# Patient Record
Sex: Female | Born: 1970 | Race: White | Hispanic: No
Health system: Southern US, Academic
[De-identification: ages and names within clinical notes are randomized; demographics above are authoritative.]

## PROBLEM LIST (undated history)

## (undated) ENCOUNTER — Emergency Department (HOSPITAL_BASED_OUTPATIENT_CLINIC_OR_DEPARTMENT_OTHER): Admission: EM | Payer: 59 | Source: Home / Self Care

## (undated) ENCOUNTER — Emergency Department: Discharge status: Absent without leave | Payer: Self-pay

## (undated) ENCOUNTER — Telehealth

## (undated) ENCOUNTER — Encounter

## (undated) ENCOUNTER — Encounter: Attending: Gastroenterology | Primary: Gastroenterology

## (undated) ENCOUNTER — Telehealth: Attending: Clinical | Primary: Clinical

## (undated) ENCOUNTER — Encounter
Attending: Student in an Organized Health Care Education/Training Program | Primary: Student in an Organized Health Care Education/Training Program

## (undated) ENCOUNTER — Ambulatory Visit: Payer: PRIVATE HEALTH INSURANCE

## (undated) ENCOUNTER — Ambulatory Visit

## (undated) ENCOUNTER — Encounter: Attending: Family Medicine | Primary: Family Medicine

## (undated) ENCOUNTER — Telehealth: Attending: Certified Registered" | Primary: Certified Registered"

## (undated) ENCOUNTER — Ambulatory Visit
Attending: Student in an Organized Health Care Education/Training Program | Primary: Student in an Organized Health Care Education/Training Program

## (undated) ENCOUNTER — Telehealth: Attending: Gastroenterology | Primary: Gastroenterology

## (undated) ENCOUNTER — Ambulatory Visit: Payer: PRIVATE HEALTH INSURANCE | Attending: Clinical | Primary: Clinical

## (undated) ENCOUNTER — Encounter: Attending: Internal Medicine | Primary: Internal Medicine

## (undated) ENCOUNTER — Inpatient Hospital Stay

## (undated) ENCOUNTER — Encounter: Attending: Certified Registered" | Primary: Certified Registered"

## (undated) ENCOUNTER — Ambulatory Visit: Payer: PRIVATE HEALTH INSURANCE | Attending: Psychiatry | Primary: Psychiatry

## (undated) ENCOUNTER — Ambulatory Visit
Payer: PRIVATE HEALTH INSURANCE | Attending: Student in an Organized Health Care Education/Training Program | Primary: Student in an Organized Health Care Education/Training Program

## (undated) ENCOUNTER — Institutional Professional Consult (permissible substitution): Payer: PRIVATE HEALTH INSURANCE

## (undated) ENCOUNTER — Ambulatory Visit: Attending: Gastroenterology | Primary: Gastroenterology

## (undated) ENCOUNTER — Encounter: Attending: Physician Assistant | Primary: Physician Assistant

## (undated) ENCOUNTER — Encounter: Attending: Psychologist | Primary: Psychologist

## (undated) ENCOUNTER — Telehealth: Attending: Psychiatry | Primary: Psychiatry

## (undated) ENCOUNTER — Encounter: Attending: Psychiatry | Primary: Psychiatry

## (undated) ENCOUNTER — Ambulatory Visit: Payer: PRIVATE HEALTH INSURANCE | Attending: Health Service | Primary: Health Service

## (undated) DIAGNOSIS — Z973 Presence of spectacles and contact lenses: Secondary | ICD-10-CM

## (undated) DIAGNOSIS — I10 Essential (primary) hypertension: Secondary | ICD-10-CM

## (undated) DIAGNOSIS — A499 Bacterial infection, unspecified: Secondary | ICD-10-CM

## (undated) DIAGNOSIS — Z951 Presence of aortocoronary bypass graft: Secondary | ICD-10-CM

## (undated) DIAGNOSIS — R32 Unspecified urinary incontinence: Secondary | ICD-10-CM

## (undated) DIAGNOSIS — M1990 Unspecified osteoarthritis, unspecified site: Secondary | ICD-10-CM

## (undated) DIAGNOSIS — I2510 Atherosclerotic heart disease of native coronary artery without angina pectoris: Secondary | ICD-10-CM

## (undated) DIAGNOSIS — M545 Low back pain, unspecified: Secondary | ICD-10-CM

## (undated) DIAGNOSIS — K219 Gastro-esophageal reflux disease without esophagitis: Secondary | ICD-10-CM

## (undated) DIAGNOSIS — Z9889 Other specified postprocedural states: Secondary | ICD-10-CM

## (undated) DIAGNOSIS — G8929 Other chronic pain: Secondary | ICD-10-CM

## (undated) DIAGNOSIS — K589 Irritable bowel syndrome without diarrhea: Secondary | ICD-10-CM

## (undated) DIAGNOSIS — R112 Nausea with vomiting, unspecified: Secondary | ICD-10-CM

## (undated) DIAGNOSIS — N390 Urinary tract infection, site not specified: Secondary | ICD-10-CM

## (undated) DIAGNOSIS — K743 Primary biliary cirrhosis: Secondary | ICD-10-CM

## (undated) DIAGNOSIS — R188 Other ascites: Secondary | ICD-10-CM

## (undated) DIAGNOSIS — E7849 Other hyperlipidemia: Secondary | ICD-10-CM

## (undated) HISTORY — PX: INCONTINENCE SURGERY: SHX676

## (undated) HISTORY — PX: LIVER BIOPSY: SHX301

## (undated) HISTORY — PX: LIVER TRANSPLANT: SHX410

## (undated) HISTORY — PX: WISDOM TOOTH EXTRACTION: SHX21

## (undated) MED ORDER — DEKAS ESSENTIAL 2,000 UNIT-2,000 UNIT-1,000 MCG CAPSULE: Freq: Every day | ORAL | 0.00000 days

## (undated) MED ORDER — PRALUENT PEN 150 MG/ML SUBCUTANEOUS PEN INJECTOR: SUBCUTANEOUS | 0 days

## (undated) MED ORDER — ACETAMINOPHEN 500 MG TABLET: Freq: Three times a day (TID) | ORAL | 0.00000 days | PRN

## (undated) MED ORDER — MIRTAZAPINE 30 MG TABLET: Freq: Every evening | ORAL | 0 days | Status: SS

## (undated) MED ORDER — OXYCODONE 5 MG CAPSULE: Freq: Every day | ORAL | 0 days | PRN

## (undated) MED ORDER — LORATADINE 10 MG TABLET: Freq: Every day | ORAL | 0.00000 days

## (undated) MED ORDER — ONDANSETRON 8 MG DISINTEGRATING TABLET: Freq: Three times a day (TID) | ORAL | 0 days | PRN

## (undated) MED ORDER — BIOTIN 5 MG CAPSULE: Freq: Every day | ORAL | 0.00000 days | Status: SS

## (undated) MED ORDER — CYCLOSPORINE 0.05 % EYE DROPS IN A DROPPERETTE: Freq: Every day | OPHTHALMIC | 0.00000 days

---

## 1998-02-04 ENCOUNTER — Inpatient Hospital Stay (HOSPITAL_COMMUNITY): Admission: AD | Admit: 1998-02-04 | Discharge: 1998-02-04 | Payer: Self-pay | Admitting: *Deleted

## 1998-04-21 ENCOUNTER — Inpatient Hospital Stay (HOSPITAL_COMMUNITY): Admission: AD | Admit: 1998-04-21 | Discharge: 1998-04-21 | Payer: Self-pay | Admitting: Obstetrics & Gynecology

## 1998-04-23 ENCOUNTER — Inpatient Hospital Stay (HOSPITAL_COMMUNITY): Admission: AD | Admit: 1998-04-23 | Discharge: 1998-04-25 | Payer: Self-pay | Admitting: *Deleted

## 1998-04-27 ENCOUNTER — Encounter (HOSPITAL_COMMUNITY): Admission: RE | Admit: 1998-04-27 | Discharge: 1998-07-26 | Payer: Self-pay | Admitting: *Deleted

## 1998-08-06 ENCOUNTER — Encounter (HOSPITAL_COMMUNITY): Admission: RE | Admit: 1998-08-06 | Discharge: 1998-11-04 | Payer: Self-pay | Admitting: *Deleted

## 1998-08-23 ENCOUNTER — Ambulatory Visit (HOSPITAL_COMMUNITY): Admission: RE | Admit: 1998-08-23 | Discharge: 1998-08-23 | Payer: Self-pay | Admitting: Orthopedic Surgery

## 1998-08-23 ENCOUNTER — Encounter: Payer: Self-pay | Admitting: Orthopedic Surgery

## 2000-06-04 ENCOUNTER — Encounter: Payer: Self-pay | Admitting: *Deleted

## 2000-06-04 ENCOUNTER — Encounter: Admission: RE | Admit: 2000-06-04 | Discharge: 2000-06-04 | Payer: Self-pay | Admitting: *Deleted

## 2000-12-07 ENCOUNTER — Encounter: Payer: Self-pay | Admitting: Otolaryngology

## 2000-12-07 ENCOUNTER — Ambulatory Visit (HOSPITAL_COMMUNITY): Admission: RE | Admit: 2000-12-07 | Discharge: 2000-12-07 | Payer: Self-pay | Admitting: Otolaryngology

## 2002-08-29 ENCOUNTER — Inpatient Hospital Stay (HOSPITAL_COMMUNITY): Admission: AD | Admit: 2002-08-29 | Discharge: 2002-09-01 | Payer: Self-pay | Admitting: *Deleted

## 2002-09-02 ENCOUNTER — Encounter: Admission: RE | Admit: 2002-09-02 | Discharge: 2002-10-02 | Payer: Self-pay | Admitting: Obstetrics and Gynecology

## 2002-10-03 ENCOUNTER — Encounter: Admission: RE | Admit: 2002-10-03 | Discharge: 2002-11-02 | Payer: Self-pay | Admitting: Obstetrics and Gynecology

## 2002-10-10 ENCOUNTER — Other Ambulatory Visit: Admission: RE | Admit: 2002-10-10 | Discharge: 2002-10-10 | Payer: Self-pay | Admitting: *Deleted

## 2002-11-03 ENCOUNTER — Encounter: Admission: RE | Admit: 2002-11-03 | Discharge: 2002-12-03 | Payer: Self-pay | Admitting: Obstetrics and Gynecology

## 2003-11-07 ENCOUNTER — Other Ambulatory Visit: Admission: RE | Admit: 2003-11-07 | Discharge: 2003-11-07 | Payer: Self-pay | Admitting: Obstetrics and Gynecology

## 2003-12-27 ENCOUNTER — Encounter: Admission: RE | Admit: 2003-12-27 | Discharge: 2003-12-27 | Payer: Self-pay | Admitting: Rheumatology

## 2003-12-31 ENCOUNTER — Ambulatory Visit (HOSPITAL_COMMUNITY): Admission: RE | Admit: 2003-12-31 | Discharge: 2003-12-31 | Payer: Self-pay | Admitting: Emergency Medicine

## 2004-01-15 ENCOUNTER — Emergency Department (HOSPITAL_COMMUNITY): Admission: EM | Admit: 2004-01-15 | Discharge: 2004-01-15 | Payer: Self-pay | Admitting: Emergency Medicine

## 2004-08-22 ENCOUNTER — Ambulatory Visit (HOSPITAL_COMMUNITY): Admission: RE | Admit: 2004-08-22 | Discharge: 2004-08-22 | Payer: Self-pay | Admitting: Gastroenterology

## 2004-08-22 ENCOUNTER — Encounter (INDEPENDENT_AMBULATORY_CARE_PROVIDER_SITE_OTHER): Payer: Self-pay | Admitting: *Deleted

## 2004-12-05 ENCOUNTER — Other Ambulatory Visit: Admission: RE | Admit: 2004-12-05 | Discharge: 2004-12-05 | Payer: Self-pay | Admitting: Obstetrics and Gynecology

## 2005-05-23 ENCOUNTER — Encounter: Admission: RE | Admit: 2005-05-23 | Discharge: 2005-05-23 | Payer: Self-pay | Admitting: Gastroenterology

## 2006-06-05 ENCOUNTER — Ambulatory Visit (HOSPITAL_BASED_OUTPATIENT_CLINIC_OR_DEPARTMENT_OTHER): Admission: RE | Admit: 2006-06-05 | Discharge: 2006-06-05 | Payer: Self-pay | Admitting: Rheumatology

## 2006-06-07 ENCOUNTER — Ambulatory Visit: Payer: Self-pay | Admitting: Internal Medicine

## 2006-07-14 ENCOUNTER — Encounter: Admission: RE | Admit: 2006-07-14 | Discharge: 2006-07-14 | Payer: Self-pay | Admitting: Family Medicine

## 2007-02-25 HISTORY — PX: INCONTINENCE SURGERY: SHX676

## 2007-09-16 ENCOUNTER — Inpatient Hospital Stay (HOSPITAL_COMMUNITY): Admission: RE | Admit: 2007-09-16 | Discharge: 2007-09-17 | Payer: Self-pay | Admitting: Obstetrics and Gynecology

## 2007-09-20 ENCOUNTER — Inpatient Hospital Stay (HOSPITAL_COMMUNITY): Admission: AD | Admit: 2007-09-20 | Discharge: 2007-09-21 | Payer: Self-pay | Admitting: Obstetrics and Gynecology

## 2008-02-15 ENCOUNTER — Emergency Department (HOSPITAL_COMMUNITY): Admission: EM | Admit: 2008-02-15 | Discharge: 2008-02-16 | Payer: Self-pay | Admitting: Emergency Medicine

## 2008-06-10 ENCOUNTER — Emergency Department (HOSPITAL_COMMUNITY): Admission: EM | Admit: 2008-06-10 | Discharge: 2008-06-10 | Payer: Self-pay | Admitting: Emergency Medicine

## 2008-06-14 ENCOUNTER — Ambulatory Visit (HOSPITAL_COMMUNITY): Admission: RE | Admit: 2008-06-14 | Discharge: 2008-06-14 | Payer: Self-pay | Admitting: Family Medicine

## 2010-04-19 ENCOUNTER — Other Ambulatory Visit: Payer: Self-pay | Admitting: Gastroenterology

## 2010-04-23 ENCOUNTER — Ambulatory Visit
Admission: RE | Admit: 2010-04-23 | Discharge: 2010-04-23 | Disposition: A | Discharge status: Home / Self Care | Payer: Managed Care, Other (non HMO) | Source: Ambulatory Visit | Attending: Gastroenterology | Admitting: Gastroenterology

## 2010-06-05 LAB — DIFFERENTIAL
Lymphocytes Relative: 20 % (ref 12–46)
Monocytes Absolute: 0.9 10*3/uL (ref 0.1–1.0)
Monocytes Relative: 8 % (ref 3–12)
Neutrophils Relative %: 71 % (ref 43–77)

## 2010-06-05 LAB — URINALYSIS, ROUTINE W REFLEX MICROSCOPIC
Ketones, ur: NEGATIVE mg/dL
Nitrite: NEGATIVE
Protein, ur: NEGATIVE mg/dL
Urobilinogen, UA: 1 mg/dL (ref 0.0–1.0)

## 2010-06-05 LAB — HEPATIC FUNCTION PANEL
AST: 82 U/L — ABNORMAL HIGH (ref 0–37)
Bilirubin, Direct: 0.5 mg/dL — ABNORMAL HIGH (ref 0.0–0.3)
Total Bilirubin: 1 mg/dL (ref 0.3–1.2)

## 2010-06-05 LAB — BASIC METABOLIC PANEL
CO2: 28 mEq/L (ref 19–32)
Chloride: 100 mEq/L (ref 96–112)
Potassium: 3.6 mEq/L (ref 3.5–5.1)

## 2010-06-05 LAB — CBC
MCHC: 35.4 g/dL (ref 30.0–36.0)
Platelets: 295 10*3/uL (ref 150–400)
RDW: 13 % (ref 11.5–15.5)
WBC: 10.6 10*3/uL — ABNORMAL HIGH (ref 4.0–10.5)

## 2010-06-05 LAB — HEPARIN LEVEL (UNFRACTIONATED): Heparin Unfractionated: <0.1 IU/mL — ABNORMAL LOW (ref 0.30–0.70)

## 2010-06-05 LAB — URINE CULTURE: Culture: NO GROWTH

## 2010-07-09 NOTE — Op Note (Signed)
NAMEFLYNN, LININGER                ACCOUNT NO.:  [id number]   MEDICAL RECORD NO.:  [id number]          PATIENT TYPE:  INP   LOCATION:  9318                          FACILITY:  [facility]   PHYSICIAN:  Martina Sinner, MD DATE OF BIRTH:  06/18/1970   DATE OF PROCEDURE:  09/16/2007  DATE OF DISCHARGE:                               OPERATIVE REPORT   PREOPERATIVE DIAGNOSIS:  Stress urinary continence.   POSTOPERATIVE DIAGNOSIS:  Stress urinary continence.   SURGERY:  Sling cystourethropexy [facility]) plus cystoscopy.   SURGEON:  Scott A. MacDiarmid, MD.   Threasa HeadsStann Mainland. Vincente Poli, MD.   [name] has stress urinary continence.  She was booked to have a  sling plus rectocele repair.  Rectocele repair was performed following  the sling by Dr. Marcelle Overlie.   The patient was placed in lithotomy position.  Extra care was taken to  minimize the risk of compartment syndrome, neuropathy, and DVT.  She was  given preoperative antibiotics.  Preoperative laboratory tests were  normal.  A weighted vaginal speculum, Kerr cerebellar retractor, and  Foley catheter was used for exposure.  Two 1-cm incisions were made 1.5  cm lateral to the midline and 1 fingerbreadth above the symphysis pubis.  I made a 2-cm incision overlying the mid urethra.  I used a lidocaine  and epinephrine mixture to place submucosally to make certain I could  make a deep incision.  I sharply dissected with Metzenbaum through  urethrovesical angle bilaterally.   With the bladder empty, I passed the [facility] needle on top of along the  back of symphysis pubis onto the pulp of my index finger bilaterally.   I then cystoscoped the patient.  There was no injury to the bladder or  urethra or ureters.  There was bilateral efflux.  She did have a mild  cystocele.  There was no movement of the bladder wall with wiggling of  the trocars.  I took extra time to inspect the full bladder when it was  full.   With the bladder  empty, I attached the sling and brought up through the  retropubic space and tensioned it over the fat part of the moderate-  sized Kelly clamp.  I cut below the blue dots, irrigated the sheath, and  removed the sheaths.   I was very happy with the position and tension of the sling.  There was  hypermobility.  I closed the incision with running 2-0 Vicryl followed  by 2 interrupted sutures.   Copious irrigation was utilized.  The sling was cut below the skin level  and closed with 4-0 Vicryl and Dermabond.   The rectocele repair was then performed by Dr. Marcelle Overlie.   Hopefully, Ms. Vanengen will meet her treatment goal with the sling.           ______________________________  Martina Sinner, MD  Electronically Signed     SAM/MEDQ  D:  09/16/2007  T:  09/16/2007  Job:  [id number]

## 2010-07-09 NOTE — Op Note (Signed)
NAMETAQUILA, Christine Butler                ACCOUNT NO.:  [id number]   MEDICAL RECORD NO.:  [id number]          PATIENT TYPE:  INP   LOCATION:  9318                          FACILITY:  [facility]   PHYSICIAN:  Michelle L. Grewal, M.D.DATE OF BIRTH:  24-Feb-1971   DATE OF PROCEDURE:  09/16/2007  DATE OF DISCHARGE:                               OPERATIVE REPORT   PREOPERATIVE DIAGNOSIS:  Symptomatic rectocele and stress incontinence.   POSTOPERATIVE DIAGNOSIS:  Symptomatic rectocele and stress incontinence.   PROCEDURE:  Posterior repair perineoplasty.   SURGEON:  Michelle L. Vincente Poli, MD   ASSISTANT:  Martina Sinner, MD   ANESTHESIA:  General.   FINDINGS:  Rectocele.   SPECIMENS:  None.   PATHOLOGY:  Not applicable.   ESTIMATED BLOOD LOSS:  About 50 mL.   PROCEDURE:  The patient was taken to the operating room after informed  consent was obtained.  She was prepped and draped in the usual sterile  fashion.  Dr. Sherron Monday performed a SPARC sling and cystoscopy and at  the conclusion of that I then switched places with him and noted the  patient had a grade 2 rectocele.  She also had  some relaxation of  perineum.  I placed the Allis clamps at 5 and 7 o'clock, made a V-shaped  incision with scalpel, and made a midline incision up to posterior wall  of the vagina with Metzenbaum scissors.  I dissected the rectovaginal  fascia free and then reapproximated rectovaginal fascia in the midline  and reduced the rectocele using interrupted using 0 Vicryl suture.  I  then trimmed the redundant vaginal epithelium and then closed the  vaginal epithelium and the perineum, was reapproximated using 0 Vicryl  and a running stitch.  Vaginal packing with  Estrace cream was inserted  into the vagina.  All sponge, lap, and instrument counts were correct  x2.  The patient went to the recovery room in stable condition.  She  will be kept overnight for observation.      Michelle L. Vincente Poli, M.D.  Electronically Signed     MLG/MEDQ  D:  09/16/2007  T:  09/16/2007  Job:  [id number]

## 2010-07-12 NOTE — Op Note (Signed)
   NAMEPORFIRIA, HEINRICH                          ACCOUNT NO.:  [id number]   MEDICAL RECORD NO.:  [id number]                   PATIENT TYPE:  INP   LOCATION:  9164                                 FACILITY:  [facility]   PHYSICIAN:  Duke Salvia. Marcelle Overlie, M.D.            DATE OF BIRTH:  Jul 25, 1970   DATE OF PROCEDURE:  08/30/2002  DATE OF DISCHARGE:                                 OPERATIVE REPORT   OBSTETRICIAN:  Duke Salvia. Marcelle Overlie, M.D.   DESCRIPTION OF PROCEDURE:  The patient had a short second stage with deep  variables. She was known to be +3 and starting to crown with good epidural  anesthesia, straight OA with the fetal heart rate staying in the 60-70  range. Decision made to proceed with VE assisted delivery. The Kiwi VE was  applied. Traction effort coordinated with maternal pushing x1 to effect the  easy delivery of a female.  APGARS 9/9, pH is pending. Placenta delivered  spontaneously intact. Pitocin was given. At that point, EBL was 350.   There was small right periurethral laceration and a small second degree  perineal tear repaired with 3-0 Vicryl Rapide. Mother and baby doing well at  that point.                                               Richard M. Marcelle Overlie, M.D.    RMH/MEDQ  D:  08/30/2002  T:  08/30/2002  Job:  [id number]

## 2010-07-12 NOTE — Procedures (Signed)
Christine Butler, Christine Butler                ACCOUNT NO.:  [id number]   MEDICAL RECORD NO.:  [id number]          PATIENT TYPE:  OUT   LOCATION:  SLEEP CENTER                 FACILITY:  [facility]   PHYSICIAN:  Clinton D. Maple Hudson, MD, FCCP, FACPDATE OF BIRTH:  [birth year]1970   DATE OF STUDY:  06/05/2006                            NOCTURNAL POLYSOMNOGRAM   REFERRING PHYSICIAN:  Pollyann Savoy, M.D.   INDICATION FOR STUDY:  Insomnia with sleep apnea.   EPWORTH SLEEPINESS SCORE:  12/24, BMI 30.8, weight 170 pounds.   MEDICATIONS:  Medication list reviewed and charted.   SLEEP ARCHITECTURE:  Total sleep time 373 minutes with sleep efficiency  96%.  Stage I was 4%, stage II 71%, stages III and IV 8%, REM 17% of  total sleep time.  Sleep latency 8 minutes, REM latency 78 minutes,  awake after sleep onset 10 minutes, arousal index 4.7.  Rozerem was  taken at 10:50 p.m., Actigall taken at 9:15 p.m.   RESPIRATORY DATA:  Apnea-hypopnea index (AHI, RDI) 0 per hour.  There  were no scored respiratory disturbance events.   OXYGEN DATA:  Mild occasional snoring with oxygen desaturation to a  nadir of 93%.  Mean oxygen saturation through the study was 97% on room  air.   CARDIAC DATA:  Sinus rhythm with mild sinus arrhythmia.   MOVEMENT-PARASOMNIA:  Occasional limb jerk with little effect on sleep.  She slept mostly on left and right side.   IMPRESSIONS-RECOMMENDATIONS:  1. Unremarkable sleep architecture for sleep center environment, after      Rozerem taken at 10:50 p.m., with sleep onset at 11:14 p.m. (lights      out 11:05 p.m.).  2. No sleep disordered breathing events, mild occasional snoring and      oxygen desaturation to a nadir of 93%.  3. Mild occasional sinus arrhythmia, otherwise normal sinus rhythm on      EKG.     Clinton D. Maple Hudson, MD, [facility], FACP  Diplomate, Biomedical engineer of Sleep Medicine  Electronically Signed    CDY/MEDQ  D:  06/07/2006 10:23:37  T:  06/07/2006 11:18:57  Job:   [id number]

## 2010-11-22 LAB — DIFFERENTIAL
Basophils Absolute: 0
Basophils Relative: 1
Monocytes Relative: 9
Neutro Abs: 4.9
Neutrophils Relative %: 61

## 2010-11-22 LAB — TYPE AND SCREEN
ABO/RH(D): A POS
Antibody Screen: NEGATIVE

## 2010-11-22 LAB — URINALYSIS, ROUTINE W REFLEX MICROSCOPIC
Glucose, UA: NEGATIVE
Hgb urine dipstick: NEGATIVE
Ketones, ur: NEGATIVE
pH: 7

## 2010-11-22 LAB — CBC
HCT: 33.2 — ABNORMAL LOW
Hemoglobin: 11.3 — ABNORMAL LOW
Hemoglobin: 14.8
MCHC: 34.1
MCHC: 34.6
MCV: 93.1
RBC: 3.57 — ABNORMAL LOW
RBC: 3.88
RBC: 4.7
RDW: 12.5
WBC: 5.9
WBC: 9.4

## 2010-11-22 LAB — URINE CULTURE
Colony Count: NO GROWTH
Culture: NO GROWTH

## 2010-11-22 LAB — APTT: aPTT: 28

## 2010-11-22 LAB — ABO/RH: ABO/RH(D): A POS

## 2010-11-29 LAB — POCT CARDIAC MARKERS
CKMB, poc: 1.5 ng/mL (ref 1.0–8.0)
CKMB, poc: <1 ng/mL — ABNORMAL LOW (ref 1.0–8.0)
Myoglobin, poc: 59 ng/mL (ref 12–200)
Troponin i, poc: <0.05 ng/mL (ref 0.00–0.09)

## 2010-11-29 LAB — BASIC METABOLIC PANEL
GFR calc non Af Amer: >60 mL/min (ref >60)
Potassium: 3.3 mEq/L — ABNORMAL LOW (ref 3.5–5.1)
Sodium: 139 mEq/L (ref 135–145)

## 2010-11-29 LAB — CBC
HCT: 39.2 % (ref 36.0–46.0)
Hemoglobin: 13.6 g/dL (ref 12.0–15.0)
RBC: 4.34 MIL/uL (ref 3.87–5.11)
WBC: 7.9 10*3/uL (ref 4.0–10.5)

## 2010-11-29 LAB — DIFFERENTIAL
Eosinophils Relative: 2 % (ref 0–5)
Lymphocytes Relative: 31 % (ref 12–46)
Lymphs Abs: 2.4 10*3/uL (ref 0.7–4.0)
Monocytes Absolute: 0.6 10*3/uL (ref 0.1–1.0)

## 2010-12-19 ENCOUNTER — Other Ambulatory Visit: Payer: Self-pay | Admitting: Obstetrics and Gynecology

## 2011-06-24 ENCOUNTER — Other Ambulatory Visit: Payer: Self-pay | Admitting: Obstetrics and Gynecology

## 2011-08-27 ENCOUNTER — Encounter (HOSPITAL_COMMUNITY): Payer: Self-pay | Admitting: Emergency Medicine

## 2011-08-27 ENCOUNTER — Emergency Department (HOSPITAL_COMMUNITY)
Admission: EM | Admit: 2011-08-27 | Discharge: 2011-08-27 | Disposition: A | Discharge status: Home / Self Care | Payer: Managed Care, Other (non HMO) | Attending: Emergency Medicine | Admitting: Emergency Medicine

## 2011-08-27 DIAGNOSIS — K922 Gastrointestinal hemorrhage, unspecified: Secondary | ICD-10-CM | POA: Insufficient documentation

## 2011-08-27 HISTORY — DX: Primary biliary cirrhosis: K74.3

## 2011-08-27 HISTORY — DX: Irritable bowel syndrome, unspecified: K58.9

## 2011-08-27 LAB — CBC WITH DIFFERENTIAL/PLATELET
Basophils Relative: 0 % (ref 0–1)
Eosinophils Absolute: 0.1 10*3/uL (ref 0.0–0.7)
Eosinophils Relative: 2 % (ref 0–5)
Hemoglobin: 13.2 g/dL (ref 12.0–15.0)
MCH: 32 pg (ref 26.0–34.0)
MCHC: 34.7 g/dL (ref 30.0–36.0)
MCV: 92 fL (ref 78.0–100.0)
Monocytes Relative: 9 % (ref 3–12)
Neutrophils Relative %: 44 % (ref 43–77)

## 2011-08-27 LAB — COMPREHENSIVE METABOLIC PANEL
Albumin: 3.7 g/dL (ref 3.5–5.2)
Alkaline Phosphatase: 495 U/L — ABNORMAL HIGH (ref 39–117)
BUN: 9 mg/dL (ref 6–23)
Calcium: 9.7 mg/dL (ref 8.4–10.5)
Creatinine, Ser: 0.48 mg/dL — ABNORMAL LOW (ref 0.50–1.10)
GFR calc Af Amer: >90 mL/min (ref >90)
Potassium: 3.7 mEq/L (ref 3.5–5.1)
Total Protein: 8.4 g/dL — ABNORMAL HIGH (ref 6.0–8.3)

## 2011-08-27 LAB — OCCULT BLOOD, POC DEVICE: Fecal Occult Bld: NEGATIVE

## 2011-08-27 MED ORDER — FAMOTIDINE 20 MG PO TABS
20.0000 mg | ORAL_TABLET | Freq: Once | ORAL | Status: AC
  Administered 2011-08-27: 20 mg via ORAL
  Filled 2011-08-27: qty 1

## 2011-08-27 NOTE — ED Provider Notes (Signed)
History     CSN: [id number]  Arrival date & time 08/27/11  1527   First MD Initiated Contact with Patient 08/27/11 1607      Chief Complaint  Patient presents with  . Abdominal Pain    (Consider location/radiation/quality/duration/timing/severity/associated sxs/prior treatment) HPI Comments: Hx of Primary Biliary Cirrhosis - under care of Dr. Madilyn Fireman - chronic transaminitis - has IBS since childhood - has frequent loose stools at baseline with cramping.  Stools over last few days have been loose to normal, tinged with blood and now with dark "sticky" stools.  Sx are intermittent, persistent over 2 days, and not associated with f/c/n/v but has some epigastric pain, dec appetite.  Deniesw light headedness and SOB.  No rashes, no dysuria.  No pain with BM's.  NSAIDs q week as needed.  On actagol for PBC.    Patient is a 41 y.o. female presenting with abdominal pain. The history is provided by the patient and medical records.  Abdominal Pain The primary symptoms of the illness include abdominal pain.    Past Medical History  Diagnosis Date  . IBS (irritable bowel syndrome)   . Primary biliary cirrhosis     Past Surgical History  Procedure Date  . Incontinence surgery     No family history on file.  History  Substance Use Topics  . Smoking status: Never Smoker   . Smokeless tobacco: Not on file  . Alcohol Use: 0.6 oz/week    1 Glasses of wine per week     for dinner    OB History    Grav Para Term Preterm Abortions TAB SAB Ect Mult Living                  Review of Systems  Gastrointestinal: Positive for abdominal pain.  All other systems reviewed and are negative.    Allergies  Codeine and Erythromycin  Home Medications   Current Outpatient Rx  Name Route Sig Dispense Refill  . CALCIUM + D PO Oral Take 1 tablet by mouth daily.    . OMEGA-3 FATTY ACIDS 1000 MG PO CAPS Oral Take 1 g by mouth daily.    . IBUPROFEN 200 MG PO TABS Oral Take 600 mg by mouth every 8  (eight) hours as needed. For pain.    . ADULT MULTIVITAMIN W/MINERALS CH Oral Take 1 tablet by mouth daily.    Marland Kitchen URSODIOL 300 MG PO CAPS Oral Take 600-900 mg by mouth 2 (two) times daily. 3 cap in am, 2 cap in pm    . VITAMIN B-12 1000 MCG PO TABS Oral Take 1,000 mcg by mouth daily.      BP 122/75  Pulse 74  Temp 98.4 F (36.9 C) (Oral)  Resp 18  SpO2 100%  LMP 08/19/2011  Physical Exam  Nursing note and vitals reviewed. Constitutional: She appears well-developed and well-nourished. No distress.  HENT:  Head: Normocephalic and atraumatic.  Mouth/Throat: Oropharynx is clear and moist. No oropharyngeal exudate.  Eyes: Conjunctivae and EOM are normal. Pupils are equal, round, and reactive to light. Right eye exhibits no discharge. Left eye exhibits no discharge. No scleral icterus.       No  icterus  Neck: Normal range of motion. Neck supple. No JVD present. No thyromegaly present.  Cardiovascular: Normal rate, regular rhythm, normal heart sounds and intact distal pulses.  Exam reveals no gallop and no friction rub.   No murmur heard.      Normal capillary refill, normal peripheral  pulses at the radial arteries  Pulmonary/Chest: Effort normal and breath sounds normal. No respiratory distress. She has no wheezes. She has no rales.  Abdominal: Soft. Bowel sounds are normal. She exhibits no distension and no mass. There is tenderness ( Focal tenderness to palpation in the suprapubic and left lower quadrant, this is mild, no associated guarding or rebound. No tenderness in the right lower quadrant, normal epigastric tenderness, no right upper quadrant tenderness).  Musculoskeletal: Normal range of motion. She exhibits no edema and no tenderness.  Lymphadenopathy:    She has no cervical adenopathy.  Neurological: She is alert. Coordination normal.  Skin: Skin is warm and dry. No rash noted. No erythema.  Psychiatric: She has a normal mood and affect. Her behavior is normal.    ED Course    Procedures (including critical care time)  Labs Reviewed  COMPREHENSIVE METABOLIC PANEL - Abnormal; Notable for the following:    Glucose, Bld 106 (*)     Creatinine, Ser 0.48 (*)     Total Protein 8.4 (*)     AST 86 (*)     ALT 110 (*)     Alkaline Phosphatase 495 (*)     All other components within normal limits  CBC WITH DIFFERENTIAL  OCCULT BLOOD, POC DEVICE  OCCULT BLOOD X 1 CARD TO LAB, STOOL   No results found.   1. GI bleed       MDM  Well appearing with normal vital signs, mild lower abdominal tenderness with a gastrointestinal bleed by history. Hemoccult pending, check CBC, comprehensive metabolic panel given history of primary biliary cirrhosis.  5:10 PM, rectal exam with chaperone present, no gross blood, no signs of hemorrhoids or anal fissure, no masses palpated. Minimal stool in the rectal vault, occult sent   Hemoccult-negative, labs showing no anemia, mild LFT elevation consistent with what patient states he is normal for her. Referred back to gastroenterology for ongoing outpatient evaluation of this problem. At this time the patient appears hemodynamically stable     Vida Roller, MD 08/27/11 [phone]

## 2011-08-27 NOTE — ED Notes (Signed)
Pt stated that abdominal pain and cramping started yesterday. Pt noticed that stool was dark and toilet water was pink. No constipation and diarrhea. Pt has hx IBS and usually has loose stools. Pt had nausea and no appetite. No vomiting.  Pt complaints of epigastric pain that is chronic. Pt did not take any medications for pain. Pt took MV, calcium, B 12, Fish oil, and actigall.

## 2011-08-27 NOTE — ED Notes (Signed)
"  I'm feeling fine".

## 2011-10-28 ENCOUNTER — Other Ambulatory Visit: Payer: Self-pay | Admitting: Obstetrics and Gynecology

## 2012-01-28 ENCOUNTER — Emergency Department (HOSPITAL_BASED_OUTPATIENT_CLINIC_OR_DEPARTMENT_OTHER)
Admission: EM | Admit: 2012-01-28 | Discharge: 2012-01-28 | Disposition: A | Discharge status: Home / Self Care | Payer: Managed Care, Other (non HMO) | Attending: Emergency Medicine | Admitting: Emergency Medicine

## 2012-01-28 ENCOUNTER — Encounter (HOSPITAL_BASED_OUTPATIENT_CLINIC_OR_DEPARTMENT_OTHER): Payer: Self-pay | Admitting: Emergency Medicine

## 2012-01-28 ENCOUNTER — Emergency Department (HOSPITAL_BASED_OUTPATIENT_CLINIC_OR_DEPARTMENT_OTHER): Payer: Managed Care, Other (non HMO)

## 2012-01-28 DIAGNOSIS — R059 Cough, unspecified: Secondary | ICD-10-CM | POA: Insufficient documentation

## 2012-01-28 DIAGNOSIS — R0602 Shortness of breath: Secondary | ICD-10-CM | POA: Insufficient documentation

## 2012-01-28 DIAGNOSIS — R0789 Other chest pain: Secondary | ICD-10-CM | POA: Diagnosis present

## 2012-01-28 DIAGNOSIS — K743 Primary biliary cirrhosis: Secondary | ICD-10-CM | POA: Insufficient documentation

## 2012-01-28 DIAGNOSIS — K745 Biliary cirrhosis, unspecified: Secondary | ICD-10-CM | POA: Insufficient documentation

## 2012-01-28 DIAGNOSIS — R05 Cough: Secondary | ICD-10-CM | POA: Insufficient documentation

## 2012-01-28 DIAGNOSIS — K589 Irritable bowel syndrome without diarrhea: Secondary | ICD-10-CM | POA: Insufficient documentation

## 2012-01-28 DIAGNOSIS — Z79899 Other long term (current) drug therapy: Secondary | ICD-10-CM | POA: Insufficient documentation

## 2012-01-28 DIAGNOSIS — K219 Gastro-esophageal reflux disease without esophagitis: Secondary | ICD-10-CM | POA: Insufficient documentation

## 2012-01-28 LAB — CBC WITH DIFFERENTIAL/PLATELET
Eosinophils Relative: 2 % (ref 0–5)
HCT: 35.3 % — ABNORMAL LOW (ref 36.0–46.0)
Hemoglobin: 12.5 g/dL (ref 12.0–15.0)
Lymphocytes Relative: 16 % (ref 12–46)
Lymphs Abs: 1.6 10*3/uL (ref 0.7–4.0)
MCV: 91 fL (ref 78.0–100.0)
Monocytes Absolute: 0.8 10*3/uL (ref 0.1–1.0)
Neutro Abs: 7.3 10*3/uL (ref 1.7–7.7)
RBC: 3.88 MIL/uL (ref 3.87–5.11)
WBC: 9.9 10*3/uL (ref 4.0–10.5)

## 2012-01-28 LAB — BASIC METABOLIC PANEL
CO2: 25 mEq/L (ref 19–32)
Chloride: 104 mEq/L (ref 96–112)
GFR calc Af Amer: >90 mL/min (ref >90)
Potassium: 3.5 mEq/L (ref 3.5–5.1)
Sodium: 138 mEq/L (ref 135–145)

## 2012-01-28 MED ORDER — OMEPRAZOLE 20 MG PO CPDR: 20.0000 mg | DELAYED_RELEASE_CAPSULE | Freq: Every day | ORAL | Status: DC

## 2012-01-28 MED ORDER — PANTOPRAZOLE SODIUM 40 MG PO TBEC
40.0000 mg | DELAYED_RELEASE_TABLET | Freq: Once | ORAL | Status: AC
  Administered 2012-01-28: 40 mg via ORAL
  Filled 2012-01-28: qty 1

## 2012-01-28 MED ORDER — GI COCKTAIL ~~LOC~~
30.0000 mL | Freq: Once | ORAL | Status: AC
  Administered 2012-01-28: 30 mL via ORAL
  Filled 2012-01-28: qty 30

## 2012-01-28 NOTE — ED Notes (Signed)
MD at bedside. 

## 2012-01-28 NOTE — ED Provider Notes (Signed)
History     CSN: [id number]  Arrival date & time 01/28/12  4098   First MD Initiated Contact with Patient 01/28/12 0532      Chief Complaint  Patient presents with  . Chest Pain    (Consider location/radiation/quality/duration/timing/severity/associated sxs/prior treatment) HPI  41yo F Pt reports awaking at 4am with heaviness across her chest and feeling like "I can't get a good breath in". She states that she got out of bed and walked through the house hoping this would improve her sx, but it did not. When she takes a deep breath, she has to cough. She states that the pressure improves when sitting up at an angle. She had a similar episode in 12/09. A CTA was negative, but her d-dimer was mildly elevated. She was referred to Cardiology but all of her tests were negative. She later had a another episode of similar symptoms, and a repeat CTA on 06/14/08 revealed an extensive LUL pneumonia. She does have a h/o reflux and endorses eating pizza last night.   Past Medical History  Diagnosis Date  . IBS (irritable bowel syndrome)   . Primary biliary cirrhosis     Past Surgical History  Procedure Date  . Incontinence surgery     No family history on file.  History  Substance Use Topics  . Smoking status: Never Smoker   . Smokeless tobacco: Not on file  . Alcohol Use: 0.6 oz/week    1 Glasses of wine per week     Comment: for dinner    OB History    Grav Para Term Preterm Abortions TAB SAB Ect Mult Living                  Review of Systems  Respiratory: Positive for cough and shortness of breath.        Chest pressure, esp in left upper chest  Cardiovascular: Negative for leg swelling.  All other systems reviewed and are negative.    Allergies  Codeine and Erythromycin  Home Medications   Current Outpatient Rx  Name  Route  Sig  Dispense  Refill  . CALCIUM + D PO   Oral   Take 1 tablet by mouth daily.         . OMEGA-3 FATTY ACIDS 1000 MG PO CAPS   Oral  Take 1 g by mouth daily.         . IBUPROFEN 200 MG PO TABS   Oral   Take 600 mg by mouth every 8 (eight) hours as needed. For pain.         . ADULT MULTIVITAMIN W/MINERALS CH   Oral   Take 1 tablet by mouth daily.         Marland Kitchen URSODIOL 300 MG PO CAPS   Oral   Take 600-900 mg by mouth 2 (two) times daily. 3 cap in am, 2 cap in pm         . VITAMIN B-12 1000 MCG PO TABS   Oral   Take 1,000 mcg by mouth daily.           BP 127/81  Temp 98.4 F (36.9 C) (Oral)  Resp 18  Ht 5\' 2"  (1.575 m)  Wt 145 lb (65.772 kg)  BMI 26.52 kg/m2  SpO2 99%  Physical Exam  Constitutional: She is oriented to person, place, and time. She appears well-developed and well-nourished.  HENT:  Head: Normocephalic and atraumatic.  Eyes: Pupils are equal, round, and reactive to light.  Cardiovascular: Regular rhythm.        Tachycardic  Pulmonary/Chest: Effort normal and breath sounds normal. No respiratory distress. She has no wheezes. She exhibits no tenderness.       Clear to ausculation bilaterally  Abdominal: Soft.  Neurological: She is alert and oriented to person, place, and time.  Skin: Skin is warm.  Psychiatric: She has a normal mood and affect. Her behavior is normal.    ED Course  Procedures (including critical care time)   Date: 12/042013  Rate: 92  Rhythm: normal sinus rhythm  QRS Axis: normal  Intervals: normal  ST/T Wave abnormalities: normal  Conduction Disutrbances: none  Narrative Interpretation: Normal EKG    Labs Reviewed  CBC WITH DIFFERENTIAL - Abnormal; Notable for the following:    HCT 35.3 (*)     All other components within normal limits  BASIC METABOLIC PANEL  TROPONIN I   No results found.   No diagnosis found.    MDM    Given her atypical symptoms and negative EKG, ACS is less likely. Trending troponin levels. With similar symptoms in the past, PE ruled out despite mildly elevated d-dimer. Will recheck d-dimer, but suspicion for PE is low  given that she does not smoke or use BCP; her PERC score is negative. Her symptoms could be related to reflux. While she endorses reflux in the past, she states that her current symptoms are different from her reflux symptoms; ordering GI cocktail. With her previous h/o pneumonia, a CXR was ordered and is normal.  Symptoms likely 2/2 reflux. Giving Protonix in the ED. Pt to start Zantac 150mg  qd x2 mo tomorrow.        Genelle Gather, MD 01/28/12 [phone]

## 2012-01-28 NOTE — ED Notes (Signed)
Pt reports awaking at 4am with heaviness across chest and feels like "I can't get a good breath in"

## 2012-01-28 NOTE — ED Provider Notes (Signed)
I saw and evaluated the patient, reviewed the resident's note and I agree with the findings and plan.   .Face to face Exam:  General:  Awake HEENT:  Atraumatic Resp:  Normal effort Abd:  Nondistended Neuro:No focal weakness Lymph: No adenopathy   Nelia Shi, MD 01/28/12 [phone]

## 2012-07-26 DIAGNOSIS — K743 Primary biliary cirrhosis: Secondary | ICD-10-CM

## 2012-07-26 HISTORY — DX: Primary biliary cirrhosis: K74.3

## 2012-08-10 ENCOUNTER — Other Ambulatory Visit (HOSPITAL_COMMUNITY): Payer: Self-pay | Admitting: Gastroenterology

## 2012-08-10 DIAGNOSIS — K745 Biliary cirrhosis, unspecified: Secondary | ICD-10-CM

## 2012-08-11 ENCOUNTER — Other Ambulatory Visit: Payer: Self-pay | Admitting: Radiology

## 2012-08-12 ENCOUNTER — Encounter (HOSPITAL_COMMUNITY): Payer: Self-pay | Admitting: Pharmacy Technician

## 2012-08-19 ENCOUNTER — Ambulatory Visit (HOSPITAL_COMMUNITY)
Admission: RE | Admit: 2012-08-19 | Discharge: 2012-08-19 | Disposition: A | Discharge status: Home / Self Care | Payer: 59 | Source: Ambulatory Visit | Attending: Gastroenterology | Admitting: Gastroenterology

## 2012-08-19 ENCOUNTER — Encounter (HOSPITAL_COMMUNITY): Payer: Self-pay

## 2012-08-19 DIAGNOSIS — K738 Other chronic hepatitis, not elsewhere classified: Secondary | ICD-10-CM | POA: Insufficient documentation

## 2012-08-19 DIAGNOSIS — K745 Biliary cirrhosis, unspecified: Secondary | ICD-10-CM | POA: Insufficient documentation

## 2012-08-19 DIAGNOSIS — K589 Irritable bowel syndrome without diarrhea: Secondary | ICD-10-CM | POA: Insufficient documentation

## 2012-08-19 LAB — CBC
HCT: 37.1 % (ref 36.0–46.0)
MCV: 91.2 fL (ref 78.0–100.0)
RBC: 4.07 MIL/uL (ref 3.87–5.11)
WBC: 6.6 10*3/uL (ref 4.0–10.5)

## 2012-08-19 LAB — HCG, SERUM, QUALITATIVE: Preg, Serum: NEGATIVE

## 2012-08-19 LAB — PROTIME-INR: INR: 0.89 (ref 0.00–1.49)

## 2012-08-19 MED ORDER — PROMETHAZINE HCL 25 MG/ML IJ SOLN
INTRAMUSCULAR | Status: AC
  Filled 2012-08-19: qty 1

## 2012-08-19 MED ORDER — FENTANYL CITRATE 0.05 MG/ML IJ SOLN
INTRAMUSCULAR | Status: AC
  Filled 2012-08-19: qty 4

## 2012-08-19 MED ORDER — MIDAZOLAM HCL 2 MG/2ML IJ SOLN
INTRAMUSCULAR | Status: AC | PRN
  Administered 2012-08-19: 2 mg via INTRAVENOUS

## 2012-08-19 MED ORDER — PROMETHAZINE HCL 25 MG/ML IJ SOLN
INTRAMUSCULAR | Status: AC
  Administered 2012-08-19: 25 mg via INTRAVENOUS
  Filled 2012-08-19: qty 1

## 2012-08-19 MED ORDER — SODIUM CHLORIDE 0.9 % IV SOLN
Freq: Once | INTRAVENOUS | Status: AC
  Administered 2012-08-19: 13:00:00 via INTRAVENOUS

## 2012-08-19 MED ORDER — PROMETHAZINE HCL 25 MG/ML IJ SOLN
12.5000 mg | INTRAMUSCULAR | Status: DC | PRN
  Administered 2012-08-19: 12.5 mg via INTRAVENOUS
  Filled 2012-08-19: qty 1

## 2012-08-19 MED ORDER — PROMETHAZINE HCL 25 MG/ML IJ SOLN
12.5000 mg | Freq: Once | INTRAMUSCULAR | Status: AC
  Administered 2012-08-19: 25 mg via INTRAVENOUS
  Filled 2012-08-19: qty 1

## 2012-08-19 MED ORDER — FENTANYL CITRATE 0.05 MG/ML IJ SOLN
INTRAMUSCULAR | Status: AC | PRN
  Administered 2012-08-19: 50 ug via INTRAVENOUS

## 2012-08-19 MED ORDER — ONDANSETRON 8 MG/NS 50 ML IVPB
8.0000 mg | Freq: Once | INTRAVENOUS | Status: AC
  Administered 2012-08-19: 8 mg via INTRAVENOUS
  Filled 2012-08-19: qty 8

## 2012-08-19 MED ORDER — LACTATED RINGERS IV SOLN
Freq: Once | INTRAVENOUS | Status: AC
  Administered 2012-08-19: 18:00:00 via INTRAVENOUS

## 2012-08-19 MED ORDER — HYDROMORPHONE HCL PF 1 MG/ML IJ SOLN
1.0000 mg | INTRAMUSCULAR | Status: DC | PRN
  Filled 2012-08-19: qty 1

## 2012-08-19 MED ORDER — MIDAZOLAM HCL 2 MG/2ML IJ SOLN
INTRAMUSCULAR | Status: AC
  Filled 2012-08-19: qty 4

## 2012-08-19 NOTE — H&P (Signed)
Agree 

## 2012-08-19 NOTE — Progress Notes (Signed)
Pt received from radiology. Transferred to bed. Pt began vomiting and co severe nausea.  Dr. Archer Asa notified. Orders received.  Pt given Zofran and LR bolus.  Will Speak with Dr. Archer Asa again at 1845.

## 2012-08-19 NOTE — H&P (Signed)
Christine Butler is an 42 y.o. female.   Chief Complaint: Primary Biliary Cirrhosis since 2007 New elevation of liver function tests Scheduled now for liver core biopsy Pt has hx N/V with sedation- will pre medicate with Phenergan IV HPI: PBC; IBS  Past Medical History  Diagnosis Date  . IBS (irritable bowel syndrome)   . Primary biliary cirrhosis     Past Surgical History  Procedure Laterality Date  . Incontinence surgery      No family history on file. Social History:  reports that she has never smoked. She does not have any smokeless tobacco history on file. She reports that she drinks about 0.6 ounces of alcohol per week. She reports that she does not use illicit drugs.  Allergies:  Allergies  Allergen Reactions  . Codeine Nausea And Vomiting  . Erythromycin Nausea And Vomiting     (Not in a hospital admission)  No results found for this or any previous visit (from the past 48 hour(s)). No results found.  Review of Systems  Constitutional: Negative for fever and weight loss.  Respiratory: Negative for shortness of breath.   Cardiovascular: Negative for chest pain.  Gastrointestinal: Negative for nausea, vomiting and abdominal pain.  Musculoskeletal: Negative for myalgias.  Neurological: Negative for dizziness, weakness and headaches.    Blood pressure 127/85, pulse 75, temperature 98.2 F (36.8 C), temperature source Oral, resp. rate 18, height 5\' 2"  (1.575 m), weight 148 lb (67.132 kg), last menstrual period 07/28/2012, SpO2 99.00%. Physical Exam  Constitutional: She is oriented to person, place, and time. She appears well-developed and well-nourished.  Cardiovascular: Normal rate, regular rhythm and normal heart sounds.   No murmur heard. Respiratory: Effort normal and breath sounds normal. She has no wheezes.  GI: Soft. Bowel sounds are normal. There is no tenderness.  Musculoskeletal: Normal range of motion.  Neurological: She is alert and oriented to person,  place, and time.  Skin: Skin is warm and dry.  Psychiatric: She has a normal mood and affect. Her behavior is normal. Judgment and thought content normal.     Assessment/Plan PBC since 2007 New elevation of LFTs Scheduled now for liver core biopsy Pt aware of procedure benefits and risks and agreeable to proceed Consent signed and in chart  Christine Butler A 08/19/2012, 1:24 PM

## 2012-08-19 NOTE — Procedures (Signed)
Procedure:  Ultrasound guided core biopsy of liver Findings:  17 G needle advanced into right lobe of liver.  18 G core biopsy x 2 performed.

## 2012-12-30 ENCOUNTER — Other Ambulatory Visit: Payer: Self-pay

## 2013-06-15 ENCOUNTER — Other Ambulatory Visit (HOSPITAL_COMMUNITY): Payer: Self-pay | Admitting: Orthopedic Surgery

## 2013-06-15 DIAGNOSIS — M25561 Pain in right knee: Secondary | ICD-10-CM

## 2013-06-21 ENCOUNTER — Ambulatory Visit (HOSPITAL_COMMUNITY)
Admission: RE | Admit: 2013-06-21 | Discharge: 2013-06-21 | Disposition: A | Discharge status: Home / Self Care | Payer: 59 | Source: Ambulatory Visit | Attending: Orthopedic Surgery | Admitting: Orthopedic Surgery

## 2013-06-21 DIAGNOSIS — M25561 Pain in right knee: Secondary | ICD-10-CM

## 2013-06-21 DIAGNOSIS — R609 Edema, unspecified: Secondary | ICD-10-CM | POA: Insufficient documentation

## 2013-06-21 DIAGNOSIS — M25569 Pain in unspecified knee: Secondary | ICD-10-CM | POA: Insufficient documentation

## 2013-08-04 ENCOUNTER — Emergency Department
Admission: EM | Admit: 2013-08-04 | Discharge: 2013-08-04 | Disposition: A | Discharge status: Home / Self Care | Payer: 59 | Source: Home / Self Care | Attending: Emergency Medicine | Admitting: Emergency Medicine

## 2013-08-04 ENCOUNTER — Encounter: Payer: Self-pay | Admitting: Emergency Medicine

## 2013-08-04 DIAGNOSIS — N390 Urinary tract infection, site not specified: Secondary | ICD-10-CM

## 2013-08-04 HISTORY — DX: Urinary tract infection, site not specified: A49.9

## 2013-08-04 HISTORY — DX: Urinary tract infection, site not specified: N39.0

## 2013-08-04 LAB — POCT URINALYSIS DIP (MANUAL ENTRY)
Glucose, UA: 100
Ketones, POC UA: NEGATIVE
Nitrite, UA: POSITIVE
Protein Ur, POC: 30
Spec Grav, UA: 1.015 (ref 1.005–1.03)
Urobilinogen, UA: 1 (ref 0–1)
pH, UA: 5 (ref 5–8)

## 2013-08-04 MED ORDER — PHENAZOPYRIDINE-BUTABARB-HYOSC 150-15-0.3 MG PO TABS: 1.0000 | ORAL_TABLET | Freq: Three times a day (TID) | ORAL | Status: DC | PRN

## 2013-08-04 MED ORDER — SULFAMETHOXAZOLE-TRIMETHOPRIM 800-160 MG PO TABS: 1.0000 | ORAL_TABLET | Freq: Two times a day (BID) | ORAL | Status: DC

## 2013-08-04 NOTE — ED Notes (Signed)
Patient gives 5 day intermittent history of dysuria, frequency and lower back ache: started taking OTCs then began left over Cipro 500mg  po bid. Symptoms not resolving.

## 2013-08-04 NOTE — ED Provider Notes (Signed)
CSN: [id number]     Arrival date & time 08/04/13  9833 History   First MD Initiated Contact with Patient 08/04/13 [phone]     Chief Complaint  Patient presents with  . Dysuria  . Urinary Frequency    HPI This is a 43 y.o. female who presents today with UTI symptoms for 5 days.  + dysuria + frequency + urgency + Cloudy urine No hematuria No vaginal discharge Low-grade fever/chills No lower abdominal pain, except for mild suprapubic pain. No nausea No vomiting No definite flank or back pain, although occasionally has mild low back discomfort. + fatigue She denies chance of pregnancy.  LMP 07/31/13.  She had some leftover Cipro, and has been taking Cipro 500 twice a day the past 3 days without improvement. Also tried Pyridium which helps minimally, and she requests a stronger pain reliever for urinary pain.  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   History of frequent UTIs in the past, with negative urology workup.  Past Medical History  Diagnosis Date  . IBS (irritable bowel syndrome)   . Primary biliary cirrhosis   . Urinary tract bacterial infections    Past Surgical History  Procedure Laterality Date  . Incontinence surgery     History reviewed. No pertinent family history. History  Substance Use Topics  . Smoking status: Never Smoker   . Smokeless tobacco: Not on file  . Alcohol Use: 0.6 oz/week    1 Glasses of wine per week     Comment: for dinner   OB History   Grav Para Term Preterm Abortions TAB SAB Ect Mult Living                 Review of Systems  All other systems reviewed and are negative.   Allergies  Codeine and Erythromycin  Home Medications   Prior to Admission medications   Medication Sig Start Date End Date Taking? Authorizing Provider  Calcium Carbonate-Vitamin D (CALCIUM + D PO) Take 1 tablet by mouth daily.    Historical Provider,  MD  cholecalciferol (VITAMIN D) 1000 UNITS tablet Take 1,000 Units by mouth daily.    Historical Provider, MD  MELATONIN PO Take 1 tablet by mouth at bedtime.    Historical Provider, MD  Multiple Vitamin (MULTIVITAMIN WITH MINERALS) TABS Take 1 tablet by mouth daily.    Historical Provider, MD  omeprazole (PRILOSEC) 20 MG capsule Take 20 mg by mouth daily as needed (acid reflux).    Historical Provider, MD  phenazopyridine-butabarbital-hyoscyamine 150-15-0.3 MG tablet Take 1 tablet by mouth 3 (three) times daily as needed (For severe urinary pain). 08/04/13   Jacqulyn Cane, MD  sulfamethoxazole-trimethoprim (SEPTRA DS) 800-160 MG per tablet Take 1 tablet by mouth 2 (two) times daily. X 10 days 08/04/13   Jacqulyn Cane, MD  ursodiol (ACTIGALL) 300 MG capsule Take 600-900 mg by mouth 2 (two) times daily. 900mg  in am, 600mg  in pm    Historical Provider, MD  vitamin B-12 (CYANOCOBALAMIN) 1000 MCG tablet Take 1,000 mcg by mouth daily.    Historical Provider, MD   BP 118/78  Pulse 74  Temp(Src) 98.6 F (37 C)  Resp 16  Ht 5\' 2"  (1.575 m)  Wt 145 lb (65.772 kg)  BMI 26.51 kg/m2  SpO2 99%  LMP 07/25/2013 Physical Exam  Nursing note and vitals reviewed. Constitutional: She is oriented to person, place, and time.  She appears well-developed and well-nourished. No distress.  HENT:  Mouth/Throat: Oropharynx is clear and moist.  Eyes: No scleral icterus.  Neck: Neck supple.  Cardiovascular: Normal rate, regular rhythm and normal heart sounds.   Pulmonary/Chest: Breath sounds normal.  Abdominal: Soft. She exhibits no mass. There is no hepatosplenomegaly. There is tenderness in the suprapubic area. There is no rebound, no guarding and no CVA tenderness.  Lymphadenopathy:    She has no cervical adenopathy.  Neurological: She is alert and oriented to person, place, and time.  Skin: Skin is warm and dry.    ED Course  Procedures (including critical care time) Labs Review Labs Reviewed  URINE CULTURE   POCT URINALYSIS DIP (MANUAL ENTRY)   Results for orders placed during the hospital encounter of 08/04/13  POCT URINALYSIS DIP (MANUAL ENTRY)      Result Value Ref Range   Color, UA orange     Clarity, UA cloudy     Glucose, UA =100     Bilirubin, UA small     Bilirubin, UA negative     Spec Grav, UA 1.015  1.005 - 1.03   Blood, UA small     pH, UA 5.0  5 - 8   Protein Ur, POC =30     Urobilinogen, UA 1.0  0 - 1   Nitrite, UA Positive     Leukocytes, UA small (1+)      Imaging Review No results found.   MDM   1. UTI (urinary tract infection)    urinalysis positive for blood, nitrates, leukocytes.  Despite being on Cipro for past 3 days, she has significant UTI, acute cystitis without evidence of pyelonephritis. I researched on up-to-date software for possible use of Septra in patient with history of mild chronic elevated LFTs. No contraindication for using Septra in this situation.  Treatment options discussed, as well as risks, benefits, alternatives. Patient voiced understanding and agreement with the following plans: Send off urine culture Treat with Septra DS twice a day x10 days She requested something stronger than regular Pyridium for symptomatic relief, and I prescribed Pyridium Plus prn severe urinary pain. Push fluids, other symptomatic care discussed.  Followup with PCP or urologist in 4-5 days if not improving, sooner if worse or new symptoms. Precautions discussed. Red flags discussed. Questions invited and answered. Patient voiced understanding and agreement.    Jacqulyn Cane, MD 08/04/13 [phone]

## 2013-08-05 LAB — URINE CULTURE
Colony Count: NO GROWTH
Organism ID, Bacteria: NO GROWTH

## 2013-08-06 ENCOUNTER — Telehealth: Payer: Self-pay | Admitting: *Deleted

## 2013-08-09 ENCOUNTER — Telehealth: Payer: Self-pay | Admitting: *Deleted

## 2013-10-03 ENCOUNTER — Other Ambulatory Visit: Payer: Self-pay | Admitting: Gastroenterology

## 2013-10-03 DIAGNOSIS — K745 Biliary cirrhosis, unspecified: Secondary | ICD-10-CM

## 2013-10-03 DIAGNOSIS — N951 Menopausal and female climacteric states: Secondary | ICD-10-CM

## 2013-10-06 ENCOUNTER — Ambulatory Visit
Admission: RE | Admit: 2013-10-06 | Discharge: 2013-10-06 | Disposition: A | Discharge status: Home / Self Care | Payer: 59 | Source: Ambulatory Visit | Attending: Gastroenterology | Admitting: Gastroenterology

## 2013-10-06 DIAGNOSIS — K745 Biliary cirrhosis, unspecified: Secondary | ICD-10-CM

## 2013-10-25 ENCOUNTER — Other Ambulatory Visit: Payer: 59

## 2014-03-09 ENCOUNTER — Encounter (HOSPITAL_COMMUNITY): Payer: Self-pay | Admitting: *Deleted

## 2014-03-28 ENCOUNTER — Ambulatory Visit (HOSPITAL_COMMUNITY)
Admission: RE | Admit: 2014-03-28 | Discharge: 2014-03-28 | Disposition: A | Discharge status: Home / Self Care | Payer: 59 | Source: Ambulatory Visit | Attending: Obstetrics and Gynecology | Admitting: Obstetrics and Gynecology

## 2014-03-28 ENCOUNTER — Ambulatory Visit (HOSPITAL_COMMUNITY): Payer: 59 | Admitting: Anesthesiology

## 2014-03-28 ENCOUNTER — Encounter (HOSPITAL_COMMUNITY)
Admission: RE | Disposition: A | Discharge status: Home / Self Care | Payer: Self-pay | Source: Ambulatory Visit | Attending: Obstetrics and Gynecology

## 2014-03-28 ENCOUNTER — Encounter (HOSPITAL_COMMUNITY): Payer: Self-pay | Admitting: Anesthesiology

## 2014-03-28 DIAGNOSIS — N879 Dysplasia of cervix uteri, unspecified: Secondary | ICD-10-CM | POA: Insufficient documentation

## 2014-03-28 DIAGNOSIS — K219 Gastro-esophageal reflux disease without esophagitis: Secondary | ICD-10-CM | POA: Insufficient documentation

## 2014-03-28 HISTORY — DX: Unspecified osteoarthritis, unspecified site: M19.90

## 2014-03-28 HISTORY — DX: Gastro-esophageal reflux disease without esophagitis: K21.9

## 2014-03-28 HISTORY — DX: Other specified postprocedural states: Z98.890

## 2014-03-28 HISTORY — PX: LEEP: SHX91

## 2014-03-28 HISTORY — DX: Other specified postprocedural states: R11.2

## 2014-03-28 LAB — CBC
HCT: 39.6 % (ref 36.0–46.0)
Hemoglobin: 13.2 g/dL (ref 12.0–15.0)
MCH: 32.4 pg (ref 26.0–34.0)
MCHC: 33.3 g/dL (ref 30.0–36.0)
MCV: 97.1 fL (ref 78.0–100.0)
PLATELETS: 281 10*3/uL (ref 150–400)
RBC: 4.08 MIL/uL (ref 3.87–5.11)
RDW: 14.1 % (ref 11.5–15.5)
WBC: 5.4 10*3/uL (ref 4.0–10.5)

## 2014-03-28 LAB — PREGNANCY, URINE: Preg Test, Ur: NEGATIVE

## 2014-03-28 SURGERY — LEEP (LOOP ELECTROSURGICAL EXCISION PROCEDURE)
Anesthesia: Monitor Anesthesia Care | Site: Vagina

## 2014-03-28 MED ORDER — DEXAMETHASONE SODIUM PHOSPHATE 10 MG/ML IJ SOLN
INTRAMUSCULAR | Status: AC
  Filled 2014-03-28: qty 1

## 2014-03-28 MED ORDER — ONDANSETRON HCL 4 MG/2ML IJ SOLN
INTRAMUSCULAR | Status: DC | PRN
  Administered 2014-03-28: 4 mg via INTRAVENOUS

## 2014-03-28 MED ORDER — ACETIC ACID 5 % SOLN
Status: AC
  Filled 2014-03-28: qty 500

## 2014-03-28 MED ORDER — PROPOFOL INFUSION 10 MG/ML OPTIME
INTRAVENOUS | Status: DC | PRN
  Administered 2014-03-28: 120 ug/kg/min via INTRAVENOUS

## 2014-03-28 MED ORDER — KETOROLAC TROMETHAMINE 30 MG/ML IJ SOLN
INTRAMUSCULAR | Status: AC
  Filled 2014-03-28: qty 1

## 2014-03-28 MED ORDER — PROPOFOL 10 MG/ML IV BOLUS
INTRAVENOUS | Status: AC
  Filled 2014-03-28: qty 20

## 2014-03-28 MED ORDER — ACETAMINOPHEN 160 MG/5ML PO SOLN
960.0000 mg | Freq: Four times a day (QID) | ORAL | Status: DC | PRN
  Administered 2014-03-28: 650 mg via ORAL

## 2014-03-28 MED ORDER — IODINE STRONG (LUGOLS) 5 % PO SOLN
ORAL | Status: AC
  Filled 2014-03-28: qty 1

## 2014-03-28 MED ORDER — ACETAMINOPHEN 160 MG/5ML PO SOLN
ORAL | Status: AC
  Administered 2014-03-28: 650 mg via ORAL
  Filled 2014-03-28: qty 40.6

## 2014-03-28 MED ORDER — FENTANYL CITRATE 0.05 MG/ML IJ SOLN
INTRAMUSCULAR | Status: AC
  Filled 2014-03-28: qty 5

## 2014-03-28 MED ORDER — FENTANYL CITRATE 0.05 MG/ML IJ SOLN: 25.0000 ug | INTRAMUSCULAR | Status: DC | PRN

## 2014-03-28 MED ORDER — KETOROLAC TROMETHAMINE 30 MG/ML IJ SOLN
INTRAMUSCULAR | Status: DC | PRN
  Administered 2014-03-28: 30 mg via INTRAVENOUS

## 2014-03-28 MED ORDER — LIDOCAINE HCL (CARDIAC) 20 MG/ML IV SOLN
INTRAVENOUS | Status: DC | PRN
  Administered 2014-03-28: 80 mg via INTRAVENOUS

## 2014-03-28 MED ORDER — BUPIVACAINE HCL (PF) 0.25 % IJ SOLN
INTRAMUSCULAR | Status: DC | PRN
Start: 2014-03-28 — End: 2014-03-28
  Administered 2014-03-28: 10 mL

## 2014-03-28 MED ORDER — LIDOCAINE HCL (CARDIAC) 20 MG/ML IV SOLN
INTRAVENOUS | Status: AC
  Filled 2014-03-28: qty 5

## 2014-03-28 MED ORDER — FERRIC SUBSULFATE 259 MG/GM EX SOLN
CUTANEOUS | Status: AC
  Filled 2014-03-28: qty 8

## 2014-03-28 MED ORDER — MIDAZOLAM HCL 2 MG/2ML IJ SOLN
INTRAMUSCULAR | Status: AC
  Filled 2014-03-28: qty 2

## 2014-03-28 MED ORDER — CEFAZOLIN SODIUM-DEXTROSE 2-3 GM-% IV SOLR
2.0000 g | INTRAVENOUS | Status: AC
  Administered 2014-03-28: 2 g via INTRAVENOUS

## 2014-03-28 MED ORDER — PROPOFOL 10 MG/ML IV EMUL
INTRAVENOUS | Status: DC | PRN
  Administered 2014-03-28 (×5): 40 mg via INTRAVENOUS

## 2014-03-28 MED ORDER — ONDANSETRON HCL 4 MG/2ML IJ SOLN
INTRAMUSCULAR | Status: AC
  Filled 2014-03-28: qty 2

## 2014-03-28 MED ORDER — CEFAZOLIN SODIUM-DEXTROSE 2-3 GM-% IV SOLR
INTRAVENOUS | Status: AC
  Filled 2014-03-28: qty 50

## 2014-03-28 MED ORDER — IODINE STRONG (LUGOLS) 5 % PO SOLN
ORAL | Status: DC | PRN
  Administered 2014-03-28: 0.1 mL

## 2014-03-28 MED ORDER — LIDOCAINE HCL 1 % IJ SOLN
INTRAMUSCULAR | Status: AC
  Filled 2014-03-28: qty 20

## 2014-03-28 MED ORDER — LIDOCAINE HCL 1 % IJ SOLN
INTRAMUSCULAR | Status: DC | PRN
  Administered 2014-03-28: 10 mL

## 2014-03-28 MED ORDER — SCOPOLAMINE 1 MG/3DAYS TD PT72
1.0000 | MEDICATED_PATCH | Freq: Once | TRANSDERMAL | Status: DC
  Administered 2014-03-28: 1.5 mg via TRANSDERMAL

## 2014-03-28 MED ORDER — DEXAMETHASONE SODIUM PHOSPHATE 10 MG/ML IJ SOLN
INTRAMUSCULAR | Status: DC | PRN
  Administered 2014-03-28: 10 mg via INTRAVENOUS

## 2014-03-28 MED ORDER — MIDAZOLAM HCL 2 MG/2ML IJ SOLN
INTRAMUSCULAR | Status: DC | PRN
  Administered 2014-03-28: 2 mg via INTRAVENOUS
  Administered 2014-03-28: 1 mg via INTRAVENOUS

## 2014-03-28 MED ORDER — BUPIVACAINE HCL (PF) 0.25 % IJ SOLN
INTRAMUSCULAR | Status: AC
  Filled 2014-03-28: qty 10

## 2014-03-28 MED ORDER — FERRIC SUBSULFATE SOLN
Status: DC | PRN
  Administered 2014-03-28: 1

## 2014-03-28 MED ORDER — SCOPOLAMINE 1 MG/3DAYS TD PT72
MEDICATED_PATCH | TRANSDERMAL | Status: AC
  Administered 2014-03-28: 1.5 mg via TRANSDERMAL
  Filled 2014-03-28: qty 1

## 2014-03-28 MED ORDER — PROPOFOL 10 MG/ML IV BOLUS
INTRAVENOUS | Status: AC
  Filled 2014-03-28: qty 40

## 2014-03-28 MED ORDER — LACTATED RINGERS IV SOLN
INTRAVENOUS | Status: DC
  Administered 2014-03-28 (×2): via INTRAVENOUS

## 2014-03-28 SURGICAL SUPPLY — 26 items
APPLICATOR COTTON TIP 6IN STRL (MISCELLANEOUS) ×2 IMPLANT
CATH ROBINSON RED A/P 16FR (CATHETERS) ×2 IMPLANT
CLOTH BEACON ORANGE TIMEOUT ST (SAFETY) ×2 IMPLANT
CONTAINER PREFILL 10% NBF 60ML (FORM) ×4 IMPLANT
COUNTER NEEDLE 1200 MAGNETIC (NEEDLE) ×2 IMPLANT
ELECT BALL LEEP 5MM RED (ELECTRODE) ×2 IMPLANT
ELECT LOOP LEEP RND 15X12 GRN (CUTTING LOOP) ×2
ELECT REM PT RETURN 9FT ADLT (ELECTROSURGICAL) ×2
ELECTRODE LOOP LP RND 15X12GRN (CUTTING LOOP) ×1 IMPLANT
ELECTRODE REM PT RTRN 9FT ADLT (ELECTROSURGICAL) ×1 IMPLANT
EVACUATOR PREFILTER SMOKE (MISCELLANEOUS) ×2 IMPLANT
GLOVE BIO SURGEON STRL SZ 6.5 (GLOVE) ×2 IMPLANT
GOWN STRL REUS W/TWL LRG LVL3 (GOWN DISPOSABLE) ×4 IMPLANT
HOSE NS SMOKE EVAC 7/8 X6 (MISCELLANEOUS) ×2 IMPLANT
NEEDLE SPNL 22GX3.5 QUINCKE BK (NEEDLE) ×2 IMPLANT
NS IRRIG 1000ML POUR BTL (IV SOLUTION) ×2 IMPLANT
PACK VAGINAL MINOR WOMEN LF (CUSTOM PROCEDURE TRAY) ×2 IMPLANT
PAD OB MATERNITY 4.3X12.25 (PERSONAL CARE ITEMS) ×2 IMPLANT
PENCIL BUTTON HOLSTER BLD 10FT (ELECTRODE) ×2 IMPLANT
REDUCER FITTING SMOKE EVAC (MISCELLANEOUS) ×2 IMPLANT
SCOPETTES 8  STERILE (MISCELLANEOUS) ×2
SCOPETTES 8 STERILE (MISCELLANEOUS) ×2 IMPLANT
SYR CONTROL 10ML LL (SYRINGE) ×2 IMPLANT
TOWEL OR 17X24 6PK STRL BLUE (TOWEL DISPOSABLE) ×4 IMPLANT
TUBING SMOKE EVAC HOSE ADAPTER (MISCELLANEOUS) ×2 IMPLANT
WATER STERILE IRR 1000ML POUR (IV SOLUTION) ×2 IMPLANT

## 2014-03-28 NOTE — Anesthesia Postprocedure Evaluation (Signed)
  Anesthesia Post-op Note  Patient: Christine Butler  Procedure(s) Performed: Procedure(s): LOOP ELECTROSURGICAL EXCISION PROCEDURE (LEEP) cone biopsy (N/A) Patient is awake and responsive. Pain and nausea are reasonably well controlled. Vital signs are stable and clinically acceptable. Oxygen saturation is clinically acceptable. There are no apparent anesthetic complications at this time. Patient is ready for discharge.

## 2014-03-28 NOTE — Transfer of Care (Signed)
Immediate Anesthesia Transfer of Care Note  Patient: Christine Butler  Procedure(s) Performed: Procedure(s): LOOP ELECTROSURGICAL EXCISION PROCEDURE (LEEP) cone biopsy (N/A)  Patient Location: PACU  Anesthesia Type:MAC  Level of Consciousness: awake, alert , oriented and patient cooperative  Airway & Oxygen Therapy: Patient Spontanous Breathing  Post-op Assessment: Report given to RN and Post -op Vital signs reviewed and stable  Post vital signs: Reviewed and stable  Last Vitals:  Filed Vitals:   03/28/14 1219  BP: 144/82  Temp: 36.8 C  Resp: 20    Complications: No apparent anesthesia complications

## 2014-03-28 NOTE — Anesthesia Preprocedure Evaluation (Addendum)
Anesthesia Evaluation  Patient identified by MRN, date of birth, ID band Patient awake    Reviewed: Allergy & Precautions, H&P , Patient's Chart, lab work & pertinent test results, reviewed documented beta blocker date and time   Airway Mallampati: II  TM Distance: >3 FB Neck ROM: full    Dental no notable dental hx.    Pulmonary  breath sounds clear to auscultation  Pulmonary exam normal       Cardiovascular Rhythm:regular Rate:Normal     Neuro/Psych    GI/Hepatic GERD-  Medicated,  Endo/Other    Renal/GU      Musculoskeletal   Abdominal   Peds  Hematology   Anesthesia Other Findings   Reproductive/Obstetrics                             Anesthesia Physical Anesthesia Plan  ASA: II  Anesthesia Plan: MAC   Post-op Pain Management:    Induction: Intravenous  Airway Management Planned: Mask, Natural Airway and LMA  Additional Equipment:   Intra-op Plan:   Post-operative Plan:   Informed Consent: I have reviewed the patients History and Physical, chart, labs and discussed the procedure including the risks, benefits and alternatives for the proposed anesthesia with the patient or authorized representative who has indicated his/her understanding and acceptance.   Dental Advisory Given  Plan Discussed with: CRNA and Surgeon  Anesthesia Plan Comments: (Discussed sedation and potential to need to place airway or ETT if warranted by clinical changes intra-operatively. We will start procedure as MAC.)        Anesthesia Quick Evaluation

## 2014-03-28 NOTE — Discharge Instructions (Signed)
NO IBUPROFEN PRODUCTS (MOTRIN, ADVIL, ALEVE) BEFORE 8 PM   Loop Electrosurgical Excision Procedure  Care After  Refer to this sheet in the next few weeks. These instructions provide you with information on caring for yourself after your procedure. Your caregiver may also give you more specific instructions. Your treatment has been planned according to current medical practices, but problems sometimes occur. Call your caregiver if you have any problems or questions after your procedure.  HOME CARE INSTRUCTIONS  Do not use tampons, douche, or have sexual intercourse for 2 weeks or as directed by your caregiver.  Begin normal activities if you have no or minimal cramping or bleeding, unless directed otherwise by your caregiver.  Take your temperature if you feel sick. Write down your temperature on paper, and tell your caregiver if you have a fever.  Take all medicines as directed by your caregiver.  Keep all your follow-up appointments and Pap tests as directed by your caregiver. SEEK IMMEDIATE MEDICAL CARE IF:  You have bleeding that is heavier or longer than a normal menstrual cycle.  You have bleeding that is bright red.  You have blood clots.  You have a fever.  You have increasing cramps or pain not relieved by medicine.  You develop abdominal pain that does not seem to be related to the same area of earlier cramping and pain.  You are lightheaded, unusually weak, or faint.  You develop painful or bloody urination.  You develop a bad smelling vaginal discharge. MAKE SURE YOU:  Understand these instructions.  Will watch your condition.  Will get help right away if you are not doing well or get worse. Document Released: 10/24/2010 Document Revised: 05/05/2011 Document Reviewed: 10/24/2010  [facility] Patient Information 2015 [place], [place]. This information is not intended to replace advice given to you by your health care provider. Make sure you discuss any questions you have with your  health care provider.

## 2014-03-28 NOTE — Progress Notes (Signed)
H and p on the chart No significant changes Will proceed with LEEP Consent signed

## 2014-03-28 NOTE — H&P (Signed)
  44 year old G 3 P 3 with recent PAP showing HGSIL. She has a history of cervical dysplasia and had been lost to follow up.  Past Medical History  Diagnosis Date  . IBS (irritable bowel syndrome)   . Primary biliary cirrhosis   . Urinary tract bacterial infections   . PONV (postoperative nausea and vomiting)   . SVD (spontaneous vaginal delivery)     x 3  . GERD (gastroesophageal reflux disease)   . Arthritis     right knee   Past Surgical History  Procedure Laterality Date  . Incontinence surgery    . Liver biopsy      x 2  . Wisdom tooth extraction     Codeine and Erythromycin No results found for this or any previous visit (from the past 24 hour(s)).  Ht 5\' 2"  (1.575 m)  Wt 154 lb (69.854 kg)  BMI 28.16 kg/m2  LMP 02/15/2014 (Approximate) General alert and oriented Lung CTAB Car RRR Abdomen is soft and non tender History reviewed. No pertinent family history. History   Social History  . Marital Status: Divorced    Spouse Name: N/A    Number of Children: N/A  . Years of Education: N/A   Occupational History  . Not on file.   Social History Main Topics  . Smoking status: Never Smoker   . Smokeless tobacco: Not on file  . Alcohol Use: 0.6 oz/week    1 Glasses of wine per week     Comment: for dinner  . Drug Use: No  . Sexual Activity: Not on file   Other Topics Concern  . Not on file   Social History Narrative   IMPRESSION: Cervical Dysplasia  PLAN: LEEP Cone Biopsy of cervix Risks reviewed Consent signed

## 2014-03-28 NOTE — Brief Op Note (Signed)
03/28/2014  2:03 PM  PATIENT:  Christine Butler  44 y.o. female  PRE-OPERATIVE DIAGNOSIS:  High grade cervical dysplasia   POST-OPERATIVE DIAGNOSIS:  Same  PROCEDURE:  Procedure(s): LOOP ELECTROSURGICAL EXCISION PROCEDURE (LEEP) cone biopsy (N/A)  SURGEON:  Surgeon(s) and Role:    * Cyril Mourning, MD - Primary  PHYSICIAN ASSISTANT:   ASSISTANTS: none   ANESTHESIA:   paracervical block and MAC  EBL:     BLOOD ADMINISTERED:none  DRAINS: none   LOCAL MEDICATIONS USED:  MARCAINE    and LIDOCAINE   SPECIMEN:  Source of Specimen:  ectocervix and endocervix  DISPOSITION OF SPECIMEN:  PATHOLOGY  COUNTS:  YES  TOURNIQUET:  * No tourniquets in log *  DICTATION: .Other Dictation: Dictation Number [id number]  PLAN OF CARE: Discharge to home after PACU  PATIENT DISPOSITION:  PACU - hemodynamically stable.   Delay start of Pharmacological VTE agent (>24hrs) due to surgical blood loss or risk of bleeding: not applicable

## 2014-03-29 ENCOUNTER — Encounter (HOSPITAL_COMMUNITY): Payer: Self-pay | Admitting: Obstetrics and Gynecology

## 2014-03-29 NOTE — Op Note (Signed)
Christine Butler, Christine Butler                ACCOUNT NO.:  [id number]  MEDICAL RECORD NO.:  [id number]  LOCATION:  [facility]                          FACILITY:  [facility]  PHYSICIAN:  Aundrea Higginbotham L. Demir Titsworth, M.D.DATE OF BIRTH:  [birth year]  DATE OF PROCEDURE:  03/28/2014 DATE OF DISCHARGE:  03/28/2014                              OPERATIVE REPORT   PREOPERATIVE DIAGNOSIS:  High-grade cervical dysplasia.  POSTOPERATIVE DIAGNOSIS:  High-grade cervical dysplasia.  PROCEDURE:  LEEP cone biopsy of the cervix.  SURGEON:  Dr. Helane Rima.  ANESTHESIA:  MAC with paracervical.  EBL:  Minimal.  COMPLICATIONS:  None.  PATHOLOGY:  Ectocervix and endocervix.  PROCEDURE:  The patient was taken to the operating room, where anesthesia was administered.  She was then prepped and draped in usual sterile fashion.  An insulated speculum was inserted and the cervix was grasped with a tenaculum.  A paracervical block was performed initially with lidocaine and then we also gave an additional 10 mL of Marcaine to hopefully help with her postop pain.  Using a loop, a LEEP cone biopsy was performed after Lugol solution was applied to the cervix with 1 specimen ectocervix from the __________ posterior to anterior ectocervix and a second endocervical hat.  Rollerball was then applied for hemostasis.  There was no bleeding whatsoever.  Monsel was applied.  All instruments were removed from the vagina.  All sponge, lap, and instrument counts were correct x2.  The patient went to the recovery room in stable condition.     Tonilynn Bieker L. Helane Rima, M.D.     Nevin Bloodgood  D:  03/28/2014  T:  03/29/2014  Job:  [id number]

## 2014-05-27 ENCOUNTER — Telehealth: Payer: Self-pay | Admitting: Nurse Practitioner

## 2014-05-27 DIAGNOSIS — H01139 Eczematous dermatitis of unspecified eye, unspecified eyelid: Secondary | ICD-10-CM

## 2014-05-27 DIAGNOSIS — H1013 Acute atopic conjunctivitis, bilateral: Secondary | ICD-10-CM

## 2014-05-27 MED ORDER — FLUTICASONE PROPIONATE 50 MCG/ACT NA SUSP: 2.0000 | Freq: Every day | NASAL | Status: DC

## 2014-05-27 NOTE — Progress Notes (Signed)
We are sorry that you are not feeling well.  Here is how we plan to help!  Based on what you have shared with me it looks like you have conjunctivitis due to allergies.as well as lid irritetion  Conjunctivitis is a common inflammatory or infectious condition of the eye that is often referred to as "pink eye".  In most cases it is contagious (viral or bacterial). However, not all conjunctivitis requires antibiotics (ex. Allergic).  We have made appropriate suggestions for you based upon your presentation.  I recommend that you use OpconA, 1-2 drops every 4-6 hours (an over the counter allergy drop available at your local pharmacy). I have also rx flonase nasal spray- 2 sprays in each nostril daily. For your eye lids you will want to use an OTC hydrcortisone cream BID any until resolves. Avoid using soap on face and try not to scratch or rub eyes if posible.  Your pharmacist may have an alternative suggestion. If does not improve let me know.  Pink eye can be highly contagious.  It is typically spread through direct contact with secretions, or contaminated objects or surfaces that one may have touched.  Strict handwashing is suggested with soap and water is urged.  If not available, use alcohol based had sanitizer.  Avoid unnecessary touching of the eye.  If you wear contact lenses, you will need to refrain from wearing them until you see no white discharge from the eye for at least 24 hours after being on medication.  You should see symptom improvement in 1-2 days after starting the medication regimen.  Call us if symptoms are not improved in 1-2 days.  Home Care:  Wash your hands often!  Do not wear your contacts until you complete your treatment plan.  Avoid sharing towels, bed linen, personal items with a person who has pink eye.  See attention for anyone in your home with similar symptoms.  Get Help Right Away If:  Your symptoms do not improve.  You develop blurred or loss of vision.  Your  symptoms worsen (increased discharge, pain or redness)  Your e-visit answers were reviewed by a board certified advanced clinical practitioner to complete your personal care plan.  Depending on the condition, your plan could have included both over the counter or prescription medications.  If there is a problem please reply  once you have received a response from your provider.  Your safety is important to [place].  If you have drug allergies check your prescription carefully.    You can use MyChart to ask questions about today's visit, request a non-urgent call back, or ask for a work or school excuse.  You will get an e-mail in the next two days asking about your experience.  I hope that your e-visit has been valuable and will speed your recovery. Thank you for using e-visits.

## 2014-06-28 ENCOUNTER — Other Ambulatory Visit: Payer: Self-pay

## 2014-06-29 LAB — CYTOLOGY - PAP

## 2014-09-27 ENCOUNTER — Other Ambulatory Visit: Payer: Self-pay | Admitting: Obstetrics and Gynecology

## 2014-09-28 LAB — CYTOLOGY - PAP

## 2015-03-20 DIAGNOSIS — Z6828 Body mass index (BMI) 28.0-28.9, adult: Secondary | ICD-10-CM | POA: Diagnosis not present

## 2015-03-20 DIAGNOSIS — R87610 Atypical squamous cells of undetermined significance on cytologic smear of cervix (ASC-US): Secondary | ICD-10-CM | POA: Diagnosis not present

## 2015-03-20 DIAGNOSIS — Z01419 Encounter for gynecological examination (general) (routine) without abnormal findings: Secondary | ICD-10-CM | POA: Diagnosis not present

## 2015-03-20 DIAGNOSIS — Z1212 Encounter for screening for malignant neoplasm of rectum: Secondary | ICD-10-CM | POA: Diagnosis not present

## 2015-04-03 MED FILL — URSODIOL 300 MG CAPSULE: 300 | 90 days supply | Qty: 450 | Fill #1

## 2015-04-05 MED FILL — ZOLPIDEM TARTRATE 10 MG TAB: 10 | 30 days supply | Qty: 30 | Fill #0

## 2015-05-01 DIAGNOSIS — R1013 Epigastric pain: Secondary | ICD-10-CM | POA: Diagnosis not present

## 2015-05-01 DIAGNOSIS — K743 Primary biliary cirrhosis: Secondary | ICD-10-CM | POA: Diagnosis not present

## 2015-05-17 MED FILL — ZOLPIDEM TARTRATE 10 MG TAB: 10 | 30 days supply | Qty: 30 | Fill #1

## 2015-07-09 MED FILL — ZOLPIDEM TARTRATE 10 MG TAB: 10 | 30 days supply | Qty: 30 | Fill #2

## 2015-07-26 MED FILL — URSODIOL 300 MG CAPSULE: 300 | 90 days supply | Qty: 450 | Fill #2

## 2015-08-08 DIAGNOSIS — L239 Allergic contact dermatitis, unspecified cause: Secondary | ICD-10-CM | POA: Diagnosis not present

## 2015-09-07 ENCOUNTER — Ambulatory Visit (INDEPENDENT_AMBULATORY_CARE_PROVIDER_SITE_OTHER): Payer: 59 | Admitting: Physician Assistant

## 2015-09-07 ENCOUNTER — Ambulatory Visit (INDEPENDENT_AMBULATORY_CARE_PROVIDER_SITE_OTHER): Payer: 59

## 2015-09-07 VITALS — BP 122/80 | HR 97 | Temp 97.9°F | Resp 18 | Ht 61.75 in | Wt 161.4 lb

## 2015-09-07 DIAGNOSIS — M940 Chondrocostal junction syndrome [Tietze]: Secondary | ICD-10-CM | POA: Diagnosis not present

## 2015-09-07 DIAGNOSIS — M25511 Pain in right shoulder: Secondary | ICD-10-CM

## 2015-09-07 DIAGNOSIS — R079 Chest pain, unspecified: Secondary | ICD-10-CM | POA: Diagnosis not present

## 2015-09-07 DIAGNOSIS — R072 Precordial pain: Secondary | ICD-10-CM

## 2015-09-07 MED ORDER — LIDOCAINE 5 % EX PTCH
1.0000 | MEDICATED_PATCH | CUTANEOUS | Status: DC
Start: 2015-09-07 — End: 2017-04-03

## 2015-09-07 MED ORDER — CYCLOBENZAPRINE HCL 5 MG PO TABS: 5.0000 mg | ORAL_TABLET | Freq: Three times a day (TID) | ORAL | Status: DC | PRN

## 2015-09-07 MED ORDER — TRAMADOL HCL 50 MG PO TABS: 50.0000 mg | ORAL_TABLET | Freq: Three times a day (TID) | ORAL | Status: DC | PRN

## 2015-09-07 MED FILL — traMADol HCL 50 MG TABS: 50 | 10 days supply | Qty: 30 | Fill #0

## 2015-09-07 MED FILL — LIDOCAINE 5% PATCH: 5 | 30 days supply | Qty: 30 | Fill #0

## 2015-09-07 MED FILL — CYCLOBENZAPRINE 5 MG TABLET: 5 | 10 days supply | Qty: 30 | Fill #0

## 2015-09-07 NOTE — Patient Instructions (Addendum)
Your xray was normal today. Please continue to schedule the NSAID as you have been doing.  Please take your prilosec daily while you're taking more NSAIDs.  You can take the flexeril and the tramadol as needed for pain and to help relax the musculature.  If you're not improved in 2 weeks please return for further work up.   Costochondritis Costochondritis, sometimes called Tietze syndrome, is a swelling and irritation (inflammation) of the tissue (cartilage) that connects your ribs with your breastbone (sternum). It causes pain in the chest and rib area. Costochondritis usually goes away on its own over time. It can take up to 6 weeks or longer to get better, especially if you are unable to limit your activities. CAUSES  Some cases of costochondritis have no known cause. Possible causes include:  Injury (trauma).  Exercise or activity such as lifting.  Severe coughing. SIGNS AND SYMPTOMS  Pain and tenderness in the chest and rib area.  Pain that gets worse when coughing or taking deep breaths.  Pain that gets worse with specific movements. DIAGNOSIS  Your health care provider will do a physical exam and ask about your symptoms. Chest X-rays or other tests may be done to rule out other problems. TREATMENT  Costochondritis usually goes away on its own over time. Your health care provider may prescribe medicine to help relieve pain. HOME CARE INSTRUCTIONS   Avoid exhausting physical activity. Try not to strain your ribs during normal activity. This would include any activities using chest, abdominal, and side muscles, especially if heavy weights are used.  Apply ice to the affected area for the first 2 days after the pain begins.  Put ice in a plastic bag.  Place a towel between your skin and the bag.  Leave the ice on for 20 minutes, 2-3 times a day.  Only take over-the-counter or prescription medicines as directed by your health care provider. SEEK MEDICAL CARE IF:  You have  redness or swelling at the rib joints. These are signs of infection.  Your pain does not go away despite rest or medicine. SEEK IMMEDIATE MEDICAL CARE IF:   Your pain increases or you are very uncomfortable.  You have shortness of breath or difficulty breathing.  You cough up blood.  You have worse chest pains, sweating, or vomiting.  You have a fever or persistent symptoms for more than 2-3 days.  You have a fever and your symptoms suddenly get worse. MAKE SURE YOU:   Understand these instructions.  Will watch your condition.  Will get help right away if you are not doing well or get worse.   This information is not intended to replace advice given to you by your health care provider. Make sure you discuss any questions you have with your health care provider.   Document Released: 11/20/2004 Document Revised: 12/01/2012 Document Reviewed: 09/14/2012 Elsevier Interactive Patient Education [employer].

## 2015-09-07 NOTE — Progress Notes (Signed)
Subjective:    Patient ID: Christine Butler, female    DOB: 05-27-1970, 45 y.o.   MRN: [id number]  Chief Complaint  Patient presents with  . Shoulder Pain    right shoulder pain that is going into right of chest since Sunday, pain radiating to back, concern with about broken rib   . Arm Pain    pain radiating to right side of back and chest, difficulty using arm    Medications, allergies, past medical history, surgical history, family history, social history and problem list reviewed and updated.  HPI  45 yof presents with above complaints.   Symptoms started 5 days ago while seated at home in the evening. Had sudden onset sharp chest pain just right of sternum. Has been persistent for 5 days. Denies any preceding trauma or new activities. She did, however, do some yardwork 3 days prior to the onset of CP.   The pain worsens with deep breaths, coughing, or with certain movements of right shoulder. Certain positions at night worsen the pain. Denies any exertional cp, or assoc sob, palpitations, n/v, diaphoresis, presyncope, or syncope.   At times the pain will radiate through to the scapula. No specific time this occurs.   Pain improves with ibuprofen, decreases from 8/10 to 2/10 but does not completely resolve.   Denies recent trauma, immobility, unilateral leg pain or swelling, estrogen use, cancer, or prior clots. Denies hemoptysis. Has hx of biliary cirrhosis but has never had pain like this before assoc with her liver disease.   Denies hx HTN, elevated cholesterol, DM, OSA, or smoking. No heart disease in parents or siblings.   Review of Systems See HPI     Objective:   Physical Exam  Constitutional: She is oriented to person, place, and time. She appears well-developed and well-nourished.  Non-toxic appearance. She does not have a sickly appearance. She does not appear ill. No distress.  BP 122/80 mmHg  Pulse 97  Temp(Src) 97.9 F (36.6 C) (Oral)  Resp 18  Ht 5' 1.75"  (1.568 m)  Wt 161 lb 6.4 oz (73.211 kg)  BMI 29.78 kg/m2  SpO2 98%  LMP 08/22/2015   Cardiovascular: Normal rate, regular rhythm and normal heart sounds.  Exam reveals no gallop.   No murmur heard. No LE edema.   Pulmonary/Chest: Effort normal and breath sounds normal. No tachypnea. She exhibits tenderness.    TTP with reproducible pain over circled area. Pain also reproduces while lying down and sitting up.   Abdominal: There is no hepatosplenomegaly. There is no tenderness. There is no rigidity and no guarding.  Neurological: She is alert and oriented to person, place, and time.  Skin: Skin is warm and dry. No rash noted.  Psychiatric: She has a normal mood and affect. Her speech is normal and behavior is normal.   7/14 Rib XR FINDINGS: No fracture or other bone lesions are seen involving the ribs. There is no evidence of pneumothorax or pleural effusion. Both lungs are clear. Heart size and mediastinal contours are within normal limits.  IMPRESSION: Negative.     Assessment & Plan:   Precordial pain - Plan: DG Ribs Unilateral W/Chest Right, lidocaine (LIDODERM) 5 %, cyclobenzaprine (FLEXERIL) 5 MG tablet, traMADol (ULTRAM) 50 MG tablet  Nontraumatic shoulder pain, right  Costochondritis --suspect costochondritis as cause of chest pain --doubt cardiac cause with no risk factors, no exertional symptoms, chest pain is reproducible with palpation and constant for 5 days --doubt PE with negative PERC, normal  vitals  --will treat with lidoderm, flexeril for night, tramadol as needed, and continue home NSAID regimen --encourage daily PPI with NSAIDs and hydration  --chest xr normal today  --rtc 2 wks if no improvement with these measures   Julieta Gutting, PA-C Physician Assistant[facility]Cert[facility]  09/07/2015 11:09 AM

## 2015-10-10 DIAGNOSIS — R87612 Low grade squamous intraepithelial lesion on cytologic smear of cervix (LGSIL): Secondary | ICD-10-CM | POA: Diagnosis not present

## 2015-10-10 DIAGNOSIS — N390 Urinary tract infection, site not specified: Secondary | ICD-10-CM | POA: Diagnosis not present

## 2015-10-10 DIAGNOSIS — N879 Dysplasia of cervix uteri, unspecified: Secondary | ICD-10-CM | POA: Diagnosis not present

## 2015-10-10 MED FILL — CIPROFLOXACIN HCL 500 MG TA: 500 | 7 days supply | Qty: 14 | Fill #0

## 2015-11-06 DIAGNOSIS — K743 Primary biliary cirrhosis: Secondary | ICD-10-CM | POA: Diagnosis not present

## 2015-11-08 ENCOUNTER — Other Ambulatory Visit: Payer: Self-pay | Admitting: Gastroenterology

## 2015-11-08 DIAGNOSIS — K745 Biliary cirrhosis, unspecified: Secondary | ICD-10-CM

## 2015-11-19 ENCOUNTER — Ambulatory Visit
Admission: RE | Admit: 2015-11-19 | Discharge: 2015-11-19 | Disposition: A | Discharge status: Home / Self Care | Payer: 59 | Source: Ambulatory Visit | Attending: Gastroenterology | Admitting: Gastroenterology

## 2015-11-19 DIAGNOSIS — K7460 Unspecified cirrhosis of liver: Secondary | ICD-10-CM | POA: Diagnosis not present

## 2015-11-19 DIAGNOSIS — K745 Biliary cirrhosis, unspecified: Secondary | ICD-10-CM

## 2015-11-20 DIAGNOSIS — R350 Frequency of micturition: Secondary | ICD-10-CM | POA: Diagnosis not present

## 2015-11-20 MED FILL — NITROFURANTOIN MCR 100 MG C: 100 | 30 days supply | Qty: 30 | Fill #0

## 2015-11-20 MED FILL — SULFAMETHOXAZOLE/TMP DS TAB: 800-160 | 7 days supply | Qty: 14 | Fill #0

## 2015-11-27 DIAGNOSIS — H16223 Keratoconjunctivitis sicca, not specified as Sjogren's, bilateral: Secondary | ICD-10-CM | POA: Diagnosis not present

## 2015-11-27 DIAGNOSIS — H04123 Dry eye syndrome of bilateral lacrimal glands: Secondary | ICD-10-CM | POA: Diagnosis not present

## 2015-11-27 MED FILL — LOTEMAX 0.5% GEL: 0.5 | 14 days supply | Qty: 5 | Fill #0

## 2015-12-13 DIAGNOSIS — H16223 Keratoconjunctivitis sicca, not specified as Sjogren's, bilateral: Secondary | ICD-10-CM | POA: Diagnosis not present

## 2015-12-13 DIAGNOSIS — H04123 Dry eye syndrome of bilateral lacrimal glands: Secondary | ICD-10-CM | POA: Diagnosis not present

## 2015-12-13 DIAGNOSIS — H5213 Myopia, bilateral: Secondary | ICD-10-CM | POA: Diagnosis not present

## 2015-12-13 DIAGNOSIS — H52223 Regular astigmatism, bilateral: Secondary | ICD-10-CM | POA: Diagnosis not present

## 2015-12-13 DIAGNOSIS — H524 Presbyopia: Secondary | ICD-10-CM | POA: Diagnosis not present

## 2015-12-13 MED FILL — RESTASIS MULTIDOSE 0.05% EY: 0.05 | 30 days supply | Qty: 6 | Fill #0

## 2015-12-21 MED FILL — LOTEMAX 0.5% GEL: 0.5 | 14 days supply | Qty: 5 | Fill #1

## 2015-12-21 MED FILL — NITROFURANTOIN MCR 100 MG C: 100 | 30 days supply | Qty: 30 | Fill #1

## 2015-12-24 MED FILL — URSODIOL 300 MG CAPSULE: 300 | 90 days supply | Qty: 450 | Fill #0

## 2016-01-10 DIAGNOSIS — K743 Primary biliary cirrhosis: Secondary | ICD-10-CM | POA: Diagnosis not present

## 2016-01-11 MED FILL — CHOLESTYRAMINE PACKET: 4 | 30 days supply | Qty: 90 | Fill #0

## 2016-01-20 MED FILL — NITROFURANTOIN MCR 100 MG C: 100 | 30 days supply | Qty: 30 | Fill #2

## 2016-01-21 MED FILL — RESTASIS MULTIDOSE 0.05% EY: 0.05 | 30 days supply | Qty: 6 | Fill #1

## 2016-02-22 MED FILL — RESTASIS MULTIDOSE 0.05% EY: 0.05 | 30 days supply | Qty: 6 | Fill #2

## 2016-02-22 MED FILL — NITROFURANTOIN MCR 100 MG C: 100 | 30 days supply | Qty: 30 | Fill #3

## 2016-03-26 MED FILL — URSODIOL 300 MG CAPSULE: 300 | 90 days supply | Qty: 450 | Fill #1

## 2016-03-26 MED FILL — NITROFURANTOIN MCR 100 MG C: 100 | 30 days supply | Qty: 30 | Fill #4

## 2016-05-01 MED FILL — RESTASIS MULTIDOSE 0.05% EY: 0.05 | 30 days supply | Qty: 6 | Fill #3

## 2016-05-01 MED FILL — NITROFURANTOIN MCR 100 MG C: 100 | 30 days supply | Qty: 30 | Fill #5

## 2016-06-02 DIAGNOSIS — R87612 Low grade squamous intraepithelial lesion on cytologic smear of cervix (LGSIL): Secondary | ICD-10-CM | POA: Diagnosis not present

## 2016-06-02 DIAGNOSIS — Z01419 Encounter for gynecological examination (general) (routine) without abnormal findings: Secondary | ICD-10-CM | POA: Diagnosis not present

## 2016-06-02 DIAGNOSIS — N760 Acute vaginitis: Secondary | ICD-10-CM | POA: Diagnosis not present

## 2016-06-02 DIAGNOSIS — Z1231 Encounter for screening mammogram for malignant neoplasm of breast: Secondary | ICD-10-CM | POA: Diagnosis not present

## 2016-06-02 DIAGNOSIS — Z6830 Body mass index (BMI) 30.0-30.9, adult: Secondary | ICD-10-CM | POA: Diagnosis not present

## 2016-06-02 MED FILL — NITROFURANTOIN MCR 100 MG C: 100 | 30 days supply | Qty: 30 | Fill #6

## 2016-06-02 MED FILL — FLUCONAZOLE 150 MG TABLET: 150 | 1 days supply | Qty: 1 | Fill #0

## 2016-06-02 MED FILL — VANDAZOLE VAGINAL 0.75% GEL: 0.75 | 5 days supply | Qty: 70 | Fill #0

## 2016-06-10 MED FILL — XULANE PATCH: 150-35 | 28 days supply | Qty: 3 | Fill #0

## 2016-06-19 DIAGNOSIS — K743 Primary biliary cirrhosis: Secondary | ICD-10-CM | POA: Diagnosis not present

## 2016-07-03 MED FILL — RESTASIS MULTIDOSE 0.05% EY: 0.05 | 30 days supply | Qty: 6 | Fill #4

## 2016-07-03 MED FILL — NITROFURANTOIN MCR 100 MG C: 100 | 30 days supply | Qty: 30 | Fill #7

## 2016-07-03 MED FILL — XULANE PATCH: 150-35 | 28 days supply | Qty: 3 | Fill #1

## 2016-07-31 MED FILL — NITROFURANTOIN MCR 100 MG C: 100 | 30 days supply | Qty: 30 | Fill #8

## 2016-07-31 MED FILL — XULANE PATCH: 150-35 | 28 days supply | Qty: 3 | Fill #2

## 2016-09-03 MED FILL — NITROFURANTOIN MCR 100 MG C: 100 | 30 days supply | Qty: 30 | Fill #9

## 2016-09-03 MED FILL — URSODIOL 300 MG CAPSULE: 300 | 36 days supply | Qty: 180 | Fill #2

## 2016-09-03 MED FILL — XULANE PATCH: 150-35 | 28 days supply | Qty: 3 | Fill #3

## 2016-09-30 MED FILL — XULANE PATCH: 150-35 | 28 days supply | Qty: 3 | Fill #4

## 2016-09-30 MED FILL — NITROFURANTOIN MCR 100 MG C: 100 | 30 days supply | Qty: 30 | Fill #10

## 2016-10-29 MED FILL — NITROFURANTOIN MCR 100 MG C: 100 | 30 days supply | Qty: 30 | Fill #11

## 2016-10-29 MED FILL — XULANE PATCH: 150-35 | 28 days supply | Qty: 3 | Fill #5

## 2016-11-07 ENCOUNTER — Observation Stay (HOSPITAL_BASED_OUTPATIENT_CLINIC_OR_DEPARTMENT_OTHER): Payer: 59

## 2016-11-07 ENCOUNTER — Ambulatory Visit (HOSPITAL_BASED_OUTPATIENT_CLINIC_OR_DEPARTMENT_OTHER)
Admit: 2016-11-07 | Discharge: 2016-11-07 | Disposition: A | Discharge status: Home / Self Care | Payer: 59 | Source: Ambulatory Visit | Attending: Cardiology | Admitting: Cardiology

## 2016-11-07 ENCOUNTER — Observation Stay (HOSPITAL_BASED_OUTPATIENT_CLINIC_OR_DEPARTMENT_OTHER)
Admission: EM | Admit: 2016-11-07 | Discharge: 2016-11-07 | Disposition: A | Discharge status: Home / Self Care | Payer: 59 | Attending: Internal Medicine | Admitting: Internal Medicine

## 2016-11-07 ENCOUNTER — Emergency Department (HOSPITAL_BASED_OUTPATIENT_CLINIC_OR_DEPARTMENT_OTHER): Payer: 59

## 2016-11-07 ENCOUNTER — Encounter (HOSPITAL_BASED_OUTPATIENT_CLINIC_OR_DEPARTMENT_OTHER): Payer: Self-pay | Admitting: Emergency Medicine

## 2016-11-07 DIAGNOSIS — R079 Chest pain, unspecified: Secondary | ICD-10-CM

## 2016-11-07 DIAGNOSIS — K219 Gastro-esophageal reflux disease without esophagitis: Secondary | ICD-10-CM | POA: Insufficient documentation

## 2016-11-07 DIAGNOSIS — R9431 Abnormal electrocardiogram [ECG] [EKG]: Secondary | ICD-10-CM

## 2016-11-07 DIAGNOSIS — Z8249 Family history of ischemic heart disease and other diseases of the circulatory system: Secondary | ICD-10-CM | POA: Insufficient documentation

## 2016-11-07 DIAGNOSIS — K743 Primary biliary cirrhosis: Secondary | ICD-10-CM | POA: Diagnosis not present

## 2016-11-07 DIAGNOSIS — R0789 Other chest pain: Principal | ICD-10-CM | POA: Insufficient documentation

## 2016-11-07 DIAGNOSIS — F1010 Alcohol abuse, uncomplicated: Secondary | ICD-10-CM | POA: Insufficient documentation

## 2016-11-07 LAB — COMPREHENSIVE METABOLIC PANEL
ALT: 102 U/L — ABNORMAL HIGH (ref 14–54)
AST: 95 U/L — AB (ref 15–41)
Albumin: 3.4 g/dL — ABNORMAL LOW (ref 3.5–5.0)
Alkaline Phosphatase: 161 U/L — ABNORMAL HIGH (ref 38–126)
Anion gap: 8 (ref 5–15)
BUN: 9 mg/dL (ref 6–20)
CALCIUM: 8.5 mg/dL — AB (ref 8.9–10.3)
CO2: 24 mmol/L (ref 22–32)
CREATININE: 0.55 mg/dL (ref 0.44–1.00)
Chloride: 100 mmol/L — ABNORMAL LOW (ref 101–111)
GFR calc Af Amer: >60 mL/min (ref >60)
Glucose, Bld: 109 mg/dL — ABNORMAL HIGH (ref 65–99)
Potassium: 3.5 mmol/L (ref 3.5–5.1)
Sodium: 132 mmol/L — ABNORMAL LOW (ref 135–145)
TOTAL PROTEIN: 7.4 g/dL (ref 6.5–8.1)
Total Bilirubin: 2.9 mg/dL — ABNORMAL HIGH (ref 0.3–1.2)

## 2016-11-07 LAB — CBC WITH DIFFERENTIAL/PLATELET
BASOS ABS: 0 10*3/uL (ref 0.0–0.1)
Basophils Relative: 0 %
EOS ABS: 0.2 10*3/uL (ref 0.0–0.7)
EOS PCT: 2 %
HCT: 34.7 % — ABNORMAL LOW (ref 36.0–46.0)
Hemoglobin: 12.2 g/dL (ref 12.0–15.0)
Lymphocytes Relative: 30 %
Lymphs Abs: 2 10*3/uL (ref 0.7–4.0)
MCH: 32.6 pg (ref 26.0–34.0)
MCHC: 35.2 g/dL (ref 30.0–36.0)
MCV: 92.8 fL (ref 78.0–100.0)
MONO ABS: 0.7 10*3/uL (ref 0.1–1.0)
Monocytes Relative: 11 %
Neutro Abs: 3.9 10*3/uL (ref 1.7–7.7)
Neutrophils Relative %: 57 %
PLATELETS: 169 10*3/uL (ref 150–400)
RBC: 3.74 MIL/uL — ABNORMAL LOW (ref 3.87–5.11)
RDW: 13 % (ref 11.5–15.5)
WBC: 6.7 10*3/uL (ref 4.0–10.5)

## 2016-11-07 LAB — TROPONIN I
Troponin I: <0.03 ng/mL (ref <0.03)
Troponin I: 0.03 ng/mL (ref <0.03)

## 2016-11-07 LAB — CBC
HEMATOCRIT: 32.2 % — AB (ref 36.0–46.0)
HEMOGLOBIN: 11.2 g/dL — AB (ref 12.0–15.0)
MCH: 31.9 pg (ref 26.0–34.0)
MCHC: 34.8 g/dL (ref 30.0–36.0)
MCV: 91.7 fL (ref 78.0–100.0)
Platelets: 147 10*3/uL — ABNORMAL LOW (ref 150–400)
RBC: 3.51 MIL/uL — ABNORMAL LOW (ref 3.87–5.11)
RDW: 13.7 % (ref 11.5–15.5)
WBC: 5 10*3/uL (ref 4.0–10.5)

## 2016-11-07 LAB — LIPASE, BLOOD: Lipase: 70 U/L — ABNORMAL HIGH (ref 11–51)

## 2016-11-07 LAB — CREATININE, SERUM
Creatinine, Ser: 0.4 mg/dL — ABNORMAL LOW (ref 0.44–1.00)
GFR calc Af Amer: >60 mL/min (ref >60)
GFR calc non Af Amer: >60 mL/min (ref >60)

## 2016-11-07 LAB — D-DIMER, QUANTITATIVE: D-Dimer, Quant: 1.03 ug/mL-FEU — ABNORMAL HIGH (ref 0.00–0.50)

## 2016-11-07 LAB — ECHOCARDIOGRAM COMPLETE
Height: 62 in
Weight: 2709.01 oz

## 2016-11-07 LAB — HIV ANTIBODY (ROUTINE TESTING W REFLEX): HIV SCREEN 4TH GENERATION: NONREACTIVE

## 2016-11-07 MED ORDER — NITROGLYCERIN 0.4 MG SL SUBL: 0.4000 mg | SUBLINGUAL_TABLET | SUBLINGUAL | Status: DC | PRN

## 2016-11-07 MED ORDER — NITROGLYCERIN 0.4 MG SL SUBL
0.4000 mg | SUBLINGUAL_TABLET | SUBLINGUAL | Status: AC | PRN
  Administered 2016-11-07 (×3): 0.4 mg via SUBLINGUAL
  Filled 2016-11-07: qty 1

## 2016-11-07 MED ORDER — ACETAMINOPHEN 325 MG PO TABS: 650.0000 mg | ORAL_TABLET | ORAL | Status: DC | PRN

## 2016-11-07 MED ORDER — CYCLOSPORINE 0.05 % OP EMUL
1.0000 [drp] | OPHTHALMIC | Status: DC
  Administered 2016-11-07: 1 [drp] via OPHTHALMIC
  Filled 2016-11-07: qty 1

## 2016-11-07 MED ORDER — VITAMIN D3 25 MCG (1000 UNIT) PO TABS: 2000.0000 [IU] | ORAL_TABLET | Freq: Every day | ORAL | Status: DC

## 2016-11-07 MED ORDER — TECHNETIUM TC 99M TETROFOSMIN IV KIT
10.0000 | PACK | Freq: Once | INTRAVENOUS | Status: AC | PRN
  Administered 2016-11-07: 10 via INTRAVENOUS

## 2016-11-07 MED ORDER — ADULT MULTIVITAMIN W/MINERALS CH: 1.0000 | ORAL_TABLET | Freq: Every day | ORAL | Status: DC

## 2016-11-07 MED ORDER — ASPIRIN EC 325 MG PO TBEC: 325.0000 mg | DELAYED_RELEASE_TABLET | Freq: Every day | ORAL | Status: DC

## 2016-11-07 MED ORDER — URSODIOL 300 MG PO CAPS
600.0000 mg | ORAL_CAPSULE | Freq: Every day | ORAL | Status: DC
  Filled 2016-11-07: qty 2

## 2016-11-07 MED ORDER — NITROFURANTOIN MONOHYD MACRO 100 MG PO CAPS
100.0000 mg | ORAL_CAPSULE | Freq: Every day | ORAL | Status: DC
  Filled 2016-11-07: qty 1

## 2016-11-07 MED ORDER — IOPAMIDOL (ISOVUE-370) INJECTION 76%: 100.0000 mL | Freq: Once | INTRAVENOUS | Status: DC | PRN

## 2016-11-07 MED ORDER — ONDANSETRON HCL 4 MG/2ML IJ SOLN: 4.0000 mg | Freq: Four times a day (QID) | INTRAMUSCULAR | Status: DC | PRN

## 2016-11-07 MED ORDER — LIDOCAINE 5 % EX PTCH
1.0000 | MEDICATED_PATCH | CUTANEOUS | Status: DC
  Filled 2016-11-07: qty 1

## 2016-11-07 MED ORDER — TECHNETIUM TC 99M TETROFOSMIN IV KIT
30.0000 | PACK | Freq: Once | INTRAVENOUS | Status: AC | PRN
  Administered 2016-11-07: 30 via INTRAVENOUS

## 2016-11-07 MED ORDER — ASPIRIN 81 MG PO CHEW
324.0000 mg | CHEWABLE_TABLET | Freq: Once | ORAL | Status: AC
  Administered 2016-11-07: 324 mg via ORAL
  Filled 2016-11-07: qty 4

## 2016-11-07 MED ORDER — URSODIOL 300 MG PO CAPS
900.0000 mg | ORAL_CAPSULE | Freq: Every day | ORAL | Status: DC
  Filled 2016-11-07: qty 3

## 2016-11-07 MED ORDER — ENOXAPARIN SODIUM 40 MG/0.4ML ~~LOC~~ SOLN
40.0000 mg | SUBCUTANEOUS | Status: DC
  Administered 2016-11-07: 40 mg via SUBCUTANEOUS
  Filled 2016-11-07: qty 0.4

## 2016-11-07 NOTE — ED Notes (Signed)
Alert, NAD, calm, interactive, resps e/u, speaking in clear complete sentences, no dyspnea noted, skin W&D, VSS, here for CP, indigestion and bilateral arm pain, (denies: sob, nausea, dizziness or visual changes). Family at [facility].

## 2016-11-07 NOTE — H&P (Signed)
History and Physical    BETTYE SITTON HDQ:[id number] DOB: [birth year] DOA: 11/07/2016  PCP: [name], MD  Patient coming from: Home.  Chief Complaint: Chest pain.  HPI: Christine Butler is a 46 y.o. female with history of primary biliary cirrhosis presents to the ER with complaints of chest pain. Last evening around 9 PM patient started developing chest pain which was retrosternal sharp shooting. Eventually patient's chest pain started radiating to both arms. Patient tried PPI followed by ibuprofen despite which patient's chest pain did not improve. Since it was radiating patient was concerned and came to ER.   ED Course: In the ER patient's chest pain was relieved by 3 sublingual nitroglycerin and is presently chest pain-free. EKG shows T-wave inversions in anterior leads. Cardiac markers were negative d-dimer was elevated and CT angiogram of the chest was unremarkable.  Review of Systems: As per HPI, rest all negative.   Past Medical History:  Diagnosis Date  . Arthritis    right knee  . GERD (gastroesophageal reflux disease)   . IBS (irritable bowel syndrome)   . PONV (postoperative nausea and vomiting)   . Primary biliary cholangitis ([place])   . Primary biliary cirrhosis ([place])   . SVD (spontaneous vaginal delivery)    x 3  . Urinary tract bacterial infections     Past Surgical History:  Procedure Laterality Date  . INCONTINENCE SURGERY    . LEEP N/A 03/28/2014   Procedure: LOOP ELECTROSURGICAL EXCISION PROCEDURE (LEEP) cone biopsy;  Surgeon: Cyril Mourning, MD;  Location: [facility] [place] [facility];  Service: Gynecology;  Laterality: N/A;  . LIVER BIOPSY     x 2  . WISDOM TOOTH EXTRACTION       reports that she has never smoked. She has never used smokeless tobacco. She reports that she drinks about 0.6 oz of alcohol per week . She reports that she does not use drugs.  Allergies  Allergen Reactions  . Codeine Nausea And Vomiting  . Erythromycin Nausea And Vomiting    Family History    Problem Relation Age of Onset  . CAD Father   . Diabetes Mellitus II Father     Prior to Admission medications   Medication Sig Start Date End Date Taking? Authorizing Provider  Calcium Carbonate-Vitamin D (CALCIUM + D PO) Take 1 tablet by mouth daily.   Yes [provider]  cholecalciferol (VITAMIN D) 1000 units tablet Take 2,000 Units by mouth daily.   Yes [provider]  cycloSPORINE (RESTASIS) 0.05 % ophthalmic emulsion Place 1 drop into both eyes every other day.   Yes [provider]  ibuprofen (ADVIL,MOTRIN) 200 MG tablet Take 600 mg by mouth every 6 (six) hours as needed for headache or mild pain.   Yes [provider]  lidocaine (LIDODERM) 5 % Place 1 patch onto the skin daily. Remove & Discard patch within 12 hours or as directed by MD 09/07/15  Yes McVeigh, Sherren Mocha, PA  LYSINE PO Take 1 tablet by mouth daily.   Yes [provider]  Multiple Vitamin (MULTIVITAMIN WITH MINERALS) TABS Take 1 tablet by mouth daily.   Yes [provider]  nitrofurantoin, macrocrystal-monohydrate, (MACROBID) 100 MG capsule Take 100 mg by mouth daily.   Yes [provider]  ursodiol (ACTIGALL) 300 MG capsule Take 600-900 mg by mouth 2 (two) times daily. 900mg  in am, 600mg  in pm   Yes [provider]  Marilu Favre 150-35 MCG/24HR transdermal patch Place 1 patch onto the skin every  7 (seven) days. On sundays 10/29/16  Yes [provider]  cyclobenzaprine (FLEXERIL) 5 MG tablet Take 1 tablet (5 mg total) by mouth 3 (three) times daily as needed for muscle spasms. 09/07/15   McVeigh, Todd, PA  MELATONIN PO Take 1 tablet by mouth at bedtime.    [provider]  traMADol (ULTRAM) 50 MG tablet Take 1 tablet (50 mg total) by mouth every 8 (eight) hours as needed. 09/07/15   McVeigh, Todd, PA  vitamin B-12 (CYANOCOBALAMIN) 1000 MCG tablet Take 1,000 mcg by mouth daily.    [provider]    Physical Exam: Vitals:   11/07/16  0300 11/07/16 0330 11/07/16 0400 11/07/16 0500  BP: 125/82 120/79  (!) 156/80  Pulse: 80 81  84  Resp: 16 20  20   Temp:    98.2 F (36.8 C)  TempSrc:    Oral  SpO2: 98% 98%  99%  Weight:   76.8 kg (169 lb 5 oz)   Height:   5\' 2"  (1.575 m)       Constitutional: Moderately built and nourished. Vitals:   11/07/16 0300 11/07/16 0330 11/07/16 0400 11/07/16 0500  BP: 125/82 120/79  (!) 156/80  Pulse: 80 81  84  Resp: 16 20  20   Temp:    98.2 F (36.8 C)  TempSrc:    Oral  SpO2: 98% 98%  99%  Weight:   76.8 kg (169 lb 5 oz)   Height:   5\' 2"  (1.575 m)    Eyes: Anicteric no pallor. ENMT: No discharge from the ears eyes nose and mouth. Neck: No mass felt. No neck rigidity. No JVD appreciated. Respiratory: No rhonchi or crepitations. Cardiovascular: S1-S2 heard no murmurs appreciated. Abdomen: Soft nontender bowel sounds present. No guarding or rigidity. Musculoskeletal: No edema. No joint effusion. Skin: No rash. Skin appears warm. Neurologic: Alert awake oriented to time place and person. Moves all extremities. Psychiatric: Appears normal. Normal affect.   Labs on Admission: I have personally reviewed following labs and imaging studies  CBC:  Recent Labs Lab 11/07/16 0105  WBC 6.7  NEUTROABS 3.9  HGB 12.2  HCT 34.7*  MCV 92.8  PLT 540   Basic Metabolic Panel:  Recent Labs Lab 11/07/16 0105  NA 132*  K 3.5  CL 100*  CO2 24  GLUCOSE 109*  BUN 9  CREATININE 0.55  CALCIUM 8.5*   GFR: Estimated Creatinine Clearance: 84.3 mL/min (by C-G formula based on SCr of 0.55 mg/dL). Liver Function Tests:  Recent Labs Lab 11/07/16 0105  AST 95*  ALT 102*  ALKPHOS 161*  BILITOT 2.9*  PROT 7.4  ALBUMIN 3.4*    Recent Labs Lab 11/07/16 0105  LIPASE 70*   No results for input(s): AMMONIA in the last 168 hours. Coagulation Profile: No results for input(s): INR, PROTIME in the last 168 hours. Cardiac Enzymes:  Recent Labs Lab 11/07/16 0105  TROPONINI  <0.03   BNP (last 3 results) No results for input(s): PROBNP in the last 8760 hours. HbA1C: No results for input(s): HGBA1C in the last 72 hours. CBG: No results for input(s): GLUCAP in the last 168 hours. Lipid Profile: No results for input(s): CHOL, HDL, LDLCALC, TRIG, CHOLHDL, LDLDIRECT in the last 72 hours. Thyroid Function Tests: No results for input(s): TSH, T4TOTAL, FREET4, T3FREE, THYROIDAB in the last 72 hours. Anemia Panel: No results for input(s): VITAMINB12, FOLATE, FERRITIN, TIBC, IRON, RETICCTPCT in the last 72 hours. Urine analysis:    Component Value Date/Time  COLORURINE YELLOW 06/10/2008 1855   APPEARANCEUR CLEAR 06/10/2008 1855   LABSPEC 1.013 06/10/2008 1855   PHURINE 6.0 06/10/2008 1855   GLUCOSEU NEGATIVE 06/10/2008 1855   HGBUR NEGATIVE 06/10/2008 1855   BILIRUBINUR small 08/04/2013 0836   KETONESUR negative 08/04/2013 0836   KETONESUR NEGATIVE 06/10/2008 1855   PROTEINUR =30 08/04/2013 0836   PROTEINUR NEGATIVE 06/10/2008 1855   UROBILINOGEN 1.0 08/04/2013 0836   UROBILINOGEN 1.0 06/10/2008 1855   NITRITE Positive 08/04/2013 0836   NITRITE NEGATIVE 06/10/2008 1855   LEUKOCYTESUR small (1+) 08/04/2013 0836   Sepsis Labs: @LABRCNTIP (procalcitonin:4,lacticidven:4) )No results found for this or any previous visit (from the past 240 hour(s)).   Radiological Exams on Admission: Dg Chest 2 View  Result Date: 11/07/2016 CLINICAL DATA:  Chest pain for 5 hours. EXAM: CHEST  2 VIEW COMPARISON:  Single-view of the chest 09/07/2015. FINDINGS: The lungs are clear. Heart size is normal. No pneumothorax or pleural fluid. No bony abnormality. IMPRESSION: No acute disease. Electronically Signed   By: Inge Rise M.D.   On: 11/07/2016 01:57   Ct Angio Chest Pe W And/or Wo Contrast  Result Date: 11/07/2016 CLINICAL DATA:  Substernal chest pain for 7 hours. EXAM: CT ANGIOGRAPHY CHEST WITH CONTRAST TECHNIQUE: Multidetector CT imaging of the chest was performed  using the standard protocol during bolus administration of intravenous contrast. Multiplanar CT image reconstructions and MIPs were obtained to evaluate the vascular anatomy. CONTRAST:  100 cc Isovue 370 IV COMPARISON:  Chest radiograph earlier this day. FINDINGS: Cardiovascular: There are no filling defects within the pulmonary arteries to suggest pulmonary embolus. Thoracic aorta is normal in caliber without dissection. Heart is normal in size. No pericardial effusion. Mediastinum/Nodes: No mediastinal, hilar, or axillary adenopathy. The esophagus is decompressed. No thyroid nodule. Trachea and mainstem bronchi are patent. Lungs/Pleura: Clear lungs. No consolidation. No pulmonary edema or pleural fluid. No pulmonary mass. Calcified granuloma in the left lower lobe. Upper Abdomen: No acute abnormality. Musculoskeletal: There are no acute or suspicious osseous abnormalities. Review of the MIP images confirms the above findings. IMPRESSION: No pulmonary embolus or acute intrathoracic abnormality. Electronically Signed   By: Jeb Levering M.D.   On: 11/07/2016 05:34    EKG: Independently reviewed. Normal sinus rhythm with T-wave inversion in anterior leads.  Assessment/Plan Principal Problem:   Chest pain Active Problems:   GERD (gastroesophageal reflux disease)   Primary biliary cirrhosis ([place])    1. Chest pain - pain were relieved by sublingual nitroglycerin and also T-wave inversions in anterior leads. We'll cycle cardiac markers to rule out ACS. Check 2-D echo. I have requested cardiology consult. 2. Primary biliary cirrhosis - on ursodiol. 3. History of GERD on PPI.   DVT prophylaxis: Lovenox. Code Status: Full code.  Family Communication: Discussed with patient.  Disposition Plan: Home.  Consults called: Cardiology.  Admission status: Observation.    Rise Patience MD [facility] Pager [phone].  If 7PM-7AM, please contact night-coverage [email or web address] Password  [facility]  11/07/2016, 6:32 AM

## 2016-11-07 NOTE — ED Notes (Signed)
Back from CT, no changes, [facility] here for transport

## 2016-11-07 NOTE — Consult Note (Signed)
Cardiology Consultation:   Patient ID: Christine Butler; [id number]; [birth year]   Admit date: 11/07/2016 Date of Consult: 11/07/2016  Primary Care Provider: [name], MD Primary Cardiologist: New (Dr. Radford Pax)   Patient Profile:   Christine Butler is a 46 y.o. female with a hx of primary biliary cholangitis (autoimmune), GERD and IBS and family h/o CAD (father w/ CABG in late 60s), who is being seen today for the evaluation of chest pain at the request of Dr. Maylene Roes, Internal Medicine.  History of Present Illness:   Pt was in her usual state of health until last night. Around 5PM, she had a large meal consisting of steak, potatoes, asparagus, creme brulee and red wine. Several hours later at 9PM, she developed substernal chest pressure, occurring at rest, that radiated up her neck, bilateral jaw and down both arms. She initially thought it was acid reflux/indigestion, however no improvement with antacids. Pt took Prevacid, which usually works well for her, however no improvement. She denies any associated dyspnea and symptoms did not worsen with exertion. Given the nature of her symptoms, she went to med [facility]. She was given SL NTG x 3 and pain resolved after 3rd dose. She was sent to [facility] for further w/u.   In ED, d-dimer was elevated at 1.03, however chest CT negative for PE. No dissection. Troponin negative x 1. EKG shows NSR with minimal diffuse ST depressions, new compared to prior EKG from 2013. 2D echo pending. Additional labs show mild anemia with hgb at 11.2, hyponatremia at 132 and hypocalcimia at 8.5. Pt noted to be hypertensive in ED. BP in the 785Y-850Y systolic. Not on any BP meds at home. IM medicine admitted pt to telemetry.   She is currently CP free. Most recent BP elevated at 156/80. She is NPO.     Past Medical History:  Diagnosis Date  . Arthritis    right knee  . GERD (gastroesophageal reflux disease)   . IBS (irritable bowel syndrome)   . PONV (postoperative nausea  and vomiting)   . Primary biliary cholangitis ([place])   . Primary biliary cirrhosis ([place])   . SVD (spontaneous vaginal delivery)    x 3  . Urinary tract bacterial infections     Past Surgical History:  Procedure Laterality Date  . INCONTINENCE SURGERY    . LEEP N/A 03/28/2014   Procedure: LOOP ELECTROSURGICAL EXCISION PROCEDURE (LEEP) cone biopsy;  Surgeon: Cyril Mourning, MD;  Location: [place] [facility];  Service: Gynecology;  Laterality: N/A;  . LIVER BIOPSY     x 2  . WISDOM TOOTH EXTRACTION       Home Medications:  Prior to Admission medications   Medication Sig Start Date End Date Taking? Authorizing Provider  Calcium Carbonate-Vitamin D (CALCIUM + D PO) Take 1 tablet by mouth daily.   Yes [provider]  cholecalciferol (VITAMIN D) 1000 units tablet Take 2,000 Units by mouth daily.   Yes [provider]  cycloSPORINE (RESTASIS) 0.05 % ophthalmic emulsion Place 1 drop into both eyes every other day.   Yes [provider]  ibuprofen (ADVIL,MOTRIN) 200 MG tablet Take 600 mg by mouth every 6 (six) hours as needed for headache or mild pain.   Yes [provider]  lidocaine (LIDODERM) 5 % Place 1 patch onto the skin daily. Remove & Discard patch within 12 hours or as directed by MD 09/07/15  Yes McVeigh, Sherren Mocha, PA  LYSINE PO Take 1 tablet by mouth daily.  Yes [provider]  Multiple Vitamin (MULTIVITAMIN WITH MINERALS) TABS Take 1 tablet by mouth daily.   Yes [provider]  nitrofurantoin, macrocrystal-monohydrate, (MACROBID) 100 MG capsule Take 100 mg by mouth daily.   Yes [provider]  ursodiol (ACTIGALL) 300 MG capsule Take 600-900 mg by mouth 2 (two) times daily. 900mg  in am, 600mg  in pm   Yes [provider]  Marilu Favre 150-35 MCG/24HR transdermal patch Place 1 patch onto the skin every 7 (seven) days. On sundays 10/29/16  Yes [provider]  cyclobenzaprine (FLEXERIL) 5 MG tablet Take 1 tablet (5 mg total) by  mouth 3 (three) times daily as needed for muscle spasms. 09/07/15   McVeigh, Todd, PA  MELATONIN PO Take 1 tablet by mouth at bedtime.    [provider]  traMADol (ULTRAM) 50 MG tablet Take 1 tablet (50 mg total) by mouth every 8 (eight) hours as needed. 09/07/15   McVeigh, Todd, PA  vitamin B-12 (CYANOCOBALAMIN) 1000 MCG tablet Take 1,000 mcg by mouth daily.    [provider]    Inpatient Medications: Scheduled Meds: . aspirin EC  325 mg Oral Daily  . cholecalciferol  2,000 Units Oral Daily  . cycloSPORINE  1 drop Both Eyes QODAY  . enoxaparin (LOVENOX) injection  40 mg Subcutaneous Q24H  . lidocaine  1 patch Transdermal Q24H  . multivitamin with minerals  1 tablet Oral Daily  . nitrofurantoin (macrocrystal-monohydrate)  100 mg Oral Daily  . ursodiol  600 mg Oral QHS  . ursodiol  900 mg Oral Daily   Continuous Infusions:  PRN Meds: acetaminophen, nitroGLYCERIN, ondansetron (ZOFRAN) IV  Allergies:    Allergies  Allergen Reactions  . Codeine Nausea And Vomiting  . Erythromycin Nausea And Vomiting    Social History:   Social History   Social History  . Marital status: Divorced    Spouse name: N/A  . Number of children: N/A  . Years of education: N/A   Occupational History  . Not on file.   Social History Main Topics  . Smoking status: Never Smoker  . Smokeless tobacco: Never Used  . Alcohol use 0.6 oz/week    1 Glasses of wine per week     Comment: for dinner  . Drug use: No  . Sexual activity: Not on file   Other Topics Concern  . Not on file   Social History Narrative  . No narrative on file    Family History:    Family History  Problem Relation Age of Onset  . CAD Father   . Diabetes Mellitus II Father      ROS:  Please see the history of present illness.  ROS  All other ROS reviewed and negative.     Physical Exam/Data:   Vitals:   11/07/16 0300 11/07/16 0330 11/07/16 0400 11/07/16 0500  BP: 125/82 120/79  (!) 156/80    Pulse: 80 81  84  Resp: 16 20  20  Temp:    98.2 F (36.8 C)  TempSrc:    Oral  SpO2: 98% 98%  99%  Weight:   169 lb 5 oz (76.8 kg)   Height:   5\' 2" (1.575 m)     Intake/Output Summary (Last 24 hours) at 11/07/16 0738 Last data filed at 11/07/16 0500  Gross per 24 hour  Intake              24 0 ml  Output  0 ml  Net              240 ml   Filed Weights   11/07/16 0048 11/07/16 0400  Weight: 165 lb (74.8 kg) 169 lb 5 oz (76.8 kg)   Body mass index is 30.97 kg/m.  General:  Well nourished, well developed, in no acute distress HEENT: normal Lymph: no adenopathy Neck: no JVD Endocrine:  No thryomegaly Vascular: No carotid bruits; FA pulses 2+ bilaterally without bruits  Cardiac:  normal S1, S2; RRR; no murmur  Lungs:  clear to auscultation bilaterally, no wheezing, rhonchi or rales  Abd: soft, nontender, no hepatomegaly  Ext: no edema Musculoskeletal:  No deformities, BUE and BLE strength normal and equal Skin: warm and dry  Neuro:  CNs 2-12 intact, no focal abnormalities noted Psych:  Normal affect   EKG:  The EKG was personally reviewed and demonstrates:  NSR with diffuse ST depression, new compared to EKG from 2013 Telemetry:  Telemetry was personally reviewed and demonstrates:  NSR Relevant CV Studies: 2D echo pending   Laboratory Data:  Chemistry Recent Labs Lab 11/07/16 0105  NA 132*  K 3.5  CL 100*  CO2 24  GLUCOSE 109*  BUN 9  CREATININE 0.55  CALCIUM 8.5*  GFRNONAA >60  GFRAA >60  ANIONGAP 8     Recent Labs Lab 11/07/16 0105  PROT 7.4  ALBUMIN 3.4*  AST 95*  ALT 102*  ALKPHOS 161*  BILITOT 2.9*   Hematology Recent Labs Lab 11/07/16 0105 11/07/16 0647  WBC 6.7 5.0  RBC 3.74* 3.51*  HGB 12.2 11.2*  HCT 34.7* 32.2*  MCV 92.8 91.7  MCH 32.6 31.9  MCHC 35.2 34.8  RDW 13.0 13.7  PLT 169 147*   Cardiac Enzymes Recent Labs Lab 11/07/16 0105  TROPONINI <0.03   No results for input(s): TROPIPOC in the last 168  hours.  BNPNo results for input(s): BNP, PROBNP in the last 168 hours.  DDimer  Recent Labs Lab 11/07/16 0105  DDIMER 1.03*    Radiology/Studies:  Dg Chest 2 View  Result Date: 11/07/2016 CLINICAL DATA:  Chest pain for 5 hours. EXAM: CHEST  2 VIEW COMPARISON:  Single-view of the chest 09/07/2015. FINDINGS: The lungs are clear. Heart size is normal. No pneumothorax or pleural fluid. No bony abnormality. IMPRESSION: No acute disease. Electronically Signed   By: Inge Rise M.D.   On: 11/07/2016 01:57   Ct Angio Chest Pe W And/or Wo Contrast  Result Date: 11/07/2016 CLINICAL DATA:  Substernal chest pain for 7 hours. EXAM: CT ANGIOGRAPHY CHEST WITH CONTRAST TECHNIQUE: Multidetector CT imaging of the chest was performed using the standard protocol during bolus administration of intravenous contrast. Multiplanar CT image reconstructions and MIPs were obtained to evaluate the vascular anatomy. CONTRAST:  100 cc Isovue 370 IV COMPARISON:  Chest radiograph earlier this day. FINDINGS: Cardiovascular: There are no filling defects within the pulmonary arteries to suggest pulmonary embolus. Thoracic aorta is normal in caliber without dissection. Heart is normal in size. No pericardial effusion. Mediastinum/Nodes: No mediastinal, hilar, or axillary adenopathy. The esophagus is decompressed. No thyroid nodule. Trachea and mainstem bronchi are patent. Lungs/Pleura: Clear lungs. No consolidation. No pulmonary edema or pleural fluid. No pulmonary mass. Calcified granuloma in the left lower lobe. Upper Abdomen: No acute abnormality. Musculoskeletal: There are no acute or suspicious osseous abnormalities. Review of the MIP images confirms the above findings. IMPRESSION: No pulmonary embolus or acute intrathoracic abnormality. Electronically Signed   By: Jeb Levering  M.D.   On: 11/07/2016 05:34    Assessment and Plan:   1. Chest Pain: DD includes ACS vs GERD. Chest CT negative for PE and dissection. Initial  troponin was <0.03. 2nd troponin is 0.03.  EKG with ST abnormalities, mild diffuse ST depressions which are new in comparison to previous EKG from 2013. 2D echo pending. Given her symptoms and EKG abnormalities, will plan for ischemic testing to r/o CAD. Keep NPO for likely stress test today. Continue to monitor BP. May need to consider addition of an antihypertensive if SBP remains persistently elevated. Recommend checking a fasting lipid panel and HgbA1c for screening of other cardiac risk factors, HLD and DM.    For questions or updates, please contact [place] Please consult [email or web address] for contact info under Cardiology/STEMI.   Signed, Lyda Jester, PA-C  11/07/2016 7:38 AM

## 2016-11-07 NOTE — Progress Notes (Signed)
  Echocardiogram 2D Echocardiogram has been performed.  Donata Clay 11/07/2016, 2:56 PM

## 2016-11-07 NOTE — ED Triage Notes (Signed)
Low sternal CP at 9pm.  Took GERD med.  Woke up at 1030pm with bil arm pain that radiated up both shoulders, both sides of neck and into both temples.

## 2016-11-07 NOTE — ED Provider Notes (Addendum)
TIME SEEN: 1:12 AM  CHIEF COMPLAINT: Chest pain  HPI: Patient is a 46 year old female with history of primary biliary cholangitis who presents emergency room and with development of central chest pain that she felt like indigestion and was a burning pain that started at 9 PM. States she took Prevacid without any relief which was bizarre for her. She states later she start having intense throbbing pain in both of her upper arms and the pain radiated up into her neck and into her temples. She states with this episode she felt nauseated but no shortness of breath, dizziness or diaphoresis. No vomiting. She states that she is still having pain in both arms. She reports she's had a previous stress test years ago when she was told she had EKG abnormalities. She does not room or with these abnormalities were. No history of cardiac catheterization. No history of PE or DVT. No aggravating or relieving factors. States that she felt like she was "punched" in the chest. Family history a father who had 2 vessel CABG in his late 50s. She is not a smoker.  ROS: See HPI Constitutional: no fever  Eyes: no drainage  ENT: no runny nose   Cardiovascular:   chest pain  Resp: no SOB  GI: no vomiting GU: no dysuria Integumentary: no rash  Allergy: no hives  Musculoskeletal: no leg swelling  Neurological: no slurred speech ROS otherwise negative  PAST MEDICAL HISTORY/PAST SURGICAL HISTORY:  Past Medical History:  Diagnosis Date  . Arthritis    right knee  . GERD (gastroesophageal reflux disease)   . IBS (irritable bowel syndrome)   . PONV (postoperative nausea and vomiting)   . Primary biliary cholangitis ([place])   . Primary biliary cirrhosis ([place])   . SVD (spontaneous vaginal delivery)    x 3  . Urinary tract bacterial infections     MEDICATIONS:  Prior to Admission medications   Medication Sig Start Date End Date Taking? Authorizing Provider  nitrofurantoin, macrocrystal-monohydrate, (MACROBID) 100 MG  capsule Take 100 mg by mouth daily.   Yes [provider]  ursodiol (ACTIGALL) 300 MG capsule Take 600-900 mg by mouth 2 (two) times daily. 900mg  in am, 600mg  in pm   Yes [provider]  Calcium Carbonate-Vitamin D (CALCIUM + D PO) Take 1 tablet by mouth daily.    [provider]  cyclobenzaprine (FLEXERIL) 5 MG tablet Take 1 tablet (5 mg total) by mouth 3 (three) times daily as needed for muscle spasms. 09/07/15   McVeigh, Todd, PA  fexofenadine (ALLEGRA) 180 MG tablet Take 180 mg by mouth daily.    [provider]  ibuprofen (ADVIL,MOTRIN) 200 MG tablet Take 600 mg by mouth every 6 (six) hours as needed for headache or mild pain.    [provider]  lidocaine (LIDODERM) 5 % Place 1 patch onto the skin daily. Remove & Discard patch within 12 hours or as directed by MD 09/07/15   McVeigh, Sherren Mocha, PA  MELATONIN PO Take 1 tablet by mouth at bedtime.    [provider]  Multiple Vitamin (MULTIVITAMIN WITH MINERALS) TABS Take 1 tablet by mouth daily.    [provider]  omeprazole (PRILOSEC) 20 MG capsule Take 20 mg by mouth daily as needed (acid reflux).    [provider]  traMADol (ULTRAM) 50 MG tablet Take 1 tablet (50 mg total) by mouth every 8 (eight) hours as needed. 09/07/15   McVeigh, Todd, PA  vitamin B-12 (CYANOCOBALAMIN) 1000 MCG tablet Take  1,000 mcg by mouth daily.    [provider]    ALLERGIES:  Allergies  Allergen Reactions  . Codeine Nausea And Vomiting  . Erythromycin Nausea And Vomiting    SOCIAL HISTORY:  Social History  Substance Use Topics  . Smoking status: Never Smoker  . Smokeless tobacco: Never Used  . Alcohol use 0.6 oz/week    1 Glasses of wine per week     Comment: for dinner    FAMILY HISTORY: No family history on file.  EXAM: BP (!) 147/84 (BP Location: Left Arm)   Pulse 88   Temp 98.4 F (36.9 C) (Oral)   Resp 18   Ht 5\' 2"  (1.575 m)   Wt 74.8 kg (165 lb)   LMP  10/24/2016 (Approximate)   SpO2 100%   BMI 30.18 kg/m  CONSTITUTIONAL: Alert and oriented and responds appropriately to questions. Well-appearing; well-nourished HEAD: Normocephalic EYES: Conjunctivae clear, pupils appear equal, EOMI ENT: normal nose; moist mucous membranes NECK: Supple, no meningismus, no nuchal rigidity, no LAD  CARD: RRR; S1 and S2 appreciated; no murmurs, no clicks, no rubs, no gallops RESP: Normal chest excursion without splinting or tachypnea; breath sounds clear and equal bilaterally; no wheezes, no rhonchi, no rales, no hypoxia or respiratory distress, speaking full sentences ABD/GI: Normal bowel sounds; non-distended; soft, non-tender, no rebound, no guarding, no peritoneal signs, no hepatosplenomegaly BACK:  The back appears normal and is non-tender to palpation, there is no CVA tenderness EXT: Normal ROM in all joints; non-tender to palpation; no edema; normal capillary refill; no cyanosis, no calf tenderness or swelling    SKIN: Normal color for age and race; warm; no rash NEURO: Moves all extremities equally PSYCH: The patient's mood and manner are appropriate. Grooming and personal hygiene are appropriate.  MEDICAL DECISION MAKING: Patient here with chest pain. She has ST depression and T-wave inversions in her anterior leads that are new compared to previous EKG in 2013. She is not sure if these were the same EKG changes that she had 6-7 years ago when she had a stress test. States that stress test was normal. She does not remember where that stress test was done. Will obtain cardiac labs here. I'm concerned this could be her anginal equivalent. We'll give nitroglycerin and aspirin. Will obtain cardiac labs.  ED PROGRESS: Patient chest pain-free after 3 nitroglycerin tablets. LFTs elevated but she reports this is baseline and neck she better than most of her recent labs. She does have a mildly elevated lipase. She does report this is something that is new for her  but she has no abdominal pain on exam, vomiting. Troponin is negative. Chest x-ray is clear. Have recommended admission for chest pain rule out especially given EKG abnormalities. She would like to be admitted to [facility]. Given she is pain-free with a negative troponin 4 hours after the onset of symptoms I feel this is reasonable.  No change in repeat EKG after chest pain-free.   2:39 AM Discussed patient's case with hospitalist, Dr. Hal Hope.  I have recommended admission and patient (and family if present) agree with this plan. Admitting physician will place admission orders.   I reviewed all nursing notes, vitals, pertinent previous records, EKGs, lab and urine results, imaging (as available).    Patient has had a positive d-dimer. CT scan performed to rule out PE. I have reviewed patient's imaging and do not see a pulmonary embolus. Patient stable, chest pain-free and will be transferred to [facility] long  for admission.    EKG Interpretation  Date/Time:  Friday November 07 2016 00:47:45 EDT Ventricular Rate:  86 PR Interval:  128 QRS Duration: 98 QT Interval:  388 QTC Calculation: 464 R Axis:   52 Text Interpretation:  Normal sinus rhythm Prolonged QT Abnormal ECG Confirmed by Pryor Curia [phone]) on 11/07/2016 12:56:30 AM        EKG Interpretation  Date/Time:  Friday November 07 2016 02:11:17 EDT Ventricular Rate:  79 PR Interval:  128 QRS Duration: 98 QT Interval:  400 QTC Calculation: 462 R Axis:   37 Text Interpretation:  sinus rhythm Abnormal R-wave progression, early transition Minimal ST depression, diffuse leads Confirmed by Pryor Curia [id number]) on 11/07/2016 5:18:14 AM          Ward, Delice Bison, DO 11/07/16 [place], Delice Bison, DO 11/07/16 1779

## 2016-11-07 NOTE — Progress Notes (Signed)
CRITICAL VALUE ALERT  Critical Value:  Troponin 0.03  Date & Time Notied: 11/07/2016 0818  Provider Notified: Dr. Maylene Roes   Orders Received/Actions taken:

## 2016-11-07 NOTE — Progress Notes (Signed)
   [name] presented for a nuclear stress test today.  No immediate complications.  Stress imaging is pending at this time.  Preliminary EKG findings may be listed in the chart, but the stress test result will not be finalized until perfusion imaging is complete.  Tami Lin Finas Delone, PA-C 11/07/2016, 12:04 PM

## 2016-11-07 NOTE — Discharge Summary (Signed)
Physician Discharge Summary  Christine Butler YIR:[id number] DOB: 1970-06-04 DOA: 11/07/2016  PCP: [name], MD  Admit date: 11/07/2016 Discharge date: 11/07/2016  Admitted From: Home Disposition:  Home  Recommendations for Outpatient Follow-up:  1. Follow up with PCP in 1 week  Discharge Condition: Stable CODE STATUS: Full  Diet recommendation: Heart healthy   Brief/Interim Summary: Christine Butler is a 46 yo female with history of primary biliary cirrhosis who presented to the ER with complaints of chest pain. Last evening around 9 PM patient started developing chest pain which was retrosternal sharp shooting. Eventually patient's chest pain started radiating to both arms. Patient tried PPI followed by ibuprofen despite which patient's chest pain did not improve. In the ED, chest pain was relieved by 3 sublingual nitro. EKG showed T wave inversions anteriorly. CTA chest was unremarkable. Cardiology was consulted. She underwent nuclear stress test which was low risk. Echocardiogram showed normal and preserved ejection fraction. Patient was discharged home in stable condition; recommended further GI follow-up for esophageal etiology of chest pain.  Discharge Diagnoses:  Principal Problem:   Chest pain Active Problems:   GERD (gastroesophageal reflux disease)   Primary biliary cirrhosis (HCC)   Abnormal EKG  Discharge Instructions  Discharge Instructions    Call MD for:    Complete by:  As directed    Recurrent chest pain   Call MD for:  difficulty breathing, headache or visual disturbances    Complete by:  As directed    Call MD for:  extreme fatigue    Complete by:  As directed    Call MD for:  hives    Complete by:  As directed    Call MD for:  persistant dizziness or light-headedness    Complete by:  As directed    Call MD for:  persistant nausea and vomiting    Complete by:  As directed    Call MD for:  severe uncontrolled pain    Complete by:  As directed    Call MD  for:  temperature >100.4    Complete by:  As directed    Diet - low sodium heart healthy    Complete by:  As directed    Increase activity slowly    Complete by:  As directed      Allergies as of 11/07/2016      Reactions   Codeine Nausea And Vomiting   Erythromycin Nausea And Vomiting      Medication List    TAKE these medications   CALCIUM + D PO Take 1 tablet by mouth daily.   cholecalciferol 1000 units tablet Commonly known as:  VITAMIN D Take 2,000 Units by mouth daily.   cyclobenzaprine 5 MG tablet Commonly known as:  FLEXERIL Take 1 tablet (5 mg total) by mouth 3 (three) times daily as needed for muscle spasms.   cycloSPORINE 0.05 % ophthalmic emulsion Commonly known as:  RESTASIS Place 1 drop into both eyes every other day.   ibuprofen 200 MG tablet Commonly known as:  ADVIL,MOTRIN Take 600 mg by mouth every 6 (six) hours as needed for headache or mild pain.   lidocaine 5 % Commonly known as:  LIDODERM Place 1 patch onto the skin daily. Remove & Discard patch within 12 hours or as directed by MD   LYSINE PO Take 1 tablet by mouth daily.   MELATONIN PO Take 1 tablet by mouth at bedtime.   multivitamin with minerals Tabs tablet Take 1 tablet by mouth  daily.   nitrofurantoin (macrocrystal-monohydrate) 100 MG capsule Commonly known as:  MACROBID Take 100 mg by mouth daily.   traMADol 50 MG tablet Commonly known as:  ULTRAM Take 1 tablet (50 mg total) by mouth every 8 (eight) hours as needed.   ursodiol 300 MG capsule Commonly known as:  ACTIGALL Take 600-900 mg by mouth 2 (two) times daily. 900mg  in am, 600mg  in pm   vitamin B-12 1000 MCG tablet Commonly known as:  CYANOCOBALAMIN Take 1,000 mcg by mouth daily.   XULANE 150-35 MCG/24HR transdermal patch Generic drug:  norelgestromin-ethinyl estradiol Place 1 patch onto the skin every 7 (seven) days. On sundays            Discharge Care Instructions        Start     Ordered   11/07/16  0000  Increase activity slowly     11/07/16 1756   11/07/16 0000  Diet - low sodium heart healthy     11/07/16 1756   11/07/16 0000  Call MD for:  temperature >100.4     11/07/16 1756   11/07/16 0000  Call MD for:  persistant nausea and vomiting     11/07/16 1756   11/07/16 0000  Call MD for:  severe uncontrolled pain     11/07/16 1756   11/07/16 0000  Call MD for:  difficulty breathing, headache or visual disturbances     11/07/16 1756   11/07/16 0000  Call MD for:    Comments:  Recurrent chest pain   11/07/16 1756   11/07/16 0000  Call MD for:  hives     11/07/16 1756   11/07/16 0000  Call MD for:  persistant dizziness or light-headedness     11/07/16 1756   11/07/16 0000  Call MD for:  extreme fatigue     09 /14/18 1756     Follow-up Information    [name], MD. Call.   Specialty:  Gastroenterology Contact information: [address] [place] [address] [phone]          Allergies  Allergen Reactions  . Codeine Nausea And Vomiting  . Erythromycin Nausea And Vomiting    Consultations:  Cardiology   Procedures/Studies: Dg Chest 2 View  Result Date: 11/07/2016 CLINICAL DATA:  Chest pain for 5 hours. EXAM: CHEST  2 VIEW COMPARISON:  Single-view of the chest 09/07/2015. FINDINGS: The lungs are clear. Heart size is normal. No pneumothorax or pleural fluid. No bony abnormality. IMPRESSION: No acute disease. Electronically Signed   By: Inge Rise M.D.   On: 11/07/2016 01:57   Ct Angio Chest Pe W And/or Wo Contrast  Result Date: 11/07/2016 CLINICAL DATA:  Substernal chest pain for 7 hours. EXAM: CT ANGIOGRAPHY CHEST WITH CONTRAST TECHNIQUE: Multidetector CT imaging of the chest was performed using the standard protocol during bolus administration of intravenous contrast. Multiplanar CT image reconstructions and MIPs were obtained to evaluate the vascular anatomy. CONTRAST:  100 cc Isovue 370 IV COMPARISON:  Chest radiograph earlier this day.  FINDINGS: Cardiovascular: There are no filling defects within the pulmonary arteries to suggest pulmonary embolus. Thoracic aorta is normal in caliber without dissection. Heart is normal in size. No pericardial effusion. Mediastinum/Nodes: No mediastinal, hilar, or axillary adenopathy. The esophagus is decompressed. No thyroid nodule. Trachea and mainstem bronchi are patent. Lungs/Pleura: Clear lungs. No consolidation. No pulmonary edema or pleural fluid. No pulmonary mass. Calcified granuloma in the left lower lobe. Upper Abdomen: No acute abnormality. Musculoskeletal: There  are no acute or suspicious osseous abnormalities. Review of the MIP images confirms the above findings. IMPRESSION: No pulmonary embolus or acute intrathoracic abnormality. Electronically Signed   By: Jeb Levering M.D.   On: 11/07/2016 05:34   Nm Myocar Multi W/spect W/wall Motion / Ef  Result Date: 11/07/2016 CLINICAL DATA:  Chest pain. Ethanol abuse. Gastroesophageal reflux disease. EXAM: MYOCARDIAL IMAGING WITH SPECT (REST AND PHARMACOLOGIC-STRESS) GATED LEFT VENTRICULAR WALL MOTION STUDY LEFT VENTRICULAR EJECTION FRACTION TECHNIQUE: Standard myocardial SPECT imaging was performed after resting intravenous injection of 10 mCi Tc-67m tetrofosmin. Subsequently, intravenous infusion of Lexiscan was performed under the supervision of the Cardiology staff. At peak effect of the drug, 30 mCi Tc-45m tetrofosmin was injected intravenously and standard myocardial SPECT imaging was performed. Quantitative gated imaging was also performed to evaluate left ventricular wall motion, and estimate left ventricular ejection fraction. COMPARISON:  CT of 11/07/2016. FINDINGS: Perfusion: Equivocal mild rest small defect involving the apical segment of the anterolateral wall. No areas of reversibility to suggest inducible ischemia. Wall Motion: Possible mild inferoseptal hypokinesis. Left Ventricular Ejection Fraction: 56 % End diastolic volume 90 ml End  systolic volume 50 ml IMPRESSION: 1. No reversible ischemia or infarction. 2. Possible mild inferoseptal hypokinesis. 3. Left ventricular ejection fraction 56% 4. Non invasive risk stratification*: Low *2012 Appropriate Use Criteria for Coronary Revascularization Focused Update: J Am Coll Cardiol. 9163;84(6):659-935. [email or web address] Electronically Signed   By: Abigail Miyamoto M.D.   On: 11/07/2016 17:40     Echo Study Conclusions  - Left ventricle: The cavity size was normal. Systolic function was   normal. The estimated ejection fraction was in the range of 55%   to 60%. Wall motion was normal; there were no regional wall   motion abnormalities. Left ventricular diastolic function   parameters were normal. - Aortic valve: Transvalvular velocity was within the normal range.   There was no stenosis. There was no regurgitation. - Mitral valve: Transvalvular velocity was within the normal range.   There was no evidence for stenosis. There was no regurgitation. - Left atrium: The atrium was severely dilated. - Right ventricle: The cavity size was normal. Wall thickness was   normal. Systolic function was normal. - Tricuspid valve: There was trivial regurgitation.     Discharge Exam: Vitals:   11/07/16 0500 11/07/16 1500  BP: (!) 156/80 122/83  Pulse: 84 74  Resp: 20 18  Temp: 98.2 F (36.8 C) 98 F (36.7 C)  SpO2: 99% 99%   Vitals:   11/07/16 0330 11/07/16 0400 11/07/16 0500 11/07/16 1500  BP: 120/79  (!) 156/80 122/83  Pulse: 81  84 74  Resp: 20  20 18   Temp:   98.2 F (36.8 C) 98 F (36.7 C)  TempSrc:   Oral Oral  SpO2: 98%  99% 99%  Weight:  76.8 kg (169 lb 5 oz)    Height:  5\' 2"  (1.575 m)      General: Pt is alert, awake, not in acute distress Cardiovascular: RRR, S1/S2 +, no rubs, no gallops Respiratory: CTA bilaterally, no wheezing, no rhonchi Abdominal: Soft, NT, ND, bowel sounds + Extremities: no edema, no  cyanosis    The results of significant diagnostics from this hospitalization (including imaging, microbiology, ancillary and laboratory) are listed below for reference.     Microbiology: No results found for this or any previous visit (from the past 240 hour(s)).   Labs: BNP (last 3 results) No results for input(s): BNP in the last 8760 hours. Basic  Metabolic Panel:  Recent Labs Lab 11/07/16 0105 11/07/16 0647  NA 132*  --   K 3.5  --   CL 100*  --   CO2 24  --   GLUCOSE 109*  --   BUN 9  --   CREATININE 0.55 0.40*  CALCIUM 8.5*  --    Liver Function Tests:  Recent Labs Lab 11/07/16 0105  AST 95*  ALT 102*  ALKPHOS 161*  BILITOT 2.9*  PROT 7.4  ALBUMIN 3.4*    Recent Labs Lab 11/07/16 0105  LIPASE 70*   No results for input(s): AMMONIA in the last 168 hours. CBC:  Recent Labs Lab 11/07/16 0105 11/07/16 0647  WBC 6.7 5.0  NEUTROABS 3.9  --   HGB 12.2 11.2*  HCT 34.7* 32.2*  MCV 92.8 91.7  PLT 169 147*   Cardiac Enzymes:  Recent Labs Lab 11/07/16 0105 11/07/16 0647 11/07/16 1456  TROPONINI <0.03 0.03* <0.03   BNP: Invalid input(s): POCBNP CBG: No results for input(s): GLUCAP in the last 168 hours. D-Dimer  Recent Labs  11/07/16 0105  DDIMER 1.03*   Hgb A1c No results for input(s): HGBA1C in the last 72 hours. Lipid Profile No results for input(s): CHOL, HDL, LDLCALC, TRIG, CHOLHDL, LDLDIRECT in the last 72 hours. Thyroid function studies No results for input(s): TSH, T4TOTAL, T3FREE, THYROIDAB in the last 72 hours.  Invalid input(s): FREET3 Anemia work up No results for input(s): VITAMINB12, FOLATE, FERRITIN, TIBC, IRON, RETICCTPCT in the last 72 hours. Urinalysis    Component Value Date/Time   COLORURINE YELLOW 06/10/2008 1855   APPEARANCEUR CLEAR 06/10/2008 1855   LABSPEC 1.013 06/10/2008 1855   PHURINE 6.0 06/10/2008 1855   GLUCOSEU NEGATIVE 06/10/2008 1855   HGBUR NEGATIVE 06/10/2008 1855   BILIRUBINUR small  08/04/2013 0836   KETONESUR negative 08/04/2013 0836   KETONESUR NEGATIVE 06/10/2008 1855   PROTEINUR =30 08/04/2013 0836   PROTEINUR NEGATIVE 06/10/2008 1855   UROBILINOGEN 1.0 08/04/2013 0836   UROBILINOGEN 1.0 06/10/2008 1855   NITRITE Positive 08/04/2013 0836   NITRITE NEGATIVE 06/10/2008 1855   LEUKOCYTESUR small (1+) 08/04/2013 0836   Sepsis Labs Invalid input(s): PROCALCITONIN,  WBC,  LACTICIDVEN Microbiology No results found for this or any previous visit (from the past 240 hour(s)).   Time coordinating discharge: 40 minutes  SIGNED:  Dessa Phi, DO [facility] Pager [phone]  If 7PM-7AM, please contact night-coverage [email or web address] Password [facility] 11/07/2016, 5:57 PM

## 2016-11-07 NOTE — ED Notes (Signed)
No changes, alert, NAD, calm. Pt in CT.

## 2016-11-07 NOTE — Progress Notes (Signed)
  PROGRESS NOTE  Patient admitted earlier this morning. See H&P. Christine Butler is a 46 yo female with history of primary biliary cirrhosis who presented to the ER with complaints of chest pain. Last evening around 9 PM patient started developing chest pain which was retrosternal sharp shooting. Eventually patient's chest pain started radiating to both arms. Patient tried PPI followed by ibuprofen despite which patient's chest pain did not improve. In the ED, chest pain was relieved by 3 sublingual nitro. EKG showed T wave inversions anteriorly. CTA chest was unremarkable. Cardiology was consulted. Nuc stress test, echocardiogram pending   Dessa Phi, DO [facility] [email or web address] Password [email or web address] 11/07/2016, 1:39 PM

## 2016-11-07 NOTE — ED Notes (Signed)
Alert, NAD, calm, up to b/r, steady gait, family at [facility]

## 2016-11-13 DIAGNOSIS — R87612 Low grade squamous intraepithelial lesion on cytologic smear of cervix (LGSIL): Secondary | ICD-10-CM | POA: Diagnosis not present

## 2016-11-13 DIAGNOSIS — N879 Dysplasia of cervix uteri, unspecified: Secondary | ICD-10-CM | POA: Diagnosis not present

## 2016-11-14 MED FILL — URSODIOL 300 MG CAPSULE: 300 | 90 days supply | Qty: 450 | Fill #0

## 2016-11-18 LAB — NM MYOCAR MULTI W/SPECT W/WALL MOTION / EF
CHL CUP MPHR: 174 {beats}/min
CHL CUP RESTING HR STRESS: 78 {beats}/min
Estimated workload: 10.1 METS
Exercise duration (min): 9 min
Exercise duration (sec): 0 s
Peak HR: 151 {beats}/min
Percent HR: 86 %

## 2016-11-20 MED FILL — XULANE PATCH: 150-35 | 84 days supply | Qty: 9 | Fill #0

## 2016-11-27 DIAGNOSIS — H5213 Myopia, bilateral: Secondary | ICD-10-CM | POA: Diagnosis not present

## 2016-11-27 MED FILL — RESTASIS MULTIDOSE 0.05% EY: 0.05 | 30 days supply | Qty: 6 | Fill #0

## 2016-11-27 MED FILL — DOXYCYCLINE HYC 50 MG CAP: 50 | 14 days supply | Qty: 28 | Fill #0

## 2016-11-28 ENCOUNTER — Encounter: Payer: Self-pay | Admitting: *Deleted

## 2016-12-09 DIAGNOSIS — K743 Primary biliary cirrhosis: Secondary | ICD-10-CM | POA: Diagnosis not present

## 2016-12-09 DIAGNOSIS — R0789 Other chest pain: Secondary | ICD-10-CM | POA: Diagnosis not present

## 2017-01-12 DIAGNOSIS — K743 Primary biliary cirrhosis: Secondary | ICD-10-CM | POA: Diagnosis not present

## 2017-01-12 DIAGNOSIS — R0789 Other chest pain: Secondary | ICD-10-CM | POA: Diagnosis not present

## 2017-01-16 MED FILL — DOXYCYCLINE HYC 50 MG CAP: 50 | 14 days supply | Qty: 28 | Fill #1

## 2017-01-16 MED FILL — RESTASIS MULTIDOSE 0.05% EY: 0.05 | 30 days supply | Qty: 6 | Fill #1

## 2017-01-21 DIAGNOSIS — R8271 Bacteriuria: Secondary | ICD-10-CM | POA: Diagnosis not present

## 2017-01-21 DIAGNOSIS — K2930 Chronic superficial gastritis without bleeding: Secondary | ICD-10-CM | POA: Diagnosis not present

## 2017-01-21 DIAGNOSIS — R079 Chest pain, unspecified: Secondary | ICD-10-CM | POA: Diagnosis not present

## 2017-01-21 DIAGNOSIS — R300 Dysuria: Secondary | ICD-10-CM | POA: Diagnosis not present

## 2017-01-21 MED FILL — URO-MP CAPSULE: 118 | 10 days supply | Qty: 30 | Fill #0

## 2017-02-03 DIAGNOSIS — N926 Irregular menstruation, unspecified: Secondary | ICD-10-CM | POA: Diagnosis not present

## 2017-02-03 DIAGNOSIS — N924 Excessive bleeding in the premenopausal period: Secondary | ICD-10-CM | POA: Diagnosis not present

## 2017-02-10 MED FILL — TRIMETHOPRIM 100 MG TABLET: 100 | 90 days supply | Qty: 45 | Fill #0

## 2017-03-16 ENCOUNTER — Ambulatory Visit: Payer: 59 | Admitting: Cardiology

## 2017-03-16 ENCOUNTER — Encounter: Payer: Self-pay | Admitting: Cardiology

## 2017-03-16 ENCOUNTER — Ambulatory Visit (HOSPITAL_BASED_OUTPATIENT_CLINIC_OR_DEPARTMENT_OTHER)
Admission: RE | Admit: 2017-03-16 | Discharge: 2017-03-16 | Disposition: A | Discharge status: Home / Self Care | Payer: 59 | Source: Ambulatory Visit | Attending: Cardiology | Admitting: Cardiology

## 2017-03-16 ENCOUNTER — Other Ambulatory Visit: Payer: Self-pay

## 2017-03-16 DIAGNOSIS — K7460 Unspecified cirrhosis of liver: Secondary | ICD-10-CM

## 2017-03-16 DIAGNOSIS — K745 Biliary cirrhosis, unspecified: Secondary | ICD-10-CM | POA: Insufficient documentation

## 2017-03-16 DIAGNOSIS — I209 Angina pectoris, unspecified: Secondary | ICD-10-CM | POA: Insufficient documentation

## 2017-03-16 DIAGNOSIS — R079 Chest pain, unspecified: Secondary | ICD-10-CM | POA: Diagnosis not present

## 2017-03-16 DIAGNOSIS — K743 Primary biliary cirrhosis: Secondary | ICD-10-CM | POA: Diagnosis not present

## 2017-03-16 HISTORY — DX: Unspecified cirrhosis of liver: K74.60

## 2017-03-16 MED ORDER — ASPIRIN EC 81 MG PO TBEC: 162.0000 mg | DELAYED_RELEASE_TABLET | Freq: Every day | ORAL | 3 refills | Status: DC

## 2017-03-16 MED ORDER — NITROGLYCERIN 0.4 MG SL SUBL: 0.4000 mg | SUBLINGUAL_TABLET | SUBLINGUAL | 6 refills | Status: DC | PRN

## 2017-03-16 MED FILL — NITROGLYCERIN 0.4 MG TAB SL: 0.4 | 30 days supply | Qty: 25 | Fill #0

## 2017-03-16 NOTE — Progress Notes (Signed)
Cardiology Office Note:    Date:  03/16/2017   ID:  Christine Butler, DOB 09-11-1970, MRN [id number]  PCP:  [name], MD  Cardiologist:  [name], MD   Referring MD: [name], MD    ASSESSMENT:    1. Angina pectoris ([place])   2. Primary biliary cholangitis ([facility])    PLAN:    In order of problems listed above:  1. The patient's symptoms are very concerning and very suggestive of angina.  She does not have too many risk factors for coronary artery disease that I know of.  I do not know her lipid status.  In view of these very concerning factors I told her not to stress herself in the next few days.  Sublingual nitroglycerin prescription was sent, its protocol and 911 protocol explained and the patient vocalized understanding questions were answered to the patient's satisfaction she was also advised to take 2 coated baby aspirins on a daily basis. 2. I discussed coronary angiography and left heart catheterization with the patient at extensive length. Procedure, benefits and potential risks were explained. Patient had multiple questions which were answered to the patient's satisfaction. Patient agreed and consented for the procedure. Further recommendations will be made based on the findings of the coronary angiography. In the interim. The patient has any significant symptoms he knows to go to the nearest emergency room. 3.    Medication Adjustments/Labs and Tests Ordered: Current medicines are reviewed at length with the patient today.  Concerns regarding medicines are outlined above.  No orders of the defined types were placed in this encounter.  No orders of the defined types were placed in this encounter.    History of Present Illness:    Christine Butler is a 47 y.o. female who is being seen today for the evaluation of chest tightness on exertion at the request of [name], MD.  Patient is a pleasant 47 year old female.  She has past medical history of primary biliary  cholangitis.  She has had a history of chest tightness and she mentions to me that in the past she has been evaluated and had a stress test which was unremarkable.  Patient is a very intelligent and a very good historian.  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  She mentions to me that she has substernal chest tightness going to the neck on exertion.  This occurs with any kind of exertion physical, mental and sexual.  This has even made her stop whatever she has doing and it occurs consistently.  No orthopnea or PND.  No syncope.  At the time of my evaluation, the patient is alert awake oriented and in no distress.  Past Medical History:  Diagnosis Date  . Arthritis    right knee  . GERD (gastroesophageal reflux disease)   . IBS (irritable bowel syndrome)   . PONV (postoperative nausea and vomiting)   . Primary biliary cholangitis ([place])   . Primary biliary cirrhosis ([place])   . SVD (spontaneous vaginal delivery)    x 3  . Urinary tract bacterial infections     Past Surgical History:  Procedure Laterality Date  . INCONTINENCE SURGERY    . LEEP N/A 03/28/2014   Procedure: LOOP ELECTROSURGICAL EXCISION PROCEDURE (LEEP) cone biopsy;  Surgeon: Cyril Mourning, MD;  Location: [facility];  Service: Gynecology;  Laterality: N/A;  . LIVER BIOPSY     x 2  . WISDOM TOOTH EXTRACTION  Current Medications: Current Meds  Medication Sig  . Calcium Carbonate-Vitamin D (CALCIUM + D PO) Take 1 tablet by mouth daily.  . cholecalciferol (VITAMIN D) 1000 units tablet Take 2,000 Units by mouth daily.  . diphenhydrAMINE (BENADRYL) 50 MG capsule Take 1 capsule by mouth as needed.  Marland Kitchen ibuprofen (ADVIL,MOTRIN) 200 MG tablet Take 600 mg by mouth every 6 (six) hours as needed for headache or mild pain.  Marland Kitchen lidocaine (LIDODERM) 5 % Place 1 patch onto the skin daily. Remove & Discard patch within 12 hours or as directed by MD (Patient taking differently: Place 1 patch onto the skin  daily as needed. Remove & Discard patch within 12 hours or as directed by MD)  . LYSINE PO Take 1 tablet by mouth daily.  Marland Kitchen MELATONIN PO Take 1 tablet by mouth at bedtime.  . Multiple Vitamin (MULTIVITAMIN WITH MINERALS) TABS Take 1 tablet by mouth daily.  Marland Kitchen trimethoprim (TRIMPEX) 100 MG tablet Take 1 tablet by mouth every other day.  . ursodiol (ACTIGALL) 300 MG capsule Take 600-900 mg by mouth 2 (two) times daily. 900mg  in am, 600mg  in pm  . vitamin B-12 (CYANOCOBALAMIN) 1000 MCG tablet Take 1,000 mcg by mouth daily.     Allergies:   Codeine and Erythromycin   Social History   Socioeconomic History  . Marital status: Divorced    Spouse name: None  . Number of children: None  . Years of education: None  . Highest education level: None  Social Needs  . Financial resource strain: None  . Food insecurity - worry: None  . Food insecurity - inability: None  . Transportation needs - medical: None  . Transportation needs - non-medical: None  Occupational History  . None  Tobacco Use  . Smoking status: Never Smoker  . Smokeless tobacco: Never Used  Substance and Sexual Activity  . Alcohol use: Yes    Alcohol/week: 0.6 oz    Types: 1 Glasses of wine per week    Comment: for dinner  . Drug use: No  . Sexual activity: None  Other Topics Concern  . None  Social History Narrative  . None     Family History: The patient's family history includes CAD in her father; Diabetes Mellitus II in her father.  ROS:   Please see the history of present illness.    All other systems reviewed and are negative.  EKGs/Labs/Other Studies Reviewed:    The following studies were reviewed today: EKG done today reveals sinus rhythm and nonspecific ST-T changes.   Recent Labs: 11/07/2016: ALT 102; BUN 9; Creatinine, Ser 0.40; Hemoglobin 11.2; Platelets 147; Potassium 3.5; Sodium 132  Recent Lipid Panel No results found for: CHOL, TRIG, HDL, CHOLHDL, VLDL, LDLCALC, LDLDIRECT  Physical Exam:      VS:  BP 106/74 (BP Location: Left Arm, Patient Position: Sitting, Cuff Size: Large)   Pulse 84   Ht 5\' 2"  (1.575 m)   Wt 163 lb 1.9 oz (74 kg)   SpO2 99%   BMI 29.84 kg/m     Wt Readings from Last 3 Encounters:  03/16/17 163 lb 1.9 oz (74 kg)  11/07/16 169 lb 5 oz (76.8 kg)  09/07/15 161 lb 6.4 oz (73.2 kg)     GEN: Patient is in no acute distress HEENT: Normal NECK: No JVD; No carotid bruits LYMPHATICS: No lymphadenopathy CARDIAC: S1 S2 regular, 2/6 systolic murmur at the apex. RESPIRATORY:  Clear to auscultation without rales, wheezing or rhonchi  ABDOMEN: Soft,  non-tender, non-distended MUSCULOSKELETAL:  No edema; No deformity  SKIN: Warm and dry NEUROLOGIC:  Alert and oriented x 3 PSYCHIATRIC:  Normal affect    Signed, [name], MD  03/16/2017 9:50 AM    [place]

## 2017-03-16 NOTE — Patient Instructions (Addendum)
Medication Instructions:  Your physician has recommended you make the following change in your medication:  START 162 mg (two baby asprin) enteric coated daily START Nitroglycerin 0.4 mg sublingual (under your tongue) as needed for chest pain. If experiencing chest pain, stop what you are doing and sit down. Take 1 nitroglycerin and wait 5 minutes. If chest pain continues, take another nitroglycerin and wait 5 minutes. If chest pain does not subside, take 1 more nitroglycerin and dial 911. You make take a total of 3 nitroglycerin in a 15 minute time frame.  Labwork: Your physician recommends that you have the following labs drawn: CBC, BMP, and INR  Testing/Procedures: A chest x-ray takes a picture of the organs and structures inside the chest, including the heart, lungs, and blood vessels. This test can show several things, including, whether the heart is enlarges; whether fluid is building up in the lungs; and whether pacemaker / defibrillator leads are still in place.    [facility] [address] [place] [address] Dept: [phone] Loc: [facility]  03/16/2017  You are scheduled for a Cardiac Catheterization on Monday, January 28 with Dr. Larae Grooms.  1. Please arrive at the [facility] (Main Entrance A) at [facility] [place]: [address] [place], [place] [address] at 5:30 AM (two hours before your procedure to ensure your preparation). Free valet parking service is available.   Special note: Every effort is made to have your procedure done on time. Please understand that emergencies sometimes delay scheduled procedures.  2. Diet: Do not eat or drink anything after midnight prior to your procedure except sips of water to take medications.  3. Labs: Done 03/16/2017  4. Medication instructions in preparation for your procedure:   On the morning of your  procedure, take your Aspirin and any morning medicines NOT listed above.  You may use sips of water.  5. Plan for one night stay--bring personal belongings. 6. Bring a current list of your medications and current insurance cards. 7. You MUST have a responsible person to drive you home. 8. Someone MUST be with you the first 24 hours after you arrive home or your discharge will be delayed. 9. Please wear clothes that are easy to get on and off and wear slip-on shoes.  Thank you for allowing [place] to care for you!   -- [facility]   Follow-Up: Your physician recommends that you schedule a follow-up appointment in: 2 weeks  Any Other Special Instructions Will Be Listed Below (If Applicable).     If you need a refill on your cardiac medications before your next appointment, please call your pharmacy.   [place], RN, BSN   Coronary Angiogram With Stent Coronary angiogram with stent placement is a procedure to widen or open a narrow blood vessel of the heart (coronary artery). Arteries may become blocked by cholesterol buildup (plaques) in the lining of the wall. When a coronary artery becomes partially blocked, blood flow to that area decreases. This may lead to chest pain or a heart attack (myocardial infarction). A stent is a small piece of metal that looks like mesh or a spring. Stent placement may be done as treatment for a heart attack or right after a coronary angiogram in which a blocked artery is found. Let your health care provider know about:  Any allergies you have.  All medicines you are taking, including vitamins, herbs, eye drops, creams, and over-the-counter medicines.  Any problems you or family members have had with anesthetic medicines.  Any blood disorders you have.  Any surgeries you have had.  Any medical conditions you have.  Whether you are pregnant or may be pregnant. What are the risks? Generally, this is  a safe procedure. However, problems may occur, including:  Damage to the heart or its blood vessels.  A return of blockage.  Bleeding, infection, or bruising at the insertion site.  A collection of blood under the skin (hematoma) at the insertion site.  A blood clot in another part of the body.  Kidney injury.  Allergic reaction to the dye or contrast that is used.  Bleeding into the abdomen (retroperitoneal bleeding).  What happens before the procedure? Staying hydrated Follow instructions from your health care provider about hydration, which may include:  Up to 2 hours before the procedure - you may continue to drink clear liquids, such as water, clear fruit juice, black coffee, and plain tea.  Eating and drinking restrictions Follow instructions from your health care provider about eating and drinking, which may include:  8 hours before the procedure - stop eating heavy meals or foods such as meat, fried foods, or fatty foods.  6 hours before the procedure - stop eating light meals or foods, such as toast or cereal.  2 hours before the procedure - stop drinking clear liquids.  Ask your health care provider about:  Changing or stopping your regular medicines. This is especially important if you are taking diabetes medicines or blood thinners.  Taking medicines such as ibuprofen. These medicines can thin your blood. Do not take these medicines before your procedure if your health care provider instructs you not to. Generally, aspirin is recommended before a procedure of passing a small, thin tube (catheter) through a blood vessel and into the heart (cardiac catheterization).  What happens during the procedure?  An IV tube will be inserted into one of your veins.  You will be given one or more of the following: ? A medicine to help you relax (sedative). ? A medicine to numb the area where the catheter will be inserted into an artery (local anesthetic).  To reduce your  risk of infection: ? Your health care team will wash or sanitize their hands. ? Your skin will be washed with soap. ? Hair may be removed from the area where the catheter will be inserted.  Using a guide wire, the catheter will be inserted into an artery. The location may be in your groin, in your wrist, or in the fold of your arm (near your elbow).  A type of X-ray (fluoroscopy) will be used to help guide the catheter to the opening of the arteries in the heart.  A dye will be injected into the catheter, and X-rays will be taken. The dye will help to show where any narrowing or blockages are located in the arteries.  A tiny wire will be guided to the blocked spot, and a balloon will be inflated to make the artery wider.  The stent will be expanded and will crush the plaques into the wall of the vessel. The stent will hold the area open and improve the blood flow. Most stents have a drug coating to reduce the risk of the stent narrowing over time.  The artery may be made wider using a drill, laser, or other tools to remove plaques.  When  the blood flow is better, the catheter will be removed. The lining of the artery will grow over the stent, which stays where it was placed. This procedure may vary among health care providers and hospitals. What happens after the procedure?  If the procedure is done through the leg, you will be kept in bed lying flat for about 6 hours. You will be instructed to not bend and not cross your legs.  The insertion site will be checked frequently.  The pulse in your foot or wrist will be checked frequently.  You may have additional blood tests, X-rays, and a test that records the electrical activity of your heart (electrocardiogram, or ECG). This information is not intended to replace advice given to you by your health care provider. Make sure you discuss any questions you have with your health care provider. Document Released: 08/17/2002 Document Revised:  10/11/2015 Document Reviewed: 09/16/2015 Elsevier Interactive Patient Education  2018 [employer].  Aspirin and Your Heart Aspirin is a medicine that affects the way blood clots. Aspirin can be used to help reduce the risk of blood clots, heart attacks, and other heart-related problems. Should I take aspirin? Your health care provider will help you determine whether it is safe and beneficial for you to take aspirin daily. Taking aspirin daily may be beneficial if you:  Have had a heart attack or chest pain.  Have undergone open heart surgery such as coronary artery bypass surgery (CABG).  Have had coronary angioplasty.  Have experienced a stroke or transient ischemic attack (TIA).  Have peripheral vascular disease (PVD).  Have chronic heart rhythm problems such as atrial fibrillation.  Are there any risks of taking aspirin daily? Daily use of aspirin can increase your risk of side effects. Some of these include:  Bleeding. Bleeding problems can be minor or serious. An example of a minor problem is a cut that does not stop bleeding. An example of a more serious problem is stomach bleeding or bleeding into the brain. Your risk of bleeding is increased if you are also taking non-steroidal anti-inflammatory medicine (NSAIDs).  Increased bruising.  Upset stomach.  An allergic reaction. People who have nasal polyps have an increased risk of developing an aspirin allergy.  What are some guidelines I should follow when taking aspirin?  Take aspirin only as directed by your health care provider. Make sure you understand how much you should take and what form you should take. The two forms of aspirin are: ? Non-enteric-coated. This type of aspirin does not have a coating and is absorbed quickly. Non-enteric-coated aspirin is usually recommended for people with chest pain. This type of aspirin also comes in a chewable form. ? Enteric-coated. This type of aspirin has a special coating that  releases the medicine very slowly. Enteric-coated aspirin causes less stomach upset than non-enteric-coated aspirin. This type of aspirin should not be chewed or crushed.  Drink alcohol in moderation. Drinking alcohol increases your risk of bleeding. When should I seek medical care?  You have unusual bleeding or bruising.  You have stomach pain.  You have an allergic reaction. Symptoms of an allergic reaction include: ? Hives. ? Itchy skin. ? Swelling of the lips, tongue, or face.  You have ringing in your ears. When should I seek immediate medical care?  Your bowel movements are bloody, dark red, or black in color.  You vomit or cough up blood.  You have blood in your urine.  You cough, wheeze, or feel short of breath.  If you have any of the following symptoms, this is an emergency. Do not wait to see if the pain will go away. Get medical help at once. Call your local emergency services (911 in the [place] Do not drive yourself to the hospital.  You have severe chest pain, especially if the pain is crushing or pressure-like and spreads to the arms, back, neck, or jaw.  You have stroke-like symptoms, such as: ? Loss of vision. ? Difficulty talking. ? Numbness or weakness on one side of your body. ? Numbness or weakness in your arm or leg. ? Not thinking clearly or feeling confused.  This information is not intended to replace advice given to you by your health care provider. Make sure you discuss any questions you have with your health care provider. Document Released: 01/24/2008 Document Revised: 06/20/2015 Document Reviewed: 05/18/2013 Elsevier Interactive Patient Education  2018 [employer]. Nitroglycerin sublingual tablets What is this medicine? NITROGLYCERIN (nye troe GLI ser in) is a type of vasodilator. It relaxes blood vessels, increasing the blood and oxygen supply to your heart. This medicine is used to relieve chest pain caused by angina. It is also used to prevent  chest pain before activities like climbing stairs, going outdoors in cold weather, or sexual activity. This medicine may be used for other purposes; ask your health care provider or pharmacist if you have questions. COMMON BRAND NAME(S): Nitroquick, Nitrostat, Nitrotab What should I tell my health care provider before I take this medicine? They need to know if you have any of these conditions: -anemia -head injury, recent stroke, or bleeding in the brain -liver disease -previous heart attack -an unusual or allergic reaction to nitroglycerin, other medicines, foods, dyes, or preservatives -pregnant or trying to get pregnant -breast-feeding How should I use this medicine? Take this medicine by mouth as needed. At the first sign of an angina attack (chest pain or tightness) place one tablet under your tongue. You can also take this medicine 5 to 10 minutes before an event likely to produce chest pain. Follow the directions on the prescription label. Let the tablet dissolve under the tongue. Do not swallow whole. Replace the dose if you accidentally swallow it. It will help if your mouth is not dry. Saliva around the tablet will help it to dissolve more quickly. Do not eat or drink, smoke or chew tobacco while a tablet is dissolving. If you are not better within 5 minutes after taking ONE dose of nitroglycerin, call 9-1-1 immediately to seek emergency medical care. Do not take more than 3 nitroglycerin tablets over 15 minutes. If you take this medicine often to relieve symptoms of angina, your doctor or health care professional may provide you with different instructions to manage your symptoms. If symptoms do not go away after following these instructions, it is important to call 9-1-1 immediately. Do not take more than 3 nitroglycerin tablets over 15 minutes. Talk to your pediatrician regarding the use of this medicine in children. Special care may be needed. Overdosage: If you think you have taken too  much of this medicine contact a poison control center or emergency room at once. NOTE: This medicine is only for you. Do not share this medicine with others. What if I miss a dose? This does not apply. This medicine is only used as needed. What may interact with this medicine? Do not take this medicine with any of the following medications: -certain migraine medicines like ergotamine and dihydroergotamine (DHE) -medicines used to treat erectile  dysfunction like sildenafil, tadalafil, and vardenafil -riociguat This medicine may also interact with the following medications: -alteplase -aspirin -heparin -medicines for high blood pressure -medicines for mental depression -other medicines used to treat angina -phenothiazines like chlorpromazine, mesoridazine, prochlorperazine, thioridazine This list may not describe all possible interactions. Give your health care provider a list of all the medicines, herbs, non-prescription drugs, or dietary supplements you use. Also tell them if you smoke, drink alcohol, or use illegal drugs. Some items may interact with your medicine. What should I watch for while using this medicine? Tell your doctor or health care professional if you feel your medicine is no longer working. Keep this medicine with you at all times. Sit or lie down when you take your medicine to prevent falling if you feel dizzy or faint after using it. Try to remain calm. This will help you to feel better faster. If you feel dizzy, take several deep breaths and lie down with your feet propped up, or bend forward with your head resting between your knees. You may get drowsy or dizzy. Do not drive, use machinery, or do anything that needs mental alertness until you know how this drug affects you. Do not stand or sit up quickly, especially if you are an older patient. This reduces the risk of dizzy or fainting spells. Alcohol can make you more drowsy and dizzy. Avoid alcoholic drinks. Do not treat  yourself for coughs, colds, or pain while you are taking this medicine without asking your doctor or health care professional for advice. Some ingredients may increase your blood pressure. What side effects may I notice from receiving this medicine? Side effects that you should report to your doctor or health care professional as soon as possible: -blurred vision -dry mouth -skin rash -sweating -the feeling of extreme pressure in the head -unusually weak or tired Side effects that usually do not require medical attention (report to your doctor or health care professional if they continue or are bothersome): -flushing of the face or neck -headache -irregular heartbeat, palpitations -nausea, vomiting This list may not describe all possible side effects. Call your doctor for medical advice about side effects. You may report side effects to FDA at 1-800-FDA-1088. Where should I keep my medicine? Keep out of the reach of children. Store at room temperature between 20 and 25 degrees C (68 and 77 degrees F). Store in [employer]. Protect from light and moisture. Keep tightly closed. Throw away any unused medicine after the expiration date. NOTE: This sheet is a summary. It may not cover all possible information. If you have questions about this medicine, talk to your doctor, pharmacist, or health care provider.  2018 Elsevier/Gold Standard (2012-12-09 17:57:36)

## 2017-03-16 NOTE — H&P (View-Only) (Signed)
Cardiology Office Note:    Date:  03/16/2017   ID:  CARLISA Butler, DOB Sep 05, 1970, MRN [id number]  PCP:  [name], MD  Cardiologist:  [name], MD   Referring MD: [name], MD    ASSESSMENT:    1. Angina pectoris (Christine Butler)   2. Primary biliary cholangitis ([facility])    PLAN:    In order of problems listed above:  1. The patient's symptoms are very concerning and very suggestive of angina.  She does not have too many risk factors for coronary artery disease that I know of.  I do not know her lipid status.  In view of these very concerning factors I told her not to stress herself in the next few days.  Sublingual nitroglycerin prescription was sent, its protocol and 911 protocol explained and the patient vocalized understanding questions were answered to the patient's satisfaction she was also advised to take 2 coated baby aspirins on a daily basis. 2. I discussed coronary angiography and left heart catheterization with the patient at extensive length. Procedure, benefits and potential risks were explained. Patient had multiple questions which were answered to the patient's satisfaction. Patient agreed and consented for the procedure. Further recommendations will be made based on the findings of the coronary angiography. In the interim. The patient has any significant symptoms he knows to go to the nearest emergency room. 3.    Medication Adjustments/Labs and Tests Ordered: Current medicines are reviewed at length with the patient today.  Concerns regarding medicines are outlined above.  No orders of the defined types were placed in this encounter.  No orders of the defined types were placed in this encounter.    History of Present Illness:    Christine Butler is a 47 y.o. female who is being seen today for the evaluation of chest tightness on exertion at the request of [name], MD.  Patient is a pleasant 47 year old female.  She has past medical history of primary biliary  cholangitis.  She has had a history of chest tightness and she mentions to me that in the past she has been evaluated and had a stress test which was unremarkable.  Patient is a very intelligent and a very good historian.  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  She mentions to me that she has substernal chest tightness going to the neck on exertion.  This occurs with any kind of exertion physical, mental and sexual.  This has even made her stop whatever she has doing and it occurs consistently.  No orthopnea or PND.  No syncope.  At the time of my evaluation, the patient is alert awake oriented and in no distress.  Past Medical History:  Diagnosis Date  . Arthritis    right knee  . GERD (gastroesophageal reflux disease)   . IBS (irritable bowel syndrome)   . PONV (postoperative nausea and vomiting)   . Primary biliary cholangitis ([place])   . Primary biliary cirrhosis ([place])   . SVD (spontaneous vaginal delivery)    x 3  . Urinary tract bacterial infections     Past Surgical History:  Procedure Laterality Date  . INCONTINENCE SURGERY    . LEEP N/A 03/28/2014   Procedure: LOOP ELECTROSURGICAL EXCISION PROCEDURE (LEEP) cone biopsy;  Surgeon: Cyril Mourning, MD;  Location: [facility];  Service: Gynecology;  Laterality: N/A;  . LIVER BIOPSY     x 2  . WISDOM TOOTH EXTRACTION  Current Medications: Current Meds  Medication Sig  . Calcium Carbonate-Vitamin D (CALCIUM + D PO) Take 1 tablet by mouth daily.  . cholecalciferol (VITAMIN D) 1000 units tablet Take 2,000 Units by mouth daily.  . diphenhydrAMINE (BENADRYL) 50 MG capsule Take 1 capsule by mouth as needed.  Marland Kitchen ibuprofen (ADVIL,MOTRIN) 200 MG tablet Take 600 mg by mouth every 6 (six) hours as needed for headache or mild pain.  Marland Kitchen lidocaine (LIDODERM) 5 % Place 1 patch onto the skin daily. Remove & Discard patch within 12 hours or as directed by MD (Patient taking differently: Place 1 patch onto the skin  daily as needed. Remove & Discard patch within 12 hours or as directed by MD)  . LYSINE PO Take 1 tablet by mouth daily.  Marland Kitchen MELATONIN PO Take 1 tablet by mouth at bedtime.  . Multiple Vitamin (MULTIVITAMIN WITH MINERALS) TABS Take 1 tablet by mouth daily.  Marland Kitchen trimethoprim (TRIMPEX) 100 MG tablet Take 1 tablet by mouth every other day.  . ursodiol (ACTIGALL) 300 MG capsule Take 600-900 mg by mouth 2 (two) times daily. 900mg  in am, 600mg  in pm  . vitamin B-12 (CYANOCOBALAMIN) 1000 MCG tablet Take 1,000 mcg by mouth daily.     Allergies:   Codeine and Erythromycin   Social History   Socioeconomic History  . Marital status: Divorced    Spouse name: None  . Number of children: None  . Years of education: None  . Highest education level: None  Social Needs  . Financial resource strain: None  . Food insecurity - worry: None  . Food insecurity - inability: None  . Transportation needs - medical: None  . Transportation needs - non-medical: None  Occupational History  . None  Tobacco Use  . Smoking status: Never Smoker  . Smokeless tobacco: Never Used  Substance and Sexual Activity  . Alcohol use: Yes    Alcohol/week: 0.6 oz    Types: 1 Glasses of wine per week    Comment: for dinner  . Drug use: No  . Sexual activity: None  Other Topics Concern  . None  Social History Narrative  . None     Family History: The patient's family history includes CAD in her father; Diabetes Mellitus II in her father.  ROS:   Please see the history of present illness.    All other systems reviewed and are negative.  EKGs/Labs/Other Studies Reviewed:    The following studies were reviewed today: EKG done today reveals sinus rhythm and nonspecific ST-T changes.   Recent Labs: 11/07/2016: ALT 102; BUN 9; Creatinine, Ser 0.40; Hemoglobin 11.2; Platelets 147; Potassium 3.5; Sodium 132  Recent Lipid Panel No results found for: CHOL, TRIG, HDL, CHOLHDL, VLDL, LDLCALC, LDLDIRECT  Physical Exam:      VS:  BP 106/74 (BP Location: Left Arm, Patient Position: Sitting, Cuff Size: Large)   Pulse 84   Ht 5\' 2"  (1.575 m)   Wt 163 lb 1.9 oz (74 kg)   SpO2 99%   BMI 29.84 kg/m     Wt Readings from Last 3 Encounters:  03/16/17 163 lb 1.9 oz (74 kg)  11/07/16 169 lb 5 oz (76.8 kg)  09/07/15 161 lb 6.4 oz (73.2 kg)     GEN: Patient is in no acute distress HEENT: Normal NECK: No JVD; No carotid bruits LYMPHATICS: No lymphadenopathy CARDIAC: S1 S2 regular, 2/6 systolic murmur at the apex. RESPIRATORY:  Clear to auscultation without rales, wheezing or rhonchi  ABDOMEN: Soft,  non-tender, non-distended MUSCULOSKELETAL:  No edema; No deformity  SKIN: Warm and dry NEUROLOGIC:  Alert and oriented x 3 PSYCHIATRIC:  Normal affect    Signed, [name], MD  03/16/2017 9:50 AM    [place]

## 2017-03-17 LAB — BASIC METABOLIC PANEL
BUN/Creatinine Ratio: 15 (ref 9–23)
BUN: 8 mg/dL (ref 6–24)
CHLORIDE: 101 mmol/L (ref 96–106)
CO2: 22 mmol/L (ref 20–29)
Calcium: 9.3 mg/dL (ref 8.7–10.2)
Creatinine, Ser: 0.55 mg/dL — ABNORMAL LOW (ref 0.57–1.00)
GFR calc Af Amer: 129 mL/min/{1.73_m2} (ref >59)
GFR, EST NON AFRICAN AMERICAN: 112 mL/min/{1.73_m2} (ref >59)
Glucose: 105 mg/dL — ABNORMAL HIGH (ref 65–99)
POTASSIUM: 3.9 mmol/L (ref 3.5–5.2)
Sodium: 139 mmol/L (ref 134–144)

## 2017-03-17 LAB — CBC WITH DIFFERENTIAL/PLATELET
Basophils Absolute: 0 10*3/uL (ref 0.0–0.2)
Basos: 1 %
EOS (ABSOLUTE): 0.1 10*3/uL (ref 0.0–0.4)
EOS: 3 %
HEMATOCRIT: 39 % (ref 34.0–46.6)
Hemoglobin: 13.4 g/dL (ref 11.1–15.9)
IMMATURE GRANULOCYTES: 0 %
Immature Grans (Abs): 0 10*3/uL (ref 0.0–0.1)
Lymphocytes Absolute: 1.2 10*3/uL (ref 0.7–3.1)
Lymphs: 26 %
MCH: 32.3 pg (ref 26.6–33.0)
MCHC: 34.4 g/dL (ref 31.5–35.7)
MCV: 94 fL (ref 79–97)
MONOCYTES: 8 %
MONOS ABS: 0.4 10*3/uL (ref 0.1–0.9)
Neutrophils Absolute: 2.9 10*3/uL (ref 1.4–7.0)
Neutrophils: 62 %
Platelets: 235 10*3/uL (ref 150–379)
RBC: 4.15 x10E6/uL (ref 3.77–5.28)
RDW: 16.4 % — AB (ref 12.3–15.4)
WBC: 4.7 10*3/uL (ref 3.4–10.8)

## 2017-03-17 LAB — PROTIME-INR
INR: 1 (ref 0.8–1.2)
PROTHROMBIN TIME: 10.8 s (ref 9.1–12.0)

## 2017-03-19 ENCOUNTER — Telehealth: Payer: Self-pay | Admitting: *Deleted

## 2017-03-19 NOTE — Telephone Encounter (Signed)
Patient contacted pre-catheterization at [facility] scheduled for: 03/23/17 7:30 AM Verified arrival time and place: [facility] NT/Main A 5:30 AM Confirmed AM meds to be taken pre-cath with sip of water: ASA 81 mg AM of  Confirmed patient has responsible person to drive home post procedure and observe patient for 24 hours: yes  Addl concerns:   Pt did say she had liver biopsy x2  in past,  received fentanyl pre biopsy, had severe nausea and vomiting after, was told most likely reaction to fentanyl, I have added this to her allergies and have asked her to discuss this with nurses/staff at hospital Monday morning.

## 2017-03-23 ENCOUNTER — Other Ambulatory Visit: Payer: Self-pay

## 2017-03-23 ENCOUNTER — Encounter (HOSPITAL_COMMUNITY)
Admission: RE | Disposition: A | Discharge status: Home / Self Care | Payer: Self-pay | Source: Ambulatory Visit | Attending: Cardiothoracic Surgery

## 2017-03-23 ENCOUNTER — Inpatient Hospital Stay (HOSPITAL_COMMUNITY)
Admission: RE | Admit: 2017-03-23 | Discharge: 2017-04-03 | DRG: 233 | Disposition: A | Discharge status: Home / Self Care | Payer: 59 | Source: Ambulatory Visit | Attending: Cardiothoracic Surgery | Admitting: Cardiothoracic Surgery

## 2017-03-23 ENCOUNTER — Inpatient Hospital Stay (HOSPITAL_COMMUNITY): Payer: 59

## 2017-03-23 ENCOUNTER — Encounter (HOSPITAL_COMMUNITY): Payer: Self-pay | Admitting: *Deleted

## 2017-03-23 ENCOUNTER — Ambulatory Visit (HOSPITAL_COMMUNITY): Payer: 59

## 2017-03-23 ENCOUNTER — Other Ambulatory Visit: Payer: Self-pay | Admitting: *Deleted

## 2017-03-23 DIAGNOSIS — I25110 Atherosclerotic heart disease of native coronary artery with unstable angina pectoris: Secondary | ICD-10-CM | POA: Diagnosis not present

## 2017-03-23 DIAGNOSIS — E8770 Fluid overload, unspecified: Secondary | ICD-10-CM | POA: Diagnosis not present

## 2017-03-23 DIAGNOSIS — E785 Hyperlipidemia, unspecified: Secondary | ICD-10-CM | POA: Diagnosis present

## 2017-03-23 DIAGNOSIS — I25810 Atherosclerosis of coronary artery bypass graft(s) without angina pectoris: Secondary | ICD-10-CM | POA: Diagnosis not present

## 2017-03-23 DIAGNOSIS — J189 Pneumonia, unspecified organism: Secondary | ICD-10-CM | POA: Diagnosis not present

## 2017-03-23 DIAGNOSIS — D689 Coagulation defect, unspecified: Secondary | ICD-10-CM | POA: Diagnosis present

## 2017-03-23 DIAGNOSIS — Z7982 Long term (current) use of aspirin: Secondary | ICD-10-CM

## 2017-03-23 DIAGNOSIS — Z791 Long term (current) use of non-steroidal anti-inflammatories (NSAID): Secondary | ICD-10-CM | POA: Diagnosis not present

## 2017-03-23 DIAGNOSIS — Z792 Long term (current) use of antibiotics: Secondary | ICD-10-CM | POA: Diagnosis not present

## 2017-03-23 DIAGNOSIS — E669 Obesity, unspecified: Secondary | ICD-10-CM | POA: Diagnosis present

## 2017-03-23 DIAGNOSIS — I361 Nonrheumatic tricuspid (valve) insufficiency: Secondary | ICD-10-CM | POA: Diagnosis not present

## 2017-03-23 DIAGNOSIS — J90 Pleural effusion, not elsewhere classified: Secondary | ICD-10-CM | POA: Diagnosis not present

## 2017-03-23 DIAGNOSIS — K589 Irritable bowel syndrome without diarrhea: Secondary | ICD-10-CM | POA: Diagnosis present

## 2017-03-23 DIAGNOSIS — M1711 Unilateral primary osteoarthritis, right knee: Secondary | ICD-10-CM | POA: Diagnosis present

## 2017-03-23 DIAGNOSIS — K743 Primary biliary cirrhosis: Secondary | ICD-10-CM | POA: Diagnosis not present

## 2017-03-23 DIAGNOSIS — K5903 Drug induced constipation: Secondary | ICD-10-CM | POA: Diagnosis not present

## 2017-03-23 DIAGNOSIS — I454 Nonspecific intraventricular block: Secondary | ICD-10-CM | POA: Diagnosis present

## 2017-03-23 DIAGNOSIS — I25118 Atherosclerotic heart disease of native coronary artery with other forms of angina pectoris: Secondary | ICD-10-CM

## 2017-03-23 DIAGNOSIS — R945 Abnormal results of liver function studies: Secondary | ICD-10-CM

## 2017-03-23 DIAGNOSIS — D72829 Elevated white blood cell count, unspecified: Secondary | ICD-10-CM | POA: Diagnosis present

## 2017-03-23 DIAGNOSIS — R590 Localized enlarged lymph nodes: Secondary | ICD-10-CM | POA: Diagnosis not present

## 2017-03-23 DIAGNOSIS — G4700 Insomnia, unspecified: Secondary | ICD-10-CM | POA: Diagnosis present

## 2017-03-23 DIAGNOSIS — Y92230 Patient room in hospital as the place of occurrence of the external cause: Secondary | ICD-10-CM | POA: Diagnosis not present

## 2017-03-23 DIAGNOSIS — I2510 Atherosclerotic heart disease of native coronary artery without angina pectoris: Secondary | ICD-10-CM | POA: Diagnosis present

## 2017-03-23 DIAGNOSIS — F410 Panic disorder [episodic paroxysmal anxiety] without agoraphobia: Secondary | ICD-10-CM | POA: Diagnosis not present

## 2017-03-23 DIAGNOSIS — R0602 Shortness of breath: Secondary | ICD-10-CM | POA: Diagnosis not present

## 2017-03-23 DIAGNOSIS — Z01810 Encounter for preprocedural cardiovascular examination: Secondary | ICD-10-CM | POA: Diagnosis not present

## 2017-03-23 DIAGNOSIS — R7989 Other specified abnormal findings of blood chemistry: Secondary | ICD-10-CM

## 2017-03-23 DIAGNOSIS — K219 Gastro-esophageal reflux disease without esophagitis: Secondary | ICD-10-CM | POA: Diagnosis present

## 2017-03-23 DIAGNOSIS — Z881 Allergy status to other antibiotic agents status: Secondary | ICD-10-CM

## 2017-03-23 DIAGNOSIS — R112 Nausea with vomiting, unspecified: Secondary | ICD-10-CM | POA: Diagnosis not present

## 2017-03-23 DIAGNOSIS — Z833 Family history of diabetes mellitus: Secondary | ICD-10-CM

## 2017-03-23 DIAGNOSIS — D62 Acute posthemorrhagic anemia: Secondary | ICD-10-CM | POA: Diagnosis not present

## 2017-03-23 DIAGNOSIS — R42 Dizziness and giddiness: Secondary | ICD-10-CM | POA: Diagnosis not present

## 2017-03-23 DIAGNOSIS — K7290 Hepatic failure, unspecified without coma: Secondary | ICD-10-CM | POA: Diagnosis not present

## 2017-03-23 DIAGNOSIS — I959 Hypotension, unspecified: Secondary | ICD-10-CM | POA: Diagnosis not present

## 2017-03-23 DIAGNOSIS — J9811 Atelectasis: Secondary | ICD-10-CM | POA: Diagnosis not present

## 2017-03-23 DIAGNOSIS — R011 Cardiac murmur, unspecified: Secondary | ICD-10-CM | POA: Diagnosis present

## 2017-03-23 DIAGNOSIS — T40605A Adverse effect of unspecified narcotics, initial encounter: Secondary | ICD-10-CM | POA: Diagnosis not present

## 2017-03-23 DIAGNOSIS — Z8249 Family history of ischemic heart disease and other diseases of the circulatory system: Secondary | ICD-10-CM | POA: Diagnosis not present

## 2017-03-23 DIAGNOSIS — Z87440 Personal history of urinary (tract) infections: Secondary | ICD-10-CM

## 2017-03-23 DIAGNOSIS — Z79899 Other long term (current) drug therapy: Secondary | ICD-10-CM | POA: Diagnosis not present

## 2017-03-23 DIAGNOSIS — Z885 Allergy status to narcotic agent status: Secondary | ICD-10-CM

## 2017-03-23 DIAGNOSIS — R918 Other nonspecific abnormal finding of lung field: Secondary | ICD-10-CM | POA: Diagnosis not present

## 2017-03-23 DIAGNOSIS — Z951 Presence of aortocoronary bypass graft: Secondary | ICD-10-CM

## 2017-03-23 HISTORY — PX: LEFT HEART CATH AND CORONARY ANGIOGRAPHY: CATH118249

## 2017-03-23 HISTORY — DX: Atherosclerotic heart disease of native coronary artery without angina pectoris: I25.10

## 2017-03-23 LAB — LIPID PANEL
Cholesterol: 406 mg/dL — ABNORMAL HIGH (ref 0–200)
HDL: 10 mg/dL — ABNORMAL LOW (ref >40)
LDL Cholesterol: 334 mg/dL — ABNORMAL HIGH (ref 0–99)
Total CHOL/HDL Ratio: 40.6 RATIO
Triglycerides: 309 mg/dL — ABNORMAL HIGH (ref <150)
VLDL: 62 mg/dL — ABNORMAL HIGH (ref 0–40)

## 2017-03-23 LAB — PREGNANCY, URINE: Preg Test, Ur: NEGATIVE

## 2017-03-23 LAB — PULMONARY FUNCTION TEST
FEF 25-75 Post: 2.93 L/sec
FEF 25-75 Pre: 2.87 L/sec
FEF2575-%Change-Post: 2 %
FEF2575-%Pred-Post: 105 %
FEF2575-%Pred-Pre: 103 %
FEV1-%Change-Post: 0 %
FEV1-%Pred-Post: 96 %
FEV1-%Pred-Pre: 96 %
FEV1-Post: 2.59 L
FEV1-Pre: 2.59 L
FEV1FVC-%Change-Post: 1 %
FEV1FVC-%Pred-Pre: 102 %
FEV6-%Change-Post: -1 %
FEV6-%Pred-Post: 94 %
FEV6-%Pred-Pre: 95 %
FEV6-Post: 3.08 L
FEV6-Pre: 3.11 L
FEV6FVC-%Change-Post: 0 %
FEV6FVC-%Pred-Post: 101 %
FEV6FVC-%Pred-Pre: 102 %
FVC-%Change-Post: -1 %
FVC-%Pred-Post: 91 %
FVC-%Pred-Pre: 92 %
FVC-Post: 3.08 L
FVC-Pre: 3.11 L
Post FEV1/FVC ratio: 84 %
Post FEV6/FVC ratio: 100 %
Pre FEV1/FVC ratio: 83 %
Pre FEV6/FVC Ratio: 100 %

## 2017-03-23 LAB — HEPATIC FUNCTION PANEL
ALT: 63 U/L — ABNORMAL HIGH (ref 14–54)
AST: 83 U/L — ABNORMAL HIGH (ref 15–41)
Albumin: 2.9 g/dL — ABNORMAL LOW (ref 3.5–5.0)
Alkaline Phosphatase: 227 U/L — ABNORMAL HIGH (ref 38–126)
Bilirubin, Direct: 4.7 mg/dL — ABNORMAL HIGH (ref 0.1–0.5)
Indirect Bilirubin: 3.3 mg/dL — ABNORMAL HIGH (ref 0.3–0.9)
Total Bilirubin: 8 mg/dL — ABNORMAL HIGH (ref 0.3–1.2)
Total Protein: 7 g/dL (ref 6.5–8.1)

## 2017-03-23 LAB — PREPARE RBC (CROSSMATCH)

## 2017-03-23 LAB — MRSA PCR SCREENING: MRSA by PCR: NEGATIVE

## 2017-03-23 LAB — HEPARIN LEVEL (UNFRACTIONATED): Heparin Unfractionated: <0.1 IU/mL — ABNORMAL LOW (ref 0.30–0.70)

## 2017-03-23 SURGERY — LEFT HEART CATH AND CORONARY ANGIOGRAPHY
Anesthesia: LOCAL

## 2017-03-23 MED ORDER — MIDAZOLAM HCL 2 MG/2ML IJ SOLN
INTRAMUSCULAR | Status: AC
  Filled 2017-03-23: qty 2

## 2017-03-23 MED ORDER — DOPAMINE-DEXTROSE 3.2-5 MG/ML-% IV SOLN
0.0000 ug/kg/min | INTRAVENOUS | Status: DC
  Filled 2017-03-23: qty 250

## 2017-03-23 MED ORDER — NITROGLYCERIN 1 MG/10 ML FOR IR/CATH LAB
INTRA_ARTERIAL | Status: DC | PRN
  Administered 2017-03-23: 100 ug via INTRACORONARY

## 2017-03-23 MED ORDER — NITROGLYCERIN 0.4 MG SL SUBL: 0.4000 mg | SUBLINGUAL_TABLET | SUBLINGUAL | Status: DC | PRN

## 2017-03-23 MED ORDER — SODIUM CHLORIDE 0.9% FLUSH: 3.0000 mL | INTRAVENOUS | Status: DC | PRN

## 2017-03-23 MED ORDER — ATORVASTATIN CALCIUM 80 MG PO TABS
80.0000 mg | ORAL_TABLET | Freq: Every day | ORAL | Status: DC
  Administered 2017-03-23: 80 mg via ORAL
  Filled 2017-03-23: qty 1

## 2017-03-23 MED ORDER — IOPAMIDOL (ISOVUE-370) INJECTION 76%
INTRAVENOUS | Status: DC | PRN
  Administered 2017-03-23: 65 mL via INTRA_ARTERIAL

## 2017-03-23 MED ORDER — NITROGLYCERIN IN D5W 200-5 MCG/ML-% IV SOLN
2.0000 ug/min | INTRAVENOUS | Status: DC
  Filled 2017-03-23: qty 250

## 2017-03-23 MED ORDER — HEPARIN BOLUS VIA INFUSION
1500.0000 [IU] | Freq: Once | INTRAVENOUS | Status: AC
  Administered 2017-03-23: 1500 [IU] via INTRAVENOUS
  Filled 2017-03-23: qty 1500

## 2017-03-23 MED ORDER — VERAPAMIL HCL 2.5 MG/ML IV SOLN
INTRAVENOUS | Status: AC
Start: 2017-03-23 — End: 2017-03-23
  Filled 2017-03-23: qty 2

## 2017-03-23 MED ORDER — TRIMETHOPRIM 100 MG PO TABS
100.0000 mg | ORAL_TABLET | ORAL | Status: DC
  Filled 2017-03-23: qty 1

## 2017-03-23 MED ORDER — MIDAZOLAM HCL 2 MG/2ML IJ SOLN
INTRAMUSCULAR | Status: DC | PRN
  Administered 2017-03-23: 1 mg via INTRAVENOUS
  Administered 2017-03-23: 2 mg via INTRAVENOUS
  Administered 2017-03-23 (×2): 1 mg via INTRAVENOUS

## 2017-03-23 MED ORDER — TRIMETHOPRIM 100 MG PO TABS
100.0000 mg | ORAL_TABLET | ORAL | Status: DC
  Administered 2017-03-26 – 2017-04-01 (×4): 100 mg via ORAL
  Filled 2017-03-23 (×6): qty 1

## 2017-03-23 MED ORDER — ASPIRIN 81 MG PO CHEW: 81.0000 mg | CHEWABLE_TABLET | ORAL | Status: DC

## 2017-03-23 MED ORDER — PANTOPRAZOLE SODIUM 40 MG PO TBEC: 40.0000 mg | DELAYED_RELEASE_TABLET | Freq: Every day | ORAL | Status: DC

## 2017-03-23 MED ORDER — POTASSIUM CHLORIDE 2 MEQ/ML IV SOLN
80.0000 meq | INTRAVENOUS | Status: DC
  Filled 2017-03-23: qty 40

## 2017-03-23 MED ORDER — CHLORHEXIDINE GLUCONATE 4 % EX LIQD: 60.0000 mL | Freq: Once | CUTANEOUS | Status: DC

## 2017-03-23 MED ORDER — SODIUM CHLORIDE 0.9 % IV SOLN: 250.0000 mL | INTRAVENOUS | Status: DC | PRN

## 2017-03-23 MED ORDER — MILRINONE LACTATE IN DEXTROSE 20-5 MG/100ML-% IV SOLN
0.1250 ug/kg/min | INTRAVENOUS | Status: AC
  Administered 2017-03-24: .25 ug/kg/min via INTRAVENOUS
  Filled 2017-03-23: qty 100

## 2017-03-23 MED ORDER — URSODIOL 300 MG PO CAPS: 600.0000 mg | ORAL_CAPSULE | Freq: Two times a day (BID) | ORAL | Status: DC

## 2017-03-23 MED ORDER — VERAPAMIL HCL 2.5 MG/ML IV SOLN
INTRAVENOUS | Status: DC | PRN
  Administered 2017-03-23: 10 mL via INTRA_ARTERIAL

## 2017-03-23 MED ORDER — SODIUM CHLORIDE 0.9 % IV SOLN
INTRAVENOUS | Status: AC
  Administered 2017-03-23 (×2): 75 mL/h via INTRAVENOUS

## 2017-03-23 MED ORDER — HEPARIN (PORCINE) IN NACL 2-0.9 UNIT/ML-% IJ SOLN
INTRAMUSCULAR | Status: AC
  Filled 2017-03-23: qty 1000

## 2017-03-23 MED ORDER — ALPRAZOLAM 0.25 MG PO TABS
0.2500 mg | ORAL_TABLET | ORAL | Status: DC | PRN
  Administered 2017-03-23: 0.5 mg via ORAL
  Filled 2017-03-23: qty 2

## 2017-03-23 MED ORDER — HEPARIN (PORCINE) IN NACL 100-0.45 UNIT/ML-% IJ SOLN
1100.0000 [IU]/h | INTRAMUSCULAR | Status: DC
  Administered 2017-03-23: 900 [IU]/h via INTRAVENOUS
  Filled 2017-03-23: qty 250

## 2017-03-23 MED ORDER — DEXMEDETOMIDINE HCL IN NACL 400 MCG/100ML IV SOLN
0.1000 ug/kg/h | INTRAVENOUS | Status: DC
  Filled 2017-03-23: qty 100

## 2017-03-23 MED ORDER — DEXTROSE 5 % IV SOLN
1.5000 g | INTRAVENOUS | Status: AC
  Administered 2017-03-24: 1.5 g via INTRAVENOUS
  Administered 2017-03-24: .75 g via INTRAVENOUS
  Filled 2017-03-23: qty 1.5

## 2017-03-23 MED ORDER — ONDANSETRON HCL 4 MG/2ML IJ SOLN: 4.0000 mg | Freq: Four times a day (QID) | INTRAMUSCULAR | Status: DC | PRN

## 2017-03-23 MED ORDER — SODIUM CHLORIDE 0.9 % IV SOLN
30.0000 ug/min | INTRAVENOUS | Status: DC
  Filled 2017-03-23: qty 2

## 2017-03-23 MED ORDER — MAGNESIUM SULFATE 50 % IJ SOLN
40.0000 meq | INTRAMUSCULAR | Status: DC
  Filled 2017-03-23: qty 9.85

## 2017-03-23 MED ORDER — DIPHENHYDRAMINE HCL 25 MG PO CAPS: 50.0000 mg | ORAL_CAPSULE | Freq: Every evening | ORAL | Status: DC | PRN

## 2017-03-23 MED ORDER — HEPARIN (PORCINE) IN NACL 2-0.9 UNIT/ML-% IJ SOLN
INTRAMUSCULAR | Status: AC | PRN
  Administered 2017-03-23: 1000 mL

## 2017-03-23 MED ORDER — SODIUM CHLORIDE 0.9 % IV SOLN
INTRAVENOUS | Status: DC
  Filled 2017-03-23: qty 1

## 2017-03-23 MED ORDER — IOPAMIDOL (ISOVUE-370) INJECTION 76%
INTRAVENOUS | Status: AC
  Filled 2017-03-23: qty 100

## 2017-03-23 MED ORDER — ACETAMINOPHEN 325 MG PO TABS: 650.0000 mg | ORAL_TABLET | ORAL | Status: DC | PRN

## 2017-03-23 MED ORDER — TRANEXAMIC ACID (OHS) BOLUS VIA INFUSION
15.0000 mg/kg | INTRAVENOUS | Status: AC
  Administered 2017-03-24: 1102.5 mg via INTRAVENOUS
  Filled 2017-03-23: qty 1103

## 2017-03-23 MED ORDER — ASPIRIN 81 MG PO CHEW: 81.0000 mg | CHEWABLE_TABLET | Freq: Every day | ORAL | Status: DC

## 2017-03-23 MED ORDER — METOPROLOL TARTRATE 12.5 MG HALF TABLET: 12.5000 mg | ORAL_TABLET | Freq: Once | ORAL | Status: DC

## 2017-03-23 MED ORDER — HEPARIN SODIUM (PORCINE) 1000 UNIT/ML IJ SOLN
INTRAMUSCULAR | Status: DC | PRN
  Administered 2017-03-23: 1000 [IU] via INTRAVENOUS
  Administered 2017-03-23: 3500 [IU] via INTRAVENOUS

## 2017-03-23 MED ORDER — SODIUM CHLORIDE 0.9% FLUSH
3.0000 mL | Freq: Two times a day (BID) | INTRAVENOUS | Status: DC
  Administered 2017-03-23 (×2): 3 mL via INTRAVENOUS

## 2017-03-23 MED ORDER — ALBUTEROL SULFATE (2.5 MG/3ML) 0.083% IN NEBU
2.5000 mg | INHALATION_SOLUTION | Freq: Once | RESPIRATORY_TRACT | Status: AC
  Administered 2017-03-23: 2.5 mg via RESPIRATORY_TRACT

## 2017-03-23 MED ORDER — URSODIOL 300 MG PO CAPS
900.0000 mg | ORAL_CAPSULE | Freq: Every day | ORAL | Status: DC
  Administered 2017-03-23 – 2017-04-03 (×10): 900 mg via ORAL
  Filled 2017-03-23 (×13): qty 3

## 2017-03-23 MED ORDER — TRANEXAMIC ACID 1000 MG/10ML IV SOLN
1.5000 mg/kg/h | INTRAVENOUS | Status: AC
  Administered 2017-03-24: 1.5 mg/kg/h via INTRAVENOUS
  Filled 2017-03-23: qty 25

## 2017-03-23 MED ORDER — VERAPAMIL HCL 2.5 MG/ML IV SOLN
INTRAVENOUS | Status: DC | PRN
  Administered 2017-03-23: 08:00:00 via INTRA_ARTERIAL

## 2017-03-23 MED ORDER — SODIUM CHLORIDE 0.9 % IV SOLN
INTRAVENOUS | Status: DC
  Filled 2017-03-23: qty 30

## 2017-03-23 MED ORDER — SODIUM CHLORIDE 0.9% FLUSH: 3.0000 mL | Freq: Two times a day (BID) | INTRAVENOUS | Status: DC

## 2017-03-23 MED ORDER — TEMAZEPAM 15 MG PO CAPS: 15.0000 mg | ORAL_CAPSULE | Freq: Once | ORAL | Status: DC | PRN

## 2017-03-23 MED ORDER — BISACODYL 5 MG PO TBEC: 5.0000 mg | DELAYED_RELEASE_TABLET | Freq: Once | ORAL | Status: DC

## 2017-03-23 MED ORDER — EPINEPHRINE PF 1 MG/ML IJ SOLN
0.0000 ug/min | INTRAMUSCULAR | Status: DC
  Filled 2017-03-23: qty 4

## 2017-03-23 MED ORDER — CHLORHEXIDINE GLUCONATE 0.12 % MT SOLN: 15.0000 mL | Freq: Once | OROMUCOSAL | Status: DC

## 2017-03-23 MED ORDER — SODIUM CHLORIDE 0.9 % WEIGHT BASED INFUSION: 1.0000 mL/kg/h | INTRAVENOUS | Status: DC

## 2017-03-23 MED ORDER — LIDOCAINE HCL (PF) 1 % IJ SOLN
INTRAMUSCULAR | Status: AC
Start: 2017-03-23 — End: 2017-03-23
  Filled 2017-03-23: qty 30

## 2017-03-23 MED ORDER — HEPARIN SODIUM (PORCINE) 1000 UNIT/ML IJ SOLN
INTRAMUSCULAR | Status: AC
  Filled 2017-03-23: qty 1

## 2017-03-23 MED ORDER — TRANEXAMIC ACID (OHS) PUMP PRIME SOLUTION
2.0000 mg/kg | INTRAVENOUS | Status: DC
  Filled 2017-03-23: qty 1.47

## 2017-03-23 MED ORDER — PLASMA-LYTE 148 IV SOLN
INTRAVENOUS | Status: AC
  Administered 2017-03-24: 500 mL
  Filled 2017-03-23: qty 2.5

## 2017-03-23 MED ORDER — URSODIOL 300 MG PO CAPS
600.0000 mg | ORAL_CAPSULE | Freq: Every day | ORAL | Status: DC
  Administered 2017-03-23 – 2017-04-02 (×9): 600 mg via ORAL
  Filled 2017-03-23 (×13): qty 2

## 2017-03-23 MED ORDER — ASPIRIN EC 81 MG PO TBEC: 162.0000 mg | DELAYED_RELEASE_TABLET | Freq: Every day | ORAL | Status: DC

## 2017-03-23 MED ORDER — CYCLOSPORINE 0.05 % OP EMUL
1.0000 [drp] | Freq: Every day | OPHTHALMIC | Status: DC
  Administered 2017-03-26 – 2017-03-31 (×4): 1 [drp] via OPHTHALMIC
  Filled 2017-03-23 (×12): qty 1

## 2017-03-23 MED ORDER — MELATONIN 3 MG PO TABS
6.0000 mg | ORAL_TABLET | Freq: Every day | ORAL | Status: DC
  Administered 2017-03-23: 6 mg via ORAL
  Filled 2017-03-23: qty 2

## 2017-03-23 MED ORDER — ALPRAZOLAM 0.25 MG PO TABS: 0.2500 mg | ORAL_TABLET | ORAL | Status: DC | PRN

## 2017-03-23 MED ORDER — VANCOMYCIN HCL 10 G IV SOLR
1250.0000 mg | INTRAVENOUS | Status: AC
  Administered 2017-03-24: 1250 mg via INTRAVENOUS
  Filled 2017-03-23: qty 1250

## 2017-03-23 MED ORDER — NITROGLYCERIN 1 MG/10 ML FOR IR/CATH LAB
INTRA_ARTERIAL | Status: AC
  Filled 2017-03-23: qty 10

## 2017-03-23 MED ORDER — DEXTROSE 5 % IV SOLN
750.0000 mg | INTRAVENOUS | Status: DC
  Filled 2017-03-23: qty 750

## 2017-03-23 MED ORDER — SODIUM CHLORIDE 0.9 % WEIGHT BASED INFUSION
3.0000 mL/kg/h | INTRAVENOUS | Status: DC
  Administered 2017-03-23: 3 mL/kg/h via INTRAVENOUS

## 2017-03-23 MED ORDER — LIDOCAINE HCL (PF) 1 % IJ SOLN
INTRAMUSCULAR | Status: DC | PRN
  Administered 2017-03-23: 2 mL

## 2017-03-23 SURGICAL SUPPLY — 14 items
CATH 5FR JL3.5 JR4 ANG PIG MP (CATHETERS) ×2 IMPLANT
CATH LAUNCHER 5F EBU3.0 (CATHETERS) ×1 IMPLANT
CATHETER LAUNCHER 5F EBU3.0 (CATHETERS) ×2
COVER PRB 48X5XTLSCP FOLD TPE (BAG) ×1 IMPLANT
COVER PROBE 5X48 (BAG) ×2
DEVICE RAD COMP TR BAND LRG (VASCULAR PRODUCTS) ×2 IMPLANT
GLIDESHEATH SLEND SS 6F .021 (SHEATH) ×2 IMPLANT
GUIDEWIRE INQWIRE 1.5J.035X260 (WIRE) ×1 IMPLANT
INQWIRE 1.5J .035X260CM (WIRE) ×2
KIT HEART LEFT (KITS) ×2 IMPLANT
PACK CARDIAC CATHETERIZATION (CUSTOM PROCEDURE TRAY) ×2 IMPLANT
TRANSDUCER W/STOPCOCK (MISCELLANEOUS) ×2 IMPLANT
TUBING CIL FLEX 10 FLL-RA (TUBING) ×2 IMPLANT
WIRE HI TORQ VERSACORE-J 145CM (WIRE) ×2 IMPLANT

## 2017-03-23 NOTE — Progress Notes (Addendum)
Pre-op Cardiac Surgery *Preliminary Results* Carotid artery duplex has been completed. Bilateral internal carotid arteries are near-normal with only minimal wall thickening or plaque. Vertebral arteries are patent with antegrade flow.  03/23/2017 10:51 AM  Suzane Vanderweide, BS, RVT, RDCS, RDMS    Limb Dopplers pending. Upper Extremity Right Left  Brachial Pressures    Radial Waveforms    Ulnar Waveforms    Palmar Arch (Allen's Test)     Findings:      Lower  Extremity Right Left  Dorsalis Pedis    Anterior Tibial    Posterior Tibial    Ankle/Brachial Indices      Findings:

## 2017-03-23 NOTE — Anesthesia Preprocedure Evaluation (Addendum)
Anesthesia Evaluation  Patient identified by MRN, date of birth, ID band Patient awake    Reviewed: Allergy & Precautions, NPO status , Patient's Chart, lab work & pertinent test results  Airway Mallampati: II  TM Distance: >3 FB Neck ROM: Full    Dental  (+) Dental Advisory Given   Pulmonary neg pulmonary ROS,    breath sounds clear to auscultation       Cardiovascular + angina with exertion + CAD   Rhythm:Regular Rate:Normal     Neuro/Psych negative neurological ROS     GI/Hepatic GERD  ,(+) Cirrhosis       ,   Endo/Other  negative endocrine ROS  Renal/GU negative Renal ROS     Musculoskeletal   Abdominal   Peds  Hematology negative hematology ROS (+)   Anesthesia Other Findings   Reproductive/Obstetrics                            Lab Results  Component Value Date   WBC 4.7 03/16/2017   HGB 13.4 03/16/2017   HCT 39.0 03/16/2017   MCV 94 03/16/2017   PLT 235 03/16/2017   Lab Results  Component Value Date   INR 1.0 03/16/2017   INR 0.89 08/19/2012   INR 0.9 09/15/2007   Lab Results  Component Value Date   CREATININE 0.55 (L) 03/16/2017   BUN 8 03/16/2017   NA 139 03/16/2017   K 3.9 03/16/2017   CL 101 03/16/2017   CO2 22 03/16/2017    Anesthesia Physical Anesthesia Plan  ASA: IV  Anesthesia Plan: General   Post-op Pain Management:    Induction: Intravenous  PONV Risk Score and Plan: 3 and Ondansetron, Dexamethasone and Treatment may vary due to age or medical condition  Airway Management Planned: Oral ETT  Additional Equipment: Arterial line, CVP, PA Cath, TEE and Ultrasound Guidance Line Placement  Intra-op Plan:   Post-operative Plan: Post-operative intubation/ventilation  Informed Consent: I have reviewed the patients History and Physical, chart, labs and discussed the procedure including the risks, benefits and alternatives for the proposed anesthesia  with the patient or authorized representative who has indicated his/her understanding and acceptance.   Dental advisory given  Plan Discussed with: CRNA  Anesthesia Plan Comments:        Anesthesia Quick Evaluation

## 2017-03-23 NOTE — Interval H&P Note (Signed)
Cath Lab Visit (complete for each Cath Lab visit)  Clinical Evaluation Leading to the Procedure:   ACS: No.  Non-ACS:    Anginal Classification: CCS III  Anti-ischemic medical therapy: Minimal Therapy (1 class of medications)  Non-Invasive Test Results: No non-invasive testing performed  Prior CABG: No previous CABG   Persistent sx concerning for angina, occasionally at rest. Took SL NTG with relief.   History and Physical Interval Note:  03/23/2017 7:29 AM  Christine Butler  has presented today for surgery, with the diagnosis of angina  The various methods of treatment have been discussed with the patient and family. After consideration of risks, benefits and other options for treatment, the patient has consented to  Procedure(s): LEFT HEART CATH AND CORONARY ANGIOGRAPHY (N/A) as a surgical intervention .  The patient's history has been reviewed, patient examined, no change in status, stable for surgery.  I have reviewed the patient's chart and labs.  Questions were answered to the patient's satisfaction.     Larae Grooms

## 2017-03-23 NOTE — Progress Notes (Signed)
This RN received a call from blood bank asking if we still need blood and the type and screen for this pt because it was discontinued. MD was notified and ordered verbally to have the type and screen done.

## 2017-03-23 NOTE — Progress Notes (Signed)
[id number] Gave pt [facility] booklet and care guide in case she has surgery. Briefly reviewed need for someone after discharge 24/7. Discussed importance of IS and mobility after surgery for pulmonary function. Graylon Good RN BSN 03/23/2017 2:48 PM

## 2017-03-23 NOTE — Consult Note (Signed)
[address]       [place] [address]             [phone]        Dann L Reichelt  Medical Record #[id number] Date of Birth: 1970/08/26  Referring: Irish Lack Primary Care: [name], MD (Inactive) Primary Cardiologist:No primary care provider on file.  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 For the past few months she has had symptoms of exertional chest pain. She was evaluated in the ED in October 2018. She underwent a echo which was normal. She underwent a GI evaluation with upper endoscopy which was normal. The pain has persisted and she was evaluated by cardiology and recommended for cardiac catheterization. This was performed today as an outpatient via right radial artery by Dr.Varanasi. The patient has a high-grade 95% stenosis of the LAD diagonal bifurcation. LV function is preserved. LVEDP is normal. She is been placed on IV nitroglycerin and heparin because of accelerating chest pain. She was not felt to be a candidate for percutaneous intervention-stent due to the coronary anatomy and small vessel size. CABG was recommended by her cardiologist.  The patient has a very strong family history of coronary disease with multiple family members having had CABG, PCI, or MI. The patient does not smoke. She does not have hypertension. She is unsure of her lipid panel but has not been told she has hyper lipidemia. She is not diabetic.   Current Activity/ Functional Status: The patient I has been working full-time as a Geologist, engineering at [employer]: At the time of surgery this patient's most appropriate activity status/level should be described as: []     0    Normal activity, no  symptoms [x]     1    Restricted in physical strenuous activity but ambulatory, able to do out light work []     2    Ambulatory and capable of self care, unable to do work activities, up and about                 more than 50%  Of the time                            []     3    Only limited self care, in bed greater than 50% of waking hours []     4    Completely disabled, no self care, confined to bed or chair []     5    Moribund  Past Medical History:  Diagnosis Date  . Arthritis    right knee  . GERD (gastroesophageal reflux disease)   . IBS (irritable bowel syndrome)   . PONV (postoperative nausea and vomiting)   . Primary biliary cholangitis ([place])   . Primary biliary cirrhosis ([place])   . SVD (spontaneous vaginal delivery)    x 3  . Urinary tract bacterial infections     Past Surgical History:  Procedure Laterality Date  . INCONTINENCE SURGERY    . LEEP N/A 03/28/2014   Procedure: LOOP ELECTROSURGICAL EXCISION PROCEDURE (LEEP) cone biopsy;  Surgeon: Cyril Mourning, MD;  Location: [facility];  Service: Gynecology;  Laterality: N/A;  .  LIVER BIOPSY     x 2  . WISDOM TOOTH EXTRACTION      Social History   Tobacco Use  Smoking Status Never Smoker  Smokeless Tobacco Never Used    Social History   Substance and Sexual Activity  Alcohol Use Yes  . Alcohol/week: 0.6 oz  . Types: 1 Glasses of wine per week   Comment: for dinner    Social History   Socioeconomic History  . Marital status: Divorced    Spouse name: Not on file  . Number of children: Not on file  . Years of education: Not on file  . Highest education level: Not on file  Social Needs  . Financial resource strain: Not on file  . Food insecurity - worry: Not on file  . Food insecurity - inability: Not on file  . Transportation needs - medical: Not on file  . Transportation needs - non-medical: Not on file  Occupational History  . Not on file  Tobacco Use  . Smoking status: Never Smoker  . Smokeless  tobacco: Never Used  Substance and Sexual Activity  . Alcohol use: Yes    Alcohol/week: 0.6 oz    Types: 1 Glasses of wine per week    Comment: for dinner  . Drug use: No  . Sexual activity: Not on file  Other Topics Concern  . Not on file  Social History Narrative  . Not on file    Allergies  Allergen Reactions  . Codeine Nausea And Vomiting  . Erythromycin Nausea And Vomiting  . Fentanyl Nausea And Vomiting    Current Facility-Administered Medications  Medication Dose Route Frequency Provider Last Rate Last Dose  . 0.9 %  sodium chloride infusion   Intravenous Continuous Jettie Booze, MD 75 mL/hr at 03/23/17 1639 75 mL/hr at 03/23/17 1639  . 0.9 %  sodium chloride infusion  250 mL Intravenous PRN Jettie Booze, MD      . acetaminophen (TYLENOL) tablet 650 mg  650 mg Oral Q4H PRN Jettie Booze, MD      . aspirin chewable tablet 81 mg  81 mg Oral Daily Jettie Booze, MD      . atorvastatin (LIPITOR) tablet 80 mg  80 mg Oral q1800 Jettie Booze, MD      . Derrill Memo ON 03/24/2017] cefUROXime (ZINACEF) 1.5 g in dextrose 5 % 50 mL IVPB  1.5 g Intravenous To OR Prescott Gum, Collier Salina, MD      . Derrill Memo ON 03/24/2017] cefUROXime (ZINACEF) 750 mg in dextrose 5 % 50 mL IVPB  750 mg Intravenous To OR Prescott Gum, Collier Salina, MD      . cycloSPORINE (RESTASIS) 0.05 % ophthalmic emulsion 1 drop  1 drop Both Eyes Daily Jettie Booze, MD      . Derrill Memo ON 03/24/2017] dexmedetomidine (PRECEDEX) 400 MCG/100ML (4 mcg/mL) infusion  0.1-0.7 mcg/kg/hr Intravenous To OR Prescott Gum, Collier Salina, MD      . diphenhydrAMINE (BENADRYL) capsule 50 mg  50 mg Oral QHS PRN Jettie Booze, MD      . Derrill Memo ON 03/24/2017] DOPamine (INTROPIN) 800 mg in dextrose 5 % 250 mL (3.2 mg/mL) infusion  0-10 mcg/kg/min Intravenous To OR Prescott Gum, Collier Salina, MD      . Derrill Memo ON 03/24/2017] EPINEPHrine (ADRENALIN) 4 mg in dextrose 5 % 250 mL (0.016 mg/mL) infusion  0-10 mcg/min Intravenous To OR Prescott Gum,  Collier Salina, MD      . Derrill Memo ON 03/24/2017] heparin 2,500 Units,  papaverine 30 mg in electrolyte-148 (PLASMALYTE-148) 500 mL irrigation   Irrigation To OR Prescott Gum, Collier Salina, MD      . Derrill Memo ON 03/24/2017] heparin 30,000 units/NS 1000 mL solution for CELLSAVER   Other To OR Prescott Gum, Collier Salina, MD      . heparin ADULT infusion 100 units/mL (25000 units/220mL sodium chloride 0.45%)  900 Units/hr Intravenous Continuous Otilio Miu, RPH 9 mL/hr at 03/23/17 1420 900 Units/hr at 03/23/17 1420  . [START ON 03/24/2017] insulin regular (NOVOLIN R,HUMULIN R) 100 Units in sodium chloride 0.9 % 100 mL (1 Units/mL) infusion   Intravenous To OR Prescott Gum, Collier Salina, MD      . Derrill Memo ON 03/24/2017] magnesium sulfate (IV Push/IM) injection 40 mEq  40 mEq Other To OR Prescott Gum, Collier Salina, MD      . Melatonin TABS 6 mg  6 mg Oral QHS Jettie Booze, MD      . Derrill Memo ON 03/24/2017] milrinone (PRIMACOR) 20 MG/100 ML (0.2 mg/mL) infusion  0.125 mcg/kg/min Intravenous To OR Prescott Gum, Collier Salina, MD      . nitroGLYCERIN (NITROSTAT) SL tablet 0.4 mg  0.4 mg Sublingual Q5 min PRN Jettie Booze, MD      . Derrill Memo ON 03/24/2017] nitroGLYCERIN 50 mg in dextrose 5 % 250 mL (0.2 mg/mL) infusion  2-200 mcg/min Intravenous To OR Prescott Gum, Collier Salina, MD      . ondansetron [facility]) injection 4 mg  4 mg Intravenous Q6H PRN Jettie Booze, MD      . Derrill Memo ON 03/24/2017] pantoprazole (PROTONIX) EC tablet 40 mg  40 mg Oral Daily Jettie Booze, MD      . Derrill Memo ON 03/24/2017] phenylephrine (NEO-SYNEPHRINE) 20 mg in sodium chloride 0.9 % 250 mL (0.08 mg/mL) infusion  30-200 mcg/min Intravenous To OR Prescott Gum, Collier Salina, MD      . Derrill Memo ON 03/24/2017] potassium chloride injection 80 mEq  80 mEq Other To OR Prescott Gum, Collier Salina, MD      . sodium chloride flush (NS) 0.9 % injection 3 mL  3 mL Intravenous Q12H Larae Grooms S, MD      . sodium chloride flush (NS) 0.9 % injection 3 mL  3 mL Intravenous PRN Jettie Booze, MD      . Derrill Memo  ON 03/24/2017] tranexamic acid (CYKLOKAPRON) 2,500 mg in sodium chloride 0.9 % 250 mL (10 mg/mL) infusion  1.5 mg/kg/hr Intravenous To OR Prescott Gum, Collier Salina, MD      . Derrill Memo ON 03/24/2017] tranexamic acid (CYKLOKAPRON) bolus via infusion - over 30 minutes 1,102.5 mg  15 mg/kg Intravenous To OR Prescott Gum, Collier Salina, MD      . Derrill Memo ON 03/24/2017] tranexamic acid (CYKLOKAPRON) pump prime solution 147 mg  2 mg/kg Intracatheter To OR Prescott Gum, Collier Salina, MD      . Derrill Memo ON 03/24/2017] trimethoprim (TRIMPEX) tablet 100 mg  100 mg Oral QODAY Hammons, Theone Murdoch, RPH      . ursodiol (ACTIGALL) capsule 900 mg  900 mg Oral Q breakfast Hammons, Kimberly B, RPH   900 mg at 03/23/17 1316   And  . ursodiol (ACTIGALL) capsule 600 mg  600 mg Oral Q supper Hammons, Theone Murdoch, RPH      . [START ON 03/24/2017] vancomycin (VANCOCIN) 1,250 mg in sodium chloride 0.9 % 250 mL IVPB  1,250 mg Intravenous To OR Prescott Gum, Collier Salina, MD        Medications Prior to Admission  Medication Sig Dispense Refill Last Dose  .  aspirin EC 81 MG tablet Take 2 tablets (162 mg total) by mouth daily. 90 tablet 3 03/23/2017 at 0550  . Calcium Carbonate-Vitamin D (CALCIUM + D PO) Take 1 tablet by mouth 2 (two) times a week.    Past Week at Unknown time  . cholecalciferol (VITAMIN D) 1000 units tablet Take 2,000 Units by mouth 2 (two) times a week.    Past Week at Unknown time  . cycloSPORINE (RESTASIS) 0.05 % ophthalmic emulsion Place 1 drop into both eyes daily.   03/22/2017 at Unknown time  . diphenhydrAMINE (BENADRYL) 25 mg capsule Take 50 mg by mouth at bedtime as needed for itching.   03/19/2017  . Melatonin 3 MG TABS Take 6 mg by mouth at bedtime.   03/22/2017 at Unknown time  . Multiple Vitamin (MULTIVITAMIN WITH MINERALS) TABS Take 1 tablet by mouth 2 (two) times a week.    Past Week at Unknown time  . omeprazole (PRILOSEC) 10 MG capsule Take 20 mg by mouth daily.   03/23/2017 at Unknown time  . Probiotic Product (PROBIOTIC PO) Take 1 tablet by  mouth daily.   Past Week at Unknown time  . trimethoprim (TRIMPEX) 100 MG tablet Take 1 tablet by mouth every other day.  3 03/21/2017  . ursodiol (ACTIGALL) 300 MG capsule Take 600-900 mg by mouth 2 (two) times daily. 900 mg in am,  600 mg in pm   03/22/2017 at Unknown time  . ibuprofen (ADVIL,MOTRIN) 200 MG tablet Take 600 mg by mouth every 8 (eight) hours as needed for headache or mild pain.    Unknown at Unknown time  . lidocaine (LIDODERM) 5 % Place 1 patch onto the skin daily. Remove & Discard patch within 12 hours or as directed by MD (Patient not taking: Reported on 03/16/2017) 30 patch 0 Not Taking at Unknown time  . nitroGLYCERIN (NITROSTAT) 0.4 MG SL tablet Place 1 tablet (0.4 mg total) under the tongue every 5 (five) minutes as needed. (Patient taking differently: Place 0.4 mg under the tongue every 5 (five) minutes as needed for chest pain. ) 11 tablet 6 03/21/2017    Family History  Problem Relation Age of Onset  . CAD Father   . Diabetes Mellitus II Father      Review of Systems:   ROS Patient does not tolerate general anesthesia especially fentanyl because of severe nausea and vomiting. She is also intolerant of codeine and erythromycin.     Cardiac Review of Systems: Y or  [    ]= no  Chest Pain [   yes ]  Resting SOB [   ] Exertional SOB  [  ]  Orthopnea [  ]   Pedal Edema [   ]    Palpitations [  ] Syncope  [  ]   Presyncope [   ]  General Review of Systems: [Y] = yes [  ]=no Constitional: recent weight change [  ]; anorexia [  ]; fatigue [  ]; nausea [  yes ]; night sweats [  ]; fever [  ]; or chills [  ]                                                               Dental: poor dentition[  ];  Last Dentist visit:  6 months   Eye : blurred vision [  ]; diplopia [   ]; vision changes [  ];  Amaurosis fugax[  ]; Resp: cough [  ];  wheezing[  ];  hemoptysis[  ]; shortness of breath[  ]; paroxysmal nocturnal dyspnea[  ]; dyspnea on exertion[  ]; or orthopnea[  ];  GI:   gallstones[  ], vomiting[  ];  dysphagia[  ]; melena[  ];  hematochezia [  ]; heartburn[  ];   Hx of  Colonoscopy[  ]; history of biliary cirrhosis on medications and followed by [facility] medicine Dr. Amedeo Plenty GU: kidney stones [  ]; hematuria[  ];   dysuria [  ];  nocturia[  ];  history of     obstruction [  ]; urinary frequency [  ]             Skin: rash, swelling[  ];, hair loss[  ];  peripheral edema[  ];  or itching[  ]; Musculosketetal: myalgias[  ];  joint swelling[  ];  joint erythema[  ];  joint pain[  ];  back pain[  ];  Heme/Lymph: bruising[  ];  bleeding[  ];  anemia[  ];  Neuro: TIA[  ];  headaches[  ];  stroke[  ];  vertigo[  ];  seizures[  ];   paresthesias[  ];  difficulty walking[  ]; right-hand dominant   Psych:depression[  ]; anxiety[  ];  Endocrine: diabetes[  ];  thyroid dysfunction[  ];  Immunizations: Flu [  ]; Pneumococcal[  ];    Physical Exam: BP 122/71 (BP Location: Left Arm)   Pulse 91   Temp 98 F (36.7 C) (Oral)   Resp 20   Ht 5\' 2"  (1.575 m)   Wt 166 lb 3 oz (75.4 kg)   LMP 03/12/2017   SpO2 100%   BMI 30.40 kg/m        Physical Exam  General: Well-nourished middle-aged Caucasian female anxious but in no acute distress HEENT: Normocephalic pupils equal , dentition adequate Neck: Supple without JVD, adenopathy, or bruit Chest: Clear to auscultation, symmetrical breath sounds, no rhonchi, no tenderness             or deformity Cardiovascular: Regular rate and rhythm, no murmur, no gallop, peripheral pulses             palpable in all extremities Abdomen:  Soft, nontender, no palpable mass or organomegaly Extremities: Warm, well-perfused, no clubbing cyanosis edema or tenderness,              no venous stasis changes of the legs. No hematoma in right wrist cardiac cath site Rectal/GU: Deferred Neuro: Grossly non--focal and symmetrical throughout Skin: Clean and dry without rash or ulceration   Diagnostic Studies & Laboratory data:     Recent  Radiology Findings:   No results found.Chest x-ray images personally reviewed showing no active disease    I have independently reviewed the above radiologic studies.  Recent Lab Findings: Lab Results  Component Value Date   WBC 4.7 03/16/2017   HGB 13.4 03/16/2017   HCT 39.0 03/16/2017   PLT 235 03/16/2017   GLUCOSE 105 (H) 03/16/2017   CHOL 406 (H) 03/23/2017   TRIG 309 (H) 03/23/2017   HDL 10 (L) 03/23/2017   LDLCALC 334 (H) 03/23/2017   ALT 63 (H) 03/23/2017   AST 83 (H) 03/23/2017   NA 139 03/16/2017   K 3.9 03/16/2017  CL 101 03/16/2017   CREATININE 0.55 (L) 03/16/2017   BUN 8 03/16/2017   CO2 22 03/16/2017   INR 1.0 03/16/2017      Assessment / Plan:     Unstable angina, 95% stenosis of the LAD diagonal bifurcation Hyperlipidemia Biliary cirrhosis monitored by GI medicine for the past 10 years   patient would benefit from CABG 2 with grafts to the LAD and diagonal. We will use arterial conduit including the left IMA and left radial artery. I discussed the procedure in detail with the patient including indications benefits alternatives and risks. She understands with her underlying liver disease she is at risk for increased bleeding, further liver insufficiency postop and infection. She agrees to proceed with the surgery understanding there is a 2-4%risk of major morbidity mortality.    03/23/2017 5:23 PM

## 2017-03-23 NOTE — Plan of Care (Signed)
Asked by team to comment pre-operatively on her elevated liver enzymes with history of primary biliary cholangitis.  She has been on ursodiol.  Imaging one year ago showed some fatty liver.  Liver biopsy done 2014 showed chronic hepatitis without cirrhosis.  Endoscopy few months ago showed no portal gastropathy and no varices.   Patient's liver enzymes have increased from total bilirubin 3.8 couple months ago to 8.0 now (mix direct and indirect).  Hard to know whether this is due to medication, cardiac considerations or worsening of liver disease in face of stress of coronary disease.  It is unclear whether patient has cirrhosis based on current available information.  Should she have cirrhosis, her MELD score is 14.  Platelets and INR in acceptable range.  Patient's risk of exacerbating liver disease after cardiac surgery is moderate and flare of LFTs post-operatively wouldn't be unexpected.  However, even should we delay her cardiac surgery and she had a liver biopsy and was found to have cirrhosis, there would be very little medically we could do to improve her liver status or her liver operative risk profile in the near future outside of liver transplantation (which she would not currently be eligible for in face of severe coronary artery disease).  A RUQ abdominal ultrasound tomorrow morning to rule out highly unlikely possibility of biliary obstruction is not unreasonable.  Please call us back in the morning if we can be of any further assistance.

## 2017-03-23 NOTE — Research (Signed)
CADFEM Informed Consent   Subject Name: Christine Butler  Subject met inclusion and exclusion criteria.  The informed consent form, study requirements and expectations were reviewed with the subject and questions and concerns were addressed prior to the signing of the consent form.  The subject verbalized understanding of the trail requirements.  The subject agreed to participate in the CADFEM trial and signed the informed consent.  The informed consent was obtained prior to performance of any protocol-specific procedures for the subject.  A copy of the signed informed consent was given to the subject and a copy was placed in the subject's medical record.  [name] 03/23/2017, 06:48 AM

## 2017-03-23 NOTE — Progress Notes (Signed)
Pt had 15 beats of Vtach, denies chest pain, deies nausea and vomiting,not in respiratory distress. Pt is anxious of the upcoming procedure and requested if she can have Xanax tonight. MD on call was notified and ordered verbally to give Xanax. Will continue to monitor pt.

## 2017-03-23 NOTE — Progress Notes (Signed)
ANTICOAGULATION CONSULT NOTE - Initial Consult  Pharmacy Consult for Heparin Indication: severe LAD disease  Allergies  Allergen Reactions  . Codeine Nausea And Vomiting  . Erythromycin Nausea And Vomiting  . Fentanyl Nausea And Vomiting    Patient Measurements: Height: 5\' 2"  (157.5 cm) Weight: 162 lb (73.5 kg) IBW/kg (Calculated) : 50.1 Heparin Dosing Weight: 66kg  Vital Signs: Temp: 98.1 F (36.7 C) (01/28 0538) Temp Source: Oral (01/28 0538) BP: 116/69 (01/28 1300) Pulse Rate: 92 (01/28 1300)  Labs: No results for input(s): HGB, HCT, PLT, APTT, LABPROT, INR, HEPARINUNFRC, HEPRLOWMOCWT, CREATININE, CKTOTAL, CKMB, TROPONINI in the last 72 hours.  Estimated Creatinine Clearance: 81.7 mL/min (A) (by C-G formula based on SCr of 0.55 mg/dL (L)).   Medical History: Past Medical History:  Diagnosis Date  . Arthritis    right knee  . GERD (gastroesophageal reflux disease)   . IBS (irritable bowel syndrome)   . PONV (postoperative nausea and vomiting)   . Primary biliary cholangitis ([place])   . Primary biliary cirrhosis ([place])   . SVD (spontaneous vaginal delivery)    x 3  . Urinary tract bacterial infections    Assessment: 47yof s/p cath today found to have severe LAD stenosis not amenable to PCI. Heparin to start 6 hours after radial sheath removal with plan for surgery tomorrow. Sheath removed at 0816.   Goal of Therapy:  Heparin level 0.3-0.7 units/ml Monitor platelets by anticoagulation protocol: Yes   Plan:  1) At 1400, start heparin at 900 units/hr 2) Check 6 hour heparin level 3) CABG tomorrow 1/29  Deboraha Sprang 03/23/2017,1:43 PM

## 2017-03-23 NOTE — Progress Notes (Signed)
ANTICOAGULATION CONSULT NOTE - Initial Consult  Pharmacy Consult for Heparin Indication: severe LAD disease  Allergies  Allergen Reactions  . Codeine Nausea And Vomiting  . Erythromycin Nausea And Vomiting  . Fentanyl Nausea And Vomiting    Patient Measurements: Height: 5\' 2"  (157.5 cm) Weight: 166 lb 3 oz (75.4 kg) IBW/kg (Calculated) : 50.1 Heparin Dosing Weight: 66kg  Vital Signs: Temp: 98.5 F (36.9 C) (01/28 2025) Temp Source: Oral (01/28 2025) BP: 143/62 (01/28 2025) Pulse Rate: 85 (01/28 2025)  Labs: Recent Labs    03/23/17 1947  HEPARINUNFRC <0.10*    Estimated Creatinine Clearance: 82.6 mL/min (A) (by C-G formula based on SCr of 0.55 mg/dL (L)).  Assessment: 47yof s/p cath today found to have severe LAD stenosis not amenable to PCI. Heparin started 6 hours after radial sheath removal with plan for surgery tomorrow.   Initial lvl undetectable on 900 units/hr  Goal of Therapy:  Heparin level 0.3-0.7 units/ml Monitor platelets by anticoagulation protocol: Yes   Plan:  Bolus heparin 1500 units x 1 Increase gtt to 1100 units/hr Next lvl 0500  Levester Fresh, PharmD, BCPS, BCCCP Clinical Pharmacist Clinical phone for 03/23/2017 from 1430 [phone]: [phone] If after 2300, please call main pharmacy at: [phone] 03/23/2017 8:55 PM

## 2017-03-23 NOTE — Plan of Care (Signed)
  Progressing Cardiovascular: Ability to achieve and maintain adequate cardiovascular perfusion will improve 03/23/2017 1450 - Progressing by Alonna Buckler, RN

## 2017-03-23 NOTE — Progress Notes (Signed)
Patient, Christine Butler, admitted to room 3E03 from cath lab.  Her husband at bedside.  Patient and spouse oriented to room.  Patient denies pain.  Cardiac cath was via right wrist.  Tegaderm dressing clean, dry, and intact to cath site.  Patient instructed not to lift anything greater than 1 pound for 1 week.

## 2017-03-24 ENCOUNTER — Inpatient Hospital Stay (HOSPITAL_COMMUNITY): Payer: 59 | Admitting: Certified Registered Nurse Anesthetist

## 2017-03-24 ENCOUNTER — Encounter (HOSPITAL_COMMUNITY)
Admission: RE | Disposition: A | Discharge status: Home / Self Care | Payer: Self-pay | Source: Ambulatory Visit | Attending: Cardiothoracic Surgery

## 2017-03-24 ENCOUNTER — Inpatient Hospital Stay (HOSPITAL_COMMUNITY): Payer: 59

## 2017-03-24 ENCOUNTER — Encounter (HOSPITAL_COMMUNITY): Payer: Self-pay | Admitting: Interventional Cardiology

## 2017-03-24 DIAGNOSIS — I2510 Atherosclerotic heart disease of native coronary artery without angina pectoris: Secondary | ICD-10-CM

## 2017-03-24 DIAGNOSIS — Z951 Presence of aortocoronary bypass graft: Secondary | ICD-10-CM

## 2017-03-24 HISTORY — PX: ENDOVEIN HARVEST OF GREATER SAPHENOUS VEIN: SHX5059

## 2017-03-24 HISTORY — PX: TEE WITHOUT CARDIOVERSION: SHX5443

## 2017-03-24 HISTORY — DX: Presence of aortocoronary bypass graft: Z95.1

## 2017-03-24 HISTORY — PX: RADIAL ARTERY HARVEST: SHX5067

## 2017-03-24 HISTORY — DX: Atherosclerotic heart disease of native coronary artery without angina pectoris: I25.10

## 2017-03-24 HISTORY — PX: CORONARY ARTERY BYPASS GRAFT: SHX141

## 2017-03-24 LAB — POCT I-STAT 3, ART BLOOD GAS (G3+)
ACID-BASE DEFICIT: 3 mmol/L — AB (ref 0.0–2.0)
ACID-BASE EXCESS: 1 mmol/L (ref 0.0–2.0)
Acid-Base Excess: 2 mmol/L (ref 0.0–2.0)
Bicarbonate: 26 mmol/L (ref 20.0–28.0)
Bicarbonate: 21.8 mmol/L (ref 20.0–28.0)
Bicarbonate: 27.9 mmol/L (ref 20.0–28.0)
Bicarbonate: 25.2 mmol/L (ref 20.0–28.0)
Bicarbonate: 25.1 mmol/L (ref 20.0–28.0)
O2 SAT: 100 %
O2 SAT: 98 %
O2 SAT: 98 %
O2 Saturation: 98 %
O2 Saturation: 99 %
PCO2 ART: 40.6 mmHg (ref 32.0–48.0)
PCO2 ART: 45.4 mmHg (ref 32.0–48.0)
PH ART: 7.395 (ref 7.350–7.450)
PO2 ART: 368 mmHg — AB (ref 83.0–108.0)
PO2 ART: 110 mmHg — AB (ref 83.0–108.0)
Patient temperature: 36.9
Patient temperature: 37.5
TCO2: 27 mmol/L (ref 22–32)
TCO2: 23 mmol/L (ref 22–32)
TCO2: 29 mmol/L (ref 22–32)
TCO2: 26 mmol/L (ref 22–32)
TCO2: 26 mmol/L (ref 22–32)
pCO2 arterial: 38.1 mmHg (ref 32.0–48.0)
pCO2 arterial: 41.2 mmHg (ref 32.0–48.0)
pCO2 arterial: 42 mmHg (ref 32.0–48.0)
pH, Arterial: 7.415 (ref 7.350–7.450)
pH, Arterial: 7.365 (ref 7.350–7.450)
pH, Arterial: 7.394 (ref 7.350–7.450)
pH, Arterial: 7.387 (ref 7.350–7.450)
pO2, Arterial: 109 mmHg — ABNORMAL HIGH (ref 83.0–108.0)
pO2, Arterial: 128 mmHg — ABNORMAL HIGH (ref 83.0–108.0)
pO2, Arterial: 106 mmHg (ref 83.0–108.0)

## 2017-03-24 LAB — POCT I-STAT, CHEM 8
BUN: 5 mg/dL — ABNORMAL LOW (ref 6–20)
BUN: 4 mg/dL — AB (ref 6–20)
BUN: 5 mg/dL — ABNORMAL LOW (ref 6–20)
BUN: 5 mg/dL — ABNORMAL LOW (ref 6–20)
BUN: 5 mg/dL — AB (ref 6–20)
BUN: 6 mg/dL (ref 6–20)
CALCIUM ION: 1.15 mmol/L (ref 1.15–1.40)
CALCIUM ION: 1.06 mmol/L — AB (ref 1.15–1.40)
CALCIUM ION: 1.4 mmol/L (ref 1.15–1.40)
CHLORIDE: 101 mmol/L (ref 101–111)
CREATININE: 0.3 mg/dL — AB (ref 0.44–1.00)
CREATININE: 0.3 mg/dL — AB (ref 0.44–1.00)
Calcium, Ion: 1.02 mmol/L — ABNORMAL LOW (ref 1.15–1.40)
Calcium, Ion: 1.17 mmol/L (ref 1.15–1.40)
Calcium, Ion: 1.22 mmol/L (ref 1.15–1.40)
Chloride: 104 mmol/L (ref 101–111)
Chloride: 105 mmol/L (ref 101–111)
Chloride: 103 mmol/L (ref 101–111)
Chloride: 106 mmol/L (ref 101–111)
Chloride: 108 mmol/L (ref 101–111)
Creatinine, Ser: 0.2 mg/dL — ABNORMAL LOW (ref 0.44–1.00)
Creatinine, Ser: 0.3 mg/dL — ABNORMAL LOW (ref 0.44–1.00)
Creatinine, Ser: 0.4 mg/dL — ABNORMAL LOW (ref 0.44–1.00)
GLUCOSE: 122 mg/dL — AB (ref 65–99)
GLUCOSE: 171 mg/dL — AB (ref 65–99)
Glucose, Bld: 102 mg/dL — ABNORMAL HIGH (ref 65–99)
Glucose, Bld: 127 mg/dL — ABNORMAL HIGH (ref 65–99)
Glucose, Bld: 129 mg/dL — ABNORMAL HIGH (ref 65–99)
Glucose, Bld: 106 mg/dL — ABNORMAL HIGH (ref 65–99)
HCT: 30 % — ABNORMAL LOW (ref 36.0–46.0)
HCT: 24 % — ABNORMAL LOW (ref 36.0–46.0)
HCT: 25 % — ABNORMAL LOW (ref 36.0–46.0)
HCT: 28 % — ABNORMAL LOW (ref 36.0–46.0)
HEMATOCRIT: 28 % — AB (ref 36.0–46.0)
HEMATOCRIT: 29 % — AB (ref 36.0–46.0)
HEMOGLOBIN: 9.5 g/dL — AB (ref 12.0–15.0)
HEMOGLOBIN: 8.5 g/dL — AB (ref 12.0–15.0)
HEMOGLOBIN: 9.5 g/dL — AB (ref 12.0–15.0)
HEMOGLOBIN: 9.9 g/dL — AB (ref 12.0–15.0)
Hemoglobin: 10.2 g/dL — ABNORMAL LOW (ref 12.0–15.0)
Hemoglobin: 8.2 g/dL — ABNORMAL LOW (ref 12.0–15.0)
Potassium: 3.3 mmol/L — ABNORMAL LOW (ref 3.5–5.1)
Potassium: 3.2 mmol/L — ABNORMAL LOW (ref 3.5–5.1)
Potassium: 3.3 mmol/L — ABNORMAL LOW (ref 3.5–5.1)
Potassium: 3.8 mmol/L (ref 3.5–5.1)
Potassium: 3.5 mmol/L (ref 3.5–5.1)
Potassium: 4.2 mmol/L (ref 3.5–5.1)
SODIUM: 141 mmol/L (ref 135–145)
SODIUM: 141 mmol/L (ref 135–145)
SODIUM: 141 mmol/L (ref 135–145)
Sodium: 141 mmol/L (ref 135–145)
Sodium: 142 mmol/L (ref 135–145)
Sodium: 141 mmol/L (ref 135–145)
TCO2: 29 mmol/L (ref 22–32)
TCO2: 26 mmol/L (ref 22–32)
TCO2: 28 mmol/L (ref 22–32)
TCO2: 26 mmol/L (ref 22–32)
TCO2: 22 mmol/L (ref 22–32)
TCO2: 23 mmol/L (ref 22–32)

## 2017-03-24 LAB — CBC
HCT: 33.8 % — ABNORMAL LOW (ref 36.0–46.0)
HCT: 28.9 % — ABNORMAL LOW (ref 36.0–46.0)
HCT: 29.1 % — ABNORMAL LOW (ref 36.0–46.0)
Hemoglobin: 11.2 g/dL — ABNORMAL LOW (ref 12.0–15.0)
Hemoglobin: 9.7 g/dL — ABNORMAL LOW (ref 12.0–15.0)
Hemoglobin: 9.7 g/dL — ABNORMAL LOW (ref 12.0–15.0)
MCH: 32.2 pg (ref 26.0–34.0)
MCH: 32.6 pg (ref 26.0–34.0)
MCH: 32.4 pg (ref 26.0–34.0)
MCHC: 33.1 g/dL (ref 30.0–36.0)
MCHC: 33.6 g/dL (ref 30.0–36.0)
MCHC: 33.3 g/dL (ref 30.0–36.0)
MCV: 97.1 fL (ref 78.0–100.0)
MCV: 97 fL (ref 78.0–100.0)
MCV: 97.3 fL (ref 78.0–100.0)
PLATELETS: 164 10*3/uL (ref 150–400)
Platelets: 176 10*3/uL (ref 150–400)
Platelets: 220 10*3/uL (ref 150–400)
RBC: 3.48 MIL/uL — ABNORMAL LOW (ref 3.87–5.11)
RBC: 2.98 MIL/uL — AB (ref 3.87–5.11)
RBC: 2.99 MIL/uL — ABNORMAL LOW (ref 3.87–5.11)
RDW: 15.3 % (ref 11.5–15.5)
RDW: 15.5 % (ref 11.5–15.5)
RDW: 15.1 % (ref 11.5–15.5)
WBC: 4.6 10*3/uL (ref 4.0–10.5)
WBC: 12 10*3/uL — ABNORMAL HIGH (ref 4.0–10.5)
WBC: 13.7 10*3/uL — ABNORMAL HIGH (ref 4.0–10.5)

## 2017-03-24 LAB — BASIC METABOLIC PANEL
Anion gap: 12 (ref 5–15)
BUN: 6 mg/dL (ref 6–20)
CO2: 21 mmol/L — ABNORMAL LOW (ref 22–32)
Calcium: 8.5 mg/dL — ABNORMAL LOW (ref 8.9–10.3)
Chloride: 105 mmol/L (ref 101–111)
Creatinine, Ser: 0.43 mg/dL — ABNORMAL LOW (ref 0.44–1.00)
GFR calc Af Amer: >60 mL/min (ref >60)
GFR calc non Af Amer: >60 mL/min (ref >60)
Glucose, Bld: 107 mg/dL — ABNORMAL HIGH (ref 65–99)
Potassium: 3.4 mmol/L — ABNORMAL LOW (ref 3.5–5.1)
Sodium: 138 mmol/L (ref 135–145)

## 2017-03-24 LAB — SURGICAL PCR SCREEN
MRSA, PCR: NEGATIVE
Staphylococcus aureus: POSITIVE — AB

## 2017-03-24 LAB — GLUCOSE, CAPILLARY: Glucose-Capillary: 93 mg/dL (ref 65–99)

## 2017-03-24 LAB — PROTIME-INR
INR: 1.32
PROTHROMBIN TIME: 16.3 s — AB (ref 11.4–15.2)

## 2017-03-24 LAB — POCT I-STAT 4, (NA,K, GLUC, HGB,HCT)
Glucose, Bld: 95 mg/dL (ref 65–99)
HEMATOCRIT: 30 % — AB (ref 36.0–46.0)
Hemoglobin: 10.2 g/dL — ABNORMAL LOW (ref 12.0–15.0)
Potassium: 3.6 mmol/L (ref 3.5–5.1)
SODIUM: 143 mmol/L (ref 135–145)

## 2017-03-24 LAB — HEPARIN LEVEL (UNFRACTIONATED): HEPARIN UNFRACTIONATED: 0.13 [IU]/mL — AB (ref 0.30–0.70)

## 2017-03-24 LAB — PLATELET COUNT: Platelets: 235 10*3/uL (ref 150–400)

## 2017-03-24 LAB — HEMOGLOBIN AND HEMATOCRIT, BLOOD
HCT: 24 % — ABNORMAL LOW (ref 36.0–46.0)
Hemoglobin: 8 g/dL — ABNORMAL LOW (ref 12.0–15.0)

## 2017-03-24 LAB — MAGNESIUM: Magnesium: 2.7 mg/dL — ABNORMAL HIGH (ref 1.7–2.4)

## 2017-03-24 LAB — FIBRINOGEN: Fibrinogen: 260 mg/dL (ref 210–475)

## 2017-03-24 LAB — CREATININE, SERUM
Creatinine, Ser: 0.54 mg/dL (ref 0.44–1.00)
GFR calc Af Amer: >60 mL/min (ref >60)
GFR calc non Af Amer: >60 mL/min (ref >60)

## 2017-03-24 LAB — APTT: aPTT: 31 seconds (ref 24–36)

## 2017-03-24 LAB — ABO/RH: ABO/RH(D): A POS

## 2017-03-24 LAB — PREPARE RBC (CROSSMATCH)

## 2017-03-24 SURGERY — CORONARY ARTERY BYPASS GRAFTING (CABG)
Anesthesia: General | Site: Leg Upper | Laterality: Right

## 2017-03-24 MED ORDER — OXYCODONE HCL 5 MG PO TABS: 5.0000 mg | ORAL_TABLET | ORAL | Status: DC | PRN

## 2017-03-24 MED ORDER — SODIUM CHLORIDE 0.9 % IV SOLN: 250.0000 mL | INTRAVENOUS | Status: DC

## 2017-03-24 MED ORDER — HEMOSTATIC AGENTS (NO CHARGE) OPTIME
TOPICAL | Status: DC | PRN
  Administered 2017-03-24 (×2): 1 via TOPICAL

## 2017-03-24 MED ORDER — MIDAZOLAM HCL 2 MG/2ML IJ SOLN: 2.0000 mg | INTRAMUSCULAR | Status: DC | PRN

## 2017-03-24 MED ORDER — MORPHINE SULFATE (PF) 2 MG/ML IV SOLN: 2.0000 mg | INTRAVENOUS | Status: DC | PRN

## 2017-03-24 MED ORDER — ASPIRIN EC 325 MG PO TBEC
325.0000 mg | DELAYED_RELEASE_TABLET | Freq: Every day | ORAL | Status: DC
  Administered 2017-03-25 – 2017-04-03 (×10): 325 mg via ORAL
  Filled 2017-03-24 (×10): qty 1

## 2017-03-24 MED ORDER — METOCLOPRAMIDE HCL 5 MG/ML IJ SOLN
INTRAMUSCULAR | Status: AC
  Filled 2017-03-24: qty 2

## 2017-03-24 MED ORDER — ALBUMIN HUMAN 5 % IV SOLN
250.0000 mL | INTRAVENOUS | Status: AC | PRN
  Administered 2017-03-24 (×3): 250 mL via INTRAVENOUS
  Filled 2017-03-24: qty 250

## 2017-03-24 MED ORDER — FENTANYL CITRATE (PF) 250 MCG/5ML IJ SOLN
INTRAMUSCULAR | Status: AC
  Filled 2017-03-24: qty 5

## 2017-03-24 MED ORDER — DEXTROSE 5 % IV SOLN
1.5000 g | Freq: Two times a day (BID) | INTRAVENOUS | Status: AC
  Administered 2017-03-24 – 2017-03-26 (×4): 1.5 g via INTRAVENOUS
  Filled 2017-03-24 (×4): qty 1.5

## 2017-03-24 MED ORDER — SODIUM CHLORIDE 0.9 % IV SOLN
INTRAVENOUS | Status: DC | PRN
  Administered 2017-03-24: .8 [IU]/h via INTRAVENOUS

## 2017-03-24 MED ORDER — ORAL CARE MOUTH RINSE
15.0000 mL | Freq: Four times a day (QID) | OROMUCOSAL | Status: DC
  Administered 2017-03-24 – 2017-03-25 (×4): 15 mL via OROMUCOSAL

## 2017-03-24 MED ORDER — SODIUM CHLORIDE 0.45 % IV SOLN
INTRAVENOUS | Status: DC | PRN
  Administered 2017-03-24: 14:00:00 via INTRAVENOUS

## 2017-03-24 MED ORDER — HEMOSTATIC AGENTS (NO CHARGE) OPTIME
TOPICAL | Status: DC | PRN
  Administered 2017-03-24 (×3): 1 via TOPICAL

## 2017-03-24 MED ORDER — 0.9 % SODIUM CHLORIDE (POUR BTL) OPTIME
TOPICAL | Status: DC | PRN
  Administered 2017-03-24: 5000 mL

## 2017-03-24 MED ORDER — ASPIRIN 81 MG PO CHEW
324.0000 mg | CHEWABLE_TABLET | Freq: Every day | ORAL | Status: DC
  Filled 2017-03-24: qty 4

## 2017-03-24 MED ORDER — LACTATED RINGERS IV SOLN
INTRAVENOUS | Status: DC | PRN
  Administered 2017-03-24: 07:00:00 via INTRAVENOUS

## 2017-03-24 MED ORDER — MORPHINE SULFATE (PF) 4 MG/ML IV SOLN
2.0000 mg | INTRAVENOUS | Status: DC | PRN
  Administered 2017-03-24 – 2017-03-26 (×6): 2 mg via INTRAVENOUS
  Administered 2017-03-27: 4 mg via INTRAVENOUS
  Administered 2017-03-27: 2 mg via INTRAVENOUS
  Administered 2017-03-28 (×3): 4 mg via INTRAVENOUS
  Administered 2017-03-29: 2 mg via INTRAVENOUS
  Administered 2017-03-29: 3 mg via INTRAVENOUS
  Administered 2017-03-29: 5 mg via INTRAVENOUS
  Administered 2017-03-30 – 2017-03-31 (×3): 4 mg via INTRAVENOUS
  Filled 2017-03-24 (×9): qty 1
  Filled 2017-03-24: qty 2
  Filled 2017-03-24 (×7): qty 1

## 2017-03-24 MED ORDER — PROMETHAZINE HCL 25 MG/ML IJ SOLN: 12.5000 mg | Freq: Four times a day (QID) | INTRAMUSCULAR | Status: DC | PRN

## 2017-03-24 MED ORDER — MELATONIN 3 MG PO TABS
6.0000 mg | ORAL_TABLET | Freq: Every day | ORAL | Status: DC
Start: 2017-03-25 — End: 2017-04-03
  Administered 2017-03-26 – 2017-04-01 (×7): 6 mg via ORAL
  Filled 2017-03-24 (×11): qty 2

## 2017-03-24 MED ORDER — PHENYLEPHRINE 40 MCG/ML (10ML) SYRINGE FOR IV PUSH (FOR BLOOD PRESSURE SUPPORT)
PREFILLED_SYRINGE | INTRAVENOUS | Status: AC
  Filled 2017-03-24: qty 20

## 2017-03-24 MED ORDER — PANTOPRAZOLE SODIUM 40 MG PO TBEC
40.0000 mg | DELAYED_RELEASE_TABLET | Freq: Every day | ORAL | Status: DC
  Administered 2017-03-26 – 2017-04-03 (×9): 40 mg via ORAL
  Filled 2017-03-24 (×9): qty 1

## 2017-03-24 MED ORDER — SODIUM CHLORIDE 0.9 % IV SOLN
INTRAVENOUS | Status: DC | PRN
  Administered 2017-03-24: 13:00:00 via INTRAVENOUS

## 2017-03-24 MED ORDER — PHENYLEPHRINE HCL 10 MG/ML IJ SOLN
INTRAVENOUS | Status: DC | PRN
  Administered 2017-03-24: 20 ug/min via INTRAVENOUS

## 2017-03-24 MED ORDER — TRAMADOL HCL 50 MG PO TABS: 50.0000 mg | ORAL_TABLET | ORAL | Status: DC | PRN

## 2017-03-24 MED ORDER — MUPIROCIN 2 % EX OINT
1.0000 "application " | TOPICAL_OINTMENT | Freq: Two times a day (BID) | CUTANEOUS | Status: DC
  Administered 2017-03-24: 1 via NASAL
  Filled 2017-03-24: qty 22

## 2017-03-24 MED ORDER — ACETAMINOPHEN 160 MG/5ML PO SOLN: 1000.0000 mg | Freq: Four times a day (QID) | ORAL | Status: DC

## 2017-03-24 MED ORDER — SODIUM CHLORIDE 0.9 % IV SOLN
0.0000 ug/min | INTRAVENOUS | Status: DC
  Administered 2017-03-24 – 2017-03-25 (×2): 65 ug/min via INTRAVENOUS
  Administered 2017-03-25: 50 ug/min via INTRAVENOUS
  Administered 2017-03-25: 70 ug/min via INTRAVENOUS
  Administered 2017-03-25: 40 ug/min via INTRAVENOUS
  Filled 2017-03-24 (×5): qty 2

## 2017-03-24 MED ORDER — PROPOFOL 10 MG/ML IV BOLUS
INTRAVENOUS | Status: AC
  Filled 2017-03-24: qty 20

## 2017-03-24 MED ORDER — SODIUM CHLORIDE 0.9% FLUSH
3.0000 mL | Freq: Two times a day (BID) | INTRAVENOUS | Status: DC
  Administered 2017-03-25 – 2017-03-30 (×7): 3 mL via INTRAVENOUS

## 2017-03-24 MED ORDER — NOREPINEPHRINE BITARTRATE 1 MG/ML IV SOLN: 0.0000 ug/min | INTRAVENOUS | Status: DC

## 2017-03-24 MED ORDER — MIDAZOLAM HCL 10 MG/2ML IJ SOLN
INTRAMUSCULAR | Status: AC
  Filled 2017-03-24: qty 2

## 2017-03-24 MED ORDER — INSULIN ASPART 100 UNIT/ML ~~LOC~~ SOLN: 0.0000 [IU] | SUBCUTANEOUS | Status: DC

## 2017-03-24 MED ORDER — VANCOMYCIN HCL IN DEXTROSE 1-5 GM/200ML-% IV SOLN
1000.0000 mg | Freq: Once | INTRAVENOUS | Status: AC
  Administered 2017-03-24: 1000 mg via INTRAVENOUS
  Filled 2017-03-24: qty 200

## 2017-03-24 MED ORDER — DOCUSATE SODIUM 100 MG PO CAPS
200.0000 mg | ORAL_CAPSULE | Freq: Every day | ORAL | Status: DC
  Administered 2017-03-25 – 2017-04-03 (×10): 200 mg via ORAL
  Filled 2017-03-24 (×10): qty 2

## 2017-03-24 MED ORDER — DEXMEDETOMIDINE HCL IN NACL 400 MCG/100ML IV SOLN
INTRAVENOUS | Status: DC | PRN
  Administered 2017-03-24: .3 ug/kg/h via INTRAVENOUS

## 2017-03-24 MED ORDER — CHLORHEXIDINE GLUCONATE CLOTH 2 % EX PADS: 6.0000 | MEDICATED_PAD | Freq: Every day | CUTANEOUS | Status: DC

## 2017-03-24 MED ORDER — MILRINONE LACTATE IN DEXTROSE 20-5 MG/100ML-% IV SOLN
0.0625 ug/kg/min | INTRAVENOUS | Status: DC
  Administered 2017-03-25: 0.063 ug/kg/min via INTRAVENOUS
  Filled 2017-03-24: qty 100

## 2017-03-24 MED ORDER — PROTAMINE SULFATE 10 MG/ML IV SOLN
INTRAVENOUS | Status: AC
  Filled 2017-03-24: qty 25

## 2017-03-24 MED ORDER — MIDAZOLAM HCL 2 MG/2ML IJ SOLN
INTRAMUSCULAR | Status: AC
  Filled 2017-03-24: qty 2

## 2017-03-24 MED ORDER — MORPHINE SULFATE (PF) 4 MG/ML IV SOLN: 1.0000 mg | INTRAVENOUS | Status: AC | PRN

## 2017-03-24 MED ORDER — HEPARIN SODIUM (PORCINE) 1000 UNIT/ML IJ SOLN
INTRAMUSCULAR | Status: AC
  Filled 2017-03-24: qty 1

## 2017-03-24 MED ORDER — SODIUM CHLORIDE 0.9 % IV SOLN
INTRAVENOUS | Status: DC
  Administered 2017-03-24: 14:00:00 via INTRAVENOUS

## 2017-03-24 MED ORDER — PHENYLEPHRINE HCL 10 MG/ML IJ SOLN
INTRAMUSCULAR | Status: DC | PRN
  Administered 2017-03-24 (×2): 80 ug via INTRAVENOUS
  Administered 2017-03-24 (×2): 120 ug via INTRAVENOUS
  Administered 2017-03-24: 40 ug via INTRAVENOUS
  Administered 2017-03-24: 80 ug via INTRAVENOUS
  Administered 2017-03-24: 40 ug via INTRAVENOUS
  Administered 2017-03-24: 80 ug via INTRAVENOUS

## 2017-03-24 MED ORDER — SODIUM CHLORIDE 0.9 % IV SOLN
0.0000 ug/kg/h | INTRAVENOUS | Status: DC
  Administered 2017-03-24: 0.2 ug/kg/h via INTRAVENOUS
  Filled 2017-03-24: qty 2

## 2017-03-24 MED ORDER — LACTATED RINGERS IV SOLN: INTRAVENOUS | Status: DC

## 2017-03-24 MED ORDER — LACTATED RINGERS IV SOLN: 500.0000 mL | Freq: Once | INTRAVENOUS | Status: DC | PRN

## 2017-03-24 MED ORDER — PROPOFOL 10 MG/ML IV BOLUS
INTRAVENOUS | Status: DC | PRN
  Administered 2017-03-24: 30 mg via INTRAVENOUS
  Administered 2017-03-24: 50 mg via INTRAVENOUS
  Administered 2017-03-24: 30 mg via INTRAVENOUS
  Administered 2017-03-24: 60 mg via INTRAVENOUS
  Administered 2017-03-24: 30 mg via INTRAVENOUS

## 2017-03-24 MED ORDER — MUPIROCIN 2 % EX OINT
TOPICAL_OINTMENT | Freq: Two times a day (BID) | CUTANEOUS | Status: DC
  Administered 2017-03-24 – 2017-03-31 (×14): via NASAL
  Administered 2017-03-31: 1 via NASAL
  Administered 2017-04-01 – 2017-04-03 (×5): via NASAL
  Filled 2017-03-24 (×2): qty 22

## 2017-03-24 MED ORDER — CHLORHEXIDINE GLUCONATE 0.12 % MT SOLN
15.0000 mL | OROMUCOSAL | Status: AC
  Administered 2017-03-24: 15 mL via OROMUCOSAL

## 2017-03-24 MED ORDER — ACETAMINOPHEN 160 MG/5ML PO SOLN: 650.0000 mg | Freq: Once | ORAL | Status: AC

## 2017-03-24 MED ORDER — SODIUM CHLORIDE 0.9 % IV SOLN
INTRAVENOUS | Status: DC
  Filled 2017-03-24: qty 1

## 2017-03-24 MED ORDER — METOPROLOL TARTRATE 25 MG/10 ML ORAL SUSPENSION
12.5000 mg | Freq: Two times a day (BID) | ORAL | Status: DC
  Filled 2017-03-24 (×2): qty 10

## 2017-03-24 MED ORDER — MIDAZOLAM HCL 5 MG/5ML IJ SOLN
INTRAMUSCULAR | Status: DC | PRN
  Administered 2017-03-24 (×2): 1 mg via INTRAVENOUS
  Administered 2017-03-24 (×2): 2 mg via INTRAVENOUS
  Administered 2017-03-24: 3 mg via INTRAVENOUS
  Administered 2017-03-24: 1 mg via INTRAVENOUS

## 2017-03-24 MED ORDER — POTASSIUM CHLORIDE 10 MEQ/50ML IV SOLN
10.0000 meq | INTRAVENOUS | Status: AC
  Administered 2017-03-24 (×3): 10 meq via INTRAVENOUS

## 2017-03-24 MED ORDER — METOPROLOL TARTRATE 12.5 MG HALF TABLET
12.5000 mg | ORAL_TABLET | Freq: Two times a day (BID) | ORAL | Status: DC
  Administered 2017-03-25 – 2017-03-30 (×10): 12.5 mg via ORAL
  Filled 2017-03-24 (×12): qty 1

## 2017-03-24 MED ORDER — ROCURONIUM BROMIDE 100 MG/10ML IV SOLN
INTRAVENOUS | Status: DC | PRN
  Administered 2017-03-24: 30 mg via INTRAVENOUS
  Administered 2017-03-24: 60 mg via INTRAVENOUS
  Administered 2017-03-24: 40 mg via INTRAVENOUS
  Administered 2017-03-24: 50 mg via INTRAVENOUS

## 2017-03-24 MED ORDER — PROMETHAZINE HCL 25 MG/ML IJ SOLN
6.2500 mg | Freq: Four times a day (QID) | INTRAMUSCULAR | Status: DC | PRN
  Administered 2017-03-24 – 2017-03-25 (×3): 6.25 mg via INTRAVENOUS
  Filled 2017-03-24 (×3): qty 1

## 2017-03-24 MED ORDER — NOREPINEPHRINE BITARTRATE 1 MG/ML IV SOLN
0.0000 ug/min | INTRAVENOUS | Status: DC
  Administered 2017-03-24: 5 ug/min via INTRAVENOUS
  Filled 2017-03-24: qty 4

## 2017-03-24 MED ORDER — PROTAMINE SULFATE 10 MG/ML IV SOLN
INTRAVENOUS | Status: DC | PRN
Start: 2017-03-24 — End: 2017-03-24

## 2017-03-24 MED ORDER — SODIUM CHLORIDE 0.9% FLUSH: 3.0000 mL | INTRAVENOUS | Status: DC | PRN

## 2017-03-24 MED ORDER — ISOSORBIDE MONONITRATE ER 30 MG PO TB24
30.0000 mg | ORAL_TABLET | Freq: Every day | ORAL | Status: DC
  Filled 2017-03-24: qty 1

## 2017-03-24 MED ORDER — ACETAMINOPHEN 650 MG RE SUPP
650.0000 mg | Freq: Once | RECTAL | Status: AC
  Administered 2017-03-24: 650 mg via RECTAL

## 2017-03-24 MED ORDER — METOCLOPRAMIDE HCL 5 MG/ML IJ SOLN
INTRAMUSCULAR | Status: DC | PRN
  Administered 2017-03-24: 10 mg via INTRAVENOUS

## 2017-03-24 MED ORDER — FAMOTIDINE IN NACL 20-0.9 MG/50ML-% IV SOLN
20.0000 mg | Freq: Two times a day (BID) | INTRAVENOUS | Status: DC
  Administered 2017-03-24: 20 mg via INTRAVENOUS

## 2017-03-24 MED ORDER — FENTANYL CITRATE (PF) 100 MCG/2ML IJ SOLN
INTRAMUSCULAR | Status: DC | PRN
  Administered 2017-03-24: 150 ug via INTRAVENOUS
  Administered 2017-03-24 (×3): 100 ug via INTRAVENOUS
  Administered 2017-03-24 (×2): 150 ug via INTRAVENOUS
  Administered 2017-03-24: 100 ug via INTRAVENOUS
  Administered 2017-03-24: 50 ug via INTRAVENOUS
  Administered 2017-03-24: 400 ug via INTRAVENOUS
  Administered 2017-03-24: 150 ug via INTRAVENOUS
  Administered 2017-03-24: 100 ug via INTRAVENOUS
  Administered 2017-03-24: 50 ug via INTRAVENOUS

## 2017-03-24 MED ORDER — MAGNESIUM SULFATE 4 GM/100ML IV SOLN
4.0000 g | Freq: Once | INTRAVENOUS | Status: AC
  Administered 2017-03-24: 4 g via INTRAVENOUS
  Filled 2017-03-24: qty 100

## 2017-03-24 MED ORDER — MICROFIBRILLAR COLL HEMOSTAT EX PADS
MEDICATED_PAD | CUTANEOUS | Status: DC | PRN
  Administered 2017-03-24: 1 via TOPICAL

## 2017-03-24 MED ORDER — LIDOCAINE HCL (CARDIAC) 20 MG/ML IV SOLN
INTRAVENOUS | Status: DC | PRN
  Administered 2017-03-24: 100 mg via INTRAVENOUS

## 2017-03-24 MED ORDER — EPHEDRINE SULFATE 50 MG/ML IJ SOLN
INTRAMUSCULAR | Status: DC | PRN
  Administered 2017-03-24: 2.5 mg via INTRAVENOUS
  Administered 2017-03-24: 5 mg via INTRAVENOUS
  Administered 2017-03-24: 10 mg via INTRAVENOUS
  Administered 2017-03-24: 2.5 mg via INTRAVENOUS
  Administered 2017-03-24: 5 mg via INTRAVENOUS
  Administered 2017-03-24 (×2): 10 mg via INTRAVENOUS

## 2017-03-24 MED ORDER — ROCURONIUM BROMIDE 10 MG/ML (PF) SYRINGE
PREFILLED_SYRINGE | INTRAVENOUS | Status: AC
  Filled 2017-03-24: qty 10

## 2017-03-24 MED ORDER — MIDAZOLAM HCL 2 MG/2ML IJ SOLN: 1.0000 mg | INTRAMUSCULAR | Status: DC | PRN

## 2017-03-24 MED ORDER — INSULIN REGULAR BOLUS VIA INFUSION
0.0000 [IU] | Freq: Three times a day (TID) | INTRAVENOUS | Status: DC
  Filled 2017-03-24: qty 10

## 2017-03-24 MED ORDER — METOCLOPRAMIDE HCL 5 MG/ML IJ SOLN
10.0000 mg | Freq: Four times a day (QID) | INTRAMUSCULAR | Status: AC
  Administered 2017-03-24 – 2017-03-29 (×19): 10 mg via INTRAVENOUS
  Filled 2017-03-24 (×18): qty 2

## 2017-03-24 MED ORDER — METOPROLOL TARTRATE 5 MG/5ML IV SOLN: 2.5000 mg | INTRAVENOUS | Status: DC | PRN

## 2017-03-24 MED ORDER — BISACODYL 5 MG PO TBEC
10.0000 mg | DELAYED_RELEASE_TABLET | Freq: Every day | ORAL | Status: DC
  Administered 2017-03-25 – 2017-03-30 (×6): 10 mg via ORAL
  Filled 2017-03-24 (×7): qty 2

## 2017-03-24 MED ORDER — ESMOLOL HCL 100 MG/10ML IV SOLN
INTRAVENOUS | Status: DC | PRN
  Administered 2017-03-24 (×4): 20 mg via INTRAVENOUS

## 2017-03-24 MED ORDER — EPHEDRINE 5 MG/ML INJ
INTRAVENOUS | Status: AC
  Filled 2017-03-24: qty 20

## 2017-03-24 MED ORDER — HEPARIN SODIUM (PORCINE) 1000 UNIT/ML IJ SOLN
INTRAMUSCULAR | Status: DC | PRN
  Administered 2017-03-24: 23000 [IU] via INTRAVENOUS
  Administered 2017-03-24: 2000 [IU] via INTRAVENOUS

## 2017-03-24 MED ORDER — ONDANSETRON HCL 4 MG/2ML IJ SOLN
4.0000 mg | Freq: Four times a day (QID) | INTRAMUSCULAR | Status: DC | PRN
  Administered 2017-03-24 – 2017-03-30 (×9): 4 mg via INTRAVENOUS
  Filled 2017-03-24 (×9): qty 2

## 2017-03-24 MED ORDER — FENTANYL CITRATE (PF) 250 MCG/5ML IJ SOLN
INTRAMUSCULAR | Status: AC
Start: 2017-03-24 — End: 2017-03-24
  Filled 2017-03-24: qty 25

## 2017-03-24 MED ORDER — NITROGLYCERIN IN D5W 200-5 MCG/ML-% IV SOLN: 0.0000 ug/min | INTRAVENOUS | Status: DC

## 2017-03-24 MED ORDER — ARTIFICIAL TEARS OPHTHALMIC OINT
TOPICAL_OINTMENT | OPHTHALMIC | Status: DC | PRN
  Administered 2017-03-24: 1 via OPHTHALMIC

## 2017-03-24 MED ORDER — BISACODYL 10 MG RE SUPP: 10.0000 mg | Freq: Every day | RECTAL | Status: DC

## 2017-03-24 MED ORDER — MORPHINE SULFATE (PF) 2 MG/ML IV SOLN: 1.0000 mg | INTRAVENOUS | Status: DC | PRN

## 2017-03-24 MED ORDER — SODIUM CHLORIDE 0.9 % IV SOLN
20.0000 ug | INTRAVENOUS | Status: AC
  Administered 2017-03-24: 20 ug via INTRAVENOUS
  Filled 2017-03-24: qty 5

## 2017-03-24 MED ORDER — CHLORHEXIDINE GLUCONATE 0.12% ORAL RINSE (MEDLINE KIT)
15.0000 mL | Freq: Two times a day (BID) | OROMUCOSAL | Status: DC
  Administered 2017-03-24 – 2017-03-26 (×3): 15 mL via OROMUCOSAL

## 2017-03-24 MED ORDER — NITROGLYCERIN IN D5W 200-5 MCG/ML-% IV SOLN: 7.0000 ug/min | INTRAVENOUS | Status: DC

## 2017-03-24 MED ORDER — ACETAMINOPHEN 500 MG PO TABS: 1000.0000 mg | ORAL_TABLET | Freq: Four times a day (QID) | ORAL | Status: DC

## 2017-03-24 SURGICAL SUPPLY — 123 items
ADAPTER CARDIO PERF ANTE/RETRO (ADAPTER) ×6 IMPLANT
ADH SKN CLS APL DERMABOND .7 (GAUZE/BANDAGES/DRESSINGS) ×4
ADPR PRFSN 84XANTGRD RTRGD (ADAPTER) ×4
APPLIER CLIP 9.375 SM OPEN (CLIP)
APR CLP SM 9.3 20 MLT OPN (CLIP)
BAG DECANTER FOR FLEXI CONT (MISCELLANEOUS) ×6 IMPLANT
BANDAGE ACE 4X5 VEL STRL LF (GAUZE/BANDAGES/DRESSINGS) ×12 IMPLANT
BANDAGE ACE 6X5 VEL STRL LF (GAUZE/BANDAGES/DRESSINGS) ×6 IMPLANT
BASKET HEART  (ORDER IN 25'S) (MISCELLANEOUS) ×1
BASKET HEART (ORDER IN 25'S) (MISCELLANEOUS) ×1
BASKET HEART (ORDER IN 25S) (MISCELLANEOUS) ×4 IMPLANT
BINDER BREAST LRG (GAUZE/BANDAGES/DRESSINGS) ×6 IMPLANT
BLADE 11 SAFETY STRL DISP (BLADE) ×6 IMPLANT
BLADE CLIPPER SURG (BLADE) IMPLANT
BLADE STERNUM SYSTEM 6 (BLADE) ×6 IMPLANT
BLADE SURG 12 STRL SS (BLADE) ×6 IMPLANT
BLADE SURG 15 STRL LF DISP TIS (BLADE) ×4 IMPLANT
BLADE SURG 15 STRL SS (BLADE) ×6
BNDG GAUZE ELAST 4 BULKY (GAUZE/BANDAGES/DRESSINGS) ×12 IMPLANT
CANISTER SUCT 3000ML PPV (MISCELLANEOUS) ×6 IMPLANT
CANNULA GUNDRY RCSP 15FR (MISCELLANEOUS) ×6 IMPLANT
CATH CPB KIT VANTRIGT (MISCELLANEOUS) ×6 IMPLANT
CATH ROBINSON RED A/P 18FR (CATHETERS) ×18 IMPLANT
CATH THORACIC 36FR RT ANG (CATHETERS) ×6 IMPLANT
CLIP APPLIE 9.375 SM OPEN (CLIP) IMPLANT
CLIP RETRACTION 3.0MM CORONARY (MISCELLANEOUS) ×6 IMPLANT
CLIP VESOCCLUDE MED 24/CT (CLIP) IMPLANT
CLIP VESOCCLUDE SM WIDE 24/CT (CLIP) ×18 IMPLANT
CONT SPEC 4OZ CLIKSEAL STRL BL (MISCELLANEOUS) ×6 IMPLANT
COVER MAYO STAND STRL (DRAPES) ×12 IMPLANT
COVER SURGICAL LIGHT HANDLE (MISCELLANEOUS) ×6 IMPLANT
CRADLE DONUT ADULT HEAD (MISCELLANEOUS) ×6 IMPLANT
DERMABOND ADVANCED (GAUZE/BANDAGES/DRESSINGS) ×2
DERMABOND ADVANCED .7 DNX12 (GAUZE/BANDAGES/DRESSINGS) ×4 IMPLANT
DRAIN CHANNEL 32F RND 10.7 FF (WOUND CARE) ×6 IMPLANT
DRAPE CARDIOVASCULAR INCISE (DRAPES) ×6
DRAPE HALF SHEET 40X57 (DRAPES) ×6 IMPLANT
DRAPE SLUSH/WARMER DISC (DRAPES) ×6 IMPLANT
DRAPE SRG 135X102X78XABS (DRAPES) ×4 IMPLANT
DRSG AQUACEL AG ADV 3.5X14 (GAUZE/BANDAGES/DRESSINGS) ×6 IMPLANT
ELECT BLADE 4.0 EZ CLEAN MEGAD (MISCELLANEOUS) ×6
ELECT BLADE 6.5 EXT (BLADE) ×6 IMPLANT
ELECT CAUTERY BLADE 6.4 (BLADE) ×6 IMPLANT
ELECT REM PT RETURN 9FT ADLT (ELECTROSURGICAL) ×12
ELECTRODE BLDE 4.0 EZ CLN MEGD (MISCELLANEOUS) ×4 IMPLANT
ELECTRODE REM PT RTRN 9FT ADLT (ELECTROSURGICAL) ×8 IMPLANT
FELT TEFLON 1X6 (MISCELLANEOUS) ×6 IMPLANT
FLOSEAL 10ML (HEMOSTASIS) ×6 IMPLANT
GAUZE SPONGE 4X4 12PLY STRL (GAUZE/BANDAGES/DRESSINGS) ×12 IMPLANT
GAUZE SPONGE 4X4 12PLY STRL LF (GAUZE/BANDAGES/DRESSINGS) ×18 IMPLANT
GEL ULTRASOUND 20GR AQUASONIC (MISCELLANEOUS) IMPLANT
GLOVE BIO SURGEON STRL SZ 6.5 (GLOVE) ×25 IMPLANT
GLOVE BIO SURGEON STRL SZ7.5 (GLOVE) ×24 IMPLANT
GLOVE BIO SURGEON STRL SZ8.5 (GLOVE) ×6 IMPLANT
GLOVE BIO SURGEONS STRL SZ 6.5 (GLOVE) ×5
GLOVE BIOGEL PI IND STRL 6.5 (GLOVE) ×20 IMPLANT
GLOVE BIOGEL PI INDICATOR 6.5 (GLOVE) ×10
GOWN STRL REUS W/ TWL LRG LVL3 (GOWN DISPOSABLE) ×36 IMPLANT
GOWN STRL REUS W/TWL LRG LVL3 (GOWN DISPOSABLE) ×54
HARMONIC SHEARS 14CM COAG (MISCELLANEOUS) ×6 IMPLANT
HEMOSTAT POWDER KIT SURGIFOAM (HEMOSTASIS) ×18 IMPLANT
HEMOSTAT POWDER SURGIFOAM 1G (HEMOSTASIS) ×18 IMPLANT
HEMOSTAT SURGICEL 2X14 (HEMOSTASIS) ×6 IMPLANT
INSERT FOGARTY XLG (MISCELLANEOUS) ×6 IMPLANT
KIT BASIN OR (CUSTOM PROCEDURE TRAY) ×6 IMPLANT
KIT ROOM TURNOVER OR (KITS) ×6 IMPLANT
KIT SUCTION CATH 14FR (SUCTIONS) ×6 IMPLANT
KIT VASOVIEW HEMOPRO VH 3000 (KITS) IMPLANT
LEAD PACING MYOCARDI (MISCELLANEOUS) ×6 IMPLANT
MARKER GRAFT CORONARY BYPASS (MISCELLANEOUS) ×6 IMPLANT
NS IRRIG 1000ML POUR BTL (IV SOLUTION) ×36 IMPLANT
PACK E OPEN HEART (SUTURE) ×6 IMPLANT
PACK OPEN HEART (CUSTOM PROCEDURE TRAY) ×6 IMPLANT
PAD ARMBOARD 7.5X6 YLW CONV (MISCELLANEOUS) ×12 IMPLANT
PAD ELECT DEFIB RADIOL ZOLL (MISCELLANEOUS) ×6 IMPLANT
PENCIL BUTTON HOLSTER BLD 10FT (ELECTRODE) ×12 IMPLANT
PUNCH AORTIC ROT 4.0MM RCL 40 (MISCELLANEOUS) ×6 IMPLANT
PUNCH AORTIC ROTATE 4.0MM (MISCELLANEOUS) IMPLANT
PUNCH AORTIC ROTATE 4.5MM 8IN (MISCELLANEOUS) IMPLANT
PUNCH AORTIC ROTATE 5MM 8IN (MISCELLANEOUS) IMPLANT
SET CARDIOPLEGIA MPS 5001102 (MISCELLANEOUS) ×6 IMPLANT
SHEARS HARMONIC 9CM CVD (BLADE) ×6 IMPLANT
SPONGE LAP 18X18 X RAY DECT (DISPOSABLE) ×6 IMPLANT
SPONGE LAP 4X18 X RAY DECT (DISPOSABLE) ×6 IMPLANT
STAPLER VISISTAT 35W (STAPLE) ×12 IMPLANT
SURGIFLO W/THROMBIN 8M KIT (HEMOSTASIS) IMPLANT
SUT BONE WAX W31G (SUTURE) ×6 IMPLANT
SUT MNCRL AB 4-0 PS2 18 (SUTURE) ×6 IMPLANT
SUT PROLENE 3 0 SH DA (SUTURE) ×6 IMPLANT
SUT PROLENE 3 0 SH1 36 (SUTURE) IMPLANT
SUT PROLENE 4 0 RB 1 (SUTURE) ×6
SUT PROLENE 4 0 SH DA (SUTURE) ×6 IMPLANT
SUT PROLENE 4-0 RB1 .5 CRCL 36 (SUTURE) ×4 IMPLANT
SUT PROLENE 5 0 C 1 36 (SUTURE) IMPLANT
SUT PROLENE 6 0 C 1 30 (SUTURE) IMPLANT
SUT PROLENE 6 0 CC (SUTURE) ×18 IMPLANT
SUT PROLENE 7.0 RB 3 (SUTURE) ×6 IMPLANT
SUT PROLENE 8 0 BV175 6 (SUTURE) ×12 IMPLANT
SUT PROLENE BLUE 7 0 (SUTURE) ×6 IMPLANT
SUT SILK  1 MH (SUTURE)
SUT SILK 1 MH (SUTURE) IMPLANT
SUT SILK 2 0 SH CR/8 (SUTURE) IMPLANT
SUT SILK 3 0 SH CR/8 (SUTURE) IMPLANT
SUT STEEL 6MS V (SUTURE) ×12 IMPLANT
SUT STEEL SZ 6 DBL 3X14 BALL (SUTURE) ×6 IMPLANT
SUT VIC AB 1 CTX 36 (SUTURE) ×18
SUT VIC AB 1 CTX36XBRD ANBCTR (SUTURE) ×12 IMPLANT
SUT VIC AB 2-0 CT1 27 (SUTURE) ×24
SUT VIC AB 2-0 CT1 TAPERPNT 27 (SUTURE) ×16 IMPLANT
SUT VIC AB 2-0 CTX 27 (SUTURE) IMPLANT
SUT VIC AB 3-0 SH 27 (SUTURE) ×6
SUT VIC AB 3-0 SH 27X BRD (SUTURE) ×4 IMPLANT
SUT VIC AB 3-0 X1 27 (SUTURE) ×6 IMPLANT
SYR 50ML SLIP (SYRINGE) IMPLANT
SYSTEM SAHARA CHEST DRAIN ATS (WOUND CARE) ×6 IMPLANT
TAPE CLOTH SURG 4X10 WHT LF (GAUZE/BANDAGES/DRESSINGS) ×6 IMPLANT
TAPE PAPER 2X10 WHT MICROPORE (GAUZE/BANDAGES/DRESSINGS) ×6 IMPLANT
TOWEL GREEN STERILE (TOWEL DISPOSABLE) ×6 IMPLANT
TOWEL GREEN STERILE FF (TOWEL DISPOSABLE) ×12 IMPLANT
TRAY FOLEY SILVER 16FR TEMP (SET/KITS/TRAYS/PACK) ×6 IMPLANT
TUBING INSUFFLATION (TUBING) IMPLANT
UNDERPAD 30X30 (UNDERPADS AND DIAPERS) ×12 IMPLANT
WATER STERILE IRR 1000ML POUR (IV SOLUTION) ×6 IMPLANT

## 2017-03-24 NOTE — Procedures (Signed)
Extubation Procedure Note  Patient Details:   Name: Christine Butler DOB: October 18, 1970 MRN: [id number]   Airway Documentation:     Evaluation  O2 sats: stable throughout Complications: No apparent complications Patient did tolerate procedure well. Bilateral Breath Sounds: Clear   Yes   Patient was extubated to a 3L [place] without any complications, dyspnea or stridor noted. Patient achieved a goal of -20 on NIF & 1L on VC. Patient was instructed on IS x 5, highest goal reached was 54mL.  Claretta Fraise 03/24/2017, 5:55 PM

## 2017-03-24 NOTE — Progress Notes (Signed)
Pre Procedure note for inpatients:   Christine Butler has been scheduled for Procedure(s): CORONARY ARTERY BYPASS GRAFTING (CABG) (N/A) TRANSESOPHAGEAL ECHOCARDIOGRAM (TEE) (N/A) RADIAL ARTERY HARVEST (Left) today. The various methods of treatment have been discussed with the patient. After consideration of the risks, benefits and treatment options the patient has consented to the planned procedure.   The patient has been seen and labs reviewed. There are no changes in the patient's condition to prevent proceeding with the planned procedure today.  Recent labs:  Lab Results  Component Value Date   WBC 4.6 03/24/2017   HGB 11.2 (L) 03/24/2017   HCT 33.8 (L) 03/24/2017   PLT 176 03/24/2017   GLUCOSE 107 (H) 03/24/2017   CHOL 406 (H) 03/23/2017   TRIG 309 (H) 03/23/2017   HDL 10 (L) 03/23/2017   LDLCALC 334 (H) 03/23/2017   ALT 63 (H) 03/23/2017   AST 83 (H) 03/23/2017   NA 138 03/24/2017   K 3.4 (L) 03/24/2017   CL 105 03/24/2017   CREATININE 0.43 (L) 03/24/2017   BUN 6 03/24/2017   CO2 21 (L) 03/24/2017   INR 1.0 03/16/2017    Len Childs, MD 03/24/2017 7:21 AM

## 2017-03-24 NOTE — Progress Notes (Signed)
[place] for Heparin Indication: severe LAD disease  Allergies  Allergen Reactions  . Codeine Nausea And Vomiting  . Erythromycin Nausea And Vomiting  . Fentanyl Nausea And Vomiting    Patient Measurements: Height: 5\' 2"  (157.5 cm) Weight: 163 lb 1.6 oz (74 kg)(scale a) IBW/kg (Calculated) : 50.1 Heparin Dosing Weight: 66kg  Vital Signs: Temp: 98.3 F (36.8 C) (01/29 0522) Temp Source: Oral (01/29 0522) BP: 136/55 (01/29 0522) Pulse Rate: 86 (01/29 0522)  Labs: Recent Labs    03/23/17 1947 03/24/17 0505  HGB  --  11.2*  HCT  --  33.8*  PLT  --  176  HEPARINUNFRC <0.10* 0.13*  CREATININE  --  0.43*    Estimated Creatinine Clearance: 81.9 mL/min (A) (by C-G formula based on SCr of 0.43 mg/dL (L)).  Assessment: 47yof s/p cath today found to have severe LAD stenosis not amenable to PCI. Heparin started 6 hours after radial sheath removal with plan for surgery tomorrow.   Heparin level is subtherapeutic. I spoke with the RN, planning to turn off heparin shortly in anticipation of CABG this morning.  Goal of Therapy:  Heparin level 0.3-0.7 units/ml Monitor platelets by anticoagulation protocol: Yes   Plan:  -F/u plans for surgery  Harvel Quale  03/24/2017 6:16 AM

## 2017-03-24 NOTE — Brief Op Note (Signed)
03/23/2017 - 03/24/2017  3:24 PM  PATIENT:  Christine Butler  47 y.o. female  PRE-OPERATIVE DIAGNOSIS:  CAD  POST-OPERATIVE DIAGNOSIS:  CAD  PROCEDURE:TRANSESOPHAGEAL ECHOCARDIOGRAM (TEE), MEDIAN STERNOTOMY for CORONARY ARTERY BYPASS GRAFTING (CABG) x 2 (LIMA to DIAGONAL and Portion of SVG to LEFT RADIAL ARTERY to LAD) using OPEN LEFT RADIAL ARTERY HARVEST and GREATER SAPHENOUS VEIN FROM  PORTION OF RIGHT THIGH via [place]   SURGEON:  Surgeon(s) and Role:    Ivin Poot, MD - Primary  PHYSICIAN ASSISTANT: Lars Pinks PA-C  ASSISTANTS: Dineen Kid RNFA  ANESTHESIA:   general  EBL:  1000 mL   BLOOD ADMINISTERED:Two FFP and one PLTS  DRAINS: Chest tubes placed in the mediastinal and pleural spaces   SPECIMEN:  Source of Specimen:  LIMA lymph node  DISPOSITION OF SPECIMEN:  PATHOLOGY  COUNTS CORRECT:  YES  DICTATION: .Dragon Dictation  PLAN OF CARE: Admit to inpatient   PATIENT DISPOSITION:  ICU - intubated and hemodynamically stable.   Delay start of Pharmacological VTE agent (>24hrs) due to surgical blood loss or risk of bleeding: yes  BASELINE WEIGHT: 74 kg

## 2017-03-24 NOTE — OR Nursing (Signed)
13:10 - 45 minute call to SICU charge nurse 13:45 - 20 minute call to SICU charge nurse

## 2017-03-24 NOTE — Anesthesia Procedure Notes (Addendum)
Central Venous Catheter Insertion Performed by: Nolon Nations, MD, anesthesiologist Start/End1/29/2019 6:42 AM, 03/24/2017 6:56 AM Patient location: Pre-op. Preanesthetic checklist: patient identified, IV checked, site marked, risks and benefits discussed, surgical consent, monitors and equipment checked, pre-op evaluation, timeout performed and anesthesia consent Hand hygiene performed  and maximum sterile barriers used  PA cath was placed.Swan type:thermodilution PA Cath depth:40 Procedure performed without using ultrasound guided technique. Attempts: 1 Patient tolerated the procedure well with no immediate complications.

## 2017-03-24 NOTE — Anesthesia Procedure Notes (Signed)
Procedure Name: Intubation Date/Time: 03/24/2017 7:42 AM Performed by: Julieta Bellini, CRNA Pre-anesthesia Checklist: Patient identified, Emergency Drugs available, Suction available, Patient being monitored and Timeout performed Patient Re-evaluated:Patient Re-evaluated prior to induction Oxygen Delivery Method: Circle system utilized Preoxygenation: Pre-oxygenation with 100% oxygen Induction Type: IV induction Ventilation: Mask ventilation without difficulty Laryngoscope Size: Mac and 4 Grade View: Grade I Tube size: 7.5 mm Number of attempts: 1 Airway Equipment and Method: Rigid stylet Placement Confirmation: ETT inserted through vocal cords under direct vision,  positive ETCO2 and breath sounds checked- equal and bilateral Secured at: 22 cm Tube secured with: Tape Dental Injury: Teeth and Oropharynx as per pre-operative assessment

## 2017-03-24 NOTE — Anesthesia Procedure Notes (Signed)
Arterial Line Insertion Start/End1/29/2019 6:35 AM, 03/24/2017 6:41 AM Performed by: Julieta Bellini, CRNA, CRNA  Patient location: Pre-op. Preanesthetic checklist: patient identified, IV checked, site marked, risks and benefits discussed, surgical consent, monitors and equipment checked, pre-op evaluation, timeout performed and anesthesia consent Lidocaine 1% used for infiltration and patient sedated Right, radial was placed Catheter size: 18 G Hand hygiene performed , maximum sterile barriers used  and Seldinger technique used Allen's test indicative of satisfactory collateral circulation Attempts: 1 Procedure performed without using ultrasound guided technique. Following insertion, dressing applied and Biopatch. Post procedure assessment: normal  Patient tolerated the procedure well with no immediate complications. Additional procedure comments: Performed by Alverda Skeans.

## 2017-03-24 NOTE — Anesthesia Procedure Notes (Addendum)
Central Venous Catheter Insertion Performed by: Nolon Nations, MD, anesthesiologist Start/End1/29/2019 6:42 AM, 03/24/2017 6:56 AM Patient location: Pre-op. Preanesthetic checklist: patient identified, IV checked, site marked, risks and benefits discussed, surgical consent, monitors and equipment checked, pre-op evaluation, timeout performed and anesthesia consent Position: Trendelenburg Lidocaine 1% used for infiltration and patient sedated Hand hygiene performed , maximum sterile barriers used  and Seldinger technique used Catheter size: 8.5 Fr Total catheter length 10. Central line was placed.Sheath introducer Swan type:thermodilution PA Cath depth:40 Procedure performed using ultrasound guided technique. Ultrasound Notes:anatomy identified, needle tip was noted to be adjacent to the nerve/plexus identified, no ultrasound evidence of intravascular and/or intraneural injection and image(s) printed for medical record Attempts: 1 Following insertion, line sutured, dressing applied and Biopatch. Post procedure assessment: blood return through all ports, free fluid flow and no air  Patient tolerated the procedure well with no immediate complications.

## 2017-03-24 NOTE — Progress Notes (Signed)
Initiated Open Heart Rapid Wean per Protocol 

## 2017-03-24 NOTE — Progress Notes (Signed)
Patient ID: Christine Butler, female   DOB: 10-12-1970, 47 y.o.   MRN: [id number] EVENING ROUNDS NOTE :     [address]       [place], [address]             [phone]                 Day of Surgery Procedure(s) (LRB): CORONARY ARTERY BYPASS GRAFTING (CABG) x two, using left internal mammary artery, left radial artery, and right leg greater saphenous vein harvested endoscopically (N/A) TRANSESOPHAGEAL ECHOCARDIOGRAM (TEE) (N/A) RADIAL ARTERY HARVEST (Left) ENDOVEIN HARVEST OF GREATER SAPHENOUS VEIN (Right)  Total Length of Stay:  LOS: 1 day  BP 94/62   Pulse (!) 108   Temp 99.7 F (37.6 C)   Resp 20   Ht 5\' 2"  (1.575 m)   Wt 163 lb 1.6 oz (74 kg) Comment: scale a  LMP 03/12/2017   SpO2 100%   BMI 29.83 kg/m   .Intake/Output      01/29 0701 - 01/30 0700   P.O.    I.V. (mL/kg) 3369.4 (45.5)   Blood 973   IV Piggyback 900   Total Intake(mL/kg) 5242.4 (70.8)   Urine (mL/kg/hr) 1590 (1.8)   Blood 1000   Chest Tube 195   Total Output 2785   Net +2457.4         . sodium chloride 20 mL/hr at 03/24/17 1900  . [START ON 03/25/2017] sodium chloride    . sodium chloride 20 mL/hr at 03/24/17 1900  . albumin human    . cefUROXime (ZINACEF)  IV Stopped (03/24/17 1904)  . dexmedetomidine (PRECEDEX) IV infusion Stopped (03/24/17 1650)  . famotidine (PEPCID) IV Stopped (03/24/17 1455)  . insulin (NOVOLIN-R) infusion Stopped (03/24/17 1619)  . lactated ringers    . lactated ringers 20 mL/hr at 03/24/17 1900  . milrinone 0.125 mcg/kg/min (03/24/17 1900)  . nitroGLYCERIN 5 mcg/min (03/24/17 1900)  . nitroGLYCERIN    . norepinephrine (LEVOPHED) Adult infusion Stopped (03/24/17 1525)  . phenylephrine (NEO-SYNEPHRINE) Adult infusion 65 mcg/min (03/24/17 1900)  . vancomycin       Lab Results  Component Value Date   WBC 12.0 (H) 03/24/2017   HGB 9.7 (L) 03/24/2017   HCT 28.9 (L) 03/24/2017   PLT 164 03/24/2017   GLUCOSE 95 03/24/2017   CHOL 406 (H) 03/23/2017    TRIG 309 (H) 03/23/2017   HDL 10 (L) 03/23/2017   LDLCALC 334 (H) 03/23/2017   ALT 63 (H) 03/23/2017   AST 83 (H) 03/23/2017   NA 143 03/24/2017   K 3.6 03/24/2017   CL 106 03/24/2017   CREATININE 0.30 (L) 03/24/2017   BUN 5 (L) 03/24/2017   CO2 21 (L) 03/24/2017   INR 1.32 03/24/2017   Now extubated, neuro intact but sleepy Low chest tube output  CI >3.5, decrease milrinone   Grace Isaac MD  Beeper [phone] Office [phone] 03/24/2017 7:14 PM

## 2017-03-24 NOTE — Anesthesia Postprocedure Evaluation (Signed)
Anesthesia Post Note  Patient: Christine Butler  Procedure(s) Performed: CORONARY ARTERY BYPASS GRAFTING (CABG) x two, using left internal mammary artery, left radial artery, and right leg greater saphenous vein harvested endoscopically (N/A Chest) TRANSESOPHAGEAL ECHOCARDIOGRAM (TEE) (N/A ) RADIAL ARTERY HARVEST (Left Arm Lower) ENDOVEIN HARVEST OF GREATER SAPHENOUS VEIN (Right Leg Upper)     Patient location during evaluation: SICU Anesthesia Type: General Level of consciousness: sedated Pain management: pain level controlled Vital Signs Assessment: post-procedure vital signs reviewed and stable Respiratory status: patient remains intubated per anesthesia plan Cardiovascular status: stable Postop Assessment: no apparent nausea or vomiting Anesthetic complications: no    Last Vitals:  Vitals:   03/24/17 1500 03/24/17 1515  BP: 106/72 110/72  Pulse: (!) 103 (!) 103  Resp: 17 18  Temp: 36.6 C 36.7 C  SpO2: 100% 100%    Last Pain:  Vitals:   03/24/17 0522  TempSrc: Oral  PainSc:                  Tiajuana Amass

## 2017-03-24 NOTE — Transfer of Care (Signed)
Immediate Anesthesia Transfer of Care Note  Patient: Christine Butler  Procedure(s) Performed: CORONARY ARTERY BYPASS GRAFTING (CABG) x two, using left internal mammary artery, left radial artery, and right leg greater saphenous vein harvested endoscopically (N/A Chest) TRANSESOPHAGEAL ECHOCARDIOGRAM (TEE) (N/A ) RADIAL ARTERY HARVEST (Left Arm Lower) ENDOVEIN HARVEST OF GREATER SAPHENOUS VEIN (Right Leg Upper)  Patient Location: SICU  Anesthesia Type:General  Level of Consciousness: sedated and Patient remains intubated per anesthesia plan  Airway & Oxygen Therapy: Patient remains intubated per anesthesia plan and Patient placed on Ventilator (see vital sign flow sheet for setting)  Post-op Assessment: Report given to RN and Post -op Vital signs reviewed and stable  Post vital signs: Reviewed and stable  Last Vitals:  Vitals:   03/24/17 0711 03/24/17 0712  BP:    Pulse: 80 85  Resp: 16   Temp:    SpO2: 99% 99%    Last Pain:  Vitals:   03/24/17 0522  TempSrc: Oral  PainSc:          Complications: No apparent anesthesia complications   Patient transported to ICU with standard monitors (HR, BP, SPO2, RR) and emergency drugs/equipment. Controlled ventilation maintained via ambu bag. Report given to bedside RN and respiratory therapist. Pt connected to ICU monitor and ventilator. All questions answered and vital signs stable before leavingPatient transported to ICU with standard monitors (HR, BP, SPO2, RR) and emergency drugs/equipment. Controlled ventilation maintained via ambu bag. Report given to bedside RN and respiratory therapist. Pt connected to ICU monitor and ventilator. All questions answered and vital signs stable before leaving

## 2017-03-25 ENCOUNTER — Inpatient Hospital Stay (HOSPITAL_COMMUNITY): Payer: 59

## 2017-03-25 ENCOUNTER — Encounter (HOSPITAL_COMMUNITY): Payer: Self-pay

## 2017-03-25 LAB — POCT I-STAT 3, ART BLOOD GAS (G3+)
ACID-BASE DEFICIT: 3 mmol/L — AB (ref 0.0–2.0)
BICARBONATE: 21.7 mmol/L (ref 20.0–28.0)
O2 Saturation: 95 %
TCO2: 23 mmol/L (ref 22–32)
pCO2 arterial: 38.6 mmHg (ref 32.0–48.0)
pH, Arterial: 7.362 (ref 7.350–7.450)
pO2, Arterial: 82 mmHg — ABNORMAL LOW (ref 83.0–108.0)

## 2017-03-25 LAB — GLUCOSE, CAPILLARY
GLUCOSE-CAPILLARY: 100 mg/dL — AB (ref 65–99)
GLUCOSE-CAPILLARY: 116 mg/dL — AB (ref 65–99)
Glucose-Capillary: 101 mg/dL — ABNORMAL HIGH (ref 65–99)
Glucose-Capillary: 101 mg/dL — ABNORMAL HIGH (ref 65–99)
Glucose-Capillary: 106 mg/dL — ABNORMAL HIGH (ref 65–99)
Glucose-Capillary: 115 mg/dL — ABNORMAL HIGH (ref 65–99)
Glucose-Capillary: 118 mg/dL — ABNORMAL HIGH (ref 65–99)

## 2017-03-25 LAB — POCT I-STAT, CHEM 8
BUN: 14 mg/dL (ref 6–20)
Calcium, Ion: 1.18 mmol/L (ref 1.15–1.40)
Chloride: 101 mmol/L (ref 101–111)
Creatinine, Ser: 0.5 mg/dL (ref 0.44–1.00)
GLUCOSE: 152 mg/dL — AB (ref 65–99)
HCT: 26 % — ABNORMAL LOW (ref 36.0–46.0)
HEMOGLOBIN: 8.8 g/dL — AB (ref 12.0–15.0)
POTASSIUM: 4 mmol/L (ref 3.5–5.1)
SODIUM: 139 mmol/L (ref 135–145)
TCO2: 26 mmol/L (ref 22–32)

## 2017-03-25 LAB — COMPREHENSIVE METABOLIC PANEL
ALT: 56 U/L — ABNORMAL HIGH (ref 14–54)
AST: 111 U/L — ABNORMAL HIGH (ref 15–41)
Albumin: 2.9 g/dL — ABNORMAL LOW (ref 3.5–5.0)
Alkaline Phosphatase: 120 U/L (ref 38–126)
Anion gap: 11 (ref 5–15)
BUN: 6 mg/dL (ref 6–20)
CO2: 19 mmol/L — ABNORMAL LOW (ref 22–32)
Calcium: 8.4 mg/dL — ABNORMAL LOW (ref 8.9–10.3)
Chloride: 106 mmol/L (ref 101–111)
Creatinine, Ser: 0.55 mg/dL (ref 0.44–1.00)
GFR calc Af Amer: >60 mL/min (ref >60)
GFR calc non Af Amer: >60 mL/min (ref >60)
Glucose, Bld: 107 mg/dL — ABNORMAL HIGH (ref 65–99)
Potassium: 4.4 mmol/L (ref 3.5–5.1)
Sodium: 136 mmol/L (ref 135–145)
Total Bilirubin: 8 mg/dL — ABNORMAL HIGH (ref 0.3–1.2)
Total Protein: 5.6 g/dL — ABNORMAL LOW (ref 6.5–8.1)

## 2017-03-25 LAB — CBC
HCT: 28.3 % — ABNORMAL LOW (ref 36.0–46.0)
HCT: 25.2 % — ABNORMAL LOW (ref 36.0–46.0)
HEMOGLOBIN: 9.2 g/dL — AB (ref 12.0–15.0)
Hemoglobin: 8.2 g/dL — ABNORMAL LOW (ref 12.0–15.0)
MCH: 31.7 pg (ref 26.0–34.0)
MCH: 32.3 pg (ref 26.0–34.0)
MCHC: 32.5 g/dL (ref 30.0–36.0)
MCHC: 32.5 g/dL (ref 30.0–36.0)
MCV: 97.6 fL (ref 78.0–100.0)
MCV: 99.2 fL (ref 78.0–100.0)
PLATELETS: 224 10*3/uL (ref 150–400)
Platelets: 194 10*3/uL (ref 150–400)
RBC: 2.9 MIL/uL — AB (ref 3.87–5.11)
RBC: 2.54 MIL/uL — ABNORMAL LOW (ref 3.87–5.11)
RDW: 15.4 % (ref 11.5–15.5)
RDW: 15.6 % — ABNORMAL HIGH (ref 11.5–15.5)
WBC: 14.8 10*3/uL — AB (ref 4.0–10.5)
WBC: 13 10*3/uL — ABNORMAL HIGH (ref 4.0–10.5)

## 2017-03-25 LAB — BPAM FFP
Blood Product Expiration Date: 201902032359
Blood Product Expiration Date: 201902032359
ISSUE DATE / TIME: 201901291214
ISSUE DATE / TIME: 201901291214
UNIT TYPE AND RH: 6200
Unit Type and Rh: 6200

## 2017-03-25 LAB — PREPARE FRESH FROZEN PLASMA
UNIT DIVISION: 0
Unit division: 0

## 2017-03-25 LAB — CREATININE, SERUM
CREATININE: 0.69 mg/dL (ref 0.44–1.00)
GFR calc Af Amer: >60 mL/min (ref >60)

## 2017-03-25 LAB — MAGNESIUM
MAGNESIUM: 2.1 mg/dL (ref 1.7–2.4)
MAGNESIUM: 2 mg/dL (ref 1.7–2.4)

## 2017-03-25 MED ORDER — MORPHINE SULFATE (PF) 4 MG/ML IV SOLN
1.0000 mg | INTRAVENOUS | Status: AC | PRN
  Administered 2017-03-25 (×3): 2 mg via INTRAVENOUS
  Filled 2017-03-25 (×2): qty 1

## 2017-03-25 MED ORDER — KETOROLAC TROMETHAMINE 15 MG/ML IJ SOLN
15.0000 mg | Freq: Four times a day (QID) | INTRAMUSCULAR | Status: AC
  Administered 2017-03-25 – 2017-03-27 (×8): 15 mg via INTRAVENOUS
  Filled 2017-03-25 (×8): qty 1

## 2017-03-25 MED ORDER — PROTAMINE SULFATE 10 MG/ML IV SOLN
INTRAVENOUS | Status: DC | PRN
  Administered 2017-03-24: 250 mg via INTRAVENOUS

## 2017-03-25 MED ORDER — INSULIN ASPART 100 UNIT/ML ~~LOC~~ SOLN: 0.0000 [IU] | SUBCUTANEOUS | Status: DC

## 2017-03-25 MED ORDER — FUROSEMIDE 10 MG/ML IJ SOLN
20.0000 mg | Freq: Two times a day (BID) | INTRAMUSCULAR | Status: DC
  Administered 2017-03-25 – 2017-03-26 (×4): 20 mg via INTRAVENOUS
  Filled 2017-03-25 (×4): qty 2

## 2017-03-25 MED ORDER — ALBUMIN HUMAN 25 % IV SOLN
12.5000 g | Freq: Four times a day (QID) | INTRAVENOUS | Status: AC
  Administered 2017-03-25 – 2017-03-26 (×4): 12.5 g via INTRAVENOUS
  Filled 2017-03-25 (×4): qty 50

## 2017-03-25 MED ORDER — ISOSORBIDE MONONITRATE ER 30 MG PO TB24
15.0000 mg | ORAL_TABLET | Freq: Every day | ORAL | Status: DC
  Administered 2017-03-25 – 2017-04-03 (×10): 15 mg via ORAL
  Filled 2017-03-25 (×11): qty 1

## 2017-03-25 MED ORDER — ORAL CARE MOUTH RINSE
15.0000 mL | Freq: Two times a day (BID) | OROMUCOSAL | Status: DC
  Administered 2017-03-26: 15 mL via OROMUCOSAL

## 2017-03-25 NOTE — Op Note (Signed)
NAME:  Christine Butler, Christine Butler                     ACCOUNT NO.:  MEDICAL RECORD NO.:  [id number]  LOCATION:                                 FACILITY:  PHYSICIAN:  Ivin Poot, M.D.  DATE OF BIRTH:  1970/11/21  DATE OF PROCEDURE:  03/24/2017 DATE OF DISCHARGE:                              OPERATIVE REPORT   OPERATIONS: 1. Coronary artery bypass grafting x2 (left internal mammary artery to     dominant diagonal, left radial artery free graft to left anterior     descending with interposition vein graft at the proximal     anastomosis). 2. Harvest of left radial artery as a free graft. 3. Right greater saphenous vein endoscopic harvest.  SURGEON:  Ivin Poot, MD.  ASSISTANT:  Lars Pinks, PA-C.  PREOPERATIVE DIAGNOSES:  Unstable angina, severe 2-vessel coronary artery disease.  POSTOPERATIVE DIAGNOSES:  Unstable angina, severe 2-vessel coronary artery disease.  ANESTHESIA:  General.  INDICATIONS:  The patient is a 47 year old obese female with symptoms of angina for the past 3 months, increasing intensity and frequency.  She was seen by cardiologist, who recommended cardiac catheterization, which demonstrated a 95-99% stenosis of the LAD diagonal bifurcation.  She was not felt to be a candidate for PCI because of the anatomy of the disease.  Her other vessels were clear.  LV function was well preserved. She was referred for multivessel CABG. I saw the patient in consultation, after reviewing her coronary arteriograms in medical record.  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 She is followed by GI Medicine.  The patient has had no major previous surgical procedures except for GYN pelvic floor procedure.  She tolerated that anesthesia well and she had no bleeding problems. Assessment of her liver function testing demonstrates normal INR, satisfactory albumin and mild elevation of the transaminases  and alkaline phosphatase and bilirubin of up to 5.  Her bilirubin usually runs approximately 3.0.  I placed a call to her Gastroenterology group for preoperative consultation and evaluation as well as postoperative recommendations.  I discussed the procedure of CABG with the patient and her friend-family including indications, benefits, and risks.  She understood this operation could definitely impact her hepatic function.  She understood we would use cardiopulmonary bypass and that the target vessels were very small and we would need cardioplegic arrest to perform the anastomoses.  She understood the risk of bleeding, blood transfusion, stroke, infection, worsening liver function or complete liver failure, organ failure, stroke, and death.  She gave consent for the surgery.  OPERATIVE FINDINGS: 1. The coronary vessels were extremely small and would not be adequate     targets for re-grafting. 2. The arterial conduits were also very small, but correlated well to     the native coronary vessel size.  Because the radial artery was a     small vessel, I did not feel that it would be beneficial to sew     that directly to the much thicker aortic wall, so an interposition     vein graft was used for the radial artery graft at  the proximal     anastomosis.  I chose to place the larger mammary artery to her     diagonal, which was the dominant vessel on the anterior LV wall and     the smaller radial was placed to the smaller LAD and her native     coronary circulation.  DESCRIPTION OF PROCEDURE:  The patient was brought to the operating room and placed supine on the operating table.  General anesthesia was induced under invasive hemodynamic monitoring.  A transesophageal echo probe was placed by the Anesthesia team.  The chest, abdomen, and legs were prepped with Betadine as well as the left arm.  The patient was draped as a sterile field.  A proper time-out was performed. Simultaneously,  a sternal incision and a left arm incision were made and the mammary artery was harvested as a pedicle graft and the left radial artery was grafted as a free graft after confirming that there was good perfusion of the hand with clamping of the radial artery.  The left arm incision was closed in 2 layers using Vicryl and skin staples and then tucked back to the side.  The sternal retractor was placed using the deep blades because of the patient's short obese body habitus.  The pericardium was opened. Pursestrings were placed in the ascending aorta and right atrium and heparin was administered.  When the ACT was documented as being therapeutic, the patient was cannulated and placed on cardiopulmonary bypass.  We then dissected and found the coronary arteries.  There was a large layer of epicardial fat.  The coronary vessels were 1 mm.  The mammary artery and radial artery were prepared for the distal anastomoses and cardioplegia cannulas were placed both antegrade and retrograde cold blood cardioplegia.  The patient was cooled to 32 degrees and aortic crossclamp was applied.  A liter of cold blood cardioplegia was delivered in split doses between the antegrade aortic and retrograde coronary sinus catheters.  There was good cardioplegic arrest and septal temperature dropped less than 12 degrees. Cardioplegia was delivered every 20 minutes.  The first distal anastomosis was to the small 1-mm LAD using the radial artery free graft.  This was carefully done with a running 8-0 Prolene in a very tedious and careful fashion.  There was good flow through the graft through hand injection.  Next, the left mammary artery pedicle graft was brought down through the pericardium to the diagonal branch to the LAD which was a large, more dominant vessel by the arteriograms.  It was sewn end-to-side with running 8-0 Prolene and there was good flow through the graft after briefly releasing the pedicle  bulldog.  The bulldog was reapplied and the pedicle was secured to the epicardium.  Next, while the crossclamp was in place, a segment of the right saphenous vein was sewn as a proximal anastomosis to the aorta with running 6-0 Prolene.  This was tied off, but the vessel stump was used as the landing zone for the radial artery with running 7-0 Prolene after the crossclamp was removed.  After the clamp was removed and the radial artery was anastomosed end-to- side of the saphenous vein, all the anastomoses were checked and found to be hemostatic.  The heart was cardioverted back to a regular rhythm and the patient was rewarmed and reperfused.  Temporary pacing wires were applied.  The lungs were expanded.  The ventilator was resumed. The patient was weaned off cardiopulmonary bypass on low-dose milrinone without difficulty.  Cardiac output and blood pressure were stable. Protamine was administered without adverse reaction.  There was still diffuse coagulopathy probably related to the patient's underlying liver disease and this improved with FFP transfusion.  The superior pericardial fat was closed over the aorta.  Anterior mediastinal and left pleural chest tubes were placed and brought out through separate incisions.  The sternum was closed with wire.  The pectoralis fascia was closed with a running #1 Vicryl.  The subcutaneous and skin layers were closed in running Vicryl.  Sterile dressings were applied. The patient returned to the ICU in stable condition.  Total cardiopulmonary bypass time was 110 minutes.     Ivin Poot, M.D.     PV/MEDQ  D:  03/24/2017  T:  03/24/2017  Job:  [id number]  cc:   Jettie Booze, MD

## 2017-03-25 NOTE — Progress Notes (Signed)
CT surgery p.m. Rounds  Patient had a good day-nausea controlled Out of bed to chair Still on phenylephrine for vasodilatation P.m. Labs satisfactory Following liver function

## 2017-03-25 NOTE — Addendum Note (Signed)
Addendum  created 03/25/17 1050 by Josephine Igo, CRNA   Intraprocedure Meds edited

## 2017-03-25 NOTE — Progress Notes (Signed)
Progress Note  Patient Name: Christine Butler Date of Encounter: 03/25/2017  Primary Cardiologist: Revankar  Subjective   Chest wall pain.   Inpatient Medications    Scheduled Meds: . aspirin EC  325 mg Oral Daily   Or  . aspirin  324 mg Per Tube Daily  . bisacodyl  10 mg Oral Daily   Or  . bisacodyl  10 mg Rectal Daily  . chlorhexidine gluconate (MEDLINE KIT)  15 mL Mouth Rinse BID  . cycloSPORINE  1 drop Both Eyes Daily  . docusate sodium  200 mg Oral Daily  . furosemide  20 mg Intravenous BID  . insulin aspart  0-24 Units Subcutaneous Q4H  . isosorbide mononitrate  15 mg Oral Daily  . ketorolac  15 mg Intravenous Q6H  . mouth rinse  15 mL Mouth Rinse QID  . Melatonin  6 mg Oral QHS  . metoCLOPramide (REGLAN) injection  10 mg Intravenous Q6H  . metoprolol tartrate  12.5 mg Oral BID   Or  . metoprolol tartrate  12.5 mg Per Tube BID  . mupirocin ointment   Nasal BID  . [START ON 03/26/2017] pantoprazole  40 mg Oral Daily  . sodium chloride flush  3 mL Intravenous Q12H  . trimethoprim  100 mg Oral QODAY  . ursodiol  900 mg Oral Q breakfast   And  . ursodiol  600 mg Oral Q supper   Continuous Infusions: . sodium chloride 20 mL/hr at 03/25/17 0800  . sodium chloride    . sodium chloride 20 mL/hr at 03/25/17 0800  . albumin human    . albumin human    . cefUROXime (ZINACEF)  IV Stopped (03/25/17 0530)  . dexmedetomidine (PRECEDEX) IV infusion Stopped (03/24/17 1650)  . lactated ringers    . lactated ringers 20 mL/hr at 03/25/17 0800  . phenylephrine (NEO-SYNEPHRINE) Adult infusion 30 mcg/min (03/25/17 0939)   PRN Meds: sodium chloride, albumin human, metoprolol tartrate, midazolam, morphine injection, morphine injection, ondansetron (ZOFRAN) IV, oxyCODONE, promethazine, sodium chloride flush, traMADol   Vital Signs    Vitals:   03/25/17 0645 03/25/17 0700 03/25/17 0715 03/25/17 0800  BP:  107/65  106/60  Pulse: (!) 106 (!) 104 (!) 106 (!) 108  Resp: _0 Temp: 99.9 F (37.7 C) 99.7 F (37.6 C) 99.7 F (37.6 C) 99.7 F (37.6 C)  TempSrc:      SpO2: 97% 96% 97% 97%  Weight:      Height:        Intake/Output Summary (Last 24 hours) at 03/25/2017 0950 Last data filed at 03/25/2017 0800 Gross per 24 hour  Intake 7012.15 ml  Output 3485 ml  Net 3527.15 ml   Filed Weights   03/23/17 1347 03/24/17 0522 03/25/17 0500  Weight: 166 lb 3 oz (75.4 kg) 163 lb 1.6 oz (74 kg) 175 lb 11.3 oz (79.7 kg)    Telemetry    Sinus - Personally Reviewed  ECG    Sinus, no ischemic changes - Personally Reviewed  Physical Exam   GEN: No acute distress.   Neck: No JVD Cardiac: Regular, tachy.   Respiratory: Clear to auscultation bilaterally. GI: Soft, nontender, non-distended  MS: No edema; No deformity. Neuro:  Nonfocal  Psych: Normal affect   Labs    Chemistry Recent Labs  Lab 03/23/17 1456 03/24/17 0505  03/24/17 1316 03/24/17 1427 03/24/17 1944 03/24/17 1945 03/25/17 0411  NA  --  138   < > 141 143  141  --  136  K  --  3.4*   < > 3.5 3.6 4.2  --  4.4  CL  --  105   < > 106  --  108  --  106  CO2  --  21*  --   --   --   --   --  19*  GLUCOSE  --  107*   < > 129* 95 106*  --  107*  BUN  --  6   < > 5*  --  6  --  6  CREATININE  --  0.43*   < > 0.30*  --  0.40* 0.54 0.55  CALCIUM  --  8.5*  --   --   --   --   --  8.4*  PROT 7.0  --   --   --   --   --   --  5.6*  ALBUMIN 2.9*  --   --   --   --   --   --  2.9*  AST 83*  --   --   --   --   --   --  111*  ALT 63*  --   --   --   --   --   --  56*  ALKPHOS 227*  --   --   --   --   --   --  120  BILITOT 8.0*  --   --   --   --   --   --  8.0*  GFRNONAA  --  >60  --   --   --   --  >60 >60  GFRAA  --  >60  --   --   --   --  >60 >60  ANIONGAP  --  12  --   --   --   --   --  11   < > = values in this interval not displayed.     Hematology Recent Labs  Lab 03/24/17 1430 03/24/17 1944 03/24/17 1945 03/25/17 0411  WBC 12.0*  --  13.7* 14.8*  RBC 2.98*  --  2.99*  2.90*  HGB 9.7* 9.9* 9.7* 9.2*  HCT 28.9* 29.0* 29.1* 28.3*  MCV 97.0  --  97.3 97.6  MCH 32.6  --  32.4 31.7  MCHC 33.6  --  33.3 32.5  RDW 15.5  --  15.1 15.4  PLT 164  --  220 224    Cardiac EnzymesNo results for input(s): TROPONINI in the last 168 hours. No results for input(s): TROPIPOC in the last 168 hours.   BNPNo results for input(s): BNP, PROBNP in the last 168 hours.   DDimer No results for input(s): DDIMER in the last 168 hours.   Radiology    Dg Chest Port 1 View  Result Date: 03/24/2017 CLINICAL DATA:  Status post coronary artery bypass graft. EXAM: PORTABLE CHEST 1 VIEW COMPARISON:  Radiograph March 16, 2017. FINDINGS: Stable cardiomediastinal silhouette. Endotracheal tube is seen projected over tracheal air shadow with distal tip 3.6 cm above the carina. Nasogastric tube is seen entering stomach. Left-sided chest tube is noted without pneumothorax. Right lung is clear. Right internal jugular Swan-Ganz catheter is noted with distal tip in expected position of main pulmonary artery. Bony thorax is unremarkable. IMPRESSION: Endotracheal and nasogastric tubes are in grossly good position. Left-sided chest tube is noted without evidence of pneumothorax. Electronically Signed   By: Sabino Dick  Brooke Bonito, M.D.   On: 03/24/2017 14:43    Cardiac Studies     Patient Profile     47 y.o. female with unstable angina who was found to have severe LAD/Diagonal stenosis by cath 03/23/17. She is s/p 2 V CABG on 03/24/17 (LIMA and SVG/radial to LAD and Diagonal)  Assessment & Plan    1. CAD/Unstable angina: stable this am. Still on Neo. Post-op volume overload. She is being diuresed. Sinus tach on tele. Continue ASA and beta blocker. Start statin.  For questions or updates, please contact [facility] Please consult [email or web address] for contact info under Cardiology/STEMI.      Signed, Lauree Chandler, MD  03/25/2017, 9:50 AM

## 2017-03-25 NOTE — Progress Notes (Signed)
1 Day Post-Op Procedure(s) (LRB): CORONARY ARTERY BYPASS GRAFTING (CABG) x two, using left internal mammary artery, left radial artery, and right leg greater saphenous vein harvested endoscopically (N/A) TRANSESOPHAGEAL ECHOCARDIOGRAM (TEE) (N/A) RADIAL ARTERY HARVEST (Left) ENDOVEIN HARVEST OF GREATER SAPHENOUS VEIN (Right) Subjective: Extubated but bili up to 8.0 holding nephrotxic meds Excellent cardiac output  Objective: Vital signs in last 24 hours: Temp:  [97.7 F (36.5 C)-100.8 F (38.2 C)] 99.7 F (37.6 C) (01/30 0715) Pulse Rate:  [101-117] 106 (01/30 0715) Cardiac Rhythm: Sinus tachycardia;Bundle branch block (01/30 0600) Resp:  [9-30] 18 (01/30 0715) BP: (82-110)/(50-72) 107/65 (01/30 0700) SpO2:  [92 %-100 %] 97 % (01/30 0715) Arterial Line BP: (84-125)/(44-70) 118/49 (01/30 0715) FiO2 (%):  [40 %-50 %] 40 % (01/29 1715) Weight:  [175 lb 11.3 oz (79.7 kg)] 175 lb 11.3 oz (79.7 kg) (01/30 0500)  Hemodynamic parameters for last 24 hours: PAP: (16-29)/(6-15) 28/14 CO:  [6 L/min-7.3 L/min] 6.4 L/min CI:  [3.4 L/min/m2-4.2 L/min/m2] 3.7 L/min/m2  Intake/Output from previous day: 01/29 0701 - 01/30 0700 In: 6686.5 [P.O.:120; I.V.:4443.5; Blood:973; IV [id number]:4403] Out: 3670 [Urine:2170; Blood:1000; Chest Tube:500] Intake/Output this shift: No intake/output data recorded.       Exam    General- alert and comfortable    Neck- no JVD, no cervical adenopathy palpable, no carotid bruit   Lungs- clear without rales, wheezes   Cor- regular rate and rhythm, no murmur , gallop   Abdomen- soft, non-tender   Extremities - warm, non-tender, minimal edema   Neuro- oriented, appropriate, no focal weakness   Lab Results: Recent Labs    03/24/17 1945 03/25/17 0411  WBC 13.7* 14.8*  HGB 9.7* 9.2*  HCT 29.1* 28.3*  PLT 220 224   BMET:  Recent Labs    03/24/17 0505  03/24/17 1944 03/24/17 1945 03/25/17 0411  NA 138   < > 141  --  136  K 3.4*   < > 4.2  --  4.4   CL 105   < > 108  --  106  CO2 21*  --   --   --  19*  GLUCOSE 107*   < > 106*  --  107*  BUN 6   < > 6  --  6  CREATININE 0.43*   < > 0.40* 0.54 0.55  CALCIUM 8.5*  --   --   --  8.4*   < > = values in this interval not displayed.    PT/INR:  Recent Labs    03/24/17 1430  LABPROT 16.3*  INR 1.32   ABG    Component Value Date/Time   PHART 7.362 03/25/2017 0423   HCO3 21.7 03/25/2017 0423   TCO2 23 03/25/2017 0423   ACIDBASEDEF 3.0 (H) 03/25/2017 0423   O2SAT 95.0 03/25/2017 0423   CBG (last 3)  Recent Labs    03/24/17 1617 03/25/17 0019 03/25/17 0422  GLUCAP 100* 118* 101*    Assessment/Plan: S/P Procedure(s) (LRB): CORONARY ARTERY BYPASS GRAFTING (CABG) x two, using left internal mammary artery, left radial artery, and right leg greater saphenous vein harvested endoscopically (N/A) TRANSESOPHAGEAL ECHOCARDIOGRAM (TEE) (N/A) RADIAL ARTERY HARVEST (Left) ENDOVEIN HARVEST OF GREATER SAPHENOUS VEIN (Right) Mobilize Diuresis d/c tubes/lines daily LFTs - hx biluary cirrhosis   LOS: 2 days    Tharon Aquas Trigt III 03/25/2017

## 2017-03-26 ENCOUNTER — Inpatient Hospital Stay (HOSPITAL_COMMUNITY): Payer: 59

## 2017-03-26 LAB — COMPREHENSIVE METABOLIC PANEL
ALT: 63 U/L — ABNORMAL HIGH (ref 14–54)
AST: 103 U/L — ABNORMAL HIGH (ref 15–41)
Albumin: 3 g/dL — ABNORMAL LOW (ref 3.5–5.0)
Alkaline Phosphatase: 108 U/L (ref 38–126)
Anion gap: 10 (ref 5–15)
BUN: 14 mg/dL (ref 6–20)
CO2: 23 mmol/L (ref 22–32)
Calcium: 8.4 mg/dL — ABNORMAL LOW (ref 8.9–10.3)
Chloride: 105 mmol/L (ref 101–111)
Creatinine, Ser: 0.59 mg/dL (ref 0.44–1.00)
GFR calc Af Amer: >60 mL/min (ref >60)
GFR calc non Af Amer: >60 mL/min (ref >60)
Glucose, Bld: 111 mg/dL — ABNORMAL HIGH (ref 65–99)
Potassium: 3.8 mmol/L (ref 3.5–5.1)
Sodium: 138 mmol/L (ref 135–145)
Total Bilirubin: 8.4 mg/dL — ABNORMAL HIGH (ref 0.3–1.2)
Total Protein: 5.8 g/dL — ABNORMAL LOW (ref 6.5–8.1)

## 2017-03-26 LAB — CBC
HCT: 24.4 % — ABNORMAL LOW (ref 36.0–46.0)
Hemoglobin: 7.8 g/dL — ABNORMAL LOW (ref 12.0–15.0)
MCH: 31.7 pg (ref 26.0–34.0)
MCHC: 32 g/dL (ref 30.0–36.0)
MCV: 99.2 fL (ref 78.0–100.0)
Platelets: 136 10*3/uL — ABNORMAL LOW (ref 150–400)
RBC: 2.46 MIL/uL — ABNORMAL LOW (ref 3.87–5.11)
RDW: 15.7 % — ABNORMAL HIGH (ref 11.5–15.5)
WBC: 9 10*3/uL (ref 4.0–10.5)

## 2017-03-26 LAB — GLUCOSE, CAPILLARY
GLUCOSE-CAPILLARY: 109 mg/dL — AB (ref 65–99)
GLUCOSE-CAPILLARY: 98 mg/dL (ref 65–99)
Glucose-Capillary: 104 mg/dL — ABNORMAL HIGH (ref 65–99)
Glucose-Capillary: 106 mg/dL — ABNORMAL HIGH (ref 65–99)
Glucose-Capillary: 111 mg/dL — ABNORMAL HIGH (ref 65–99)
Glucose-Capillary: 116 mg/dL — ABNORMAL HIGH (ref 65–99)

## 2017-03-26 MED ORDER — FENTANYL 25 MCG/HR TD PT72
50.0000 ug | MEDICATED_PATCH | TRANSDERMAL | Status: DC
  Administered 2017-03-26 – 2017-04-01 (×3): 50 ug via TRANSDERMAL
  Filled 2017-03-26 (×5): qty 2

## 2017-03-26 MED ORDER — LIDOCAINE 5 % EX PTCH
1.0000 | MEDICATED_PATCH | CUTANEOUS | Status: DC
  Administered 2017-03-26 – 2017-04-02 (×5): 1 via TRANSDERMAL
  Filled 2017-03-26 (×8): qty 1

## 2017-03-26 MED ORDER — ALBUMIN HUMAN 25 % IV SOLN
12.5000 g | Freq: Four times a day (QID) | INTRAVENOUS | Status: AC
  Administered 2017-03-26 – 2017-03-27 (×4): 12.5 g via INTRAVENOUS
  Filled 2017-03-26 (×4): qty 50

## 2017-03-26 MED FILL — Calcium Chloride Inj 10%: INTRAVENOUS | Qty: 10 | Status: AC

## 2017-03-26 MED FILL — Heparin Sodium (Porcine) Inj 1000 Unit/ML: INTRAMUSCULAR | Qty: 20 | Status: AC

## 2017-03-26 MED FILL — Sodium Chloride IV Soln 0.9%: INTRAVENOUS | Qty: 2000 | Status: AC

## 2017-03-26 MED FILL — Sodium Bicarbonate IV Soln 8.4%: INTRAVENOUS | Qty: 50 | Status: AC

## 2017-03-26 MED FILL — Electrolyte-R (PH 7.4) Solution: INTRAVENOUS | Qty: 4000 | Status: AC

## 2017-03-26 MED FILL — Magnesium Sulfate Inj 50%: INTRAMUSCULAR | Qty: 10 | Status: AC

## 2017-03-26 MED FILL — Potassium Chloride Inj 2 mEq/ML: INTRAVENOUS | Qty: 40 | Status: AC

## 2017-03-26 MED FILL — Lidocaine HCl IV Inj 20 MG/ML: INTRAVENOUS | Qty: 10 | Status: AC

## 2017-03-26 MED FILL — Heparin Sodium (Porcine) Inj 1000 Unit/ML: INTRAMUSCULAR | Qty: 30 | Status: AC

## 2017-03-26 MED FILL — Mannitol IV Soln 20%: INTRAVENOUS | Qty: 500 | Status: AC

## 2017-03-26 NOTE — Progress Notes (Signed)
Patient ID: Christine Butler, female   DOB: 08/06/1970, 47 y.o.   MRN: [id number] EVENING ROUNDS NOTE :     [address]       [place] [address]             [phone]                 2 Days Post-Op Procedure(s) (LRB): CORONARY ARTERY BYPASS GRAFTING (CABG) x two, using left internal mammary artery, left radial artery, and right leg greater saphenous vein harvested endoscopically (N/A) TRANSESOPHAGEAL ECHOCARDIOGRAM (TEE) (N/A) RADIAL ARTERY HARVEST (Left) ENDOVEIN HARVEST OF GREATER SAPHENOUS VEIN (Right)  Total Length of Stay:  LOS: 3 days  BP (!) 93/54   Pulse 100   Temp 98.7 F (37.1 C) (Oral)   Resp 20   Ht 5\' 2"  (1.575 m)   Wt 175 lb 11.3 oz (79.7 kg)   LMP 03/12/2017   SpO2 97%   BMI 32.14 kg/m   .Intake/Output      01/30 0701 - 01/31 0700 01/31 0701 - 02/01 0700   P.O. 360 320   I.V. (mL/kg) 1627.5 (20.4) 40 (0.5)   Blood     IV Piggyback 250 50   Total Intake(mL/kg) 2237.5 (28.1) 410 (5.1)   Urine (mL/kg/hr) 1250 (0.7) 525 (0.6)   Blood     Chest Tube 350 60   Total Output 1600 585   Net +637.5 -175          . sodium chloride Stopped (03/25/17 1900)  . sodium chloride    . sodium chloride Stopped (03/25/17 1023)  . albumin human    . lactated ringers    . phenylephrine (NEO-SYNEPHRINE) Adult infusion Stopped (03/26/17 0400)     Lab Results  Component Value Date   WBC 9.0 03/26/2017   HGB 7.8 (L) 03/26/2017   HCT 24.4 (L) 03/26/2017   PLT 136 (L) 03/26/2017   GLUCOSE 111 (H) 03/26/2017   CHOL 406 (H) 03/23/2017   TRIG 309 (H) 03/23/2017   HDL 10 (L) 03/23/2017   LDLCALC 334 (H) 03/23/2017   ALT 63 (H) 03/26/2017   AST 103 (H) 03/26/2017   NA 138 03/26/2017   K 3.8 03/26/2017   CL 105 03/26/2017   CREATININE 0.59 03/26/2017   BUN 14 03/26/2017   CO2 23 03/26/2017   INR 1.32 03/24/2017   Bili elevated, but overall feels better  [facility] 190 feet today x2 today    Grace Isaac MD  Beeper [phone] Office  [phone] 03/26/2017 6:53 PM

## 2017-03-26 NOTE — Progress Notes (Signed)
Central line removed without difficulty.  Patient tol well.  In chair for 3 hours, ambulated x 190 feet, then back to bed.  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  Will address with MD.

## 2017-03-26 NOTE — Care Management Note (Signed)
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 She is s/p 2 V CABG on 03/24/17                  Action/Plan: PTA Pt lived at home, independent- anticipate return home- CM to follow for transition of care needs.   Expected Discharge Date:  04/01/17               Expected Discharge Plan:  Home/Self Care  In-House Referral:     Discharge planning Services  CM Consult  Post Acute Care Choice:    Choice offered to:     DME Arranged:    DME Agency:     HH Arranged:    HH Agency:     Status of Service:  In process, will continue to follow  If discussed at Long Length of Stay Meetings, dates discussed:    Discharge Disposition:   Additional Comments:  Dawayne Patricia, RN 03/26/2017, 9:46 AM

## 2017-03-26 NOTE — Progress Notes (Signed)
2 Days Post-Op Procedure(s) (LRB): CORONARY ARTERY BYPASS GRAFTING (CABG) x two, using left internal mammary artery, left radial artery, and right leg greater saphenous vein harvested endoscopically (N/A) TRANSESOPHAGEAL ECHOCARDIOGRAM (TEE) (N/A) RADIAL ARTERY HARVEST (Left) ENDOVEIN HARVEST OF GREATER SAPHENOUS VEIN (Right) Subjective: OOB , stonger Off neo Bilirubin stable at 8 Pain an issue Expected postop blood loss anemia Objective: Vital signs in last 24 hours: Temp:  [97.7 F (36.5 C)-98.5 F (36.9 C)] 98.1 F (36.7 C) (01/31 0742) Pulse Rate:  [95-119] 111 (01/31 0830) Cardiac Rhythm: Sinus tachycardia (01/31 0800) Resp:  [0-31] 22 (01/31 0830) BP: (83-130)/(51-74) 109/68 (01/31 0830) SpO2:  [93 %-100 %] 96 % (01/31 0830) Arterial Line BP: (61-129)/(55-56) 61/56 (01/30 1300) Weight:  [175 lb 11.3 oz (79.7 kg)] 175 lb 11.3 oz (79.7 kg) (01/31 0400)  Hemodynamic parameters for last 24 hours:    Intake/Output from previous day: 01/30 0701 - 01/31 0700 In: 2237.5 [P.O.:360; I.V.:1627.5; IV Piggyback:250] Out: 1600 [Urine:1250; Chest Tube:350] Intake/Output this shift:  .pvt      Exam       Exam    General- alert and comfortable    Neck- no JVD, no cervical adenopathy palpable, no carotid bruit   Lungs- clear without rales, wheezes   Cor- regular rate and rhythm, no murmur , gallop   Abdomen- soft, non-tender   Extremities - warm, non-tender, minimal edema   Neuro- oriented, appropriate, no focal weakness    Lab Results: Recent Labs    03/25/17 1755 03/26/17 0352  WBC 13.0* 9.0  HGB 8.2*  8.8* 7.8*  HCT 25.2*  26.0* 24.4*  PLT 194 136*   BMET:  Recent Labs    03/25/17 0411 03/25/17 1755 03/26/17 0352  NA 136 139 138  K 4.4 4.0 3.8  CL 106 101 105  CO2 19*  --  23  GLUCOSE 107* 152* 111*  BUN 6 14 14   CREATININE 0.55 0.69  0.50 0.59  CALCIUM 8.4*  --  8.4*    PT/INR:  Recent Labs    03/24/17 1430  LABPROT 16.3*  INR 1.32   ABG     Component Value Date/Time   PHART 7.362 03/25/2017 0423   HCO3 21.7 03/25/2017 0423   TCO2 26 03/25/2017 1755   ACIDBASEDEF 3.0 (H) 03/25/2017 0423   O2SAT 95.0 03/25/2017 0423   CBG (last 3)  Recent Labs    03/26/17 0029 03/26/17 0356 03/26/17 0744  GLUCAP 98 104* 116*    Assessment/Plan: S/P Procedure(s) (LRB): CORONARY ARTERY BYPASS GRAFTING (CABG) x two, using left internal mammary artery, left radial artery, and right leg greater saphenous vein harvested endoscopically (N/A) TRANSESOPHAGEAL ECHOCARDIOGRAM (TEE) (N/A) RADIAL ARTERY HARVEST (Left) ENDOVEIN HARVEST OF GREATER SAPHENOUS VEIN (Right) Start po iron Keep in ICU for liver failure   LOS: 3 days    Tharon Aquas Trigt III 03/26/2017

## 2017-03-27 ENCOUNTER — Inpatient Hospital Stay (HOSPITAL_COMMUNITY): Payer: 59

## 2017-03-27 LAB — BPAM RBC
BLOOD PRODUCT EXPIRATION DATE: 201902152359
BLOOD PRODUCT EXPIRATION DATE: 201902152359
Blood Product Expiration Date: 201902152359
Blood Product Expiration Date: 201902152359
ISSUE DATE / TIME: 201901290954
ISSUE DATE / TIME: 201901290954
UNIT TYPE AND RH: 6200
Unit Type and Rh: 6200
Unit Type and Rh: 6200
Unit Type and Rh: 6200

## 2017-03-27 LAB — TYPE AND SCREEN
ABO/RH(D): A POS
ANTIBODY SCREEN: NEGATIVE
UNIT DIVISION: 0
UNIT DIVISION: 0
Unit division: 0
Unit division: 0

## 2017-03-27 LAB — CBC
HCT: 23.6 % — ABNORMAL LOW (ref 36.0–46.0)
Hemoglobin: 7.7 g/dL — ABNORMAL LOW (ref 12.0–15.0)
MCH: 33 pg (ref 26.0–34.0)
MCHC: 32.6 g/dL (ref 30.0–36.0)
MCV: 101.3 fL — ABNORMAL HIGH (ref 78.0–100.0)
Platelets: 128 10*3/uL — ABNORMAL LOW (ref 150–400)
RBC: 2.33 MIL/uL — ABNORMAL LOW (ref 3.87–5.11)
RDW: 15.9 % — ABNORMAL HIGH (ref 11.5–15.5)
WBC: 8.1 10*3/uL (ref 4.0–10.5)

## 2017-03-27 LAB — GLUCOSE, CAPILLARY
GLUCOSE-CAPILLARY: 99 mg/dL (ref 65–99)
GLUCOSE-CAPILLARY: 116 mg/dL — AB (ref 65–99)
Glucose-Capillary: 120 mg/dL — ABNORMAL HIGH (ref 65–99)
Glucose-Capillary: 107 mg/dL — ABNORMAL HIGH (ref 65–99)

## 2017-03-27 LAB — COMPREHENSIVE METABOLIC PANEL
ALT: 62 U/L — ABNORMAL HIGH (ref 14–54)
AST: 91 U/L — ABNORMAL HIGH (ref 15–41)
Albumin: 3.6 g/dL (ref 3.5–5.0)
Alkaline Phosphatase: 108 U/L (ref 38–126)
Anion gap: 13 (ref 5–15)
BUN: 17 mg/dL (ref 6–20)
CO2: 21 mmol/L — ABNORMAL LOW (ref 22–32)
Calcium: 8.9 mg/dL (ref 8.9–10.3)
Chloride: 103 mmol/L (ref 101–111)
Creatinine, Ser: 0.67 mg/dL (ref 0.44–1.00)
GFR calc Af Amer: >60 mL/min (ref >60)
GFR calc non Af Amer: >60 mL/min (ref >60)
Glucose, Bld: 102 mg/dL — ABNORMAL HIGH (ref 65–99)
Potassium: 4.1 mmol/L (ref 3.5–5.1)
Sodium: 137 mmol/L (ref 135–145)
Total Bilirubin: 10 mg/dL — ABNORMAL HIGH (ref 0.3–1.2)
Total Protein: 6.5 g/dL (ref 6.5–8.1)

## 2017-03-27 LAB — POCT I-STAT, CHEM 8
BUN: 16 mg/dL (ref 6–20)
CREATININE: 0.6 mg/dL (ref 0.44–1.00)
Calcium, Ion: 1.1 mmol/L — ABNORMAL LOW (ref 1.15–1.40)
Chloride: 99 mmol/L — ABNORMAL LOW (ref 101–111)
GLUCOSE: 114 mg/dL — AB (ref 65–99)
HEMATOCRIT: 26 % — AB (ref 36.0–46.0)
HEMOGLOBIN: 8.8 g/dL — AB (ref 12.0–15.0)
POTASSIUM: 3.8 mmol/L (ref 3.5–5.1)
Sodium: 138 mmol/L (ref 135–145)
TCO2: 28 mmol/L (ref 22–32)

## 2017-03-27 LAB — PREPARE RBC (CROSSMATCH)

## 2017-03-27 MED ORDER — CALCIUM CARBONATE ANTACID 500 MG PO CHEW
1.0000 | CHEWABLE_TABLET | Freq: Once | ORAL | Status: AC
  Administered 2017-03-27: 200 mg via ORAL
  Filled 2017-03-27: qty 1

## 2017-03-27 MED ORDER — SODIUM CHLORIDE 0.9% FLUSH
10.0000 mL | Freq: Two times a day (BID) | INTRAVENOUS | Status: DC
  Administered 2017-03-27: 10 mL
  Administered 2017-03-27: 20 mL
  Administered 2017-03-28: 10 mL
  Administered 2017-03-28: 20 mL
  Administered 2017-03-29 (×2): 10 mL
  Administered 2017-03-30: 20 mL
  Administered 2017-03-30 – 2017-04-01 (×4): 10 mL

## 2017-03-27 MED ORDER — KETOROLAC TROMETHAMINE 15 MG/ML IJ SOLN
15.0000 mg | Freq: Four times a day (QID) | INTRAMUSCULAR | Status: AC
  Administered 2017-03-27 – 2017-03-29 (×8): 15 mg via INTRAVENOUS
  Filled 2017-03-27 (×8): qty 1

## 2017-03-27 MED ORDER — DEXTROSE 5 % IV SOLN
1.0000 g | Freq: Three times a day (TID) | INTRAVENOUS | Status: DC
  Administered 2017-03-27 – 2017-04-01 (×16): 1 g via INTRAVENOUS
  Filled 2017-03-27 (×18): qty 1

## 2017-03-27 MED ORDER — SODIUM CHLORIDE 0.9% FLUSH
10.0000 mL | INTRAVENOUS | Status: DC | PRN
  Administered 2017-04-02 (×2): 10 mL
  Filled 2017-03-27 (×2): qty 40

## 2017-03-27 MED ORDER — FUROSEMIDE 10 MG/ML IJ SOLN
40.0000 mg | Freq: Two times a day (BID) | INTRAMUSCULAR | Status: DC
  Administered 2017-03-27 – 2017-03-29 (×5): 40 mg via INTRAVENOUS
  Filled 2017-03-27 (×5): qty 4

## 2017-03-27 MED ORDER — POTASSIUM CHLORIDE 10 MEQ/50ML IV SOLN
10.0000 meq | INTRAVENOUS | Status: DC | PRN
  Administered 2017-03-27 (×2): 10 meq via INTRAVENOUS
  Filled 2017-03-27 (×4): qty 50

## 2017-03-27 MED ORDER — CHLORHEXIDINE GLUCONATE CLOTH 2 % EX PADS
6.0000 | MEDICATED_PAD | Freq: Every day | CUTANEOUS | Status: DC
  Administered 2017-03-27 – 2017-04-01 (×5): 6 via TOPICAL

## 2017-03-27 NOTE — Progress Notes (Signed)
3 Days Post-Op Procedure(s) (LRB): CORONARY ARTERY BYPASS GRAFTING (CABG) x two, using left internal mammary artery, left radial artery, and right leg greater saphenous vein harvested endoscopically (N/A) TRANSESOPHAGEAL ECHOCARDIOGRAM (TEE) (N/A) RADIAL ARTERY HARVEST (Left) ENDOVEIN HARVEST OF GREATER SAPHENOUS VEIN (Right)  Patient feels better after sleeping better last night No nausea, positive flatus Bilirubin of up to 10.0 today without increase in alkaline phosphatase\ Hemoglobin dropped to 7.7 [expected postop blood loss anemia]-we'll transfer 1 unit of blood to optimize hepatic function and recovery Chest x-ray shows interstitial edema, weight stable but still up from preop. Increase Lasix to 40 IV twice a day Keep in ICU until patient shows evidence of hepatic recovery rolling urgent CABG  Objective: Vital signs in last 24 hours: Temp:  [98.1 F (36.7 C)-98.8 F (37.1 C)] 98.1 F (36.7 C) (02/01 0700) Pulse Rate:  [95-116] 103 (02/01 0700) Cardiac Rhythm: Sinus tachycardia (02/01 0400) Resp:  [15-27] 18 (02/01 0700) BP: (88-113)/(54-72) 97/61 (02/01 0700) SpO2:  [95 %-100 %] 96 % (02/01 0700) Weight:  [175 lb 4.3 oz (79.5 kg)] 175 lb 4.3 oz (79.5 kg) (02/01 0500)  Hemodynamic parameters for last 24 hours:    Intake/Output from previous day: 01/31 0701 - 02/01 0700 In: 1240 [P.O.:800; I.V.:240; IV Piggyback:200] Out: 1224 [Urine:1125; Chest Tube:60] Intake/Output this shift: No intake/output data recorded.       Exam    General- alert and comfortable. Notably jaundiced    Neck- no JVD, no cervical adenopathy palpable, no carotid bruit   Lungs- clear without rales, wheezes   Cor- regular rate and rhythm, no murmur , gallop   Abdomen- soft, non-tender   Extremities - warm, non-tender, minimal edema   Neuro- oriented, appropriate, no focal weakness   Lab Results: Recent Labs    03/26/17 0352 03/27/17 0446  WBC 9.0 8.1  HGB 7.8* 7.7*  HCT 24.4* 23.6*  PLT  136* 128*   BMET:  Recent Labs    03/26/17 0352 03/27/17 0446  NA 138 137  K 3.8 4.1  CL 105 103  CO2 23 21*  GLUCOSE 111* 102*  BUN 14 17  CREATININE 0.59 0.67  CALCIUM 8.4* 8.9    PT/INR:  Recent Labs    03/24/17 1430  LABPROT 16.3*  INR 1.32   ABG    Component Value Date/Time   PHART 7.362 03/25/2017 0423   HCO3 21.7 03/25/2017 0423   TCO2 26 03/25/2017 1755   ACIDBASEDEF 3.0 (H) 03/25/2017 0423   O2SAT 95.0 03/25/2017 0423   CBG (last 3)  Recent Labs    03/26/17 2345 03/27/17 0344 03/27/17 0725  GLUCAP 106* 99 120*    Assessment/Plan: S/P Procedure(s) (LRB): CORONARY ARTERY BYPASS GRAFTING (CABG) x two, using left internal mammary artery, left radial artery, and right leg greater saphenous vein harvested endoscopically (N/A) TRANSESOPHAGEAL ECHOCARDIOGRAM (TEE) (N/A) RADIAL ARTERY HARVEST (Left) ENDOVEIN HARVEST OF GREATER SAPHENOUS VEIN (Right) Mobilize Diuresis Keep in ICU for hepatic insufficiency postop urgent CABG   LOS: 4 days    Tharon Aquas Trigt III 03/27/2017

## 2017-03-27 NOTE — Progress Notes (Signed)
Patient ID: Christine Butler, female   DOB: 11-29-1970, 47 y.o.   MRN: [id number] T[facility] Evening Rounds:  Hemodynamically stable  sats 92% on 1 L  Urine output ok  BMET    Component Value Date/Time   NA 138 03/27/2017 1751   NA 139 03/16/2017 1015   K 3.8 03/27/2017 1751   CL 99 (L) 03/27/2017 1751   CO2 21 (L) 03/27/2017 0446   GLUCOSE 114 (H) 03/27/2017 1751   BUN 16 03/27/2017 1751   BUN 8 03/16/2017 1015   CREATININE 0.60 03/27/2017 1751   CALCIUM 8.9 03/27/2017 0446   GFRNONAA >60 03/27/2017 0446   GFRAA >60 03/27/2017 0446    CBC    Component Value Date/Time   WBC 8.1 03/27/2017 0446   RBC 2.33 (L) 03/27/2017 0446   HGB 8.8 (L) 03/27/2017 1751   HGB 13.4 03/16/2017 1015   HCT 26.0 (L) 03/27/2017 1751   HCT 39.0 03/16/2017 1015   PLT 128 (L) 03/27/2017 0446   PLT 235 03/16/2017 1015   MCV 101.3 (H) 03/27/2017 0446   MCV 94 03/16/2017 1015   MCH 33.0 03/27/2017 0446   MCHC 32.6 03/27/2017 0446   RDW 15.9 (H) 03/27/2017 0446   RDW 16.4 (H) 03/16/2017 1015   LYMPHSABS 1.2 03/16/2017 1015   MONOABS 0.7 11/07/2016 0105   EOSABS 0.1 03/16/2017 1015   BASOSABS 0.0 03/16/2017 1015

## 2017-03-27 NOTE — Progress Notes (Signed)
Peripherally Inserted Central Catheter/Midline Placement  The IV Nurse has discussed with the patient and/or persons authorized to consent for the patient, the purpose of this procedure and the potential benefits and risks involved with this procedure.  The benefits include less needle sticks, lab draws from the catheter, and the patient may be discharged home with the catheter. Risks include, but not limited to, infection, bleeding, blood clot (thrombus formation), and puncture of an artery; nerve damage and irregular heartbeat and possibility to perform a PICC exchange if needed/ordered by physician.  Alternatives to this procedure were also discussed.  Bard Power PICC patient education guide, fact sheet on infection prevention and patient information card has been provided to patient /or left at bedside.    PICC/Midline Placement Documentation  PICC Double Lumen 14/48/18 PICC Right Basilic 40 cm 0 cm (Active)  Indication for Insertion or Continuance of Line Poor Vasculature-patient has had multiple peripheral attempts or PIVs lasting less than 24 hours 03/27/2017 11:00 AM  Exposed Catheter (cm) 0 cm 03/27/2017 11:00 AM  Site Assessment Clean;Dry;Intact 03/27/2017 11:00 AM  Lumen #1 Status Flushed;Blood return noted 03/27/2017 11:00 AM  Lumen #2 Status Flushed;Blood return noted 03/27/2017 11:00 AM  Dressing Type Transparent 03/27/2017 11:00 AM  Dressing Status Clean;Dry;Intact;Antimicrobial disc in place 03/27/2017 11:00 AM  Dressing Intervention New dressing 03/27/2017 11:00 AM  Dressing Change Due 04/03/17 03/27/2017 11:00 AM       Christella Noa Albarece 03/27/2017, 11:46 AM

## 2017-03-28 ENCOUNTER — Inpatient Hospital Stay (HOSPITAL_COMMUNITY): Payer: 59

## 2017-03-28 LAB — TYPE AND SCREEN
ABO/RH(D): A POS
Antibody Screen: NEGATIVE
Unit division: 0

## 2017-03-28 LAB — COMPREHENSIVE METABOLIC PANEL
ALT: 61 U/L — ABNORMAL HIGH (ref 14–54)
AST: 76 U/L — ABNORMAL HIGH (ref 15–41)
Albumin: 3.2 g/dL — ABNORMAL LOW (ref 3.5–5.0)
Alkaline Phosphatase: 113 U/L (ref 38–126)
Anion gap: 11 (ref 5–15)
BUN: 14 mg/dL (ref 6–20)
CO2: 27 mmol/L (ref 22–32)
Calcium: 8.8 mg/dL — ABNORMAL LOW (ref 8.9–10.3)
Chloride: 100 mmol/L — ABNORMAL LOW (ref 101–111)
Creatinine, Ser: 0.62 mg/dL (ref 0.44–1.00)
GFR calc Af Amer: >60 mL/min (ref >60)
GFR calc non Af Amer: >60 mL/min (ref >60)
Glucose, Bld: 106 mg/dL — ABNORMAL HIGH (ref 65–99)
Potassium: 3.9 mmol/L (ref 3.5–5.1)
Sodium: 138 mmol/L (ref 135–145)
Total Bilirubin: 11.9 mg/dL — ABNORMAL HIGH (ref 0.3–1.2)
Total Protein: 6.4 g/dL — ABNORMAL LOW (ref 6.5–8.1)

## 2017-03-28 LAB — GLUCOSE, CAPILLARY
GLUCOSE-CAPILLARY: 127 mg/dL — AB (ref 65–99)
Glucose-Capillary: 94 mg/dL (ref 65–99)

## 2017-03-28 LAB — CBC
HCT: 25.8 % — ABNORMAL LOW (ref 36.0–46.0)
Hemoglobin: 8.6 g/dL — ABNORMAL LOW (ref 12.0–15.0)
MCH: 33.1 pg (ref 26.0–34.0)
MCHC: 33.3 g/dL (ref 30.0–36.0)
MCV: 99.2 fL (ref 78.0–100.0)
Platelets: 156 10*3/uL (ref 150–400)
RBC: 2.6 MIL/uL — ABNORMAL LOW (ref 3.87–5.11)
RDW: 17.2 % — ABNORMAL HIGH (ref 11.5–15.5)
WBC: 8.4 10*3/uL (ref 4.0–10.5)

## 2017-03-28 LAB — BPAM RBC
Blood Product Expiration Date: 201902162359
ISSUE DATE / TIME: 201902011152
Unit Type and Rh: 6200

## 2017-03-28 NOTE — Progress Notes (Signed)
4 Days Post-Op Procedure(s) (LRB): CORONARY ARTERY BYPASS GRAFTING (CABG) x two, using left internal mammary artery, left radial artery, and right leg greater saphenous vein harvested endoscopically (N/A) TRANSESOPHAGEAL ECHOCARDIOGRAM (TEE) (N/A) RADIAL ARTERY HARVEST (Left) ENDOVEIN HARVEST OF GREATER SAPHENOUS VEIN (Right) Subjective: No specific complaints. Only concerned about her bilirubin going up.  Objective: Vital signs in last 24 hours: Temp:  [97.8 F (36.6 C)-98.2 F (36.8 C)] 98.2 F (36.8 C) (02/02 1207) Pulse Rate:  [88-111] 95 (02/02 1400) Cardiac Rhythm: Normal sinus rhythm (02/02 1200) Resp:  [14-30] 22 (02/02 1400) BP: (93-118)/(54-78) 101/61 (02/02 1400) SpO2:  [90 %-100 %] 98 % (02/02 1400) Weight:  [79.1 kg (174 lb 6.1 oz)] 79.1 kg (174 lb 6.1 oz) (02/02 0500)  Hemodynamic parameters for last 24 hours:    Intake/Output from previous day: 02/01 0701 - 02/02 0700 In: 7628 [P.O.:720; I.V.:110; Blood:315; IV Piggyback:250] Out: 2010 [Urine:2010] Intake/Output this shift: Total I/O In: 530 [P.O.:480; IV Piggyback:50] Out: 825 [Urine:825]  General appearance: alert and cooperative, jaundiced Heart: regular rate and rhythm, S1, S2 normal, no murmur, click, rub or gallop Lungs: clear to auscultation bilaterally Extremities: edema mild Wound: incision ok  Lab Results: Recent Labs    03/27/17 0446 03/27/17 1751 03/28/17 0410  WBC 8.1  --  8.4  HGB 7.7* 8.8* 8.6*  HCT 23.6* 26.0* 25.8*  PLT 128*  --  156   BMET:  Recent Labs    03/27/17 0446 03/27/17 1751 03/28/17 0410  NA 137 138 138  K 4.1 3.8 3.9  CL 103 99* 100*  CO2 21*  --  27  GLUCOSE 102* 114* 106*  BUN 17 16 14   CREATININE 0.67 0.60 0.62  CALCIUM 8.9  --  8.8*    PT/INR: No results for input(s): LABPROT, INR in the last 72 hours. ABG    Component Value Date/Time   PHART 7.362 03/25/2017 0423   HCO3 21.7 03/25/2017 0423   TCO2 28 03/27/2017 1751   ACIDBASEDEF 3.0 (H)  03/25/2017 0423   O2SAT 95.0 03/25/2017 0423   CBG (last 3)  Recent Labs    03/27/17 1945 03/28/17 0019 03/28/17 0350  GLUCAP 107* 127* 94   CLINICAL DATA:  History of CABG  EXAM: CHEST  2 VIEW  COMPARISON:  March 27, 2017  FINDINGS: A small left effusion with underlying opacity is identified. A new left PICC line terminates just within the right side of the heart, likely 1.3 cm below the caval atrial junction. Patchy bilateral pulmonary infiltrates are identified, centered in the perihilar regions. The cardiomediastinal silhouette is stable. No other changes.  IMPRESSION: 1. There is a new left PICC line which terminates just within the right side of the heart, approximately 1.3 cm below the caval atrial junction. 2. Patchy bilateral infiltrate. These findings are worsened on the right and similar on the left in the interval. The patchy appearance is concerning for multifocal pneumonia. Asymmetric edema is a possibility as well.   Electronically Signed   By: Dorise Bullion III M.D   On: 03/28/2017 07:33   Assessment/Plan: S/P Procedure(s) (LRB): CORONARY ARTERY BYPASS GRAFTING (CABG) x two, using left internal mammary artery, left radial artery, and right leg greater saphenous vein harvested endoscopically (N/A) TRANSESOPHAGEAL ECHOCARDIOGRAM (TEE) (N/A) RADIAL ARTERY HARVEST (Left) ENDOVEIN HARVEST OF GREATER SAPHENOUS VEIN (Right)  She is hemodynamically stable in sinus rhythm.  Total bili up from 10.0 yesteraday to 11.9 today. Alk Phos normal and transaminases down further. Continue to follow.  Volume excess: she is diuresing but weight is 11 lbs over preop and CXR shows some edema. Continue diuresis slowly since BP is low normal.  Continue ambulation and IS.   LOS: 5 days    Gaye Pollack 03/28/2017

## 2017-03-29 LAB — COMPREHENSIVE METABOLIC PANEL
ALT: 62 U/L — ABNORMAL HIGH (ref 14–54)
AST: 76 U/L — ABNORMAL HIGH (ref 15–41)
Albumin: 3 g/dL — ABNORMAL LOW (ref 3.5–5.0)
Alkaline Phosphatase: 122 U/L (ref 38–126)
Anion gap: 12 (ref 5–15)
BUN: 10 mg/dL (ref 6–20)
CO2: 27 mmol/L (ref 22–32)
Calcium: 8.8 mg/dL — ABNORMAL LOW (ref 8.9–10.3)
Chloride: 97 mmol/L — ABNORMAL LOW (ref 101–111)
Creatinine, Ser: 0.67 mg/dL (ref 0.44–1.00)
GFR calc Af Amer: >60 mL/min (ref >60)
GFR calc non Af Amer: >60 mL/min (ref >60)
Glucose, Bld: 114 mg/dL — ABNORMAL HIGH (ref 65–99)
Potassium: 3.6 mmol/L (ref 3.5–5.1)
Sodium: 136 mmol/L (ref 135–145)
Total Bilirubin: 13.1 mg/dL — ABNORMAL HIGH (ref 0.3–1.2)
Total Protein: 6.2 g/dL — ABNORMAL LOW (ref 6.5–8.1)

## 2017-03-29 LAB — GLUCOSE, CAPILLARY
GLUCOSE-CAPILLARY: 127 mg/dL — AB (ref 65–99)
Glucose-Capillary: 101 mg/dL — ABNORMAL HIGH (ref 65–99)
Glucose-Capillary: 122 mg/dL — ABNORMAL HIGH (ref 65–99)
Glucose-Capillary: 107 mg/dL — ABNORMAL HIGH (ref 65–99)

## 2017-03-29 LAB — CBC
HCT: 25.2 % — ABNORMAL LOW (ref 36.0–46.0)
Hemoglobin: 8.4 g/dL — ABNORMAL LOW (ref 12.0–15.0)
MCH: 32.8 pg (ref 26.0–34.0)
MCHC: 33.3 g/dL (ref 30.0–36.0)
MCV: 98.4 fL (ref 78.0–100.0)
Platelets: 172 10*3/uL (ref 150–400)
RBC: 2.56 MIL/uL — ABNORMAL LOW (ref 3.87–5.11)
RDW: 16.8 % — ABNORMAL HIGH (ref 11.5–15.5)
WBC: 8.9 10*3/uL (ref 4.0–10.5)

## 2017-03-29 MED ORDER — POTASSIUM CHLORIDE CRYS ER 20 MEQ PO TBCR
40.0000 meq | EXTENDED_RELEASE_TABLET | Freq: Once | ORAL | Status: AC
  Administered 2017-03-29: 40 meq via ORAL
  Filled 2017-03-29: qty 2

## 2017-03-29 MED ORDER — POTASSIUM CHLORIDE CRYS ER 20 MEQ PO TBCR
20.0000 meq | EXTENDED_RELEASE_TABLET | Freq: Two times a day (BID) | ORAL | Status: DC
  Administered 2017-03-29 – 2017-04-03 (×10): 20 meq via ORAL
  Filled 2017-03-29 (×10): qty 1

## 2017-03-29 MED ORDER — FUROSEMIDE 40 MG PO TABS
40.0000 mg | ORAL_TABLET | Freq: Every day | ORAL | Status: DC
  Administered 2017-03-30 – 2017-03-31 (×2): 40 mg via ORAL
  Filled 2017-03-29 (×2): qty 1

## 2017-03-29 NOTE — Plan of Care (Signed)
Making progress, needs to continue to reinforce importance of vigorous pulmonary toilet. IS 600- diuresed today. Bili still elevated.

## 2017-03-29 NOTE — Progress Notes (Signed)
5 Days Post-Op Procedure(s) (LRB): CORONARY ARTERY BYPASS GRAFTING (CABG) x two, using left internal mammary artery, left radial artery, and right leg greater saphenous vein harvested endoscopically (N/A) TRANSESOPHAGEAL ECHOCARDIOGRAM (TEE) (N/A) RADIAL ARTERY HARVEST (Left) ENDOVEIN HARVEST OF GREATER SAPHENOUS VEIN (Right) Subjective: Breathing is better. Some incisional pain and pain behind shoulder  Objective: Vital signs in last 24 hours: Temp:  [98.1 F (36.7 C)-98.6 F (37 C)] 98.6 F (37 C) (02/03 0756) Pulse Rate:  [65-112] 106 (02/03 0900) Cardiac Rhythm: Sinus tachycardia (02/03 0800) Resp:  [15-31] 18 (02/03 0900) BP: (88-123)/(53-77) 110/68 (02/03 0900) SpO2:  [93 %-99 %] 95 % (02/03 0900) Weight:  [72.4 kg (159 lb 9.8 oz)] 72.4 kg (159 lb 9.8 oz) (02/03 0500)  Hemodynamic parameters for last 24 hours:    Intake/Output from previous day: 02/02 0701 - 02/03 0700 In: 1500 [P.O.:1350; IV Piggyback:150] Out: 2075 [Urine:2075] Intake/Output this shift: Total I/O In: 120 [P.O.:120] Out: 100 [Urine:100]  General appearance: alert and cooperative Heart: regular rate and rhythm, S1, S2 normal, no murmur, click, rub or gallop Lungs: diminished breath sounds bibasilar Extremities: extremities normal, atraumatic, no cyanosis or edema Wound: incisions ok  Lab Results: Recent Labs    03/28/17 0410 03/29/17 0339  WBC 8.4 8.9  HGB 8.6* 8.4*  HCT 25.8* 25.2*  PLT 156 172   BMET:  Recent Labs    03/28/17 0410 03/29/17 0339  NA 138 136  K 3.9 3.6  CL 100* 97*  CO2 27 27  GLUCOSE 106* 114*  BUN 14 10  CREATININE 0.62 0.67  CALCIUM 8.8* 8.8*    PT/INR: No results for input(s): LABPROT, INR in the last 72 hours. ABG    Component Value Date/Time   PHART 7.362 03/25/2017 0423   HCO3 21.7 03/25/2017 0423   TCO2 28 03/27/2017 1751   ACIDBASEDEF 3.0 (H) 03/25/2017 0423   O2SAT 95.0 03/25/2017 0423   CBG (last 3)  Recent Labs    03/27/17 1945  03/28/17 0019 03/28/17 0350  GLUCAP 107* 127* 70    Assessment/Plan: S/P Procedure(s) (LRB): CORONARY ARTERY BYPASS GRAFTING (CABG) x two, using left internal mammary artery, left radial artery, and right leg greater saphenous vein harvested endoscopically (N/A) TRANSESOPHAGEAL ECHOCARDIOGRAM (TEE) (N/A) RADIAL ARTERY HARVEST (Left) ENDOVEIN HARVEST OF GREATER SAPHENOUS VEIN (Right)  Hemodynamically stable in sinus rhythm  Volume excess continues to improve with diuresis. Weight is only 3 lbs over preop. Will switch lasix to po.  Total bili slightly higher at 13 but Alk Phos remains normal. Transaminases stable. On Villalba for biliary cirrhosis.  Continue IS and ambulation  Repeat CXR in am.   LOS: 6 days    Christine Butler 03/29/2017

## 2017-03-30 ENCOUNTER — Ambulatory Visit: Payer: 59 | Admitting: Cardiology

## 2017-03-30 ENCOUNTER — Inpatient Hospital Stay (HOSPITAL_COMMUNITY): Payer: 59

## 2017-03-30 DIAGNOSIS — Z951 Presence of aortocoronary bypass graft: Secondary | ICD-10-CM

## 2017-03-30 LAB — COMPREHENSIVE METABOLIC PANEL
ALT: 67 U/L — AB (ref 14–54)
AST: 83 U/L — AB (ref 15–41)
Albumin: 2.8 g/dL — ABNORMAL LOW (ref 3.5–5.0)
Alkaline Phosphatase: 153 U/L — ABNORMAL HIGH (ref 38–126)
Anion gap: 10 (ref 5–15)
BUN: 9 mg/dL (ref 6–20)
CHLORIDE: 95 mmol/L — AB (ref 101–111)
CO2: 29 mmol/L (ref 22–32)
CREATININE: 0.54 mg/dL (ref 0.44–1.00)
Calcium: 8.8 mg/dL — ABNORMAL LOW (ref 8.9–10.3)
GFR calc Af Amer: >60 mL/min (ref >60)
GFR calc non Af Amer: >60 mL/min (ref >60)
Glucose, Bld: 113 mg/dL — ABNORMAL HIGH (ref 65–99)
Potassium: 4 mmol/L (ref 3.5–5.1)
SODIUM: 134 mmol/L — AB (ref 135–145)
Total Bilirubin: 14.3 mg/dL — ABNORMAL HIGH (ref 0.3–1.2)
Total Protein: 6.2 g/dL — ABNORMAL LOW (ref 6.5–8.1)

## 2017-03-30 LAB — PROTIME-INR
INR: 1.23
PROTHROMBIN TIME: 15.4 s — AB (ref 11.4–15.2)

## 2017-03-30 LAB — GLUCOSE, CAPILLARY
GLUCOSE-CAPILLARY: 114 mg/dL — AB (ref 65–99)
GLUCOSE-CAPILLARY: 146 mg/dL — AB (ref 65–99)
Glucose-Capillary: 103 mg/dL — ABNORMAL HIGH (ref 65–99)
Glucose-Capillary: 119 mg/dL — ABNORMAL HIGH (ref 65–99)
Glucose-Capillary: 104 mg/dL — ABNORMAL HIGH (ref 65–99)

## 2017-03-30 LAB — CBC
HCT: 26 % — ABNORMAL LOW (ref 36.0–46.0)
Hemoglobin: 8.6 g/dL — ABNORMAL LOW (ref 12.0–15.0)
MCH: 32.3 pg (ref 26.0–34.0)
MCHC: 33.1 g/dL (ref 30.0–36.0)
MCV: 97.7 fL (ref 78.0–100.0)
Platelets: 202 10*3/uL (ref 150–400)
RBC: 2.66 MIL/uL — ABNORMAL LOW (ref 3.87–5.11)
RDW: 16.4 % — ABNORMAL HIGH (ref 11.5–15.5)
WBC: 9.3 10*3/uL (ref 4.0–10.5)

## 2017-03-30 LAB — AMMONIA: AMMONIA: 36 umol/L — AB (ref 9–35)

## 2017-03-30 LAB — IRON AND TIBC
Iron: 36 ug/dL (ref 28–170)
SATURATION RATIOS: 16 % (ref 10.4–31.8)
TIBC: 221 ug/dL — ABNORMAL LOW (ref 250–450)
UIBC: 185 ug/dL

## 2017-03-30 LAB — FERRITIN: Ferritin: 313 ng/mL — ABNORMAL HIGH (ref 11–307)

## 2017-03-30 MED ORDER — METOLAZONE 5 MG PO TABS
5.0000 mg | ORAL_TABLET | Freq: Once | ORAL | Status: AC
  Administered 2017-03-30: 5 mg via ORAL
  Filled 2017-03-30: qty 1

## 2017-03-30 MED ORDER — PROMETHAZINE HCL 25 MG/ML IJ SOLN
12.5000 mg | Freq: Four times a day (QID) | INTRAMUSCULAR | Status: DC | PRN
  Administered 2017-03-30: 12.5 mg via INTRAVENOUS
  Filled 2017-03-30 (×2): qty 1

## 2017-03-30 MED ORDER — ALPRAZOLAM 0.25 MG PO TABS
0.2500 mg | ORAL_TABLET | Freq: Two times a day (BID) | ORAL | Status: DC | PRN
  Administered 2017-03-30 – 2017-04-02 (×6): 0.25 mg via ORAL
  Filled 2017-03-30 (×6): qty 1

## 2017-03-30 MED ORDER — LACTULOSE 10 GM/15ML PO SOLN
20.0000 g | Freq: Every day | ORAL | Status: DC
  Administered 2017-03-30 – 2017-03-31 (×2): 20 g via ORAL
  Filled 2017-03-30 (×2): qty 30

## 2017-03-30 NOTE — Progress Notes (Signed)
Per IV team ok to use picc line placement. Consult was sent this am based on CXR this am. Will continue to monitor.

## 2017-03-30 NOTE — Progress Notes (Signed)
Pt feeling nauseated after ambulating to bathroom after lunch. Pt requesting something else for nausea PA paged and made aware. New order given. Pt given ice chips and gingerale per request. Pt requested to wait on taking the medication at this time. Will continue to monitor. pts parents at bedside.

## 2017-03-30 NOTE — Progress Notes (Signed)
Patient ID: Christine Butler, female   DOB: Jun 04, 1970, 47 y.o.   MRN: [id number] EVENING ROUNDS NOTE :     [address]       ,[place] [address]             [phone]                 6 Days Post-Op Procedure(s) (LRB): CORONARY ARTERY BYPASS GRAFTING (CABG) x two, using left internal mammary artery, left radial artery, and right leg greater saphenous vein harvested endoscopically (N/A) TRANSESOPHAGEAL ECHOCARDIOGRAM (TEE) (N/A) RADIAL ARTERY HARVEST (Left) ENDOVEIN HARVEST OF GREATER SAPHENOUS VEIN (Right)  Total Length of Stay:  LOS: 7 days  BP (!) 97/52 (BP Location: Right Arm)   Pulse 96   Temp 98.1 F (36.7 C) (Oral)   Resp 18   Ht 5\' 2"  (1.575 m)   Wt 172 lb 9.6 oz (78.3 kg)   LMP 03/12/2017 Comment: pt shielded  SpO2 98%   BMI 31.57 kg/m   .Intake/Output      02/04 0701 - 02/05 0700   P.O. 480   I.V. (mL/kg) 0 (0)   IV Piggyback 50   Total Intake(mL/kg) 530 (6.8)   Urine (mL/kg/hr) 1550 (1.5)   Total Output 1550   Net -1020         . sodium chloride Stopped (03/25/17 1900)  . sodium chloride    . sodium chloride Stopped (03/25/17 1023)  . cefTAZidime (FORTAZ)  IV Stopped (03/30/17 1200)  . lactated ringers 10 mL/hr at 03/28/17 0600  . potassium chloride Stopped (03/27/17 1946)     Lab Results  Component Value Date   WBC 9.3 03/30/2017   HGB 8.6 (L) 03/30/2017   HCT 26.0 (L) 03/30/2017   PLT 202 03/30/2017   GLUCOSE 113 (H) 03/30/2017   CHOL 406 (H) 03/23/2017   TRIG 309 (H) 03/23/2017   HDL 10 (L) 03/23/2017   LDLCALC 334 (H) 03/23/2017   ALT 67 (H) 03/30/2017   AST 83 (H) 03/30/2017   NA 134 (L) 03/30/2017   K 4.0 03/30/2017   CL 95 (L) 03/30/2017   CREATININE 0.54 03/30/2017   BUN 9 03/30/2017   CO2 29 03/30/2017   INR 1.23 03/30/2017   Stable day , up in chair tonight   Grace Isaac MD  Beeper [phone] Office [phone] 03/30/2017 8:10 PM

## 2017-03-30 NOTE — Consult Note (Addendum)
[facility] Consult  Referring Provider: Ivin Poot, MD Primary Care Physician:  Patient, No Pcp Per Primary Gastroenterologist: [facility]/Dr.Edwards  Reason for Consultation:  Abnormal liver enzymes, history of Primary Biliary Cholangitis  HPI: Christine Butler is a 47 y.o. female who underwent a CABG X 2 and radial artery  grafting and right greater saphaneous vein harvesting on 03/24/17.  She was seen recently as an outpatient and underwent an endoscopy on 01/21/17 for unexplained chest pain, EGD was basically unremarkable, however, cardiac catheterization revealed 95-99% stenosis of LAD diagonal bifurcation.  Patient was diagnosed with [place] in 2005, AMA was positive, subsequent liver biopsy confirmed PBC and she has been maintained on ursodiol 900 mg in a.m. and 600 mg in p.m.Marland Kitchen She has also been seen at [facility],  hepatology department, last appointment in 2014. 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  Since her surgery, patient has been on Ceftazidime 1 g every 8 hours. Patient denies heavy alcohol use, drinks 2-3 glasses of wine every week. She denies recent travel, use of over-the-counter medication or herbal remedies. She denies recent travel or sick contacts.  She had some nausea which is now resolved, denies vomiting, reports 1 bowel movement since admission but denies abdominal pain, fever, chills or rigors. She has noticed yellowish discoloration of IVs for the past one week which has progressively worsened along with dark urine but reports normal colored stools.  She was on a hormone replacement patch which was discontinued in December. Has been on trimethoprim as an outpatient since 02/10/17.    Past Medical History:  Diagnosis Date  . Arthritis    right knee  . GERD (gastroesophageal reflux disease)   . IBS (irritable bowel syndrome)   . PONV (postoperative nausea  and vomiting)   . Primary biliary cholangitis ([place])   . Primary biliary cirrhosis ([place])   . SVD (spontaneous vaginal delivery)    x 3  . Urinary tract bacterial infections     Past Surgical History:  Procedure Laterality Date  . CORONARY ARTERY BYPASS GRAFT N/A 03/24/2017   Procedure: CORONARY ARTERY BYPASS GRAFTING (CABG) x two, using left internal mammary artery, left radial artery, and right leg greater saphenous vein harvested endoscopically;  Surgeon: Ivin Poot, MD;  Location: [facility];  Service: Open Heart Surgery;  Laterality: N/A;  . ENDOVEIN HARVEST OF GREATER SAPHENOUS VEIN Right 03/24/2017   Procedure: ENDOVEIN HARVEST OF GREATER SAPHENOUS VEIN;  Surgeon: Ivin Poot, MD;  Location: [facility];  Service: Open Heart Surgery;  Laterality: Right;  . INCONTINENCE SURGERY    . LEEP N/A 03/28/2014   Procedure: LOOP ELECTROSURGICAL EXCISION PROCEDURE (LEEP) cone biopsy;  Surgeon: Cyril Mourning, MD;  Location: [facility];  Service: Gynecology;  Laterality: N/A;  . LEFT HEART CATH AND CORONARY ANGIOGRAPHY N/A 03/23/2017   Procedure: LEFT HEART CATH AND CORONARY ANGIOGRAPHY;  Surgeon: Jettie Booze, MD;  Location: [facility];  Service: Cardiovascular;  Laterality: N/A;  . LIVER BIOPSY     x 2  . RADIAL ARTERY HARVEST Left 03/24/2017   Procedure: RADIAL ARTERY HARVEST;  Surgeon: Ivin Poot, MD;  Location: [facility];  Service: Open Heart Surgery;  Laterality: Left;  . TEE WITHOUT CARDIOVERSION N/A 03/24/2017   Procedure: TRANSESOPHAGEAL ECHOCARDIOGRAM (TEE);  Surgeon: Prescott Gum, Collier Salina, MD;  Location: [facility];  Service: Open Heart Surgery;  Laterality: N/A;  . WISDOM TOOTH EXTRACTION  Prior to Admission medications   Medication Sig Start Date End Date Taking? Authorizing Provider  aspirin EC 81 MG tablet Take 2 tablets (162 mg total) by mouth daily. 03/16/17  Yes Revankar, Reita Cliche, MD  Calcium Carbonate-Vitamin D (CALCIUM + D PO) Take 1 tablet by mouth 2 (two) times a  week.    Yes [provider]  cholecalciferol (VITAMIN D) 1000 units tablet Take 2,000 Units by mouth 2 (two) times a week.    Yes [provider]  cycloSPORINE (RESTASIS) 0.05 % ophthalmic emulsion Place 1 drop into both eyes daily.   Yes [provider]  diphenhydrAMINE (BENADRYL) 25 mg capsule Take 50 mg by mouth at bedtime as needed for itching.   Yes [provider]  Melatonin 3 MG TABS Take 6 mg by mouth at bedtime.   Yes [provider]  Multiple Vitamin (MULTIVITAMIN WITH MINERALS) TABS Take 1 tablet by mouth 2 (two) times a week.    Yes [provider]  omeprazole (PRILOSEC) 10 MG capsule Take 20 mg by mouth daily.   Yes [provider]  Probiotic Product (PROBIOTIC PO) Take 1 tablet by mouth daily.   Yes [provider]  trimethoprim (TRIMPEX) 100 MG tablet Take 1 tablet by mouth every other day. 02/10/17  Yes [provider]  ursodiol (ACTIGALL) 300 MG capsule Take 600-900 mg by mouth 2 (two) times daily. 900 mg in am,  600 mg in pm   Yes [provider]  ibuprofen (ADVIL,MOTRIN) 200 MG tablet Take 600 mg by mouth every 8 (eight) hours as needed for headache or mild pain.     [provider]  lidocaine (LIDODERM) 5 % Place 1 patch onto the skin daily. Remove & Discard patch within 12 hours or as directed by MD Patient not taking: Reported on 03/16/2017 09/07/15   McVeigh, Sherren Mocha, PA  nitroGLYCERIN (NITROSTAT) 0.4 MG SL tablet Place 1 tablet (0.4 mg total) under the tongue every 5 (five) minutes as needed. Patient taking differently: Place 0.4 mg under the tongue every 5 (five) minutes as needed for chest pain.  03/16/17 06/14/17  Revankar, Reita Cliche, MD    Current Facility-Administered Medications  Medication Dose Route Frequency Provider Last Rate Last Dose  . 0.45 % sodium chloride infusion   Intravenous Continuous PRN Nani Skillern, PA-C   Stopped at 03/25/17 1900  . 0.9 %  sodium  chloride infusion  250 mL Intravenous Continuous Lars Pinks M, PA-C      . 0.9 %  sodium chloride infusion   Intravenous Continuous Nani Skillern, PA-C   Stopped at 03/25/17 1023  . ALPRAZolam Duanne Moron) tablet 0.25 mg  0.25 mg Oral BID PRN Lars Pinks M, PA-C      . aspirin EC tablet 325 mg  325 mg Oral Daily Lars Pinks M, [place]   325 mg at 03/30/17 6761   Or  . aspirin chewable tablet 324 mg  324 mg Per Tube Daily Tacy Dura, Donielle M, PA-C      . bisacodyl (DULCOLAX) EC tablet 10 mg  10 mg Oral Daily Lars Pinks M, PA-C   10 mg at 03/30/17 9509   Or  . bisacodyl (DULCOLAX) suppository 10 mg  10 mg Rectal Daily Lars Pinks M, PA-C      . cefTAZidime (FORTAZ) 1 g in dextrose 5 % 50 mL IVPB  1 g Intravenous Q8H Ivin Poot, MD   Stopped at 03/30/17 1200  . Chlorhexidine Gluconate Cloth  2 % PADS 6 each  6 each Topical Daily Ivin Poot, MD   6 each at 03/29/17 1814  . cycloSPORINE (RESTASIS) 0.05 % ophthalmic emulsion 1 drop  1 drop Both Eyes Daily Jettie Booze, MD   1 drop at 03/28/17 0957  . docusate sodium (COLACE) capsule 200 mg  200 mg Oral Daily Lars Pinks M, PA-C   200 mg at 03/30/17 1610  . fentaNYL (DURAGESIC - dosed mcg/hr) patch 50 mcg  50 mcg Transdermal Q72H Prescott Gum, Collier Salina, MD   50 mcg at 03/29/17 0906  . furosemide (LASIX) tablet 40 mg  40 mg Oral Daily Gaye Pollack, MD   40 mg at 03/30/17 9604  . isosorbide mononitrate (IMDUR) 24 hr tablet 15 mg  15 mg Oral Daily Prescott Gum, Collier Salina, MD   15 mg at 03/30/17 [phone]  . lactated ringers infusion   Intravenous Continuous Nani Skillern, PA-C 10 mL/hr at 03/28/17 0600    . lactulose (CHRONULAC) 10 GM/15ML solution 20 g  20 g Oral Daily Barrett, Erin R, PA-C      . lidocaine (LIDODERM) 5 % 1 patch  1 patch Transdermal Q24H Ivin Poot, MD   1 patch at 03/28/17 1703  . Melatonin TABS 6 mg  6 mg Oral QHS Lars Pinks M, PA-C   6 mg at 03/29/17 2158   . metoprolol tartrate (LOPRESSOR) tablet 12.5 mg  12.5 mg Oral BID Lars Pinks M, PA-C   12.5 mg at 03/30/17 8119   Or  . metoprolol tartrate (LOPRESSOR) 25 mg/10 mL oral suspension 12.5 mg  12.5 mg Per Tube BID Lars Pinks M, PA-C      . metoprolol tartrate (LOPRESSOR) injection 2.5-5 mg  2.5-5 mg Intravenous Q2H PRN Lars Pinks M, PA-C      . morphine 4 MG/ML injection 2-5 mg  2-5 mg Intravenous Q1H PRN Ivin Poot, MD   4 mg at 03/30/17 0217  . mupirocin ointment (BACTROBAN) 2 %   Nasal BID Prescott Gum, Collier Salina, MD      . pantoprazole (PROTONIX) EC tablet 40 mg  40 mg Oral Daily Lars Pinks M, PA-C   40 mg at 03/30/17 1478  . potassium chloride 10 mEq in 50 mL *CENTRAL LINE* IVPB  10 mEq Intravenous Q1H PRN Ivin Poot, MD   Stopped at 03/27/17 1946  . potassium chloride SA (K-DUR,KLOR-CON) CR tablet 20 mEq  20 mEq Oral BID Gaye Pollack, MD   20 mEq at 03/30/17 2956  . promethazine (PHENERGAN) injection 12.5 mg  12.5 mg Intravenous Q6H PRN Lars Pinks M, PA-C      . sodium chloride flush (NS) 0.9 % injection 10-40 mL  10-40 mL Intracatheter Q12H Prescott Gum, Collier Salina, MD   10 mL at 03/30/17 0939  . sodium chloride flush (NS) 0.9 % injection 10-40 mL  10-40 mL Intracatheter PRN Prescott Gum, Collier Salina, MD      . sodium chloride flush (NS) 0.9 % injection 3 mL  3 mL Intravenous Q12H Lars Pinks M, PA-C   3 mL at 03/29/17 2159  . sodium chloride flush (NS) 0.9 % injection 3 mL  3 mL Intravenous PRN Lars Pinks M, PA-C      . trimethoprim (TRIMPEX) tablet 100 mg  100 mg Oral QODAY Hammons, Kimberly B, RPH   100 mg at 03/30/17 0937  . ursodiol (ACTIGALL) capsule 900 mg  900 mg Oral Q breakfast Hammons, Kimberly B, RPH   900 mg  at 03/30/17 2703   And  . ursodiol (ACTIGALL) capsule 600 mg  600 mg Oral Q supper Hammons, Theone Murdoch, RPH   600 mg at 03/29/17 1815    Allergies as of 03/16/2017 - Review Complete 03/16/2017  Allergen Reaction Noted   . Codeine Nausea And Vomiting 08/27/2011  . Erythromycin Nausea And Vomiting 08/27/2011    Family History  Problem Relation Age of Onset  . CAD Father   . Diabetes Mellitus II Father     Social History   Socioeconomic History  . Marital status: Divorced    Spouse name: Not on file  . Number of children: 3  . Years of education: 30  . Highest education level: Associate degree: academic program  Social Needs  . Financial resource strain: Not hard at all  . Food insecurity - worry: Never true  . Food insecurity - inability: Never true  . Transportation needs - medical: No  . Transportation needs - non-medical: No  Occupational History  . Not on file  Tobacco Use  . Smoking status: Never Smoker  . Smokeless tobacco: Never Used  Substance and Sexual Activity  . Alcohol use: Yes    Alcohol/week: 0.6 oz    Types: 1 Glasses of wine per week    Comment: for dinner  . Drug use: No  . Sexual activity: Yes    Partners: Male    Birth control/protection: None  Other Topics Concern  . Not on file  Social History Narrative  . Not on file    Review of Systems: Positive for: GI: Described in detail in HPI.    Gen: Fatigue , Denies any fever, chills, rigors, night sweats, anorexia, fatigue, weakness, malaise, involuntary weight loss, and sleep disorder CV: chest pain, Denies angina, palpitations, syncope, orthopnea, PND, peripheral edema, and claudication. Resp: Denies dyspnea, cough, sputum, wheezing, coughing up blood. GU : Denies urinary burning, blood in urine, urinary frequency, urinary hesitancy, nocturnal urination, and urinary incontinence. MS: Denies joint pain or swelling.  Denies muscle weakness, cramps, atrophy.  Derm: Chronic pruritus Denies rash, oral ulcerations, hives, unhealing ulcers.  Psych: Denies depression, anxiety, memory loss, suicidal ideation, hallucinations,  and confusion. Heme: Denies bruising, bleeding, and enlarged lymph nodes. Neuro:  Denies any  headaches, dizziness, paresthesias. Endo:  Denies any problems with DM, thyroid, adrenal function.  Physical Exam: Vital signs in last 24 hours: Temp:  [97.8 F (36.6 C)-98.9 F (37.2 C)] 97.8 F (36.6 C) (02/04 1245) Pulse Rate:  [87-108] 97 (02/04 1500) Resp:  [16-29] 20 (02/04 1500) BP: (89-164)/(54-96) 104/64 (02/04 1500) SpO2:  [91 %-100 %] 91 % (02/04 1500) Weight:  [78.3 kg (172 lb 9.6 oz)] 78.3 kg (172 lb 9.6 oz) (02/04 0800) Last BM Date: 03/29/17  General:   Alert,  Well-developed, well-nourished, pleasant and cooperative in NAD Head:  Normocephalic and atraumatic. Eyes:  Prominent icterus. Ears:  Normal auditory acuity. Nose:  No deformity, discharge,  or lesions. Mouth:  No deformity or lesions.  Oropharynx pink & moist. Neck:  Supple; no masses or thyromegaly. Lungs:  Healing mid sternal incision,  Clear throughout to auscultation.   No wheezes, crackles, or rhonchi. No acute distress. Heart:  Regular rate and rhythm; no murmurs, clicks, rubs,  or gallops. Extremities:  Surgical incision and staples over left forearm, mild bilateral pitting edema. Neurologic:  Alert and  oriented x4;  grossly normal neurologically. No asterixis Skin:  Intact without significant lesions or rashes. Psych:  Alert and cooperative. Normal mood and  affect. Abdomen:  Soft, nontender and nondistended. No masses, hepatosplenomegaly or hernias noted. Normal bowel sounds, without guarding, and without rebound.         Lab Results: Recent Labs    03/28/17 0410 03/29/17 0339 03/30/17 0502  WBC 8.4 8.9 9.3  HGB 8.6* 8.4* 8.6*  HCT 25.8* 25.2* 26.0*  PLT 156 172 202   BMET Recent Labs    03/28/17 0410 03/29/17 0339 03/30/17 0502  NA 138 136 134*  K 3.9 3.6 4.0  CL 100* 97* 95*  CO2 27 27 29   GLUCOSE 106* 114* 113*  BUN 14 10 9   CREATININE 0.62 0.67 0.54  CALCIUM 8.8* 8.8* 8.8*   LFT Recent Labs    03/30/17 0502  PROT 6.2*  ALBUMIN 2.8*  AST 83*  ALT 67*  ALKPHOS 153*   BILITOT 14.3*   PT/INR No results for input(s): LABPROT, INR in the last 72 hours.  Studies/Results: Dg Chest 2 View  Result Date: 03/30/2017 CLINICAL DATA:  47 year old female post CABG with shortness breath. Subsequent encounter. EXAM: CHEST  2 VIEW COMPARISON:  03/28/2017 chest x-ray. FINDINGS: Right PICC line tip cavoatrial junction/proximal right atrium. To be within the distal superior vena cava, this can be retracted by 1.5 cm. Post CABG.  Cardiomegaly. Fluctuating diffuse airspace disease with slightly patchy nodular configuration may represent infectious process superimposed upon pulmonary edema with bilateral pleural effusions. No gross pneumothorax. No acute osseous abnormality. IMPRESSION: Fluctuating diffuse airspace disease with slightly patchy nodular configuration may represent infectious process superimposed upon pulmonary edema with bilateral pleural effusions. Right PICC line tip cavoatrial junction/proximal right atrium. To be within the distal superior vena cava, this can be retracted by 1.5 cm. Post CABG.  Cardiomegaly. Electronically Signed   By: Genia Del M.D.   On: 03/30/2017 07:23    Impression: 1. History of PBC(diagnosis in 2005), maintained on ursodiol, unclear if patient has cirrhosis(focal portal fibrosis without obvious cirrhosis noted on liver biopsy from 6/14, if cirrhosis is present MELDNa score would be around 22,however, INR was from 03/24/17, will repeat to get an updated/accurate MELDNa score)  2. Elevated LFTs(total bilirubin 14.3, near normal alkaline phosphatase 153, minimally elevated AST ALT of 83 and 67).  DDX:  A. Decompensation of underlying liver disease from recent surgery B. Medication induced(trimethoprim/Ceftazidime) C. Other etiologies: biliary tract obstruction(less likely with near normal ALP),other causes of hepatitis.  No signs of encephalopathy or coagulopathy (PT 16.3/INR 1.32 on 03/24/2017)  Plan: 1. Ultrasound abdomen 2.  Discontinue antibiotics if no obvious source of infection evident, avoid any hepatotoxic medications 3. Will send workup for other chronic liver disease Patient has been started on lactulose 20 g daily. Continue ursodiol 900 mg in morning and 600 mg in evening.   LOS: 7 days   [name], M.D. , 3:13 PM  Pager [phone] If no answer or after 5 PM call [phone]

## 2017-03-30 NOTE — Progress Notes (Addendum)
TCTS DAILY ICU PROGRESS NOTE                   [address]            [place] [address]          [phone]   6 Days Post-Op Procedure(s) (LRB): CORONARY ARTERY BYPASS GRAFTING (CABG) x two, using left internal mammary artery, left radial artery, and right leg greater saphenous vein harvested endoscopically (N/A) TRANSESOPHAGEAL ECHOCARDIOGRAM (TEE) (N/A) RADIAL ARTERY HARVEST (Left) ENDOVEIN HARVEST OF GREATER SAPHENOUS VEIN (Right)  Total Length of Stay:  LOS: 7 days   Subjective: Patient states having a lot of anxiety and feels at times as though she is having a panic attack.   Objective: Vital signs in last 24 hours: Temp:  [98 F (36.7 C)-98.9 F (37.2 C)] 98 F (36.7 C) (02/04 0400) Pulse Rate:  [87-111] 98 (02/04 0700) Cardiac Rhythm: Sinus tachycardia;Normal sinus rhythm (02/04 0400) Resp:  [16-32] 19 (02/04 0700) BP: (91-119)/(55-84) 94/84 (02/04 0700) SpO2:  [92 %-100 %] 100 % (02/04 0700)  Filed Weights   03/27/17 0500 03/28/17 0500 03/29/17 0500  Weight: 175 lb 4.3 oz (79.5 kg) 174 lb 6.1 oz (79.1 kg) 159 lb 9.8 oz (72.4 kg)      Intake/Output from previous day: 02/03 0701 - 02/04 0700 In: 390 [P.O.:240; IV Piggyback:150] Out: 1941 [Urine:2551]  Intake/Output this shift: No intake/output data recorded.  Current Meds: Scheduled Meds: . aspirin EC  325 mg Oral Daily   Or  . aspirin  324 mg Per Tube Daily  . bisacodyl  10 mg Oral Daily   Or  . bisacodyl  10 mg Rectal Daily  . Chlorhexidine Gluconate Cloth  6 each Topical Daily  . cycloSPORINE  1 drop Both Eyes Daily  . docusate sodium  200 mg Oral Daily  . fentaNYL  50 mcg Transdermal Q72H  . furosemide  40 mg Oral Daily  . isosorbide mononitrate  15 mg Oral Daily  . lidocaine  1 patch Transdermal Q24H  . Melatonin  6 mg Oral QHS  . metoprolol tartrate  12.5 mg Oral BID   Or  . metoprolol tartrate  12.5 mg Per Tube BID  . mupirocin ointment   Nasal BID  . pantoprazole  40  mg Oral Daily  . potassium chloride  20 mEq Oral BID  . sodium chloride flush  10-40 mL Intracatheter Q12H  . sodium chloride flush  3 mL Intravenous Q12H  . trimethoprim  100 mg Oral QODAY  . ursodiol  900 mg Oral Q breakfast   And  . ursodiol  600 mg Oral Q supper   Continuous Infusions: . sodium chloride Stopped (03/25/17 1900)  . sodium chloride    . sodium chloride Stopped (03/25/17 1023)  . cefTAZidime (FORTAZ)  IV Stopped (03/30/17 0540)  . lactated ringers 10 mL/hr at 03/28/17 0600  . potassium chloride Stopped (03/27/17 1946)   PRN Meds:.sodium chloride, metoprolol tartrate, morphine injection, ondansetron (ZOFRAN) IV, potassium chloride, sodium chloride flush, sodium chloride flush  General appearance: alert, cooperative and no distress Heart: RRR Lungs: Diminshed at bases Abdomen: Soft, non tender, bowel sounds present Extremities: Bilateral LE edema Wounds: Sternal wound is clean and dry. LUE wound is also clean and dry.  Lab Results: CBC: Recent Labs    03/29/17 0339 03/30/17 0502  WBC 8.9 9.3  HGB 8.4* 8.6*  HCT 25.2* 26.0*  PLT 172 202   BMET:  Recent Labs  03/29/17 0339 03/30/17 0502  NA 136 134*  K 3.6 4.0  CL 97* 95*  CO2 27 29  GLUCOSE 114* 113*  BUN 10 9  CREATININE 0.67 0.54  CALCIUM 8.8* 8.8*    CMET: Lab Results  Component Value Date   WBC 9.3 03/30/2017   HGB 8.6 (L) 03/30/2017   HCT 26.0 (L) 03/30/2017   PLT 202 03/30/2017   GLUCOSE 113 (H) 03/30/2017   CHOL 406 (H) 03/23/2017   TRIG 309 (H) 03/23/2017   HDL 10 (L) 03/23/2017   LDLCALC 334 (H) 03/23/2017   ALT 67 (H) 03/30/2017   AST 83 (H) 03/30/2017   NA 134 (L) 03/30/2017   K 4.0 03/30/2017   CL 95 (L) 03/30/2017   CREATININE 0.54 03/30/2017   BUN 9 03/30/2017   CO2 29 03/30/2017   INR 1.32 03/24/2017    PT/INR: No results for input(s): LABPROT, INR in the last 72 hours. Radiology: Dg Chest 2 View  Result Date: 03/30/2017 CLINICAL DATA:  47 year old female post  CABG with shortness breath. Subsequent encounter. EXAM: CHEST  2 VIEW COMPARISON:  03/28/2017 chest x-ray. FINDINGS: Right PICC line tip cavoatrial junction/proximal right atrium. To be within the distal superior vena cava, this can be retracted by 1.5 cm. Post CABG.  Cardiomegaly. Fluctuating diffuse airspace disease with slightly patchy nodular configuration may represent infectious process superimposed upon pulmonary edema with bilateral pleural effusions. No gross pneumothorax. No acute osseous abnormality. IMPRESSION: Fluctuating diffuse airspace disease with slightly patchy nodular configuration may represent infectious process superimposed upon pulmonary edema with bilateral pleural effusions. Right PICC line tip cavoatrial junction/proximal right atrium. To be within the distal superior vena cava, this can be retracted by 1.5 cm. Post CABG.  Cardiomegaly. Electronically Signed   By: Genia Del M.D.   On: 03/30/2017 07:23     Assessment/Plan: S/P Procedure(s) (LRB): CORONARY ARTERY BYPASS GRAFTING (CABG) x two, using left internal mammary artery, left radial artery, and right leg greater saphenous vein harvested endoscopically (N/A) TRANSESOPHAGEAL ECHOCARDIOGRAM (TEE) (N/A) RADIAL ARTERY HARVEST (Left) ENDOVEIN HARVEST OF GREATER SAPHENOUS VEIN (Right)  1. CV-Intermittent ST. On Lopressor 12.5 mg bid. Will not increase Lopressor at this time as BP still labile.  2. Pulmonary-On 2 liters of oxygen via . Wean to room air as tolerates. CXR this am shows no pneumothorax, small bilateral pleural effusions/pulm vascular congestion, and tip of right PICC within the distal superior vena cava. Encourage incentive spirometer. 3. Volume Overload-On Lasix 40 mg daily. Will give Metolazone today. 4. ABL anemia-H and H stable at 8.6 and 26 5. Biliary cirrhosis-on Ursodiol. Mild increase in transaminases and alk phos. Tbili increased to 14.3. CMP in am 6. Xanax low dose PRN bid 7. CBGs 122/107/103. No  history of diabetes. Stop accu checks  Donielle Liston Alba PA-C 03/30/2017 7:49 AM   Bili up to 14, inr 1.32  Stable day I have seen and examined Christine Butler and agree with the above assessment  and plan.  Grace Isaac MD Beeper [phone] Office [phone] 03/30/2017 8:10 PM

## 2017-03-30 NOTE — Plan of Care (Signed)
  Progressing Health Behavior/Discharge Planning: Ability to manage health-related needs will improve 03/30/2017 0956 - Progressing by Jenne Campus, RN Clinical Measurements: Will remain free from infection 03/30/2017 0956 - Progressing by Jenne Campus, RN Respiratory complications will improve 03/30/2017 0956 - Progressing by Jenne Campus, RN Cardiovascular complication will be avoided 03/30/2017 1505 - Progressing by Jenne Campus, RN

## 2017-03-31 ENCOUNTER — Inpatient Hospital Stay (HOSPITAL_COMMUNITY): Payer: 59

## 2017-03-31 LAB — HEPATITIS B CORE ANTIBODY, IGM: Hep B C IgM: NEGATIVE

## 2017-03-31 LAB — COMPREHENSIVE METABOLIC PANEL
ALBUMIN: 2.3 g/dL — AB (ref 3.5–5.0)
ALK PHOS: 132 U/L — AB (ref 38–126)
ALT: 66 U/L — ABNORMAL HIGH (ref 14–54)
ANION GAP: 16 — AB (ref 5–15)
AST: 90 U/L — ABNORMAL HIGH (ref 15–41)
BILIRUBIN TOTAL: 11.6 mg/dL — AB (ref 0.3–1.2)
BUN: 8 mg/dL (ref 6–20)
CALCIUM: 8.5 mg/dL — AB (ref 8.9–10.3)
CO2: 25 mmol/L (ref 22–32)
Chloride: 91 mmol/L — ABNORMAL LOW (ref 101–111)
Creatinine, Ser: 0.52 mg/dL (ref 0.44–1.00)
GFR calc Af Amer: >60 mL/min (ref >60)
GFR calc non Af Amer: >60 mL/min (ref >60)
GLUCOSE: 92 mg/dL (ref 65–99)
Potassium: 3.5 mmol/L (ref 3.5–5.1)
Sodium: 132 mmol/L — ABNORMAL LOW (ref 135–145)
TOTAL PROTEIN: 5.2 g/dL — AB (ref 6.5–8.1)

## 2017-03-31 LAB — GLUCOSE, CAPILLARY
Glucose-Capillary: 105 mg/dL — ABNORMAL HIGH (ref 65–99)
Glucose-Capillary: 106 mg/dL — ABNORMAL HIGH (ref 65–99)
Glucose-Capillary: 115 mg/dL — ABNORMAL HIGH (ref 65–99)
Glucose-Capillary: 121 mg/dL — ABNORMAL HIGH (ref 65–99)
Glucose-Capillary: 124 mg/dL — ABNORMAL HIGH (ref 65–99)
Glucose-Capillary: 96 mg/dL (ref 65–99)

## 2017-03-31 LAB — HEPATITIS B SURFACE ANTIGEN: HEP B S AG: NEGATIVE

## 2017-03-31 LAB — CBC
HCT: 23.8 % — ABNORMAL LOW (ref 36.0–46.0)
Hemoglobin: 7.8 g/dL — ABNORMAL LOW (ref 12.0–15.0)
MCH: 32.5 pg (ref 26.0–34.0)
MCHC: 32.8 g/dL (ref 30.0–36.0)
MCV: 99.2 fL (ref 78.0–100.0)
Platelets: 202 10*3/uL (ref 150–400)
RBC: 2.4 MIL/uL — ABNORMAL LOW (ref 3.87–5.11)
RDW: 16.1 % — ABNORMAL HIGH (ref 11.5–15.5)
WBC: 7.6 10*3/uL (ref 4.0–10.5)

## 2017-03-31 LAB — PROTIME-INR
INR: 1.26
Prothrombin Time: 15.7 seconds — ABNORMAL HIGH (ref 11.4–15.2)

## 2017-03-31 LAB — HEPATITIS C ANTIBODY

## 2017-03-31 LAB — MITOCHONDRIAL ANTIBODIES: MITOCHONDRIAL M2 AB, IGG: 162.2 U — AB (ref 0.0–20.0)

## 2017-03-31 LAB — CERULOPLASMIN: Ceruloplasmin: 45.3 mg/dL — ABNORMAL HIGH (ref 19.0–39.0)

## 2017-03-31 LAB — ALPHA-1-ANTITRYPSIN: A1 ANTITRYPSIN SER: 273 mg/dL — AB (ref 90–200)

## 2017-03-31 LAB — ANTI-SMOOTH MUSCLE ANTIBODY, IGG: F-ACTIN AB IGG: 12 U (ref 0–19)

## 2017-03-31 MED ORDER — PROMETHAZINE HCL 25 MG/ML IJ SOLN: 6.2500 mg | Freq: Four times a day (QID) | INTRAMUSCULAR | Status: DC | PRN

## 2017-03-31 MED ORDER — ONDANSETRON HCL 4 MG/2ML IJ SOLN
4.0000 mg | Freq: Four times a day (QID) | INTRAMUSCULAR | Status: DC | PRN
  Administered 2017-03-31 – 2017-04-02 (×5): 4 mg via INTRAVENOUS
  Filled 2017-03-31 (×5): qty 2

## 2017-03-31 NOTE — Progress Notes (Signed)
TCTS BRIEF SICU PROGRESS NOTE  7 Days Post-Op  S/P Procedure(s) (LRB): CORONARY ARTERY BYPASS GRAFTING (CABG) x two, using left internal mammary artery, left radial artery, and right leg greater saphenous vein harvested endoscopically (N/A) TRANSESOPHAGEAL ECHOCARDIOGRAM (TEE) (N/A) RADIAL ARTERY HARVEST (Left) ENDOVEIN HARVEST OF GREATER SAPHENOUS VEIN (Right)   Stable day.  Nausea somewhat improved  Plan: Continue current plan  Rexene Alberts, MD 03/31/2017 7:42 PM

## 2017-03-31 NOTE — Progress Notes (Signed)
Subjective: The patient was seen and examined at bedside. She reported 2 episodes of vomiting gastric contents yesterday evening and was nauseous immediately after receiving IV morphine and Phenergan. She has not had a bowel movement for a few days but complains of increased gas with use of lactulose. Complains of lightheadedness and dizziness(remains slightly hypotensive with heart rate in upper 90s).   Objective: Vital signs in last 24 hours: Temp:  [97.8 F (36.6 C)-98.4 F (36.9 C)] 97.8 F (36.6 C) (02/05 0731) Pulse Rate:  [85-101] 98 (02/05 1100) Resp:  [15-28] 25 (02/05 1100) BP: (86-104)/(52-87) 86/53 (02/05 1100) SpO2:  [91 %-100 %] 93 % (02/05 1100) Weight:  [76.3 kg (168 lb 3.4 oz)] 76.3 kg (168 lb 3.4 oz) (02/05 0242) Weight change:  Last BM Date: 03/29/17  PE: Deep icterus GENERAL: Not in acute distress ABDOMEN: Soft, nondistended,  normoactive bowel sounds EXTREMITIES: No edema no deformity  Lab Results: Results for orders placed or performed during the hospital encounter of 03/23/17 (from the past 48 hour(s))  Glucose, capillary     Status: Abnormal   Collection Time: 03/29/17 12:05 PM  Result Value Ref Range   Glucose-Capillary 127 (H) 65 - 99 mg/dL   Comment 1 Notify RN   Glucose, capillary     Status: Abnormal   Collection Time: 03/29/17  4:10 PM  Result Value Ref Range   Glucose-Capillary 101 (H) 65 - 99 mg/dL   Comment 1 Notify RN   Glucose, capillary     Status: Abnormal   Collection Time: 03/29/17  7:17 PM  Result Value Ref Range   Glucose-Capillary 122 (H) 65 - 99 mg/dL   Comment 1 Capillary Specimen    Comment 2 Notify RN   Glucose, capillary     Status: Abnormal   Collection Time: 03/29/17 11:10 PM  Result Value Ref Range   Glucose-Capillary 107 (H) 65 - 99 mg/dL   Comment 1 Capillary Specimen    Comment 2 Notify RN   Glucose, capillary     Status: Abnormal   Collection Time: 03/30/17  3:21 AM  Result Value Ref Range   Glucose-Capillary  103 (H) 65 - 99 mg/dL   Comment 1 Capillary Specimen    Comment 2 Notify RN   CBC     Status: Abnormal   Collection Time: 03/30/17  5:02 AM  Result Value Ref Range   WBC 9.3 4.0 - 10.5 K/uL   RBC 2.66 (L) 3.87 - 5.11 MIL/uL   Hemoglobin 8.6 (L) 12.0 - 15.0 g/dL   HCT 26.0 (L) 36.0 - 46.0 %   MCV 97.7 78.0 - 100.0 fL   MCH 32.3 26.0 - 34.0 pg   MCHC 33.1 30.0 - 36.0 g/dL   RDW 16.4 (H) 11.5 - 15.5 %   Platelets 202 150 - 400 K/uL    Comment: Performed at [facility], [address], [place], [place] [address]  Comprehensive metabolic panel     Status: Abnormal   Collection Time: 03/30/17  5:02 AM  Result Value Ref Range   Sodium 134 (L) 135 - 145 mmol/L   Potassium 4.0 3.5 - 5.1 mmol/L   Chloride 95 (L) 101 - 111 mmol/L   CO2 29 22 - 32 mmol/L   Glucose, Bld 113 (H) 65 - 99 mg/dL   BUN 9 6 - 20 mg/dL   Creatinine, Ser 0.54 0.44 - 1.00 mg/dL   Calcium 8.8 (L) 8.9 - 10.3 mg/dL   Total Protein 6.2 (L)  6.5 - 8.1 g/dL   Albumin 2.8 (L) 3.5 - 5.0 g/dL   AST 83 (H) 15 - 41 U/L   ALT 67 (H) 14 - 54 U/L   Alkaline Phosphatase 153 (H) 38 - 126 U/L   Total Bilirubin 14.3 (H) 0.3 - 1.2 mg/dL   GFR calc non Af Amer >60 >60 mL/min   GFR calc Af Amer >60 >60 mL/min    Comment: (NOTE) The eGFR has been calculated using the CKD EPI equation. This calculation has not been validated in all clinical situations. eGFR's persistently <60 mL/min signify possible Chronic Kidney Disease.    Anion gap 10 5 - 15    Comment: Performed at [facility] [address] [place], [place] [address]  Glucose, capillary     Status: Abnormal   Collection Time: 03/30/17  8:35 AM  Result Value Ref Range   Glucose-Capillary 114 (H) 65 - 99 mg/dL   Comment 1 Notify RN   Ammonia     Status: Abnormal   Collection Time: 03/30/17 11:15 AM  Result Value Ref Range   Ammonia 36 (H) 9 - 35 umol/L    Comment: Performed at [facility], [place] [address], [place], [place] [address]  Glucose,  capillary     Status: Abnormal   Collection Time: 03/30/17 12:47 PM  Result Value Ref Range   Glucose-Capillary 146 (H) 65 - 99 mg/dL   Comment 1 Notify RN   Hepatitis B surface antigen     Status: None   Collection Time: 03/30/17  3:43 PM  Result Value Ref Range   Hepatitis B Surface Ag Negative Negative    Comment: (NOTE) Performed At: [facility] [address] [place], [place] [id number] Rush Farmer MD [id number] Performed at [facility], [place] [address] [place], [place] [address]   Iron and TIBC     Status: Abnormal   Collection Time: 03/30/17  4:00 PM  Result Value Ref Range   Iron 36 28 - 170 ug/dL   TIBC 221 (L) 250 - 450 ug/dL   Saturation Ratios 16 10.4 - 31.8 %   UIBC 185 ug/dL    Comment: Performed at [facility] [address], [place], [place] [address]  Ferritin     Status: Abnormal   Collection Time: 03/30/17  4:00 PM  Result Value Ref Range   Ferritin 313 (H) 11 - 307 ng/mL    Comment: Performed at [facility], [place] [address], [place], [place] [address]  Hepatitis C antibody     Status: None   Collection Time: 03/30/17  4:00 PM  Result Value Ref Range   HCV Ab <0.1 0.0 - 0.9 s/co ratio    Comment: (NOTE)                                  Negative:     < 0.8                             Indeterminate: 0.8 - 0.9                                  Positive:     > 0.9 The CDC recommends that a positive HCV antibody result be followed up with a  HCV Nucleic Acid Amplification test (128786). Performed At: [facility] [place], [place] [id number] Rush Farmer MD [id number] Performed at [facility], [place] [address] [place], [place] [address]   Hepatitis B core antibody, IgM     Status: None   Collection Time: 03/30/17  4:00 PM  Result Value Ref Range   Hep B C IgM Negative Negative    Comment: (NOTE) Performed At: [facility] [address] [place], [place] [id number] Rush Farmer MD [id number] Performed at [facility], [place] [address], [place], [place] [address]   Ceruloplasmin     Status: Abnormal   Collection Time: 03/30/17  4:00 PM  Result Value Ref Range   Ceruloplasmin 45.3 (H) 19.0 - 39.0 mg/dL    Comment: (NOTE) Performed At: [facility] [place], [place] [id number] Rush Farmer MD [id number] Performed at [facility], [place] [address] [place], [place] [address]   Alpha-1-antitrypsin     Status: Abnormal   Collection Time: 03/30/17  4:00 PM  Result Value Ref Range   A-1 Antitrypsin, Ser 273 (H) 90 - 200 mg/dL    Comment: (NOTE) Performed At: [facility] [place], [place] [id number] Rush Farmer MD [id number] Performed at [facility], [place] [address] [place], [place] [address]   Protime-INR     Status: Abnormal   Collection Time: 03/30/17  4:00 PM  Result Value Ref Range   Prothrombin Time 15.4 (H) 11.4 - 15.2 seconds   INR 1.23     Comment: Performed at [facility] [address], [place], [place] [address]  Glucose, capillary     Status: Abnormal   Collection Time: 03/30/17  4:29 PM  Result Value Ref Range   Glucose-Capillary 119 (H) 65 - 99 mg/dL   Comment 1 Capillary Specimen   Glucose, capillary     Status: Abnormal   Collection Time: 03/30/17  7:32 PM  Result Value Ref Range   Glucose-Capillary 104 (H) 65 - 99 mg/dL   Comment 1 Capillary Specimen   Glucose, capillary     Status: Abnormal   Collection Time: 03/30/17 11:58 PM  Result Value Ref Range   Glucose-Capillary 124 (H) 65 - 99 mg/dL   Comment 1 Capillary Specimen   Glucose, capillary     Status: Abnormal   Collection Time: 03/31/17  3:29 AM  Result Value Ref Range   Glucose-Capillary 105 (H) 65 - 99 mg/dL   Comment 1 Capillary Specimen   CBC     Status: Abnormal   Collection Time: 03/31/17  4:06 AM  Result Value Ref Range   WBC 7.6 4.0 - 10.5 K/uL   RBC 2.40 (L) 3.87 - 5.11  MIL/uL   Hemoglobin 7.8 (L) 12.0 - 15.0 g/dL   HCT 23.8 (L) 36.0 - 46.0 %   MCV 99.2 78.0 - 100.0 fL   MCH 32.5 26.0 - 34.0 pg   MCHC 32.8 30.0 - 36.0 g/dL   RDW 16.1 (H) 11.5 - 15.5 %   Platelets 202 150 - 400 K/uL    Comment: Performed at [facility], [place], [place], [place] [address]  Comprehensive metabolic panel     Status: Abnormal   Collection Time: 03/31/17  4:06 AM  Result Value Ref Range   Sodium 132 (L) 135 - 145 mmol/L   Potassium 3.5 3.5 - 5.1 mmol/L   Chloride 91 (L) 101 - 111  mmol/L   CO2 25 22 - 32 mmol/L   Glucose, Bld 92 65 - 99 mg/dL   BUN 8 6 - 20 mg/dL   Creatinine, Ser 0.52 0.44 - 1.00 mg/dL   Calcium 8.5 (L) 8.9 - 10.3 mg/dL   Total Protein 5.2 (L) 6.5 - 8.1 g/dL   Albumin 2.3 (L) 3.5 - 5.0 g/dL   AST 90 (H) 15 - 41 U/L   ALT 66 (H) 14 - 54 U/L   Alkaline Phosphatase 132 (H) 38 - 126 U/L   Total Bilirubin 11.6 (H) 0.3 - 1.2 mg/dL   GFR calc non Af Amer >60 >60 mL/min   GFR calc Af Amer >60 >60 mL/min    Comment: (NOTE) The eGFR has been calculated using the CKD EPI equation. This calculation has not been validated in all clinical situations. eGFR's persistently <60 mL/min signify possible Chronic Kidney Disease.    Anion gap 16 (H) 5 - 15    Comment: Performed at [facility], [place] [address] [place],  [address]  Protime-INR     Status: Abnormal   Collection Time: 03/31/17  4:06 AM  Result Value Ref Range   Prothrombin Time 15.7 (H) 11.4 - 15.2 seconds   INR 1.26     Comment: Performed at [facility] [address] [place], [place] [address]  Glucose, capillary     Status: None   Collection Time: 03/31/17  7:28 AM  Result Value Ref Range   Glucose-Capillary 96 65 - 99 mg/dL   Comment 1 Capillary Specimen    Comment 2 Notify RN     Studies/Results: Dg Chest 2 View  Addendum Date: 03/31/2017   ADDENDUM REPORT: 03/31/2017 07:47 ADDENDUM: Left PICC line is in place not right PICC line as initially dictated.  Electronically Signed   By: Genia Del M.D.   On: 03/31/2017 07:47   Result Date: 03/31/2017 CLINICAL DATA:  48 year old female post CABG with shortness breath. Subsequent encounter. EXAM: CHEST  2 VIEW COMPARISON:  03/28/2017 chest x-ray. FINDINGS: Right PICC line tip cavoatrial junction/proximal right atrium. To be within the distal superior vena cava, this can be retracted by 1.5 cm. Post CABG.  Cardiomegaly. Fluctuating diffuse airspace disease with slightly patchy nodular configuration may represent infectious process superimposed upon pulmonary edema with bilateral pleural effusions. No gross pneumothorax. No acute osseous abnormality. IMPRESSION: Fluctuating diffuse airspace disease with slightly patchy nodular configuration may represent infectious process superimposed upon pulmonary edema with bilateral pleural effusions. Right PICC line tip cavoatrial junction/proximal right atrium. To be within the distal superior vena cava, this can be retracted by 1.5 cm. Post CABG.  Cardiomegaly. Electronically Signed: By: Genia Del M.D. 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 Subsequent encounter. EXAM: PORTABLE CHEST 1 VIEW COMPARISON:  03/30/2017. FINDINGS: Left PICC line tip distal superior vena cava/cavoatrial junction level. Post CABG.  Cardiomegaly. Decrease nodular airspace disease. Residual diffuse airspace disease and bilateral pleural effusions suggestive of pulmonary edema with basilar atelectasis. Basilar infiltrates not excluded in proper clinical setting. No pneumothorax. IMPRESSION: Decrease nodular airspace disease. Residual diffuse airspace disease and bilateral pleural effusions suggestive of pulmonary edema with basilar atelectasis. Basilar infiltrates not excluded in proper clinical setting. Left PICC line tip distal superior vena cava/cavoatrial junction level. Post CABG.  Cardiomegaly.  Electronically Signed   By: Genia Del M.D.   On: 03/31/2017 07:46  US Abdomen Limited Ruq  Result Date: 03/31/2017 CLINICAL DATA:  Abnormal LFTs. History of primary biliary cirrhosis. EXAM: ULTRASOUND ABDOMEN LIMITED RIGHT UPPER QUADRANT COMPARISON:  Right upper quadrant ultrasound dated November 19, 2015. FINDINGS: Gallbladder: No gallstones or wall thickening visualized. No sonographic Murphy sign noted by sonographer. Common bile duct: Diameter: 2 mm, normal. Liver: No focal lesion identified. Coarsened echotexture with normal echogenicity. Portal vein is patent on color Doppler imaging with normal direction of blood flow towards the liver. Incidental note is made of a small right pleural effusion. IMPRESSION: 1. Coarsened liver echotexture, suggestive of underlying hepatocellular disease. No focal hepatic abnormality. Electronically Signed   By: Titus Dubin M.D.   On: 03/31/2017 08:23    Medications: I have reviewed the patient's current medications.  Assessment: 1. Elevated bilirubin-trending down  Ultrasound: No gallstones, normal CBD, coarsened echotexture suggestive of underlying hepatocellular disease with no focal hepatic abnormality, portal vein patent.  Workup for other liver diseases negative Hep B surface antigen negative Hep C antibody negative Iron saturation 16% with ferritin 313 Ceruloplasmin 45.3/alpha-1 antitrypsin 273(mild elevations likely as elevated acute phase reactants, nonspecific)   2. History of primary biliary cholangitis Maintained on ursodiol 900 mg in a.m., 600 mg in p.m. No signs of liver failure(PT/INR 15.7/1.26, no encephalopathy: On lactulose once a day)  3. Normocytic anemia, hemoglobin 7.8 with MCV 99.2 and normal platelets(202)  4. 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 On trimethoprim every other day since 02/10/17  6. Nausea and vomiting,? Could be related to narcotics, patient is on morphine 2-5  mg IV every 1 hour when necessary for severe pain, wants to discontinue promethazine as she complains of dizziness, we'll start patient on Zofran 4 mg IV every 6 hours as needed.  7. Constipation, has been on narcotics, started on lactulose once a day, we'll start her on Dulcolax suppository once a day.   Plan: Monitor LFTs, downtrending bilirubin reassuring (likely related to recent stress from surgery and underlying chronic liver disease from [place]).  Discussed with patient to avoid hepatotoxic medications, (DB was 4.7, IB 3.3 when TB was 8-conjugated hyperbilirubinemia 03/23/17), if total bilirubindoes not trend down further, recommend discontinuing trimethoprim altogether.    [name] 03/31/2017, 11:41 AM   Pager [phone] If no answer or after 5 PM call [phone]

## 2017-03-31 NOTE — Progress Notes (Addendum)
[address]       [place] [address]             [phone]      7 Days Post-Op Procedure(s) (LRB): CORONARY ARTERY BYPASS GRAFTING (CABG) x two, using left internal mammary artery, left radial artery, and right leg greater saphenous vein harvested endoscopically (N/A) TRANSESOPHAGEAL ECHOCARDIOGRAM (TEE) (N/A) RADIAL ARTERY HARVEST (Left) ENDOVEIN HARVEST OF GREATER SAPHENOUS VEIN (Right) Subjective: Nausea all day yesterday, eating a little this am, ultrasound just done  Objective: Vital signs in last 24 hours: Temp:  [97.8 F (36.6 C)-98.4 F (36.9 C)] 97.8 F (36.6 C) (02/05 0731) Pulse Rate:  [85-101] 92 (02/05 0700) Cardiac Rhythm: Normal sinus rhythm (02/05 0400) Resp:  [15-28] 15 (02/05 0700) BP: (88-164)/(52-96) 92/58 (02/05 0700) SpO2:  [91 %-100 %] 100 % (02/05 0700) Weight:  [168 lb 3.4 oz (76.3 kg)] 168 lb 3.4 oz (76.3 kg) (02/05 0242)  Hemodynamic parameters for last 24 hours:    Intake/Output from previous day: 02/04 0701 - 02/05 0700 In: 630 [P.O.:480; IV Piggyback:150] Out: 2950 [Urine:2950] Intake/Output this shift: No intake/output data recorded.  General appearance: alert and cooperative Heart: regular rate and rhythm Lungs: dim in lower fields Abdomen: soft, non-tender Extremities: edema Wound: incis healing well, left arm N/V intact  Lab Results: Recent Labs    03/30/17 0502 03/31/17 0406  WBC 9.3 7.6  HGB 8.6* 7.8*  HCT 26.0* 23.8*  PLT 202 202   BMET:  Recent Labs    03/30/17 0502 03/31/17 0406  NA 134* 132*  K 4.0 3.5  CL 95* 91*  CO2 29 25  GLUCOSE 113* 92  BUN 9 8  CREATININE 0.54 0.52  CALCIUM 8.8* 8.5*    PT/INR:  Recent Labs    03/31/17 0406  LABPROT 15.7*  INR 1.26   ABG    Component Value Date/Time   PHART 7.362 03/25/2017 0423   HCO3 21.7 03/25/2017 0423   TCO2 28 03/27/2017 1751   ACIDBASEDEF 3.0 (H) 03/25/2017 0423   O2SAT 95.0 03/25/2017 0423   CBG (last 3)  Recent Labs      03/30/17 2358 03/31/17 0329 03/31/17 0728  GLUCAP 124* 105* 96    Meds Scheduled Meds: . aspirin EC  325 mg Oral Daily   Or  . aspirin  324 mg Per Tube Daily  . bisacodyl  10 mg Oral Daily   Or  . bisacodyl  10 mg Rectal Daily  . Chlorhexidine Gluconate Cloth  6 each Topical Daily  . cycloSPORINE  1 drop Both Eyes Daily  . docusate sodium  200 mg Oral Daily  . fentaNYL  50 mcg Transdermal Q72H  . furosemide  40 mg Oral Daily  . isosorbide mononitrate  15 mg Oral Daily  . lactulose  20 g Oral Daily  . lidocaine  1 patch Transdermal Q24H  . Melatonin  6 mg Oral QHS  . metoprolol tartrate  12.5 mg Oral BID   Or  . metoprolol tartrate  12.5 mg Per Tube BID  . mupirocin ointment   Nasal BID  . pantoprazole  40 mg Oral Daily  . potassium chloride  20 mEq Oral BID  . sodium chloride flush  10-40 mL Intracatheter Q12H  . sodium chloride flush  3 mL Intravenous Q12H  . trimethoprim  100 mg Oral QODAY  . ursodiol  900 mg Oral Q breakfast   And  . ursodiol  600 mg Oral Q  supper   Continuous Infusions: . sodium chloride Stopped (03/25/17 1900)  . sodium chloride    . sodium chloride Stopped (03/25/17 1023)  . cefTAZidime (FORTAZ)  IV Stopped (03/31/17 0441)  . lactated ringers 10 mL/hr at 03/28/17 0600  . potassium chloride Stopped (03/27/17 1946)   PRN Meds:.sodium chloride, ALPRAZolam, metoprolol tartrate, morphine injection, potassium chloride, promethazine, sodium chloride flush, sodium chloride flush  Xrays Dg Chest 2 View  Addendum Date: 03/31/2017   ADDENDUM REPORT: 03/31/2017 07:47 ADDENDUM: Left PICC line is in place not right PICC line as initially dictated. Electronically Signed   By: Genia Del M.D.   On: 03/31/2017 07:47   Result Date: 03/31/2017 CLINICAL DATA:  47 year old female post CABG with shortness breath. Subsequent encounter. EXAM: CHEST  2 VIEW COMPARISON:  03/28/2017 chest x-ray. FINDINGS: Right PICC line tip cavoatrial junction/proximal right  atrium. To be within the distal superior vena cava, this can be retracted by 1.5 cm. Post CABG.  Cardiomegaly. Fluctuating diffuse airspace disease with slightly patchy nodular configuration may represent infectious process superimposed upon pulmonary edema with bilateral pleural effusions. No gross pneumothorax. No acute osseous abnormality. IMPRESSION: Fluctuating diffuse airspace disease with slightly patchy nodular configuration may represent infectious process superimposed upon pulmonary edema with bilateral pleural effusions. Right PICC line tip cavoatrial junction/proximal right atrium. To be within the distal superior vena cava, this can be retracted by 1.5 cm. Post CABG.  Cardiomegaly. Electronically Signed: By: Genia Del M.D. 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 Subsequent encounter. EXAM: PORTABLE CHEST 1 VIEW COMPARISON:  03/30/2017. FINDINGS: Left PICC line tip distal superior vena cava/cavoatrial junction level. Post CABG.  Cardiomegaly. Decrease nodular airspace disease. Residual diffuse airspace disease and bilateral pleural effusions suggestive of pulmonary edema with basilar atelectasis. Basilar infiltrates not excluded in proper clinical setting. No pneumothorax. IMPRESSION: Decrease nodular airspace disease. Residual diffuse airspace disease and bilateral pleural effusions suggestive of pulmonary edema with basilar atelectasis. Basilar infiltrates not excluded in proper clinical setting. Left PICC line tip distal superior vena cava/cavoatrial junction level. Post CABG.  Cardiomegaly. Electronically Signed   By: Genia Del M.D.   On: 03/31/2017 07:46   US Abdomen Limited Ruq  Result Date: 03/31/2017 CLINICAL DATA:  Abnormal LFTs. History of primary biliary cirrhosis. EXAM: ULTRASOUND ABDOMEN LIMITED RIGHT UPPER QUADRANT COMPARISON:  Right upper quadrant ultrasound dated November 19, 2015. FINDINGS: Gallbladder: No gallstones or wall thickening visualized. No sonographic Murphy sign noted by sonographer. Common bile duct: Diameter: 2 mm, normal. Liver: No focal lesion identified. Coarsened echotexture with normal echogenicity. Portal vein is patent on color Doppler imaging with normal direction of blood flow towards the liver. Incidental note is made of a small right pleural effusion. IMPRESSION: 1. Coarsened liver echotexture, suggestive of underlying hepatocellular disease. No focal hepatic abnormality. Electronically Signed   By: Titus Dubin M.D.   On: 03/31/2017 08:23    Assessment/Plan: S/P Procedure(s) (LRB): CORONARY ARTERY BYPASS GRAFTING (CABG) x two, using left internal mammary artery, left radial artery, and right leg greater saphenous vein harvested endoscopically (N/A) TRANSESOPHAGEAL ECHOCARDIOGRAM (TEE) (N/A) RADIAL ARTERY HARVEST (Left) ENDOVEIN HARVEST OF GREATER SAPHENOUS VEIN (Right)   1 currently feeling a little better, GI eval in progress 2 sinus rhythm, HR control is better 3 cont to diurese, renal fxn ok 4 H.H down a little further- monitor as is approaching transfusion threshold 5 wean O2 off as able,  push pulm toilet/rehab as able  LOS: 8 days    John Giovanni 03/31/2017  I have seen and examined the patient and agree with the assessment and plan as outlined.  Abdominal U/S reveals coarse liver parenchyma c/w cirrhosis but no complicating features nor other pathology.  Rexene Alberts, MD 03/31/2017

## 2017-03-31 NOTE — Addendum Note (Signed)
Addendum  created 03/31/17 0754 by Nolon Nations, MD   Intraprocedure Blocks edited, Pend clinical note, Sign clinical note

## 2017-03-31 NOTE — Plan of Care (Signed)
  Progressing Respiratory: Respiratory status will improve 03/31/2017 2021 - Progressing by Netta Corrigan, RN Note Pt is tolerating room air well. Pt is able to get incentive spirometer up to 750. RN will continue to work with patient on this.  Skin Integrity: Wound healing without signs and symptoms of infection 03/31/2017 2021 - Progressing by Netta Corrigan, RN Note Incision sites remain free from infection, clean, dry, and intact.  Urinary Elimination: Ability to achieve and maintain adequate renal perfusion and functioning will improve 03/31/2017 2021 - Progressing by Netta Corrigan, RN Note Pt is making adequate urine output.

## 2017-04-01 ENCOUNTER — Inpatient Hospital Stay (HOSPITAL_COMMUNITY): Payer: 59

## 2017-04-01 DIAGNOSIS — D689 Coagulation defect, unspecified: Secondary | ICD-10-CM | POA: Diagnosis not present

## 2017-04-01 DIAGNOSIS — J189 Pneumonia, unspecified organism: Secondary | ICD-10-CM | POA: Diagnosis not present

## 2017-04-01 DIAGNOSIS — J90 Pleural effusion, not elsewhere classified: Secondary | ICD-10-CM | POA: Diagnosis not present

## 2017-04-01 DIAGNOSIS — K743 Primary biliary cirrhosis: Secondary | ICD-10-CM | POA: Diagnosis not present

## 2017-04-01 DIAGNOSIS — K589 Irritable bowel syndrome without diarrhea: Secondary | ICD-10-CM | POA: Diagnosis not present

## 2017-04-01 DIAGNOSIS — J9811 Atelectasis: Secondary | ICD-10-CM | POA: Diagnosis not present

## 2017-04-01 DIAGNOSIS — I25110 Atherosclerotic heart disease of native coronary artery with unstable angina pectoris: Secondary | ICD-10-CM

## 2017-04-01 DIAGNOSIS — K219 Gastro-esophageal reflux disease without esophagitis: Secondary | ICD-10-CM | POA: Diagnosis not present

## 2017-04-01 DIAGNOSIS — D62 Acute posthemorrhagic anemia: Secondary | ICD-10-CM | POA: Diagnosis not present

## 2017-04-01 LAB — CBC
HCT: 28.1 % — ABNORMAL LOW (ref 36.0–46.0)
Hemoglobin: 9.3 g/dL — ABNORMAL LOW (ref 12.0–15.0)
MCH: 32 pg (ref 26.0–34.0)
MCHC: 33.1 g/dL (ref 30.0–36.0)
MCV: 96.6 fL (ref 78.0–100.0)
Platelets: 284 10*3/uL (ref 150–400)
RBC: 2.91 MIL/uL — ABNORMAL LOW (ref 3.87–5.11)
RDW: 15.6 % — ABNORMAL HIGH (ref 11.5–15.5)
WBC: 9.2 10*3/uL (ref 4.0–10.5)

## 2017-04-01 LAB — COMPREHENSIVE METABOLIC PANEL
ALT: 104 U/L — ABNORMAL HIGH (ref 14–54)
AST: 150 U/L — ABNORMAL HIGH (ref 15–41)
Albumin: 2.7 g/dL — ABNORMAL LOW (ref 3.5–5.0)
Alkaline Phosphatase: 217 U/L — ABNORMAL HIGH (ref 38–126)
Anion gap: 18 — ABNORMAL HIGH (ref 5–15)
BUN: 8 mg/dL (ref 6–20)
CO2: 29 mmol/L (ref 22–32)
Calcium: 8.9 mg/dL (ref 8.9–10.3)
Chloride: 82 mmol/L — ABNORMAL LOW (ref 101–111)
Creatinine, Ser: 0.72 mg/dL (ref 0.44–1.00)
GFR calc Af Amer: >60 mL/min (ref >60)
GFR calc non Af Amer: >60 mL/min (ref >60)
Glucose, Bld: 117 mg/dL — ABNORMAL HIGH (ref 65–99)
Potassium: 3.3 mmol/L — ABNORMAL LOW (ref 3.5–5.1)
Sodium: 129 mmol/L — ABNORMAL LOW (ref 135–145)
Total Bilirubin: 13.5 mg/dL — ABNORMAL HIGH (ref 0.3–1.2)
Total Protein: 6.7 g/dL (ref 6.5–8.1)

## 2017-04-01 LAB — HEPATITIS B SURFACE ANTIGEN: Hepatitis B Surface Ag: NEGATIVE

## 2017-04-01 LAB — ECHOCARDIOGRAM COMPLETE
Height: 62 in
Weight: 2525.59 oz

## 2017-04-01 MED ORDER — LACTULOSE 10 GM/15ML PO SOLN
30.0000 g | Freq: Every day | ORAL | Status: DC
  Administered 2017-04-01 – 2017-04-02 (×2): 30 g via ORAL
  Filled 2017-04-01 (×5): qty 45

## 2017-04-01 MED ORDER — FUROSEMIDE 10 MG/ML IJ SOLN: 40.0000 mg | Freq: Every day | INTRAMUSCULAR | Status: DC

## 2017-04-01 MED ORDER — FUROSEMIDE 40 MG PO TABS
40.0000 mg | ORAL_TABLET | Freq: Every day | ORAL | Status: DC
  Administered 2017-04-01 – 2017-04-03 (×3): 40 mg via ORAL
  Filled 2017-04-01 (×3): qty 1

## 2017-04-01 MED ORDER — METOPROLOL TARTRATE 12.5 MG HALF TABLET
12.5000 mg | ORAL_TABLET | Freq: Two times a day (BID) | ORAL | Status: DC
  Administered 2017-04-01 – 2017-04-03 (×4): 12.5 mg via ORAL
  Filled 2017-04-01 (×4): qty 1

## 2017-04-01 NOTE — Progress Notes (Signed)
Subjective: The patient was seen and examined at bedside. She reports improvement in nausea with use of Zofran. Had 3 bowel movements yesterday and today with lactulose and did not require suppositories.  Objective: Vital signs in last 24 hours: Temp:  [97.7 F (36.5 C)-98.5 F (36.9 C)] 97.7 F (36.5 C) (02/06 1118) Pulse Rate:  [92-101] 93 (02/06 0900) Resp:  [17-27] 24 (02/06 1230) BP: (89-107)/(46-67) 99/56 (02/06 1230) SpO2:  [91 %-100 %] 96 % (02/06 0900) Weight:  [71.6 kg (157 lb 13.6 oz)] 71.6 kg (157 lb 13.6 oz) (02/06 0500) Weight change: -6.691 kg (-12 oz) Last BM Date: 03/31/17  IR:WERX icterus, mild pallor GENERAL:in mild distress from slight chest pain ABDOMEN:soft, nondistended, nontender, normoactive bowel sounds EXTREMITIES:minimal bilateral pitting pedal edema  Lab Results: Results for orders placed or performed during the hospital encounter of 03/23/17 (from the past 48 hour(s))  Hepatitis B surface antigen     Status: None   Collection Time: 03/30/17  3:43 PM  Result Value Ref Range   Hepatitis B Surface Ag Negative Negative    Comment: (NOTE) Performed At: [facility] [place], [place] [id number] Rush Farmer MD PJ:[id number] Performed at [facility], [place] [address], [place], [place] [address]   Iron and TIBC     Status: Abnormal   Collection Time: 03/30/17  4:00 PM  Result Value Ref Range   Iron 36 28 - 170 ug/dL   TIBC 221 (L) 250 - 450 ug/dL   Saturation Ratios 16 10.4 - 31.8 %   UIBC 185 ug/dL    Comment: Performed at [place] [address], [place], [place] [address]  Ferritin     Status: Abnormal   Collection Time: 03/30/17  4:00 PM  Result Value Ref Range   Ferritin 313 (H) 11 - 307 ng/mL    Comment: Performed at [facility], [place] [address] [place], [place] [address]  Hepatitis C antibody     Status: None   Collection Time: 03/30/17  4:00 PM  Result Value Ref Range   HCV Ab <0.1 0.0 -  0.9 s/co ratio    Comment: (NOTE)                                  Negative:     < 0.8                             Indeterminate: 0.8 - 0.9                                  Positive:     > 0.9 The CDC recommends that a positive HCV antibody result be followed up with a HCV Nucleic Acid Amplification test (673419). Performed At: [facility] [place], [place] [id number] Rush Farmer MD DZ:[id number] Performed at [facility], [place] [address] [place], [place] [address]   Hepatitis B core antibody, IgM     Status: None   Collection Time: 03/30/17  4:00 PM  Result Value Ref Range   Hep B C IgM Negative Negative    Comment: (NOTE) Performed At: [facility] [address] [place], [place] [id number] Rush Farmer MD [id number] Performed at [facility], [place] [address] [place], [place]  [address]   Mitochondrial antibodies     Status: Abnormal   Collection Time: 03/30/17  4:00 PM  Result Value Ref Range   Mitochondrial M2 Ab, IgG 162.2 (H) 0.0 - 20.0 Units    Comment: (NOTE)                                Negative    0.0 - 20.0                                Equivocal  20.1 - 24.9                                Positive         >24.9 Mitochondrial (M2) Antibodies are found in 90-96% of patients with primary biliary cirrhosis. Performed At: [facility] [place], [place] [id number] Rush Farmer MD JS:[id number] Performed at [facility], [place] [address], [place], [place] [address]   Anti-smooth muscle antibody, IgG     Status: None   Collection Time: 03/30/17  4:00 PM  Result Value Ref Range   F-Actin IgG 12 0 - 19 Units    Comment: (NOTE)                 Negative                     0 - 19                 Weak positive               20 - 30                 Moderate to strong positive     >30 Actin Antibodies are found in 52-85% of patients with autoimmune hepatitis or chronic active hepatitis  and in 22% of patients with primary biliary cirrhosis. Performed At: [facility] [place], [place] [id number] Rush Farmer MD OE:[id number] Performed at [facility], [place] [address], [place], [place] [address]   Ceruloplasmin     Status: Abnormal   Collection Time: 03/30/17  4:00 PM  Result Value Ref Range   Ceruloplasmin 45.3 (H) 19.0 - 39.0 mg/dL    Comment: (NOTE) Performed At: [facility] [place], [place] [id number] Rush Farmer MD [id number] Performed at [facility], [place] [address] [place], [place] [address]   Alpha-1-antitrypsin     Status: Abnormal   Collection Time: 03/30/17  4:00 PM  Result Value Ref Range   A-1 Antitrypsin, Ser 273 (H) 90 - 200 mg/dL    Comment: (NOTE) Performed At: [facility] [place], [place] [id number] Rush Farmer MD [id number] Performed at [facility], [place] [address], [place], [place] [address]   Protime-INR     Status: Abnormal   Collection Time: 03/30/17  4:00 PM  Result Value Ref Range   Prothrombin Time 15.4 (H) 11.4 - 15.2 seconds   INR 1.23     Comment: Performed at [place] [address], [place], [place] [address]  Glucose, capillary     Status: Abnormal   Collection Time: 03/30/17  4:29 PM  Result Value Ref Range   Glucose-Capillary 119 (H)  65 - 99 mg/dL   Comment 1 Capillary Specimen   Glucose, capillary     Status: Abnormal   Collection Time: 03/30/17  7:32 PM  Result Value Ref Range   Glucose-Capillary 104 (H) 65 - 99 mg/dL   Comment 1 Capillary Specimen   Glucose, capillary     Status: Abnormal   Collection Time: 03/30/17 11:58 PM  Result Value Ref Range   Glucose-Capillary 124 (H) 65 - 99 mg/dL   Comment 1 Capillary Specimen   Glucose, capillary     Status: Abnormal   Collection Time: 03/31/17  3:29 AM  Result Value Ref Range   Glucose-Capillary 105 (H) 65 - 99 mg/dL   Comment 1 Capillary Specimen    CBC     Status: Abnormal   Collection Time: 03/31/17  4:06 AM  Result Value Ref Range   WBC 7.6 4.0 - 10.5 K/uL   RBC 2.40 (L) 3.87 - 5.11 MIL/uL   Hemoglobin 7.8 (L) 12.0 - 15.0 g/dL   HCT 23.8 (L) 36.0 - 46.0 %   MCV 99.2 78.0 - 100.0 fL   MCH 32.5 26.0 - 34.0 pg   MCHC 32.8 30.0 - 36.0 g/dL   RDW 16.1 (H) 11.5 - 15.5 %   Platelets 202 150 - 400 K/uL    Comment: Performed at [facility], [place] [address] [place], [place] [address]  Comprehensive metabolic panel     Status: Abnormal   Collection Time: 03/31/17  4:06 AM  Result Value Ref Range   Sodium 132 (L) 135 - 145 mmol/L   Potassium 3.5 3.5 - 5.1 mmol/L   Chloride 91 (L) 101 - 111 mmol/L   CO2 25 22 - 32 mmol/L   Glucose, Bld 92 65 - 99 mg/dL   BUN 8 6 - 20 mg/dL   Creatinine, Ser 0.52 0.44 - 1.00 mg/dL   Calcium 8.5 (L) 8.9 - 10.3 mg/dL   Total Protein 5.2 (L) 6.5 - 8.1 g/dL   Albumin 2.3 (L) 3.5 - 5.0 g/dL   AST 90 (H) 15 - 41 U/L   ALT 66 (H) 14 - 54 U/L   Alkaline Phosphatase 132 (H) 38 - 126 U/L   Total Bilirubin 11.6 (H) 0.3 - 1.2 mg/dL   GFR calc non Af Amer >60 >60 mL/min   GFR calc Af Amer >60 >60 mL/min    Comment: (NOTE) The eGFR has been calculated using the CKD EPI equation. This calculation has not been validated in all clinical situations. eGFR's persistently <60 mL/min signify possible Chronic Kidney Disease.    Anion gap 16 (H) 5 - 15    Comment: Performed at [facility], [place] [address] [place], [place] [address]  Protime-INR     Status: Abnormal   Collection Time: 03/31/17  4:06 AM  Result Value Ref Range   Prothrombin Time 15.7 (H) 11.4 - 15.2 seconds   INR 1.26     Comment: Performed at [place] [address], [place], [place] [address]  Hepatitis B surface antigen     Status: None   Collection Time: 03/31/17  4:38 AM  Result Value Ref Range   Hepatitis B Surface Ag Negative Negative    Comment: (NOTE) Performed At: [facility] [place], [place] [id number] Rush Farmer MD [id number] Performed at [facility], [place] [address], [place], [place] [address]   Glucose, capillary     Status: None   Collection  Time: 03/31/17  7:28 AM  Result Value Ref Range   Glucose-Capillary 96 65 - 99 mg/dL   Comment 1 Capillary Specimen    Comment 2 Notify RN   Glucose, capillary     Status: Abnormal   Collection Time: 03/31/17 11:56 AM  Result Value Ref Range   Glucose-Capillary 115 (H) 65 - 99 mg/dL   Comment 1 Capillary Specimen    Comment 2 Notify RN   Glucose, capillary     Status: Abnormal   Collection Time: 03/31/17  3:58 PM  Result Value Ref Range   Glucose-Capillary 121 (H) 65 - 99 mg/dL   Comment 1 Capillary Specimen    Comment 2 Notify RN   Glucose, capillary     Status: Abnormal   Collection Time: 03/31/17  7:27 PM  Result Value Ref Range   Glucose-Capillary 106 (H) 65 - 99 mg/dL   Comment 1 Capillary Specimen   CBC     Status: Abnormal   Collection Time: 04/01/17  4:23 AM  Result Value Ref Range   WBC 9.2 4.0 - 10.5 K/uL   RBC 2.91 (L) 3.87 - 5.11 MIL/uL   Hemoglobin 9.3 (L) 12.0 - 15.0 g/dL   HCT 28.1 (L) 36.0 - 46.0 %   MCV 96.6 78.0 - 100.0 fL   MCH 32.0 26.0 - 34.0 pg   MCHC 33.1 30.0 - 36.0 g/dL   RDW 15.6 (H) 11.5 - 15.5 %   Platelets 284 150 - 400 K/uL    Comment: Performed at [facility], [place]. [address], [place], [place] [address]  Comprehensive metabolic panel     Status: Abnormal   Collection Time: 04/01/17  8:40 AM  Result Value Ref Range   Sodium 129 (L) 135 - 145 mmol/L   Potassium 3.3 (L) 3.5 - 5.1 mmol/L   Chloride 82 (L) 101 - 111 mmol/L   CO2 29 22 - 32 mmol/L   Glucose, Bld 117 (H) 65 - 99 mg/dL   BUN 8 6 - 20 mg/dL   Creatinine, Ser 0.72 0.44 - 1.00 mg/dL   Calcium 8.9 8.9 - 10.3 mg/dL   Total Protein 6.7 6.5 - 8.1 g/dL   Albumin 2.7 (L) 3.5 - 5.0 g/dL   AST 150 (H) 15 - 41 U/L   ALT 104 (H) 14 - 54 U/L   Alkaline Phosphatase 217 (H) 38 - 126 U/L   Total  Bilirubin 13.5 (H) 0.3 - 1.2 mg/dL   GFR calc non Af Amer >60 >60 mL/min   GFR calc Af Amer >60 >60 mL/min    Comment: (NOTE) The eGFR has been calculated using the CKD EPI equation. This calculation has not been validated in all clinical situations. eGFR's persistently <60 mL/min signify possible Chronic Kidney Disease.    Anion gap 18 (H) 5 - 15    Comment: Performed at [facility], [place] 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 Subsequent encounter. EXAM: PORTABLE CHEST 1 VIEW COMPARISON:  03/30/2017. FINDINGS: Left PICC line tip distal superior vena cava/cavoatrial junction level. Post CABG.  Cardiomegaly. Decrease nodular airspace disease. Residual diffuse airspace disease and bilateral pleural effusions suggestive of pulmonary edema with basilar atelectasis. Basilar infiltrates not excluded in proper clinical setting. No pneumothorax. IMPRESSION: Decrease nodular airspace disease. Residual diffuse airspace disease and bilateral pleural effusions suggestive of pulmonary edema with basilar atelectasis. Basilar  infiltrates not excluded in proper clinical setting. Left PICC line tip distal superior vena cava/cavoatrial junction level. Post CABG.  Cardiomegaly. Electronically Signed   By: Genia Del M.D.   On: 03/31/2017 07:46   US Abdomen Limited Ruq  Result Date: 03/31/2017 CLINICAL DATA:  Abnormal LFTs. History of primary biliary cirrhosis. EXAM: ULTRASOUND ABDOMEN LIMITED RIGHT UPPER QUADRANT COMPARISON:  Right upper quadrant ultrasound dated November 19, 2015. FINDINGS: Gallbladder: No gallstones or wall thickening visualized. No sonographic Murphy sign noted by sonographer. Common bile duct: Diameter: 2 mm, normal. Liver: No focal lesion identified. Coarsened echotexture with normal echogenicity. Portal vein is patent on color Doppler imaging with normal  direction of blood flow towards the liver. Incidental note is made of a small right pleural effusion. IMPRESSION: 1. Coarsened liver echotexture, suggestive of underlying hepatocellular disease. No focal hepatic abnormality. Electronically Signed   By: Titus Dubin M.D.   On: 03/31/2017 08:23    Medications: I have reviewed the patient's current medications.  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 Elevated liver enzymes, T bili 13.5/AST 150/AST 104/alkaline phosphatase 217 Workup for other liver disease unremarkable, ultrasound shows increased echotexture of liver compatible with diagnosis of PBC.  Plan: Discontinue trimethoprim,discontinue IV Ceftazidime unless absolutely indicated, avoid any hepatotoxic medications. Avoid statins until his LFTs have improved to a certain extent, expect total bilirubin to remain high and may not normalize for several weeks postsurgery.  Continue ursodiol 900 mg in a.m. and 600 mg in p.m.Marland Kitchen Patient is being planned for discharge either tomorrow or on Friday, recommend to follow up with me in the office in 1-2 weeks with LFTs.    [name] 04/01/2017, 1:39 PM   Pager [phone] If no answer or after 5 PM call [phone]

## 2017-04-01 NOTE — Progress Notes (Signed)
  Echocardiogram 2D Echocardiogram has been performed.  Christine Butler F 04/01/2017, 3:37 PM

## 2017-04-01 NOTE — Progress Notes (Signed)
Patient arrived from 2 Heart. Patient is alert and oriented. VS are stable. Patient mid sternal incision is clean dry and intact. Rt. Radial staples are clean dry and intact. Rt inner leg slightly bruised incision is clean dry and intact. Patient is resting comfortably in bed.

## 2017-04-01 NOTE — Progress Notes (Signed)
Epicardial wires removed at bedside. Patient tolerated well, ends assessed with no tissue present, will continue to monitor vital signs.

## 2017-04-01 NOTE — Plan of Care (Signed)
  Progressing Clinical Measurements: Will remain free from infection 04/01/2017 2350 - Progressing by Blair Promise, RN Respiratory complications will improve 04/01/2017 2350 - Progressing by Blair Promise, RN Cardiovascular complication will be avoided 04/01/2017 2350 - Progressing by Blair Promise, RN

## 2017-04-01 NOTE — Progress Notes (Signed)
8 Days Post-Op Procedure(s) (LRB): CORONARY ARTERY BYPASS GRAFTING (CABG) x two, using left internal mammary artery, left radial artery, and right leg greater saphenous vein harvested endoscopically (N/A) TRANSESOPHAGEAL ECHOCARDIOGRAM (TEE) (N/A) RADIAL ARTERY HARVEST (Left) ENDOVEIN HARVEST OF GREATER SAPHENOUS VEIN (Right) Subjective: Feels better, looks better nsr Ready for stepdown Objective: Vital signs in last 24 hours: Temp:  [97.8 F (36.6 C)-98.5 F (36.9 C)] 97.8 F (36.6 C) (02/06 0700) Pulse Rate:  [92-125] 95 (02/06 0800) Cardiac Rhythm: Normal sinus rhythm;Sinus tachycardia (02/05 2000) Resp:  [17-28] 21 (02/06 0800) BP: (86-107)/(46-67) 93/65 (02/06 0800) SpO2:  [91 %-100 %] 98 % (02/06 0800) Weight:  [157 lb 13.6 oz (71.6 kg)] 157 lb 13.6 oz (71.6 kg) (02/06 0500)  Hemodynamic parameters for last 24 hours:    Intake/Output from previous day: 02/05 0701 - 02/06 0700 In: 1890 [P.O.:1740; IV Piggyback:150] Out: 2002 [Urine:2000; Stool:2] Intake/Output this shift: No intake/output data recorded.      Physical Exam  Icterus better Incisions clean Lungs clear Lab Results: Recent Labs    03/31/17 0406 04/01/17 0423  WBC 7.6 9.2  HGB 7.8* 9.3*  HCT 23.8* 28.1*  PLT 202 284   BMET:  Recent Labs    03/30/17 0502 03/31/17 0406  NA 134* 132*  K 4.0 3.5  CL 95* 91*  CO2 29 25  GLUCOSE 113* 92  BUN 9 8  CREATININE 0.54 0.52  CALCIUM 8.8* 8.5*    PT/INR:  Recent Labs    03/31/17 0406  LABPROT 15.7*  INR 1.26   ABG    Component Value Date/Time   PHART 7.362 03/25/2017 0423   HCO3 21.7 03/25/2017 0423   TCO2 28 03/27/2017 1751   ACIDBASEDEF 3.0 (H) 03/25/2017 0423   O2SAT 95.0 03/25/2017 0423   CBG (last 3)  Recent Labs    03/31/17 1156 03/31/17 1558 03/31/17 1927  GLUCAP 115* 121* 106*    Assessment/Plan: S/P Procedure(s) (LRB): CORONARY ARTERY BYPASS GRAFTING (CABG) x two, using left internal mammary artery, left radial  artery, and right leg greater saphenous vein harvested endoscopically (N/A) TRANSESOPHAGEAL ECHOCARDIOGRAM (TEE) (N/A) RADIAL ARTERY HARVEST (Left) ENDOVEIN HARVEST OF GREATER SAPHENOUS VEIN (Right) Mobilize Diuresis Plan for transfer to step-down: see transfer orders   LOS: 9 days    Christine Butler 04/01/2017

## 2017-04-01 NOTE — Care Management Note (Signed)
Case Management Note  Patient Details  Name: Christine Butler MRN: [id number] Date of Birth: 10-08-1970  Subjective/Objective:   From home alone, pta indep, POD 8 CABG, radical artery harvest,  Biliary cholangitis- ( GI following) cont to diuresis, plan for home Thursday or Friday.  She states her sister Christine Butler will be with her at home after discharge 24/7 for assistance. , also she sees [employer], she has medication coverage.                Action/Plan: NCM will follow for dc needs.   Expected Discharge Date:  04/01/17               Expected Discharge Plan:  Home/Self Care  In-House Referral:     Discharge planning Services  CM Consult  Post Acute Care Choice:    Choice offered to:     DME Arranged:    DME Agency:     HH Arranged:    HH Agency:     Status of Service:  In process, will continue to follow  If discussed at Long Length of Stay Meetings, dates discussed:    Additional Comments:  Zenon Mayo, RN 04/01/2017, 1:45 PM

## 2017-04-02 ENCOUNTER — Inpatient Hospital Stay (HOSPITAL_COMMUNITY): Payer: 59

## 2017-04-02 LAB — CBC
HCT: 28.3 % — ABNORMAL LOW (ref 36.0–46.0)
Hemoglobin: 9.3 g/dL — ABNORMAL LOW (ref 12.0–15.0)
MCH: 31.5 pg (ref 26.0–34.0)
MCHC: 32.9 g/dL (ref 30.0–36.0)
MCV: 95.9 fL (ref 78.0–100.0)
Platelets: 352 10*3/uL (ref 150–400)
RBC: 2.95 MIL/uL — ABNORMAL LOW (ref 3.87–5.11)
RDW: 15.1 % (ref 11.5–15.5)
WBC: 12.4 10*3/uL — ABNORMAL HIGH (ref 4.0–10.5)

## 2017-04-02 LAB — COMPREHENSIVE METABOLIC PANEL
ALT: 115 U/L — ABNORMAL HIGH (ref 14–54)
AST: 159 U/L — ABNORMAL HIGH (ref 15–41)
Albumin: 2.6 g/dL — ABNORMAL LOW (ref 3.5–5.0)
Alkaline Phosphatase: 232 U/L — ABNORMAL HIGH (ref 38–126)
Anion gap: 15 (ref 5–15)
BUN: 8 mg/dL (ref 6–20)
CO2: 31 mmol/L (ref 22–32)
Calcium: 8.6 mg/dL — ABNORMAL LOW (ref 8.9–10.3)
Chloride: 81 mmol/L — ABNORMAL LOW (ref 101–111)
Creatinine, Ser: 0.74 mg/dL (ref 0.44–1.00)
GFR calc Af Amer: >60 mL/min (ref >60)
GFR calc non Af Amer: >60 mL/min (ref >60)
Glucose, Bld: 108 mg/dL — ABNORMAL HIGH (ref 65–99)
Potassium: 3.6 mmol/L (ref 3.5–5.1)
Sodium: 127 mmol/L — ABNORMAL LOW (ref 135–145)
Total Bilirubin: 12.2 mg/dL — ABNORMAL HIGH (ref 0.3–1.2)
Total Protein: 6.5 g/dL (ref 6.5–8.1)

## 2017-04-02 MED ORDER — SODIUM CHLORIDE 0.9 % IV SOLN
INTRAVENOUS | Status: DC
Start: 2017-04-02 — End: 2017-04-03
  Administered 2017-04-02: 11:00:00 via INTRAVENOUS

## 2017-04-02 NOTE — Discharge Instructions (Signed)
Endoscopic Saphenous Vein Harvesting, Care After °Refer to this sheet in the next few weeks. These instructions provide you with information about caring for yourself after your procedure. Your health care provider may also give you more specific instructions. Your treatment has been planned according to current medical practices, but problems sometimes occur. Call your health care provider if you have any problems or questions after your procedure. °What can I expect after the procedure? °After the procedure, it is common to have: °· Pain. °· Bruising. °· Swelling. °· Numbness. ° °Follow these instructions at home: °Medicine °· Take over-the-counter and prescription medicines only as told by your health care provider. °· Do not drive or operate heavy machinery while taking prescription pain medicine. °Incision care ° °· Follow instructions from your health care provider about how to take care of the cut made during surgery (incision). Make sure you: °? Wash your hands with soap and water before you change your bandage (dressing). If soap and water are not available, use hand sanitizer. °? Change your dressing as told by your health care provider. °? Leave stitches (sutures), skin glue, or adhesive strips in place. These skin closures may need to be in place for 2 weeks or longer. If adhesive strip edges start to loosen and curl up, you may trim the loose edges. Do not remove adhesive strips completely unless your health care provider tells you to do that. °· Check your incision area every day for signs of infection. Check for: °? More redness, swelling, or pain. °? More fluid or blood. °? Warmth. °? Pus or a bad smell. °General instructions °· Raise (elevate) your legs above the level of your heart while you are sitting or lying down. °· Do any exercises your health care providers have given you. These may include deep breathing, coughing, and walking exercises. °· Do not shower, take baths, swim, or use a hot tub  unless told by your health care provider. °· Wear your elastic stocking if told by your health care provider. °· Keep all follow-up visits as told by your health care provider. This is important. °Contact a health care provider if: °· Medicine does not help your pain. °· Your pain gets worse. °· You have new leg bruises or your leg bruises get bigger. °· You have a fever. °· Your leg feels numb. °· You have more redness, swelling, or pain around your incision. °· You have more fluid or blood coming from your incision. °· Your incision feels warm to the touch. °· You have pus or a bad smell coming from your incision. °Get help right away if: °· Your pain is severe. °· You develop pain, tenderness, warmth, redness, or swelling in any part of your leg. °· You have chest pain. °· You have trouble breathing. °This information is not intended to replace advice given to you by your health care provider. Make sure you discuss any questions you have with your health care provider. °Document Released: 10/23/2010 Document Revised: 07/19/2015 Document Reviewed: 12/25/2014 °Elsevier Interactive Patient Education © 2018 [employer] °Coronary Artery Bypass Grafting, Care After °These instructions give you information on caring for yourself after your procedure. Your doctor may also give you more specific instructions. Call your doctor if you have any problems or questions after your procedure. °Follow these instructions at home: °· Only take medicine as told by your doctor. Take medicines exactly as told. Do not stop taking medicines or start any new medicines without talking to your doctor first. °·   Take your pulse as told by your doctor. °· Do deep breathing as told by your doctor. Use your breathing device (incentive spirometer), if given, to practice deep breathing several times a day. Support your chest with a pillow or your arms when you take deep breaths or cough. °· Keep the area clean, dry, and protected where the  surgery cuts (incisions) were made. Remove bandages (dressings) only as told by your doctor. If strips were applied to surgical area, do not take them off. They fall off on their own. °· Check the surgery area daily for puffiness (swelling), redness, or leaking fluid. °· If surgery cuts were made in your legs: °? Avoid crossing your legs. °? Avoid sitting for long periods of time. Change positions every 30 minutes. °? Raise your legs when you are sitting. Place them on pillows. °· Wear stockings that help keep blood clots from forming in your legs (compression stockings). °· Only take sponge baths until your doctor says it is okay to take showers. Pat the surgery area dry. Do not rub the surgery area with a washcloth or towel. Do not bathe, swim, or use a hot tub until your doctor says it is okay. °· Eat foods that are high in fiber. These include raw fruits and vegetables, whole grains, beans, and nuts. Choose lean meats. Avoid canned, processed, and fried foods. °· Drink enough fluids to keep your pee (urine) clear or pale yellow. °· Weigh yourself every day. °· Rest and limit activity as told by your doctor. You may be told to: °? Stop any activity if you have chest pain, shortness of breath, changes in heartbeat, or dizziness. Get help right away if this happens. °? Move around often for short amounts of time or take short walks as told by your doctor. Gradually become more active. You may need help to strengthen your muscles and build endurance. °? Avoid lifting, pushing, or pulling anything heavier than 10 pounds (4.5 kg) for at least 6 weeks after surgery. °· Do not drive until your doctor says it is okay. °· Ask your doctor when you can go back to work. °· Ask your doctor when you can begin sexual activity again. °· Follow up with your doctor as told. °Contact a doctor if: °· You have puffiness, redness, more pain, or fluid draining from the incision site. °· You have a fever. °· You have puffiness in your  ankles or legs. °· You have pain in your legs. °· You gain 2 or more pounds (0.9 kg) a day. °· You feel sick to your stomach (nauseous) or throw up (vomit). °· You have watery poop (diarrhea). °Get help right away if: °· You have chest pain that goes to your jaw or arms. °· You have shortness of breath. °· You have a fast or irregular heartbeat. °· You notice a "clicking" in your breastbone when you move. °· You have numbness or weakness in your arms or legs. °· You feel dizzy or light-headed. °This information is not intended to replace advice given to you by your health care provider. Make sure you discuss any questions you have with your health care provider. °Document Released: 02/15/2013 Document Revised: 07/19/2015 Document Reviewed: 07/20/2012 °Elsevier Interactive Patient Education © 2017 Elsevier Inc. ° °

## 2017-04-02 NOTE — Discharge Summary (Signed)
Physician Discharge Summary  Patient ID: Christine Butler MRN: [id number] DOB/AGE: [birth year] 47 y.o.  Admit date: 03/23/2017 Discharge date: 04/03/2017  Admission Diagnoses: Severe coronary artery disease  Discharge Diagnoses:  Active Problems:   Coronary artery disease involving native coronary artery of native heart with unstable angina pectoris (HCC)   CAD (coronary artery disease), native coronary artery   Coronary artery disease   S/P CABG x 2   Patient Active Problem List   Diagnosis Date Noted  . Coronary artery disease 03/24/2017  . S/P CABG x 2 03/24/2017  . CAD (coronary artery disease), native coronary artery 03/23/2017  . Coronary artery disease involving native coronary artery of native heart with unstable angina pectoris ([place])   . Angina pectoris ([place]) 03/16/2017  . 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 GERD (gastroesophageal reflux disease) 01/28/2012  . 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 For the past few months she has had symptoms of exertional chest pain. She was evaluated in the ED in October 2018. She underwent a echo which was normal. She underwent a GI evaluation with upper endoscopy which was normal. The pain has persisted and she was evaluated by cardiology and recommended for cardiac catheterization. This was performed today as an outpatient via right radial artery by Dr.Varanasi. The patient has a high-grade 95% stenosis of the LAD diagonal bifurcation. LV function is preserved. LVEDP is normal. She is been placed on IV nitroglycerin and heparin because of accelerating chest pain. She was not felt to be a candidate for percutaneous intervention-stent due to the coronary  anatomy and small vessel size. CABG was recommended by her cardiologist.  The patient has a very strong family history of coronary disease with multiple family members having had CABG, PCI, or MI. The patient does not smoke. She does not have hypertension. She is unsure of her lipid panel but has not been told she has hyper lipidemia. She is not diabetic.  He was seen in cardiothoracic surgical consultation by Dr. Darcey Nora who recommended CABG.   Discharged Condition: good  Hospital Course: Following cardiac catheterization the patient was felt to require cardiothoracic surgical consultation due to the findings of severe coronary artery disease.  She was seen in consultation by Dr. Darcey Nora who evaluated the patient and her studies and agree with recommendations to proceed with surgical revascularization.  On 03/24/2017 she was taken to the operating room at which time she underwent the below described procedure.  She tolerated well was taken to the surgical intensive care unit in stable condition  Postoperative hospital course: The patient has progressed well overall.  She was extubated without difficulty using standard protocols.  It was noted early on that she had a significant bump in her bilirubin to 8.0 and hepatotoxic medications were held.  Additionally GI medicine consultation was obtained to assist with management of her severe liver disease.  She had significant nausea and vomiting during the postoperative period as well.  She has a known history of primary biliary cholangitis.  She also had some difficulty with constipation.  She has maintained stable hemodynamics and sinus rhythm.  She does have postoperative volume overload which is improving with diuretics.  She does have expected acute blood loss anemia.  Most recent  hemoglobin and hematocrit dated 04/02/2017 are 9.3 and 28.3 respectively.  Her incisions are healing well without evidence of infection.  Her left hand is neurovascularly  intact and she is on IMDUR for radial artery graft.  Her blood pressure has been too low to initiate ACE inhibitor or ARB.  She is not started on a statin due to her severe liver disease.  Oxygen has been weaned and she maintains good saturations on room air.  She is tolerating gradually increasing activities using standard cardiac rehab protocols.  At time of discharge the patient is felt to be quite stable.  Consults: GI  Significant Diagnostic Studies: angiography: cardiac cath LEFT HEART CATH AND CORONARY ANGIOGRAPHY  Conclusion     Mid LAD lesion is 99% stenosed.  Ost LM lesion is 40% stenosed.  Prox RCA lesion is 25% stenosed.  The left ventricular systolic function is normal.  LV end diastolic pressure is normal.  The left ventricular ejection fraction is 55-65% by visual estimate.  There is no aortic valve stenosis.  If cath was needed in the future, would not use radial approach.   Severe LAD disease at a bifurcation of a large diagonal vessel.  Lesion is suboptimal for PCI.       Treatments: surgery:   DATE OF PROCEDURE:  03/24/2017 DATE OF DISCHARGE:                              OPERATIVE REPORT   OPERATIONS: 1. Coronary artery bypass grafting x2 (left internal mammary artery to     dominant diagonal, left radial artery free graft to left anterior     descending with interposition vein graft at the proximal     anastomosis). 2. Harvest of left radial artery as a free graft. 3. Right greater saphenous vein endoscopic harvest.  SURGEON:  Ivin Poot, MD.  ASSISTANT:  Lars Pinks, PA-C.  PREOPERATIVE DIAGNOSES:  Unstable angina, severe 2-vessel coronary artery disease.  POSTOPERATIVE DIAGNOSES:  Unstable angina, severe 2-vessel coronary artery disease.  ANESTHESIA:  General.   Discharge Exam: Blood pressure (!) 96/59, pulse 84, temperature 97.8 F (36.6 C), temperature source Oral, resp. rate 16, height 5\' 2"  (1.575 m),  weight 161 lb 1.6 oz (73.1 kg), last menstrual period 03/12/2017, SpO2 95 %.   General appearance: alert, cooperative and no distress Heart: regular rate and rhythm, S1, S2 normal, no murmur, click, rub or gallop Lungs: clear to auscultation bilaterally Abdomen: soft, non-tender; bowel sounds normal; no masses,  no organomegaly Extremities: extremities normal, atraumatic, no cyanosis or edema Wound: clean and dry    Disposition: 01-Home or Self Care  Discharge Instructions    Amb Referral to Cardiac Rehabilitation   Complete by:  As directed    Diagnosis:  CABG   CABG X ___:  2     Allergies as of 04/03/2017      Reactions   Codeine Nausea And Vomiting   Erythromycin Nausea And Vomiting   Fentanyl Nausea And Vomiting      Medication List    STOP taking these medications   ibuprofen 200 MG tablet Commonly known as:  ADVIL,MOTRIN   lidocaine 5 % Commonly known as:  LIDODERM   nitroGLYCERIN 0.4 MG SL tablet Commonly known as:  NITROSTAT   trimethoprim 100 MG tablet Commonly known as:  TRIMPEX     TAKE these medications   ALPRAZolam 0.25 MG tablet Commonly known as:  XANAX Take  1 tablet (0.25 mg total) by mouth 2 (two) times daily as needed for anxiety or sleep.   aspirin 325 MG EC tablet Take 1 tablet (325 mg total) by mouth daily. What changed:    medication strength  how much to take   CALCIUM + D PO Take 1 tablet by mouth 2 (two) times a week.   cholecalciferol 1000 units tablet Commonly known as:  VITAMIN D Take 2,000 Units by mouth 2 (two) times a week.   cycloSPORINE 0.05 % ophthalmic emulsion Commonly known as:  RESTASIS Place 1 drop into both eyes daily.   diphenhydrAMINE 25 mg capsule Commonly known as:  BENADRYL Take 50 mg by mouth at bedtime as needed for itching.   furosemide 40 MG tablet Commonly known as:  LASIX Take 1 tablet (40 mg total) by mouth daily for 3 days.   isosorbide mononitrate 30 MG 24 hr tablet Commonly known as:   IMDUR Take 0.5 tablets (15 mg total) by mouth daily.   lactulose 10 GM/15ML solution Commonly known as:  CHRONULAC Take 45 mLs (30 g total) by mouth daily.   Melatonin 3 MG Tabs Take 6 mg by mouth at bedtime.   metoprolol tartrate 25 MG tablet Commonly known as:  LOPRESSOR Take 0.5 tablets (12.5 mg total) by mouth 2 (two) times daily.   multivitamin with minerals Tabs tablet Take 1 tablet by mouth 2 (two) times a week.   omeprazole 10 MG capsule Commonly known as:  PRILOSEC Take 20 mg by mouth daily.   ondansetron 4 MG tablet Commonly known as:  ZOFRAN Take 1 tablet (4 mg total) by mouth every 8 (eight) hours as needed for nausea or vomiting.   potassium chloride SA 20 MEQ tablet Commonly known as:  K-DUR,KLOR-CON Take 1 tablet (20 mEq total) by mouth 2 (two) times daily for 3 days.   PROBIOTIC PO Take 1 tablet by mouth daily.   traMADol 50 MG tablet Commonly known as:  ULTRAM Take 1 tablet (50 mg total) by mouth every 6 (six) hours as needed.   ursodiol 300 MG capsule Commonly known as:  ACTIGALL Take 600-900 mg by mouth 2 (two) times daily. 900 mg in am,  600 mg in pm      Follow-up Information    Ivin Poot, MD Follow up.   Specialty:  Cardiothoracic Surgery Why:  Appointment to see the surgeon on 04/29/2017 at 2 PM.  Please obtain a chest x-ray at [facility] at 1:30 PM.  [facility] imaging is located in the same office complex on the first floor. Contact information: [address] [place] [address] [phone]        Jettie Booze, MD Follow up.   Specialties:  Cardiology, Radiology, Interventional Cardiology Why:  Please see discharge paperwork for 2-week cardiology follow-up appointment.  If it is not listed in the paperwork please contact the office to arrange. Contact information: [address] [address] [phone]        [name], MD Follow up.   Specialty:   Gastroenterology Why:  The office will contact you about a 2-week follow-up appointment. Contact information: [place] [address] [phone]        nursing appointment Follow up.   Why:  Your appointment is on 04/10/2017 at 10:45pm for your staple and chest tube suture removal. Contact information: Dr. Lucianne Lei Trigt's office  The patient has been discharged on:   1.Beta Blocker:  Yes Blue.Reese   ]                              No   [   ]                              If No, reason:  2.Ace Inhibitor/ARB: Yes [   ]                                     No  [ n   ]                                     If No, reason:low bp  3.Statin:   Yes [   ]                  No  [ n  ]                  If No, reason:liver disease  4.Shela CommonsVelta Addison  [ y  ]                  No   [   ]                  If No, reason:  Signed: Elgie Collard 04/03/2017, 10:01 AM

## 2017-04-02 NOTE — Progress Notes (Addendum)
[address]       [place] [address]             [phone]      9 Days Post-Op Procedure(s) (LRB): CORONARY ARTERY BYPASS GRAFTING (CABG) x two, using left internal mammary artery, left radial artery, and right leg greater saphenous vein harvested endoscopically (N/A) TRANSESOPHAGEAL ECHOCARDIOGRAM (TEE) (N/A) RADIAL ARTERY HARVEST (Left) ENDOVEIN HARVEST OF GREATER SAPHENOUS VEIN (Right) Subjective: Feels pretty well, nausea is intermittent and mild currently  Objective: Vital signs in last 24 hours: Temp:  [97.7 F (36.5 C)-98.6 F (37 C)] 98 F (36.7 C) (02/07 0731) Pulse Rate:  [87-107] 87 (02/07 0731) Cardiac Rhythm: Normal sinus rhythm (02/07 0503) Resp:  [16-34] 18 (02/07 0731) BP: (90-112)/(56-65) 96/59 (02/07 0731) SpO2:  [89 %-98 %] 92 % (02/07 0731) Weight:  [162 lb 11.2 oz (73.8 kg)] 162 lb 11.2 oz (73.8 kg) (02/07 0442)  Hemodynamic parameters for last 24 hours:    Intake/Output from previous day: 02/06 0701 - 02/07 0700 In: 360 [P.O.:360] Out: -  Intake/Output this shift: No intake/output data recorded.  General appearance: alert, cooperative and no distress Heart: regular rate and rhythm Lungs: dim in lower fields Abdomen: benign, no tenderness or distension Extremities: min edema Wound: incis healing well, left arm N/V intact  Lab Results: Recent Labs    04/01/17 0423 04/02/17 0316  WBC 9.2 12.4*  HGB 9.3* 9.3*  HCT 28.1* 28.3*  PLT 284 352   BMET:  Recent Labs    04/01/17 0840 04/02/17 0316  NA 129* 127*  K 3.3* 3.6  CL 82* 81*  CO2 29 31  GLUCOSE 117* 108*  BUN 8 8  CREATININE 0.72 0.74  CALCIUM 8.9 8.6*    PT/INR:  Recent Labs    03/31/17 0406  LABPROT 15.7*  INR 1.26   ABG    Component Value Date/Time   PHART 7.362 03/25/2017 0423   HCO3 21.7 03/25/2017 0423   TCO2 28 03/27/2017 1751   ACIDBASEDEF 3.0 (H) 03/25/2017 0423   O2SAT 95.0 03/25/2017 0423   CBG (last 3)  Recent Labs   03/31/17 1156 03/31/17 1558 03/31/17 1927  GLUCAP 115* 121* 106*    Meds Scheduled Meds: . aspirin EC  325 mg Oral Daily   Or  . aspirin  324 mg Per Tube Daily  . bisacodyl  10 mg Rectal Daily  . cycloSPORINE  1 drop Both Eyes Daily  . docusate sodium  200 mg Oral Daily  . fentaNYL  50 mcg Transdermal Q72H  . furosemide  40 mg Oral Daily  . isosorbide mononitrate  15 mg Oral Daily  . lactulose  30 g Oral Daily  . lidocaine  1 patch Transdermal Q24H  . Melatonin  6 mg Oral QHS  . metoprolol tartrate  12.5 mg Oral BID  . mupirocin ointment   Nasal BID  . pantoprazole  40 mg Oral Daily  . potassium chloride  20 mEq Oral BID  . sodium chloride flush  10-40 mL Intracatheter Q12H  . ursodiol  900 mg Oral Q breakfast   And  . ursodiol  600 mg Oral Q supper   Continuous Infusions: . sodium chloride    . lactated ringers 10 mL/hr at 03/28/17 0600  . potassium chloride Stopped (03/27/17 1946)   PRN Meds:.ALPRAZolam, metoprolol tartrate, ondansetron (ZOFRAN) IV, potassium chloride, sodium chloride flush  Xrays US Abdomen Limited Ruq  Result Date: 03/31/2017 CLINICAL DATA:  Abnormal  LFTs. History of primary biliary cirrhosis. EXAM: ULTRASOUND ABDOMEN LIMITED RIGHT UPPER QUADRANT COMPARISON:  Right upper quadrant ultrasound dated November 19, 2015. FINDINGS: Gallbladder: No gallstones or wall thickening visualized. No sonographic Murphy sign noted by sonographer. Common bile duct: Diameter: 2 mm, normal. Liver: No focal lesion identified. Coarsened echotexture with normal echogenicity. Portal vein is patent on color Doppler imaging with normal direction of blood flow towards the liver. Incidental note is made of a small right pleural effusion. IMPRESSION: 1. Coarsened liver echotexture, suggestive of underlying hepatocellular disease. No focal hepatic abnormality. Electronically Signed   By: Titus Dubin M.D.   On: 03/31/2017 08:23    Assessment/Plan: S/P Procedure(s)  (LRB): CORONARY ARTERY BYPASS GRAFTING (CABG) x two, using left internal mammary artery, left radial artery, and right leg greater saphenous vein harvested endoscopically (N/A) TRANSESOPHAGEAL ECHOCARDIOGRAM (TEE) (N/A) RADIAL ARTERY HARVEST (Left) ENDOVEIN HARVEST OF GREATER SAPHENOUS VEIN (Right)  1 conts to improve, hemodyn stable in sinus, BP too low for ACE-I or ARB 2 GI has outlined plan for PBC- will need 2 week f/u with LFT's 3 no statins for now 4 gentle diuresis 5 routine rehab/pulm toilet 6 mildly increased leukocytosis, no fevers- monitor 7 H/H is stable 8 renal fxn is stable with good UO 9 poss home 1-2 days, d/c epw's  LOS: 10 days    John Giovanni 04/02/2017  CXR improved- possible postop pneumonia resolved Echo shows normal LV fx Home Fri 2-8 patient examined and medical record reviewed,agree with above note. Tharon Aquas Trigt III 04/02/2017

## 2017-04-02 NOTE — Progress Notes (Signed)
CARDIAC REHAB PHASE I   PRE:  Rate/Rhythm: 94 SR  BP:  Supine:   Sitting: 104/68  Standing:    SaO2: 95%RA  MODE:  Ambulation: 300 ft   POST:  Rate/Rhythm: 109 ST  BP:  Supine:   Sitting: 108/70  Standing:    SaO2: 92%RA 1011-1105 Pt walked 300 ft on RA with hand held asst . Became nauseated so we cut walk short. She had just taken her lactulose. Requested nausea med from RN. Education completed with pt and sister who voiced understanding. Reviewed sternal precautions, staying in the tube, ex ed, heart healthy food choices and IS. Discussed CRP 2 and will refer to [place]. Does not need walker for home.   Graylon Good, RN BSN  04/02/2017 11:01 AM

## 2017-04-03 MED ORDER — ALPRAZOLAM 0.25 MG PO TABS: 0.2500 mg | ORAL_TABLET | Freq: Two times a day (BID) | ORAL | 0 refills | Status: DC | PRN

## 2017-04-03 MED ORDER — POTASSIUM CHLORIDE CRYS ER 20 MEQ PO TBCR: 20.0000 meq | EXTENDED_RELEASE_TABLET | Freq: Two times a day (BID) | ORAL | 0 refills | Status: DC

## 2017-04-03 MED ORDER — ISOSORBIDE MONONITRATE ER 30 MG PO TB24: 15.0000 mg | ORAL_TABLET | Freq: Every day | ORAL | 1 refills | Status: DC

## 2017-04-03 MED ORDER — LACTULOSE 10 GM/15ML PO SOLN: 30.0000 g | Freq: Every day | ORAL | 0 refills | Status: DC

## 2017-04-03 MED ORDER — ASPIRIN 325 MG PO TBEC: 325.0000 mg | DELAYED_RELEASE_TABLET | Freq: Every day | ORAL | 0 refills | Status: DC

## 2017-04-03 MED ORDER — FENTANYL 25 MCG/HR TD PT72
50.0000 ug | MEDICATED_PATCH | TRANSDERMAL | Status: DC
  Administered 2017-04-03: 50 ug via TRANSDERMAL
  Filled 2017-04-03: qty 2

## 2017-04-03 MED ORDER — FUROSEMIDE 40 MG PO TABS: 40.0000 mg | ORAL_TABLET | Freq: Every day | ORAL | 0 refills | Status: DC

## 2017-04-03 MED ORDER — METOPROLOL TARTRATE 25 MG PO TABS: 12.5000 mg | ORAL_TABLET | Freq: Two times a day (BID) | ORAL | 1 refills | Status: DC

## 2017-04-03 MED ORDER — TRAMADOL HCL 50 MG PO TABS: 50.0000 mg | ORAL_TABLET | Freq: Four times a day (QID) | ORAL | 0 refills | Status: DC | PRN

## 2017-04-03 MED ORDER — ONDANSETRON HCL 4 MG PO TABS: 4.0000 mg | ORAL_TABLET | Freq: Three times a day (TID) | ORAL | 0 refills | Status: DC | PRN

## 2017-04-03 MED FILL — ISOSORBIDE MN ER 30 MG TAB: 30 | 30 days supply | Qty: 30 | Fill #0

## 2017-04-03 MED FILL — LACTULOSE 10 GM/15 ML SOLN: 10 | 5 days supply | Qty: 240 | Fill #0

## 2017-04-03 MED FILL — traMADol HCL 50 MG TABS: 50 | 7 days supply | Qty: 30 | Fill #0

## 2017-04-03 MED FILL — FUROSEMIDE 40 MG TAB: 40 | 3 days supply | Qty: 3 | Fill #0

## 2017-04-03 MED FILL — ALPRAZolam 0.25 MG TABS: 0.25 | 15 days supply | Qty: 30 | Fill #0

## 2017-04-03 MED FILL — ASPIRIN EC 325 MG TABLET: 325 | 30 days supply | Qty: 30 | Fill #0

## 2017-04-03 MED FILL — METOPROLOL TARTRATE 25 MG T: 25 | 60 days supply | Qty: 60 | Fill #0

## 2017-04-03 MED FILL — ONDANSETRON HCL 4 MG TABLET: 4 | 6 days supply | Qty: 20 | Fill #0

## 2017-04-03 MED FILL — POTASSIUM CL ER 20 MEQ TABL: 20 | 3 days supply | Qty: 6 | Fill #0

## 2017-04-03 NOTE — Progress Notes (Signed)
Pt d/c home per MD order, pt VSS, pt verbalized understanding of d/c, all questions answered, pt family at [facility]

## 2017-04-03 NOTE — Progress Notes (Addendum)
[address]       [place] [address]             [phone]      10 Days Post-Op Procedure(s) (LRB): CORONARY ARTERY BYPASS GRAFTING (CABG) x two, using left internal mammary artery, left radial artery, and right leg greater saphenous vein harvested endoscopically (N/A) TRANSESOPHAGEAL ECHOCARDIOGRAM (TEE) (N/A) RADIAL ARTERY HARVEST (Left) ENDOVEIN HARVEST OF GREATER SAPHENOUS VEIN (Right) Subjective: Having some left sided chest squeezing. She otherwise feels okay. Nausea is resolving.   Objective: Vital signs in last 24 hours: Temp:  [97.8 F (36.6 C)-98.7 F (37.1 C)] 97.8 F (36.6 C) (02/08 0525) Pulse Rate:  [83-94] 84 (02/08 0525) Cardiac Rhythm: Normal sinus rhythm (02/07 1900) Resp:  [16-20] 16 (02/08 0525) BP: (92-104)/(52-80) 96/59 (02/08 0525) SpO2:  [91 %-95 %] 95 % (02/08 0525) Weight:  [161 lb 1.6 oz (73.1 kg)] 161 lb 1.6 oz (73.1 kg) (02/08 0525)     Intake/Output from previous day: 02/07 0701 - 02/08 0700 In: 1317.5 [P.O.:480; I.V.:837.5] Out: -  Intake/Output this shift: No intake/output data recorded.  General appearance: alert, cooperative and no distress Heart: regular rate and rhythm, S1, S2 normal, no murmur, click, rub or gallop Lungs: clear to auscultation bilaterally Abdomen: soft, non-tender; bowel sounds normal; no masses,  no organomegaly Extremities: extremities normal, atraumatic, no cyanosis or edema Wound: clean and dry  Lab Results: Recent Labs    04/01/17 0423 04/02/17 0316  WBC 9.2 12.4*  HGB 9.3* 9.3*  HCT 28.1* 28.3*  PLT 284 352   BMET:  Recent Labs    04/01/17 0840 04/02/17 0316  NA 129* 127*  K 3.3* 3.6  CL 82* 81*  CO2 29 31  GLUCOSE 117* 108*  BUN 8 8  CREATININE 0.72 0.74  CALCIUM 8.9 8.6*    PT/INR: No results for input(s): LABPROT, INR in the last 72 hours. ABG    Component Value Date/Time   PHART 7.362 03/25/2017 0423   HCO3 21.7 03/25/2017 0423   TCO2 28 03/27/2017 1751     ACIDBASEDEF 3.0 (H) 03/25/2017 0423   O2SAT 95.0 03/25/2017 0423   CBG (last 3)  Recent Labs    03/31/17 1156 03/31/17 1558 03/31/17 1927  GLUCAP 115* 121* 106*    Assessment/Plan: S/P Procedure(s) (LRB): CORONARY ARTERY BYPASS GRAFTING (CABG) x two, using left internal mammary artery, left radial artery, and right leg greater saphenous vein harvested endoscopically (N/A) TRANSESOPHAGEAL ECHOCARDIOGRAM (TEE) (N/A) RADIAL ARTERY HARVEST (Left) ENDOVEIN HARVEST OF GREATER SAPHENOUS VEIN (Right)  1. CV-NSR in the 80s, BP soft.  On Lopressor 12.5mg  BID, No ACEI or statin. Asa, continue Imdur for radial harvest site. EPW out 2. GI following and trending LFTs. Avoid statins. On Lactulose. 3. Renal-Lasix 40mg  daily, weight is stable. She is down to his baseline. creatinine 0.74 4. Pulm-CXR yesterday showed decreased bilateral lung opacities suggestive of improving edema and basilar atelectasis, small bilateral pleural effusions.  5. Blood glucose level has been well controlled.  6. Mild leukocytosis. No fevers. 7. H and H stable and platelets trending up 8. Insomnia- suggested melatonin or benadryl  9. Pain management-requesting a fentanyl path and ultram? Okay to give both?  Plan: home today.    One more fentanyl patch today for transition home Every other staple removed from arm Ready for DC home- instructions reviewed with patient  patient examined and medical record reviewed,agree with above note. Christine Butler 04/03/2017  LOS: 11 days    Elgie Collard 04/03/2017

## 2017-04-03 NOTE — Progress Notes (Signed)
Patient to follow up with me in office in 7-10 days after discharge, will make necessary arrangements.  [name], MD [facility]

## 2017-04-03 NOTE — Care Management Note (Signed)
Case Management Note Marvetta Gibbons RN, BSN Unit 4E-Case Manager-- [place] coverage [phone]  Patient Details  Name: Christine Butler MRN: [id number] Date of Birth: 08-Apr-1970  Subjective/Objective:  Pt admitted with unstable angina who was found to have severe LAD/Diagonal stenosis by cath 03/23/17. She is s/p 2 V CABG on 03/24/17                  Action/Plan: PTA Pt lived at home, independent- anticipate return home- CM to follow for transition of care needs.   Expected Discharge Date:  04/03/17               Expected Discharge Plan:  Home/Self Care  In-House Referral:  NA  Discharge planning Services  CM Consult  Post Acute Care Choice:  NA Choice offered to:  NA  DME Arranged:    DME Agency:     HH Arranged:    HH Agency:     Status of Service:  Completed, signed off  If discussed at [employer] of Stay Meetings, dates discussed:    Discharge Disposition: home/self care   Additional Comments:   04/03/17- [place] RN, CM - pt for d/c home today, no CM needs noted for transition home.   Zenon Mayo, RN 04/01/2017, 1:45 PM---From home alone, pta indep, POD 8 CABG, radical artery harvest,  Biliary cholangitis- ( GI following) cont to diuresis, plan for home Thursday or Friday.  She states her sister Colletta [place] will be with her at home after discharge 24/7 for assistance. , also she sees [employer], she has medication coverage.               Dawayne Patricia, RN 04/03/2017, 11:19 AM

## 2017-04-06 ENCOUNTER — Other Ambulatory Visit: Payer: Self-pay | Admitting: *Deleted

## 2017-04-06 ENCOUNTER — Telehealth (HOSPITAL_COMMUNITY): Payer: Self-pay

## 2017-04-06 NOTE — Patient Outreach (Signed)
[facility]) Care Management  04/06/2017  KALIKA SMAY 1970-03-19 [id number]  Subjective: Telephone call to patient's home / mobile number, spoke with patient, and HIPAA verified.  Discussed [facility] Care Management [facility] Transition of care follow up, patient voiced understanding, and is in agreement to follow up.  Patient states she is doing better, chest is very sore from the surgery, pain varies, pain being managed with pain medications, has a follow up appointment with surgeon's office for suture removal on 04/10/17, and follow up visit with surgeon on 04/29/17.   States her sister Timmie Foerster [phone]) is her emergency contact, caregiver, and will be staying with her for a couple of weeks. Patient states she is able to manage self care and has assistance as needed with activities of daily living / home management. Patient voices understanding of medical diagnosis, surgery,  and treatment plan.  States she is accessing the following [facility] benefits: outpatient pharmacy, hospital indemnity (not chosen), and has started family medical leave act [employer]) process.  States she is aware that she will need to follow up on FMLA paperwork completion and obtain copies of paperwork for her records.  Patient states she does not have any education material, transition of care, care coordination, disease management, disease monitoring, transportation, community resource, or pharmacy needs at this time.  States she is very appreciative of the follow up and is in agreement to receive [facility] Management information.     Objective: Per KPN (Knowledge Performance Now, point of care tool) and chart review, patient hospitalized 03/23/17 -04/03/17 for Coronary artery disease involving native coronary artery of native heart with unstable angina pectoris.     Status post  Coronary artery bypass grafting x2 (left internal mammary artery to dominant diagonal, left radial artery free graft to left  anterior descending with interposition vein graft at the proximal anastomosis), Harvest of left radial artery as a free graft, and Right greater saphenous vein endoscopic harvest.   Patient also has a history of Primary biliary cholangitis and IBS (irritable bowel syndrome).        Assessment: Received [facility] Transition of care referral on 03/24/17.   Transition of care follow up completed, no care management needs, and will proceed with case closure.      Plan: RNCM will send patient successful outreach letter, [facility] pamphlet, and magnet. RNCM will send case closure due to follow up completed / no care management needs request to [facility]. RNCM will send emergency contact update request to [facility].   Per patient's request (Update patient's emergency contact from York Pellant (patient's ex-husband)  to sister Timmie Foerster [phone]).        Edilberto Roosevelt H. Annia Friendly, BSN, [facility] Telephonic CM Phone: [phone] Fax: [phone]

## 2017-04-06 NOTE — Telephone Encounter (Signed)
Patients insurance is active and benefits verified through [facility] - No co-pay, deductible amount of $300.00/$300.00 has been met, out of pocket amount of $7,900/$2,269.93 has been met, 20% co-insurance, and no pre-authorization is required. Spoke with [facility] - reference [id number]  Patient will be scheduled after their follow up appt with the Cardiologist office upon review by the RN Navigator.

## 2017-04-10 ENCOUNTER — Other Ambulatory Visit: Payer: Self-pay

## 2017-04-10 ENCOUNTER — Ambulatory Visit
Admission: RE | Admit: 2017-04-10 | Discharge: 2017-04-10 | Disposition: A | Discharge status: Home / Self Care | Payer: 59 | Source: Ambulatory Visit | Attending: Cardiothoracic Surgery | Admitting: Cardiothoracic Surgery

## 2017-04-10 ENCOUNTER — Other Ambulatory Visit: Payer: Self-pay | Admitting: Cardiothoracic Surgery

## 2017-04-10 ENCOUNTER — Ambulatory Visit (INDEPENDENT_AMBULATORY_CARE_PROVIDER_SITE_OTHER): Payer: Self-pay

## 2017-04-10 DIAGNOSIS — I25119 Atherosclerotic heart disease of native coronary artery with unspecified angina pectoris: Secondary | ICD-10-CM

## 2017-04-10 DIAGNOSIS — Z4802 Encounter for removal of sutures: Secondary | ICD-10-CM

## 2017-04-10 DIAGNOSIS — R071 Chest pain on breathing: Secondary | ICD-10-CM

## 2017-04-10 DIAGNOSIS — J811 Chronic pulmonary edema: Secondary | ICD-10-CM | POA: Diagnosis not present

## 2017-04-10 MED ORDER — POTASSIUM CHLORIDE CRYS ER 20 MEQ PO TBCR: 20.0000 meq | EXTENDED_RELEASE_TABLET | Freq: Every day | ORAL | 0 refills | Status: DC

## 2017-04-10 MED ORDER — FUROSEMIDE 40 MG PO TABS: 40.0000 mg | ORAL_TABLET | Freq: Every day | ORAL | 0 refills | Status: DC

## 2017-04-10 MED FILL — FUROSEMIDE 40 MG TAB: 40 | 7 days supply | Qty: 7 | Fill #0

## 2017-04-10 MED FILL — POTASSIUM CL ER 20 MEQ TABL: 20 | 7 days supply | Qty: 7 | Fill #0

## 2017-04-10 NOTE — Progress Notes (Addendum)
Removed 19 staples from left forearm at left radial artery harvest site, no signs of infection. Removed 2 sutures from chest tube sites, no signs of infection and patient tolerated well. She was c/o sharp pain with deep breaths after having a massage yesterday at [employer] due to back and neck pain. I sent her for a CXR and will have Dr Prescott Gum review and call her back with plan.     CXR reviewed. Sternal wires are intact. There is no pneumothorax. There are some small bilateral pleural effusions and atelectasis. Report is listed below. Information shared with Dr. Prescott Gum. He would like an additional week of Lasix ordered and for the patient to return Monday with a follow-up CXR.    CLINICAL DATA:  New onset left upper posterior chest pains. CABG 03/24/2017.  EXAM: CHEST  2 VIEW  COMPARISON:  Two-view chest x-ray 04/02/2017.  FINDINGS: Patient is status post median sternotomy.  CABG is noted.  Heart size is enlarged. Interstitial edema and bilateral effusions have increased since the prior exam. Bibasilar airspace disease likely reflects atelectasis. The visualized soft tissues and bony thorax are otherwise unremarkable.  IMPRESSION: 1. Cardiomegaly with increasing interstitial edema and effusions compatible with congestive heart failure following CABG. 2. Bibasilar airspace disease likely reflects atelectasis.   Electronically Signed   By: San Morelle M.D.   On: 04/10/2017 11:33   Plan: Ordered: Lasix 40mg  daily x 7 days AND K-dur 20MEQ daily x 7 days. Appointment on Monday with follow-up CXR.    Nicholes Rough, PA-C

## 2017-04-10 NOTE — Addendum Note (Signed)
Addended by: Elgie Collard on: 04/10/2017 12:19 PM   Modules accepted: Orders

## 2017-04-13 ENCOUNTER — Other Ambulatory Visit: Payer: Self-pay

## 2017-04-13 ENCOUNTER — Encounter: Payer: Self-pay | Admitting: Cardiothoracic Surgery

## 2017-04-13 ENCOUNTER — Ambulatory Visit (INDEPENDENT_AMBULATORY_CARE_PROVIDER_SITE_OTHER): Payer: Self-pay | Admitting: Cardiothoracic Surgery

## 2017-04-13 ENCOUNTER — Ambulatory Visit
Admission: RE | Admit: 2017-04-13 | Discharge: 2017-04-13 | Disposition: A | Discharge status: Home / Self Care | Payer: 59 | Source: Ambulatory Visit | Attending: Cardiothoracic Surgery | Admitting: Cardiothoracic Surgery

## 2017-04-13 VITALS — BP 106/69 | HR 89 | Resp 18 | Ht 62.0 in | Wt 155.2 lb

## 2017-04-13 DIAGNOSIS — Z951 Presence of aortocoronary bypass graft: Secondary | ICD-10-CM

## 2017-04-13 DIAGNOSIS — Z736 Limitation of activities due to disability: Secondary | ICD-10-CM

## 2017-04-13 DIAGNOSIS — I25119 Atherosclerotic heart disease of native coronary artery with unspecified angina pectoris: Secondary | ICD-10-CM

## 2017-04-13 DIAGNOSIS — J90 Pleural effusion, not elsewhere classified: Secondary | ICD-10-CM | POA: Diagnosis not present

## 2017-04-13 MED ORDER — HYDROMORPHONE HCL 2 MG PO TABS: 2.0000 mg | ORAL_TABLET | Freq: Four times a day (QID) | ORAL | 0 refills | Status: DC | PRN

## 2017-04-13 MED FILL — HYDROmorphone HCL 2 MG TABS: 2 | 5 days supply | Qty: 20 | Fill #0

## 2017-04-14 ENCOUNTER — Encounter: Payer: Self-pay | Admitting: Cardiothoracic Surgery

## 2017-04-14 NOTE — Progress Notes (Signed)
PCP is Patient, No Pcp Per Referring Provider is Jettie Booze, MD  Chief Complaint  Patient presents with  . Coronary Artery Disease    s/p CABG with chest xray    HPI: 47 year old female returns for postop check after urgent CABG. Patient has biliary cirrhosis and her bilirubin level increased from baseline 5.2 up to 14 postop. She was seen by GI medicine and cleared for discharge to home. She is feeling better with respect to her GI disease however she has had significant muscular skeletal pain in her shoulders and neck after sternotomy. She was seen in the office 3 days ago and was recommended Ultram. This is not worked. The patient also had a chest x-ray at the office visit which showed bilateral pleural effusions and the patient was placed on Lasix. She returns now for further out patient management of her postoperative problems with close monitoring to avoid readmission. She has an appointment with her GI medicine doctor later this week.  Surgical incisions are healing. She only has mild edema. She denies shortness of breath. She has had no recurrent angina.  Past Medical History:  Diagnosis Date  . Arthritis    right knee  . GERD (gastroesophageal reflux disease)   . IBS (irritable bowel syndrome)   . PONV (postoperative nausea and vomiting)   . Primary biliary cholangitis ([name])   . Primary biliary cirrhosis ([place])   . SVD (spontaneous vaginal delivery)    x 3  . Urinary tract bacterial infections     Past Surgical History:  Procedure Laterality Date  . CORONARY ARTERY BYPASS GRAFT N/A 03/24/2017   Procedure: CORONARY ARTERY BYPASS GRAFTING (CABG) x two, using left internal mammary artery, left radial artery, and right leg greater saphenous vein harvested endoscopically;  Surgeon: Ivin Poot, MD;  Location: [facility];  Service: Open Heart Surgery;  Laterality: N/A;  . ENDOVEIN HARVEST OF GREATER SAPHENOUS VEIN Right 03/24/2017   Procedure: ENDOVEIN HARVEST OF GREATER  SAPHENOUS VEIN;  Surgeon: Ivin Poot, MD;  Location: [facility];  Service: Open Heart Surgery;  Laterality: Right;  . INCONTINENCE SURGERY    . LEEP N/A 03/28/2014   Procedure: LOOP ELECTROSURGICAL EXCISION PROCEDURE (LEEP) cone biopsy;  Surgeon: Cyril Mourning, MD;  Location: [facility];  Service: Gynecology;  Laterality: N/A;  . LEFT HEART CATH AND CORONARY ANGIOGRAPHY N/A 03/23/2017   Procedure: LEFT HEART CATH AND CORONARY ANGIOGRAPHY;  Surgeon: Jettie Booze, MD;  Location: [facility];  Service: Cardiovascular;  Laterality: N/A;  . LIVER BIOPSY     x 2  . RADIAL ARTERY HARVEST Left 03/24/2017   Procedure: RADIAL ARTERY HARVEST;  Surgeon: Ivin Poot, MD;  Location: [facility];  Service: Open Heart Surgery;  Laterality: Left;  . TEE WITHOUT CARDIOVERSION N/A 03/24/2017   Procedure: TRANSESOPHAGEAL ECHOCARDIOGRAM (TEE);  Surgeon: Prescott Gum, Collier Salina, MD;  Location: [facility];  Service: Open Heart Surgery;  Laterality: N/A;  . WISDOM TOOTH EXTRACTION      Family History  Problem Relation Age of Onset  . CAD Father   . Diabetes Mellitus II Father     Social History Social History   Tobacco Use  . Smoking status: Never Smoker  . Smokeless tobacco: Never Used  Substance Use Topics  . Alcohol use: Yes    Alcohol/week: 0.6 oz    Types: 1 Glasses of wine per week    Comment: for dinner  . Drug use: No    Current Outpatient Medications  Medication Sig Dispense Refill  . ALPRAZolam (XANAX) 0.25 MG tablet Take 1 tablet (0.25 mg total) by mouth 2 (two) times daily as needed for anxiety or sleep. 30 tablet 0  . aspirin 325 MG EC tablet Take 1 tablet (325 mg total) by mouth daily. 30 tablet 0  . Calcium Carbonate-Vitamin D (CALCIUM + D PO) Take 1 tablet by mouth 2 (two) times a week.     . cholecalciferol (VITAMIN D) 1000 units tablet Take 2,000 Units by mouth 2 (two) times a week.     . cycloSPORINE (RESTASIS) 0.05 % ophthalmic emulsion Place 1 drop into both eyes daily.    .  diphenhydrAMINE (BENADRYL) 25 mg capsule Take 50 mg by mouth at bedtime as needed for itching.    . furosemide (LASIX) 40 MG tablet Take 1 tablet (40 mg total) by mouth daily for 7 days. 7 tablet 0  . isosorbide mononitrate (IMDUR) 30 MG 24 hr tablet Take 0.5 tablets (15 mg total) by mouth daily. 30 tablet 1  . lactulose (CHRONULAC) 10 GM/15ML solution Take 45 mLs (30 g total) by mouth daily. 240 mL 0  . Melatonin 3 MG TABS Take 6 mg by mouth at bedtime.    . metoprolol tartrate (LOPRESSOR) 25 MG tablet Take 0.5 tablets (12.5 mg total) by mouth 2 (two) times daily. 60 tablet 1  . Multiple Vitamin (MULTIVITAMIN WITH MINERALS) TABS Take 1 tablet by mouth 2 (two) times a week.     Marland Kitchen omeprazole (PRILOSEC) 10 MG capsule Take 20 mg by mouth daily.    . ondansetron (ZOFRAN) 4 MG tablet Take 1 tablet (4 mg total) by mouth every 8 (eight) hours as needed for nausea or vomiting. 20 tablet 0  . potassium chloride SA (K-DUR,KLOR-CON) 20 MEQ tablet Take 1 tablet (20 mEq total) by mouth daily for 7 days. 7 tablet 0  . Probiotic Product (PROBIOTIC PO) Take 1 tablet by mouth daily.    . traMADol (ULTRAM) 50 MG tablet Take 1 tablet (50 mg total) by mouth every 6 (six) hours as needed. 30 tablet 0  . ursodiol (ACTIGALL) 300 MG capsule Take 600-900 mg by mouth 2 (two) times daily. 900 mg in am,  600 mg in pm    . HYDROmorphone (DILAUDID) 2 MG tablet Take 1 tablet (2 mg total) by mouth every 6 (six) hours as needed for severe pain. 20 tablet 0   No current facility-administered medications for this visit.     Allergies  Allergen Reactions  . Codeine Nausea And Vomiting  . Erythromycin Nausea And Vomiting  . Fentanyl Nausea And Vomiting    Review of Systems  No fever or night sweats Appetite somewhat improved  BP 106/69 (BP Location: Right Arm, Patient Position: Sitting, Cuff Size: Normal)   Pulse 89   Resp 18   Ht 5\' 2"  (1.575 m)   Wt 155 lb 3.2 oz (70.4 kg)   SpO2 93% Comment: RA  BMI 28.39 kg/m   Physical Exam Alert and comfortable Lungs clear Sternal incision clean and dry Left radial artery harvest site clean and dry Left hand neurologic intact Heart rhythm regular without murmur  Diagnostic Tests: Chest x-ray done today is reviewed shows improvement in bilateral small pleural effusions.  Impression: Patient is progressing. She'll complete a seven-day course of Lasix. She was provided a short prescription of oral hydromorphone for pain. She is encouraged to increase her walking. She has an appointment to return in 2 weeks to this office. She  knows not to start driving until she is seen in 2 weeks.  Plan: Return in 2 weeks for review of progress. She appears to be improved now on oral Lasix with respective small bilateral pleural effusions. She is reassured that her pain from musculoskeletal source will improved with time. Len Childs, MD [facility] [phone]

## 2017-04-16 DIAGNOSIS — K743 Primary biliary cirrhosis: Secondary | ICD-10-CM | POA: Diagnosis not present

## 2017-04-17 MED FILL — LACTULOSE 10 GM/15 ML SOLN: 10 | 30 days supply | Qty: 946 | Fill #0

## 2017-04-20 ENCOUNTER — Encounter: Payer: Self-pay | Admitting: Cardiology

## 2017-04-20 ENCOUNTER — Ambulatory Visit (INDEPENDENT_AMBULATORY_CARE_PROVIDER_SITE_OTHER): Payer: 59 | Admitting: Cardiology

## 2017-04-20 VITALS — BP 132/70 | HR 94 | Ht 62.0 in | Wt 152.4 lb

## 2017-04-20 DIAGNOSIS — R7989 Other specified abnormal findings of blood chemistry: Secondary | ICD-10-CM

## 2017-04-20 DIAGNOSIS — D62 Acute posthemorrhagic anemia: Secondary | ICD-10-CM | POA: Diagnosis not present

## 2017-04-20 DIAGNOSIS — R945 Abnormal results of liver function studies: Secondary | ICD-10-CM

## 2017-04-20 DIAGNOSIS — Z951 Presence of aortocoronary bypass graft: Secondary | ICD-10-CM | POA: Diagnosis not present

## 2017-04-20 DIAGNOSIS — I2510 Atherosclerotic heart disease of native coronary artery without angina pectoris: Secondary | ICD-10-CM | POA: Diagnosis not present

## 2017-04-20 HISTORY — DX: Other specified abnormal findings of blood chemistry: R79.89

## 2017-04-20 HISTORY — DX: Acute posthemorrhagic anemia: D62

## 2017-04-20 NOTE — Patient Instructions (Signed)
Medication Instructions:  Your physician recommends that you continue on your current medications as directed. Please refer to the Current Medication list given to you today.  Labwork: None  Testing/Procedures: None  Follow-Up: Your physician recommends that you schedule a follow-up appointment in: 1 month  Any Other Special Instructions Will Be Listed Below (If Applicable).     If you need a refill on your cardiac medications before your next appointment, please call your pharmacy.   [facility]  Vikrant Pryce A, RN, BSN  

## 2017-04-20 NOTE — Progress Notes (Signed)
Cardiology Office Note:    Date:  04/20/2017   ID:  Christine Butler, DOB [birth year], MRN [id number]  PCP:  Patient, No Pcp Per  Cardiologist:  [name], MD   Referring MD: No ref. provider found    ASSESSMENT:    1. Coronary artery disease involving native coronary artery of native heart without angina pectoris   2. S/P CABG x 2   3. Acute blood loss anemia   4. Elevated LFTs    PLAN:    In order of problems listed above:  1. Secondary prevention stressed with the patient.  Importance of compliance with diet and medications stressed and she vocalized understanding.  Coronary angiography report discussed with the patient.  Questions were answered to her satisfaction. 2. Her blood pressure stable and diet was discussed for dyslipidemia.  I will wait for her LFTs to be stabilized and in consultation with the gastroenterologist address her lipid lowering in a month when I see her next. 3. Pleural effusion issues are followed by a cardiac surgeon. 4. Patient had multiple questions which were answered to her satisfaction.  She will be seen in follow-up appointment in a month or earlier if she has any concerns.   Medication Adjustments/Labs and Tests Ordered: Current medicines are reviewed at length with the patient today.  Concerns regarding medicines are outlined above.  No orders of the defined types were placed in this encounter.  No orders of the defined types were placed in this encounter.    Chief Complaint  Patient presents with  . Follow-up  . Chest Pain     History of Present Illness:    Christine Butler is a 47 y.o. female.  Patient has been evaluated by me recently for chest pains testing of angina and I sent her for coronary angiography.  She had critical mid LAD disease which was at the bifurcation.  The other coronaries were unremarkable and the patient needed to CABG surgery.  It appears to have been encountered a few complications including bilateral pleural  effusion, elevated LFTs and elevated bilirubin.  Patient is under the care of the surgeon and gastroenterologist for this and is feeling better.  She denies any chest pain orthopnea or PND.  At the time of my evaluation, the patient is alert awake oriented and in no distress.  Past Medical History:  Diagnosis Date  . Arthritis    right knee  . GERD (gastroesophageal reflux disease)   . IBS (irritable bowel syndrome)   . PONV (postoperative nausea and vomiting)   . Primary biliary cholangitis ([place])   . Primary biliary cirrhosis ([place])   . SVD (spontaneous vaginal delivery)    x 3  . Urinary tract bacterial infections     Past Surgical History:  Procedure Laterality Date  . CORONARY ARTERY BYPASS GRAFT N/A 03/24/2017   Procedure: CORONARY ARTERY BYPASS GRAFTING (CABG) x two, using left internal mammary artery, left radial artery, and right leg greater saphenous vein harvested endoscopically;  Surgeon: Ivin Poot, MD;  Location: [facility];  Service: Open Heart Surgery;  Laterality: N/A;  . ENDOVEIN HARVEST OF GREATER SAPHENOUS VEIN Right 03/24/2017   Procedure: ENDOVEIN HARVEST OF GREATER SAPHENOUS VEIN;  Surgeon: Ivin Poot, MD;  Location: [facility];  Service: Open Heart Surgery;  Laterality: Right;  . INCONTINENCE SURGERY    . LEEP N/A 03/28/2014   Procedure: LOOP ELECTROSURGICAL EXCISION PROCEDURE (LEEP) cone biopsy;  Surgeon: Cyril Mourning, MD;  Location: [facility];  Service: Gynecology;  Laterality: N/A;  . LEFT HEART CATH AND CORONARY ANGIOGRAPHY N/A 03/23/2017   Procedure: LEFT HEART CATH AND CORONARY ANGIOGRAPHY;  Surgeon: Jettie Booze, MD;  Location: [facility];  Service: Cardiovascular;  Laterality: N/A;  . LIVER BIOPSY     x 2  . RADIAL ARTERY HARVEST Left 03/24/2017   Procedure: RADIAL ARTERY HARVEST;  Surgeon: Ivin Poot, MD;  Location: [facility];  Service: Open Heart Surgery;  Laterality: Left;  . TEE WITHOUT CARDIOVERSION N/A 03/24/2017   Procedure:  TRANSESOPHAGEAL ECHOCARDIOGRAM (TEE);  Surgeon: Prescott Gum, Collier Salina, MD;  Location: [facility];  Service: Open Heart Surgery;  Laterality: N/A;  . WISDOM TOOTH EXTRACTION      Current Medications: Current Meds  Medication Sig  . ALPRAZolam (XANAX) 0.25 MG tablet Take 1 tablet (0.25 mg total) by mouth 2 (two) times daily as needed for anxiety or sleep.  Marland Kitchen aspirin 325 MG EC tablet Take 1 tablet (325 mg total) by mouth daily.  . Calcium Carbonate-Vitamin D (CALCIUM + D PO) Take 1 tablet by mouth 2 (two) times a week.   . cholecalciferol (VITAMIN D) 1000 units tablet Take 2,000 Units by mouth 2 (two) times a week.   . cycloSPORINE (RESTASIS) 0.05 % ophthalmic emulsion Place 1 drop into both eyes daily.  . diphenhydrAMINE (BENADRYL) 25 mg capsule Take 50 mg by mouth at bedtime as needed for itching.  Marland Kitchen HYDROmorphone (DILAUDID) 2 MG tablet Take 1 tablet (2 mg total) by mouth every 6 (six) hours as needed for severe pain.  . isosorbide mononitrate (IMDUR) 30 MG 24 hr tablet Take 0.5 tablets (15 mg total) by mouth daily.  Marland Kitchen lactulose (CHRONULAC) 10 GM/15ML solution Take 45 mLs (30 g total) by mouth daily.  . Melatonin 3 MG TABS Take 6 mg by mouth at bedtime.  . metoprolol tartrate (LOPRESSOR) 25 MG tablet Take 0.5 tablets (12.5 mg total) by mouth 2 (two) times daily.  . Multiple Vitamin (MULTIVITAMIN WITH MINERALS) TABS Take 1 tablet by mouth 2 (two) times a week.   Marland Kitchen omeprazole (PRILOSEC) 10 MG capsule Take 20 mg by mouth daily.  . ondansetron (ZOFRAN) 4 MG tablet Take 1 tablet (4 mg total) by mouth every 8 (eight) hours as needed for nausea or vomiting.  . Probiotic Product (PROBIOTIC PO) Take 1 tablet by mouth daily.  . traMADol (ULTRAM) 50 MG tablet Take 1 tablet (50 mg total) by mouth every 6 (six) hours as needed.  . ursodiol (ACTIGALL) 300 MG capsule Take 600-900 mg by mouth 2 (two) times daily. 900 mg in am,  600 mg in pm     Allergies:   Codeine; Erythromycin; and Fentanyl   Social History    Socioeconomic History  . Marital status: Divorced    Spouse name: None  . Number of children: 3  . Years of education: 65  . Highest education level: Associate degree: academic program  Social Needs  . Financial resource strain: Not hard at all  . Food insecurity - worry: Never true  . Food insecurity - inability: Never true  . Transportation needs - medical: No  . Transportation needs - non-medical: No  Occupational History  . None  Tobacco Use  . Smoking status: Never Smoker  . Smokeless tobacco: Never Used  Substance and Sexual Activity  . Alcohol use: Yes    Alcohol/week: 0.6 oz    Types: 1 Glasses of wine per week    Comment: for dinner  .  Drug use: No  . Sexual activity: Yes    Partners: Male    Birth control/protection: None  Other Topics Concern  . None  Social History Narrative  . None     Family History: The patient's family history includes CAD in her father; Diabetes Mellitus II in her father.  ROS:   Please see the history of present illness.    All other systems reviewed and are negative.  EKGs/Labs/Other Studies Reviewed:    The following studies were reviewed today: I discussed my findings with her at extensive length and she vocalized understanding.  She has marketed hyperlipidemia and we will address this once her LFTs and other blood tests are stable.  I will not repeat them as they were recently done and followed by her other physicians mentioned above.   Recent Labs: 03/25/2017: Magnesium 2.0 04/02/2017: ALT 115; BUN 8; Creatinine, Ser 0.74; Hemoglobin 9.3; Platelets 352; Potassium 3.6; Sodium 127  Recent Lipid Panel    Component Value Date/Time   CHOL 406 (H) 03/23/2017 1456   TRIG 309 (H) 03/23/2017 1456   HDL 10 (L) 03/23/2017 1456   CHOLHDL 40.6 03/23/2017 1456   VLDL 62 (H) 03/23/2017 1456   LDLCALC 334 (H) 03/23/2017 1456    Physical Exam:    VS:  BP 132/70 (BP Location: Left Arm, Patient Position: Sitting, Cuff Size: Normal)    Pulse 94   Ht 5\' 2"  (1.575 m)   Wt 152 lb 6.4 oz (69.1 kg)   SpO2 95%   BMI 27.87 kg/m     Wt Readings from Last 3 Encounters:  04/20/17 152 lb 6.4 oz (69.1 kg)  04/13/17 155 lb 3.2 oz (70.4 kg)  04/03/17 161 lb 1.6 oz (73.1 kg)     GEN: Patient is in no acute distress HEENT: Normal NECK: No JVD; No carotid bruits LYMPHATICS: No lymphadenopathy CARDIAC: Hear sounds regular, 2/6 systolic murmur at the apex. RESPIRATORY:  Clear to auscultation without rales, wheezing or rhonchi  ABDOMEN: Soft, non-tender, non-distended MUSCULOSKELETAL:  No edema; No deformity  SKIN: Warm and dry NEUROLOGIC:  Alert and oriented x 3 PSYCHIATRIC:  Normal affect   Signed, [name], MD  04/20/2017 9:06 AM    [place]

## 2017-04-28 ENCOUNTER — Telehealth (HOSPITAL_COMMUNITY): Payer: Self-pay

## 2017-04-28 NOTE — Telephone Encounter (Signed)
Called to speak with patient in regards to Cardiac Rehab - patient stated she would like to get started with rehab before April as she is trying to go back to work. She does live near [place] and was okay to go to [facility] over referral. Closed referral.

## 2017-04-29 ENCOUNTER — Ambulatory Visit: Payer: 59 | Admitting: Cardiothoracic Surgery

## 2017-04-30 DIAGNOSIS — K743 Primary biliary cirrhosis: Secondary | ICD-10-CM | POA: Diagnosis not present

## 2017-05-04 ENCOUNTER — Other Ambulatory Visit: Payer: Self-pay | Admitting: Cardiothoracic Surgery

## 2017-05-04 ENCOUNTER — Ambulatory Visit (INDEPENDENT_AMBULATORY_CARE_PROVIDER_SITE_OTHER): Payer: Self-pay | Admitting: Physician Assistant

## 2017-05-04 ENCOUNTER — Other Ambulatory Visit: Payer: Self-pay

## 2017-05-04 ENCOUNTER — Ambulatory Visit
Admission: RE | Admit: 2017-05-04 | Discharge: 2017-05-04 | Disposition: A | Discharge status: Home / Self Care | Payer: 59 | Source: Ambulatory Visit | Attending: Cardiothoracic Surgery | Admitting: Cardiothoracic Surgery

## 2017-05-04 VITALS — BP 110/78 | HR 89 | Resp 18 | Ht 62.0 in | Wt 148.6 lb

## 2017-05-04 DIAGNOSIS — J90 Pleural effusion, not elsewhere classified: Secondary | ICD-10-CM | POA: Diagnosis not present

## 2017-05-04 DIAGNOSIS — I25119 Atherosclerotic heart disease of native coronary artery with unspecified angina pectoris: Secondary | ICD-10-CM

## 2017-05-04 DIAGNOSIS — Z951 Presence of aortocoronary bypass graft: Secondary | ICD-10-CM

## 2017-05-04 MED ORDER — ASPIRIN EC 81 MG PO TBEC: 81.0000 mg | DELAYED_RELEASE_TABLET | Freq: Every day | ORAL | Status: DC

## 2017-05-04 NOTE — Progress Notes (Signed)
HPI: Patient returns for 2 week follow per Dr. Prescott Gum.  She was evaluated on 04/13/2017.  At that time she was complaining of discomfort in her shoulders and neck that was felt to be musculoskeletal in nature.  She was given Ultram which did not provide relief.  Therefore Dr. Prescott Gum provided her Dilaudid for pain relief.  Also at that visit she was noted to have bilateral pleural effusions and was started on Lasix for this. Currently the patient states she is doing better. She continues to have some numbness in her left arm and left arm numbness.  She questions when this will resolve.  Her pain in her neck and shoulders has improved.  She is no longer taking pain medication other than Ibuprofen as recommended by her GI doctor.  She states she isn't eating much and has lost weight.  She states she continues to feel nauseated at times and is eating what she can.  She is ambulating as much as she can and she notices she is short of breath walking up inclines.  Her bilirubin level continues to improve and she is monitored closely by her GI physician.    Current Outpatient Medications  Medication Sig Dispense Refill  . ALPRAZolam (XANAX) 0.25 MG tablet Take 1 tablet (0.25 mg total) by mouth 2 (two) times daily as needed for anxiety or sleep. 30 tablet 0  . Calcium Carbonate-Vitamin D (CALCIUM + D PO) Take 1 tablet by mouth 2 (two) times a week.     . cholecalciferol (VITAMIN D) 1000 units tablet Take 2,000 Units by mouth 2 (two) times a week.     . cycloSPORINE (RESTASIS) 0.05 % ophthalmic emulsion Place 1 drop into both eyes daily.    . diphenhydrAMINE (BENADRYL) 25 mg capsule Take 50 mg by mouth at bedtime as needed for itching.    . isosorbide mononitrate (IMDUR) 30 MG 24 hr tablet Take 0.5 tablets (15 mg total) by mouth daily. 30 tablet 1  . lactulose (CHRONULAC) 10 GM/15ML solution Take 45 mLs (30 g total) by mouth daily. 240 mL 0  . Melatonin 3 MG TABS Take 6 mg by mouth at bedtime.    .  metoprolol tartrate (LOPRESSOR) 25 MG tablet Take 0.5 tablets (12.5 mg total) by mouth 2 (two) times daily. 60 tablet 1  . Multiple Vitamin (MULTIVITAMIN WITH MINERALS) TABS Take 1 tablet by mouth 2 (two) times a week.     Marland Kitchen omeprazole (PRILOSEC) 10 MG capsule Take 20 mg by mouth daily.    . ondansetron (ZOFRAN) 4 MG tablet Take 1 tablet (4 mg total) by mouth every 8 (eight) hours as needed for nausea or vomiting. 20 tablet 0  . Probiotic Product (PROBIOTIC PO) Take 1 tablet by mouth daily.    . ursodiol (ACTIGALL) 300 MG capsule Take 600-900 mg by mouth 2 (two) times daily. 900 mg in am,  600 mg in pm     No current facility-administered medications for this visit.     Physical Exam:  BP 110/78 (BP Location: Right Arm, Patient Position: Sitting, Cuff Size: Large)   Pulse 89   Resp 18   Ht 5\' 2"  (1.575 m)   Wt 148 lb 9.6 oz (67.4 kg)   SpO2 99% Comment: RA  BMI 27.18 kg/m   Gen: alert, NAD, appears Jaundiced Heart: RRR Lungs: CTA bilaterally Abd: soft non tender, non-distended Ext: no edema Incisions: well healed  Diagnostic Tests:  CXR: improvement of bilateral pleural effusions, minimal on  the right, no pneumothorax, sternal wires in place  A/P:  1. S/P CABG x 2- doing very well.. Patient not taking ASA as she states she was given a prescription for ASA and it had no refills so she stopped taking when it ran out. She was instructed to resume ASA daily at 81 mg 2. Primary Biliary Cholangitis- bilirubin is improving, she is closely monitored by her GI physician.. She does not have cirrhosis as previously stated in other notes 3. Musculoskeletal pain- instructed she can take Ibuprofen as instructed by GI physician however, she was instructed this can increase BP and is not the ideal pain medication for heart patients and to use only when needed 4. Parasthesias- Left arm and chest- expected complication of radial artery harvest and IMA harvest... Should improve with time if doesn't  resolve can send for conduction studies 5. Poor oral intake- encouraged patient to continue to eat as tolerated, and supplement with protein shakes/bars as needed 6. Activity- may resume driving, increase activity as tolerated, continue to restrict weight to 10-15lbs, she may return to work May 4 on light duty, slip provided 7. Dispo- RTC in July for 6 month post operative follow up  Ellwood Handler, PA-C [facility] [phone]

## 2017-05-05 DIAGNOSIS — Z951 Presence of aortocoronary bypass graft: Secondary | ICD-10-CM | POA: Diagnosis not present

## 2017-05-05 DIAGNOSIS — Z48812 Encounter for surgical aftercare following surgery on the circulatory system: Secondary | ICD-10-CM | POA: Diagnosis not present

## 2017-05-06 DIAGNOSIS — Z951 Presence of aortocoronary bypass graft: Secondary | ICD-10-CM | POA: Diagnosis not present

## 2017-05-06 DIAGNOSIS — Z48812 Encounter for surgical aftercare following surgery on the circulatory system: Secondary | ICD-10-CM | POA: Diagnosis not present

## 2017-05-07 DIAGNOSIS — Z48812 Encounter for surgical aftercare following surgery on the circulatory system: Secondary | ICD-10-CM | POA: Diagnosis not present

## 2017-05-07 DIAGNOSIS — Z951 Presence of aortocoronary bypass graft: Secondary | ICD-10-CM | POA: Diagnosis not present

## 2017-05-11 DIAGNOSIS — Z48812 Encounter for surgical aftercare following surgery on the circulatory system: Secondary | ICD-10-CM | POA: Diagnosis not present

## 2017-05-11 DIAGNOSIS — Z951 Presence of aortocoronary bypass graft: Secondary | ICD-10-CM | POA: Diagnosis not present

## 2017-05-12 ENCOUNTER — Other Ambulatory Visit: Payer: Self-pay

## 2017-05-12 ENCOUNTER — Telehealth: Payer: Self-pay | Admitting: Cardiology

## 2017-05-12 ENCOUNTER — Encounter (HOSPITAL_COMMUNITY): Payer: Self-pay

## 2017-05-12 ENCOUNTER — Emergency Department (HOSPITAL_COMMUNITY)
Admission: EM | Admit: 2017-05-12 | Discharge: 2017-05-12 | Disposition: A | Discharge status: Home / Self Care | Payer: 59 | Attending: Emergency Medicine | Admitting: Emergency Medicine

## 2017-05-12 ENCOUNTER — Emergency Department (HOSPITAL_COMMUNITY): Payer: 59

## 2017-05-12 DIAGNOSIS — J90 Pleural effusion, not elsewhere classified: Secondary | ICD-10-CM | POA: Diagnosis not present

## 2017-05-12 DIAGNOSIS — Z79899 Other long term (current) drug therapy: Secondary | ICD-10-CM | POA: Diagnosis not present

## 2017-05-12 DIAGNOSIS — Z7982 Long term (current) use of aspirin: Secondary | ICD-10-CM | POA: Insufficient documentation

## 2017-05-12 DIAGNOSIS — R079 Chest pain, unspecified: Secondary | ICD-10-CM | POA: Insufficient documentation

## 2017-05-12 DIAGNOSIS — Z951 Presence of aortocoronary bypass graft: Secondary | ICD-10-CM | POA: Insufficient documentation

## 2017-05-12 DIAGNOSIS — R0789 Other chest pain: Secondary | ICD-10-CM | POA: Diagnosis not present

## 2017-05-12 DIAGNOSIS — I2510 Atherosclerotic heart disease of native coronary artery without angina pectoris: Secondary | ICD-10-CM | POA: Diagnosis not present

## 2017-05-12 DIAGNOSIS — K743 Primary biliary cirrhosis: Secondary | ICD-10-CM | POA: Diagnosis not present

## 2017-05-12 DIAGNOSIS — H1589 Other disorders of sclera: Secondary | ICD-10-CM | POA: Insufficient documentation

## 2017-05-12 LAB — HCG, QUANTITATIVE, PREGNANCY: hCG, Beta Chain, Quant, S: 1 m[IU]/mL (ref <5)

## 2017-05-12 LAB — CBC
HCT: 36.2 % (ref 36.0–46.0)
Hemoglobin: 11.8 g/dL — ABNORMAL LOW (ref 12.0–15.0)
MCH: 30.3 pg (ref 26.0–34.0)
MCHC: 32.6 g/dL (ref 30.0–36.0)
MCV: 92.8 fL (ref 78.0–100.0)
Platelets: 256 10*3/uL (ref 150–400)
RBC: 3.9 MIL/uL (ref 3.87–5.11)
RDW: 14.7 % (ref 11.5–15.5)
WBC: 5.5 10*3/uL (ref 4.0–10.5)

## 2017-05-12 LAB — HEPATIC FUNCTION PANEL
ALBUMIN: 2.5 g/dL — AB (ref 3.5–5.0)
ALK PHOS: 331 U/L — AB (ref 38–126)
ALT: 71 U/L — ABNORMAL HIGH (ref 14–54)
AST: 111 U/L — AB (ref 15–41)
BILIRUBIN INDIRECT: 3.4 mg/dL — AB (ref 0.3–0.9)
Bilirubin, Direct: 4.7 mg/dL — ABNORMAL HIGH (ref 0.1–0.5)
TOTAL PROTEIN: 5.9 g/dL — AB (ref 6.5–8.1)
Total Bilirubin: 8.1 mg/dL — ABNORMAL HIGH (ref 0.3–1.2)

## 2017-05-12 LAB — BASIC METABOLIC PANEL
Anion gap: 12 (ref 5–15)
BUN: <5 mg/dL — ABNORMAL LOW (ref 6–20)
CO2: 21 mmol/L — ABNORMAL LOW (ref 22–32)
Calcium: 8.8 mg/dL — ABNORMAL LOW (ref 8.9–10.3)
Chloride: 103 mmol/L (ref 101–111)
Creatinine, Ser: 0.47 mg/dL (ref 0.44–1.00)
GFR calc Af Amer: >60 mL/min (ref >60)
GFR calc non Af Amer: >60 mL/min (ref >60)
Glucose, Bld: 100 mg/dL — ABNORMAL HIGH (ref 65–99)
Potassium: 3.3 mmol/L — ABNORMAL LOW (ref 3.5–5.1)
Sodium: 136 mmol/L (ref 135–145)

## 2017-05-12 LAB — I-STAT BETA HCG BLOOD, ED (MC, WL, AP ONLY): I-stat hCG, quantitative: 290.2 m[IU]/mL — ABNORMAL HIGH (ref <5)

## 2017-05-12 LAB — I-STAT TROPONIN, ED: Troponin i, poc: 0 ng/mL (ref 0.00–0.08)

## 2017-05-12 LAB — TROPONIN I: Troponin I: <0.03 ng/mL (ref <0.03)

## 2017-05-12 MED ORDER — NITROGLYCERIN 0.4 MG SL SUBL
0.4000 mg | SUBLINGUAL_TABLET | Freq: Once | SUBLINGUAL | Status: DC
  Filled 2017-05-12: qty 1

## 2017-05-12 MED ORDER — ASPIRIN 81 MG PO CHEW
243.0000 mg | CHEWABLE_TABLET | Freq: Once | ORAL | Status: AC
Start: 2017-05-12 — End: 2017-05-12
  Administered 2017-05-12: 243 mg via ORAL
  Filled 2017-05-12: qty 3

## 2017-05-12 MED ORDER — NITROGLYCERIN 0.4 MG SL SUBL: 0.4000 mg | SUBLINGUAL_TABLET | SUBLINGUAL | 6 refills | Status: DC | PRN

## 2017-05-12 MED FILL — NITROGLYCERIN 0.4 MG TAB SL: 0.4 | 8 days supply | Qty: 25 | Fill #0

## 2017-05-12 NOTE — ED Triage Notes (Signed)
PT presents to ED with complaints of central chest pain that began today while fixing lunch. PT denies sob, n/v, diaphoresis. PT had CABGx2 1/29

## 2017-05-12 NOTE — Telephone Encounter (Signed)
Left voicemail for patient to call the office 

## 2017-05-12 NOTE — ED Notes (Signed)
Cardiology at bedside.

## 2017-05-12 NOTE — Telephone Encounter (Signed)
Having some "issues" since her procedure and also noticed that nitro was missing from her med list so does that mean she's not supposed to take it anymore

## 2017-05-12 NOTE — ED Provider Notes (Signed)
[facility] EMERGENCY DEPARTMENT Provider Note   CSN: [id number] Arrival date & time: 05/12/17  1410     History   Chief Complaint Chief Complaint  Patient presents with  . Chest Pain    HPI Christine Butler is a 47 y.o. female.  The history is provided by the patient and medical records. No language interpreter was used.   Christine Butler is a 47 y.o. female  with a PMH as listed below including CAD s/p CABG x 2 on January 29th who presents to the Emergency Department complaining of central chest pain which began at approximately 12:30 PM today.  Patient reports that she was paying her bills when she felt a tightness to her central chest.  She got up and walked around which made her chest pain worse.  She denies any associated shortness of breath, nausea, vomiting or diaphoresis.  No abdominal pain.  She tried to find her nitroglycerin, but could not, therefore no medications were taken prior to arrival.  She did take an 81 mg aspirin along with her typical daily home meds this morning.  While in the waiting room, she felt as if her symptoms were improving, however walking from waiting room to examination room did cause her chest pain to return.  She states that the tightness feels similar to her previous cardiac pain, however her previous pain was a bilateral squeezing sensation, not focally over 1 area of the chest.  Past Medical History:  Diagnosis Date  . Arthritis    right knee  . GERD (gastroesophageal reflux disease)   . IBS (irritable bowel syndrome)   . PONV (postoperative nausea and vomiting)   . Primary biliary cholangitis ([place])   . Primary biliary cirrhosis ([place])   . SVD (spontaneous vaginal delivery)    x 3  . Urinary tract bacterial infections     Patient Active Problem List   Diagnosis Date Noted  . Chest pain with high risk for cardiac etiology   . Acute blood loss anemia 04/20/2017  . Elevated LFTs 04/20/2017  . Coronary artery disease  03/24/2017  . S/P CABG x 2 03/24/2017  . CAD (coronary artery disease), native coronary artery 03/23/2017  . Primary biliary cholangitis ([place]) 03/16/2017  . GERD (gastroesophageal reflux disease) 01/28/2012  . IBS (irritable bowel syndrome) 01/28/2012    Past Surgical History:  Procedure Laterality Date  . CORONARY ARTERY BYPASS GRAFT N/A 03/24/2017   Procedure: CORONARY ARTERY BYPASS GRAFTING (CABG) x two, using left internal mammary artery, left radial artery, and right leg greater saphenous vein harvested endoscopically;  Surgeon: Ivin Poot, MD;  Location: [place] [facility];  Service: Open Heart Surgery;  Laterality: N/A;  . ENDOVEIN HARVEST OF GREATER SAPHENOUS VEIN Right 03/24/2017   Procedure: ENDOVEIN HARVEST OF GREATER SAPHENOUS VEIN;  Surgeon: Ivin Poot, MD;  Location: [facility];  Service: Open Heart Surgery;  Laterality: Right;  . INCONTINENCE SURGERY    . LEEP N/A 03/28/2014   Procedure: LOOP ELECTROSURGICAL EXCISION PROCEDURE (LEEP) cone biopsy;  Surgeon: Cyril Mourning, MD;  Location: [facility];  Service: Gynecology;  Laterality: N/A;  . LEFT HEART CATH AND CORONARY ANGIOGRAPHY N/A 03/23/2017   Procedure: LEFT HEART CATH AND CORONARY ANGIOGRAPHY;  Surgeon: Jettie Booze, MD;  Location: [facility];  Service: Cardiovascular;  Laterality: N/A;  . LIVER BIOPSY     x 2  . RADIAL ARTERY HARVEST Left 03/24/2017   Procedure: RADIAL ARTERY HARVEST;  Surgeon: Prescott Gum,  Collier Salina, MD;  Location: [place];  Service: Open Heart Surgery;  Laterality: Left;  . TEE WITHOUT CARDIOVERSION N/A 03/24/2017   Procedure: TRANSESOPHAGEAL ECHOCARDIOGRAM (TEE);  Surgeon: Prescott Gum, Collier Salina, MD;  Location: [facility];  Service: Open Heart Surgery;  Laterality: N/A;  . WISDOM TOOTH EXTRACTION      OB History    Gravida Para Term Preterm AB Living   3 3           SAB TAB Ectopic Multiple Live Births                  Obstetric Comments   None       Home Medications    Prior to Admission medications    Medication Sig Start Date End Date Taking? Authorizing Provider  aspirin EC 81 MG tablet Take 1 tablet (81 mg total) by mouth daily. 05/04/17  Yes Barrett, Erin R, PA-C  Calcium Carbonate-Vitamin D (CALCIUM + D PO) Take 1 tablet by mouth 2 (two) times a week.    Yes [provider]  cholecalciferol (VITAMIN D) 1000 units tablet Take 2,000 Units by mouth 2 (two) times a week.    Yes [provider]  cycloSPORINE (RESTASIS) 0.05 % ophthalmic emulsion Place 1 drop into both eyes daily.   Yes [provider]  diphenhydrAMINE (BENADRYL) 25 mg capsule Take 50 mg by mouth at bedtime as needed for itching.   Yes [provider]  isosorbide mononitrate (IMDUR) 30 MG 24 hr tablet Take 0.5 tablets (15 mg total) by mouth daily. 04/03/17  Yes Elgie Collard, PA-C  Melatonin 3 MG TABS Take 6 mg by mouth at bedtime.   Yes [provider]  metoprolol tartrate (LOPRESSOR) 25 MG tablet Take 0.5 tablets (12.5 mg total) by mouth 2 (two) times daily. 04/03/17  Yes Conte, Tessa N, PA-C  Multiple Vitamin (MULTIVITAMIN WITH MINERALS) TABS Take 1 tablet by mouth 2 (two) times a week.    Yes [provider]  nitroGLYCERIN (NITROSTAT) 0.4 MG SL tablet Place 1 tablet (0.4 mg total) under the tongue every 5 (five) minutes as needed for chest pain. 05/12/17 08/10/17 Yes Revankar, Reita Cliche, MD  omeprazole (PRILOSEC) 10 MG capsule Take 20 mg by mouth daily.   Yes [provider]  Probiotic Product (PROBIOTIC PO) Take 1 tablet by mouth daily.   Yes [provider]  ursodiol (ACTIGALL) 300 MG capsule Take 600-900 mg by mouth 2 (two) times daily. 900 mg in am,  600 mg in pm   Yes [provider]  ALPRAZolam (XANAX) 0.25 MG tablet Take 1 tablet (0.25 mg total) by mouth 2 (two) times daily as needed for anxiety or sleep. Patient not taking: Reported on 05/12/2017 04/03/17   Elgie Collard, PA-C  lactulose (CHRONULAC) 10 GM/15ML solution Take 45 mLs (30 g total) by  mouth daily. Patient not taking: Reported on 05/12/2017 04/03/17   Elgie Collard, PA-C  ondansetron (ZOFRAN) 4 MG tablet Take 1 tablet (4 mg total) by mouth every 8 (eight) hours as needed for nausea or vomiting. Patient not taking: Reported on 05/12/2017 04/03/17   Elgie Collard, PA-C    Family History Family History  Problem Relation Age of Onset  . CAD Father   . Diabetes Mellitus II Father     Social History Social History   Tobacco Use  . Smoking status: Never Smoker  . Smokeless tobacco: Never Used  Substance Use Topics  . Alcohol use: Yes  Alcohol/week: 0.6 oz    Types: 1 Glasses of wine per week    Comment: for dinner  . Drug use: No     Allergies   Codeine; Erythromycin; and Fentanyl   Review of Systems Review of Systems  Cardiovascular: Positive for chest pain. Negative for palpitations and leg swelling.  Gastrointestinal: Negative for abdominal pain, nausea and vomiting.  All other systems reviewed and are negative.    Physical Exam Updated Vital Signs BP 108/72   Pulse 79   Temp 97.9 F (36.6 C) (Oral)   Resp 17   LMP 04/20/2017   SpO2 99%   Physical Exam  Constitutional: She is oriented to person, place, and time. She appears well-developed and well-nourished. No distress.  HENT:  Head: Normocephalic and atraumatic.  Eyes: Scleral icterus is present.  Cardiovascular: Normal rate, regular rhythm and normal heart sounds.  No murmur heard. Pulmonary/Chest: Effort normal and breath sounds normal. No respiratory distress.  Abdominal: Soft. She exhibits no distension. There is no tenderness.  Musculoskeletal: She exhibits no edema.  Neurological: She is alert and oriented to person, place, and time.  Skin: Skin is warm and dry.  Nursing note and vitals reviewed.    ED Treatments / Results  Labs (all labs ordered are listed, but only abnormal results are displayed) Labs Reviewed  BASIC METABOLIC PANEL - Abnormal; Notable for the following  components:      Result Value   Potassium 3.3 (*)    CO2 21 (*)    Glucose, Bld 100 (*)    BUN <5 (*)    Calcium 8.8 (*)    All other components within normal limits  CBC - Abnormal; Notable for the following components:   Hemoglobin 11.8 (*)    All other components within normal limits  HEPATIC FUNCTION PANEL - Abnormal; Notable for the following components:   Total Protein 5.9 (*)    Albumin 2.5 (*)    AST 111 (*)    ALT 71 (*)    Alkaline Phosphatase 331 (*)    Total Bilirubin 8.1 (*)    Bilirubin, Direct 4.7 (*)    Indirect Bilirubin 3.4 (*)    All other components within normal limits  I-STAT BETA HCG BLOOD, ED ([facility], [facility], [facility] ONLY) - Abnormal; Notable for the following components:   I-stat hCG, quantitative 290.2 (*)    All other components within normal limits  HCG, QUANTITATIVE, PREGNANCY  TROPONIN I  I-STAT TROPONIN, ED    EKG  EKG Interpretation  Date/Time:  Tuesday May 12 2017 14:23:17 EDT Ventricular Rate:  86 PR Interval:  146 QRS Duration: 76 QT Interval:  380 QTC Calculation: 454 R Axis:   19 Text Interpretation:  Normal sinus rhythm Nonspecific ST abnormality Abnormal ECG Confirmed by Virgel Manifold [phone]) on 05/12/2017 3:10:57 PM       Radiology Dg Chest 2 View  Result Date: 05/12/2017 CLINICAL DATA:  Seven weeks post CABG with sudden onset of pressure at mid sternum since 1200 hours, history GERD EXAM: CHEST - 2 VIEW COMPARISON:  05/04/2017 FINDINGS: Borderline enlargement of cardiac silhouette post CABG. Mediastinal contours and pulmonary vascularity normal. Mild RIGHT basilar atelectasis and tiny pleural effusion. Remaining lungs clear. No pleural effusion or pneumothorax. Bones unremarkable. IMPRESSION: Borderline enlargement of cardiac silhouette post CABG. Mild RIGHT basilar atelectasis and tiny pleural effusion. Electronically Signed   By: Lavonia Dana M.D.   On: 05/12/2017 15:04    Procedures Procedures (including critical care  time)  Medications Ordered in ED Medications  nitroGLYCERIN (NITROSTAT) SL tablet 0.4 mg (0 mg Sublingual Hold 05/12/17 1639)  aspirin chewable tablet 243 mg (243 mg Oral Given 05/12/17 1608)     Initial Impression / Assessment and Plan / ED Course  I have reviewed the triage vital signs and the nursing notes.  Pertinent labs & imaging results that were available during my care of the patient were reviewed by me and considered in my medical decision making (see chart for details).    Christine Butler is a 47 y.o. female who presents to ED for exertional chest pain which began at 12:30 PM today.  History of prior CABG on January 29th. Initial troponin negative. EKG with subtle t wave changes in v2,v3. Patient with hx of primary biliary cholangitis with hx of elevated liver labs since surgery. This is being closely followed as outpatient. Tbili lower today than it has been in the last month. Remainder of hepatic function labs baseline. Initial I-stat hcg negative, but formal quant negative.   4:20 PM - Cardiology consulted who will come evaluate patient.   Evaluated by cardiology. Please see note for full details.  Appreciate their assistance with patient care today.  Per cardiology, will obtain another troponin.  If negative, can discharge home with outpatient follow-up.  If elevated, will reconsult.  Repeat trop negative. Discharged home in stable condition. Discussed follow up care plan and return precautions. All questions answered.   Patient discussed with Dr. Wilson Singer who agrees with treatment plan.    Final Clinical Impressions(s) / ED Diagnoses   Final diagnoses:  Atypical chest pain    ED Discharge Orders    None       Clydia Nieves, Ozella Almond, PA-C 05/12/17 2024    Virgel Manifold, MD 05/13/17 1450

## 2017-05-12 NOTE — Consult Note (Addendum)
CARDIOLOGY CONSULT NOTE    Patient ID: Christine Butler; [id number]; Jan 02, 1971   Admit date: 05/12/2017 Date of Consult: 05/12/2017  Primary Care Provider: Patient, No Pcp Per Primary Cardiologist: [name], MD  Patient Profile:   Christine Butler is a 47 y.o. female with a PMHx notable for CAD s/p 2 vessel CABG on 03/24/2017, PBC, IBS and GERD. She underwent LHC on 03/24/2017 with prompt CABG resulting in a LIMA to the dominant diagonal and  left radial artery free graft to left anterior descending with interposition vein graft at the proximal anastomosis. Her EF was noted to be 50-55% on Echo following the surgery. She was discharged home on that admission is stable condition with close follow-up.   History of Present Illness:   Christine Butler is a 47 yo F who presents with acute onset chest pressure that she describes as being similar but not identical to her initial cardiac chest discomfort which lead to her CABG. The pain/pressure is located to either side of her sternum, worse with activity, better with rest. She is unable to locate her sublingual NTG. This pain was most concerting today following significant cardiac rehab including the use of resistance bands involving her pectoralis muscles. She denied dizziness, nausea, vomiting, diaphoresis, radiating pain, back pain, or arm pain. Given her recent cardiac procedure she opted to be evaluted at the ED, "just to be sure".  In the ED, EKG was unremarkable, troponin negative. I personally reviewed the CXR noting mild right basilar atelectasis and probable pleural effusion. No clear focal opacity or pneumothorax. BMP was remarkable for mild hypokalemia of 3.3. CBC demonstrated a Hgb of 11.8, slightly increased from her last recorded Hgb one month prior. Cardiology was consulted for evaluation. Patient had positive i-STAT beta hCG with pending quantitative beta-hCG.  Past Medical History:  Diagnosis Date  . Arthritis    right knee  .  GERD (gastroesophageal reflux disease)   . IBS (irritable bowel syndrome)   . PONV (postoperative nausea and vomiting)   . Primary biliary cholangitis ([place])   . Primary biliary cirrhosis ([place])   . SVD (spontaneous vaginal delivery)    x 3  . Urinary tract bacterial infections     Past Surgical History:  Procedure Laterality Date  . CORONARY ARTERY BYPASS GRAFT N/A 03/24/2017   Procedure: CORONARY ARTERY BYPASS GRAFTING (CABG) x two, using left internal mammary artery, left radial artery, and right leg greater saphenous vein harvested endoscopically;  Surgeon: Ivin Poot, MD;  Location: [facility];  Service: Open Heart Surgery;  Laterality: N/A;  . ENDOVEIN HARVEST OF GREATER SAPHENOUS VEIN Right 03/24/2017   Procedure: ENDOVEIN HARVEST OF GREATER SAPHENOUS VEIN;  Surgeon: Ivin Poot, MD;  Location: [facility];  Service: Open Heart Surgery;  Laterality: Right;  . INCONTINENCE SURGERY    . LEEP N/A 03/28/2014   Procedure: LOOP ELECTROSURGICAL EXCISION PROCEDURE (LEEP) cone biopsy;  Surgeon: Cyril Mourning, MD;  Location: [facility];  Service: Gynecology;  Laterality: N/A;  . LEFT HEART CATH AND CORONARY ANGIOGRAPHY N/A 03/23/2017   Procedure: LEFT HEART CATH AND CORONARY ANGIOGRAPHY;  Surgeon: Jettie Booze, MD;  Location: [facility];  Service: Cardiovascular;  Laterality: N/A;  . LIVER BIOPSY     x 2  . RADIAL ARTERY HARVEST Left 03/24/2017   Procedure: RADIAL ARTERY HARVEST;  Surgeon: Ivin Poot, MD;  Location: [facility];  Service: Open Heart Surgery;  Laterality: Left;  . TEE WITHOUT  CARDIOVERSION N/A 03/24/2017   Procedure: TRANSESOPHAGEAL ECHOCARDIOGRAM (TEE);  Surgeon: Prescott Gum, Collier Salina, MD;  Location: [facility];  Service: Open Heart Surgery;  Laterality: N/A;  . WISDOM TOOTH EXTRACTION       Home Medications:  Prior to Admission medications   Medication Sig Start Date End Date Taking? Authorizing Provider  aspirin EC 81 MG tablet Take 1 tablet (81 mg total) by mouth daily.  05/04/17  Yes Barrett, Erin R, PA-C  Calcium Carbonate-Vitamin D (CALCIUM + D PO) Take 1 tablet by mouth 2 (two) times a week.    Yes [provider]  cholecalciferol (VITAMIN D) 1000 units tablet Take 2,000 Units by mouth 2 (two) times a week.    Yes [provider]  cycloSPORINE (RESTASIS) 0.05 % ophthalmic emulsion Place 1 drop into both eyes daily.   Yes [provider]  diphenhydrAMINE (BENADRYL) 25 mg capsule Take 50 mg by mouth at bedtime as needed for itching.   Yes [provider]  isosorbide mononitrate (IMDUR) 30 MG 24 hr tablet Take 0.5 tablets (15 mg total) by mouth daily. 04/03/17  Yes Elgie Collard, PA-C  Melatonin 3 MG TABS Take 6 mg by mouth at bedtime.   Yes [provider]  metoprolol tartrate (LOPRESSOR) 25 MG tablet Take 0.5 tablets (12.5 mg total) by mouth 2 (two) times daily. 04/03/17  Yes Conte, Tessa N, PA-C  Multiple Vitamin (MULTIVITAMIN WITH MINERALS) TABS Take 1 tablet by mouth 2 (two) times a week.    Yes [provider]  nitroGLYCERIN (NITROSTAT) 0.4 MG SL tablet Place 1 tablet (0.4 mg total) under the tongue every 5 (five) minutes as needed for chest pain. 05/12/17 08/10/17 Yes Revankar, Reita Cliche, MD  omeprazole (PRILOSEC) 10 MG capsule Take 20 mg by mouth daily.   Yes [provider]  Probiotic Product (PROBIOTIC PO) Take 1 tablet by mouth daily.   Yes [provider]  ursodiol (ACTIGALL) 300 MG capsule Take 600-900 mg by mouth 2 (two) times daily. 900 mg in am,  600 mg in pm   Yes [provider]  ALPRAZolam (XANAX) 0.25 MG tablet Take 1 tablet (0.25 mg total) by mouth 2 (two) times daily as needed for anxiety or sleep. Patient not taking: Reported on 05/12/2017 04/03/17   Elgie Collard, PA-C  lactulose (CHRONULAC) 10 GM/15ML solution Take 45 mLs (30 g total) by mouth daily. Patient not taking: Reported on 05/12/2017 04/03/17   Elgie Collard, PA-C  ondansetron (ZOFRAN) 4 MG tablet Take 1 tablet (4  mg total) by mouth every 8 (eight) hours as needed for nausea or vomiting. Patient not taking: Reported on 05/12/2017 04/03/17   Elgie Collard, PA-C    Inpatient Medications: Scheduled Meds: . nitroGLYCERIN  0.4 mg Sublingual Once   Continuous Infusions:  PRN Meds:   Allergies:    Allergies  Allergen Reactions  . Codeine Nausea And Vomiting  . Erythromycin Nausea And Vomiting  . Fentanyl Nausea And Vomiting    Social History:   Social History   Socioeconomic History  . Marital status: Divorced    Spouse name: Not on file  . Number of children: 3  . Years of education: 59  . Highest education level: Associate degree: academic program  Social Needs  . Financial resource strain: Not hard at all  . Food insecurity - worry: Never true  . Food insecurity - inability: Never true  . Transportation needs - medical: No  . Transportation needs -  non-medical: No  Occupational History  . Not on file  Tobacco Use  . Smoking status: Never Smoker  . Smokeless tobacco: Never Used  Substance and Sexual Activity  . Alcohol use: Yes    Alcohol/week: 0.6 oz    Types: 1 Glasses of wine per week    Comment: for dinner  . Drug use: No  . Sexual activity: Yes    Partners: Male    Birth control/protection: None  Other Topics Concern  . Not on file  Social History Narrative  . Not on file    Family History:    Family History  Problem Relation Age of Onset  . CAD Father   . Diabetes Mellitus II Father      ROS:  Please see the history of present illness.  ROS  All other ROS reviewed and negative.     Physical Exam/Data:   Vitals:   05/12/17 1530 05/12/17 1545 05/12/17 1630 05/12/17 1700  BP: 107/74 125/89 105/78 139/74  Pulse: 79 83 74 84  Resp: 15 (!) 22 15 (!) 22  Temp:      TempSrc:      SpO2: 99% 100% 99% 100%   No intake or output data in the 24 hours ending 05/12/17 1713 There were no vitals filed for this visit. There is no height or weight on file to  calculate BMI.  General:  Well nourished, well developed, in no acute distress HEENT: normal Lymph: no adenopathy Neck: no JVD Endocrine:  No thryomegaly Vascular: No carotid bruits; FA pulses 2+ bilaterally without bruits  Cardiac:  normal S1, S2; RRR; GIII systolic murmur auscultated  Lungs:  clear to auscultation bilaterally, no wheezing, rhonchi or rales  Abd: soft, nontender, no hepatomegaly  Ext: no edema Musculoskeletal:  No deformities, BUE and BLE strength normal and equal Skin: warm and dry  Neuro:  CNs 2-12 intact, no focal abnormalities noted Psych:  Normal affect   EKG:  The EKG was personally reviewed and demonstrates:  NSR  Telemetry:  Telemetry was personally reviewed and demonstrates:  NSR  Relevant CV Studies:  CABG 03/24/2017: OPERATIVE REPORT  OPERATIONS: 1. Coronary artery bypass grafting x2 (left internal mammary artery to     dominant diagonal, left radial artery free graft to left anterior     descending with interposition vein graft at the proximal     anastomosis). 2. Harvest of left radial artery as a free graft. 3. Right greater saphenous vein endoscopic harvest.  Echo 04/01/2017 Study Conclusions - Left ventricle: The cavity size was normal. Wall thickness was   normal. Systolic function was normal. The estimated ejection   fraction was in the range of 50% to 55%. Wall motion was normal;   there were no regional wall motion abnormalities. Left   ventricular diastolic function parameters were normal for the   patient&'s age. - Right atrium: The atrium was mildly dilated.  LHC 03/23/2017  Mid LAD lesion is 99% stenosed.  Ost LM lesion is 40% stenosed.  Prox RCA lesion is 25% stenosed.  The left ventricular systolic function is normal.  LV end diastolic pressure is normal.  The left ventricular ejection fraction is 55-65% by visual estimate.  There is no aortic valve stenosis.  If cath was needed in the future, would not use radial  approach.   Severe LAD disease at a bifurcation of a large diagonal vessel.  Lesion is suboptimal for PCI.  Plan for cardiac surgery consult.   Due to critical  nature of lesion and rest pain, will start heparin and admit.  THere may be some collaterals to the distal LAD based on the flow pattern.    Laboratory Data:  Chemistry Recent Labs  Lab 05/12/17 1425  NA 136  K 3.3*  CL 103  CO2 21*  GLUCOSE 100*  BUN <5*  CREATININE 0.47  CALCIUM 8.8*  GFRNONAA >60  GFRAA >60  ANIONGAP 12    No results for input(s): PROT, ALBUMIN, AST, ALT, ALKPHOS, BILITOT in the last 168 hours. Hematology Recent Labs  Lab 05/12/17 1425  WBC 5.5  RBC 3.90  HGB 11.8*  HCT 36.2  MCV 92.8  MCH 30.3  MCHC 32.6  RDW 14.7  PLT 256   Cardiac EnzymesNo results for input(s): TROPONINI in the last 168 hours.  Recent Labs  Lab 05/12/17 1453  TROPIPOC 0.00    BNPNo results for input(s): BNP, PROBNP in the last 168 hours.  DDimer No results for input(s): DDIMER in the last 168 hours.  Radiology/Studies:  Dg Chest 2 View  Result Date: 05/12/2017 CLINICAL DATA:  Seven weeks post CABG with sudden onset of pressure at mid sternum since 1200 hours, history GERD EXAM: CHEST - 2 VIEW COMPARISON:  05/04/2017 FINDINGS: Borderline enlargement of cardiac silhouette post CABG. Mediastinal contours and pulmonary vascularity normal. Mild RIGHT basilar atelectasis and tiny pleural effusion. Remaining lungs clear. No pleural effusion or pneumothorax. Bones unremarkable. IMPRESSION: Borderline enlargement of cardiac silhouette post CABG. Mild RIGHT basilar atelectasis and tiny pleural effusion. Electronically Signed   By: Lavonia Dana M.D.   On: 05/12/2017 15:04   Assessment and Plan:   Assessment: Tauna Macfarlane is a 47 yo F with a PMHx notable for recent 2 vessel CABG, who presents with acute onset substernal chest pressure that closely resembled her previous chest pressure sensation. It appears that his this is  more pleuritic in nature and likely to be cardiac in origin. Additionally EKG and troponin were unremarkable.  Plan: Chest Pain:  The chest pain appears to be more pleuritic in nature. I Pl. order to repeat a second troponin. If this troponins negative she is in agreement to be discharged home. We discussed this at length with the patient who is in agreement with plan to repeat cardiac troponin enzyme test and if clear to released home. Given her recent procedure, EKG, troponin, history and physical exam it is unlikely that this chest pain is cardiac in origin.  --Recommended and ordered a second troponin  --Supplied patient with script for NTG sublingual.  --Patient stable for discharge pending results of her troponin.  Thank you for the consult.   For questions or updates, please contact [place] Please consult [email or web address] for contact info under Cardiology/STEMI.   Please see attending note/attestation for current assessment and plan.  Signed, Kathi Ludwig, MD Internal Medicine, PGY-1 05/12/2017 5:13 PM

## 2017-05-12 NOTE — Discharge Instructions (Signed)
Continue follow up with cardiology as directed.   Return to ER for new or worsening symptoms, any additional concerns.

## 2017-05-12 NOTE — Telephone Encounter (Signed)
Spoke with patient; she informed me that she had went to the hospital due to her concern.

## 2017-05-14 DIAGNOSIS — Z48812 Encounter for surgical aftercare following surgery on the circulatory system: Secondary | ICD-10-CM | POA: Diagnosis not present

## 2017-05-14 DIAGNOSIS — Z951 Presence of aortocoronary bypass graft: Secondary | ICD-10-CM | POA: Diagnosis not present

## 2017-05-15 MED FILL — URSODIOL 300 MG CAPSULE: 300 | 18 days supply | Qty: 90 | Fill #1

## 2017-05-15 MED FILL — RESTASIS MULTIDOSE 0.05% EY: 0.05 | 30 days supply | Qty: 6 | Fill #2

## 2017-05-15 MED FILL — ISOSORBIDE MN ER 30 MG TAB: 30 | 60 days supply | Qty: 30 | Fill #1

## 2017-05-18 DIAGNOSIS — Z48812 Encounter for surgical aftercare following surgery on the circulatory system: Secondary | ICD-10-CM | POA: Diagnosis not present

## 2017-05-18 DIAGNOSIS — Z951 Presence of aortocoronary bypass graft: Secondary | ICD-10-CM | POA: Diagnosis not present

## 2017-05-20 DIAGNOSIS — Z951 Presence of aortocoronary bypass graft: Secondary | ICD-10-CM | POA: Diagnosis not present

## 2017-05-20 DIAGNOSIS — Z48812 Encounter for surgical aftercare following surgery on the circulatory system: Secondary | ICD-10-CM | POA: Diagnosis not present

## 2017-05-21 DIAGNOSIS — Z48812 Encounter for surgical aftercare following surgery on the circulatory system: Secondary | ICD-10-CM | POA: Diagnosis not present

## 2017-05-21 DIAGNOSIS — Z951 Presence of aortocoronary bypass graft: Secondary | ICD-10-CM | POA: Diagnosis not present

## 2017-05-25 DIAGNOSIS — Z951 Presence of aortocoronary bypass graft: Secondary | ICD-10-CM | POA: Diagnosis not present

## 2017-05-25 DIAGNOSIS — Z48812 Encounter for surgical aftercare following surgery on the circulatory system: Secondary | ICD-10-CM | POA: Diagnosis not present

## 2017-05-26 ENCOUNTER — Ambulatory Visit: Payer: 59 | Admitting: Cardiology

## 2017-05-26 ENCOUNTER — Encounter: Payer: Self-pay | Admitting: Cardiology

## 2017-05-26 VITALS — BP 122/80 | HR 82 | Ht 62.0 in | Wt 145.4 lb

## 2017-05-26 DIAGNOSIS — Z951 Presence of aortocoronary bypass graft: Secondary | ICD-10-CM

## 2017-05-26 DIAGNOSIS — I2510 Atherosclerotic heart disease of native coronary artery without angina pectoris: Secondary | ICD-10-CM

## 2017-05-26 DIAGNOSIS — K743 Primary biliary cirrhosis: Secondary | ICD-10-CM | POA: Diagnosis not present

## 2017-05-26 DIAGNOSIS — R7989 Other specified abnormal findings of blood chemistry: Secondary | ICD-10-CM

## 2017-05-26 DIAGNOSIS — R945 Abnormal results of liver function studies: Secondary | ICD-10-CM

## 2017-05-26 MED ORDER — METOPROLOL TARTRATE 25 MG PO TABS: 12.5000 mg | ORAL_TABLET | Freq: Two times a day (BID) | ORAL | 3 refills | Status: DC

## 2017-05-26 MED FILL — METOPROLOL TARTRATE 25 MG T: 25 | 60 days supply | Qty: 60 | Fill #1

## 2017-05-26 NOTE — Patient Instructions (Signed)
Medication Instructions:  Your physician recommends that you continue on your current medications as directed. Please refer to the Current Medication list given to you today.  Labwork: None  Testing/Procedures: None  Follow-Up: Your physician recommends that you schedule a follow-up appointment in: 6 months  Any Other Special Instructions Will Be Listed Below (If Applicable).     If you need a refill on your cardiac medications before your next appointment, please call your pharmacy.   [facility]  Ashley A, RN, BSN  

## 2017-05-26 NOTE — Progress Notes (Signed)
Cardiology Office Note:    Date:  05/26/2017   ID:  Christine Butler, DOB 11-Jan-1971, MRN [id number]  PCP:  Patient, No Pcp Per  Cardiologist:  [name], MD   Referring MD: No ref. provider found    ASSESSMENT:    1. Coronary artery disease involving native coronary artery of native heart without angina pectoris   2. S/P CABG x 2   3. Elevated LFTs   4. Primary biliary cholangitis (HCC)    PLAN:    In order of problems listed above:  1. Secondary prevention stressed with the patient.  Importance of compliance with diet and medications stressed and she vocalized understanding.  Her blood pressure stable.  Diet was discussed for dyslipidemia. 2. Importance of regular exercise stressed.  Sublingual nitroglycerin prescription and its use advised.Patient will be seen in follow-up appointment in 6 months or earlier if the patient has any concerns    Medication Adjustments/Labs and Tests Ordered: Current medicines are reviewed at length with the patient today.  Concerns regarding medicines are outlined above.  No orders of the defined types were placed in this encounter.  No orders of the defined types were placed in this encounter.    Chief Complaint  Patient presents with  . Follow-up  . Coronary Artery Disease     History of Present Illness:    Christine Butler is a 47 y.o. female.  The patient has known coronary artery disease and has undergone bypass surgery.  She tells me that she is doing fine now.  No chest pain orthopnea or PND.  At the time of my evaluation, the patient is alert awake oriented and in no distress.  She is beginning to be active and exercising regularly without any symptoms.  She has primary biliary cholangitis is under the care of a gastroenterologist.  She is not on statin therapy for the same reason because of elevated LFTs.  Past Medical History:  Diagnosis Date  . Arthritis    right knee  . GERD (gastroesophageal reflux disease)   . IBS  (irritable bowel syndrome)   . PONV (postoperative nausea and vomiting)   . Primary biliary cholangitis ([place])   . Primary biliary cirrhosis ([place])   . SVD (spontaneous vaginal delivery)    x 3  . Urinary tract bacterial infections     Past Surgical History:  Procedure Laterality Date  . CORONARY ARTERY BYPASS GRAFT N/A 03/24/2017   Procedure: CORONARY ARTERY BYPASS GRAFTING (CABG) x two, using left internal mammary artery, left radial artery, and right leg greater saphenous vein harvested endoscopically;  Surgeon: Ivin Poot, MD;  Location: [place];  Service: Open Heart Surgery;  Laterality: N/A;  . ENDOVEIN HARVEST OF GREATER SAPHENOUS VEIN Right 03/24/2017   Procedure: ENDOVEIN HARVEST OF GREATER SAPHENOUS VEIN;  Surgeon: Ivin Poot, MD;  Location: [place];  Service: Open Heart Surgery;  Laterality: Right;  . INCONTINENCE SURGERY    . LEEP N/A 03/28/2014   Procedure: LOOP ELECTROSURGICAL EXCISION PROCEDURE (LEEP) cone biopsy;  Surgeon: Cyril Mourning, MD;  Location: [facility];  Service: Gynecology;  Laterality: N/A;  . LEFT HEART CATH AND CORONARY ANGIOGRAPHY N/A 03/23/2017   Procedure: LEFT HEART CATH AND CORONARY ANGIOGRAPHY;  Surgeon: Jettie Booze, MD;  Location: [place] [facility];  Service: Cardiovascular;  Laterality: N/A;  . LIVER BIOPSY     x 2  . RADIAL ARTERY HARVEST Left 03/24/2017   Procedure: RADIAL ARTERY HARVEST;  Surgeon: Lucianne Lei  Donney Rankins, MD;  Location: [place];  Service: Open Heart Surgery;  Laterality: Left;  . TEE WITHOUT CARDIOVERSION N/A 03/24/2017   Procedure: TRANSESOPHAGEAL ECHOCARDIOGRAM (TEE);  Surgeon: Prescott Gum, Collier Salina, MD;  Location: [place];  Service: Open Heart Surgery;  Laterality: N/A;  . WISDOM TOOTH EXTRACTION      Current Medications: Current Meds  Medication Sig  . aspirin EC 81 MG tablet Take 1 tablet (81 mg total) by mouth daily.  . Calcium Carbonate-Vitamin D (CALCIUM + D PO) Take 1 tablet by mouth 2 (two) times a week.   .  cholecalciferol (VITAMIN D) 1000 units tablet Take 2,000 Units by mouth 2 (two) times a week.   . cycloSPORINE (RESTASIS) 0.05 % ophthalmic emulsion Place 1 drop into both eyes daily.  . diphenhydrAMINE (BENADRYL) 25 mg capsule Take 50 mg by mouth at bedtime as needed for itching.  . isosorbide mononitrate (IMDUR) 30 MG 24 hr tablet Take 0.5 tablets (15 mg total) by mouth daily.  . Melatonin 3 MG TABS Take 6 mg by mouth at bedtime.  . metoprolol tartrate (LOPRESSOR) 25 MG tablet Take 0.5 tablets (12.5 mg total) by mouth 2 (two) times daily.  . Multiple Vitamin (MULTIVITAMIN WITH MINERALS) TABS Take 1 tablet by mouth 2 (two) times a week.   . nitroGLYCERIN (NITROSTAT) 0.4 MG SL tablet Place 1 tablet (0.4 mg total) under the tongue every 5 (five) minutes as needed for chest pain.  Marland Kitchen omeprazole (PRILOSEC) 10 MG capsule Take 20 mg by mouth as needed.   . Probiotic Product (PROBIOTIC PO) Take 1 tablet by mouth daily.  . ursodiol (ACTIGALL) 300 MG capsule Take 600-900 mg by mouth 2 (two) times daily. 900 mg in am,  600 mg in pm     Allergies:   Codeine; Erythromycin; and Fentanyl   Social History   Socioeconomic History  . Marital status: Divorced    Spouse name: Not on file  . Number of children: 3  . Years of education: 26  . Highest education level: Associate degree: academic program  Occupational History  . Not on file  Social Needs  . Financial resource strain: Not hard at all  . Food insecurity:    Worry: Never true    Inability: Never true  . Transportation needs:    Medical: No    Non-medical: No  Tobacco Use  . Smoking status: Never Smoker  . Smokeless tobacco: Never Used  Substance and Sexual Activity  . Alcohol use: Yes    Alcohol/week: 0.6 oz    Types: 1 Glasses of wine per week    Comment: for dinner  . Drug use: No  . Sexual activity: Yes    Partners: Male    Birth control/protection: None  Lifestyle  . Physical activity:    Days per week: 0 days    Minutes per  session: 0 min  . Stress: Not at all  Relationships  . Social connections:    Talks on phone: More than three times a week    Gets together: More than three times a week    Attends religious service: More than 4 times per year    Active member of club or organization: Yes    Attends meetings of clubs or organizations: More than 4 times per year    Relationship status: Divorced  Other Topics Concern  . Not on file  Social History Narrative  . Not on file     Family History: The patient's family  history includes CAD in her father; Diabetes Mellitus II in her father.  ROS:   Please see the history of present illness.    All other systems reviewed and are negative.  EKGs/Labs/Other Studies Reviewed:    The following studies were reviewed today: I reviewed her lab work done last time and discuss this with her.   Recent Labs: 03/25/2017: Magnesium 2.0 05/12/2017: ALT 71; BUN <5; Creatinine, Ser 0.47; Hemoglobin 11.8; Platelets 256; Potassium 3.3; Sodium 136  Recent Lipid Panel    Component Value Date/Time   CHOL 406 (H) 03/23/2017 1456   TRIG 309 (H) 03/23/2017 1456   HDL 10 (L) 03/23/2017 1456   CHOLHDL 40.6 03/23/2017 1456   VLDL 62 (H) 03/23/2017 1456   LDLCALC 334 (H) 03/23/2017 1456    Physical Exam:    VS:  BP 122/80 (BP Location: Left Arm, Patient Position: Sitting, Cuff Size: Normal)   Pulse 82   Ht 5\' 2"  (1.575 m)   Wt 145 lb 6.4 oz (66 kg)   SpO2 98%   BMI 26.59 kg/m     Wt Readings from Last 3 Encounters:  05/26/17 145 lb 6.4 oz (66 kg)  05/04/17 148 lb 9.6 oz (67.4 kg)  04/20/17 152 lb 6.4 oz (69.1 kg)     GEN: Patient is in no acute distress HEENT: Normal NECK: No JVD; No carotid bruits LYMPHATICS: No lymphadenopathy CARDIAC: Hear sounds regular, 2/6 systolic murmur at the apex. RESPIRATORY:  Clear to auscultation without rales, wheezing or rhonchi  ABDOMEN: Soft, non-tender, non-distended MUSCULOSKELETAL:  No edema; No deformity  SKIN: Warm and  dry NEUROLOGIC:  Alert and oriented x 3 PSYCHIATRIC:  Normal affect   Signed, [name], MD  05/26/2017 9:33 AM    [facility]

## 2017-05-27 DIAGNOSIS — Z951 Presence of aortocoronary bypass graft: Secondary | ICD-10-CM | POA: Diagnosis not present

## 2017-05-27 DIAGNOSIS — Z48812 Encounter for surgical aftercare following surgery on the circulatory system: Secondary | ICD-10-CM | POA: Diagnosis not present

## 2017-06-01 DIAGNOSIS — Z48812 Encounter for surgical aftercare following surgery on the circulatory system: Secondary | ICD-10-CM | POA: Diagnosis not present

## 2017-06-01 DIAGNOSIS — N390 Urinary tract infection, site not specified: Secondary | ICD-10-CM | POA: Diagnosis not present

## 2017-06-01 DIAGNOSIS — Z951 Presence of aortocoronary bypass graft: Secondary | ICD-10-CM | POA: Diagnosis not present

## 2017-06-01 DIAGNOSIS — B9620 Unspecified Escherichia coli [E. coli] as the cause of diseases classified elsewhere: Secondary | ICD-10-CM | POA: Diagnosis not present

## 2017-06-01 DIAGNOSIS — R300 Dysuria: Secondary | ICD-10-CM | POA: Diagnosis not present

## 2017-06-01 DIAGNOSIS — N3020 Other chronic cystitis without hematuria: Secondary | ICD-10-CM | POA: Diagnosis not present

## 2017-06-01 MED FILL — DOXYCYCLINE HYCLATE 100 MG: 100 | 7 days supply | Qty: 14 | Fill #0

## 2017-06-03 DIAGNOSIS — Z951 Presence of aortocoronary bypass graft: Secondary | ICD-10-CM | POA: Diagnosis not present

## 2017-06-03 DIAGNOSIS — Z48812 Encounter for surgical aftercare following surgery on the circulatory system: Secondary | ICD-10-CM | POA: Diagnosis not present

## 2017-06-04 DIAGNOSIS — Z951 Presence of aortocoronary bypass graft: Secondary | ICD-10-CM | POA: Diagnosis not present

## 2017-06-04 DIAGNOSIS — Z48812 Encounter for surgical aftercare following surgery on the circulatory system: Secondary | ICD-10-CM | POA: Diagnosis not present

## 2017-06-08 DIAGNOSIS — Z48812 Encounter for surgical aftercare following surgery on the circulatory system: Secondary | ICD-10-CM | POA: Diagnosis not present

## 2017-06-08 DIAGNOSIS — Z951 Presence of aortocoronary bypass graft: Secondary | ICD-10-CM | POA: Diagnosis not present

## 2017-06-09 DIAGNOSIS — K743 Primary biliary cirrhosis: Secondary | ICD-10-CM | POA: Diagnosis not present

## 2017-06-10 DIAGNOSIS — Z48812 Encounter for surgical aftercare following surgery on the circulatory system: Secondary | ICD-10-CM | POA: Diagnosis not present

## 2017-06-10 DIAGNOSIS — Z951 Presence of aortocoronary bypass graft: Secondary | ICD-10-CM | POA: Diagnosis not present

## 2017-06-11 DIAGNOSIS — Z48812 Encounter for surgical aftercare following surgery on the circulatory system: Secondary | ICD-10-CM | POA: Diagnosis not present

## 2017-06-11 DIAGNOSIS — Z951 Presence of aortocoronary bypass graft: Secondary | ICD-10-CM | POA: Diagnosis not present

## 2017-06-15 DIAGNOSIS — Z951 Presence of aortocoronary bypass graft: Secondary | ICD-10-CM | POA: Diagnosis not present

## 2017-06-15 DIAGNOSIS — Z48812 Encounter for surgical aftercare following surgery on the circulatory system: Secondary | ICD-10-CM | POA: Diagnosis not present

## 2017-06-16 MED FILL — URSODIOL 300 MG CAPSULE: 300 | 90 days supply | Qty: 450 | Fill #0

## 2017-06-18 ENCOUNTER — Other Ambulatory Visit: Payer: Self-pay | Admitting: Gastroenterology

## 2017-06-18 ENCOUNTER — Ambulatory Visit
Admission: RE | Admit: 2017-06-18 | Discharge: 2017-06-18 | Disposition: A | Discharge status: Home / Self Care | Payer: 59 | Source: Ambulatory Visit | Attending: Gastroenterology | Admitting: Gastroenterology

## 2017-06-18 DIAGNOSIS — R0781 Pleurodynia: Secondary | ICD-10-CM | POA: Diagnosis not present

## 2017-06-18 DIAGNOSIS — K743 Primary biliary cirrhosis: Secondary | ICD-10-CM | POA: Diagnosis not present

## 2017-06-18 MED FILL — CEPHALEXIN 250 MG CAPS: 250 | 30 days supply | Qty: 30 | Fill #0

## 2017-06-22 DIAGNOSIS — Z48812 Encounter for surgical aftercare following surgery on the circulatory system: Secondary | ICD-10-CM | POA: Diagnosis not present

## 2017-06-22 DIAGNOSIS — Z951 Presence of aortocoronary bypass graft: Secondary | ICD-10-CM | POA: Diagnosis not present

## 2017-06-24 DIAGNOSIS — Z951 Presence of aortocoronary bypass graft: Secondary | ICD-10-CM | POA: Diagnosis not present

## 2017-06-24 DIAGNOSIS — Z48812 Encounter for surgical aftercare following surgery on the circulatory system: Secondary | ICD-10-CM | POA: Diagnosis not present

## 2017-06-25 DIAGNOSIS — Z951 Presence of aortocoronary bypass graft: Secondary | ICD-10-CM | POA: Diagnosis not present

## 2017-06-25 DIAGNOSIS — Z48812 Encounter for surgical aftercare following surgery on the circulatory system: Secondary | ICD-10-CM | POA: Diagnosis not present

## 2017-06-29 DIAGNOSIS — Z951 Presence of aortocoronary bypass graft: Secondary | ICD-10-CM | POA: Diagnosis not present

## 2017-06-29 DIAGNOSIS — Z48812 Encounter for surgical aftercare following surgery on the circulatory system: Secondary | ICD-10-CM | POA: Diagnosis not present

## 2017-07-02 DIAGNOSIS — Z951 Presence of aortocoronary bypass graft: Secondary | ICD-10-CM | POA: Diagnosis not present

## 2017-07-02 DIAGNOSIS — Z48812 Encounter for surgical aftercare following surgery on the circulatory system: Secondary | ICD-10-CM | POA: Diagnosis not present

## 2017-07-08 DIAGNOSIS — Z951 Presence of aortocoronary bypass graft: Secondary | ICD-10-CM | POA: Diagnosis not present

## 2017-07-08 DIAGNOSIS — Z48812 Encounter for surgical aftercare following surgery on the circulatory system: Secondary | ICD-10-CM | POA: Diagnosis not present

## 2017-07-09 DIAGNOSIS — Z48812 Encounter for surgical aftercare following surgery on the circulatory system: Secondary | ICD-10-CM | POA: Diagnosis not present

## 2017-07-09 DIAGNOSIS — Z951 Presence of aortocoronary bypass graft: Secondary | ICD-10-CM | POA: Diagnosis not present

## 2017-07-15 DIAGNOSIS — Z6825 Body mass index (BMI) 25.0-25.9, adult: Secondary | ICD-10-CM | POA: Diagnosis not present

## 2017-07-15 DIAGNOSIS — Z01419 Encounter for gynecological examination (general) (routine) without abnormal findings: Secondary | ICD-10-CM | POA: Diagnosis not present

## 2017-07-15 DIAGNOSIS — Z800 Family history of malignant neoplasm of digestive organs: Secondary | ICD-10-CM | POA: Diagnosis not present

## 2017-07-15 DIAGNOSIS — Z809 Family history of malignant neoplasm, unspecified: Secondary | ICD-10-CM | POA: Diagnosis not present

## 2017-07-15 MED FILL — TEMAZEPAM 30 MG CAPSULE: 30 | 30 days supply | Qty: 30 | Fill #0

## 2017-07-22 DIAGNOSIS — Z48812 Encounter for surgical aftercare following surgery on the circulatory system: Secondary | ICD-10-CM | POA: Diagnosis not present

## 2017-07-22 DIAGNOSIS — Z951 Presence of aortocoronary bypass graft: Secondary | ICD-10-CM | POA: Diagnosis not present

## 2017-07-23 DIAGNOSIS — E785 Hyperlipidemia, unspecified: Secondary | ICD-10-CM | POA: Diagnosis not present

## 2017-07-23 DIAGNOSIS — K743 Primary biliary cirrhosis: Secondary | ICD-10-CM | POA: Diagnosis not present

## 2017-07-23 DIAGNOSIS — Z0000 Encounter for general adult medical examination without abnormal findings: Secondary | ICD-10-CM | POA: Diagnosis not present

## 2017-07-30 DIAGNOSIS — K743 Primary biliary cirrhosis: Secondary | ICD-10-CM | POA: Diagnosis not present

## 2017-07-30 MED FILL — hydrOXYzine HCL 25 MG TABS: 25 | 90 days supply | Qty: 360 | Fill #0

## 2017-08-17 ENCOUNTER — Other Ambulatory Visit: Payer: Self-pay

## 2017-08-17 ENCOUNTER — Telehealth: Payer: Self-pay | Admitting: Cardiology

## 2017-08-17 MED ORDER — METOPROLOL TARTRATE 25 MG PO TABS: 12.5000 mg | ORAL_TABLET | Freq: Two times a day (BID) | ORAL | 1 refills | Status: DC

## 2017-08-17 MED FILL — METOPROLOL TARTRATE 25 MG T: 25 | 90 days supply | Qty: 90 | Fill #0

## 2017-08-17 NOTE — Telephone Encounter (Signed)
Please fill

## 2017-08-17 NOTE — Telephone Encounter (Signed)
°*  STAT* If patient is at the pharmacy, call can be transferred to refill team.   1. Which medications need to be refilled? (please list name of each medication and dose if known) metoprolol  2. Which pharmacy/location (including street and city if local pharmacy) is medication to be sent to?[facility]  3. Do they need a 30 day or 90 day supply? 90  Patient is at pharmacy waiting and prescription is not signed

## 2017-08-17 NOTE — Telephone Encounter (Signed)
Refill was sent

## 2017-08-20 ENCOUNTER — Telehealth: Payer: Self-pay | Admitting: Cardiology

## 2017-08-22 ENCOUNTER — Encounter: Payer: Self-pay | Admitting: Cardiology

## 2017-08-23 ENCOUNTER — Emergency Department (HOSPITAL_BASED_OUTPATIENT_CLINIC_OR_DEPARTMENT_OTHER): Payer: 59

## 2017-08-23 ENCOUNTER — Other Ambulatory Visit: Payer: Self-pay

## 2017-08-23 ENCOUNTER — Encounter (HOSPITAL_BASED_OUTPATIENT_CLINIC_OR_DEPARTMENT_OTHER): Payer: Self-pay | Admitting: Emergency Medicine

## 2017-08-23 ENCOUNTER — Emergency Department (HOSPITAL_BASED_OUTPATIENT_CLINIC_OR_DEPARTMENT_OTHER)
Admission: EM | Admit: 2017-08-23 | Discharge: 2017-08-24 | Disposition: A | Discharge status: Home / Self Care | Payer: 59 | Attending: Emergency Medicine | Admitting: Emergency Medicine

## 2017-08-23 DIAGNOSIS — R635 Abnormal weight gain: Secondary | ICD-10-CM | POA: Diagnosis not present

## 2017-08-23 DIAGNOSIS — R2243 Localized swelling, mass and lump, lower limb, bilateral: Secondary | ICD-10-CM | POA: Diagnosis present

## 2017-08-23 DIAGNOSIS — R609 Edema, unspecified: Secondary | ICD-10-CM

## 2017-08-23 DIAGNOSIS — Z7982 Long term (current) use of aspirin: Secondary | ICD-10-CM | POA: Insufficient documentation

## 2017-08-23 DIAGNOSIS — R0789 Other chest pain: Secondary | ICD-10-CM | POA: Insufficient documentation

## 2017-08-23 DIAGNOSIS — R0602 Shortness of breath: Secondary | ICD-10-CM | POA: Diagnosis not present

## 2017-08-23 DIAGNOSIS — Z951 Presence of aortocoronary bypass graft: Secondary | ICD-10-CM | POA: Insufficient documentation

## 2017-08-23 DIAGNOSIS — Z79899 Other long term (current) drug therapy: Secondary | ICD-10-CM | POA: Diagnosis not present

## 2017-08-23 DIAGNOSIS — I2510 Atherosclerotic heart disease of native coronary artery without angina pectoris: Secondary | ICD-10-CM | POA: Diagnosis not present

## 2017-08-23 DIAGNOSIS — R0989 Other specified symptoms and signs involving the circulatory and respiratory systems: Secondary | ICD-10-CM | POA: Diagnosis not present

## 2017-08-23 DIAGNOSIS — R600 Localized edema: Secondary | ICD-10-CM | POA: Diagnosis not present

## 2017-08-23 DIAGNOSIS — J9811 Atelectasis: Secondary | ICD-10-CM | POA: Diagnosis not present

## 2017-08-23 LAB — CBC WITH DIFFERENTIAL/PLATELET
BASOS PCT: 0 %
Basophils Absolute: 0 10*3/uL (ref 0.0–0.1)
EOS ABS: 0.2 10*3/uL (ref 0.0–0.7)
EOS PCT: 3 %
HCT: 31.8 % — ABNORMAL LOW (ref 36.0–46.0)
Hemoglobin: 11.1 g/dL — ABNORMAL LOW (ref 12.0–15.0)
Lymphocytes Relative: 20 %
Lymphs Abs: 1.1 10*3/uL (ref 0.7–4.0)
MCH: 33.2 pg (ref 26.0–34.0)
MCHC: 34.9 g/dL (ref 30.0–36.0)
MCV: 95.2 fL (ref 78.0–100.0)
Monocytes Absolute: 0.6 10*3/uL (ref 0.1–1.0)
Monocytes Relative: 12 %
Neutro Abs: 3.5 10*3/uL (ref 1.7–7.7)
Neutrophils Relative %: 65 %
PLATELETS: 158 10*3/uL (ref 150–400)
RBC: 3.34 MIL/uL — AB (ref 3.87–5.11)
RDW: 15.7 % — ABNORMAL HIGH (ref 11.5–15.5)
WBC: 5.4 10*3/uL (ref 4.0–10.5)

## 2017-08-23 LAB — COMPREHENSIVE METABOLIC PANEL
ALK PHOS: 323 U/L — AB (ref 38–126)
ALT: 47 U/L — AB (ref 0–44)
AST: 88 U/L — ABNORMAL HIGH (ref 15–41)
Albumin: 2.3 g/dL — ABNORMAL LOW (ref 3.5–5.0)
Anion gap: 8 (ref 5–15)
BILIRUBIN TOTAL: 10.7 mg/dL — AB (ref 0.3–1.2)
BUN: 8 mg/dL (ref 6–20)
CALCIUM: 8.5 mg/dL — AB (ref 8.9–10.3)
CO2: 24 mmol/L (ref 22–32)
CREATININE: 0.38 mg/dL — AB (ref 0.44–1.00)
Chloride: 104 mmol/L (ref 98–111)
Glucose, Bld: 93 mg/dL (ref 70–99)
Potassium: 3.6 mmol/L (ref 3.5–5.1)
Sodium: 136 mmol/L (ref 135–145)
Total Protein: 7.4 g/dL (ref 6.5–8.1)

## 2017-08-23 NOTE — ED Triage Notes (Signed)
Patient states that she had a CABG x 2 January 29th - Patient states that she has had increased swelling and weight gain since Monday - the patient reports that she is having some chest tightness and SOB

## 2017-08-24 DIAGNOSIS — R635 Abnormal weight gain: Secondary | ICD-10-CM | POA: Diagnosis not present

## 2017-08-24 DIAGNOSIS — Z79899 Other long term (current) drug therapy: Secondary | ICD-10-CM | POA: Diagnosis not present

## 2017-08-24 DIAGNOSIS — R600 Localized edema: Secondary | ICD-10-CM | POA: Diagnosis not present

## 2017-08-24 DIAGNOSIS — I2510 Atherosclerotic heart disease of native coronary artery without angina pectoris: Secondary | ICD-10-CM | POA: Diagnosis not present

## 2017-08-24 DIAGNOSIS — Z951 Presence of aortocoronary bypass graft: Secondary | ICD-10-CM | POA: Diagnosis not present

## 2017-08-24 DIAGNOSIS — R0989 Other specified symptoms and signs involving the circulatory and respiratory systems: Secondary | ICD-10-CM | POA: Diagnosis not present

## 2017-08-24 DIAGNOSIS — R0789 Other chest pain: Secondary | ICD-10-CM | POA: Diagnosis not present

## 2017-08-24 DIAGNOSIS — Z7982 Long term (current) use of aspirin: Secondary | ICD-10-CM | POA: Diagnosis not present

## 2017-08-24 LAB — TROPONIN I

## 2017-08-24 LAB — BRAIN NATRIURETIC PEPTIDE: B Natriuretic Peptide: 373.4 pg/mL — ABNORMAL HIGH (ref 0.0–100.0)

## 2017-08-24 MED ORDER — FUROSEMIDE 10 MG/ML IJ SOLN
40.0000 mg | Freq: Once | INTRAMUSCULAR | Status: DC
  Filled 2017-08-24: qty 4

## 2017-08-24 MED ORDER — FUROSEMIDE 10 MG/ML IJ SOLN
40.0000 mg | Freq: Once | INTRAMUSCULAR | Status: AC
  Administered 2017-08-24: 40 mg via INTRAMUSCULAR

## 2017-08-24 MED ORDER — FUROSEMIDE 20 MG PO TABS: 20.0000 mg | ORAL_TABLET | Freq: Every day | ORAL | 0 refills | Status: DC

## 2017-08-24 MED FILL — FUROSEMIDE 20 MG TABS: 20 | 30 days supply | Qty: 30 | Fill #0

## 2017-08-24 MED FILL — SPIRONOLACTONE 50 MG TABS: 50 | 30 days supply | Qty: 30 | Fill #0

## 2017-08-24 NOTE — ED Provider Notes (Signed)
[facility] EMERGENCY DEPARTMENT Provider Note   CSN: [id number] Arrival date & time: 08/23/17  2150     History   Chief Complaint Chief Complaint  Patient presents with  . Leg Swelling    HPI Christine Butler is a 47 y.o. female.  HPI  This is a 47 year old female with a history of primary biliary cholangitis and cirrhosis, IBS, coronary artery disease status post CABG who presents with lower extremity swelling and shortness of breath.  Patient reports over the last several days she has noted increasing lower extremity swelling.  She states she has had a 10 pound weight gain.  She has noticed that the swelling has increased to her abdomen.  She also over the last 1 to 2 days has noted increased dyspnea on exertion especially when going up steps and orthopnea.  She is sleeping in a recliner.  She states that she occasionally feels chest tightness when she lays flat.  She denies any exertional chest symptoms.  Of note, patient had a CABG in January.  At that time her echocardiogram showed an EF of 50 to 55%.  She does not at baseline take any diuretics.  She does report being recently started on a new liver medication to decrease her bilirubin.  She denies any recent fevers, cough.  Past Medical History:  Diagnosis Date  . Arthritis    right knee  . GERD (gastroesophageal reflux disease)   . IBS (irritable bowel syndrome)   . PONV (postoperative nausea and vomiting)   . Primary biliary cholangitis ([place])   . Primary biliary cirrhosis ([place])   . SVD (spontaneous vaginal delivery)    x 3  . Urinary tract bacterial infections     Patient Active Problem List   Diagnosis Date Noted  . Acute blood loss anemia 04/20/2017  . Elevated LFTs 04/20/2017  . Coronary artery disease 03/24/2017  . S/P CABG x 2 03/24/2017  . CAD (coronary artery disease), native coronary artery 03/23/2017  . Primary biliary cholangitis ([place]) 03/16/2017  . GERD (gastroesophageal reflux disease)  01/28/2012  . IBS (irritable bowel syndrome) 01/28/2012    Past Surgical History:  Procedure Laterality Date  . CORONARY ARTERY BYPASS GRAFT N/A 03/24/2017   Procedure: CORONARY ARTERY BYPASS GRAFTING (CABG) x two, using left internal mammary artery, left radial artery, and right leg greater saphenous vein harvested endoscopically;  Surgeon: Ivin Poot, MD;  Location: [facility];  Service: Open Heart Surgery;  Laterality: N/A;  . ENDOVEIN HARVEST OF GREATER SAPHENOUS VEIN Right 03/24/2017   Procedure: ENDOVEIN HARVEST OF GREATER SAPHENOUS VEIN;  Surgeon: Ivin Poot, MD;  Location: [facility];  Service: Open Heart Surgery;  Laterality: Right;  . INCONTINENCE SURGERY    . LEEP N/A 03/28/2014   Procedure: LOOP ELECTROSURGICAL EXCISION PROCEDURE (LEEP) cone biopsy;  Surgeon: Cyril Mourning, MD;  Location: [facility];  Service: Gynecology;  Laterality: N/A;  . LEFT HEART CATH AND CORONARY ANGIOGRAPHY N/A 03/23/2017   Procedure: LEFT HEART CATH AND CORONARY ANGIOGRAPHY;  Surgeon: Jettie Booze, MD;  Location: [facility];  Service: Cardiovascular;  Laterality: N/A;  . LIVER BIOPSY     x 2  . RADIAL ARTERY HARVEST Left 03/24/2017   Procedure: RADIAL ARTERY HARVEST;  Surgeon: Ivin Poot, MD;  Location: [facility];  Service: Open Heart Surgery;  Laterality: Left;  . TEE WITHOUT CARDIOVERSION N/A 03/24/2017   Procedure: TRANSESOPHAGEAL ECHOCARDIOGRAM (TEE);  Surgeon: Prescott Gum, Collier Salina, MD;  Location: [facility];  Service: Open Heart Surgery;  Laterality: N/A;  . WISDOM TOOTH EXTRACTION       OB History    Gravida  3   Para  3   Term      Preterm      AB      Living        SAB      TAB      Ectopic      Multiple      Live Births           Obstetric Comments  None         Home Medications    Prior to Admission medications   Medication Sig Start Date End Date Taking? Authorizing Provider  aspirin EC 81 MG tablet Take 1 tablet (81 mg total) by mouth daily. 05/04/17    Barrett, Erin R, PA-C  Calcium Carbonate-Vitamin D (CALCIUM + D PO) Take 1 tablet by mouth 2 (two) times a week.     [provider]  cholecalciferol (VITAMIN D) 1000 units tablet Take 2,000 Units by mouth 2 (two) times a week.     [provider]  cycloSPORINE (RESTASIS) 0.05 % ophthalmic emulsion Place 1 drop into both eyes daily.    [provider]  diphenhydrAMINE (BENADRYL) 25 mg capsule Take 50 mg by mouth at bedtime as needed for itching.    [provider]  furosemide (LASIX) 20 MG tablet Take 1 tablet (20 mg total) by mouth daily for 4 days. 08/24/17 08/28/17  Loralai Eisman, Barbette Hair, MD  isosorbide mononitrate (IMDUR) 30 MG 24 hr tablet Take 0.5 tablets (15 mg total) by mouth daily. 04/03/17   Elgie Collard, PA-C  Melatonin 3 MG TABS Take 6 mg by mouth at bedtime.    [provider]  metoprolol tartrate (LOPRESSOR) 25 MG tablet Take 0.5 tablets (12.5 mg total) by mouth 2 (two) times daily. 08/17/17   Revankar, Reita Cliche, MD  Multiple Vitamin (MULTIVITAMIN WITH MINERALS) TABS Take 1 tablet by mouth 2 (two) times a week.     [provider]  nitroGLYCERIN (NITROSTAT) 0.4 MG SL tablet Place 1 tablet (0.4 mg total) under the tongue every 5 (five) minutes as needed for chest pain. 05/12/17 08/10/17  Ward, Ozella Almond, PA-C  omeprazole (PRILOSEC) 10 MG capsule Take 20 mg by mouth as needed.     [provider]  Probiotic Product (PROBIOTIC PO) Take 1 tablet by mouth daily.    [provider]  ursodiol (ACTIGALL) 300 MG capsule Take 600-900 mg by mouth 2 (two) times daily. 900 mg in am,  600 mg in pm    [provider]    Family History Family History  Problem Relation Age of Onset  . CAD Father   . Diabetes Mellitus II Father     Social History Social History   Tobacco Use  . Smoking status: Never Smoker  . Smokeless tobacco: Never Used  Substance Use Topics  . Alcohol use: Yes    Alcohol/week: 0.6 oz    Types: 1  Glasses of wine per week    Comment: for dinner  . Drug use: No     Allergies   Codeine; Erythromycin; and Fentanyl   Review of Systems Review of Systems  Constitutional: Negative for fever.  Respiratory: Positive for chest tightness and shortness of breath. Negative for cough.   Cardiovascular: Positive for leg swelling. Negative for chest pain and palpitations.  Gastrointestinal: Negative for abdominal pain,  nausea and vomiting.  Genitourinary: Negative for difficulty urinating.  Skin: Negative for color change.  Neurological: Negative for dizziness.  All other systems reviewed and are negative.    Physical Exam Updated Vital Signs BP 113/70   Pulse 70   Temp 98.2 F (36.8 C) (Oral)   Resp 17   Ht 5\' 2"  (1.575 m)   Wt 68 kg (150 lb)   LMP 07/27/2017   SpO2 100%   BMI 27.44 kg/m    Physical Exam  Constitutional: She is oriented to person, place, and time.  Tonically ill-appearing, jaundice  HENT:  Head: Normocephalic and atraumatic.  Scleral icterus noticed  Eyes: Pupils are equal, round, and reactive to light.  Neck: Neck supple.  Cardiovascular: Normal rate, regular rhythm and normal heart sounds.  No murmur heard. Pulmonary/Chest: Effort normal and breath sounds normal. No respiratory distress. She has no wheezes.  Abdominal: Soft. Bowel sounds are normal. There is no tenderness.  Musculoskeletal: She exhibits edema.  1+ bilateral pitting edema  Neurological: She is alert and oriented to person, place, and time.  Skin: Skin is warm and dry.  Psychiatric: She has a normal mood and affect.  Nursing note and vitals reviewed.    ED Treatments / Results  Labs (all labs ordered are listed, but only abnormal results are displayed) Labs Reviewed  COMPREHENSIVE METABOLIC PANEL - Abnormal; Notable for the following components:      Result Value   Creatinine, Ser 0.38 (*)    Calcium 8.5 (*)    Albumin 2.3 (*)    AST 88 (*)    ALT 47 (*)    Alkaline  Phosphatase 323 (*)    Total Bilirubin 10.7 (*)    All other components within normal limits  CBC WITH DIFFERENTIAL/PLATELET - Abnormal; Notable for the following components:   RBC 3.34 (*)    Hemoglobin 11.1 (*)    HCT 31.8 (*)    RDW 15.7 (*)    All other components within normal limits  BRAIN NATRIURETIC PEPTIDE - Abnormal; Notable for the following components:   B Natriuretic Peptide 373.4 (*)    All other components within normal limits  TROPONIN I    EKG EKG Interpretation  Date/Time:  Sunday August 23 2017 21:58:17 EDT Ventricular Rate:  70 PR Interval:  138 QRS Duration: 86 QT Interval:  414 QTC Calculation: 447 R Axis:   7 Text Interpretation:  Normal sinus rhythm Low voltage QRS Nonspecific ST abnormality Abnormal QRS-T angle, consider primary T wave abnormality Abnormal ECG since last tracing no significant change Confirmed by Malvin Johns [phone]) on 08/23/2017 10:20:12 PM   Radiology Dg Chest 2 View  Result Date: 08/23/2017 CLINICAL DATA:  Increased swelling and weight gain EXAM: CHEST - 2 VIEW COMPARISON:  06/18/2016, 05/12/2017 FINDINGS: Post sternotomy changes. No significant pleural effusion. Stable cardiomegaly with mild central congestion. Subsegmental atelectasis at the right base. No pneumothorax. IMPRESSION: 1. Cardiomegaly with mild central vascular congestion 2. Subsegmental atelectasis at the right base Electronically Signed   By: Donavan Foil M.D.   On: 08/23/2017 22:53    Procedures Procedures (including critical care time)  Medications Ordered in ED Medications  furosemide (LASIX) injection 40 mg (40 mg Intramuscular Given 08/24/17 0046)     Initial Impression / Assessment and Plan / ED Course  I have reviewed the triage vital signs and the nursing notes.  Pertinent labs & imaging results that were available during my care of the patient were reviewed by me  and considered in my medical decision making (see chart for details).     Patient  presents with peripheral edema and shortness of breath.  She is overall nontoxic-appearing.  No acute respiratory distress.  Satting 100% on room air.  I have reviewed her chart.  She does have a mildly reduced EF based on her last echocardiogram as well as cardiac history.  EKG shows no evidence of ischemia.  Chest x-ray shows mild vascular congestion but no pleural effusions or overt pulmonary edema.  Pulmonary exam is reassuring.  She does have some evidence of peripheral edema and reports a 10 pound weight gain.  Her lab work is at baseline with a normal creatinine.  Suspect patient's volume overload is likely a combination of her liver disease and reduced EF.  Albumin is 2.3.  Patient was given 1 dose of IV Lasix here.  Given that she is in no respiratory distress and appears early in the process, I do feel she is appropriate for discharge home.  Will discharge with 20 mg Lasix for the next 4 days.  She was instructed to follow-up very closely with her cardiologist and her GI doctor as she will have a tendency to have big fluids shifts given her known disease.  She was given strict return precautions.  After history, exam, and medical workup I feel the patient has been appropriately medically screened and is safe for discharge home. Pertinent diagnoses were discussed with the patient. Patient was given return precautions.   Final Clinical Impressions(s) / ED Diagnoses   Final diagnoses:  Peripheral edema  Pulmonary vascular congestion    ED Discharge Orders        Ordered    furosemide (LASIX) 20 MG tablet  Daily     08/24/17 0055      Merryl Hacker, MD 08/24/17 0101

## 2017-08-24 NOTE — Discharge Instructions (Addendum)
You were seen today for leg swelling and shortness of breath.  This is likely related to combination of your known heart disease as well as her liver disease.  You will be trialed on a short course of diuretic.  Follow-up very closely with your gastroenterologist and cardiologist as you will need repeat testing.  If you develop worsening shortness of breath, increasing weight gain, or any new or worsening symptoms you should be reevaluated immediately.

## 2017-08-26 ENCOUNTER — Other Ambulatory Visit: Payer: Self-pay | Admitting: Gastroenterology

## 2017-08-26 DIAGNOSIS — R140 Abdominal distension (gaseous): Secondary | ICD-10-CM

## 2017-09-01 DIAGNOSIS — Z809 Family history of malignant neoplasm, unspecified: Secondary | ICD-10-CM | POA: Diagnosis not present

## 2017-09-04 ENCOUNTER — Other Ambulatory Visit: Payer: 59

## 2017-09-07 ENCOUNTER — Ambulatory Visit
Admission: RE | Admit: 2017-09-07 | Discharge: 2017-09-07 | Disposition: A | Discharge status: Home / Self Care | Payer: 59 | Source: Ambulatory Visit | Attending: Gastroenterology | Admitting: Gastroenterology

## 2017-09-07 ENCOUNTER — Other Ambulatory Visit: Payer: Self-pay | Admitting: Gastroenterology

## 2017-09-07 DIAGNOSIS — R140 Abdominal distension (gaseous): Secondary | ICD-10-CM

## 2017-09-09 ENCOUNTER — Other Ambulatory Visit: Payer: Self-pay

## 2017-09-09 ENCOUNTER — Encounter: Payer: Self-pay | Admitting: Cardiothoracic Surgery

## 2017-09-09 ENCOUNTER — Ambulatory Visit (INDEPENDENT_AMBULATORY_CARE_PROVIDER_SITE_OTHER): Payer: 59 | Admitting: Cardiothoracic Surgery

## 2017-09-09 VITALS — BP 91/59 | HR 64 | Resp 16 | Ht 62.0 in | Wt 130.0 lb

## 2017-09-09 DIAGNOSIS — I25110 Atherosclerotic heart disease of native coronary artery with unstable angina pectoris: Secondary | ICD-10-CM

## 2017-09-09 DIAGNOSIS — Z951 Presence of aortocoronary bypass graft: Secondary | ICD-10-CM

## 2017-09-09 DIAGNOSIS — K743 Primary biliary cirrhosis: Secondary | ICD-10-CM | POA: Diagnosis not present

## 2017-09-09 DIAGNOSIS — R7989 Other specified abnormal findings of blood chemistry: Secondary | ICD-10-CM

## 2017-09-09 DIAGNOSIS — R945 Abnormal results of liver function studies: Secondary | ICD-10-CM

## 2017-09-09 NOTE — Progress Notes (Signed)
PCP is Patient, No Pcp Per Referring Provider is Jettie Booze, MD  Chief Complaint  Patient presents with  . Routine Post Op    s/p CABG X 2...03/24/17.Marland KitchenMarland KitchenTO ASSESS FOR PROGRESS    HPI: 58-month follow-up after urgent CABG x2 using left IMA and left radial artery graft.  Patient presented with non-STEMI, EF 50-55%.  She has done well.  She is on a heart healthy diet.  She exercises regularly.  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  The patient's chronic biliary cholangitis is stable and followed by GI.  Her bilirubin is 10.  Of concern her lipid panel is now worse with total cholesterol over 440.  She is unable to take a statin because of her liver disease.  We will ReachOut to her cardiologist Dr. Geraldo Pitter to consider non-statin alternatives to prevent recurrent CAD.  Around 1 July the patient was seen in the ED for weight gain edema and shortness of breath.  I reviewed the chest x-ray.  She had some mild interstitial edema without pleural effusion.  Ultrasound showed no ascites.  She was put on a diuretic by her GI doctor with good diuresis and 10 to 15 pounds weight loss.  No associated angina palpitations orthopnea.   Past Medical History:  Diagnosis Date  . Arthritis    right knee  . GERD (gastroesophageal reflux disease)   . IBS (irritable bowel syndrome)   . PONV (postoperative nausea and vomiting)   . Primary biliary cholangitis ([place])   . Primary biliary cirrhosis ([place])   . SVD (spontaneous vaginal delivery)    x 3  . Urinary tract bacterial infections     Past Surgical History:  Procedure Laterality Date  . CORONARY ARTERY BYPASS GRAFT N/A 03/24/2017   Procedure: CORONARY ARTERY BYPASS GRAFTING (CABG) x two, using left internal mammary artery, left radial artery, and right leg greater saphenous vein harvested endoscopically;  Surgeon: Ivin Poot, MD;  Location: [place];  Service: Open Heart Surgery;  Laterality: N/A;  . ENDOVEIN HARVEST OF GREATER  SAPHENOUS VEIN Right 03/24/2017   Procedure: ENDOVEIN HARVEST OF GREATER SAPHENOUS VEIN;  Surgeon: Ivin Poot, MD;  Location: [place];  Service: Open Heart Surgery;  Laterality: Right;  . INCONTINENCE SURGERY    . LEEP N/A 03/28/2014   Procedure: LOOP ELECTROSURGICAL EXCISION PROCEDURE (LEEP) cone biopsy;  Surgeon: Cyril Mourning, MD;  Location: [facility];  Service: Gynecology;  Laterality: N/A;  . LEFT HEART CATH AND CORONARY ANGIOGRAPHY N/A 03/23/2017   Procedure: LEFT HEART CATH AND CORONARY ANGIOGRAPHY;  Surgeon: Jettie Booze, MD;  Location: [facility];  Service: Cardiovascular;  Laterality: N/A;  . LIVER BIOPSY     x 2  . RADIAL ARTERY HARVEST Left 03/24/2017   Procedure: RADIAL ARTERY HARVEST;  Surgeon: Ivin Poot, MD;  Location: [facility];  Service: Open Heart Surgery;  Laterality: Left;  . TEE WITHOUT CARDIOVERSION N/A 03/24/2017   Procedure: TRANSESOPHAGEAL ECHOCARDIOGRAM (TEE);  Surgeon: Prescott Gum, Collier Salina, MD;  Location: [facility];  Service: Open Heart Surgery;  Laterality: N/A;  . WISDOM TOOTH EXTRACTION      Family History  Problem Relation Age of Onset  . CAD Father   . Diabetes Mellitus II Father     Social History Social History   Tobacco Use  . Smoking status: Never Smoker  . Smokeless tobacco: Never Used  Substance Use Topics  . Alcohol use: Yes    Alcohol/week: 0.6  oz    Types: 1 Glasses of wine per week    Comment: for dinner  . Drug use: No    Current Outpatient Medications  Medication Sig Dispense Refill  . aspirin EC 81 MG tablet Take 1 tablet (81 mg total) by mouth daily.    . Calcium Carbonate-Vitamin D (CALCIUM + D PO) Take 1 tablet by mouth 2 (two) times a week.     . cholecalciferol (VITAMIN D) 1000 units tablet Take 2,000 Units by mouth 2 (two) times a week.     . cycloSPORINE (RESTASIS) 0.05 % ophthalmic emulsion Place 1 drop into both eyes daily.    . diphenhydrAMINE (BENADRYL) 25 mg capsule Take 50 mg by mouth at bedtime as needed  for itching.    . hydrOXYzine (ATARAX/VISTARIL) 25 MG tablet Take 25 mg by mouth every 6 (six) hours.    . Melatonin 3 MG TABS Take 6 mg by mouth at bedtime.    . metoprolol tartrate (LOPRESSOR) 25 MG tablet Take 0.5 tablets (12.5 mg total) by mouth 2 (two) times daily. 90 tablet 1  . Multiple Vitamin (MULTIVITAMIN WITH MINERALS) TABS Take 1 tablet by mouth 2 (two) times a week.     . nitroGLYCERIN (NITROSTAT) 0.4 MG SL tablet Place 1 tablet (0.4 mg total) under the tongue every 5 (five) minutes as needed for chest pain. 25 tablet 6  . Obeticholic Acid 5 MG TABS Take 1 tablet by mouth daily.    Marland Kitchen omeprazole (PRILOSEC) 10 MG capsule Take 20 mg by mouth as needed.     . Probiotic Product (PROBIOTIC PO) Take 1 tablet by mouth daily.    . temazepam (RESTORIL) 30 MG capsule Take 30 mg by mouth at bedtime.    . ursodiol (ACTIGALL) 300 MG capsule Take 600-900 mg by mouth 2 (two) times daily. 900 mg in am,  600 mg in pm     No current facility-administered medications for this visit.     Allergies  Allergen Reactions  . Codeine Nausea And Vomiting  . Erythromycin Nausea And Vomiting  . Fentanyl Nausea And Vomiting    Review of Systems   Patient feels poorly after being placed on a new medication, ocaliva, for her liver disease.  She is to be evaluated by the transplant hepatologist at [facility] this summer.  BP (!) 91/59 (BP Location: Right Arm, Patient Position: Sitting, Cuff Size: Normal)   Pulse 64   Resp 16   Ht 5\' 2"  (1.575 m)   Wt 130 lb (59 kg)   SpO2 98% Comment: ON RA  BMI 23.78 kg/m  Physical Exam Alert and comfortable Sclera anicteric No JVD Lungs clear Sternal incision well-healed Regular rhythm, no murmur, positive S4 No ankle edema  Diagnostic Tests: Images of most recent chest x-ray taken in the ED personally reviewed and discussed with patient  Impression: Stable after CABG January 2019. Patient is concerned over her worsening lipid panel with total cholesterol  greater than 440.  We will recheck to Dr. Geraldo Pitter to consider non-statin alternatives.  She should also have a repeat echocardiogram with recent symptoms of diastolic heart failure.  Plan: Return for surgical follow-up late 2019.   Len Childs, MD [facility] [phone]

## 2017-09-10 ENCOUNTER — Other Ambulatory Visit: Payer: Self-pay | Admitting: *Deleted

## 2017-09-10 DIAGNOSIS — E785 Hyperlipidemia, unspecified: Secondary | ICD-10-CM

## 2017-09-10 DIAGNOSIS — Z951 Presence of aortocoronary bypass graft: Secondary | ICD-10-CM

## 2017-09-10 DIAGNOSIS — I5030 Unspecified diastolic (congestive) heart failure: Secondary | ICD-10-CM

## 2017-09-14 ENCOUNTER — Other Ambulatory Visit: Payer: Self-pay | Admitting: *Deleted

## 2017-09-14 DIAGNOSIS — I2510 Atherosclerotic heart disease of native coronary artery without angina pectoris: Secondary | ICD-10-CM

## 2017-09-14 DIAGNOSIS — Z951 Presence of aortocoronary bypass graft: Secondary | ICD-10-CM

## 2017-09-14 DIAGNOSIS — I25110 Atherosclerotic heart disease of native coronary artery with unstable angina pectoris: Secondary | ICD-10-CM

## 2017-09-18 ENCOUNTER — Ambulatory Visit (HOSPITAL_BASED_OUTPATIENT_CLINIC_OR_DEPARTMENT_OTHER): Payer: 59

## 2017-09-25 ENCOUNTER — Other Ambulatory Visit (HOSPITAL_COMMUNITY)
Admission: AD | Admit: 2017-09-25 | Discharge: 2017-09-25 | Disposition: A | Discharge status: Home / Self Care | Payer: 59 | Source: Ambulatory Visit | Attending: Gastroenterology | Admitting: Gastroenterology

## 2017-09-25 DIAGNOSIS — K743 Primary biliary cirrhosis: Secondary | ICD-10-CM | POA: Insufficient documentation

## 2017-09-25 LAB — HEPATIC FUNCTION PANEL
ALBUMIN: 2.3 g/dL — AB (ref 3.5–5.0)
ALT: 51 U/L — ABNORMAL HIGH (ref 0–44)
AST: 98 U/L — ABNORMAL HIGH (ref 15–41)
Alkaline Phosphatase: 314 U/L — ABNORMAL HIGH (ref 38–126)
BILIRUBIN INDIRECT: 4.1 mg/dL — AB (ref 0.3–0.9)
Bilirubin, Direct: 6 mg/dL — ABNORMAL HIGH (ref 0.0–0.2)
Total Bilirubin: 10.1 mg/dL — ABNORMAL HIGH (ref 0.3–1.2)
Total Protein: 7 g/dL (ref 6.5–8.1)

## 2017-09-25 LAB — APTT: aPTT: 29 seconds (ref 24–36)

## 2017-09-25 LAB — PROTIME-INR
INR: 1.11
PROTHROMBIN TIME: 14.2 s (ref 11.4–15.2)

## 2017-09-28 ENCOUNTER — Ambulatory Visit
Admit: 2017-09-28 | Discharge: 2017-09-29 | Payer: PRIVATE HEALTH INSURANCE | Attending: Gastroenterology | Primary: Gastroenterology

## 2017-09-28 DIAGNOSIS — R945 Abnormal results of liver function studies: Principal | ICD-10-CM

## 2017-09-30 ENCOUNTER — Ambulatory Visit (HOSPITAL_BASED_OUTPATIENT_CLINIC_OR_DEPARTMENT_OTHER)
Admission: RE | Admit: 2017-09-30 | Discharge: 2017-09-30 | Disposition: A | Discharge status: Home / Self Care | Payer: 59 | Source: Ambulatory Visit | Attending: Cardiology | Admitting: Cardiology

## 2017-09-30 DIAGNOSIS — I071 Rheumatic tricuspid insufficiency: Secondary | ICD-10-CM | POA: Insufficient documentation

## 2017-09-30 DIAGNOSIS — I2510 Atherosclerotic heart disease of native coronary artery without angina pectoris: Secondary | ICD-10-CM | POA: Diagnosis not present

## 2017-09-30 DIAGNOSIS — I25110 Atherosclerotic heart disease of native coronary artery with unstable angina pectoris: Secondary | ICD-10-CM | POA: Insufficient documentation

## 2017-09-30 DIAGNOSIS — Z951 Presence of aortocoronary bypass graft: Secondary | ICD-10-CM | POA: Insufficient documentation

## 2017-09-30 NOTE — Progress Notes (Signed)
  Echocardiogram 2D Echocardiogram has been performed.  Christine Butler 09/30/2017, 8:52 AM

## 2017-10-06 NOTE — Telephone Encounter (Signed)
error 

## 2017-10-07 DIAGNOSIS — K743 Primary biliary cirrhosis: Secondary | ICD-10-CM | POA: Diagnosis not present

## 2017-10-07 DIAGNOSIS — R945 Abnormal results of liver function studies: Secondary | ICD-10-CM | POA: Diagnosis not present

## 2017-10-12 ENCOUNTER — Ambulatory Visit (INDEPENDENT_AMBULATORY_CARE_PROVIDER_SITE_OTHER): Payer: 59 | Admitting: Internal Medicine

## 2017-10-12 ENCOUNTER — Encounter: Payer: Self-pay | Admitting: Internal Medicine

## 2017-10-12 VITALS — BP 116/60 | HR 82 | Ht 62.0 in | Wt 141.0 lb

## 2017-10-12 DIAGNOSIS — K743 Primary biliary cirrhosis: Secondary | ICD-10-CM

## 2017-10-12 DIAGNOSIS — E7849 Other hyperlipidemia: Secondary | ICD-10-CM | POA: Diagnosis not present

## 2017-10-12 DIAGNOSIS — E785 Hyperlipidemia, unspecified: Secondary | ICD-10-CM

## 2017-10-12 NOTE — Patient Instructions (Signed)
[email or web address]  You have been referred to Dr. Lattie Corns Your appointment will be at [address] physician recommends that you schedule a follow-up appointment in: TWO MONTHS with Dr. Debara Pickett

## 2017-10-12 NOTE — Progress Notes (Signed)
OFFICE CONSULT NOTE  Chief Complaint:  Management dyslipidemia  Primary Care Physician: Patient, No Pcp Per  HPI:  Christine Butler is a 47 y.o. female who is being seen today for the evaluation of dyslipidemia at the request of Dr. Prescott Gum / [place]. This is a pleasant 47 year old female who unfortunately underwent urgent coronary bypass surgery in January 2019 after complaining of symptoms concerning for unstable angina.  She was found to have two-vessel coronary disease and underwent LIMA to Diagonal and left free radial artery graft to the LAD with interposition at the proximal anastamosis).  Other medical problems include PBC, IBS and GERD.  She has recently been followed by Dr. [name], with hepatology and liver transplant service at [facility].  She has persistent jaundice with elevated liver enzymes.  There is also strong family history of heart disease.  Her father had two-vessel bypass at age 20.  She had a brother died of MI.  Her paternal grandfather and grandmother both had MIs in their 67s.  She also has an aunt who has multiple cardiac issues.  She does have one sister who has normal cholesterol.  In addition she has 3 children aged 57, 81 and 80, none of which you have had cholesterol testing.  Most recently her lipid profile showed a total cholesterol 449, HDL of 11, triglycerides 333 and LDL 371.  Obviously a very abnormal lipid profile concerning for HeFH.  PMHx:  Past Medical History:  Diagnosis Date  . Arthritis    right knee  . GERD (gastroesophageal reflux disease)   . IBS (irritable bowel syndrome)   . PONV (postoperative nausea and vomiting)   . Primary biliary cholangitis ([place])   . Primary biliary cirrhosis ([place])   . SVD (spontaneous vaginal delivery)    x 3  . Urinary tract bacterial infections     Past Surgical History:  Procedure Laterality Date  . CORONARY ARTERY BYPASS GRAFT N/A 03/24/2017   Procedure: CORONARY ARTERY BYPASS GRAFTING (CABG) x  two, using left internal mammary artery, left radial artery, and right leg greater saphenous vein harvested endoscopically;  Surgeon: Ivin Poot, MD;  Location: [place];  Service: Open Heart Surgery;  Laterality: N/A;  . ENDOVEIN HARVEST OF GREATER SAPHENOUS VEIN Right 03/24/2017   Procedure: ENDOVEIN HARVEST OF GREATER SAPHENOUS VEIN;  Surgeon: Ivin Poot, MD;  Location: [facility];  Service: Open Heart Surgery;  Laterality: Right;  . INCONTINENCE SURGERY    . LEEP N/A 03/28/2014   Procedure: LOOP ELECTROSURGICAL EXCISION PROCEDURE (LEEP) cone biopsy;  Surgeon: Cyril Mourning, MD;  Location: [facility];  Service: Gynecology;  Laterality: N/A;  . LEFT HEART CATH AND CORONARY ANGIOGRAPHY N/A 03/23/2017   Procedure: LEFT HEART CATH AND CORONARY ANGIOGRAPHY;  Surgeon: Jettie Booze, MD;  Location: [facility];  Service: Cardiovascular;  Laterality: N/A;  . LIVER BIOPSY     x 2  . RADIAL ARTERY HARVEST Left 03/24/2017   Procedure: RADIAL ARTERY HARVEST;  Surgeon: Ivin Poot, MD;  Location: [place];  Service: Open Heart Surgery;  Laterality: Left;  . TEE WITHOUT CARDIOVERSION N/A 03/24/2017   Procedure: TRANSESOPHAGEAL ECHOCARDIOGRAM (TEE);  Surgeon: Prescott Gum, Collier Salina, MD;  Location: [facility];  Service: Open Heart Surgery;  Laterality: N/A;  . WISDOM TOOTH EXTRACTION      FAMHx:  Family History  Problem Relation Age of Onset  . CAD Father   . Diabetes Mellitus II Father  SOCHx:   reports that she has never smoked. She has never used smokeless tobacco. She reports that she drinks about 1.0 standard drinks of alcohol per week. She reports that she does not use drugs.  ALLERGIES:  Allergies  Allergen Reactions  . Codeine Nausea And Vomiting  . Erythromycin Nausea And Vomiting  . Fentanyl Nausea And Vomiting    ROS: Pertinent items noted in HPI and remainder of comprehensive ROS otherwise negative.  HOME MEDS: Current Outpatient Medications on File Prior to Visit    Medication Sig Dispense Refill  . aspirin EC 81 MG tablet Take 1 tablet (81 mg total) by mouth daily.    . Calcium Carbonate-Vitamin D (CALCIUM + D PO) Take 1 tablet by mouth 2 (two) times a week.     . cholecalciferol (VITAMIN D) 1000 units tablet Take 2,000 Units by mouth 2 (two) times a week.     . cycloSPORINE (RESTASIS) 0.05 % ophthalmic emulsion Place 1 drop into both eyes daily.    . diphenhydrAMINE (BENADRYL) 25 mg capsule Take 50 mg by mouth at bedtime as needed for itching.    . hydrOXYzine (ATARAX/VISTARIL) 25 MG tablet Take 25 mg by mouth every 6 (six) hours.    . Melatonin 3 MG TABS Take 6 mg by mouth at bedtime.    . metoprolol tartrate (LOPRESSOR) 25 MG tablet Take 0.5 tablets (12.5 mg total) by mouth 2 (two) times daily. 90 tablet 1  . Multiple Vitamin (MULTIVITAMIN WITH MINERALS) TABS Take 1 tablet by mouth 2 (two) times a week.     Marland Kitchen omeprazole (PRILOSEC) 10 MG capsule Take 20 mg by mouth as needed.     . Probiotic Product (PROBIOTIC PO) Take 1 tablet by mouth daily.    . temazepam (RESTORIL) 30 MG capsule Take 30 mg by mouth at bedtime.    . ursodiol (ACTIGALL) 300 MG capsule Take 600-900 mg by mouth 2 (two) times daily. 900 mg in am,  600 mg in pm    . furosemide (LASIX) 20 MG tablet Take 20 mg by mouth as needed.    . nitroGLYCERIN (NITROSTAT) 0.4 MG SL tablet Place 1 tablet (0.4 mg total) under the tongue every 5 (five) minutes as needed for chest pain. 25 tablet 6  . spironolactone (ALDACTONE) 50 MG tablet Take 1 tablet by mouth as needed. For swelling  1   No current facility-administered medications on file prior to visit.     LABS/IMAGING: No results found for this or any previous visit (from the past 48 hour(s)). No results found.  LIPID PANEL:    Component Value Date/Time   CHOL 406 (H) 03/23/2017 1456   TRIG 309 (H) 03/23/2017 1456   HDL 10 (L) 03/23/2017 1456   CHOLHDL 40.6 03/23/2017 1456   VLDL 62 (H) 03/23/2017 1456   LDLCALC 334 (H) 03/23/2017 1456     WEIGHTS: Wt Readings from Last 3 Encounters:  10/12/17 141 lb (64 kg)  09/09/17 130 lb (59 kg)  08/23/17 150 lb (68 kg)    VITALS: BP 116/60 (BP Location: Right Arm, Patient Position: Sitting, Cuff Size: Normal)   Pulse 82   Ht 5\' 2"  (1.575 m)   Wt 141 lb (64 kg)   BMI 25.79 kg/m   EXAM: General appearance: alert, icteric and no distress Neck: no carotid bruit, no JVD and thyroid not enlarged, symmetric, no tenderness/mass/nodules Lungs: clear to auscultation bilaterally Heart: regular rate and rhythm, S1, S2 normal, no murmur, click, rub or gallop  Abdomen: soft, non-tender; bowel sounds normal; no masses,  no organomegaly and Distended liver Extremities: extremities normal, atraumatic, no cyanosis or edema Pulses: 2+ and symmetric Skin: Mild jaundice Neurologic: Grossly normal Psych: Pleasant  EKG: Deferred  ASSESSMENT: 1. 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 ASCVD with recent two-vessel CABG (02/2017) 3. Strong family history of premature coronary artery disease 4. PBC, with cirrhosis and ongoing jaundice (followed by Dr. [name] at [facility])  PLAN: 1.   Ms. Pirani likely has HeFH based on her significantly elevated cholesterol and possibly HoFH.  There is a strong family history of heart disease, mostly on her father's side, and she is a good candidate for genetic testing for FH.  This could be helpful in both management and cascade screening of her children.  Whether she proceed with this or not I would recommend that they have lipid profiles performed.  From a lipid neurologist standpoint, PBC is not necessarily absolutely contraindicated for statin therapy however her hepatologist has suggested not using any statins.  One option may be to use Pitavastatin which is predominantly glucuronidated and metabolized outside the liver - the other option is PCSK9 inhibitor, which also could cause liver enzyme elevation, but is likely safer.  I will reach out  to her hepatologist to see what route he would recommend at this point as she said that he is also working her up for possible liver transplant.  Thanks for allowing me to participate in her care.  Christine Casino, MD, [facility], [place] Director of the Advanced Lipid Disorders &  Cardiovascular Risk Reduction Clinic Diplomate of the American Board of Clinical Lipidology Attending Cardiologist  Direct Dial: [phone]  Fax: [phone]  Website:  [email or web address] 10/12/2017, 3:25 PM

## 2017-10-13 ENCOUNTER — Encounter: Payer: Self-pay | Admitting: Internal Medicine

## 2017-10-13 DIAGNOSIS — E785 Hyperlipidemia, unspecified: Secondary | ICD-10-CM

## 2017-10-13 HISTORY — DX: Hyperlipidemia, unspecified: E78.5

## 2017-10-15 ENCOUNTER — Encounter: Payer: Self-pay | Admitting: Internal Medicine

## 2017-10-15 ENCOUNTER — Ambulatory Visit: Payer: 59 | Admitting: Genetic Counselor

## 2017-10-20 NOTE — Progress Notes (Signed)
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 We walked through the risk factors that can lead to hypercholesterolemia and discussed the characteristic features of a genetic condition, namely absence of risk factors, early age of presentation, increased disease severity and family history of the condition. The clinical manifestations of FH were reviewed.   We went through the molecular pathogenesis of FH and I informed her that [place] is primarily caused by pathogenic variants in three genes, namely APOB, LDLR and PCSK9. Pathogenic variants in these genes impact LDLR synthesis, degradation and recycling in cells. We then walked through autosomal dominant inheritance pattern and viewed pedigree of families with heterozygous FH (HeFH) and homozygous FH (HoFH). I informed her that digenic or compound heterozygous mutations in APOB, LDLR and PCSK9 genes can cause HoFH.   We reviewed the likely outcomes of FH genetic testing. Based on the diagnostic criteria for FH, yields can range from 50%-90%. A positive yield is observed in  ~63% of patients with a definite clinical diagnosis of FH. I also made clear to her that a negative test does not exclude a genetic basis for FH. Limitations in current genetic testing methodology can produce a negative result. It can also be attributed to polygenic inheritance that is widely observed in [place]. Variants of unknown significance (VUS) can be seen in some cases. I explained to her that typically a VUS is so classified if the variant is not well understood as very few individuals have been reported to harbor this variant or its role in gene function has not been elucidated. Screening other first-degree family members by genetic testing was also discussed. Additionally, we briefly touched upon the molecular basis of the different treatment modalities that are currently available.  Her medical and 5-generation family history was  obtained. 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 She recollects having a normal lipid profile in 2010. Her most current lipid panel shows LDL-C of 371, total cholesterol of 449, HDL 11 and triglycerides 333.   She states that in the summer of 2018 she began feeling winded and thought she was out of shape. She tells me that in September 2018 she had a sudden onset of chest pains with both shoulders hurting. When the pain did not subside after 3 hours she went to the ER. Her EKG was abnormal and she was admitted overnight at the hospital. However, she had a normal echo, enzymes and nuclear imaging stress test. She was told that her symptoms were GI-related, likely esophageal spasms and was released the next day.  In early October she began experiencing pain in her clavicles. She says that this was predominantly while exerting herself and would resolve with rest. However, the clavicle pain began appearing even at rest and she finally sought a cardiologist to address this pain. She saw Dr. Lennox Pippins at [facility] who ordered a heart cath. This revealed 99% block in the LAD at a bifurcation that necessitated a bypass surgery. She mentions that her recovery was rough as she had bilateral pleural effusions and was on diuretics. She underwent cardiac rehab and states that she is better although she does experience fatigue.  Traditional Risk Factors Upon reviewing the risk factors that can also lead to elevated cholesterol, she informs me that she was diagnosed in 2006 with primary biliary cholangitis. This has progressively worsened and  her bilirubin levels are high. This also contributes to her fatigue and constant itching.  Family history [place] (III.3) is the youngest of three siblings. She has three children; 60 year old (IV.74), and 26 year old (IV.5) sons, and 15-year  old (IV.6) daughter. She states that they have not undergone testing of their lipid levels. Her oldest brother (III.1) died in his sleep at 3 months from a congenital heart defect. Her elder sister (III.2) has normal lipids. Her children (IV.1-IV.3) and grandchildren (V.1-V.2) are currently in good health.  Her mother (II.6) underwent a heart cath this year at age 52. Several arteries had ~ 60% blockage. She states that stents was not warranted and she is currently on statins and non-statin medications. She does report her maternal uncle (II.8) dying suddenly at 43. Previously he had aortic valve replacement and was awaiting surgery to replace the valve, when he died suddenly at home. There in no other history of heart disease in her maternal relatives.  Tziporah's father (II.5) is now 54. She states that he went to a cardiologist at age 45 because of his family history of sudden death. Heart cath revealed 80% blockage of the LAD and he underwent a double bypass at 65. Subsequently he suffered a "mild heart attack" at 67 and has two stents. His cholesterol is not controlled by medication.  There is significant history of CAD amongst his siblings and parents. Adaria states that, both her paternal grandparents died suddenly from a massive heart attack. Her paternal grandfather (I.1) was 2 and his wife (I.2) was in her 52s when they died. She reports her paternal uncle (II.1) also dying suddenly in his 21s from an acute MI. She recollects that he was at home and got up when he collapsed and died instantly. He has two daughters (not indicated on pedigree), one has Huntington's disease and the other is in good health. She also reports a paternal uncle (II.4) who suffered a heart attack and a paternal aunt (II.3) who has had several heart attacks and stents. She states that she has been "on death's door thrice" only to bounce back.   Impression and Plans  In summary, Sundae's clinical presentation, age of diagnosis  and significant family history of sudden death in her paternal lineage is indicative of a genetic condition. She reports several relatives dying suddenly, importantly her paternal grandmother and uncle in their 11s. Nevertheless, it is likely that her primary biliary cholangitis is contributing to her condition. Genetic testing for FH is highly recommended to determine if she has inherited her condition or if her liver disease is confounding her diagnosis. The genetic test should include APOB, LDLR and PCSK9 genes. Identifying the causative gene variant will help in directing appropriate treatment strategies to reduce her cholesterol levels and risk of an adverse event.  In addition, we discussed the protections afforded by the Genetic Information Non-Discrimination Act (GINA). I explained to her that GINA protects her from losing her employment or health insurance based on her genotype. However, these protections do not cover life insurance and disability. I recommended that she obtain appropriate life insurance coverage for her children to avoid future denials, if they test positive for the familial variant. She states that she has life insurance coverage but will have to check with the kid's dad about their life insurance. We also reviewed insurance coverage for genetic testing. The lab will help obtain prior authorization for the test.   Plan Marizol Capurro is interested in genetic testing for FH.  Blood was drawn today and sent out for testing.                                                                                                                                                                                                                                                            Lattie Corns, Ph.D, [facility] Clinical Molecular Geneticist

## 2017-10-27 MED FILL — URSODIOL 300 MG CAPSULE: 300 | 18 days supply | Qty: 90 | Fill #1

## 2017-10-27 MED FILL — FUROSEMIDE 20 MG TABS: 20 | 30 days supply | Qty: 30 | Fill #1

## 2017-10-27 MED FILL — TEMAZEPAM 30 MG CAPSULE: 30 | 30 days supply | Qty: 30 | Fill #1

## 2017-11-03 DIAGNOSIS — K743 Primary biliary cirrhosis: Secondary | ICD-10-CM | POA: Diagnosis not present

## 2017-11-03 DIAGNOSIS — R945 Abnormal results of liver function studies: Secondary | ICD-10-CM | POA: Diagnosis not present

## 2017-11-05 ENCOUNTER — Emergency Department (HOSPITAL_COMMUNITY): Payer: 59

## 2017-11-05 ENCOUNTER — Encounter (HOSPITAL_COMMUNITY): Payer: Self-pay | Admitting: Emergency Medicine

## 2017-11-05 ENCOUNTER — Observation Stay (HOSPITAL_COMMUNITY): Payer: 59

## 2017-11-05 ENCOUNTER — Other Ambulatory Visit: Payer: Self-pay

## 2017-11-05 ENCOUNTER — Emergency Department (INDEPENDENT_AMBULATORY_CARE_PROVIDER_SITE_OTHER)
Admission: EM | Admit: 2017-11-05 | Discharge: 2017-11-05 | Disposition: A | Discharge status: Other Acute Inpatient Hospital | Payer: 59 | Source: Home / Self Care | Attending: Family Medicine | Admitting: Family Medicine

## 2017-11-05 ENCOUNTER — Observation Stay (HOSPITAL_COMMUNITY)
Admission: EM | Admit: 2017-11-05 | Discharge: 2017-11-06 | Disposition: A | Discharge status: Home / Self Care | Payer: 59 | Attending: Internal Medicine | Admitting: Internal Medicine

## 2017-11-05 DIAGNOSIS — Z951 Presence of aortocoronary bypass graft: Secondary | ICD-10-CM | POA: Diagnosis not present

## 2017-11-05 DIAGNOSIS — R079 Chest pain, unspecified: Secondary | ICD-10-CM | POA: Diagnosis not present

## 2017-11-05 DIAGNOSIS — Z79899 Other long term (current) drug therapy: Secondary | ICD-10-CM | POA: Diagnosis not present

## 2017-11-05 DIAGNOSIS — R188 Other ascites: Secondary | ICD-10-CM | POA: Insufficient documentation

## 2017-11-05 DIAGNOSIS — I25110 Atherosclerotic heart disease of native coronary artery with unstable angina pectoris: Secondary | ICD-10-CM | POA: Diagnosis not present

## 2017-11-05 DIAGNOSIS — R0781 Pleurodynia: Secondary | ICD-10-CM | POA: Diagnosis not present

## 2017-11-05 DIAGNOSIS — K743 Primary biliary cirrhosis: Secondary | ICD-10-CM | POA: Insufficient documentation

## 2017-11-05 DIAGNOSIS — R0789 Other chest pain: Principal | ICD-10-CM | POA: Insufficient documentation

## 2017-11-05 DIAGNOSIS — Z7982 Long term (current) use of aspirin: Secondary | ICD-10-CM | POA: Insufficient documentation

## 2017-11-05 DIAGNOSIS — I2510 Atherosclerotic heart disease of native coronary artery without angina pectoris: Secondary | ICD-10-CM | POA: Diagnosis not present

## 2017-11-05 DIAGNOSIS — R9431 Abnormal electrocardiogram [ECG] [EKG]: Secondary | ICD-10-CM

## 2017-11-05 HISTORY — DX: Presence of aortocoronary bypass graft: Z95.1

## 2017-11-05 HISTORY — DX: Other hyperlipidemia: E78.49

## 2017-11-05 HISTORY — DX: Other ascites: R18.8

## 2017-11-05 HISTORY — DX: Other chest pain: R07.89

## 2017-11-05 HISTORY — DX: Atherosclerotic heart disease of native coronary artery without angina pectoris: I25.10

## 2017-11-05 LAB — CBC
HEMATOCRIT: 34 % — AB (ref 36.0–46.0)
Hemoglobin: 11 g/dL — ABNORMAL LOW (ref 12.0–15.0)
MCH: 32.6 pg (ref 26.0–34.0)
MCHC: 32.4 g/dL (ref 30.0–36.0)
MCV: 100.9 fL — ABNORMAL HIGH (ref 78.0–100.0)
Platelets: 170 10*3/uL (ref 150–400)
RBC: 3.37 MIL/uL — ABNORMAL LOW (ref 3.87–5.11)
RDW: 15.5 % (ref 11.5–15.5)
WBC: 4.9 10*3/uL (ref 4.0–10.5)

## 2017-11-05 LAB — TROPONIN I
Troponin I: <0.03 ng/mL (ref <0.03)
Troponin I: <0.03 ng/mL (ref <0.03)

## 2017-11-05 LAB — HEPATIC FUNCTION PANEL
ALBUMIN: 2.2 g/dL — AB (ref 3.5–5.0)
ALK PHOS: 274 U/L — AB (ref 38–126)
ALT: 55 U/L — AB (ref 0–44)
AST: 93 U/L — AB (ref 15–41)
Bilirubin, Direct: 4.7 mg/dL — ABNORMAL HIGH (ref 0.0–0.2)
Indirect Bilirubin: 3.2 mg/dL — ABNORMAL HIGH (ref 0.3–0.9)
TOTAL PROTEIN: 7.2 g/dL (ref 6.5–8.1)
Total Bilirubin: 7.9 mg/dL — ABNORMAL HIGH (ref 0.3–1.2)

## 2017-11-05 LAB — CREATININE, SERUM
CREATININE: 0.49 mg/dL (ref 0.44–1.00)
GFR calc non Af Amer: >60 mL/min (ref >60)

## 2017-11-05 LAB — I-STAT TROPONIN, ED: TROPONIN I, POC: 0.02 ng/mL (ref 0.00–0.08)

## 2017-11-05 LAB — MRSA PCR SCREENING: MRSA BY PCR: NEGATIVE

## 2017-11-05 LAB — BRAIN NATRIURETIC PEPTIDE: B Natriuretic Peptide: 306 pg/mL — ABNORMAL HIGH (ref 0.0–100.0)

## 2017-11-05 LAB — BASIC METABOLIC PANEL
Anion gap: 7 (ref 5–15)
BUN: 7 mg/dL (ref 6–20)
CHLORIDE: 108 mmol/L (ref 98–111)
CO2: 25 mmol/L (ref 22–32)
CREATININE: 0.49 mg/dL (ref 0.44–1.00)
Calcium: 8.5 mg/dL — ABNORMAL LOW (ref 8.9–10.3)
GFR calc Af Amer: >60 mL/min (ref >60)
GFR calc non Af Amer: >60 mL/min (ref >60)
GLUCOSE: 110 mg/dL — AB (ref 70–99)
POTASSIUM: 3.4 mmol/L — AB (ref 3.5–5.1)
Sodium: 140 mmol/L (ref 135–145)

## 2017-11-05 LAB — D-DIMER, QUANTITATIVE (NOT AT ARMC): D DIMER QUANT: 1.63 ug{FEU}/mL — AB (ref 0.00–0.50)

## 2017-11-05 LAB — I-STAT BETA HCG BLOOD, ED (MC, WL, AP ONLY)

## 2017-11-05 LAB — MAGNESIUM: Magnesium: 1.8 mg/dL (ref 1.7–2.4)

## 2017-11-05 LAB — LIPASE, BLOOD: LIPASE: 50 U/L (ref 11–51)

## 2017-11-05 MED ORDER — HEPARIN SODIUM (PORCINE) 5000 UNIT/ML IJ SOLN: 5000.0000 [IU] | Freq: Three times a day (TID) | INTRAMUSCULAR | Status: DC

## 2017-11-05 MED ORDER — ASPIRIN EC 81 MG PO TBEC
81.0000 mg | DELAYED_RELEASE_TABLET | Freq: Every day | ORAL | Status: DC
  Administered 2017-11-06: 81 mg via ORAL
  Filled 2017-11-05: qty 1

## 2017-11-05 MED ORDER — URSODIOL 300 MG PO CAPS
600.0000 mg | ORAL_CAPSULE | Freq: Every day | ORAL | Status: DC
  Administered 2017-11-05: 600 mg via ORAL
  Filled 2017-11-05: qty 2

## 2017-11-05 MED ORDER — IOPAMIDOL (ISOVUE-370) INJECTION 76%
INTRAVENOUS | Status: AC
  Filled 2017-11-05: qty 100

## 2017-11-05 MED ORDER — METOPROLOL TARTRATE 12.5 MG HALF TABLET
12.5000 mg | ORAL_TABLET | Freq: Two times a day (BID) | ORAL | Status: DC
  Administered 2017-11-05 – 2017-11-06 (×2): 12.5 mg via ORAL
  Filled 2017-11-05 (×2): qty 1

## 2017-11-05 MED ORDER — ASPIRIN 81 MG PO CHEW
243.0000 mg | CHEWABLE_TABLET | Freq: Once | ORAL | Status: AC
  Administered 2017-11-05: 243 mg via ORAL

## 2017-11-05 MED ORDER — ASPIRIN 325 MG PO TABS: 243.0000 mg | ORAL_TABLET | Freq: Every day | ORAL | Status: DC

## 2017-11-05 MED ORDER — TEMAZEPAM 15 MG PO CAPS
30.0000 mg | ORAL_CAPSULE | Freq: Every day | ORAL | Status: DC
  Administered 2017-11-05: 30 mg via ORAL
  Filled 2017-11-05: qty 2

## 2017-11-05 MED ORDER — ACETAMINOPHEN 325 MG PO TABS: 650.0000 mg | ORAL_TABLET | ORAL | Status: DC | PRN

## 2017-11-05 MED ORDER — NITROGLYCERIN 0.4 MG SL SUBL: 0.4000 mg | SUBLINGUAL_TABLET | SUBLINGUAL | Status: DC | PRN

## 2017-11-05 MED ORDER — IBUPROFEN 600 MG PO TABS
600.0000 mg | ORAL_TABLET | Freq: Four times a day (QID) | ORAL | Status: DC | PRN
  Administered 2017-11-05: 600 mg via ORAL
  Filled 2017-11-05: qty 1

## 2017-11-05 MED ORDER — ONDANSETRON HCL 4 MG/2ML IJ SOLN: 4.0000 mg | Freq: Four times a day (QID) | INTRAMUSCULAR | Status: DC | PRN

## 2017-11-05 MED ORDER — ASPIRIN 81 MG PO CHEW: 324.0000 mg | CHEWABLE_TABLET | Freq: Once | ORAL | Status: DC

## 2017-11-05 MED ORDER — URSODIOL 300 MG PO CAPS
900.0000 mg | ORAL_CAPSULE | Freq: Every day | ORAL | Status: DC
  Administered 2017-11-06: 900 mg via ORAL
  Filled 2017-11-05: qty 3

## 2017-11-05 MED ORDER — FUROSEMIDE 10 MG/ML IJ SOLN
40.0000 mg | Freq: Once | INTRAMUSCULAR | Status: AC
  Administered 2017-11-05: 40 mg via INTRAVENOUS
  Filled 2017-11-05: qty 4

## 2017-11-05 MED ORDER — IOPAMIDOL (ISOVUE-370) INJECTION 76%
100.0000 mL | Freq: Once | INTRAVENOUS | Status: AC | PRN
  Administered 2017-11-05: 100 mL via INTRAVENOUS

## 2017-11-05 MED ORDER — CYCLOSPORINE 0.05 % OP EMUL
1.0000 [drp] | Freq: Every day | OPHTHALMIC | Status: DC
  Administered 2017-11-06: 1 [drp] via OPHTHALMIC
  Filled 2017-11-05: qty 1

## 2017-11-05 MED ORDER — POTASSIUM CHLORIDE CRYS ER 20 MEQ PO TBCR
40.0000 meq | EXTENDED_RELEASE_TABLET | ORAL | Status: AC
  Administered 2017-11-05 (×2): 40 meq via ORAL
  Filled 2017-11-05 (×2): qty 2

## 2017-11-05 NOTE — H&P (Addendum)
Came to check on patient. ER PA ordered CTA to exclude PE. Chest discomfort hasn't really improved. Has urinated twice but amount not currently known. D/w ER PA and patient. The concern is that her CP has not totally resolved, and we are concerned about definitive plan falling on the overnight team to make decisions when they would be unfamiliar with her history. Will plan to admit, cycle troponins, follow volume, check CRP/ESR given pleuritic component, f/u labs in AM, and await CTA. Pt agreeable. Please see consult note which functions as this patient's H/P. Dayna Dunn PA-C

## 2017-11-05 NOTE — ED Provider Notes (Signed)
[facility] EMERGENCY DEPARTMENT Provider Note   CSN: [id number] Arrival date & time: 11/05/17  1130     History   Chief Complaint No chief complaint on file.   HPI Christine Butler is a 47 y.o. female.  HPI   Christine Butler is a 47 y.o. female, with a history of CABG x2, dyslipidemia, GERD, primary biliary cirrhosis (autoimmune), presenting to the ED with chest pain beginning upon waking yesterday. Pain is right upper chest, sharp/stabbing, radiates through to the back.  She has no pain at rest, but with deep breathing, coughing, or exertion, she has 10 out of 10 pain.  Also has pain with movement of the right shoulder.  It feels similar to pain she experienced following her double bypass in January 2019 when she was found to have pleural effusions. She states it makes it difficult to breathe, but she does not necessarily feel short of breath. Patient went to an urgent care this morning and they noted "signs of ischemia" and sent her to the ED. She notes she has had discomfort with lying flat, including orthopnea and chest pressure, for the past 2 weeks. Denies cough, fever, N/V/D, falls/trauma, dizziness, syncope, abdominal pain, lower extremity edema or pain, or any other complaints.  Revankar is cardiologist.   Past Medical History:  Diagnosis Date  . Arthritis    right knee  . CAD (coronary artery disease)    a. s/p CABGx2 in 02/2017 (LAD not suitable for PCI), EF normal.  . Familial hyperlipidemia   . GERD (gastroesophageal reflux disease)   . IBS (irritable bowel syndrome)   . PONV (postoperative nausea and vomiting)   . Primary biliary cirrhosis ([place])   . S/P CABG (coronary artery bypass graft)   . SVD (spontaneous vaginal delivery)    x 3  . Urinary tract bacterial infections     Patient Active Problem List   Diagnosis Date Noted  . Chest pain 11/05/2017  . Dyslipidemia, goal LDL below 70 10/13/2017  . Acute blood loss anemia 04/20/2017  .  Elevated LFTs 04/20/2017  . Coronary artery disease 03/24/2017  . S/P CABG x 2 03/24/2017  . CAD (coronary artery disease), native coronary artery 03/23/2017  . 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 GERD (gastroesophageal reflux disease) 01/28/2012  . Primary biliary cirrhosis ([place]) 01/28/2012  . IBS (irritable bowel syndrome) 01/28/2012    Past Surgical History:  Procedure Laterality Date  . CORONARY ARTERY BYPASS GRAFT N/A 03/24/2017   Procedure: CORONARY ARTERY BYPASS GRAFTING (CABG) x two, using left internal mammary artery, left radial artery, and right leg greater saphenous vein harvested endoscopically;  Surgeon: Ivin Poot, MD;  Location: [facility];  Service: Open Heart Surgery;  Laterality: N/A;  . ENDOVEIN HARVEST OF GREATER SAPHENOUS VEIN Right 03/24/2017   Procedure: ENDOVEIN HARVEST OF GREATER SAPHENOUS VEIN;  Surgeon: Ivin Poot, MD;  Location: [facility];  Service: Open Heart Surgery;  Laterality: Right;  . INCONTINENCE SURGERY    . LEEP N/A 03/28/2014   Procedure: LOOP ELECTROSURGICAL EXCISION PROCEDURE (LEEP) cone biopsy;  Surgeon: Cyril Mourning, MD;  Location: [facility];  Service: Gynecology;  Laterality: N/A;  . LEFT HEART CATH AND CORONARY ANGIOGRAPHY N/A 03/23/2017   Procedure: LEFT HEART CATH AND CORONARY ANGIOGRAPHY;  Surgeon: Jettie Booze, MD;  Location: [facility];  Service: Cardiovascular;  Laterality: N/A;  . LIVER BIOPSY     x 2  . RADIAL ARTERY HARVEST Left 03/24/2017  Procedure: RADIAL ARTERY HARVEST;  Surgeon: Ivin Poot, MD;  Location: [facility];  Service: Open Heart Surgery;  Laterality: Left;  . TEE WITHOUT CARDIOVERSION N/A 03/24/2017   Procedure: TRANSESOPHAGEAL ECHOCARDIOGRAM (TEE);  Surgeon: Prescott Gum, Collier Salina, MD;  Location: [facility];  Service: Open Heart Surgery;  Laterality: N/A;  . WISDOM TOOTH EXTRACTION       OB History    Gravida  3   Para  3   Term      Preterm      AB      Living        SAB      TAB        Ectopic      Multiple      Live Births           Obstetric Comments  None         Home Medications    Prior to Admission medications   Medication Sig Start Date End Date Taking? Authorizing Provider  aspirin EC 81 MG tablet Take 1 tablet (81 mg total) by mouth daily. 05/04/17  Yes Barrett, Erin R, PA-C  cycloSPORINE (RESTASIS) 0.05 % ophthalmic emulsion Place 1 drop into both eyes daily.   Yes [provider]  diphenhydrAMINE (BENADRYL) 25 mg capsule Take 50 mg by mouth at bedtime as needed for itching.   Yes [provider]  furosemide (LASIX) 20 MG tablet Take 20 mg by mouth as needed.   Yes [provider]  hydrOXYzine (ATARAX/VISTARIL) 25 MG tablet Take 25 mg by mouth every 6 (six) hours as needed (unk).    Yes [provider]  metoprolol tartrate (LOPRESSOR) 25 MG tablet Take 0.5 tablets (12.5 mg total) by mouth 2 (two) times daily. 08/17/17  Yes Revankar, Reita Cliche, MD  Multiple Vitamins-Minerals (DEKAS PLUS PO) Take 1 capsule by mouth daily.   Yes [provider]  nitroGLYCERIN (NITROSTAT) 0.4 MG SL tablet Place 1 tablet (0.4 mg total) under the tongue every 5 (five) minutes as needed for chest pain. 05/12/17 11/05/17 Yes Ward, Ozella Almond, PA-C  omeprazole (PRILOSEC) 10 MG capsule Take 20 mg by mouth as needed.    Yes [provider]  Probiotic Product (PROBIOTIC PO) Take 1 tablet by mouth daily.   Yes [provider]  spironolactone (ALDACTONE) 50 MG tablet Take 1 tablet by mouth as needed. For swelling 08/24/17  Yes [provider]  temazepam (RESTORIL) 30 MG capsule Take 30 mg by mouth at bedtime.   Yes [provider]  ursodiol (ACTIGALL) 300 MG capsule Take 600-900 mg by mouth 2 (two) times daily. 900 mg in am,  600 mg in pm   Yes [provider]    Family History Family History  Problem Relation Age of Onset  . CAD Father   . Diabetes Mellitus II Father   . Heart disease Father    . Heart attack Brother   . Heart disease Maternal Aunt   . Heart attack Paternal Grandmother   . Heart attack Paternal Grandfather     Social History Social History   Tobacco Use  . Smoking status: Never Smoker  . Smokeless tobacco: Never Used  Substance Use Topics  . Alcohol use: Yes    Alcohol/week: 1.0 standard drinks    Types: 1 Glasses of wine per week    Comment: for dinner  . Drug use: No     Allergies   Codeine; Erythromycin; and Fentanyl   Review  of Systems Review of Systems  Respiratory: Negative for cough and shortness of breath.   Cardiovascular: Positive for chest pain. Negative for leg swelling.  Gastrointestinal: Negative for abdominal pain, diarrhea, nausea and vomiting.  Neurological: Negative for dizziness, syncope, weakness and light-headedness.  All other systems reviewed and are negative.    Physical Exam Updated Vital Signs BP 130/73 (BP Location: Right Arm)   Pulse 64   Temp 98.8 F (37.1 C) (Oral)   Resp (!) 24   Ht 5\' 2"  (1.575 m)   Wt 63.5 kg   LMP 10/08/2017   SpO2 100%   BMI 25.61 kg/m   Physical Exam  Constitutional: She appears well-developed and well-nourished. No distress.  HENT:  Head: Normocephalic and atraumatic.  Eyes: Conjunctivae are normal.  Neck: Normal range of motion. Neck supple.  Cardiovascular: Normal rate, regular rhythm, normal heart sounds and intact distal pulses.  Pulmonary/Chest: Effort normal and breath sounds normal. No respiratory distress.  No noted tenderness to the right upper chest, however, movement of the right shoulder or deep breathing causes pain.  Abdominal: Soft. There is no tenderness. There is no guarding.  Musculoskeletal: She exhibits no edema.  Lymphadenopathy:    She has no cervical adenopathy.  Neurological: She is alert.  Skin: Skin is warm and dry. Capillary refill takes less than 2 seconds. She is not diaphoretic.  Psychiatric: She has a normal mood and affect. Her behavior is  normal.  Nursing note and vitals reviewed.    ED Treatments / Results  Labs (all labs ordered are listed, but only abnormal results are displayed) Labs Reviewed  BASIC METABOLIC PANEL - Abnormal; Notable for the following components:      Result Value   Potassium 3.4 (*)    Glucose, Bld 110 (*)    Calcium 8.5 (*)    All other components within normal limits  CBC - Abnormal; Notable for the following components:   RBC 3.37 (*)    Hemoglobin 11.0 (*)    HCT 34.0 (*)    MCV 100.9 (*)    All other components within normal limits  D-DIMER, QUANTITATIVE (NOT AT [facility]) - Abnormal; Notable for the following components:   D-Dimer, Quant 1.63 (*)    All other components within normal limits  HEPATIC FUNCTION PANEL - Abnormal; Notable for the following components:   Albumin 2.2 (*)    AST 93 (*)    ALT 55 (*)    Alkaline Phosphatase 274 (*)    Total Bilirubin 7.9 (*)    Bilirubin, Direct 4.7 (*)    Indirect Bilirubin 3.2 (*)    All other components within normal limits  BRAIN NATRIURETIC PEPTIDE - Abnormal; Notable for the following components:   B Natriuretic Peptide 306.0 (*)    All other components within normal limits  TROPONIN I  I-STAT TROPONIN, ED  I-STAT BETA HCG BLOOD, ED ([facility], [facility], [facility] ONLY)    EKG EKG Interpretation  Date/Time:  Thursday November 05 2017 11:39:43 EDT Ventricular Rate:  79 PR Interval:    QRS Duration: 113 QT Interval:  409 QTC Calculation: 469 R Axis:   19 Text Interpretation:  Sinus rhythm Borderline intraventricular conduction delay Consider right ventricular hypertrophy Nonspecific T abnrm, anterolateral leads No significant change since last tracing Since last EKG in July, TWI and ST changes in anterior precordial leads is new Confirmed by Duffy Bruce [phone]) on 11/05/2017 2:55:30 PM   Radiology Dg Chest 2 View  Result Date: 11/05/2017 CLINICAL DATA:  Chest pain EXAM: CHEST - 2 VIEW COMPARISON:  August 23, 2017 FINDINGS: There is no edema or  consolidation. Heart is mildly enlarged with mild pulmonary venous hypertension. There is slight interstitial edema, stable. No consolidation. No adenopathy. Patient is status post coronary artery bypass grafting. No bone lesions. IMPRESSION: Pulmonary vascular congestion with mild chronic interstitial edema. Stable cardiac silhouette. No consolidation. Electronically Signed   By: Lowella Grip III M.D.   On: 11/05/2017 13:09    Procedures Procedures (including critical care time)  Medications Ordered in ED Medications  potassium chloride SA (K-DUR,KLOR-CON) CR tablet 40 mEq (40 mEq Oral Given 11/05/17 1550)  furosemide (LASIX) injection 40 mg (40 mg Intravenous Given 11/05/17 1550)     Initial Impression / Assessment and Plan / ED Course  I have reviewed the triage vital signs and the nursing notes.  Pertinent labs & imaging results that were available during my care of the patient were reviewed by me and considered in my medical decision making (see chart for details).  Clinical Course as of Nov 05 2004  Thu Nov 05, 2017  1348 Spoke with Christine Butler, Water engineer.  Cardiology team will see the patient.   [SJ]  3818 Patient appears to be comfortable, eating in bed.   [SJ]    Clinical Course User Index [SJ] Christine Quiett C, PA-C    Patient presents with chest pain, atypical sounding for ACS. Patient is nontoxic appearing, afebrile, not tachycardic, not tachypneic, not hypotensive, maintains excellent SPO2 on room air, and is in no apparent distress.  Original plan via cardiology was to administer Lasix, perform second troponin, and if negative, discharge. Due to the abrupt onset of the patient's symptoms, PE must be considered.  D-dimer positive, which could be from the patient's liver disease, however, it is elevated above previous values.  CTA indicated, but late in the ED course it was noted her IV was not large enough for CTA to be performed.  The decision was made to place patient in  observation with plan for CTA to be performed at a later time.  Findings and plan of care discussed with Duffy Bruce, MD. Dr. Ellender Hose personally evaluated and examined this patient.  Vitals:   11/05/17 1545 11/05/17 1615 11/05/17 1700 11/05/17 1845  BP: 116/66 110/71    Pulse: 69 73 79 78  Resp: 19 (!) 25 18 19   Temp:      TempSrc:      SpO2: 100% 100% 100% 99%  Weight:      Height:         Final Clinical Impressions(s) / ED Diagnoses   Final diagnoses:  Atypical chest pain    ED Discharge Orders    None       Layla Maw 11/05/17 2010    Duffy Bruce, MD 11/08/17 [phone]

## 2017-11-05 NOTE — ED Notes (Signed)
Cardiology at bedside.

## 2017-11-05 NOTE — ED Notes (Signed)
Pt ambulated to bathroom 

## 2017-11-05 NOTE — ED Notes (Signed)
Paged admitting Cards Per RN

## 2017-11-05 NOTE — ED Provider Notes (Signed)
[facility]    CSN: [id number] Arrival date & time: 11/05/17  1003     History   Chief Complaint Chief Complaint  Patient presents with  . Chest Pain  . Shoulder Pain    HPI Christine Butler is a 47 y.o. female.   HPI Christine Butler is a 47 y.o. female presenting to UC with c/o worsening Right sided chest/shoulder pain that started yesterday, radiates to her back. Worse with certain movements and breathing. Pain kept her from sleeping last night. She did take one aspirin 81mg  this morning w/o relief. Hx of CABG x2 on 03/23/17.  She had a normal echocardiogram 1 month ago.  She does not recall any injury. No recent congestion but did have a mild cough last night. Denies SOB at this time. No leg cramping or swelling. No hx of DVT or PE. 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     Past Medical History:  Diagnosis Date  . Arthritis    right knee  . GERD (gastroesophageal reflux disease)   . IBS (irritable bowel syndrome)   . PONV (postoperative nausea and vomiting)   . Primary biliary cholangitis ([place])   . Primary biliary cirrhosis ([place])   . SVD (spontaneous vaginal delivery)    x 3  . Urinary tract bacterial infections     Patient Active Problem List   Diagnosis Date Noted  . Dyslipidemia, goal LDL below 70 10/13/2017  . Acute blood loss anemia 04/20/2017  . Elevated LFTs 04/20/2017  . Coronary artery disease 03/24/2017  . S/P CABG x 2 03/24/2017  . CAD (coronary artery disease), native coronary artery 03/23/2017  . 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 GERD (gastroesophageal reflux disease) 01/28/2012  . Primary biliary cirrhosis ([place]) 01/28/2012  . IBS (irritable bowel syndrome) 01/28/2012    Past Surgical History:  Procedure Laterality Date  . CORONARY ARTERY BYPASS GRAFT N/A 03/24/2017   Procedure: CORONARY ARTERY BYPASS GRAFTING (CABG) x two, using left internal mammary artery, left radial artery,  and right leg greater saphenous vein harvested endoscopically;  Surgeon: Ivin Poot, MD;  Location: [facility];  Service: Open Heart Surgery;  Laterality: N/A;  . ENDOVEIN HARVEST OF GREATER SAPHENOUS VEIN Right 03/24/2017   Procedure: ENDOVEIN HARVEST OF GREATER SAPHENOUS VEIN;  Surgeon: Ivin Poot, MD;  Location: [facility];  Service: Open Heart Surgery;  Laterality: Right;  . INCONTINENCE SURGERY    . LEEP N/A 03/28/2014   Procedure: LOOP ELECTROSURGICAL EXCISION PROCEDURE (LEEP) cone biopsy;  Surgeon: Cyril Mourning, MD;  Location: [facility];  Service: Gynecology;  Laterality: N/A;  . LEFT HEART CATH AND CORONARY ANGIOGRAPHY N/A 03/23/2017   Procedure: LEFT HEART CATH AND CORONARY ANGIOGRAPHY;  Surgeon: Jettie Booze, MD;  Location: [facility];  Service: Cardiovascular;  Laterality: N/A;  . LIVER BIOPSY     x 2  . RADIAL ARTERY HARVEST Left 03/24/2017   Procedure: RADIAL ARTERY HARVEST;  Surgeon: Ivin Poot, MD;  Location: [facility];  Service: Open Heart Surgery;  Laterality: Left;  . TEE WITHOUT CARDIOVERSION N/A 03/24/2017   Procedure: TRANSESOPHAGEAL ECHOCARDIOGRAM (TEE);  Surgeon: Prescott Gum, Collier Salina, MD;  Location: [facility];  Service: Open Heart Surgery;  Laterality: N/A;  . WISDOM TOOTH EXTRACTION      OB History    Gravida  3   Para  3   Term      Preterm  AB      Living        SAB      TAB      Ectopic      Multiple      Live Births           Obstetric Comments  None         Home Medications    Prior to Admission medications   Medication Sig Start Date End Date Taking? Authorizing Provider  aspirin EC 81 MG tablet Take 1 tablet (81 mg total) by mouth daily. 05/04/17   Barrett, Nickolaos Brallier R, PA-C  Calcium Carbonate-Vitamin D (CALCIUM + D PO) Take 1 tablet by mouth 2 (two) times a week.     [provider]  cholecalciferol (VITAMIN D) 1000 units tablet Take 2,000 Units by mouth 2 (two) times a week.     [provider]    cycloSPORINE (RESTASIS) 0.05 % ophthalmic emulsion Place 1 drop into both eyes daily.    [provider]  diphenhydrAMINE (BENADRYL) 25 mg capsule Take 50 mg by mouth at bedtime as needed for itching.    [provider]  furosemide (LASIX) 20 MG tablet Take 20 mg by mouth as needed.    [provider]  hydrOXYzine (ATARAX/VISTARIL) 25 MG tablet Take 25 mg by mouth every 6 (six) hours.    [provider]  Melatonin 3 MG TABS Take 6 mg by mouth at bedtime.    [provider]  metoprolol tartrate (LOPRESSOR) 25 MG tablet Take 0.5 tablets (12.5 mg total) by mouth 2 (two) times daily. 08/17/17   Revankar, Reita Cliche, MD  Multiple Vitamin (MULTIVITAMIN WITH MINERALS) TABS Take 1 tablet by mouth 2 (two) times a week.     [provider]  nitroGLYCERIN (NITROSTAT) 0.4 MG SL tablet Place 1 tablet (0.4 mg total) under the tongue every 5 (five) minutes as needed for chest pain. 05/12/17 09/09/17  Ward, Ozella Almond, PA-C  omeprazole (PRILOSEC) 10 MG capsule Take 20 mg by mouth as needed.     [provider]  Probiotic Product (PROBIOTIC PO) Take 1 tablet by mouth daily.    [provider]  spironolactone (ALDACTONE) 50 MG tablet Take 1 tablet by mouth as needed. For swelling 08/24/17   [provider]  temazepam (RESTORIL) 30 MG capsule Take 30 mg by mouth at bedtime.    [provider]  ursodiol (ACTIGALL) 300 MG capsule Take 600-900 mg by mouth 2 (two) times daily. 900 mg in am,  600 mg in pm    [provider]    Family History Family History  Problem Relation Age of Onset  . CAD Father   . Diabetes Mellitus II Father   . Heart disease Father   . Heart attack Brother   . Heart disease Maternal Aunt   . Heart attack Paternal Grandmother   . Heart attack Paternal Grandfather     Social History Social History   Tobacco Use  . Smoking status: Never Smoker  . Smokeless tobacco: Never Used  Substance Use  Topics  . Alcohol use: Yes    Alcohol/week: 1.0 standard drinks    Types: 1 Glasses of wine per week    Comment: for dinner  . Drug use: No     Allergies   Codeine; Erythromycin; and Fentanyl   Review of Systems Review of Systems  Constitutional: Negative for chills, diaphoresis, fatigue and fever.  HENT: Negative for congestion.   Respiratory:  Positive for cough and chest tightness. Negative for shortness of breath.   Cardiovascular: Positive for chest pain. Negative for palpitations and leg swelling.  Gastrointestinal: Negative for diarrhea, nausea and vomiting.  Musculoskeletal: Positive for back pain.  Neurological: Negative for dizziness, light-headedness and headaches.     Physical Exam Triage Vital Signs ED Triage Vitals [11/05/17 1037]  Enc Vitals Group     BP 129/81     Pulse Rate 84     Resp      Temp 97.8 F (36.6 C)     Temp Source Oral     SpO2 99 %     Weight      Height      Head Circumference      Peak Flow      Pain Score      Pain Loc      Pain Edu?      Excl. in [place]?    No data found.  Updated Vital Signs BP 129/81 (BP Location: Right Arm)   Pulse 84   Temp 97.8 F (36.6 C) (Oral)   LMP 10/08/2017   SpO2 99%   Visual Acuity Right Eye Distance:   Left Eye Distance:   Bilateral Distance:    Right Eye Near:   Left Eye Near:    Bilateral Near:     Physical Exam  Constitutional: She is oriented to person, place, and time. She appears well-developed and well-nourished.  Non-toxic appearance. She does not appear ill.  Pt lying on exam bed, appears mildly anxious but is alert and cooperative during exam.  HENT:  Head: Normocephalic and atraumatic.  Eyes: EOM are normal.  Neck: Normal range of motion. Neck supple.  Cardiovascular: Normal rate and regular rhythm.  Pulmonary/Chest: Effort normal and breath sounds normal. No accessory muscle usage. No respiratory distress. She has no decreased breath sounds. She has no wheezes. She has no  rhonchi. She has no rales.  Musculoskeletal: Normal range of motion.  Neurological: She is alert and oriented to person, place, and time.  Skin: Skin is warm and dry.  Psychiatric: She has a normal mood and affect. Her behavior is normal.  Nursing note and vitals reviewed.    UC Treatments / Results  Labs (all labs ordered are listed, but only abnormal results are displayed) Labs Reviewed - No data to display  EKG Date/Time: 11/05/2017   10:21:10 Ventricular Rate: 85 PR Interval: 120 QRS Duration: 110 QT Interval: 424 QTC Calculation: 504 P-R-T axes: 28   17   28  Text Interpretation: Normal Sinus Rhythm. Possible Left atrial enlargement. Low voltage QRS. ST & T wave abnormality, consider anterior ischemia. Prolonged QT. Abnormal EKG.  Slight changes compared to EKG from June 30th, 2019.    Radiology No results found.  Procedures Procedures (including critical care time)  Medications Ordered in UC Medications  aspirin chewable tablet 243 mg (243 mg Oral Given 11/05/17 1045)    Initial Impression / Assessment and Plan / UC Course  I have reviewed the triage vital signs and the nursing notes.  Pertinent labs & imaging results that were available during my care of the patient were reviewed by me and considered in my medical decision making (see chart for details).     Pt c/o Right sided chest pain radiating to her back, worsening since yesterday. Hx of CABG x2 about 8 months ago.  Recommend pt go EMS to ED. Pt agreeable EMS transporting [facility] to [facility] ED.  Final Clinical  Impressions(s) / UC Diagnoses   Final diagnoses:  Chest pain, unspecified type  Nonspecific abnormal electrocardiogram (ECG) (EKG)   Discharge Instructions   None    ED Prescriptions    None     Controlled Substance Prescriptions [place] Controlled Substance Registry consulted? Not Applicable   Tyrell Antonio 11/05/17 1114

## 2017-11-05 NOTE — ED Triage Notes (Signed)
Pt started with right shoulder/chest pain yesterday am.  Progressively worse during the day, and pain so bad she could not sleep last night.  Pain into the shoulder blade when  taking a deep breath.

## 2017-11-05 NOTE — Consult Note (Signed)
Cardiology Consultation    Patient ID: Christine Butler MRN: [id number], DOB: Jan 20, 1971 Date of Encounter: 11/05/2017, 2:42 PM Primary Physician: Maude Leriche, PA-C Primary Cardiologist: [name], MD (also sees Dr. 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  She has had PBC for 10 years, most recently followed by Dr. [name], with hepatology and liver transplant service at [facility]. In late 2018 she developed symptoms of exertional CP and underwent echo which was normal, and GI eval with upper endoscopy. Definitive cath was arranged which demonstrated 95% stenosis of the LAD diagonal bifurcation, unsuitable for PCI, also residual 40% OM and 25% prox RCA, LVEF 55-65% (it was noted if cath needed in the future, would not use radial approach). Intra-op TEE was unremarkable. On 03/24/17 she underwent CABGx2 with left internal mammary artery to dominant diagonal, left radial artery free graft to left anteriordescending with interposition vein graft at the proximalanastomosis. Echocardiogram prior to discharge 04/01/17 showed EF 23-55%, normal diastolic function, RA mildly dilated. She had done fairly well until 07/2017 when she developed 10lb weight gain and marked edema. She was started on Lasix as well as spironolactone. Her weight gain resolved and she began to lose another 10lb so stopped these this summer. She saw Dr. Servando Snare back in follow-up who recommended repeat echo given fluid retention. This was done 09/30/17 showing EF 73-22%, normal diastolic function mildly dilated RV, mild RAE. She also has  seen Dr. Debara Pickett for management of her cholesterol and the plan was for hepatology to trend liver enzymes and let Dr. Debara Pickett know when OK to proceed with either pitavastatin or PCSK9 inhibitors.  Yesterday afternoon she had insidious onset of right sided sharp chest pain that radiated through her scapula. It is worse with certain movements including lifting right arm, deep breathing, sneezing or coughing. This pain reminded her of the post-op pain she felt after CABG when she had pleural effusions. It is not really exertional and she has not had any recurrent CP reminiscent of angina. For the past several months she's recognized what sounds like mild orthopnea. She becomes SOB/uncomfortable when she tries to lay back at night, so is now sleeping in a recliner. She says she has very small ankles and has been suspicious of mild edema. Outside of the orthopnea she denies DOE. No bleeding, palpitations, syncope. eight is fairly stable compared to 8/19 OV. Labs show negative troponin x 1, Hgb 11 (stable), K 3.4, Cr 0.49, HCG neg. CXR shows pulmonary vascular congestion with mild chronic interstitial edema, stable cardiac silhouette without consolidation.  Past Medical History:  Diagnosis Date  . Arthritis    right knee  . CAD (coronary artery disease)    a. s/p CABGx2 in 02/2017 (LAD not suitable for PCI), EF normal.  . GERD (gastroesophageal reflux disease)   . IBS (irritable bowel syndrome)   . PONV (postoperative nausea and vomiting)   . Primary biliary cholangitis ([place])   . Primary biliary cirrhosis ([place])   . SVD (spontaneous vaginal delivery)    x 3  . Urinary tract bacterial infections      Surgical History:  Past  Surgical History:  Procedure Laterality Date  . CORONARY ARTERY BYPASS GRAFT N/A 03/24/2017   Procedure: CORONARY ARTERY BYPASS GRAFTING (CABG) x two, using left internal mammary artery, left radial artery, and right leg greater saphenous vein harvested endoscopically;  Surgeon: Ivin Poot, MD;  Location: [facility];  Service: Open Heart Surgery;  Laterality: N/A;  . ENDOVEIN HARVEST OF GREATER SAPHENOUS VEIN Right 03/24/2017   Procedure: ENDOVEIN HARVEST OF GREATER SAPHENOUS VEIN;  Surgeon: Ivin Poot, MD;  Location: [place];  Service: Open Heart Surgery;  Laterality: Right;  . INCONTINENCE SURGERY    . LEEP N/A 03/28/2014   Procedure: LOOP ELECTROSURGICAL EXCISION PROCEDURE (LEEP) cone biopsy;  Surgeon: Cyril Mourning, MD;  Location: [facility];  Service: Gynecology;  Laterality: N/A;  . LEFT HEART CATH AND CORONARY ANGIOGRAPHY N/A 03/23/2017   Procedure: LEFT HEART CATH AND CORONARY ANGIOGRAPHY;  Surgeon: Jettie Booze, MD;  Location: [place] [facility];  Service: Cardiovascular;  Laterality: N/A;  . LIVER BIOPSY     x 2  . RADIAL ARTERY HARVEST Left 03/24/2017   Procedure: RADIAL ARTERY HARVEST;  Surgeon: Ivin Poot, MD;  Location: [facility];  Service: Open Heart Surgery;  Laterality: Left;  . TEE WITHOUT CARDIOVERSION N/A 03/24/2017   Procedure: TRANSESOPHAGEAL ECHOCARDIOGRAM (TEE);  Surgeon: Prescott Gum, Collier Salina, MD;  Location: [place];  Service: Open Heart Surgery;  Laterality: N/A;  . WISDOM TOOTH EXTRACTION       Home Meds: Prior to Admission medications   Medication Sig Start Date End Date Taking? Authorizing Provider  aspirin EC 81 MG tablet Take 1 tablet (81 mg total) by mouth daily. 05/04/17   Barrett, Erin R, PA-C  Calcium Carbonate-Vitamin D (CALCIUM + D PO) Take 1 tablet by mouth 2 (two) times a week.     [provider]  cholecalciferol (VITAMIN D) 1000 units tablet Take 2,000 Units by mouth 2 (two) times a week.     [provider]  cycloSPORINE (RESTASIS) 0.05 % ophthalmic emulsion Place 1 drop into both eyes daily.    [provider]  diphenhydrAMINE (BENADRYL) 25 mg capsule Take 50 mg by mouth at bedtime as needed for itching.    [provider]  furosemide (LASIX) 20 MG tablet Take 20 mg by mouth as needed.     [provider]  hydrOXYzine (ATARAX/VISTARIL) 25 MG tablet Take 25 mg by mouth every 6 (six) hours.    [provider]  Melatonin 3 MG TABS Take 6 mg by mouth at bedtime.    [provider]  metoprolol tartrate (LOPRESSOR) 25 MG tablet Take 0.5 tablets (12.5 mg total) by mouth 2 (two) times daily. 08/17/17   Revankar, Reita Cliche, MD  Multiple Vitamin (MULTIVITAMIN WITH MINERALS) TABS Take 1 tablet by mouth 2 (two) times a week.     [provider]  nitroGLYCERIN (NITROSTAT) 0.4 MG SL tablet Place 1 tablet (0.4 mg total) under the tongue every 5 (five) minutes as needed for chest pain. 05/12/17 09/09/17  Ward, Ozella Almond, PA-C  omeprazole (PRILOSEC) 10 MG capsule Take 20 mg by mouth as needed.     [provider]  Probiotic Product (PROBIOTIC PO) Take 1 tablet by mouth daily.    [provider]  spironolactone (ALDACTONE) 50 MG tablet Take 1 tablet by mouth as needed. For swelling 08/24/17   [provider]  temazepam (RESTORIL) 30 MG capsule Take 30 mg by mouth at bedtime.    [provider]  ursodiol (ACTIGALL) 300 MG capsule Take 600-900 mg by mouth 2 (two) times daily. 900 mg in am,  600 mg in pm    [provider]    Allergies:  Allergies  Allergen Reactions  . Codeine Nausea And Vomiting  . Erythromycin Nausea And Vomiting  . Fentanyl Nausea And Vomiting    Social History   Socioeconomic History  . Marital status: Single    Spouse name: Not on file  . Number of children: 3  . Years of education: 73  . Highest education level: Associate degree: academic program  Occupational History  . Not on file  Social Needs  . Financial resource strain: Not hard at all  . Food insecurity:    Worry: Never true    Inability: Never true  . Transportation needs:    Medical: No    Non-medical: No  Tobacco Use  . Smoking status: Never Smoker  . Smokeless tobacco: Never Used  Substance and Sexual Activity  .  Alcohol use: Yes    Alcohol/week: 1.0 standard drinks    Types: 1 Glasses of wine per week    Comment: for dinner  . Drug use: No  . Sexual activity: Yes    Partners: Male    Birth control/protection: None  Lifestyle  . Physical activity:    Days per week: 0 days    Minutes per session: 0 min  . Stress: Not at all  Relationships  . Social connections:    Talks on phone: More than three times a week    Gets together: More than three times a week    Attends religious service: More than 4 times per year    Active member of club or organization: Yes    Attends meetings of clubs or organizations: More than 4 times per year    Relationship status: Divorced  . Intimate partner violence:    Fear of current or ex partner: No    Emotionally abused: No    Physically abused: No    Forced sexual activity: No  Other Topics Concern  . Not on file  Social History Narrative  . Not on file     Family History  Problem Relation Age of Onset  . CAD Father   . Diabetes Mellitus II Father   . Heart disease Father   . Heart attack Brother   . Heart disease Maternal Aunt   . Heart attack Paternal Grandmother   . Heart attack Paternal Grandfather     Review of Systems: All other systems reviewed and are otherwise negative except as noted above.  Labs:   Lab Results  Component Value Date   WBC 4.9 11/05/2017   HGB 11.0 (L) 11/05/2017   HCT 34.0 (L) 11/05/2017   MCV 100.9 (H) 11/05/2017   PLT 170 11/05/2017    Recent Labs  Lab 11/05/17 1210  NA 140  K 3.4*  CL 108  CO2 25  BUN 7  CREATININE 0.49  CALCIUM 8.5*  GLUCOSE 110*   No results for input(s): CKTOTAL, CKMB, TROPONINI in the last 72 hours. Lab Results  Component Value Date   CHOL 406 (H) 03/23/2017   HDL 10 (L) 03/23/2017   LDLCALC 334 (H) 03/23/2017   TRIG 309 (H) 03/23/2017   Lab Results  Component Value Date   DDIMER 1.03 (H) 11/07/2016    Radiology/Studies:  Dg Chest 2 View  Result Date:  11/05/2017 CLINICAL DATA:  Chest pain EXAM: CHEST -  2 VIEW COMPARISON:  August 23, 2017 FINDINGS: There is no edema or consolidation. Heart is mildly enlarged with mild pulmonary venous hypertension. There is slight interstitial edema, stable. No consolidation. No adenopathy. Patient is status post coronary artery bypass grafting. No bone lesions. IMPRESSION: Pulmonary vascular congestion with mild chronic interstitial edema. Stable cardiac silhouette. No consolidation. Electronically Signed   By: Lowella Grip III M.D.   On: 11/05/2017 13:09   Wt Readings from Last 3 Encounters:  11/05/17 63.5 kg  10/12/17 64 kg  09/09/17 59 kg    EKG: NSR 79bpm, consider RVH, nonspecific St-T changes diffusely, similar to prior  Physical Exam Blood pressure 112/81, pulse 63, temperature 98.8 F (37.1 C), temperature source Oral, resp. rate 11, height 5\' 2"  (1.575 m), weight 63.5 kg, last menstrual period 10/08/2017, SpO2 100 %. Body mass index is 25.61 kg/m. General: Well developed WF in no acute distress. Mild jaundice Head: Normocephalic, atraumatic, sclera non-icteric, no xanthomas, nares are without discharge.  Neck: Negative for carotid bruits. JVD not elevated. Lungs: Faint crackles bilaterally to auscultation without wheezes, rales, or rhonchi. Breathing is unlabored. Heart: RRR with S1 S2. Soft SEM at LUSB with split S2. No rubs or gallops appreciated. Abdomen: Soft, non-tender, non-distended with normoactive bowel sounds. No hepatomegaly. No rebound/guarding. No obvious abdominal masses. Msk:  Strength and tone appear normal for age. Extremities: No clubbing or cyanosis. Trace BLE edema.  Distal pedal pulses are 2+ and equal bilaterally. Neuro: Alert and oriented X 3. No focal deficit. No facial asymmetry. Moves all extremities spontaneously. Psych:  Responds to questions appropriately with a normal affect.    Assessment and Plan   1. Atypical/pleuritic chest pain - initial troponin is  negative. Symptoms are not consistent with prior angina and doubt ACS. There are some pleuritic and some MSK features. There is a possibility it could be related to a biliary process given underlying hepatic disease - LFTs are abnormal but appear stable or improved from prior values. D-dimer is unlikely to be helpful due to her liver disease. She is not tachycardic, tachypneic or hypoxic so PE felt less likely but may need to be considered if symptoms do improve. Exam is consistent with some pulmonary fluid overload so will diurese with Lasix 40mg  in ER and check another troponin. Further decisions will be made based on her clinical response.  2. Orthopnea with history of volume overload - vascular congestion and mild edema noted on workup, supect driven moreso by liver failure than heart failure. LVEF has been primarily normal with normal diastolic parameters. She has a faint murmur at LUSB with split S2. There is no evidence of pulmonic stenosis on echo so this is felt to represent possibly increased right heart pressures given her liver disease per d/w MD.  3. Hyperlipidemia - further plans for pitavastatin vs PCSK9 to be made as OP.  4. Primary biliary cirrhosis - followed by [facility].  5. Hypokalemia - plan to replete 31meq po x 2 in ER.   For questions or updates, please contact [place] Please consult [email or web address] for contact info under Cardiology/STEMI.  Signed, Charlie Pitter, PA-C 11/05/2017, 2:42 PM

## 2017-11-05 NOTE — ED Triage Notes (Signed)
EMS reports pt from [facility]. Pt has abnormal ekg, she took 81mg  ASA at home and was given 324mg  at [facility] in [place]. Pt denies pain, but does have right sided discomfort especially with movement, coughing and sneezing. Hx of CABG.

## 2017-11-06 ENCOUNTER — Encounter (HOSPITAL_COMMUNITY): Payer: Self-pay | Admitting: Cardiology

## 2017-11-06 DIAGNOSIS — I2510 Atherosclerotic heart disease of native coronary artery without angina pectoris: Secondary | ICD-10-CM | POA: Diagnosis not present

## 2017-11-06 DIAGNOSIS — I25110 Atherosclerotic heart disease of native coronary artery with unstable angina pectoris: Secondary | ICD-10-CM

## 2017-11-06 DIAGNOSIS — K743 Primary biliary cirrhosis: Secondary | ICD-10-CM | POA: Diagnosis not present

## 2017-11-06 DIAGNOSIS — R0781 Pleurodynia: Secondary | ICD-10-CM | POA: Diagnosis not present

## 2017-11-06 DIAGNOSIS — Z951 Presence of aortocoronary bypass graft: Secondary | ICD-10-CM | POA: Diagnosis not present

## 2017-11-06 DIAGNOSIS — R188 Other ascites: Secondary | ICD-10-CM | POA: Diagnosis not present

## 2017-11-06 DIAGNOSIS — Z79899 Other long term (current) drug therapy: Secondary | ICD-10-CM | POA: Diagnosis not present

## 2017-11-06 DIAGNOSIS — Z7982 Long term (current) use of aspirin: Secondary | ICD-10-CM | POA: Diagnosis not present

## 2017-11-06 DIAGNOSIS — R0789 Other chest pain: Secondary | ICD-10-CM | POA: Diagnosis not present

## 2017-11-06 HISTORY — DX: Other ascites: R18.8

## 2017-11-06 LAB — CBC
HCT: 32 % — ABNORMAL LOW (ref 36.0–46.0)
HEMATOCRIT: 31 % — AB (ref 36.0–46.0)
Hemoglobin: 10.4 g/dL — ABNORMAL LOW (ref 12.0–15.0)
Hemoglobin: 10.3 g/dL — ABNORMAL LOW (ref 12.0–15.0)
MCH: 32.4 pg (ref 26.0–34.0)
MCH: 32.9 pg (ref 26.0–34.0)
MCHC: 32.5 g/dL (ref 30.0–36.0)
MCHC: 33.2 g/dL (ref 30.0–36.0)
MCV: 99.7 fL (ref 78.0–100.0)
MCV: 99 fL (ref 78.0–100.0)
PLATELETS: 166 10*3/uL (ref 150–400)
Platelets: 159 10*3/uL (ref 150–400)
RBC: 3.13 MIL/uL — ABNORMAL LOW (ref 3.87–5.11)
RBC: 3.21 MIL/uL — AB (ref 3.87–5.11)
RDW: 15.5 % (ref 11.5–15.5)
RDW: 15.5 % (ref 11.5–15.5)
WBC: 4.8 10*3/uL (ref 4.0–10.5)
WBC: 5.6 10*3/uL (ref 4.0–10.5)

## 2017-11-06 LAB — BASIC METABOLIC PANEL
Anion gap: 9 (ref 5–15)
BUN: 9 mg/dL (ref 6–20)
CALCIUM: 8.3 mg/dL — AB (ref 8.9–10.3)
CO2: 22 mmol/L (ref 22–32)
Chloride: 107 mmol/L (ref 98–111)
Creatinine, Ser: 0.54 mg/dL (ref 0.44–1.00)
GFR calc Af Amer: >60 mL/min (ref >60)
GLUCOSE: 79 mg/dL (ref 70–99)
POTASSIUM: 4 mmol/L (ref 3.5–5.1)
SODIUM: 138 mmol/L (ref 135–145)

## 2017-11-06 LAB — TROPONIN I

## 2017-11-06 LAB — C-REACTIVE PROTEIN: CRP: 9.7 mg/dL — AB (ref <1.0)

## 2017-11-06 LAB — SEDIMENTATION RATE: Sed Rate: 52 mm/hr — ABNORMAL HIGH (ref 0–22)

## 2017-11-06 MED ORDER — IBUPROFEN 600 MG PO TABS: 600.0000 mg | ORAL_TABLET | Freq: Four times a day (QID) | ORAL | 0 refills | Status: DC | PRN

## 2017-11-06 MED ORDER — FUROSEMIDE 20 MG PO TABS: 20.0000 mg | ORAL_TABLET | Freq: Every day | ORAL | 6 refills | Status: DC

## 2017-11-06 MED FILL — IBUPROFEN 600 MG TABLET: 600 | 7 days supply | Qty: 30 | Fill #0

## 2017-11-06 NOTE — Progress Notes (Addendum)
Progress Note  Patient Name: Christine Butler Date of Encounter: 11/06/2017  Primary Cardiologist: [name], MD   Subjective   Chest pain improved, ibuprofen has taken the edge off, no SOB.   Inpatient Medications    Scheduled Meds: . aspirin EC  81 mg Oral Daily  . cycloSPORINE  1 drop Both Eyes Daily  . heparin  5,000 Units Subcutaneous Q8H  . metoprolol tartrate  12.5 mg Oral BID  . temazepam  30 mg Oral QHS  . ursodiol  600 mg Oral QHS  . ursodiol  900 mg Oral Daily   Continuous Infusions:  PRN Meds: acetaminophen, ibuprofen, nitroGLYCERIN, ondansetron (ZOFRAN) IV   Vital Signs    Vitals:   11/05/17 2015 11/05/17 2115 11/06/17 0610 11/06/17 0846  BP:  131/72 (!) 96/45 104/69  Pulse: 83 81 67 73  Resp: 20 18 16    Temp:  98.2 F (36.8 C) 98.3 F (36.8 C) 98.1 F (36.7 C)  TempSrc:  Oral Oral Oral  SpO2: 100% 100% 98% 99%  Weight:  65.5 kg    Height:  5\' 2"  (1.575 m)      Intake/Output Summary (Last 24 hours) at 11/06/2017 0925 Last data filed at 11/05/2017 2200 Gross per 24 hour  Intake 360 ml  Output -  Net 360 ml   Filed Weights   11/05/17 1156 11/05/17 2115  Weight: 63.5 kg 65.5 kg    Telemetry    SR - Personally Reviewed  ECG    No new - Personally Reviewed  Physical Exam   GEN: No acute distress.   Neck: No JVD Cardiac: RRR, no murmurs, rubs, or gallops.  Respiratory: Clear to auscultation bilaterally. GI: Soft, nontender, non-distended  MS: No edema; No deformity. Neuro:  Nonfocal  Psych: Normal affect   Labs    Chemistry Recent Labs  Lab 11/05/17 1210 11/05/17 1215 11/05/17 2212 11/06/17 0429  NA 140  --   --  138  K 3.4*  --   --  4.0  CL 108  --   --  107  CO2 25  --   --  22  GLUCOSE 110*  --   --  79  BUN 7  --   --  9  CREATININE 0.49  --  0.49 0.54  CALCIUM 8.5*  --   --  8.3*  PROT  --  7.2  --   --   ALBUMIN  --  2.2*  --   --   AST  --  93*  --   --   ALT  --  55*  --   --   ALKPHOS  --  274*  --    --   BILITOT  --  7.9*  --   --   GFRNONAA >60  --  >60 >60  GFRAA >60  --  >60 >60  ANIONGAP 7  --   --  9     Hematology Recent Labs  Lab 11/05/17 1210 11/05/17 2354 11/06/17 0429  WBC 4.9 5.6 4.8  RBC 3.37* 3.21* 3.13*  HGB 11.0* 10.4* 10.3*  HCT 34.0* 32.0* 31.0*  MCV 100.9* 99.7 99.0  MCH 32.6 32.4 32.9  MCHC 32.4 32.5 33.2  RDW 15.5 15.5 15.5  PLT 170 166 159    Cardiac Enzymes Recent Labs  Lab 11/05/17 1547 11/05/17 2212 11/06/17 0429  TROPONINI <0.03 <0.03 <0.03    Recent Labs  Lab 11/05/17 1220  TROPIPOC 0.02  BNP Recent Labs  Lab 11/05/17 1547  BNP 306.0*     DDimer  Recent Labs  Lab 11/05/17 1215  DDIMER 1.63*     Radiology    Dg Chest 2 View  Result Date: 11/05/2017 CLINICAL DATA:  Chest pain EXAM: CHEST - 2 VIEW COMPARISON:  August 23, 2017 FINDINGS: There is no edema or consolidation. Heart is mildly enlarged with mild pulmonary venous hypertension. There is slight interstitial edema, stable. No consolidation. No adenopathy. Patient is status post coronary artery bypass grafting. No bone lesions. IMPRESSION: Pulmonary vascular congestion with mild chronic interstitial edema. Stable cardiac silhouette. No consolidation. Electronically Signed   By: Lowella Grip III M.D.   On: 11/05/2017 13:09   Ct Angio Chest Pe W And/or Wo Contrast  Result Date: 11/05/2017 CLINICAL DATA:  Pain EXAM: CT ANGIOGRAPHY CHEST WITH CONTRAST TECHNIQUE: Multidetector CT imaging of the chest was performed using the standard protocol during bolus administration of intravenous contrast. Multiplanar CT image reconstructions and MIPs were obtained to evaluate the vascular anatomy. CONTRAST:  129mL ISOVUE-370 IOPAMIDOL (ISOVUE-370) INJECTION 76% COMPARISON:  Chest x-ray 11/05/2017 CT chest 11/07/2016 FINDINGS: Cardiovascular: Satisfactory opacification of the pulmonary arteries to the segmental level. No evidence of pulmonary embolism. Nonaneurysmal aorta. Mild aortic  atherosclerosis. Post CABG changes. Mild cardiomegaly. No pericardial effusion. Mediastinum/Nodes: No enlarged mediastinal, hilar, or axillary lymph nodes. Thyroid gland, trachea, and esophagus demonstrate no significant findings. Lungs/Pleura: Lungs are clear. No pleural effusion or pneumothorax. Upper Abdomen: Mild ascites in the right upper quadrant. Subtle contour nodularity of the liver. Partially visualized between appears enlarged Musculoskeletal: No chest wall abnormality. No acute or significant osseous findings. Review of the MIP images confirms the above findings. IMPRESSION: 1. Negative for acute pulmonary embolus. 2. Clear lung fields 3. Small amount of ascites in the upper abdomen with suspected cirrhotic changes of the liver. Partially visualized spleen appears enlarged. Aortic Atherosclerosis (ICD10-I70.0). Electronically Signed   By: Donavan Foil M.D.   On: 11/05/2017 21:08    Cardiac Studies   09/30/17  ECHO Study Conclusions  - Left ventricle: The cavity size was normal. Systolic function was   normal. The estimated ejection fraction was in the range of 55%   to 60%. Wall motion was normal; there were no regional wall   motion abnormalities. Left ventricular diastolic function   parameters were normal. - Aortic valve: Valve area (Vmax): 2.35 cm^2. - Left atrium: The atrium was moderately dilated. - Right ventricle: The cavity size was mildly dilated. Wall   thickness was normal. - Right atrium: The atrium was mildly dilated.  Impressions:  - Normal LVEF.   LA moderate enlargement.   RA, RV mild enlargement.   Normal PAP.   Trace MR   MIld TR.  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    Assessment & Plan    1. Atypical/pleuritic chest pain - initial troponin is negative. Symptoms are not consistent with prior angina and doubt ACS. There are some pleuritic and some MSK features. There is a possibility it could be related to a biliary process given underlying hepatic disease - LFTs are abnormal but appear stable or improved from prior values.  D-dimer is unlikely to be helpful due to her liver disease. She is not tachycardic, tachypneic or hypoxic so PE felt less likely but may need to be considered if symptoms do improve. Exam is consistent with some pulmonary fluid overload so will diurese with Lasix 40mg  in ER and check another troponin. --I&O not accurate --troponin neg X 3-- no MI --neg PE, clear lungs, + ascites with liver disease.  Pain more controlled today most likely discharge.   2. Orthopnea with history of volume overload - vascular congestion and mild edema noted on workup, supect driven moreso by liver failure than heart failure. LVEF has been primarily normal with normal diastolic parameters. She has a faint murmur at LUSB with split S2. There is no evidence of pulmonic stenosis on echo so this is felt to represent possibly increased right heart pressures given her liver disease per d/w MD.  --with tests and CTA of chest this is correct  3. Hyperlipidemia - further plans for pitavastatin vs PCSK9 to be made as OP.  4. Primary biliary cirrhosis - followed by [facility].  5. Hypokalemia - plan to replete 48meq po x 2 in ER. Today k+ is 4.0  Will ambulate and plan for discharge.   For questions or updates, please contact [place] Please consult [email or web address] for contact info under        Signed, Cecilie Kicks, NP  11/06/2017, 9:25 AM

## 2017-11-06 NOTE — Discharge Instructions (Signed)
Call if recurrent pain  We added back your lasix to 20 mg daily.    Follow up with GI in next week.

## 2017-11-06 NOTE — Progress Notes (Signed)
IV D/C. Catheter intact. Site clean and dry.

## 2017-11-06 NOTE — Discharge Summary (Signed)
Discharge Summary    Patient ID: Christine Butler MRN: [id number]; DOB: [birth year]  Admit date: 11/05/2017 Discharge date: 11/06/2017  Primary Care Provider: Maude Leriche, PA-C  Primary Cardiologist: [name], MD  Primary Electrophysiologist:  None   Discharge Diagnoses    Principal Problem:   Atypical chest pain Active Problems:   Primary biliary cirrhosis ([place])   CAD (coronary artery disease), native coronary artery   S/P CABG x 2   Ascites--mild this admit   Allergies Allergies  Allergen Reactions  . Codeine Nausea And Vomiting  . Erythromycin Nausea And Vomiting  . Fentanyl Nausea And Vomiting    Diagnostic Studies/Procedures    Radiology see below _____________   History of Present Illness     70 yoF with complex hx of CAD with CABG X2 with arterial grafts in 02/2017.  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 She has had PBC for 10 years, most recently followed by Dr. Ellouise Newer hepatology and liver transplant service at [facility]. In late 2018 she developed symptoms of exertional CP and underwent echo which was normal, and GI eval with upper endoscopy.   Now presented to ER 11/05/17 with chest pain Rt sided and radiated through to her scapula.  After exam pain was thought to be atypical  For cardiac.  She was to have CTA of chest then for discharge but her pain stayed more intense so admitted to OBS for overnight to monitor troponins and have improved her pain.      Hospital Course     Consultants:none.   Overnight pt improved with "edge off pain"   Her troponins were all neg for MI.  Her CTA of her chest was without PE -lungs clear but does have ascites.  Previously had been on aldactone and lasix for edema but with wt loss this was stopped.  She was given a dose of lasix on admit but no I&O.    Today feeling better.  She has been seen and examined by Dr. Debara Pickett and will discharge home.  She has appts with Dr.  Debara Pickett for Lipid clinic and will see GI in near future.  Will discharge on lasix po 20 mg.   _____________  Discharge Vitals Blood pressure 104/69, pulse 73, temperature 98.1 F (36.7 C), temperature source Oral, resp. rate 16, height 5\' 2"  (1.575 m), weight 65.5 kg, last menstrual period 10/08/2017, SpO2 99 %.  Filed Weights   11/05/17 1156 11/05/17 2115  Weight: 63.5 kg 65.5 kg    Labs & Radiologic Studies    CBC Recent Labs    11/05/17 2354 11/06/17 0429  WBC 5.6 4.8  HGB 10.4* 10.3*  HCT 32.0* 31.0*  MCV 99.7 99.0  PLT 166 170   Basic Metabolic Panel Recent Labs    11/05/17 1210 11/05/17 2212 11/06/17 0429  NA 140  --  138  K 3.4*  --  4.0  CL 108  --  107  CO2 25  --  22  GLUCOSE 110*  --  79  BUN 7  --  9  CREATININE 0.49 0.49 0.54  CALCIUM 8.5*  --  8.3*  MG  --  1.8  --    Liver Function Tests Recent Labs    11/05/17 1215  AST 93*  ALT 55*  ALKPHOS 274*  BILITOT 7.9*  PROT 7.2  ALBUMIN 2.2*   Recent Labs    11/05/17 2212  LIPASE 50   Cardiac Enzymes Recent  Labs    11/05/17 1547 11/05/17 2212 11/06/17 0429  TROPONINI <0.03 <0.03 <0.03   BNP Invalid input(s): POCBNP D-Dimer Recent Labs    11/05/17 1215  DDIMER 1.63*   Hemoglobin A1C No results for input(s): HGBA1C in the last 72 hours. Fasting Lipid Panel No results for input(s): CHOL, HDL, LDLCALC, TRIG, CHOLHDL, LDLDIRECT in the last 72 hours. Thyroid Function Tests No results for input(s): TSH, T4TOTAL, T3FREE, THYROIDAB in the last 72 hours.  Invalid input(s): FREET3 _____________  Dg Chest 2 View  Result Date: 11/05/2017 CLINICAL DATA:  Chest pain EXAM: CHEST - 2 VIEW COMPARISON:  August 23, 2017 FINDINGS: There is no edema or consolidation. Heart is mildly enlarged with mild pulmonary venous hypertension. There is slight interstitial edema, stable. No consolidation. No adenopathy. Patient is status post coronary artery bypass grafting. No bone lesions. IMPRESSION: Pulmonary  vascular congestion with mild chronic interstitial edema. Stable cardiac silhouette. No consolidation. Electronically Signed   By: Lowella Grip III M.D.   On: 11/05/2017 13:09   Ct Angio Chest Pe W And/or Wo Contrast  Result Date: 11/05/2017 CLINICAL DATA:  Pain EXAM: CT ANGIOGRAPHY CHEST WITH CONTRAST TECHNIQUE: Multidetector CT imaging of the chest was performed using the standard protocol during bolus administration of intravenous contrast. Multiplanar CT image reconstructions and MIPs were obtained to evaluate the vascular anatomy. CONTRAST:  127mL ISOVUE-370 IOPAMIDOL (ISOVUE-370) INJECTION 76% COMPARISON:  Chest x-ray 11/05/2017 CT chest 11/07/2016 FINDINGS: Cardiovascular: Satisfactory opacification of the pulmonary arteries to the segmental level. No evidence of pulmonary embolism. Nonaneurysmal aorta. Mild aortic atherosclerosis. Post CABG changes. Mild cardiomegaly. No pericardial effusion. Mediastinum/Nodes: No enlarged mediastinal, hilar, or axillary lymph nodes. Thyroid gland, trachea, and esophagus demonstrate no significant findings. Lungs/Pleura: Lungs are clear. No pleural effusion or pneumothorax. Upper Abdomen: Mild ascites in the right upper quadrant. Subtle contour nodularity of the liver. Partially visualized between appears enlarged Musculoskeletal: No chest wall abnormality. No acute or significant osseous findings. Review of the MIP images confirms the above findings. IMPRESSION: 1. Negative for acute pulmonary embolus. 2. Clear lung fields 3. Small amount of ascites in the upper abdomen with suspected cirrhotic changes of the liver. Partially visualized spleen appears enlarged. Aortic Atherosclerosis (ICD10-I70.0). Electronically Signed   By: Donavan Foil M.D.   On: 11/05/2017 21:08   Disposition   Pt is being discharged home today in good condition.  Follow-up Plans & Appointments    Follow-up Information    Revankar, Reita Cliche, MD Follow up.   Specialty:   Cardiology Why:  call office to be seen in 1-2 months.   Contact information: [address] [place] [address] [phone]        Pixie Casino, MD Follow up on 12/17/2017.   Specialty:  Cardiology Why:  as previously arranged Contact information: [place] [address] [phone]            Discharge Medications   Allergies as of 11/06/2017      Reactions   Codeine Nausea And Vomiting   Erythromycin Nausea And Vomiting   Fentanyl Nausea And Vomiting      Medication List    TAKE these medications   aspirin EC 81 MG tablet Take 1 tablet (81 mg total) by mouth daily.   cycloSPORINE 0.05 % ophthalmic emulsion Commonly known as:  RESTASIS Place 1 drop into both eyes daily.   DEKAS PLUS PO Take 1 capsule by mouth daily.   diphenhydrAMINE 25 mg capsule Commonly  known as:  BENADRYL Take 50 mg by mouth at bedtime as needed for itching.   furosemide 20 MG tablet Commonly known as:  LASIX Take 1 tablet (20 mg total) by mouth daily. What changed:    when to take this  reasons to take this   hydrOXYzine 25 MG tablet Commonly known as:  ATARAX/VISTARIL Take 25 mg by mouth every 6 (six) hours as needed (unk).   ibuprofen 600 MG tablet Commonly known as:  ADVIL,MOTRIN Take 1 tablet (600 mg total) by mouth every 6 (six) hours as needed for headache, mild pain or moderate pain.   metoprolol tartrate 25 MG tablet Commonly known as:  LOPRESSOR Take 0.5 tablets (12.5 mg total) by mouth 2 (two) times daily.   nitroGLYCERIN 0.4 MG SL tablet Commonly known as:  NITROSTAT Place 1 tablet (0.4 mg total) under the tongue every 5 (five) minutes as needed for chest pain.   omeprazole 10 MG capsule Commonly known as:  PRILOSEC Take 20 mg by mouth as needed.   PROBIOTIC PO Take 1 tablet by mouth daily.   spironolactone 50 MG tablet Commonly known as:  ALDACTONE Take 1 tablet by mouth as needed. For swelling    temazepam 30 MG capsule Commonly known as:  RESTORIL Take 30 mg by mouth at bedtime.   ursodiol 300 MG capsule Commonly known as:  ACTIGALL Take 600-900 mg by mouth 2 (two) times daily. 900 mg in am,  600 mg in pm      Call if recurrent pain  We added back your lasix to 20 mg daily.    Follow up with GI in next week.     Acute coronary syndrome (MI, NSTEMI, STEMI, etc) this admission?: No.    Outstanding Labs/Studies     Duration of Discharge Encounter   Greater than 30 minutes including physician time.  Signed, Cecilie Kicks, NP 11/06/2017, 11:43 AM

## 2017-11-09 DIAGNOSIS — R188 Other ascites: Secondary | ICD-10-CM | POA: Diagnosis not present

## 2017-11-09 DIAGNOSIS — K743 Primary biliary cirrhosis: Secondary | ICD-10-CM | POA: Diagnosis not present

## 2017-11-09 DIAGNOSIS — L309 Dermatitis, unspecified: Secondary | ICD-10-CM | POA: Diagnosis not present

## 2017-11-09 DIAGNOSIS — K648 Other hemorrhoids: Secondary | ICD-10-CM | POA: Diagnosis not present

## 2017-11-09 MED FILL — HYDROCORTISONE 2.5% LOTION: 2.5 | 20 days supply | Qty: 59 | Fill #0

## 2017-11-09 MED FILL — SPIRONOLACTONE 50 MG TABS: 50 | 90 days supply | Qty: 90 | Fill #0

## 2017-11-09 MED FILL — HYDROCORTISONE 2.5% CREAM: 2.5 | 10 days supply | Qty: 30 | Fill #0

## 2017-11-10 MED FILL — URSODIOL 300 MG CAPSULE: 300 | 90 days supply | Qty: 450 | Fill #0

## 2017-12-02 MED FILL — TEMAZEPAM 30 MG CAPSULE: 30 | 30 days supply | Qty: 30 | Fill #2

## 2017-12-10 ENCOUNTER — Ambulatory Visit
Admit: 2017-12-10 | Discharge: 2017-12-11 | Payer: PRIVATE HEALTH INSURANCE | Attending: Gastroenterology | Primary: Gastroenterology

## 2017-12-10 ENCOUNTER — Ambulatory Visit: Admit: 2017-12-10 | Discharge: 2017-12-11 | Payer: PRIVATE HEALTH INSURANCE

## 2017-12-10 DIAGNOSIS — K743 Primary biliary cirrhosis: Principal | ICD-10-CM

## 2017-12-10 DIAGNOSIS — K862 Cyst of pancreas: Secondary | ICD-10-CM | POA: Diagnosis not present

## 2017-12-10 DIAGNOSIS — R17 Unspecified jaundice: Secondary | ICD-10-CM | POA: Diagnosis not present

## 2017-12-10 DIAGNOSIS — R162 Hepatomegaly with splenomegaly, not elsewhere classified: Secondary | ICD-10-CM | POA: Diagnosis not present

## 2017-12-14 DIAGNOSIS — K743 Primary biliary cirrhosis: Secondary | ICD-10-CM | POA: Diagnosis not present

## 2017-12-15 DIAGNOSIS — Z23 Encounter for immunization: Secondary | ICD-10-CM | POA: Diagnosis not present

## 2017-12-15 DIAGNOSIS — L299 Pruritus, unspecified: Secondary | ICD-10-CM | POA: Diagnosis not present

## 2017-12-15 DIAGNOSIS — L309 Dermatitis, unspecified: Secondary | ICD-10-CM | POA: Diagnosis not present

## 2017-12-15 DIAGNOSIS — L920 Granuloma annulare: Secondary | ICD-10-CM | POA: Diagnosis not present

## 2017-12-15 DIAGNOSIS — L851 Acquired keratosis [keratoderma] palmaris et plantaris: Secondary | ICD-10-CM | POA: Diagnosis not present

## 2017-12-15 DIAGNOSIS — L710 Perioral dermatitis: Secondary | ICD-10-CM | POA: Diagnosis not present

## 2017-12-15 MED FILL — TRIAMCINOLONE 0.1% CREAM: 0.1 | 15 days supply | Qty: 60 | Fill #0

## 2017-12-15 MED FILL — metroNIDAZOLE 0.75 % CREA: 0.75 | 20 days supply | Qty: 45 | Fill #0

## 2017-12-16 DIAGNOSIS — K743 Primary biliary cirrhosis: Secondary | ICD-10-CM | POA: Diagnosis not present

## 2017-12-16 DIAGNOSIS — R945 Abnormal results of liver function studies: Secondary | ICD-10-CM | POA: Diagnosis not present

## 2017-12-17 ENCOUNTER — Encounter: Payer: Self-pay | Admitting: Internal Medicine

## 2017-12-17 ENCOUNTER — Ambulatory Visit (INDEPENDENT_AMBULATORY_CARE_PROVIDER_SITE_OTHER): Payer: 59 | Admitting: Internal Medicine

## 2017-12-17 VITALS — BP 100/56 | HR 79 | Ht 61.5 in | Wt 139.2 lb

## 2017-12-17 DIAGNOSIS — Z951 Presence of aortocoronary bypass graft: Secondary | ICD-10-CM | POA: Diagnosis not present

## 2017-12-17 DIAGNOSIS — E785 Hyperlipidemia, unspecified: Secondary | ICD-10-CM

## 2017-12-17 DIAGNOSIS — K743 Primary biliary cirrhosis: Secondary | ICD-10-CM

## 2017-12-17 DIAGNOSIS — E7849 Other hyperlipidemia: Secondary | ICD-10-CM | POA: Diagnosis not present

## 2017-12-17 NOTE — Patient Instructions (Addendum)
Medication Instructions:  Dr. Debara Pickett recommends Praluent 150mg /mL (PCSK9). This is an injectable cholesterol medication. This medication will need prior approval with your insurance company, which we will work on. If the medication is not approved initially, we may need to do an appeal with your insurance. We will keep you updated on this process. This medication can be provided at some local pharmacies or be shipped to you from a specialty pharmacy.   If you need a refill on your cardiac medications before your next appointment, please call your pharmacy.   Lab work: FASTING labs to check cholesterol NOW for insurance prior authorization  FASTING labs to recheck cholesterol in 3-4 months If you have labs (blood work) drawn today and your tests are completely normal, you will receive your results only by: Marland Kitchen MyChart Message (if you have MyChart) OR . A paper copy in the mail If you have any lab test that is abnormal or we need to change your treatment, we will call you to review the results.  Testing/Procedures: NONE  Follow-Up: At [facility], you and your health needs are our priority.  As part of our continuing mission to provide you with exceptional heart care, we have created designated Provider Care Teams.  These Care Teams include your primary Cardiologist (physician) and Advanced Practice Providers (APPs -  Physician Assistants and Nurse Practitioners) who all work together to provide you with the care you need, when you need it. . You will need a follow up appointment in 3-4 months with Dr. Debara Pickett (lipid clinic)  Any Other Special Instructions Will Be Listed Below (If Applicable).

## 2017-12-17 NOTE — Progress Notes (Signed)
OFFICE CONSULT NOTE  Chief Complaint:  Management dyslipidemia  Primary Care Physician: Scifres, Earlie Server, PA-C  HPI:  Christine Butler is a 47 y.o. female who is being seen today for the evaluation of dyslipidemia at the request of Dr. Prescott Gum / Revenkar. This is a pleasant 47 year old female who unfortunately underwent urgent coronary bypass surgery in January 2019 after complaining of symptoms concerning for unstable angina.  She was found to have two-vessel coronary disease and underwent LIMA to Diagonal and left free radial artery graft to the LAD with interposition at the proximal anastamosis).  Other medical problems include PBC, IBS and GERD.  She has recently been followed by Dr. [name], with hepatology and liver transplant service at [facility].  She has persistent jaundice with elevated liver enzymes.  There is also strong family history of heart disease.  Her father had two-vessel bypass at age 44.  She had a brother died of MI.  Her paternal grandfather and grandmother both had MIs in their 39s.  She also has an aunt who has multiple cardiac issues.  She does have one sister who has normal cholesterol.  In addition she has 3 children aged 16, 75 and 67, none of which you have had cholesterol testing.  Most recently her lipid profile showed a total cholesterol 449, HDL of 11, triglycerides 333 and LDL 371.  Obviously a very abnormal lipid profile concerning for HeFH.  12/17/2017  Christine Butler returns today for follow-up.  In the interim she has been seen by her hepatologist at [facility].  She underwent a fibro-scan of the liver which suggested cirrhosis.  She has follow-up with him fairly soon to discuss further work-up options but is contemplating liver transplant.  She says she has several friends and a sister who is considering a living related donor option if she were a Orthoptist.  This obviously has the potential to perhaps care her familial hyperlipidemia however in the interim  awaiting a possible transplant she has significant increased cardiovascular risk.  We again reviewed the options of treatment including a less hepatically metabolized statin, obviously there is still risk for worsening liver enzymes, or PCSK9 inhibitor therapy.  PCSK9 inhibitor therapy in the interim would be likely the best tolerated therapy with the most significant reduction in cholesterol.  Of course this would be at the blessing of her hepatologist who I have already spoken with on this and feels that it would be reasonable therapy to consider.  PMHx:  Past Medical History:  Diagnosis Date  . Arthritis    right knee  . Ascites--mild this admit 11/06/2017  . CAD (coronary artery disease)    a. s/p CABGx2 in 02/2017 (LAD not suitable for PCI), EF normal.  . Familial hyperlipidemia   . GERD (gastroesophageal reflux disease)   . IBS (irritable bowel syndrome)   . PONV (postoperative nausea and vomiting)   . Primary biliary cirrhosis ([place])   . S/P CABG (coronary artery bypass graft)   . SVD (spontaneous vaginal delivery)    x 3  . Urinary tract bacterial infections     Past Surgical History:  Procedure Laterality Date  . CORONARY ARTERY BYPASS GRAFT N/A 03/24/2017   Procedure: CORONARY ARTERY BYPASS GRAFTING (CABG) x two, using left internal mammary artery, left radial artery, and right leg greater saphenous vein harvested endoscopically;  Surgeon: Ivin Poot, MD;  Location: [place];  Service: Open Heart Surgery;  Laterality: N/A;  . ENDOVEIN HARVEST OF GREATER SAPHENOUS  VEIN Right 03/24/2017   Procedure: ENDOVEIN HARVEST OF GREATER SAPHENOUS VEIN;  Surgeon: Ivin Poot, MD;  Location: [facility];  Service: Open Heart Surgery;  Laterality: Right;  . INCONTINENCE SURGERY    . LEEP N/A 03/28/2014   Procedure: LOOP ELECTROSURGICAL EXCISION PROCEDURE (LEEP) cone biopsy;  Surgeon: Cyril Mourning, MD;  Location: [facility];  Service: Gynecology;  Laterality: N/A;  . LEFT HEART CATH AND CORONARY  ANGIOGRAPHY N/A 03/23/2017   Procedure: LEFT HEART CATH AND CORONARY ANGIOGRAPHY;  Surgeon: Jettie Booze, MD;  Location: [facility];  Service: Cardiovascular;  Laterality: N/A;  . LIVER BIOPSY     x 2  . RADIAL ARTERY HARVEST Left 03/24/2017   Procedure: RADIAL ARTERY HARVEST;  Surgeon: Ivin Poot, MD;  Location: [place];  Service: Open Heart Surgery;  Laterality: Left;  . TEE WITHOUT CARDIOVERSION N/A 03/24/2017   Procedure: TRANSESOPHAGEAL ECHOCARDIOGRAM (TEE);  Surgeon: Prescott Gum, Collier Salina, MD;  Location: [place];  Service: Open Heart Surgery;  Laterality: N/A;  . WISDOM TOOTH EXTRACTION      FAMHx:  Family History  Problem Relation Age of Onset  . CAD Father   . Diabetes Mellitus II Father   . Heart disease Father   . Heart attack Brother   . Heart disease Maternal Aunt   . Heart attack Paternal Grandmother   . Heart attack Paternal Grandfather     SOCHx:   reports that she has never smoked. She has never used smokeless tobacco. She reports that she drinks about 1.0 standard drinks of alcohol per week. She reports that she does not use drugs.  ALLERGIES:  Allergies  Allergen Reactions  . Codeine Nausea And Vomiting  . Erythromycin Nausea And Vomiting  . Fentanyl Nausea And Vomiting    ROS: Pertinent items noted in HPI and remainder of comprehensive ROS otherwise negative.  HOME MEDS: Current Outpatient Medications on File Prior to Visit  Medication Sig Dispense Refill  . aspirin EC 81 MG tablet Take 1 tablet (81 mg total) by mouth daily.    . cycloSPORINE (RESTASIS) 0.05 % ophthalmic emulsion Place 1 drop into both eyes daily.    . diphenhydrAMINE (BENADRYL) 25 mg capsule Take 50 mg by mouth at bedtime as needed for itching.    . furosemide (LASIX) 20 MG tablet Take 1 tablet (20 mg total) by mouth daily. 30 tablet 6  . hydrOXYzine (ATARAX/VISTARIL) 25 MG tablet Take 25 mg by mouth every 6 (six) hours as needed (unk).     Marland Kitchen ibuprofen (ADVIL,MOTRIN) 600 MG  tablet Take 1 tablet (600 mg total) by mouth every 6 (six) hours as needed for headache, mild pain or moderate pain. 30 tablet 0  . metoprolol tartrate (LOPRESSOR) 25 MG tablet Take 0.5 tablets (12.5 mg total) by mouth 2 (two) times daily. 90 tablet 1  . Multiple Vitamins-Minerals (DEKAS PLUS PO) Take 1 capsule by mouth daily.    Marland Kitchen omeprazole (PRILOSEC) 10 MG capsule Take 20 mg by mouth as needed.     . Probiotic Product (PROBIOTIC PO) Take 1 tablet by mouth daily.    Marland Kitchen spironolactone (ALDACTONE) 50 MG tablet Take 1 tablet by mouth as needed. For swelling  1  . temazepam (RESTORIL) 30 MG capsule Take 30 mg by mouth at bedtime.    . ursodiol (ACTIGALL) 300 MG capsule Take 600-900 mg by mouth 2 (two) times daily. 900 mg in am,  600 mg in pm    . nitroGLYCERIN (NITROSTAT)  0.4 MG SL tablet Place 1 tablet (0.4 mg total) under the tongue every 5 (five) minutes as needed for chest pain. 25 tablet 6   No current facility-administered medications on file prior to visit.     LABS/IMAGING: No results found for this or any previous visit (from the past 48 hour(s)). No results found.  LIPID PANEL:    Component Value Date/Time   CHOL 406 (H) 03/23/2017 1456   TRIG 309 (H) 03/23/2017 1456   HDL 10 (L) 03/23/2017 1456   CHOLHDL 40.6 03/23/2017 1456   VLDL 62 (H) 03/23/2017 1456   LDLCALC 334 (H) 03/23/2017 1456    WEIGHTS: Wt Readings from Last 3 Encounters:  12/17/17 139 lb 3.2 oz (63.1 kg)  11/05/17 144 lb 8 oz (65.5 kg)  10/12/17 141 lb (64 kg)    VITALS: BP (!) 100/56   Pulse 79   Ht 5' 1.5" (1.562 m)   Wt 139 lb 3.2 oz (63.1 kg)   SpO2 99%   BMI 25.88 kg/m   EXAM: Deferred  EKG: Deferred  ASSESSMENT: 1. 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 ASCVD with recent two-vessel CABG (02/2017) 3. Strong family history of premature coronary artery disease 4. PBC, with cirrhosis and ongoing jaundice (followed by Dr. [name] at [facility])  PLAN: 1.   Ms. Dowty was  shown to have significant cirrhosis of the liver based on her recent FibroScan and is possibly a candidate for transplant.  After discussing options with her hepatologist, the best route for treatment in the short-term would be PCSK9 inhibitor therapy.  Plan to start Praluent 150 mg q2 weeks. We will certainly need to monitor liver enzymes on this which is done routinely in their office and I will defer to her hepatologist for this.  I expect improvement in her cholesterol numbers, which hopefully will reduce her short-term risk.  If she did undergo transplant, this potentially could be curative for her if there is FH provided her liver as planned donor has normal lipid metabolism genetics.  Plan follow-up in 3 to 4 months with a repeat lipid profile at that time.  Pixie Casino, MD, [facility], [place] Director of the Advanced Lipid Disorders &  Cardiovascular Risk Reduction Clinic Diplomate of the American Board of Clinical Lipidology Attending Cardiologist  Direct Dial: [phone]  Fax: [phone]  Website:  [email or web address] 12/17/2017, 10:17 AM

## 2017-12-18 ENCOUNTER — Other Ambulatory Visit (HOSPITAL_COMMUNITY)
Admission: AD | Admit: 2017-12-18 | Discharge: 2017-12-18 | Disposition: A | Discharge status: Home / Self Care | Payer: 59 | Source: Ambulatory Visit | Attending: Internal Medicine | Admitting: Internal Medicine

## 2017-12-18 DIAGNOSIS — E785 Hyperlipidemia, unspecified: Secondary | ICD-10-CM | POA: Insufficient documentation

## 2017-12-18 LAB — LIPID PANEL
Cholesterol: 357 mg/dL — ABNORMAL HIGH (ref 0–200)
TRIGLYCERIDES: 188 mg/dL — AB (ref <150)
VLDL: 38 mg/dL (ref 0–40)

## 2017-12-21 NOTE — Addendum Note (Signed)
Addended by: Fidel Levy on: 12/21/2017 01:46 PM   Modules accepted: Orders

## 2017-12-24 DIAGNOSIS — K743 Primary biliary cirrhosis: Secondary | ICD-10-CM | POA: Diagnosis not present

## 2017-12-24 DIAGNOSIS — Z87898 Personal history of other specified conditions: Secondary | ICD-10-CM | POA: Diagnosis not present

## 2017-12-24 DIAGNOSIS — A630 Anogenital (venereal) warts: Secondary | ICD-10-CM | POA: Diagnosis not present

## 2017-12-24 DIAGNOSIS — K625 Hemorrhage of anus and rectum: Secondary | ICD-10-CM | POA: Diagnosis not present

## 2017-12-24 MED FILL — HYDROCORTISONE ACETATE 25 M: 25 | 14 days supply | Qty: 28 | Fill #0

## 2017-12-24 MED FILL — FUROSEMIDE 20 MG TABS: 20 | 90 days supply | Qty: 90 | Fill #0

## 2017-12-25 NOTE — Addendum Note (Signed)
Addended by: Fidel Levy on: 12/25/2017 11:18 AM   Modules accepted: Orders

## 2017-12-28 ENCOUNTER — Ambulatory Visit
Admit: 2017-12-28 | Discharge: 2017-12-29 | Payer: PRIVATE HEALTH INSURANCE | Attending: Gastroenterology | Primary: Gastroenterology

## 2017-12-28 DIAGNOSIS — K743 Primary biliary cirrhosis: Principal | ICD-10-CM

## 2017-12-28 DIAGNOSIS — B078 Other viral warts: Secondary | ICD-10-CM | POA: Diagnosis not present

## 2017-12-28 DIAGNOSIS — Z23 Encounter for immunization: Secondary | ICD-10-CM | POA: Diagnosis not present

## 2017-12-28 DIAGNOSIS — D485 Neoplasm of uncertain behavior of skin: Secondary | ICD-10-CM | POA: Diagnosis not present

## 2017-12-28 DIAGNOSIS — Z4802 Encounter for removal of sutures: Secondary | ICD-10-CM | POA: Diagnosis not present

## 2017-12-28 MED ORDER — NALTREXONE 50 MG TABLET
ORAL_TABLET | Freq: Every day | ORAL | 11 refills | 0.00000 days | Status: CP
Start: 2017-12-28 — End: 2018-09-01

## 2017-12-28 MED FILL — NALTREXONE 50 MG TABLET: 50 | 30 days supply | Qty: 30 | Fill #0

## 2017-12-29 ENCOUNTER — Telehealth: Payer: Self-pay | Admitting: Internal Medicine

## 2017-12-29 NOTE — Telephone Encounter (Signed)
Received records from [facility] on 12/29/17, Appt 03/18/18 @ 8:00AM. [place]

## 2017-12-30 DIAGNOSIS — E785 Hyperlipidemia, unspecified: Secondary | ICD-10-CM | POA: Diagnosis not present

## 2017-12-30 DIAGNOSIS — K743 Primary biliary cirrhosis: Secondary | ICD-10-CM | POA: Diagnosis not present

## 2017-12-31 LAB — LIPID PANEL
Chol/HDL Ratio: 27.4 ratio — ABNORMAL HIGH (ref 0.0–4.4)
Cholesterol, Total: 384 mg/dL — ABNORMAL HIGH (ref 100–199)
HDL: 14 mg/dL — ABNORMAL LOW (ref >39)
LDL CALC: 331 mg/dL — AB (ref 0–99)
Triglycerides: 195 mg/dL — ABNORMAL HIGH (ref 0–149)
VLDL Cholesterol Cal: 39 mg/dL (ref 5–40)

## 2017-12-31 LAB — LDL CHOLESTEROL, DIRECT: LDL DIRECT: 229 mg/dL — AB (ref 0–99)

## 2017-12-31 MED FILL — TEMAZEPAM 30 MG CAPSULE: 30 | 30 days supply | Qty: 30 | Fill #3

## 2017-12-31 MED FILL — METOPROLOL TARTRATE 25 MG T: 25 | 90 days supply | Qty: 90 | Fill #1

## 2018-01-05 ENCOUNTER — Telehealth: Payer: Self-pay | Admitting: Internal Medicine

## 2018-01-05 NOTE — Telephone Encounter (Signed)
PA for Praluent 150mg /mL submitted via [email or web address] to [employer]  Key: [place] Case ID: [id number]

## 2018-01-08 MED ORDER — ALIROCUMAB 150 MG/ML ~~LOC~~ SOAJ: 1.0000 | SUBCUTANEOUS | 11 refills | Status: DC

## 2018-01-08 MED FILL — PRALUENT 150 MG/ML PEN: 150 | 28 days supply | Qty: 2 | Fill #0

## 2018-01-08 NOTE — Addendum Note (Signed)
Addended by: Fidel Levy on: 01/08/2018 09:54 AM   Modules accepted: Orders

## 2018-01-08 NOTE — Telephone Encounter (Signed)
Approved on November 14  The request has been approved. The authorization is effective for a maximum of 12 fills from 01/07/2018 to 01/07/2019, as long as the member is enrolled in their current health plan. The request was reviewed and approved by a licensed clinical pharmacist. This request is approved for 2 pens per 28 days. This medication must be filled and/or managed by a [facility] outpatient pharmacy. Please call [phone]. A written notification letter will follow with additional details.  Patient notified via [place]

## 2018-01-28 DIAGNOSIS — R87612 Low grade squamous intraepithelial lesion on cytologic smear of cervix (LGSIL): Secondary | ICD-10-CM | POA: Diagnosis not present

## 2018-02-01 ENCOUNTER — Inpatient Hospital Stay (HOSPITAL_COMMUNITY): Payer: 59 | Admitting: Certified Registered"

## 2018-02-01 ENCOUNTER — Encounter (HOSPITAL_COMMUNITY): Payer: Self-pay

## 2018-02-01 ENCOUNTER — Encounter (HOSPITAL_COMMUNITY)
Admission: EM | Disposition: A | Discharge status: Home / Self Care | Payer: Self-pay | Source: Home / Self Care | Attending: Internal Medicine

## 2018-02-01 ENCOUNTER — Other Ambulatory Visit: Payer: Self-pay

## 2018-02-01 ENCOUNTER — Inpatient Hospital Stay (HOSPITAL_COMMUNITY)
Admission: EM | Admit: 2018-02-01 | Discharge: 2018-02-03 | DRG: 369 | Disposition: A | Discharge status: Home / Self Care | Payer: 59 | Attending: Internal Medicine | Admitting: Internal Medicine

## 2018-02-01 DIAGNOSIS — K920 Hematemesis: Secondary | ICD-10-CM | POA: Diagnosis not present

## 2018-02-01 DIAGNOSIS — F419 Anxiety disorder, unspecified: Secondary | ICD-10-CM | POA: Diagnosis present

## 2018-02-01 DIAGNOSIS — K922 Gastrointestinal hemorrhage, unspecified: Secondary | ICD-10-CM

## 2018-02-01 DIAGNOSIS — R42 Dizziness and giddiness: Secondary | ICD-10-CM | POA: Diagnosis not present

## 2018-02-01 DIAGNOSIS — R000 Tachycardia, unspecified: Secondary | ICD-10-CM | POA: Diagnosis present

## 2018-02-01 DIAGNOSIS — D62 Acute posthemorrhagic anemia: Secondary | ICD-10-CM | POA: Diagnosis not present

## 2018-02-01 DIAGNOSIS — I951 Orthostatic hypotension: Secondary | ICD-10-CM | POA: Diagnosis present

## 2018-02-01 DIAGNOSIS — I8500 Esophageal varices without bleeding: Secondary | ICD-10-CM | POA: Diagnosis not present

## 2018-02-01 DIAGNOSIS — I864 Gastric varices: Secondary | ICD-10-CM | POA: Diagnosis present

## 2018-02-01 DIAGNOSIS — K7460 Unspecified cirrhosis of liver: Secondary | ICD-10-CM

## 2018-02-01 DIAGNOSIS — Z881 Allergy status to other antibiotic agents status: Secondary | ICD-10-CM | POA: Diagnosis not present

## 2018-02-01 DIAGNOSIS — K219 Gastro-esophageal reflux disease without esophagitis: Secondary | ICD-10-CM | POA: Diagnosis not present

## 2018-02-01 DIAGNOSIS — R1013 Epigastric pain: Secondary | ICD-10-CM | POA: Diagnosis not present

## 2018-02-01 DIAGNOSIS — Z951 Presence of aortocoronary bypass graft: Secondary | ICD-10-CM | POA: Diagnosis not present

## 2018-02-01 DIAGNOSIS — R188 Other ascites: Secondary | ICD-10-CM | POA: Diagnosis present

## 2018-02-01 DIAGNOSIS — I10 Essential (primary) hypertension: Secondary | ICD-10-CM | POA: Diagnosis present

## 2018-02-01 DIAGNOSIS — Z7682 Awaiting organ transplant status: Secondary | ICD-10-CM | POA: Diagnosis not present

## 2018-02-01 DIAGNOSIS — I2510 Atherosclerotic heart disease of native coronary artery without angina pectoris: Secondary | ICD-10-CM | POA: Diagnosis present

## 2018-02-01 DIAGNOSIS — K589 Irritable bowel syndrome without diarrhea: Secondary | ICD-10-CM | POA: Diagnosis present

## 2018-02-01 DIAGNOSIS — M1711 Unilateral primary osteoarthritis, right knee: Secondary | ICD-10-CM | POA: Diagnosis present

## 2018-02-01 DIAGNOSIS — I8501 Esophageal varices with bleeding: Secondary | ICD-10-CM | POA: Diagnosis present

## 2018-02-01 DIAGNOSIS — K921 Melena: Secondary | ICD-10-CM | POA: Diagnosis not present

## 2018-02-01 DIAGNOSIS — Z79899 Other long term (current) drug therapy: Secondary | ICD-10-CM | POA: Diagnosis not present

## 2018-02-01 DIAGNOSIS — Z885 Allergy status to narcotic agent status: Secondary | ICD-10-CM

## 2018-02-01 DIAGNOSIS — K2970 Gastritis, unspecified, without bleeding: Secondary | ICD-10-CM | POA: Diagnosis present

## 2018-02-01 DIAGNOSIS — R140 Abdominal distension (gaseous): Secondary | ICD-10-CM

## 2018-02-01 DIAGNOSIS — K743 Primary biliary cirrhosis: Secondary | ICD-10-CM | POA: Diagnosis present

## 2018-02-01 DIAGNOSIS — K745 Biliary cirrhosis, unspecified: Secondary | ICD-10-CM | POA: Diagnosis not present

## 2018-02-01 DIAGNOSIS — E7849 Other hyperlipidemia: Secondary | ICD-10-CM | POA: Diagnosis present

## 2018-02-01 DIAGNOSIS — Z7982 Long term (current) use of aspirin: Secondary | ICD-10-CM | POA: Diagnosis not present

## 2018-02-01 HISTORY — PX: ESOPHAGOGASTRODUODENOSCOPY (EGD) WITH PROPOFOL: SHX5813

## 2018-02-01 HISTORY — PX: ESOPHAGEAL BANDING: SHX5518

## 2018-02-01 HISTORY — DX: Gastrointestinal hemorrhage, unspecified: K92.2

## 2018-02-01 LAB — PREPARE RBC (CROSSMATCH)

## 2018-02-01 LAB — COMPREHENSIVE METABOLIC PANEL
ALT: 37 U/L (ref 0–44)
AST: 75 U/L — AB (ref 15–41)
Albumin: 2.1 g/dL — ABNORMAL LOW (ref 3.5–5.0)
Alkaline Phosphatase: 213 U/L — ABNORMAL HIGH (ref 38–126)
Anion gap: 8 (ref 5–15)
BUN: 18 mg/dL (ref 6–20)
CHLORIDE: 103 mmol/L (ref 98–111)
CO2: 24 mmol/L (ref 22–32)
CREATININE: 0.48 mg/dL (ref 0.44–1.00)
Calcium: 8.1 mg/dL — ABNORMAL LOW (ref 8.9–10.3)
GFR calc non Af Amer: >60 mL/min (ref >60)
Glucose, Bld: 100 mg/dL — ABNORMAL HIGH (ref 70–99)
POTASSIUM: 3.9 mmol/L (ref 3.5–5.1)
SODIUM: 135 mmol/L (ref 135–145)
Total Bilirubin: 6.8 mg/dL — ABNORMAL HIGH (ref 0.3–1.2)
Total Protein: 5.6 g/dL — ABNORMAL LOW (ref 6.5–8.1)

## 2018-02-01 LAB — URINALYSIS, ROUTINE W REFLEX MICROSCOPIC
GLUCOSE, UA: NEGATIVE mg/dL
Hgb urine dipstick: NEGATIVE
KETONES UR: NEGATIVE mg/dL
LEUKOCYTES UA: NEGATIVE
NITRITE: NEGATIVE
PH: 6 (ref 5.0–8.0)
Protein, ur: NEGATIVE mg/dL
SPECIFIC GRAVITY, URINE: 1.019 (ref 1.005–1.030)

## 2018-02-01 LAB — CBC
HCT: 29.6 % — ABNORMAL LOW (ref 36.0–46.0)
HEMOGLOBIN: 9.6 g/dL — AB (ref 12.0–15.0)
MCH: 30.5 pg (ref 26.0–34.0)
MCHC: 32.4 g/dL (ref 30.0–36.0)
MCV: 94 fL (ref 80.0–100.0)
NRBC: 0 % (ref 0.0–0.2)
Platelets: 153 10*3/uL (ref 150–400)
RBC: 3.15 MIL/uL — ABNORMAL LOW (ref 3.87–5.11)
RDW: 17 % — AB (ref 11.5–15.5)
WBC: 5.2 10*3/uL (ref 4.0–10.5)

## 2018-02-01 LAB — ABO/RH: ABO/RH(D): A POS

## 2018-02-01 LAB — I-STAT BETA HCG BLOOD, ED (MC, WL, AP ONLY): I-stat hCG, quantitative: <5 m[IU]/mL (ref <5)

## 2018-02-01 LAB — PROTIME-INR
INR: 1.19
PROTHROMBIN TIME: 15 s (ref 11.4–15.2)

## 2018-02-01 LAB — LIPASE, BLOOD: LIPASE: 38 U/L (ref 11–51)

## 2018-02-01 SURGERY — ESOPHAGOGASTRODUODENOSCOPY (EGD) WITH PROPOFOL
Anesthesia: Monitor Anesthesia Care

## 2018-02-01 MED ORDER — ONDANSETRON HCL 4 MG/2ML IJ SOLN
4.0000 mg | Freq: Four times a day (QID) | INTRAMUSCULAR | Status: DC | PRN
  Administered 2018-02-01: 4 mg via INTRAVENOUS

## 2018-02-01 MED ORDER — SODIUM CHLORIDE 0.9% IV SOLUTION: Freq: Once | INTRAVENOUS | Status: DC

## 2018-02-01 MED ORDER — ONDANSETRON HCL 4 MG PO TABS: 4.0000 mg | ORAL_TABLET | Freq: Four times a day (QID) | ORAL | Status: DC | PRN

## 2018-02-01 MED ORDER — PROPOFOL 10 MG/ML IV BOLUS
INTRAVENOUS | Status: DC | PRN
  Administered 2018-02-01: 40 mg via INTRAVENOUS
  Administered 2018-02-01: 4 mg via INTRAVENOUS

## 2018-02-01 MED ORDER — URSODIOL 300 MG PO CAPS: 600.0000 mg | ORAL_CAPSULE | ORAL | Status: DC

## 2018-02-01 MED ORDER — PROMETHAZINE HCL 25 MG/ML IJ SOLN: 6.2500 mg | Freq: Four times a day (QID) | INTRAMUSCULAR | Status: DC | PRN

## 2018-02-01 MED ORDER — SODIUM CHLORIDE 0.9 % IV SOLN
50.0000 ug/h | INTRAVENOUS | Status: DC
  Administered 2018-02-01 – 2018-02-03 (×4): 50 ug/h via INTRAVENOUS
  Filled 2018-02-01 (×8): qty 1

## 2018-02-01 MED ORDER — PROMETHAZINE HCL 25 MG/ML IJ SOLN
25.0000 mg | Freq: Four times a day (QID) | INTRAMUSCULAR | Status: DC | PRN
  Administered 2018-02-01: 25 mg via INTRAVENOUS
  Filled 2018-02-01: qty 1

## 2018-02-01 MED ORDER — LACTATED RINGERS IV SOLN
INTRAVENOUS | Status: DC
  Administered 2018-02-01: 1000 mL via INTRAVENOUS

## 2018-02-01 MED ORDER — CYCLOSPORINE 0.05 % OP EMUL
1.0000 [drp] | Freq: Every day | OPHTHALMIC | Status: DC
  Administered 2018-02-02 – 2018-02-03 (×2): 1 [drp] via OPHTHALMIC
  Filled 2018-02-01 (×2): qty 1

## 2018-02-01 MED ORDER — PROPOFOL 500 MG/50ML IV EMUL
INTRAVENOUS | Status: DC | PRN
  Administered 2018-02-01: 200 ug/kg/min via INTRAVENOUS

## 2018-02-01 MED ORDER — TEMAZEPAM 15 MG PO CAPS
30.0000 mg | ORAL_CAPSULE | Freq: Every day | ORAL | Status: DC
  Administered 2018-02-01 – 2018-02-02 (×2): 30 mg via ORAL
  Filled 2018-02-01 (×2): qty 2

## 2018-02-01 MED ORDER — ONDANSETRON HCL 4 MG/2ML IJ SOLN
4.0000 mg | Freq: Once | INTRAMUSCULAR | Status: AC
  Administered 2018-02-01: 4 mg via INTRAVENOUS
  Filled 2018-02-01: qty 2

## 2018-02-01 MED ORDER — SODIUM CHLORIDE 0.9 % IV SOLN
8.0000 mg/h | INTRAVENOUS | Status: DC
  Administered 2018-02-01 – 2018-02-03 (×5): 8 mg/h via INTRAVENOUS
  Filled 2018-02-01 (×8): qty 80

## 2018-02-01 MED ORDER — URSODIOL 300 MG PO CAPS
900.0000 mg | ORAL_CAPSULE | Freq: Every day | ORAL | Status: DC
  Administered 2018-02-02 – 2018-02-03 (×2): 900 mg via ORAL
  Filled 2018-02-01 (×2): qty 3

## 2018-02-01 MED ORDER — PHENYLEPHRINE 40 MCG/ML (10ML) SYRINGE FOR IV PUSH (FOR BLOOD PRESSURE SUPPORT)
PREFILLED_SYRINGE | INTRAVENOUS | Status: DC | PRN
  Administered 2018-02-01: 80 ug via INTRAVENOUS
  Administered 2018-02-01: 120 ug via INTRAVENOUS
  Administered 2018-02-01 (×3): 80 ug via INTRAVENOUS
  Administered 2018-02-01: 120 ug via INTRAVENOUS
  Administered 2018-02-01 (×2): 80 ug via INTRAVENOUS
  Administered 2018-02-01: 120 ug via INTRAVENOUS
  Administered 2018-02-01 (×2): 80 ug via INTRAVENOUS

## 2018-02-01 MED ORDER — HYDROMORPHONE HCL 1 MG/ML IJ SOLN: 1.0000 mg | INTRAMUSCULAR | Status: DC | PRN

## 2018-02-01 MED ORDER — SODIUM CHLORIDE 0.9 % IV SOLN: INTRAVENOUS | Status: DC

## 2018-02-01 MED ORDER — ACETAMINOPHEN 325 MG PO TABS: 650.0000 mg | ORAL_TABLET | Freq: Four times a day (QID) | ORAL | Status: DC | PRN

## 2018-02-01 MED ORDER — SODIUM CHLORIDE 0.9 % IV SOLN
INTRAVENOUS | Status: DC
  Administered 2018-02-01 – 2018-02-02 (×4): via INTRAVENOUS

## 2018-02-01 MED ORDER — PROMETHAZINE HCL 25 MG/ML IJ SOLN: 12.5000 mg | Freq: Once | INTRAMUSCULAR | Status: DC

## 2018-02-01 MED ORDER — URSODIOL 300 MG PO CAPS
600.0000 mg | ORAL_CAPSULE | Freq: Every day | ORAL | Status: DC
  Administered 2018-02-01 – 2018-02-02 (×2): 600 mg via ORAL
  Filled 2018-02-01 (×2): qty 2

## 2018-02-01 MED ORDER — NITROGLYCERIN 0.4 MG SL SUBL: 0.4000 mg | SUBLINGUAL_TABLET | SUBLINGUAL | Status: DC | PRN

## 2018-02-01 MED ORDER — ONDANSETRON HCL 4 MG/2ML IJ SOLN
4.0000 mg | Freq: Four times a day (QID) | INTRAMUSCULAR | Status: DC | PRN
  Administered 2018-02-01: 4 mg via INTRAVENOUS
  Filled 2018-02-01: qty 2

## 2018-02-01 MED ORDER — ACETAMINOPHEN 650 MG RE SUPP: 650.0000 mg | Freq: Four times a day (QID) | RECTAL | Status: DC | PRN

## 2018-02-01 MED ORDER — OCTREOTIDE LOAD VIA INFUSION
50.0000 ug | Freq: Once | INTRAVENOUS | Status: AC
  Administered 2018-02-01: 50 ug via INTRAVENOUS
  Filled 2018-02-01: qty 25

## 2018-02-01 MED ORDER — SODIUM CHLORIDE 0.9 % IV BOLUS
500.0000 mL | Freq: Once | INTRAVENOUS | Status: AC
  Administered 2018-02-01: 500 mL via INTRAVENOUS

## 2018-02-01 MED ORDER — HYDROXYZINE HCL 25 MG PO TABS: 25.0000 mg | ORAL_TABLET | Freq: Four times a day (QID) | ORAL | Status: DC | PRN

## 2018-02-01 MED ORDER — SODIUM CHLORIDE 0.9 % IV SOLN
80.0000 mg | Freq: Once | INTRAVENOUS | Status: AC
  Administered 2018-02-01: 80 mg via INTRAVENOUS
  Filled 2018-02-01: qty 80

## 2018-02-01 MED ORDER — MORPHINE SULFATE (PF) 2 MG/ML IV SOLN: 1.0000 mg | INTRAVENOUS | Status: DC | PRN

## 2018-02-01 MED ORDER — LORAZEPAM 2 MG/ML IJ SOLN
0.5000 mg | Freq: Once | INTRAMUSCULAR | Status: AC
  Administered 2018-02-01: 0.5 mg via INTRAVENOUS
  Filled 2018-02-01: qty 1

## 2018-02-01 SURGICAL SUPPLY — 14 items

## 2018-02-01 NOTE — Progress Notes (Signed)
Case discussed with ER PA in her office endoscopy from a year ago was reviewed with just a patch of gastritis biopsies negative varices and either myself or Dr. Therisa Doyne who knows the patient will proceed with endoscopy later today with further work-up and plans pending those findings and her labs and previous CT were reviewed as well and overall her labs are pretty stable from previous draws and she is awaiting transplant evaluation at [facility]

## 2018-02-01 NOTE — ED Triage Notes (Signed)
Pt reports one large episode of hematemesis around 3a. Pt is in the process of getting evaluated for a liver transplant. She states that she is always a little jaundice, but today is worse. She reports checking her BP at home and it was 80s/50s. Denies current nausea or new pain. A&Ox4. Ambulatory.

## 2018-02-01 NOTE — Op Note (Signed)
[facility] Patient Name: Christine Butler Procedure Date: 02/01/2018 MRN: [id number] Attending MD: [name] , MD Date of Birth: January 12, 1971 CSN: [id number] Age: 47 Admit Type: Inpatient Procedure:                Upper GI endoscopy Indications:              Hematemesis Providers:                [name], MD, Zenon Mayo, RN, Alan Mulder,                            Technician, Caryl Pina CRNA, magod Referring MD:              Medicines:                Propofol per Anesthesia, Propofol total dose 280 mg                            IV, 60 mg IV lidocaine Complications:            No immediate complications. Estimated Blood Loss:     Estimated blood loss: none. Procedure:                Pre-Anesthesia Assessment:                           - Prior to the procedure, a History and Physical                            was performed, and patient medications and                            allergies were reviewed. The patient's tolerance of                            previous anesthesia was also reviewed. The risks                            and benefits of the procedure and the sedation                            options and risks were discussed with the patient.                            All questions were answered, and informed consent                            was obtained. Prior Anticoagulants: The patient has                            taken aspirin, last dose was 1 day prior to                            procedure. ASA Grade Assessment: III - A patient  with severe systemic disease. After reviewing the                            risks and benefits, the patient was deemed in                            satisfactory condition to undergo the procedure.                           After obtaining informed consent, the endoscope was                            passed under direct vision. Throughout the                            procedure, the  patient's blood pressure, pulse, and                            oxygen saturations were monitored continuously. The                            GIF-H190 ([id number] Olympus adult endoscope was                            introduced through the mouth, and advanced to the                            second part of duodenum. The upper GI endoscopy was                            accomplished without difficulty. The patient                            tolerated the procedure well. Scope In: Scope Out: Findings:      Grade I varices were found in the lower third of the esophagus and at       the gastroesophageal junction. They were small in size. But there seemed       to be a white nipple sign on 1 of the GE junction varices which       spontaneously started oozing during the procedure but did stop on its       own just before the bands were placed Four bands were successfully       placed with No obvious residual varices. There was no bleeding at the       end of the procedure.      Localized mild inflammation characterized by erythema was found in the       gastric antrum.      The duodenal bulb, first portion of the duodenum and second portion of       the duodenum were normal.      The exam was otherwise without abnormality. Impression:               - Grade I esophageal varices. Probable source of  bleeding 1 with white nipple sign as above which                            spontaneously started and stopped bleeding during                            the procedure banded.                           - Gastritis.                           - Normal duodenal bulb, first portion of the                            duodenum and second portion of the duodenum.                           - The examination was otherwise normal.                           - No specimens collected. Moderate Sedation:      Not Applicable - Patient had care per Anesthesia. Recommendation:           -  Clear liquid diet today. Continue octreotide and                            Protonix for now                           - Continue present medications.                           - Return to GI clinic in 2 weeks. Consider repeat                            banding in few weeks                           - Telephone GI clinic if symptomatic PRN. Need to                            reevaluate aspirin use Procedure Code(s):        --- Professional ---                           [phone], Esophagogastroduodenoscopy, flexible,                            transoral; with band ligation of esophageal/gastric                            varices Diagnosis Code(s):        --- Professional ---                           I85.00, Esophageal varices  without bleeding                           K29.70, Gastritis, unspecified, without bleeding                           K92.0, Hematemesis CPT copyright 2018 [employer] Association. All rights reserved. The codes documented in this report are preliminary and upon coder review may  be revised to meet current compliance requirements. [name], MD 02/01/2018 3:07:08 PM This report has been signed electronically. Number of Addenda: 0

## 2018-02-01 NOTE — Progress Notes (Signed)
Patient has had two BMs since arriving to the unit after EGD today. First BM was large and formed. It was brown with some black and red noted in water when the commode was flushed. The second BM was medium and formed and was black with a few more red streaks/ red water when flushing. No overt bright red blood noted. Patient educated to continue to save all BMs for RN to evaluate. Will continue to monitor for bleeding.

## 2018-02-01 NOTE — ED Notes (Signed)
Bed: CV89 Expected date:  Expected time:  Means of arrival:  Comments: Simona Huh

## 2018-02-01 NOTE — Progress Notes (Signed)
Christine Butler 2:03 PM  Subjective: Patient seen and examined and discussed with my partner Dr. Therisa Doyne and the anesthesia team as well her hospital computer chart reviewed and case discussed with the ER PA earlier today and she has not had any further signs of bleeding but is on an aspirin a day and only periodically uses Prilosec OTC and her case discussed with her ex-husband as well  Objective: Vital signs stable afebrile exam please see previous assessment evaluation labs reviewed  Assessment: Upper GI bleeding in a patient with cirrhosis on one aspirin a day  Plan: Okay to proceed with endoscopy today with anesthesia assistance  [facility]  Pager [phone] After 5PM or if no answer call [phone]

## 2018-02-01 NOTE — ED Provider Notes (Signed)
[facility] DEPT Provider Note   CSN: [id number] Arrival date & time: 02/01/18  0436     History   Chief Complaint Chief Complaint  Patient presents with  . Hematemesis    HPI Christine Butler is a 47 y.o. female.  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  Patient states she woke up suddenly around 3 AM and had a "copious amount "of blood in her vomit.  She took a picture of it and showed me this picture which did show a large amount of dark red blood with clots.  No obvious coffee-ground emesis noted.  She has had no continued episodes of vomiting however she noted at home her blood pressures were 86/76 systolic.  She does have a normally low blood pressure 720 systolic.  She also endorses a gnawing pain in that epigastric area that she states feels like reflux.  She has had more frequent episodes of acid reflux over the past week.  She intermittently takes a PPI for this.  Denies any abdominal distention.  Denies any diarrhea, constipation or grossly bloody stools.  States that she has had intermittent bright red streaks of blood in her stool for some time however she attributes this to possible fissures versus hemorrhoids and is planning to get colonoscopy next week with her gastroenterologist.  States she had an EGD 1 year ago which did not show any evidence of esophageal varices and she states that it was normal.  She denies any chronic NSAID use or heavy NSAID use.  Denies drinking any alcohol.  Denies any chest pain or shortness of breath.  Does endorse feeling lightheaded.  She also feels that she looks more jaundiced than normal.  She has never had any symptoms like this before.  Past Medical History:  Diagnosis Date  . Arthritis    right knee  . Ascites--mild  this admit 11/06/2017  . CAD (coronary artery disease)    a. s/p CABGx2 in 02/2017 (LAD not suitable for PCI), EF normal.  . Familial hyperlipidemia   . GERD (gastroesophageal reflux disease)   . IBS (irritable bowel syndrome)   . PONV (postoperative nausea and vomiting)   . Primary biliary cirrhosis ([place])   . S/P CABG (coronary artery bypass graft)   . SVD (spontaneous vaginal delivery)    x 3  . Urinary tract bacterial infections     Patient Active Problem List   Diagnosis Date Noted  . Upper GI bleed 02/01/2018  . Ascites--mild this admit 11/06/2017  . Atypical chest pain 11/05/2017  . Dyslipidemia, goal LDL below 70 10/13/2017  . Acute blood loss anemia 04/20/2017  . Elevated LFTs 04/20/2017  . Coronary artery disease 03/24/2017  . S/P CABG x 2 03/24/2017  . CAD (coronary artery disease), native coronary artery 03/23/2017  . 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 GERD (gastroesophageal reflux disease) 01/28/2012  . Primary biliary cirrhosis ([place]) 01/28/2012  . IBS (irritable bowel syndrome) 01/28/2012    Past Surgical History:  Procedure Laterality Date  . CORONARY ARTERY BYPASS GRAFT N/A 03/24/2017   Procedure: CORONARY ARTERY BYPASS GRAFTING (CABG) x two, using left internal mammary artery, left radial artery, and right leg greater saphenous vein harvested endoscopically;  Surgeon: Ivin Poot, MD;  Location: [facility];  Service: Open Heart Surgery;  Laterality:  N/A;  . ENDOVEIN HARVEST OF GREATER SAPHENOUS VEIN Right 03/24/2017   Procedure: ENDOVEIN HARVEST OF GREATER SAPHENOUS VEIN;  Surgeon: Ivin Poot, MD;  Location: [facility];  Service: Open Heart Surgery;  Laterality: Right;  . INCONTINENCE SURGERY    . LEEP N/A 03/28/2014   Procedure: LOOP ELECTROSURGICAL EXCISION PROCEDURE (LEEP) cone biopsy;  Surgeon: Cyril Mourning, MD;  Location: [facility];  Service: Gynecology;  Laterality: N/A;  . LEFT HEART CATH AND CORONARY ANGIOGRAPHY N/A 03/23/2017   Procedure:  LEFT HEART CATH AND CORONARY ANGIOGRAPHY;  Surgeon: Jettie Booze, MD;  Location: [facility];  Service: Cardiovascular;  Laterality: N/A;  . LIVER BIOPSY     x 2  . RADIAL ARTERY HARVEST Left 03/24/2017   Procedure: RADIAL ARTERY HARVEST;  Surgeon: Ivin Poot, MD;  Location: [facility];  Service: Open Heart Surgery;  Laterality: Left;  . TEE WITHOUT CARDIOVERSION N/A 03/24/2017   Procedure: TRANSESOPHAGEAL ECHOCARDIOGRAM (TEE);  Surgeon: Prescott Gum, Collier Salina, MD;  Location: [facility];  Service: Open Heart Surgery;  Laterality: N/A;  . WISDOM TOOTH EXTRACTION       OB History    Gravida  3   Para  3   Term      Preterm      AB      Living        SAB      TAB      Ectopic      Multiple      Live Births           Obstetric Comments  None         Home Medications    Prior to Admission medications   Medication Sig Start Date End Date Taking? Authorizing Provider  Alirocumab (PRALUENT) 150 MG/ML SOAJ Inject 1 Dose into the skin every 14 (fourteen) days. 01/08/18  Yes Pixie Casino, MD  aspirin EC 81 MG tablet Take 1 tablet (81 mg total) by mouth daily. 05/04/17  Yes Barrett, Erin R, PA-C  cycloSPORINE (RESTASIS) 0.05 % ophthalmic emulsion Place 1 drop into both eyes daily.   Yes [provider]  furosemide (LASIX) 20 MG tablet Take 1 tablet (20 mg total) by mouth daily. 11/06/17  Yes Isaiah Serge, NP  hydrOXYzine (ATARAX/VISTARIL) 25 MG tablet Take 25 mg by mouth every 6 (six) hours as needed for itching.    Yes [provider]  ibuprofen (ADVIL,MOTRIN) 600 MG tablet Take 1 tablet (600 mg total) by mouth every 6 (six) hours as needed for headache, mild pain or moderate pain. 11/06/17  Yes Isaiah Serge, NP  metoprolol tartrate (LOPRESSOR) 25 MG tablet Take 0.5 tablets (12.5 mg total) by mouth 2 (two) times daily. 08/17/17  Yes Revankar, Reita Cliche, MD  metroNIDAZOLE (METROCREAM) 0.75 % cream Apply 1 application topically 2 (two) times daily.    Yes [provider]  Multiple Vitamins-Minerals (DEKAS PLUS PO) Take 1 capsule by mouth daily.   Yes [provider]  naltrexone (DEPADE) 50 MG tablet Take 50 mg by mouth daily as needed. 12/28/17  Yes [provider]  omeprazole (PRILOSEC) 10 MG capsule Take 10 mg by mouth daily as needed (acid reflux).    Yes [provider]  Probiotic Product (PROBIOTIC PO) Take 1 capsule by mouth daily.    Yes [provider]  spironolactone (ALDACTONE) 50 MG tablet Take 50 mg by mouth daily.  08/24/17  Yes [provider]  temazepam (RESTORIL)  30 MG capsule Take 30 mg by mouth at bedtime.   Yes [provider]  triamcinolone cream (KENALOG) 0.1 % Apply 1 application topically 2 (two) times daily.   Yes [provider]  ursodiol (ACTIGALL) 300 MG capsule Take 600-900 mg by mouth See admin instructions. Take 900 mg in the morning and 600 mg in the evening   Yes [provider]  nitroGLYCERIN (NITROSTAT) 0.4 MG SL tablet Place 1 tablet (0.4 mg total) under the tongue every 5 (five) minutes as needed for chest pain. 05/12/17 11/05/17  Ward, Ozella Almond, PA-C    Family History Family History  Problem Relation Age of Onset  . CAD Father   . Diabetes Mellitus II Father   . Heart disease Father   . Heart attack Brother   . Heart disease Maternal Aunt   . Heart attack Paternal Grandmother   . Heart attack Paternal Grandfather     Social History Social History   Tobacco Use  . Smoking status: Never Smoker  . Smokeless tobacco: Never Used  Substance Use Topics  . Alcohol use: Yes    Alcohol/week: 1.0 standard drinks    Types: 1 Glasses of wine per week    Comment: for dinner  . Drug use: No     Allergies   Codeine; Erythromycin; and Fentanyl   Review of Systems Review of Systems  Constitutional: Negative for chills and fever.  HENT: Negative for ear pain and sore throat.   Eyes: Negative for pain and visual  disturbance.  Respiratory: Negative for cough and shortness of breath.   Cardiovascular: Negative for chest pain.  Gastrointestinal: Positive for abdominal pain (epigastric), nausea and vomiting. Negative for constipation and diarrhea.       Hematemesis  Genitourinary: Negative for dysuria and hematuria.  Musculoskeletal: Negative for back pain.  Skin: Negative for rash.  Neurological: Positive for light-headedness.  All other systems reviewed and are negative.   Physical Exam Updated Vital Signs BP (!) 109/55   Pulse (!) 105   Temp 98.6 F (37 C) (Oral)   Resp 15   Ht _0  (1.549 m)   Wt 63 kg   SpO2 100%   BMI 26.26 kg/m   Physical Exam  Constitutional: She appears well-developed and well-nourished. No distress.  HENT:  Head: Normocephalic and atraumatic.  Mouth/Throat: Oropharynx is clear and moist.  Eyes: Conjunctivae are normal.  Neck: Neck supple.  Cardiovascular: Normal rate, regular rhythm and normal heart sounds.  No murmur heard. Pulmonary/Chest: Effort normal and breath sounds normal. No respiratory distress. She has no wheezes.  Abdominal: Soft. Bowel sounds are normal. She exhibits no distension. There is tenderness (mild epigastric). There is no guarding.  No rigidity or rebound ttp  Neurological: She is alert.  Skin: Skin is warm and dry.  Psychiatric: She has a normal mood and affect.  Nursing note and vitals reviewed.   ED Treatments / Results  Labs (all labs ordered are listed, but only abnormal results are displayed) Labs Reviewed  COMPREHENSIVE METABOLIC PANEL - Abnormal; Notable for the following components:      Result Value   Glucose, Bld 100 (*)    Calcium 8.1 (*)    Total Protein 5.6 (*)    Albumin 2.1 (*)    AST 75 (*)    Alkaline Phosphatase 213 (*)    Total Bilirubin 6.8 (*)    All other components within normal limits  CBC - Abnormal; Notable for the following components:  RBC 3.15 (*)    Hemoglobin 9.6 (*)    HCT 29.6 (*)     RDW 17.0 (*)    All other components within normal limits  URINALYSIS, ROUTINE W REFLEX MICROSCOPIC - Abnormal; Notable for the following components:   Color, Urine AMBER (*)    Bilirubin Urine MODERATE (*)    All other components within normal limits  LIPASE, BLOOD  PROTIME-INR  I-STAT BETA HCG BLOOD, ED ([facility], [facility], [facility] ONLY)  TYPE AND SCREEN  ABO/RH    EKG None  Radiology No results found.  Procedures Procedures (including critical care time)  Medications Ordered in ED Medications  pantoprazole (PROTONIX) 80 mg in sodium chloride 0.9 % 250 mL (0.32 mg/mL) infusion (8 mg/hr Intravenous New Bag/Given 02/01/18 0738)  pantoprazole (PROTONIX) 80 mg in sodium chloride 0.9 % 100 mL IVPB (0 mg Intravenous Stopped 02/01/18 0806)  ondansetron (ZOFRAN) injection 4 mg (4 mg Intravenous Given 02/01/18 0644)  sodium chloride 0.9 % bolus 500 mL (0 mLs Intravenous Stopped 02/01/18 0718)     Initial Impression / Assessment and Plan / ED Course  I have reviewed the triage vital signs and the nursing notes.  Pertinent labs & imaging results that were available during my care of the patient were reviewed by me and considered in my medical decision making (see chart for details).     Final Clinical Impressions(s) / ED Diagnoses   Final diagnoses:  Hematemesis with nausea   47 year old female presenting with episode of gross hematemesis this morning at 3 AM.  Has had no further episodes.  Denies any history of this.  On arrival she is borderline hypotensive and tachycardic.  Otherwise vital signs are normal.  Blood pressure and heart rate improved after fluid administration.  She remained stable at this time.  She is mild epigastric abdominal tenderness but no peritoneal signs.  CBC shows anemia with hemoglobin of 9.6, slightly decreased from 2 months ago when it was 10.3.  No leukocytosis.  Platelets are within normal limits.  CMP with normal BUN and creatinine.  Electrolytes are grossly normal.   AST, alk phos, and total bilirubin elevated which is baseline for her.  Lipase is negative.  PT/INR is within normal limits.  Beta hCG is negative.  Type and screen was obtained.  UA is still pending at this time.  8:01 AM CONSULT with Dr. Watt Climes with gastroenterology who will plan for EGD this morning.  We will plan for admission in the setting of gross hematemesis and anemia.   CONSULT With Dr. Cathlean Sauer who accepts pt for admission.   ED Discharge Orders    None       Bishop Dublin 02/01/18 0940    Ripley Fraise, MD 02/02/18 Benancio Deeds

## 2018-02-01 NOTE — Transfer of Care (Signed)
Immediate Anesthesia Transfer of Care Note  Patient: Christine Butler  Procedure(s) Performed: ESOPHAGOGASTRODUODENOSCOPY (EGD) WITH PROPOFOL (N/A )  Patient Location: Endoscopy Unit  Anesthesia Type:MAC  Level of Consciousness: awake, alert , oriented and patient cooperative  Airway & Oxygen Therapy: Patient Spontanous Breathing and Patient connected to nasal cannula oxygen  Post-op Assessment: Report given to RN, Post -op Vital signs reviewed and stable and Patient moving all extremities  Post vital signs: Reviewed and stable  Last Vitals:  Vitals Value Taken Time  BP 95/40 02/01/2018  2:50 PM  Temp    Pulse 103 02/01/2018  2:51 PM  Resp 18 02/01/2018  2:51 PM  SpO2 100 % 02/01/2018  2:51 PM  Vitals shown include unvalidated device data.  Last Pain:  Vitals:   02/01/18 1327  TempSrc: Oral  PainSc: 1          Complications: No apparent anesthesia complications

## 2018-02-01 NOTE — Consult Note (Signed)
[facility] Consult  Referring Provider: Dr.Arrien/[facility] Primary Care Physician:  Scifres, Earlie Server, PA-C Primary Gastroenterologist: Dr.Eeshan Verbrugge  Reason for Consultation: Vomiting blood  HPI: Christine Butler is a 47 y.o. 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 Patient woke up at 3 AM with severe nausea and had an episode of vomiting blood, which was bright red and large in amount.  Patient denies any recent NSAID use(last use 1 pill of ibuprofen 2 weeks ago for headache) and is on metoprolol for coronary artery disease, recently had CABG within a year. Patient had an EGD in December 2018 with no evidence of varices noted. Patient has been feeling dizzy over the past week, denies black stools, intermittently sees minimal amount of bright red blood, and was scheduled for her first colonoscopy on 02/09/2018. She complains of tightness in the epigastric area, denies difficulty or pain on swallowing. She was on low dosage of Lasix 20 mg and spironolactone 50 mg a day, for ascites and bipedal edema, which was stopped after her recent office visit at [facility] with Dr. Ina Homes.  She gained 8 pounds thereafter and restarted with diuretics, currently has resolution of pedal edema and weight gain. She measured her blood pressure at home which was 86/ 44 mmHg. She denies alcohol use/abuse.   Past Medical History:  Diagnosis Date  . Arthritis    right knee  . Ascites--mild this admit 11/06/2017  . CAD (coronary artery disease)    a. s/p CABGx2 in 02/2017 (LAD not suitable for PCI), EF normal.  . Familial hyperlipidemia   . GERD (gastroesophageal reflux disease)   . IBS (irritable bowel syndrome)   . PONV (postoperative nausea and vomiting)   . Primary biliary cirrhosis ([place])   .  S/P CABG (coronary artery bypass graft)   . SVD (spontaneous vaginal delivery)    x 3  . Urinary tract bacterial infections     Past Surgical History:  Procedure Laterality Date  . CORONARY ARTERY BYPASS GRAFT N/A 03/24/2017   Procedure: CORONARY ARTERY BYPASS GRAFTING (CABG) x two, using left internal mammary artery, left radial artery, and right leg greater saphenous vein harvested endoscopically;  Surgeon: Ivin Poot, MD;  Location: [place];  Service: Open Heart Surgery;  Laterality: N/A;  . ENDOVEIN HARVEST OF GREATER SAPHENOUS VEIN Right 03/24/2017   Procedure: ENDOVEIN HARVEST OF GREATER SAPHENOUS VEIN;  Surgeon: Ivin Poot, MD;  Location: [place];  Service: Open Heart Surgery;  Laterality: Right;  . INCONTINENCE SURGERY    . LEEP N/A 03/28/2014   Procedure: LOOP ELECTROSURGICAL EXCISION PROCEDURE (LEEP) cone biopsy;  Surgeon: Cyril Mourning, MD;  Location: [facility];  Service: Gynecology;  Laterality: N/A;  . LEFT HEART CATH AND CORONARY ANGIOGRAPHY N/A 03/23/2017   Procedure: LEFT HEART CATH AND CORONARY ANGIOGRAPHY;  Surgeon: Jettie Booze, MD;  Location: [place] [facility];  Service: Cardiovascular;  Laterality: N/A;  . LIVER BIOPSY     x 2  . RADIAL ARTERY HARVEST Left 03/24/2017   Procedure: RADIAL ARTERY HARVEST;  Surgeon: Ivin Poot, MD;  Location: [place];  Service: Open Heart Surgery;  Laterality: Left;  . TEE WITHOUT CARDIOVERSION N/A 03/24/2017   Procedure: TRANSESOPHAGEAL ECHOCARDIOGRAM (TEE);  Surgeon: Prescott Gum, Collier Salina, MD;  Location: [place];  Service: Open Heart Surgery;  Laterality: N/A;  . WISDOM TOOTH EXTRACTION      Prior to Admission medications   Medication Sig Start Date End Date Taking? Authorizing Provider  Alirocumab (PRALUENT) 150 MG/ML SOAJ Inject 1 Dose into the skin every 14 (fourteen) days. 01/08/18  Yes Pixie Casino, MD  aspirin EC 81 MG tablet Take 1 tablet (81 mg total) by mouth daily. 05/04/17  Yes Barrett, Erin R, PA-C  cycloSPORINE  (RESTASIS) 0.05 % ophthalmic emulsion Place 1 drop into both eyes daily.   Yes [provider]  furosemide (LASIX) 20 MG tablet Take 1 tablet (20 mg total) by mouth daily. 11/06/17  Yes Isaiah Serge, NP  hydrOXYzine (ATARAX/VISTARIL) 25 MG tablet Take 25 mg by mouth every 6 (six) hours as needed for itching.    Yes [provider]  ibuprofen (ADVIL,MOTRIN) 600 MG tablet Take 1 tablet (600 mg total) by mouth every 6 (six) hours as needed for headache, mild pain or moderate pain. 11/06/17  Yes Isaiah Serge, NP  metoprolol tartrate (LOPRESSOR) 25 MG tablet Take 0.5 tablets (12.5 mg total) by mouth 2 (two) times daily. 08/17/17  Yes Revankar, Reita Cliche, MD  metroNIDAZOLE (METROCREAM) 0.75 % cream Apply 1 application topically 2 (two) times daily.   Yes [provider]  Multiple Vitamins-Minerals (DEKAS PLUS PO) Take 1 capsule by mouth daily.   Yes [provider]  naltrexone (DEPADE) 50 MG tablet Take 50 mg by mouth daily as needed. 12/28/17  Yes [provider]  omeprazole (PRILOSEC) 10 MG capsule Take 10 mg by mouth daily as needed (acid reflux).    Yes [provider]  Probiotic Product (PROBIOTIC PO) Take 1 capsule by mouth daily.    Yes [provider]  spironolactone (ALDACTONE) 50 MG tablet Take 50 mg by mouth daily.  08/24/17  Yes [provider]  temazepam (RESTORIL) 30 MG capsule Take 30 mg by mouth at bedtime.   Yes [provider]  triamcinolone cream (KENALOG) 0.1 % Apply 1 application topically 2 (two) times daily.   Yes [provider]  ursodiol (ACTIGALL) 300 MG capsule Take 600-900 mg by mouth See admin instructions. Take 900 mg in the morning and 600 mg in the evening   Yes [provider]  nitroGLYCERIN (NITROSTAT) 0.4 MG SL tablet Place 1 tablet (0.4 mg total) under the tongue every 5 (five) minutes as needed for chest pain. 05/12/17 11/05/17  Ward, Ozella Almond, PA-C    Current  Facility-Administered Medications  Medication Dose Route Frequency Provider Last Rate Last Dose  . pantoprazole (PROTONIX) 80 mg in sodium chloride 0.9 % 250 mL (0.32 mg/mL) infusion  8 mg/hr Intravenous Continuous Couture, Cortni S, PA-C 25 mL/hr at 02/01/18 0738 8 mg/hr at 02/01/18 5093   Current Outpatient Medications  Medication Sig Dispense Refill  . Alirocumab (PRALUENT) 150 MG/ML SOAJ Inject 1 Dose into the skin every 14 (fourteen) days. 2 pen 11  . aspirin EC 81 MG tablet Take 1 tablet (81 mg total) by mouth daily.    . cycloSPORINE (RESTASIS) 0.05 % ophthalmic emulsion Place 1 drop into both eyes daily.    . furosemide (LASIX) 20 MG tablet Take 1 tablet (20 mg total) by mouth daily. 30 tablet 6  . hydrOXYzine (ATARAX/VISTARIL) 25 MG tablet Take 25 mg by mouth every 6 (six) hours as needed for itching.     Marland Kitchen ibuprofen (ADVIL,MOTRIN) 600 MG tablet Take 1 tablet (600 mg total) by mouth every 6 (six) hours  as needed for headache, mild pain or moderate pain. 30 tablet 0  . metoprolol tartrate (LOPRESSOR) 25 MG tablet Take 0.5 tablets (12.5 mg total) by mouth 2 (two) times daily. 90 tablet 1  . metroNIDAZOLE (METROCREAM) 0.75 % cream Apply 1 application topically 2 (two) times daily.    . Multiple Vitamins-Minerals (DEKAS PLUS PO) Take 1 capsule by mouth daily.    . naltrexone (DEPADE) 50 MG tablet Take 50 mg by mouth daily as needed.  11  . omeprazole (PRILOSEC) 10 MG capsule Take 10 mg by mouth daily as needed (acid reflux).     . Probiotic Product (PROBIOTIC PO) Take 1 capsule by mouth daily.     Marland Kitchen spironolactone (ALDACTONE) 50 MG tablet Take 50 mg by mouth daily.   1  . temazepam (RESTORIL) 30 MG capsule Take 30 mg by mouth at bedtime.    . triamcinolone cream (KENALOG) 0.1 % Apply 1 application topically 2 (two) times daily.    . ursodiol (ACTIGALL) 300 MG capsule Take 600-900 mg by mouth See admin instructions. Take 900 mg in the morning and 600 mg in the evening    . nitroGLYCERIN  (NITROSTAT) 0.4 MG SL tablet Place 1 tablet (0.4 mg total) under the tongue every 5 (five) minutes as needed for chest pain. 25 tablet 6    Allergies as of 02/01/2018 - Review Complete 02/01/2018  Allergen Reaction Noted  . Codeine Nausea And Vomiting 08/27/2011  . Erythromycin Nausea And Vomiting 08/27/2011  . Fentanyl Nausea And Vomiting 03/19/2017    Family History  Problem Relation Age of Onset  . CAD Father   . Diabetes Mellitus II Father   . Heart disease Father   . Heart attack Brother   . Heart disease Maternal Aunt   . Heart attack Paternal Grandmother   . Heart attack Paternal Grandfather     Social History   Socioeconomic History  . Marital status: Single    Spouse name: Not on file  . Number of children: 3  . Years of education: 1  . Highest education level: Associate degree: academic program  Occupational History  . Not on file  Social Needs  . Financial resource strain: Not hard at all  . Food insecurity:    Worry: Never true    Inability: Never true  . Transportation needs:    Medical: No    Non-medical: No  Tobacco Use  . Smoking status: Never Smoker  . Smokeless tobacco: Never Used  Substance and Sexual Activity  . Alcohol use: Yes    Alcohol/week: 1.0 standard drinks    Types: 1 Glasses of wine per week    Comment: for dinner  . Drug use: No  . Sexual activity: Yes    Partners: Male    Birth control/protection: None  Lifestyle  . Physical activity:    Days per week: 0 days    Minutes per session: 0 min  . Stress: Not at all  Relationships  . Social connections:    Talks on phone: More than three times a week    Gets together: More than three times a week    Attends religious service: More than 4 times per year    Active member of club or organization: Yes    Attends meetings of clubs or organizations: More than 4 times per year    Relationship status: Divorced  . Intimate partner violence:    Fear of current or ex partner: No  Emotionally abused: No    Physically abused: No    Forced sexual activity: No  Other Topics Concern  . Not on file  Social History Narrative  . Not on file    Review of Systems: Positive for: GI: Described in detail in HPI.    Gen:  fatigue, weakness, malaise,Denies any fever, chills, rigors, night sweats, anorexia, involuntary weight loss, and sleep disorder CV: Denies chest pain, angina, palpitations, syncope, orthopnea, PND, peripheral edema, and claudication. Resp: Denies dyspnea, cough, sputum, wheezing, coughing up blood. GU : Denies urinary burning, blood in urine, urinary frequency, urinary hesitancy, nocturnal urination, and urinary incontinence. MS: Denies joint pain or swelling.  Denies muscle weakness, cramps, atrophy.  Derm:  Rash, itching,Denies oral ulcerations, hives, unhealing ulcers.  Psych: Denies depression, anxiety, memory loss, suicidal ideation, hallucinations,  and confusion. Heme: Denies bruising and enlarged lymph nodes. Neuro:  headaches, dizziness,Denies any paresthesias. Endo:  Denies any problems with DM, thyroid, adrenal function.  Physical Exam: Vital signs in last 24 hours: Temp:  [98.6 F (37 C)] 98.6 F (37 C) (12/09 0440) Pulse Rate:  [93-105] 105 (12/09 0936) Resp:  [15-16] 15 (12/09 0936) BP: (94-116)/(55-101) 109/55 (12/09 0936) SpO2:  [99 %-100 %] 100 % (12/09 0936) Weight:  [63 kg] 63 kg (12/09 0440)    General:   Alert,  Well-developed, well-nourished, pleasant and cooperative in NAD Head:  Normocephalic and atraumatic. Eyes:  Sclera clear, icterus.   Mild pallor Ears:  Normal auditory acuity. Nose:  No deformity, discharge,  or lesions. Mouth:  No deformity or lesions.  Oropharynx pink & moist. Neck:  Supple; no masses or thyromegaly. Lungs:  Clear throughout to auscultation.   No wheezes, crackles, or rhonchi. No acute distress. Heart:  Regular rate and rhythm; no murmurs, clicks, rubs,  or gallops. Extremities:  Without clubbing  or edema. Neurologic:  Alert and  oriented x4;  grossly normal neurologically. Skin:  Intact without significant lesions or rashes. Psych:  Alert and cooperative. Normal mood and affect. Abdomen:  Soft, nontender and nondistended. No masses, hepatosplenomegaly or hernias noted. Normal bowel sounds, without guarding, and without rebound.         Lab Results: Recent Labs    02/01/18 0631  WBC 5.2  HGB 9.6*  HCT 29.6*  PLT 153   BMET Recent Labs    02/01/18 0631  NA 135  K 3.9  CL 103  CO2 24  GLUCOSE 100*  BUN 18  CREATININE 0.48  CALCIUM 8.1*   LFT Recent Labs    02/01/18 0631  PROT 5.6*  ALBUMIN 2.1*  AST 75*  ALT 37  ALKPHOS 213*  BILITOT 6.8*   PT/INR Recent Labs    02/01/18 0631  LABPROT 15.0  INR 1.19    Studies/Results: No results found.  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 Start patient on IV Sandostatin and continue as a drip. Plan EGD today, possible variceal bleeding, less likely peptic ulcer disease. The risk and the benefits of the procedure were discussed with the patient in details.  She understands and verbalizes consent.   LOS: 0 days   [name], MD  02/01/2018, 9:42 AM  Pager [phone] If no answer or after 5 PM call [phone]

## 2018-02-01 NOTE — Plan of Care (Signed)
  Problem: Education: Goal: Knowledge of General Education information will improve Description: Including pain rating scale, medication(s)/side effects and non-pharmacologic comfort measures Outcome: Progressing   Problem: Nutrition: Goal: Adequate nutrition will be maintained Outcome: Progressing   Problem: Elimination: Goal: Will not experience complications related to bowel motility Outcome: Progressing Goal: Will not experience complications related to urinary retention Outcome: Progressing   Problem: Pain Managment: Goal: General experience of comfort will improve Outcome: Progressing   Problem: Safety: Goal: Ability to remain free from injury will improve Outcome: Progressing   Problem: Skin Integrity: Goal: Risk for impaired skin integrity will decrease Outcome: Progressing   

## 2018-02-01 NOTE — Anesthesia Preprocedure Evaluation (Addendum)
Anesthesia Evaluation  Patient identified by MRN, date of birth, ID band Patient awake    Reviewed: Allergy & Precautions, NPO status , Patient's Chart, lab work & pertinent test results, reviewed documented beta blocker date and time   History of Anesthesia Complications (+) PONV and history of anesthetic complications  Airway Mallampati: II  TM Distance: >3 FB Neck ROM: Full    Dental  (+) Teeth Intact, Dental Advisory Given   Pulmonary neg pulmonary ROS,    Pulmonary exam normal breath sounds clear to auscultation       Cardiovascular + CAD and + CABG  Normal cardiovascular exam Rhythm:Regular Rate:Normal  Echo 09/30/2017: Study Conclusions  - Left ventricle: The cavity size was normal. Systolic function was normal. The estimated ejection fraction was in the range of 55% to 60%. Wall motion was normal; there were no regional wall motion abnormalities. Left ventricular diastolic function parameters were normal. - Aortic valve: Valve area (Vmax): 2.35 cm^2. - Left atrium: The atrium was moderately dilated. - Right ventricle: The cavity size was mildly dilated. Wall   thickness was normal. - Right atrium: The atrium was mildly dilated.   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  Reproductive/Obstetrics                             Anesthesia Physical Anesthesia Plan  ASA: III and emergent  Anesthesia Plan: MAC   Post-op Pain Management:    Induction: Intravenous  PONV Risk Score and Plan: 3 and Propofol  infusion and Treatment may vary due to age or medical condition  Airway Management Planned: Nasal Cannula and Natural Airway  Additional Equipment:   Intra-op Plan:   Post-operative Plan:   Informed Consent: I have reviewed the patients History and Physical, chart, labs and discussed the procedure including the risks, benefits and alternatives for the proposed anesthesia with the patient or authorized representative who has indicated his/her understanding and acceptance.   Dental advisory given  Plan Discussed with: CRNA and Anesthesiologist  Anesthesia Plan Comments:         Anesthesia Quick Evaluation

## 2018-02-01 NOTE — Progress Notes (Signed)
IV team to floor to attempt to get IV for blood administration. Informed this RN that they were unable to get IV with ultrasound. NP on call, Schorr, notified. Will hold off on blood administration until tomorrow. Pt continues to c/o N/V. PRNs given as ordered. Will continue to monitor.

## 2018-02-01 NOTE — H&P (Signed)
History and Physical    Christine Butler:[id number] DOB: [birth year] DOA: 02/01/2018  PCP: Maude Leriche, PA-C   Patient coming from: Home   Chief Complaint: hematemesis.   HPI: Christine Butler is a 47 y.o. female with medical history significant of primary biliary cirrhosis, dyslipidemia, GERD and coronary artery disease status post bypass grafting who presents with acute episode of hematemesis.  Patient had been at her usual state of health until early this morning when he felt nauseous and had hematemesis, moderate to large amounts with clots, initially bright red blood then coffee-ground/dark.  Her bleeding was associated with lightheadedness, dizziness and orthostatic hypotension (systolic blood pressure 80 mmHg).  No improving or worsening factors.  Patient takes aspirin for secondary prophylaxis for cardiovascular disease, denies any NSAID use.  Her cirrhosis has been well controlled, had occasional ascites controlled with diuretics, last endoscopy with no esophageal varices.   ED Course: Patient was found pale, icterus, her hemoglobin had dropped down to 9.6 from 10.3.  She was referred for admission for evaluation.  Review of Systems:  1. General: No fevers, no chills, no weight gain or weight loss 2. ENT: No runny nose or sore throat, no hearing disturbances 3. Pulmonary: No dyspnea, cough, wheezing, or hemoptysis 4. Cardiovascular: No angina, claudication, lower extremity edema, pnd or orthopnea 5. Gastrointestinal: No nausea or vomiting, no diarrhea or constipation/ positive hematemesis as mentioned in HPI.  6. Hematology: No easy bruisability or frequent infections 7. Urology: No dysuria, hematuria or increased urinary frequency 8. Dermatology: No rashes. 9. Neurology: No seizures or paresthesias 10. Musculoskeletal: No joint pain or deformities  Past Medical History:  Diagnosis Date  . Arthritis    right knee  . Ascites--mild this admit 11/06/2017  . CAD (coronary  artery disease)    a. s/p CABGx2 in 02/2017 (LAD not suitable for PCI), EF normal.  . Familial hyperlipidemia   . GERD (gastroesophageal reflux disease)   . IBS (irritable bowel syndrome)   . PONV (postoperative nausea and vomiting)   . Primary biliary cirrhosis ([place])   . S/P CABG (coronary artery bypass graft)   . SVD (spontaneous vaginal delivery)    x 3  . Urinary tract bacterial infections     Past Surgical History:  Procedure Laterality Date  . CORONARY ARTERY BYPASS GRAFT N/A 03/24/2017   Procedure: CORONARY ARTERY BYPASS GRAFTING (CABG) x two, using left internal mammary artery, left radial artery, and right leg greater saphenous vein harvested endoscopically;  Surgeon: Ivin Poot, MD;  Location: [facility];  Service: Open Heart Surgery;  Laterality: N/A;  . ENDOVEIN HARVEST OF GREATER SAPHENOUS VEIN Right 03/24/2017   Procedure: ENDOVEIN HARVEST OF GREATER SAPHENOUS VEIN;  Surgeon: Ivin Poot, MD;  Location: [facility];  Service: Open Heart Surgery;  Laterality: Right;  . INCONTINENCE SURGERY    . LEEP N/A 03/28/2014   Procedure: LOOP ELECTROSURGICAL EXCISION PROCEDURE (LEEP) cone biopsy;  Surgeon: Cyril Mourning, MD;  Location: [facility];  Service: Gynecology;  Laterality: N/A;  . LEFT HEART CATH AND CORONARY ANGIOGRAPHY N/A 03/23/2017   Procedure: LEFT HEART CATH AND CORONARY ANGIOGRAPHY;  Surgeon: Jettie Booze, MD;  Location: [facility];  Service: Cardiovascular;  Laterality: N/A;  . LIVER BIOPSY     x 2  . RADIAL ARTERY HARVEST Left 03/24/2017   Procedure: RADIAL ARTERY HARVEST;  Surgeon: Ivin Poot, MD;  Location: [facility];  Service: Open Heart Surgery;  Laterality: Left;  . TEE WITHOUT  CARDIOVERSION N/A 03/24/2017   Procedure: TRANSESOPHAGEAL ECHOCARDIOGRAM (TEE);  Surgeon: Prescott Gum, Collier Salina, MD;  Location: [facility];  Service: Open Heart Surgery;  Laterality: N/A;  . WISDOM TOOTH EXTRACTION       reports that she has never smoked. She has never used smokeless  tobacco. She reports that she drinks about 1.0 standard drinks of alcohol per week. She reports that she does not use drugs.  Allergies  Allergen Reactions  . Codeine Nausea And Vomiting  . Erythromycin Nausea And Vomiting  . Fentanyl Nausea And Vomiting    Family History  Problem Relation Age of Onset  . CAD Father   . Diabetes Mellitus II Father   . Heart disease Father   . Heart attack Brother   . Heart disease Maternal Aunt   . Heart attack Paternal Grandmother   . Heart attack Paternal Grandfather      Prior to Admission medications   Medication Sig Start Date End Date Taking? Authorizing Provider  Alirocumab (PRALUENT) 150 MG/ML SOAJ Inject 1 Dose into the skin every 14 (fourteen) days. 01/08/18  Yes Pixie Casino, MD  aspirin EC 81 MG tablet Take 1 tablet (81 mg total) by mouth daily. 05/04/17  Yes Barrett, Erin R, PA-C  cycloSPORINE (RESTASIS) 0.05 % ophthalmic emulsion Place 1 drop into both eyes daily.   Yes [provider]  furosemide (LASIX) 20 MG tablet Take 1 tablet (20 mg total) by mouth daily. 11/06/17  Yes Isaiah Serge, NP  hydrOXYzine (ATARAX/VISTARIL) 25 MG tablet Take 25 mg by mouth every 6 (six) hours as needed for itching.    Yes [provider]  ibuprofen (ADVIL,MOTRIN) 600 MG tablet Take 1 tablet (600 mg total) by mouth every 6 (six) hours as needed for headache, mild pain or moderate pain. 11/06/17  Yes Isaiah Serge, NP  metoprolol tartrate (LOPRESSOR) 25 MG tablet Take 0.5 tablets (12.5 mg total) by mouth 2 (two) times daily. 08/17/17  Yes Revankar, Reita Cliche, MD  metroNIDAZOLE (METROCREAM) 0.75 % cream Apply 1 application topically 2 (two) times daily.   Yes [provider]  Multiple Vitamins-Minerals (DEKAS PLUS PO) Take 1 capsule by mouth daily.   Yes [provider]  naltrexone (DEPADE) 50 MG tablet Take 50 mg by mouth daily as needed. 12/28/17  Yes [provider]  omeprazole (PRILOSEC) 10 MG capsule Take  10 mg by mouth daily as needed (acid reflux).    Yes [provider]  Probiotic Product (PROBIOTIC PO) Take 1 capsule by mouth daily.    Yes [provider]  spironolactone (ALDACTONE) 50 MG tablet Take 50 mg by mouth daily.  08/24/17  Yes [provider]  temazepam (RESTORIL) 30 MG capsule Take 30 mg by mouth at bedtime.   Yes [provider]  triamcinolone cream (KENALOG) 0.1 % Apply 1 application topically 2 (two) times daily.   Yes [provider]  ursodiol (ACTIGALL) 300 MG capsule Take 600-900 mg by mouth See admin instructions. Take 900 mg in the morning and 600 mg in the evening   Yes [provider]  nitroGLYCERIN (NITROSTAT) 0.4 MG SL tablet Place 1 tablet (0.4 mg total) under the tongue every 5 (five) minutes as needed for chest pain. 05/12/17 11/05/17  Ward, Ozella Almond, PA-C    Physical Exam: Vitals:   02/01/18 0730 02/01/18 0739 02/01/18 0814 02/01/18 0906  BP: (!) 115/101 (!) 115/101 (!) 115/101 94/61  Pulse: 94 93 93 99  Resp:  16  16 15  Temp:      TempSrc:      SpO2: 100% 100% 100% 100%  Weight:      Height:        Vitals:   02/01/18 0730 02/01/18 0739 02/01/18 0814 02/01/18 0906  BP: (!) 115/101 (!) 115/101 (!) 115/101 94/61  Pulse: 94 93 93 99  Resp:  16 16 15   Temp:      TempSrc:      SpO2: 100% 100% 100% 100%  Weight:      Height:       General: deconditioned and ill looking appearing.  Neurology: Awake and alert, non focal Head and Neck. Head normocephalic. Neck supple with no adenopathy or thyromegaly.   E ENT: positive pallor, positive icterus, oral mucosa moist Cardiovascular: No JVD. S1-S2 present, rhythmic, no gallops, rubs, or murmurs. No lower extremity edema. Pulmonary: positive breath sounds bilaterally, adequate air movement, no wheezing, rhonchi or rales. Gastrointestinal. Abdomen mild distended, no dullness to percussion, no organomegaly, non tender, no rebound or guarding Skin. No  rashes Musculoskeletal: no joint deformities    Labs on Admission: I have personally reviewed following labs and imaging studies  CBC: Recent Labs  Lab 02/01/18 0631  WBC 5.2  HGB 9.6*  HCT 29.6*  MCV 94.0  PLT 614   Basic Metabolic Panel: Recent Labs  Lab 02/01/18 0631  NA 135  K 3.9  CL 103  CO2 24  GLUCOSE 100*  BUN 18  CREATININE 0.48  CALCIUM 8.1*   GFR: Estimated Creatinine Clearance: 74 mL/min (by C-G formula based on SCr of 0.48 mg/dL). Liver Function Tests: Recent Labs  Lab 02/01/18 0631  AST 75*  ALT 37  ALKPHOS 213*  BILITOT 6.8*  PROT 5.6*  ALBUMIN 2.1*   Recent Labs  Lab 02/01/18 0631  LIPASE 38   No results for input(s): AMMONIA in the last 168 hours. Coagulation Profile: Recent Labs  Lab 02/01/18 0631  INR 1.19   Cardiac Enzymes: No results for input(s): CKTOTAL, CKMB, CKMBINDEX, TROPONINI in the last 168 hours. BNP (last 3 results) No results for input(s): PROBNP in the last 8760 hours. HbA1C: No results for input(s): HGBA1C in the last 72 hours. CBG: No results for input(s): GLUCAP in the last 168 hours. Lipid Profile: No results for input(s): CHOL, HDL, LDLCALC, TRIG, CHOLHDL, LDLDIRECT in the last 72 hours. Thyroid Function Tests: No results for input(s): TSH, T4TOTAL, FREET4, T3FREE, THYROIDAB in the last 72 hours. Anemia Panel: No results for input(s): VITAMINB12, FOLATE, FERRITIN, TIBC, IRON, RETICCTPCT in the last 72 hours. Urine analysis:    Component Value Date/Time   COLORURINE AMBER (A) 02/01/2018 0450   APPEARANCEUR CLEAR 02/01/2018 0450   LABSPEC 1.019 02/01/2018 0450   PHURINE 6.0 02/01/2018 0450   GLUCOSEU NEGATIVE 02/01/2018 0450   HGBUR NEGATIVE 02/01/2018 0450   BILIRUBINUR MODERATE (A) 02/01/2018 0450   BILIRUBINUR small 08/04/2013 0836   KETONESUR NEGATIVE 02/01/2018 0450   PROTEINUR NEGATIVE 02/01/2018 0450   UROBILINOGEN 1.0 08/04/2013 0836   UROBILINOGEN 1.0 06/10/2008 1855   NITRITE NEGATIVE  02/01/2018 0450   LEUKOCYTESUR NEGATIVE 02/01/2018 0450    Radiological Exams on Admission: No results found.  EKG: Independently reviewed. NA  Assessment/Plan Active Problems:   Upper GI bleed  47 year old female with primary biliary cirrhosis who presents with acute episodes of moderate to severe hematemesis, associated with static symptoms and hypotension.  On her initial physical examination blood pressure is 109/55, 88/52, heart rate 104, respirate 15,  oxygen saturation 98%.  She is pale, positive icterus, dry mucous membranes, lungs clear to auscultation bilaterally, heart S1-S2 present and rhythmic, abdomen mildly distended, no ascites, nontender, no lower extremity edema.  Sodium 135, potassium 3.9, chloride 103, bicarb 24, glucose 100, BUN 18, creatinine 0.48, lipase 38, white count 5.2, hemoglobin 9.6, hematocrit 39.6, platelets 153.  Urine analysis negative for infection.    Patient will be admitted to the hospital with the working diagnosis of acute blood loss anemia due to upper GI bleed, rule out variceal bleed.  1.  Acute blood loss anemia due to upper GI bleed, rule out variceal bleed.  Patient will be admitted to the telemetry unit, positive hypotension, will transfuse 2 units of packed red blood cells, continue IV pantoprazole drip, keep patient nothing by mouth.  Patient will have endoscopy this morning, case discussed with gastroenterology Dr. Therisa Doyne.  Close H&H monitoring post transfusion.  Continue isotonic saline at 100/h while no transfusing PRBCs.  IV octreotide drip.  Continue IV analgesics and as needed IV antiemetics.  2.  Primary biliary cirrhosis.  Patient has had ascites in the past, positive icterus, suggesting moderate chronic liver failure, certainly she is at risk for esophageal varices.  Continue supportive medical therapy.  Clinically no signs of encephalopathy.  3.  Hypertension.  Stop metoprolol to avoid further hypotension.  4.  Anxiety.  Continue  temazepam per home regimen.  DVT prophylaxis: scd  Code Status: full  Family Communication: I spoke with patient's family at the bedside and all questions were addressed.   Disposition Plan: telemetry   Consults called: GI   Admission status: Inpatient     Candee Hoon Gerome Apley MD [facility] Pager [phone]  If 7PM-7AM, please contact night-coverage [email or web address] Password [email or web address]  02/01/2018, 9:13 AM

## 2018-02-01 NOTE — ED Notes (Addendum)
Called to assess IV compatibility of medications. Awaiting for additional IV access to be obtained at this time to initiate Sandostatin.

## 2018-02-02 LAB — BASIC METABOLIC PANEL
Anion gap: 9 (ref 5–15)
BUN: 21 mg/dL — ABNORMAL HIGH (ref 6–20)
CHLORIDE: 109 mmol/L (ref 98–111)
CO2: 21 mmol/L — AB (ref 22–32)
Calcium: 7.8 mg/dL — ABNORMAL LOW (ref 8.9–10.3)
Creatinine, Ser: 0.58 mg/dL (ref 0.44–1.00)
GFR calc Af Amer: >60 mL/min (ref >60)
GFR calc non Af Amer: >60 mL/min (ref >60)
Glucose, Bld: 115 mg/dL — ABNORMAL HIGH (ref 70–99)
POTASSIUM: 4.1 mmol/L (ref 3.5–5.1)
Sodium: 139 mmol/L (ref 135–145)

## 2018-02-02 LAB — CBC
HCT: 22.3 % — ABNORMAL LOW (ref 36.0–46.0)
Hemoglobin: 7.1 g/dL — ABNORMAL LOW (ref 12.0–15.0)
MCH: 30.1 pg (ref 26.0–34.0)
MCHC: 31.8 g/dL (ref 30.0–36.0)
MCV: 94.5 fL (ref 80.0–100.0)
Platelets: 147 10*3/uL — ABNORMAL LOW (ref 150–400)
RBC: 2.36 MIL/uL — ABNORMAL LOW (ref 3.87–5.11)
RDW: 17.4 % — AB (ref 11.5–15.5)
WBC: 4.8 10*3/uL (ref 4.0–10.5)
nRBC: 0 % (ref 0.0–0.2)

## 2018-02-02 LAB — HEMOGLOBIN AND HEMATOCRIT, BLOOD
HCT: 29.1 % — ABNORMAL LOW (ref 36.0–46.0)
Hemoglobin: 9.5 g/dL — ABNORMAL LOW (ref 12.0–15.0)

## 2018-02-02 LAB — HIV ANTIBODY (ROUTINE TESTING W REFLEX): HIV SCREEN 4TH GENERATION: NONREACTIVE

## 2018-02-02 MED ORDER — SODIUM CHLORIDE 0.9 % IV SOLN
1.0000 g | INTRAVENOUS | Status: DC
  Filled 2018-02-02: qty 10

## 2018-02-02 MED ORDER — SODIUM CHLORIDE 0.9 % IV SOLN
2.0000 g | INTRAVENOUS | Status: DC
  Administered 2018-02-03: 2 g via INTRAVENOUS
  Filled 2018-02-02 (×2): qty 20

## 2018-02-02 NOTE — Progress Notes (Signed)
Patient concerned about receiving Rocephin. Pt stated she would like to hold off until the morning when she can speak to her GI doctor. Patient also concerned about diffuse generalized swelling. Pt stated she feels like there is fluid building up. NP on call notified. New orders placed.

## 2018-02-02 NOTE — Progress Notes (Signed)
Christine Butler 12:14 PM  Subjective: The patient is doing fine today but did have some nausea and vomiting last night and her bowels are formed but black and she is tolerating clear liquids and has no other complaints  Objective: Vital signs stable afebrile no acute distress abdomen is soft nontender hemoglobin slight drop BUN and creatinine okay  Assessment: Variceal bleeding status post banding  Plan: If no signs of further bleeding can have soft solids tomorrow and if okay hopefully can go home soon and will need to wean octreotide tomorrow if doing well and will need to discuss Inderal with primary gastroenterologist as an outpatient as well as the timing of repeat endoscopy and possible colonoscopy which was scheduled in the near future and would use once a day pump inhibitor for now as an outpatient  [facility] E  Pager [phone] After 5PM or if no answer call [phone]

## 2018-02-02 NOTE — Anesthesia Postprocedure Evaluation (Signed)
Anesthesia Post Note  Patient: [name]  Procedure(s) Performed: ESOPHAGOGASTRODUODENOSCOPY (EGD) WITH PROPOFOL (N/A )     Patient location during evaluation: Endoscopy Anesthesia Type: MAC Level of consciousness: awake and alert, oriented and awake Pain management: pain level controlled Vital Signs Assessment: post-procedure vital signs reviewed and stable Respiratory status: spontaneous breathing, nonlabored ventilation and respiratory function stable Cardiovascular status: stable and blood pressure returned to baseline Postop Assessment: no apparent nausea or vomiting Anesthetic complications: no    Last Vitals:  Vitals:   02/01/18 2008 02/02/18 0518  BP: (!) 101/55 (!) 81/47  Pulse: 90 78  Resp: 16 16  Temp: 37.1 C 37 C  SpO2: 96% 98%    Last Pain:  Vitals:   02/02/18 0518  TempSrc: Oral  PainSc:                  Catalina Gravel

## 2018-02-02 NOTE — Progress Notes (Signed)
Patient questioned why she is getting Rocephin and if it's safe for her liver. Notified Dr. Tyrell Antonio and she stated  to inform the patient is due to GI bleed/gastritis prevention of infection and that it's safe for her liver, patient/family notified and also reported off to night shift nurse. Marland Kitchen

## 2018-02-02 NOTE — Progress Notes (Signed)
PROGRESS NOTE    Christine Butler  ZJI:[id number] DOB: 14-Jul-1970 DOA: 02/01/2018 PCP: Maude Leriche, PA-C    Brief Narrative: 47 year old with past medical history significant for primary biliary cirrhosis, dyslipidemia, GERD and coronary artery disease status post bypass grafting who presented with acute episode of hematemesis.  She was vomiting and moderate amount of clots, initially bright red blood subsequently coffee-ground.  She has been lightheaded and her blood pressure was noted to be low.  She has cirrhosis her ascites has been controlled with diuretics.   Assessment & Plan:   Active Problems:   Biliary cirrhosis (H[facility])   Upper GI bleed   GI bleed   1-acute blood loss anemia, due to upper GI bleed.  Secondary to varices bleed. Patient was admitted and started on IV fluids, she is currently receiving 2 units of packed red blood cell. Will need to repeat hemoglobin post transfusion.  If low will need to provide further transfusion. Continue with IV octreotide Continue with IV Protonix. I will restart her IV ceftriaxone to cover for SBP prophylaxis, setting of GI bleed.. She underwent endoscopy on 12-09 with banding of esophageal varices.  2-primary biliary  cirrhosis; Continue with supportive care. Resume home medications when blood pressure stable. GI following. Repeat liver function test in the morning. most recently followed byDr. Ellouise Newer hepatology and liver [facility] at [facility].  Hypotension; she is alert, conversant.  In setting of GI bleed.  Getting blood transfusion.  Repeat Hb.  Hold metoprolol, spironolactone, lasix. Marland Kitchen   Hypertension; Holding metoprolol due to low blood pressure.  Anxiety; continue with temazepam. Coronary artery disease a status post CABG x 2;  Arterial graft 02-2017. Hold aspirin.  RN Pressure Injury Documentation:    Malnutrition Type:      Malnutrition Characteristics:      Nutrition  Interventions:     Estimated body mass index is 26.24 kg/m as calculated from the following:   Height as of this encounter: 5\' 1"  (1.549 m).   Weight as of this encounter: 63 kg.   DVT prophylaxis: scd.  Code Status: full code.  Family Communication: husband at bedside.  Disposition Plan: remain In the hospital for blood transfusion. IV octeotride.  Consultants:   [facility]    Procedures:   Endoscopy, esophageal varices banding.    Antimicrobials:   Ceftriaxone    Subjective: Report mild pain when drinking  fluids. Chest.  She is alert, conversant. Had black stool today.  Vomited last night.    Objective: Vitals:   02/02/18 0830 02/02/18 1029 02/02/18 1058 02/02/18 1330  BP: (!) 90/50 (!) 92/52 (!) 84/49 (!) 87/59  Pulse: 81 78 73 71  Resp: 17 17 17 17   Temp: 98 F (36.7 C) 98.1 F (36.7 C) 97.6 F (36.4 C) 98 F (36.7 C)  TempSrc: Oral Oral Oral Oral  SpO2: 99% 97% 99% 100%  Weight:      Height:        Intake/Output Summary (Last 24 hours) at 02/02/2018 1706 Last data filed at 02/02/2018 1604 Gross per 24 hour  Intake 4020.29 ml  Output -  Net 4020.29 ml   Filed Weights   02/01/18 0440 02/01/18 1327  Weight: 63 kg 63 kg    Examination:  General exam: Appears calm and comfortable , icteric  Respiratory system: crackles bases.  Cardiovascular system: S1 & S2 heard, RRR. No JVD, murmurs, rubs, gallops or clicks.  Gastrointestinal system: Abdomen is nondistended, soft and nontender. No organomegaly  or masses felt. Normal bowel sounds heard. Central nervous system: Alert and oriented. No focal neurological deficits. Extremities: Symmetric 5 x 5 power. Skin: No rashes, lesions or ulcers Psychiatry: Judgement and insight appear normal. Mood & affect appropriate.     Data Reviewed: I have personally reviewed following labs and imaging studies  CBC: Recent Labs  Lab 02/01/18 0631 02/02/18 0549  WBC 5.2 4.8  HGB 9.6* 7.1*  HCT 29.6* 22.3*    MCV 94.0 94.5  PLT 153 355*   Basic Metabolic Panel: Recent Labs  Lab 02/01/18 0631 02/02/18 0549  NA 135 139  K 3.9 4.1  CL 103 109  CO2 24 21*  GLUCOSE 100* 115*  BUN 18 21*  CREATININE 0.48 0.58  CALCIUM 8.1* 7.8*   GFR: Estimated Creatinine Clearance: 74 mL/min (by C-G formula based on SCr of 0.58 mg/dL). Liver Function Tests: Recent Labs  Lab 02/01/18 0631  AST 75*  ALT 37  ALKPHOS 213*  BILITOT 6.8*  PROT 5.6*  ALBUMIN 2.1*   Recent Labs  Lab 02/01/18 0631  LIPASE 38   No results for input(s): AMMONIA in the last 168 hours. Coagulation Profile: Recent Labs  Lab 02/01/18 0631  INR 1.19   Cardiac Enzymes: No results for input(s): CKTOTAL, CKMB, CKMBINDEX, TROPONINI in the last 168 hours. BNP (last 3 results) No results for input(s): PROBNP in the last 8760 hours. HbA1C: No results for input(s): HGBA1C in the last 72 hours. CBG: No results for input(s): GLUCAP in the last 168 hours. Lipid Profile: No results for input(s): CHOL, HDL, LDLCALC, TRIG, CHOLHDL, LDLDIRECT in the last 72 hours. Thyroid Function Tests: No results for input(s): TSH, T4TOTAL, FREET4, T3FREE, THYROIDAB in the last 72 hours. Anemia Panel: No results for input(s): VITAMINB12, FOLATE, FERRITIN, TIBC, IRON, RETICCTPCT in the last 72 hours. Sepsis Labs: No results for input(s): PROCALCITON, LATICACIDVEN in the last 168 hours.  No results found for this or any previous visit (from the past 240 hour(s)).       Radiology Studies: No results found.      Scheduled Meds: . sodium chloride   Intravenous Once  . cycloSPORINE  1 drop Both Eyes Daily  . temazepam  30 mg Oral QHS  . ursodiol  900 mg Oral Daily   And  . ursodiol  600 mg Oral QHS   Continuous Infusions: . sodium chloride 100 mL/hr at 02/02/18 0600  . octreotide  (SANDOSTATIN)    IV infusion 50 mcg/hr (02/02/18 0600)  . pantoprozole (PROTONIX) infusion 8 mg/hr (02/02/18 1529)     LOS: 1 day    Time  spent: 35 minutes.     Elmarie Shiley, MD [facility] Pager [phone]  If 7PM-7AM, please contact night-coverage [email or web address] Password [email or web address] 02/02/2018, 5:06 PM

## 2018-02-02 NOTE — Progress Notes (Signed)
Took over patient care from woody-RN, patient alert/oriented. Denies any pain/distres. No new changes from prior assessment, will continue to F/U with plan of care.

## 2018-02-03 ENCOUNTER — Inpatient Hospital Stay (HOSPITAL_COMMUNITY): Payer: 59

## 2018-02-03 DIAGNOSIS — K745 Biliary cirrhosis, unspecified: Secondary | ICD-10-CM

## 2018-02-03 DIAGNOSIS — K922 Gastrointestinal hemorrhage, unspecified: Secondary | ICD-10-CM

## 2018-02-03 DIAGNOSIS — D62 Acute posthemorrhagic anemia: Secondary | ICD-10-CM

## 2018-02-03 LAB — BASIC METABOLIC PANEL
Anion gap: 8 (ref 5–15)
BUN: 12 mg/dL (ref 6–20)
CHLORIDE: 113 mmol/L — AB (ref 98–111)
CO2: 20 mmol/L — ABNORMAL LOW (ref 22–32)
Calcium: 7.7 mg/dL — ABNORMAL LOW (ref 8.9–10.3)
Creatinine, Ser: 0.48 mg/dL (ref 0.44–1.00)
GFR calc Af Amer: >60 mL/min (ref >60)
GFR calc non Af Amer: >60 mL/min (ref >60)
Glucose, Bld: 94 mg/dL (ref 70–99)
Potassium: 3.7 mmol/L (ref 3.5–5.1)
Sodium: 141 mmol/L (ref 135–145)

## 2018-02-03 LAB — TYPE AND SCREEN
ABO/RH(D): A POS
Antibody Screen: NEGATIVE
UNIT DIVISION: 0
Unit division: 0

## 2018-02-03 LAB — BPAM RBC
Blood Product Expiration Date: 201912302359
Blood Product Expiration Date: 201912302359
ISSUE DATE / TIME: 201912100804
ISSUE DATE / TIME: 201912101043
Unit Type and Rh: 6200
Unit Type and Rh: 6200

## 2018-02-03 LAB — CBC
HCT: 29.4 % — ABNORMAL LOW (ref 36.0–46.0)
Hemoglobin: 9.6 g/dL — ABNORMAL LOW (ref 12.0–15.0)
MCH: 30.2 pg (ref 26.0–34.0)
MCHC: 32.7 g/dL (ref 30.0–36.0)
MCV: 92.5 fL (ref 80.0–100.0)
Platelets: 145 10*3/uL — ABNORMAL LOW (ref 150–400)
RBC: 3.18 MIL/uL — ABNORMAL LOW (ref 3.87–5.11)
RDW: 17.8 % — AB (ref 11.5–15.5)
WBC: 3.9 10*3/uL — AB (ref 4.0–10.5)
nRBC: 0 % (ref 0.0–0.2)

## 2018-02-03 MED ORDER — PANTOPRAZOLE SODIUM 40 MG PO TBEC: 40.0000 mg | DELAYED_RELEASE_TABLET | Freq: Every day | ORAL | 0 refills | Status: DC

## 2018-02-03 MED ORDER — FUROSEMIDE 20 MG PO TABS
20.0000 mg | ORAL_TABLET | Freq: Every day | ORAL | Status: DC
  Administered 2018-02-03: 20 mg via ORAL
  Filled 2018-02-03: qty 1

## 2018-02-03 MED ORDER — SPIRONOLACTONE 25 MG PO TABS
50.0000 mg | ORAL_TABLET | Freq: Every day | ORAL | Status: DC
  Administered 2018-02-03: 50 mg via ORAL
  Filled 2018-02-03: qty 2

## 2018-02-03 MED FILL — PRALUENT 150 MG/ML PEN: 150 | 28 days supply | Qty: 2 | Fill #1

## 2018-02-03 MED FILL — PANTOPRAZOLE SOD DR 40 MG T: 40 | 30 days supply | Qty: 30 | Fill #0

## 2018-02-03 NOTE — Discharge Instructions (Signed)

## 2018-02-03 NOTE — Consult Note (Addendum)
   [facility] CM Inpatient Consult   02/03/2018  Christine Butler 1970/12/29 [id number]   Went to bedside to speak with Mrs. Christine Butler, prior to hospital discharge, on behalf of [employer] program for [facility] employees/dependents with [facility] insurance.  Made Mrs. Bier aware that she will receive a post hospital discharge call from [place]. Provided 24-hr nurse advice line magnet.   Expressed appreciation of visit.    Marthenia Rolling, MSN-Ed, RN,BSN [facility] Liaison [phone]

## 2018-02-03 NOTE — Progress Notes (Signed)
Christine Butler 9:04 AM  Subjective: Patient's main complaint is increased weight gain and fluid retention and she is eating fine and her bowels are getting slightly lighter and she has had occasional esophageal spasm no other specific complaints and we discussed her fluid pills IV fluids octreotide diet and need for antibiotics  Objective: Vital signs stable afebrile no acute distress abdomen is nontender labs okay  Assessment: Seemingly resolved variceal bleeding  Plan: Low-salt soft diet okay wean octreotide and if no further bleeding later today can probably go home resume her diuretics and take once a day pump inhibitors and follow-up with my partner Dr. Therisa Doyne next week and the warnings of a little self-limited melena after the bands fall off in a week or 2 was discussed and will leave the timing of repeat endoscopy to Dr. Therisa Doyne  [facility] E  Pager [phone] After 5PM or if no answer call [phone]

## 2018-02-03 NOTE — Discharge Summary (Signed)
Physician Discharge Summary  Christine Butler TKZ:[id number] DOB: 28-Jul-1970  PCP: Christine Leriche, PA-C  Admit date: 02/01/2018 Discharge date: 02/03/2018  Recommendations for Outpatient Follow-up:  1. Christine Scifres, PA-C/PCP in 3 days with repeat labs (CBC & CMP). 2. Dr. Ronnette Butler, [facility] in 1 week for hospital follow-up.  Please determine timing of resumption of Aspirin during this follow-up. 3. Patient's Aspirin 81 mg daily was discontinued during this hospitalization and needs to follow-up with GI to determine timing of resumption.  Home Health: None Equipment/Devices: None  Discharge Condition: Improved and stable CODE STATUS: Full Diet recommendation: Heart healthy diet.  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  She was admitted for further evaluation and management.  [facility] was consulted.  Assessment and plan:  1. Acute esophageal variceal bleed: She was treated with bowel rest, IV fluids, IV octreotide and pantoprazole infusions, IV ceftriaxone for SBP prophylaxis.  [facility] was consulted.  She underwent EGD with esophageal variceal banding on 02/01/2018.  Diet was gradually advanced which she has tolerated.  No further emesis since hospital admission.  Still having some melanotic stools but reportedly clearing up.  As per GI follow-up, DC home on prior home diuretics, once a day PPI.  I discussed with Christine Butler who recommended holding aspirin 81 mg daily until follow-up with Dr. Therisa Butler next week at which time determination to be made regarding resumption of aspirin and timing of repeat endoscopy.  He also recommended no  further antibiotics at discharge.  Continue prior home dose of beta-blockers.  Patient takes ibuprofen infrequently and was advised to discontinue due to risk of GI bleed.  Improved and stable. 2. Acute blood loss anemia: Due to acute upper GI bleed.  Hemoglobin had dropped to 7.1 on 12/10.  Transfused 2 units of PRBCs.  Hemoglobin appropriately up to 9.6 g per DL.  Going forward, would avoid over transfusion to avoid risk of recurrent variceal bleed. 3. Primary biliary cirrhosis with ascites: Home medications including Lasix, Aldactone and metoprolol were briefly held and will be resumed at discharge.  She follows with Christine Butler, hepatology and transplant MD at [facility].  No clinical encephalopathy.  Reportedly has chronic soft blood pressures with SBP in the 90s and asymptomatic. 4. Chronic hypotension: Reports that she usually runs SBP's in the 90s and asymptomatic.  Continue prior home medications. 5. CAD status post CABG: As stated above, aspirin 81 mg daily held as per GI recommendations until outpatient follow-up with them in 1 week.  Continue treating hyperlipidemia and continue beta-blockers. 6. Hyperlipidemia: Continue Arilocumab 7. Thrombocytopenia/leukopenia: May be part of pancytopenia related to cirrhosis but most likely due to acute blood loss.  Follow CBCs closely as outpatient.   Consultations:  [facility]  Procedures:  EGD 02/01/2018:  Impression: - Grade I esophageal varices. Probable source of bleeding 1 with white nipple sign as above which spontaneously started and stopped bleeding during the procedure banded. - Gastritis. - Normal duodenal bulb, first portion of the duodenum and second portion of the duodenum. - The examination was otherwise normal. -Specimens collected.  Recommendations: - Clear liquid diet today. Continue octreotide and Protonix  for now - Continue present medications. - Return to GI clinic in 2 weeks. Consider repeat banding in few  weeks - Telephone GI clinic if symptomatic PRN. Need to reevaluate aspirin use   Discharge Instructions  Discharge Instructions    Call MD for:   Complete by:  As directed    Recurrence of vomiting blood or dark coffee ground material, recurrent or persistent black tarry stools.   Call MD for:  difficulty breathing, headache or visual disturbances   Complete by:  As directed    Call MD for:  extreme fatigue   Complete by:  As directed    Call MD for:  persistant dizziness or light-headedness   Complete by:  As directed    Call MD for:  persistant nausea and vomiting   Complete by:  As directed    Call MD for:  severe uncontrolled pain   Complete by:  As directed    Call MD for:  temperature >100.4   Complete by:  As directed    Diet - low sodium heart healthy   Complete by:  As directed    Increase activity slowly   Complete by:  As directed        Medication List    STOP taking these medications   aspirin EC 81 MG tablet   ibuprofen 600 MG tablet Commonly known as:  ADVIL,MOTRIN   omeprazole 10 MG capsule Commonly known as:  PRILOSEC     TAKE these medications   Alirocumab 150 MG/ML Soaj Inject 1 Dose into the skin every 14 (fourteen) days.   cycloSPORINE 0.05 % ophthalmic emulsion Commonly known as:  RESTASIS Place 1 drop into both eyes daily.   DEKAS PLUS PO Take 1 capsule by mouth daily.   furosemide 20 MG tablet Commonly known as:  LASIX Take 1 tablet (20 mg total) by mouth daily.   hydrOXYzine 25 MG tablet Commonly known as:  ATARAX/VISTARIL Take 25 mg by mouth every 6 (six) hours as needed for itching.   metoprolol tartrate 25 MG tablet Commonly known as:  LOPRESSOR Take 0.5 tablets (12.5 mg total) by mouth 2 (two) times daily.   metroNIDAZOLE 0.75 % cream Commonly known as:  METROCREAM Apply 1 application topically 2 (two) times daily.   naltrexone 50 MG tablet Commonly known as:  DEPADE Take 50 mg by mouth daily as needed.    nitroGLYCERIN 0.4 MG SL tablet Commonly known as:  NITROSTAT Place 1 tablet (0.4 mg total) under the tongue every 5 (five) minutes as needed for chest pain.   pantoprazole 40 MG tablet Commonly known as:  PROTONIX Take 1 tablet (40 mg total) by mouth daily.   PROBIOTIC PO Take 1 capsule by mouth daily.   spironolactone 50 MG tablet Commonly known as:  ALDACTONE Take 50 mg by mouth daily.   temazepam 30 MG capsule Commonly known as:  RESTORIL Take 30 mg by mouth at bedtime.   triamcinolone cream 0.1 % Commonly known as:  KENALOG Apply 1 application topically 2 (two) times daily.   ursodiol 300 MG capsule Commonly known as:  ACTIGALL Take 600-900 mg by mouth See admin instructions. Take 900 mg in the morning and 600 mg in the evening      Follow-up Information    Butler, Earlie Server, PA-C. Schedule an appointment as soon as possible for a visit in 3 day(s).   Specialty:  Physician Assistant Why:  To be seen with repeat labs (CBC & CMP). Contact information: [address]  [place] [address] [phone]        Revankar, Reita Cliche, MD .   Specialty:  Cardiology Contact information: [place] [address] [place]  [place] [address] [phone]        Christine Juniper, MD. Schedule an appointment as soon as possible for a visit in 1 week(s).   Specialty:  Gastroenterology Contact information: [address] [place] [address] [phone]          Allergies  Allergen Reactions  . Codeine Nausea And Vomiting  . Erythromycin Nausea And Vomiting  . Fentanyl Nausea And Vomiting      Procedures/Studies: Dg Abd 2 Views  Result Date: 02/03/2018 CLINICAL DATA:  Abdominal distention. EXAM: ABDOMEN - 2 VIEW COMPARISON:  Limited abdomen ultrasound dated 09/07/2017. Chest radiographs and chest CT dated 11/05/2017. FINDINGS: Normal bowel gas pattern without free peritoneal air. Subsegmental atelectasis at both lung bases with a small left  pleural effusion and minimal right pleural effusion. Post CABG changes. Mild thoracolumbar scoliosis. IMPRESSION: 1. No acute abdominal abnormality. 2. Small left pleural effusion and minimal right pleural effusion. 3. Bibasilar atelectasis. Electronically Signed   By: Claudie Revering M.D.   On: 02/03/2018 02:26      Subjective: No vomiting since hospital admission.  Had 3 BMs last night and dark stools seem to be clearing up.  Feels volume overloaded with swelling of her hands, feet and abdomen.  No abdominal pain.  No dizziness, lightheadedness, chest pain or dyspnea.  Reports chronic "soft blood pressures" with SBP in the 90s.  Discharge Exam:  Vitals:   02/02/18 1727 02/02/18 2058 02/02/18 2102 02/03/18 0535  BP: (!) 96/56  110/73 (!) 99/59  Pulse: 75  80 74  Resp:   20 18  Temp: 98.1 F (36.7 C)  98.3 F (36.8 C) 98.6 F (37 C)  TempSrc: Oral  Oral Oral  SpO2:   99% 99%  Weight:  68.1 kg  69.7 kg  Height:        General: Pleasant young female, moderately built and nourished, sitting up comfortably in bed eating breakfast this morning. Cardiovascular: S1 & S2 heard, RRR, S1/S2 +. No murmurs, rubs, gallops or clicks. No JVD.  Trace bilateral ankle edema.  Telemetry personally reviewed: Sinus rhythm. Respiratory: Clear to auscultation without wheezing, rhonchi or crackles. No increased work of breathing. Abdominal:  Non distended, non tender & soft. No organomegaly or masses appreciated. Normal bowel sounds heard. CNS: Alert and oriented. No focal deficits. Extremities: no edema, no cyanosis    The results of significant diagnostics from this hospitalization (including imaging, microbiology, ancillary and laboratory) are listed below for reference.     Microbiology: No results found for this or any previous visit (from the past 240 hour(s)).   Labs: CBC: Recent Labs  Lab 02/01/18 0631 02/02/18 0549 02/02/18 1655 02/03/18 0534  WBC 5.2 4.8  --  3.9*  HGB 9.6* 7.1* 9.5*  9.6*  HCT 29.6* 22.3* 29.1* 29.4*  MCV 94.0 94.5  --  92.5  PLT 153 147*  --  433*   Basic Metabolic Panel: Recent Labs  Lab 02/01/18 0631 02/02/18 0549 02/03/18 0534  NA 135 139 141  K 3.9 4.1 3.7  CL 103 109 113*  CO2 24 21* 20*  GLUCOSE 100* 115* 94  BUN 18 21* 12  CREATININE 0.48 0.58 0.48  CALCIUM 8.1* 7.8* 7.7*   Liver Function Tests: Recent Labs  Lab 02/01/18  0631  AST 75*  ALT 37  ALKPHOS 213*  BILITOT 6.8*  PROT 5.6*  ALBUMIN 2.1*   BNP (last 3 results) Recent Labs    08/23/17 2318 11/05/17 1547  BNP 373.4* 306.0*   Urinalysis    Component Value Date/Time   COLORURINE AMBER (A) 02/01/2018 0450   APPEARANCEUR CLEAR 02/01/2018 0450   LABSPEC 1.019 02/01/2018 0450   PHURINE 6.0 02/01/2018 0450   GLUCOSEU NEGATIVE 02/01/2018 0450   HGBUR NEGATIVE 02/01/2018 0450   BILIRUBINUR MODERATE (A) 02/01/2018 0450   BILIRUBINUR small 08/04/2013 0836   KETONESUR NEGATIVE 02/01/2018 0450   PROTEINUR NEGATIVE 02/01/2018 0450   UROBILINOGEN 1.0 08/04/2013 0836   UROBILINOGEN 1.0 06/10/2008 1855   NITRITE NEGATIVE 02/01/2018 0450   LEUKOCYTESUR NEGATIVE 02/01/2018 0450    Discussed in detail with patient's significant other at bedside, updated care and answered questions.  Time coordinating discharge: 40 minutes  SIGNED:  Vernell Leep, MD, FACP, [facility]. [facility] Pager [phone]  If 7PM-7AM, please contact night-coverage [email or web address] Password [email or web address] 02/03/2018, 1:50 PM

## 2018-02-04 ENCOUNTER — Encounter (HOSPITAL_COMMUNITY): Payer: Self-pay | Admitting: Gastroenterology

## 2018-02-05 ENCOUNTER — Other Ambulatory Visit: Payer: Self-pay | Admitting: *Deleted

## 2018-02-05 MED FILL — PEG-3350 SOLUTION: 420 | 1 days supply | Qty: 4000 | Fill #0

## 2018-02-05 NOTE — Patient Outreach (Signed)
Pt hospitalized 12/9-12/11/19 for hematemesis with nausea, GI bleed, mild ascites, biliary cirrhosis, RN CM placed outreach call for transition of care, spoke with pt, HIPAA verified, pt reports " I'm doing much better"  Pt has her post hospital follow up appointments scheduled, has all medications and reports taking as prescribed, RN CM reviewed discharge instructions and reminded pt to call MD for black, tarry stools or vomiting blood. Pt reports she is to follow up with MD at [facility] and will be going through process to be placed on transplant list.    Jacqlyn Larsen [facility], BSN [place] Coordinator [phone]

## 2018-02-09 DIAGNOSIS — Z1211 Encounter for screening for malignant neoplasm of colon: Secondary | ICD-10-CM | POA: Diagnosis not present

## 2018-02-09 DIAGNOSIS — K648 Other hemorrhoids: Secondary | ICD-10-CM | POA: Diagnosis not present

## 2018-02-10 DIAGNOSIS — R0601 Orthopnea: Secondary | ICD-10-CM | POA: Diagnosis not present

## 2018-02-10 DIAGNOSIS — K743 Primary biliary cirrhosis: Secondary | ICD-10-CM | POA: Diagnosis not present

## 2018-02-10 DIAGNOSIS — D62 Acute posthemorrhagic anemia: Secondary | ICD-10-CM | POA: Diagnosis not present

## 2018-02-10 DIAGNOSIS — K922 Gastrointestinal hemorrhage, unspecified: Secondary | ICD-10-CM | POA: Diagnosis not present

## 2018-02-10 DIAGNOSIS — I2510 Atherosclerotic heart disease of native coronary artery without angina pectoris: Secondary | ICD-10-CM | POA: Diagnosis not present

## 2018-02-12 ENCOUNTER — Other Ambulatory Visit (HOSPITAL_COMMUNITY)
Admission: AD | Admit: 2018-02-12 | Discharge: 2018-02-12 | Disposition: A | Discharge status: Home / Self Care | Payer: 59 | Attending: Physician Assistant | Admitting: Physician Assistant

## 2018-02-12 DIAGNOSIS — K922 Gastrointestinal hemorrhage, unspecified: Secondary | ICD-10-CM | POA: Diagnosis not present

## 2018-02-12 LAB — CBC WITH DIFFERENTIAL/PLATELET
Basophils Absolute: 0 10*3/uL (ref 0.0–0.1)
Basophils Relative: 1 %
Eosinophils Absolute: 0.2 10*3/uL (ref 0.0–0.5)
Eosinophils Relative: 3 %
HCT: 33.6 % — ABNORMAL LOW (ref 36.0–46.0)
Hemoglobin: 11.3 g/dL — ABNORMAL LOW (ref 12.0–15.0)
Lymphocytes Relative: 23 %
Lymphs Abs: 1.6 10*3/uL (ref 0.7–4.0)
MCH: 31 pg (ref 26.0–34.0)
MCHC: 33.6 g/dL (ref 30.0–36.0)
MCV: 92.3 fL (ref 80.0–100.0)
Monocytes Absolute: 0.4 10*3/uL (ref 0.1–1.0)
Monocytes Relative: 6 %
Neutro Abs: 4.7 10*3/uL (ref 1.7–7.7)
Neutrophils Relative %: 67 %
Platelets: 228 10*3/uL (ref 150–400)
RBC: 3.64 MIL/uL — ABNORMAL LOW (ref 3.87–5.11)
RDW: 16.9 % — ABNORMAL HIGH (ref 11.5–15.5)
WBC: 6.9 10*3/uL (ref 4.0–10.5)
nRBC: 0 % (ref 0.0–0.2)

## 2018-02-12 LAB — BASIC METABOLIC PANEL
ANION GAP: 11 (ref 5–15)
BUN: 9 mg/dL (ref 6–20)
CO2: 21 mmol/L — ABNORMAL LOW (ref 22–32)
Calcium: 8.2 mg/dL — ABNORMAL LOW (ref 8.9–10.3)
Chloride: 98 mmol/L (ref 98–111)
Creatinine, Ser: 0.54 mg/dL (ref 0.44–1.00)
GFR calc Af Amer: >60 mL/min (ref >60)
GLUCOSE: 94 mg/dL (ref 70–99)
Potassium: 4.3 mmol/L (ref 3.5–5.1)
Sodium: 130 mmol/L — ABNORMAL LOW (ref 135–145)

## 2018-03-02 DIAGNOSIS — Z23 Encounter for immunization: Secondary | ICD-10-CM | POA: Diagnosis not present

## 2018-03-02 DIAGNOSIS — A630 Anogenital (venereal) warts: Secondary | ICD-10-CM | POA: Diagnosis not present

## 2018-03-02 MED FILL — PRALUENT 150 MG/ML PEN: 150 | 28 days supply | Qty: 2 | Fill #2

## 2018-03-02 MED FILL — SPIRONOLACTONE 50 MG TABS: 50 | 90 days supply | Qty: 90 | Fill #1

## 2018-03-04 MED FILL — IMIQUIMOD 5 % CREA: 5 | 30 days supply | Qty: 24 | Fill #0

## 2018-03-10 ENCOUNTER — Encounter: Payer: Self-pay | Admitting: Cardiothoracic Surgery

## 2018-03-10 ENCOUNTER — Ambulatory Visit: Payer: 59 | Admitting: Cardiothoracic Surgery

## 2018-03-10 ENCOUNTER — Telehealth: Payer: 59 | Admitting: Nurse Practitioner

## 2018-03-10 VITALS — BP 95/63 | HR 79 | Resp 20 | Ht 61.5 in | Wt 136.0 lb

## 2018-03-10 DIAGNOSIS — Z951 Presence of aortocoronary bypass graft: Secondary | ICD-10-CM | POA: Diagnosis not present

## 2018-03-10 DIAGNOSIS — K743 Primary biliary cirrhosis: Secondary | ICD-10-CM

## 2018-03-10 DIAGNOSIS — I25110 Atherosclerotic heart disease of native coronary artery with unstable angina pectoris: Secondary | ICD-10-CM | POA: Diagnosis not present

## 2018-03-10 DIAGNOSIS — J029 Acute pharyngitis, unspecified: Secondary | ICD-10-CM

## 2018-03-10 MED ORDER — AMOXICILLIN-POT CLAVULANATE 875-125 MG PO TABS: 1.0000 | ORAL_TABLET | Freq: Two times a day (BID) | ORAL | 0 refills | Status: DC

## 2018-03-10 MED FILL — AMOX-CLAV 875-125 MG TABLET: 875-125 | 7 days supply | Qty: 14 | Fill #0

## 2018-03-10 NOTE — Progress Notes (Signed)

## 2018-03-10 NOTE — Progress Notes (Signed)
PCP is Scifres, Earlie Server, PA-C Referring Provider is Jettie Booze, MD  Chief Complaint  Patient presents with  . Routine Post Op    6 month f/u HX of CABG    HPI: Patient returns for one-year follow-up after urgent CABG x 2 using arterial grafts for unstable angina, 95% stenosis of the LAD-diagonal bifurcation in January 2019.  The patient has biliary cirrhosis, progressive liver failure, and hyperlipidemia.  She is being evaluated for liver transplant at [facility].  She denies recurrent symptoms of chest pain.  She does have chest wall tenderness and neuritic type pain.  Last chest x-ray is clear and the sternal incision is well-healed.  There is 1 sternal wire in the mid incision which is easily palpable under the skin and somewhat bothersome and could be removed at the time of liver transplant surgery.  In December 2019 the patient had an upper GI bleed from esophageal varices.  She stopped her aspirin for about 3 weeks but is now taking 81 mg a day.  The varices were ligated.  Past Medical History:  Diagnosis Date  . Arthritis    right knee  . Ascites--mild this admit 11/06/2017  . CAD (coronary artery disease)    a. s/p CABGx2 in 02/2017 (LAD not suitable for PCI), EF normal.  . Familial hyperlipidemia   . GERD (gastroesophageal reflux disease)   . IBS (irritable bowel syndrome)   . PONV (postoperative nausea and vomiting)   . Primary biliary cirrhosis ([place])   . S/P CABG (coronary artery bypass graft)   . SVD (spontaneous vaginal delivery)    x 3  . Urinary tract bacterial infections     Past Surgical History:  Procedure Laterality Date  . CORONARY ARTERY BYPASS GRAFT N/A 03/24/2017   Procedure: CORONARY ARTERY BYPASS GRAFTING (CABG) x two, using left internal mammary artery, left radial artery, and right leg greater saphenous vein harvested endoscopically;  Surgeon: Ivin Poot, MD;  Location: [place];  Service: Open Heart Surgery;  Laterality: N/A;  . ENDOVEIN HARVEST OF  GREATER SAPHENOUS VEIN Right 03/24/2017   Procedure: ENDOVEIN HARVEST OF GREATER SAPHENOUS VEIN;  Surgeon: Ivin Poot, MD;  Location: [place];  Service: Open Heart Surgery;  Laterality: Right;  . ESOPHAGEAL BANDING  02/01/2018   Procedure: ESOPHAGEAL BANDING;  Surgeon: [name], MD;  Location: [facility];  Service: Gastroenterology;;  . ESOPHAGOGASTRODUODENOSCOPY (EGD) WITH PROPOFOL N/A 02/01/2018   Procedure: ESOPHAGOGASTRODUODENOSCOPY (EGD) WITH PROPOFOL;  Surgeon: [name], MD;  Location: [facility];  Service: Gastroenterology;  Laterality: N/A;  . INCONTINENCE SURGERY    . LEEP N/A 03/28/2014   Procedure: LOOP ELECTROSURGICAL EXCISION PROCEDURE (LEEP) cone biopsy;  Surgeon: Cyril Mourning, MD;  Location: [facility];  Service: Gynecology;  Laterality: N/A;  . LEFT HEART CATH AND CORONARY ANGIOGRAPHY N/A 03/23/2017   Procedure: LEFT HEART CATH AND CORONARY ANGIOGRAPHY;  Surgeon: Jettie Booze, MD;  Location: [facility];  Service: Cardiovascular;  Laterality: N/A;  . LIVER BIOPSY     x 2  . RADIAL ARTERY HARVEST Left 03/24/2017   Procedure: RADIAL ARTERY HARVEST;  Surgeon: Ivin Poot, MD;  Location: [place];  Service: Open Heart Surgery;  Laterality: Left;  . TEE WITHOUT CARDIOVERSION N/A 03/24/2017   Procedure: TRANSESOPHAGEAL ECHOCARDIOGRAM (TEE);  Surgeon: Prescott Gum, Collier Salina, MD;  Location: [place];  Service: Open Heart Surgery;  Laterality: N/A;  . WISDOM TOOTH EXTRACTION      Family History  Problem Relation Age of  Onset  . CAD Father   . Diabetes Mellitus II Father   . Heart disease Father   . Heart attack Brother   . Heart disease Maternal Aunt   . Heart attack Paternal Grandmother   . Heart attack Paternal Grandfather     Social History Social History   Tobacco Use  . Smoking status: Never Smoker  . Smokeless tobacco: Never Used  Substance Use Topics  . Alcohol use: Yes    Alcohol/week: 1.0 standard drinks    Types: 1 Glasses of wine per week     Comment: for dinner  . Drug use: No    Current Outpatient Medications  Medication Sig Dispense Refill  . Alirocumab (PRALUENT) 150 MG/ML SOAJ Inject 1 Dose into the skin every 14 (fourteen) days. 2 pen 11  . furosemide (LASIX) 20 MG tablet Take 1 tablet (20 mg total) by mouth daily. 30 tablet 6  . hydrOXYzine (ATARAX/VISTARIL) 25 MG tablet Take 25 mg by mouth every 6 (six) hours as needed for itching.     . metoprolol tartrate (LOPRESSOR) 25 MG tablet Take 0.5 tablets (12.5 mg total) by mouth 2 (two) times daily. 90 tablet 1  . metroNIDAZOLE (METROCREAM) 0.75 % cream Apply 1 application topically 2 (two) times daily.    . Multiple Vitamins-Minerals (DEKAS PLUS PO) Take 1 capsule by mouth daily.    . nitroGLYCERIN (NITROSTAT) 0.4 MG SL tablet Place 1 tablet (0.4 mg total) under the tongue every 5 (five) minutes as needed for chest pain. 25 tablet 6  . Probiotic Product (PROBIOTIC PO) Take 1 capsule by mouth daily.     Marland Kitchen spironolactone (ALDACTONE) 50 MG tablet Take 50 mg by mouth daily.   1  . temazepam (RESTORIL) 30 MG capsule Take 30 mg by mouth at bedtime.    . triamcinolone cream (KENALOG) 0.1 % Apply 1 application topically 2 (two) times daily.    . ursodiol (ACTIGALL) 300 MG capsule Take 600-900 mg by mouth See admin instructions. Take 900 mg in the morning and 600 mg in the evening     No current facility-administered medications for this visit.     Allergies  Allergen Reactions  . Codeine Nausea And Vomiting  . Erythromycin Nausea And Vomiting  . Fentanyl Nausea And Vomiting    Review of Systems   Patient's fatigue has started to improve after endoscopic ligation of esophageal varices and bleeding She has mild-moderate abdominal ascites and lower extremity edema controlled with oral diuretic No chest wall clicking or popping sensations but she does have hypersensitivity over her anterior chest from the sternotomy.  BP 95/63   Pulse 79   Resp 20   Ht 5' 1.5" (1.562 m)   Wt  136 lb (61.7 kg)   SpO2 99%   BMI 25.28 kg/m        Exam    General- alert and comfortable, icteric sclera and skin    Neck- no JVD, no cervical adenopathy palpable, no carotid bruit   Lungs- clear without rales, wheezes   Cor- regular rate and rhythm, no murmur , gallop   Abdomen- soft, non-tender, mild ascites   Extremities - warm, non-tender, mild edema   Neuro- oriented, appropriate, no focal weakness    Diagnostic Tests: None  Impression: Stable after CABG times two 1 year ago with all arterial grafts. Unfortunately her liver disease from biliary cirrhosis is progressing and she will need a liver transplant.  She can stop her aspirin for liver surgery and  for any recurrent GI bleeding. Plan: I will see the patient back in 6 months for review of progress. I would usually treat her neuritic type chest wall pain with oral Neurontin but not in the situation with abnormal hepatic function.  Len Childs, MD [facility] [phone]

## 2018-03-11 ENCOUNTER — Other Ambulatory Visit: Payer: Self-pay | Admitting: Gastroenterology

## 2018-03-11 DIAGNOSIS — G9340 Encephalopathy, unspecified: Secondary | ICD-10-CM | POA: Diagnosis not present

## 2018-03-11 DIAGNOSIS — I8500 Esophageal varices without bleeding: Secondary | ICD-10-CM | POA: Diagnosis not present

## 2018-03-11 DIAGNOSIS — K7469 Other cirrhosis of liver: Secondary | ICD-10-CM | POA: Diagnosis not present

## 2018-03-11 DIAGNOSIS — R188 Other ascites: Secondary | ICD-10-CM | POA: Diagnosis not present

## 2018-03-16 ENCOUNTER — Ambulatory Visit: Admit: 2018-03-16 | Discharge: 2018-03-16 | Payer: PRIVATE HEALTH INSURANCE

## 2018-03-16 ENCOUNTER — Institutional Professional Consult (permissible substitution): Admit: 2018-03-16 | Discharge: 2018-03-16 | Payer: PRIVATE HEALTH INSURANCE

## 2018-03-16 DIAGNOSIS — K7290 Hepatic failure, unspecified without coma: Principal | ICD-10-CM

## 2018-03-16 DIAGNOSIS — M810 Age-related osteoporosis without current pathological fracture: Secondary | ICD-10-CM | POA: Diagnosis not present

## 2018-03-16 DIAGNOSIS — K7469 Other cirrhosis of liver: Secondary | ICD-10-CM | POA: Diagnosis not present

## 2018-03-16 DIAGNOSIS — K766 Portal hypertension: Secondary | ICD-10-CM | POA: Diagnosis not present

## 2018-03-17 ENCOUNTER — Institutional Professional Consult (permissible substitution): Admit: 2018-03-17 | Discharge: 2018-03-17 | Payer: PRIVATE HEALTH INSURANCE

## 2018-03-17 ENCOUNTER — Ambulatory Visit
Admit: 2018-03-17 | Discharge: 2018-03-17 | Payer: PRIVATE HEALTH INSURANCE | Attending: Nutritionist | Primary: Nutritionist

## 2018-03-17 ENCOUNTER — Ambulatory Visit: Admit: 2018-03-17 | Discharge: 2018-03-17 | Payer: PRIVATE HEALTH INSURANCE

## 2018-03-17 ENCOUNTER — Ambulatory Visit
Admit: 2018-03-17 | Discharge: 2018-03-17 | Payer: PRIVATE HEALTH INSURANCE | Attending: Psychologist | Primary: Psychologist

## 2018-03-17 DIAGNOSIS — F4322 Adjustment disorder with anxiety: Principal | ICD-10-CM

## 2018-03-17 DIAGNOSIS — Z01818 Encounter for other preprocedural examination: Principal | ICD-10-CM

## 2018-03-17 DIAGNOSIS — K7290 Hepatic failure, unspecified without coma: Principal | ICD-10-CM

## 2018-03-17 DIAGNOSIS — I517 Cardiomegaly: Secondary | ICD-10-CM | POA: Diagnosis not present

## 2018-03-17 DIAGNOSIS — R931 Abnormal findings on diagnostic imaging of heart and coronary circulation: Secondary | ICD-10-CM | POA: Diagnosis not present

## 2018-03-17 DIAGNOSIS — Z01810 Encounter for preprocedural cardiovascular examination: Secondary | ICD-10-CM | POA: Diagnosis not present

## 2018-03-18 ENCOUNTER — Ambulatory Visit: Payer: 59 | Admitting: Internal Medicine

## 2018-03-22 ENCOUNTER — Ambulatory Visit: Admit: 2018-03-22 | Discharge: 2018-03-22 | Payer: PRIVATE HEALTH INSURANCE

## 2018-03-22 ENCOUNTER — Ambulatory Visit: Admit: 2018-03-22 | Discharge: 2018-03-23 | Payer: PRIVATE HEALTH INSURANCE

## 2018-03-22 DIAGNOSIS — R112 Nausea with vomiting, unspecified: Secondary | ICD-10-CM

## 2018-03-22 DIAGNOSIS — K743 Primary biliary cirrhosis: Secondary | ICD-10-CM

## 2018-03-22 DIAGNOSIS — K7290 Hepatic failure, unspecified without coma: Principal | ICD-10-CM

## 2018-03-22 DIAGNOSIS — Z9889 Other specified postprocedural states: Secondary | ICD-10-CM

## 2018-03-22 DIAGNOSIS — Z01818 Encounter for other preprocedural examination: Secondary | ICD-10-CM

## 2018-03-22 DIAGNOSIS — Z951 Presence of aortocoronary bypass graft: Secondary | ICD-10-CM

## 2018-03-22 DIAGNOSIS — Z8719 Personal history of other diseases of the digestive system: Secondary | ICD-10-CM

## 2018-03-22 DIAGNOSIS — K7460 Unspecified cirrhosis of liver: Secondary | ICD-10-CM | POA: Diagnosis not present

## 2018-03-22 DIAGNOSIS — K766 Portal hypertension: Secondary | ICD-10-CM | POA: Diagnosis not present

## 2018-03-22 DIAGNOSIS — M8580 Other specified disorders of bone density and structure, unspecified site: Secondary | ICD-10-CM | POA: Diagnosis not present

## 2018-03-22 DIAGNOSIS — Z7982 Long term (current) use of aspirin: Secondary | ICD-10-CM | POA: Diagnosis not present

## 2018-03-22 DIAGNOSIS — K449 Diaphragmatic hernia without obstruction or gangrene: Secondary | ICD-10-CM | POA: Diagnosis not present

## 2018-03-22 DIAGNOSIS — K8309 Other cholangitis: Secondary | ICD-10-CM | POA: Diagnosis not present

## 2018-03-22 DIAGNOSIS — Z79899 Other long term (current) drug therapy: Secondary | ICD-10-CM | POA: Diagnosis not present

## 2018-03-22 DIAGNOSIS — I2510 Atherosclerotic heart disease of native coronary artery without angina pectoris: Secondary | ICD-10-CM | POA: Diagnosis not present

## 2018-03-24 DIAGNOSIS — D485 Neoplasm of uncertain behavior of skin: Secondary | ICD-10-CM | POA: Diagnosis not present

## 2018-03-24 DIAGNOSIS — Z23 Encounter for immunization: Secondary | ICD-10-CM | POA: Diagnosis not present

## 2018-03-24 DIAGNOSIS — C44311 Basal cell carcinoma of skin of nose: Secondary | ICD-10-CM | POA: Diagnosis not present

## 2018-03-24 DIAGNOSIS — A630 Anogenital (venereal) warts: Secondary | ICD-10-CM | POA: Diagnosis not present

## 2018-03-28 DIAGNOSIS — K922 Gastrointestinal hemorrhage, unspecified: Secondary | ICD-10-CM | POA: Diagnosis not present

## 2018-03-28 DIAGNOSIS — I8500 Esophageal varices without bleeding: Secondary | ICD-10-CM | POA: Diagnosis not present

## 2018-03-28 DIAGNOSIS — K766 Portal hypertension: Secondary | ICD-10-CM | POA: Diagnosis not present

## 2018-03-28 DIAGNOSIS — K3189 Other diseases of stomach and duodenum: Secondary | ICD-10-CM | POA: Diagnosis not present

## 2018-03-29 ENCOUNTER — Encounter (HOSPITAL_COMMUNITY): Payer: Self-pay | Admitting: *Deleted

## 2018-03-29 ENCOUNTER — Encounter (HOSPITAL_COMMUNITY)
Admission: RE | Disposition: A | Discharge status: Home / Self Care | Payer: Self-pay | Source: Home / Self Care | Attending: Gastroenterology

## 2018-03-29 ENCOUNTER — Ambulatory Visit (HOSPITAL_COMMUNITY): Payer: 59 | Admitting: Anesthesiology

## 2018-03-29 ENCOUNTER — Ambulatory Visit (HOSPITAL_COMMUNITY)
Admission: RE | Admit: 2018-03-29 | Discharge: 2018-03-29 | Disposition: A | Discharge status: Home / Self Care | Payer: 59 | Attending: Gastroenterology | Admitting: Gastroenterology

## 2018-03-29 ENCOUNTER — Other Ambulatory Visit: Payer: Self-pay

## 2018-03-29 DIAGNOSIS — R188 Other ascites: Secondary | ICD-10-CM | POA: Insufficient documentation

## 2018-03-29 DIAGNOSIS — M8580 Other specified disorders of bone density and structure, unspecified site: Secondary | ICD-10-CM | POA: Diagnosis not present

## 2018-03-29 DIAGNOSIS — Z01818 Encounter for other preprocedural examination: Secondary | ICD-10-CM | POA: Diagnosis not present

## 2018-03-29 DIAGNOSIS — K3189 Other diseases of stomach and duodenum: Secondary | ICD-10-CM | POA: Diagnosis not present

## 2018-03-29 DIAGNOSIS — I2510 Atherosclerotic heart disease of native coronary artery without angina pectoris: Secondary | ICD-10-CM | POA: Insufficient documentation

## 2018-03-29 DIAGNOSIS — K766 Portal hypertension: Secondary | ICD-10-CM | POA: Insufficient documentation

## 2018-03-29 DIAGNOSIS — Z79899 Other long term (current) drug therapy: Secondary | ICD-10-CM | POA: Insufficient documentation

## 2018-03-29 DIAGNOSIS — Z7982 Long term (current) use of aspirin: Secondary | ICD-10-CM | POA: Diagnosis not present

## 2018-03-29 DIAGNOSIS — I8501 Esophageal varices with bleeding: Secondary | ICD-10-CM | POA: Diagnosis not present

## 2018-03-29 DIAGNOSIS — K7290 Hepatic failure, unspecified without coma: Secondary | ICD-10-CM | POA: Diagnosis not present

## 2018-03-29 DIAGNOSIS — K7469 Other cirrhosis of liver: Secondary | ICD-10-CM | POA: Diagnosis not present

## 2018-03-29 DIAGNOSIS — Z951 Presence of aortocoronary bypass graft: Secondary | ICD-10-CM | POA: Diagnosis not present

## 2018-03-29 DIAGNOSIS — G9340 Encephalopathy, unspecified: Secondary | ICD-10-CM | POA: Diagnosis not present

## 2018-03-29 DIAGNOSIS — K922 Gastrointestinal hemorrhage, unspecified: Secondary | ICD-10-CM | POA: Insufficient documentation

## 2018-03-29 DIAGNOSIS — I8500 Esophageal varices without bleeding: Secondary | ICD-10-CM | POA: Insufficient documentation

## 2018-03-29 DIAGNOSIS — E785 Hyperlipidemia, unspecified: Secondary | ICD-10-CM | POA: Diagnosis not present

## 2018-03-29 DIAGNOSIS — K219 Gastro-esophageal reflux disease without esophagitis: Secondary | ICD-10-CM | POA: Diagnosis not present

## 2018-03-29 HISTORY — PX: ESOPHAGEAL BANDING: SHX5518

## 2018-03-29 HISTORY — PX: ESOPHAGOGASTRODUODENOSCOPY: SHX5428

## 2018-03-29 SURGERY — EGD (ESOPHAGOGASTRODUODENOSCOPY)
Anesthesia: Monitor Anesthesia Care

## 2018-03-29 MED ORDER — PROPOFOL 10 MG/ML IV BOLUS
INTRAVENOUS | Status: DC | PRN
  Administered 2018-03-29: 20 mg via INTRAVENOUS
  Administered 2018-03-29: 40 mg via INTRAVENOUS
  Administered 2018-03-29: 20 mg via INTRAVENOUS

## 2018-03-29 MED ORDER — METOPROLOL TARTRATE 12.5 MG HALF TABLET: 12.5000 mg | ORAL_TABLET | Freq: Two times a day (BID) | ORAL | Status: DC

## 2018-03-29 MED ORDER — ONDANSETRON HCL 4 MG/2ML IJ SOLN
INTRAMUSCULAR | Status: DC | PRN
  Administered 2018-03-29: 4 mg via INTRAVENOUS

## 2018-03-29 MED ORDER — SPIRONOLACTONE 25 MG PO TABS: 50.0000 mg | ORAL_TABLET | Freq: Every day | ORAL | Status: DC

## 2018-03-29 MED ORDER — HYDROXYZINE HCL 25 MG PO TABS: 25.0000 mg | ORAL_TABLET | Freq: Four times a day (QID) | ORAL | Status: DC | PRN

## 2018-03-29 MED ORDER — FENTANYL CITRATE (PF) 100 MCG/2ML IJ SOLN: 25.0000 ug | INTRAMUSCULAR | Status: DC | PRN

## 2018-03-29 MED ORDER — SODIUM CHLORIDE 0.9 % IV SOLN: INTRAVENOUS | Status: DC

## 2018-03-29 MED ORDER — DEKAS PLUS PO CAPS: ORAL_CAPSULE | Freq: Every day | ORAL | Status: DC

## 2018-03-29 MED ORDER — HYDROMORPHONE HCL 1 MG/ML IJ SOLN
1.0000 mg | Freq: Once | INTRAMUSCULAR | Status: AC
  Administered 2018-03-29: 1 mg via INTRAVENOUS

## 2018-03-29 MED ORDER — IMIQUIMOD 5 % EX CREA: 1.0000 "application " | TOPICAL_CREAM | CUTANEOUS | Status: DC

## 2018-03-29 MED ORDER — FUROSEMIDE 20 MG PO TABS: 20.0000 mg | ORAL_TABLET | Freq: Every day | ORAL | Status: DC

## 2018-03-29 MED ORDER — TRIAMCINOLONE ACETONIDE 0.1 % EX CREA: 1.0000 "application " | TOPICAL_CREAM | Freq: Two times a day (BID) | CUTANEOUS | Status: DC

## 2018-03-29 MED ORDER — NITROGLYCERIN 0.4 MG SL SUBL: 0.4000 mg | SUBLINGUAL_TABLET | SUBLINGUAL | Status: DC | PRN

## 2018-03-29 MED ORDER — URSODIOL 300 MG PO CAPS: 300.0000 mg | ORAL_CAPSULE | Freq: Three times a day (TID) | ORAL | Status: DC

## 2018-03-29 MED ORDER — PROBIOTIC PO CAPS: ORAL_CAPSULE | Freq: Every day | ORAL | Status: DC

## 2018-03-29 MED ORDER — CALCIUM CARBONATE ANTACID 500 MG PO CHEW: 2.0000 | CHEWABLE_TABLET | Freq: Every day | ORAL | Status: DC

## 2018-03-29 MED ORDER — PROPOFOL 500 MG/50ML IV EMUL
INTRAVENOUS | Status: DC | PRN
  Administered 2018-03-29: 250 ug/kg/min via INTRAVENOUS

## 2018-03-29 MED ORDER — HYDROMORPHONE HCL 1 MG/ML IJ SOLN
INTRAMUSCULAR | Status: AC
  Filled 2018-03-29: qty 1

## 2018-03-29 MED ORDER — MELATONIN 3 MG PO CAPS: 6.0000 mg | ORAL_CAPSULE | Freq: Every evening | ORAL | Status: DC | PRN

## 2018-03-29 MED ORDER — METRONIDAZOLE 0.75 % EX CREA: 1.0000 "application " | TOPICAL_CREAM | Freq: Two times a day (BID) | CUTANEOUS | Status: DC

## 2018-03-29 MED ORDER — LIDOCAINE 2% (20 MG/ML) 5 ML SYRINGE
INTRAMUSCULAR | Status: DC | PRN
  Administered 2018-03-29: 20 mg via INTRAVENOUS

## 2018-03-29 MED ORDER — ONDANSETRON HCL 4 MG/2ML IJ SOLN: 4.0000 mg | Freq: Once | INTRAMUSCULAR | Status: DC | PRN

## 2018-03-29 MED ORDER — ALIROCUMAB 150 MG/ML ~~LOC~~ SOAJ: 1.0000 | SUBCUTANEOUS | Status: DC

## 2018-03-29 MED ORDER — LACTATED RINGERS IV SOLN
INTRAVENOUS | Status: DC
  Administered 2018-03-29: 1000 mL via INTRAVENOUS

## 2018-03-29 NOTE — Transfer of Care (Signed)
Immediate Anesthesia Transfer of Care Note  Patient: Christine Butler  Procedure(s) Performed: ESOPHAGOGASTRODUODENOSCOPY (EGD) (N/A ) ESOPHAGEAL BANDING (N/A )  Patient Location: PACU  Anesthesia Type:MAC  Level of Consciousness: awake, alert  and oriented  Airway & Oxygen Therapy: Patient Spontanous Breathing and Patient connected to nasal cannula oxygen  Post-op Assessment: Report given to RN and Post -op Vital signs reviewed and stable  Post vital signs: Reviewed and stable  Last Vitals:  Vitals Value Taken Time  BP    Temp    Pulse 89 03/29/2018  1:26 PM  Resp 11 03/29/2018  1:26 PM  SpO2 100 % 03/29/2018  1:26 PM  Vitals shown include unvalidated device data.  Last Pain:  Vitals:   03/29/18 1229  TempSrc: Oral  PainSc:          Complications: No apparent anesthesia complications

## 2018-03-29 NOTE — Brief Op Note (Signed)
03/29/2018  1:18 PM  PATIENT:  Christine Butler  48 y.o. female  PRE-OPERATIVE DIAGNOSIS:  Esophageal varices determined by endoscopy  POST-OPERATIVE DIAGNOSIS:  * No post-op diagnosis entered *  PROCEDURE:  Procedure(s): ESOPHAGOGASTRODUODENOSCOPY (EGD) (N/A) ESOPHAGEAL BANDING (N/A)  SURGEON:  Surgeon(s) and Role:    [name], MD - Primary  PHYSICIAN ASSISTANT:   ASSISTANTS: Cleda Daub, RN, Virgel Manifold  ANESTHESIA:   MAC  EBL:  minimal  BLOOD ADMINISTERED:none  DRAINS: none   LOCAL MEDICATIONS USED:  NONE  SPECIMEN:  No Specimen  DISPOSITION OF SPECIMEN:  N/A  COUNTS:  YES  TOURNIQUET:  * No tourniquets in log *  DICTATION: .Dragon Dictation  PLAN OF CARE: Discharge to home after PACU  PATIENT DISPOSITION:  PACU - hemodynamically stable.   Delay start of Pharmacological VTE agent (>24hrs) due to surgical blood loss or risk of bleeding: no

## 2018-03-29 NOTE — Anesthesia Procedure Notes (Signed)
Procedure Name: MAC Date/Time: 03/29/2018 1:01 PM Performed by: Maxwell Caul, CRNA Pre-anesthesia Checklist: Patient identified, Emergency Drugs available, Suction available and Patient being monitored Oxygen Delivery Method: Simple face mask

## 2018-03-29 NOTE — Discharge Instructions (Signed)
Clear liquid diet for 6 hours, then regular diet. Repeat EGD in 6 weeks for banding.  YOU HAD AN ENDOSCOPIC PROCEDURE TODAY: Refer to the procedure report and other information in the discharge instructions given to you for any specific questions about what was found during the examination. If this information does not answer your questions, please call [facility] office at [phone] to clarify.   YOU SHOULD EXPECT: Some feelings of bloating in the abdomen. Passage of more gas than usual. Walking can help get rid of the air that was put into your GI tract during the procedure and reduce the bloating. If you had a lower endoscopy (such as a colonoscopy or flexible sigmoidoscopy) you may notice spotting of blood in your stool or on the toilet paper. Some abdominal soreness may be present for a day or two, also.  DIET: Your first meal following the procedure should be a light meal and then it is ok to progress to your normal diet. A half-sandwich or bowl of soup is an example of a good first meal. Heavy or fried foods are harder to digest and may make you feel nauseous or bloated. Drink plenty of fluids but you should avoid alcoholic beverages for 24 hours. If you had a esophageal dilation, please see attached instructions for diet.   ACTIVITY: Your care partner should take you home directly after the procedure. You should plan to take it easy, moving slowly for the rest of the day. You can resume normal activity the day after the procedure however YOU SHOULD NOT DRIVE, use power tools, machinery or perform tasks that involve climbing or major physical exertion for 24 hours (because of the sedation medicines used during the test).   SYMPTOMS TO REPORT IMMEDIATELY: A gastroenterologist can be reached at any hour. Please call [phone]  for any of the following symptoms:   Following lower endoscopy (colonoscopy, flexible sigmoidoscopy) Excessive amounts of blood in the stool  Significant tenderness,  worsening of abdominal pains  Swelling of the abdomen that is new, acute  Fever of 100 or higher   Following upper endoscopy (EGD, EUS, ERCP, esophageal dilation) Vomiting of blood or coffee ground material  New, significant abdominal pain  New, significant chest pain or pain under the shoulder blades  Painful or persistently difficult swallowing  New shortness of breath  Black, tarry-looking or red, bloody stools  FOLLOW UP:  If any biopsies were taken you will be contacted by phone or by letter within the next 1-3 weeks. Call [phone]  if you have not heard about the biopsies in 3 weeks.  Please also call with any specific questions about appointments or follow up tests.

## 2018-03-29 NOTE — Anesthesia Preprocedure Evaluation (Signed)
Anesthesia Evaluation  Patient identified by MRN, date of birth, ID band Patient awake    Reviewed: Allergy & Precautions, NPO status , Patient's Chart, lab work & pertinent test results  Airway Mallampati: II  TM Distance: >3 FB Neck ROM: Full    Dental no notable dental hx.    Pulmonary neg pulmonary ROS,    Pulmonary exam normal breath sounds clear to auscultation       Cardiovascular + CAD and + CABG  Normal cardiovascular exam Rhythm:Regular Rate:Normal     Neuro/Psych negative neurological ROS  negative psych ROS   GI/Hepatic negative GI ROS, (+) Cirrhosis   Esophageal Varices    ,   Endo/Other  negative endocrine ROS  Renal/GU negative Renal ROS  negative genitourinary   Musculoskeletal negative musculoskeletal ROS (+)   Abdominal   Peds negative pediatric ROS (+)  Hematology negative hematology ROS (+)   Anesthesia Other Findings   Reproductive/Obstetrics negative OB ROS                             Anesthesia Physical Anesthesia Plan  ASA: III  Anesthesia Plan: MAC   Post-op Pain Management:    Induction: Intravenous  PONV Risk Score and Plan:   Airway Management Planned: Nasal Cannula  Additional Equipment:   Intra-op Plan:   Post-operative Plan:   Informed Consent: I have reviewed the patients History and Physical, chart, labs and discussed the procedure including the risks, benefits and alternatives for the proposed anesthesia with the patient or authorized representative who has indicated his/her understanding and acceptance.     Dental advisory given  Plan Discussed with: CRNA and Surgeon  Anesthesia Plan Comments:         Anesthesia Quick Evaluation

## 2018-03-29 NOTE — H&P (Signed)
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  She complains of regrowth of anal warts and has an upcoming appointment for further laser removal, she has been using an ointment 3 times a week. She complains of epigastric discomfort which she believes is related to rectus diastasis. She reports reversal asleep cycle, multiple episodes of forgetfulness, denies constipation or blood in stool, denies worsening abdominal distention but has intermittent ankle swelling. She has an upcoming appointment at [facility] next week for further workup/evaluation for liver transplant. She complains of severe fatigue.   Current Medications  Taking   Alirocumab 75 MG/ML Solution Auto-injector as directed Subcutaneous , Notes: listed in epic   DEKAs Plus - Capsule as directed Orally   Metronidazole 0.75 % Cream External   MVI 1 tablet oral daily   Nitroglycerin 0.4 MG Tablet Sublingual as directed Sublingual , Notes: listed in epic   Probiotic - Capsule 1 capsule Orally once a day, Notes: LIsted in Epic   Spironolactone 50 MG Tablet 1 tablet with food Orally Once a day   Triamcinolone Acetonide 0.1 % Cream External   Ursodiol 300 MG Capsule 3 tablets am/2 tablets pm Orally twice a day   Metoprolol Succinate 25 MG Capsule ER 24 Hour Sprinkle 1/2 capsule Orally bid   Aspir-Low(Aspirin) 81 MG Tablet Delayed Release 1 tablet Orally Once a day   HydrOXYzine HCl 25 MG Tablet 1 tablet as needed for pruritus Orally every 6 hrs   Furosemide 20 MG Tablet 1 tablet Orally Once a day   Not-Taking   Anusol-HC(Hydrocortisone Acetate) 25 MG Suppository 1 suppository Rectal twice a day   Calcium Tablet 1200 mg 1 tablet with food Orally once a day   CycloSPORINE 0.05 % Emulsion 1 drop into both eyes Ophthalmic once a day, Notes:  Listed in Epic   DiphenhydrAMINE HCl 25 MG Capsule 2 capsules at bedtime as needed for itching Orally once a day, Notes: LIsted in Epic   Hydrocortisone 2.5 % Lotion 1 application to affected area Externally Twice a day   Hydrocortisone Cream 2.5% cream apply topical a the anal area three times a day   Ibuprofen 600 MG Tablet 1 tablet as needed for headache, mild pain or moderate pain Orally every 6 hours, Notes: Hospital   Naltrexone HCl 50 MG Tablet 1 tablet Orally Once a day, Notes: listed in epic   Omeprazole 20 MG Capsule Delayed Release 1 capsule Orally twice a day, Notes: as needed   Restoril(Temazepam) 30 MG Capsule 1 capsule at bedtime as needed Orally Once a day   Discontinued   Pantoprazole Sodium 40 MG Tablet Delayed Release 1 tablet Orally Once a day   Medication List reviewed and reconciled with the patient    Past Medical History  cirrhosis (PBC) - Dr. Therisa Doyne.   hx of UTI.   Jaundice.   Osteopenia.   Abdominal pain.   Hiatal hernia, small.   Esophagitis.   CAD s/p CABG x 2- Dr. Geraldo Pitter, cardiology.   ob/gyn - Dr. Candie Mile.   Primary biliary cholangitis.    Surgical History  bladder sling 2009  wisdom teeth extraction in her 6s  EGD-01/21/17, 2019   CABG- with double bypass 02/2017  LEEP 2014   Family History  Father: alive, diabetes, HTN,heart attack,high cholesterol, diagnosed with Diabetes, Hypertension  Mother: alive, oschemic colitis, esophageal stricture,high  cholesterol, Hypertension  Paternal Grand Father: deceased  Paternal Grand Mother: deceased  Maternal Grand Father: deceased, Colon cancer  Maternal Grand Mother: deceased  Maternal uncle: deceased, Colon cancer  1 sister(s) . 2 son(s) , 1 daughter(s) .   neg family hx of colon polyps or liver disease.   Social History  General:  no EXPOSURE TO PASSIVE SMOKE.  Alcohol: yes, 1-2 times per every few months, wine.  Children: 3.  Seat belt use: yes.  Tobacco use  cigarettes: Never  smoked Tobacco history last updated 03/11/2018 Religion: Christian.  Marital Status: married.  no Recreational drug use.  OCCUPATION: Radiology/CT Tech..  Exercise: cardiac rehab, 3 times per week.    Allergies  Codeine (for allergy): nausea and vomitting - Allergy  e-mycin: N&A - Allergy  Fentanyl: vomiting - Side Effects   Hospitalization/Major Diagnostic Procedure  Chest/Arm Pain (Esophageal Spasms) 10/2016  open heart surgery 02/2017  childbirth x3   Chest Pain 11/05/2017  acute upper GI bleed 01/2018  Not since last visit 02/2018   Review of Systems  GI PROCEDURE:  no Pacemaker/ AICD, no. no Artificial heart valves. no MI/heart attack. no Abnormal heart rhythm. no Angina. no CVA. no Hypertension. no Hypotension. no Asthma, COPD. no Sleep apnea. no Seizure disorders. no Artificial joints. no Severe DJD. no Diabetes. no Significant headaches. no Vertigo. no Depression/anxiety. no Abnormal bleeding. no Kidney Disease. no Liver disease, no. no Chance of pregnancy. Blood transfusion yes.       Vital Signs  Wt 140.6, Wt change -5.4 lb, Ht 61, BMI 26.56, Temp 98.4, Pulse sitting 77, BP sitting 104/65.   Examination  Gastroenterology:: GENERAL APPEARANCE: Well developed, well nourished, no active distress, pleasant.  EYES: Lids normal, ICTERIC.  ORAL CAVITY: Lips, teeth and gums are normal. Pharynx, tongue, mucosa icteric.  NECK Full ROM, trachea midline, no thyromegaly or masses .  CARDIOVASCULAR RRR no murmur, Normal RRR w/o murmers or gallops. No peripheral edema .  RESPIRATORY Breath sounds normal. Respiration even and unlabored .  ABDOMEN No masses palpated. Liver and spleen not palpated, normal. Bowel sounds normal, Abdomen not distended .  EXTREMITIES: No edema, pulses intact .  NEURO: normal strength, normal gait .  PSYCH: mood/affect normal .     Assessments   1. 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 Other cirrhosis of liver - K74.69    3. Ascites of liver - R18.8   4. Encephalopathy - G93.40   Treatment  1. 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   Notes: She is on metoprolol, HR is around 77 per minute, cannot increase the dosage due to hypertension. Recommend repeating EGD for further banding of varices. .  Referral To: Reason:EGD w/banding-propofol-spoke with Kim-[facility]-#[id number]    2. Other cirrhosis of liver  Notes: She is going to be evaluated at [facility] next week with labs, will follow-up results. Reports a recent strep infection, as been started on Augmentin since yesterday. Advised patients that if there is worsening yellowish discoloration of skin/sclera or change in the color of stool or urine, to notify me immediately.    3. Ascites of liver  Notes: Advised to continue furosemide 20 milligrams a day and spironolactone 50 milligrams a day, continue low-salt diet.    4. Encephalopathy  Notes: Advised to start lactulose 15  ML per day, to increase the dosage if needed, patient needs to have around 3 semi soft bowel movements a day.

## 2018-03-29 NOTE — Op Note (Signed)
[facility] Patient Name: Christine Butler Procedure Date: 03/29/2018 MRN: [id number] Attending MD: [name] , MD Date of Birth: [birth year] CSN: [id number] Age: 48 Admit Type: Outpatient Procedure:                Upper GI endoscopy Indications:              For therapy of esophageal varices Providers:                [name], MD, Cleda Daub, RN, Tinnie Gens,                            Technician, Virgia Land, CRNA Referring MD:              Medicines:                Monitored Anesthesia Care Complications:            No immediate complications. Estimated blood loss:                            None. Estimated Blood Loss:     Estimated blood loss was minimal. Procedure:                Pre-Anesthesia Assessment:                           - Prior to the procedure, a History and Physical                            was performed, and patient medications and                            allergies were reviewed. The patient's tolerance of                            previous anesthesia was also reviewed. The risks                            and benefits of the procedure and the sedation                            options and risks were discussed with the patient.                            All questions were answered, and informed consent                            was obtained. Prior Anticoagulants: The patient has                            taken no previous anticoagulant or antiplatelet                            agents. ASA Grade Assessment: III - A patient with  severe systemic disease. After reviewing the risks                            and benefits, the patient was deemed in                            satisfactory condition to undergo the procedure.                           After obtaining informed consent, the endoscope was                            passed under direct vision. Throughout the                            procedure, the  patient's blood pressure, pulse, and                            oxygen saturations were monitored continuously. The                            GIF-H190 ([id number]) Olympus gastroscope was                            introduced through the mouth, and advanced to the                            second part of duodenum. The upper GI endoscopy was                            accomplished without difficulty. The patient                            tolerated the procedure well. Scope In: Scope Out: Findings:      Grade III varices were found in the lower third of the esophagus. They       were medium in size. Four bands were successfully placed with complete       eradication, resulting in deflation of varices. There was no bleeding at       the end of the procedure.      The Z-line was regular and was found 35 cm from the incisors.      Mild portal hypertensive gastropathy was found in the gastric body.      The cardia and gastric fundus were normal on retroflexion. No evidence       of gastric varices.      Localized mildly friable mucosa with minimal self limited oozing due to       trauma was found in the duodenal bulb.      The first portion of the duodenum and second portion of the duodenum       were normal. Impression:               - Grade III esophageal varices. Completely  eradicated. Banded.                           - Z-line regular, 35 cm from the incisors.                           - Portal hypertensive gastropathy.                           - Bleeding friable duodenal mucosa.                           - Normal first portion of the duodenum and second                            portion of the duodenum.                           - No specimens collected. Moderate Sedation:      Patient did not receive moderate sedation for this procedure, but       instead received monitored anesthesia care. Recommendation:           - Patient has a contact number  available for                            emergencies. The signs and symptoms of potential                            delayed complications were discussed with the                            patient. Return to normal activities tomorrow.                            Written discharge instructions were provided to the                            patient.                           - Clear liquid diet for 6 hours.                           - Continue present medications.                           - Repeat upper endoscopy in 6 weeks for retreatment. Procedure Code(s):        --- Professional ---                           [phone], Esophagogastroduodenoscopy, flexible,                            transoral; with band ligation of esophageal/gastric  varices Diagnosis Code(s):        --- Professional ---                           I85.00, Esophageal varices without bleeding                           K76.6, Portal hypertension                           K31.89, Other diseases of stomach and duodenum                           K92.2, Gastrointestinal hemorrhage, unspecified CPT copyright 2018 American Medical Association. All rights reserved. The codes documented in this report are preliminary and upon coder review may  be revised to meet current compliance requirements. [name], MD 03/29/2018 1:24:02 PM This report has been signed electronically. Number of Addenda: 0

## 2018-03-30 ENCOUNTER — Encounter (HOSPITAL_COMMUNITY): Payer: Self-pay | Admitting: Gastroenterology

## 2018-03-30 DIAGNOSIS — Z1231 Encounter for screening mammogram for malignant neoplasm of breast: Secondary | ICD-10-CM | POA: Diagnosis not present

## 2018-03-30 LAB — LIPID PANEL
Chol/HDL Ratio: 18.6 ratio — ABNORMAL HIGH (ref 0.0–4.4)
Cholesterol, Total: 279 mg/dL — ABNORMAL HIGH (ref 100–199)
HDL: 15 mg/dL — ABNORMAL LOW (ref >39)
LDL CALC: 231 mg/dL — AB (ref 0–99)
Triglycerides: 165 mg/dL — ABNORMAL HIGH (ref 0–149)
VLDL CHOLESTEROL CAL: 33 mg/dL (ref 5–40)

## 2018-03-30 NOTE — Anesthesia Postprocedure Evaluation (Signed)
Anesthesia Post Note  Patient: [name]  Procedure(s) Performed: ESOPHAGOGASTRODUODENOSCOPY (EGD) (N/A ) ESOPHAGEAL BANDING (N/A )     Patient location during evaluation: PACU Anesthesia Type: MAC Level of consciousness: awake and alert Pain management: pain level controlled Vital Signs Assessment: post-procedure vital signs reviewed and stable Respiratory status: spontaneous breathing, nonlabored ventilation, respiratory function stable and patient connected to nasal cannula oxygen Cardiovascular status: stable and blood pressure returned to baseline Postop Assessment: no apparent nausea or vomiting Anesthetic complications: no    Last Vitals:  Vitals:   03/29/18 1400 03/29/18 1410  BP: 109/61 (!) 110/58  Pulse: 78 77  Resp: 11 11  Temp:    SpO2: 100% 100%    Last Pain:  Vitals:   03/29/18 1410  TempSrc:   PainSc: 1                  Damyen Knoll S

## 2018-03-31 MED FILL — FUROSEMIDE 20 MG TABS: 20 | 90 days supply | Qty: 90 | Fill #1

## 2018-03-31 MED FILL — PRALUENT 150 MG/ML PEN: 150 | 28 days supply | Qty: 2 | Fill #3

## 2018-04-01 ENCOUNTER — Encounter: Payer: Self-pay | Admitting: Internal Medicine

## 2018-04-01 ENCOUNTER — Ambulatory Visit: Payer: 59 | Admitting: Internal Medicine

## 2018-04-01 VITALS — BP 90/62 | HR 65 | Ht 61.5 in | Wt 137.2 lb

## 2018-04-01 DIAGNOSIS — E785 Hyperlipidemia, unspecified: Secondary | ICD-10-CM | POA: Diagnosis not present

## 2018-04-01 DIAGNOSIS — E7849 Other hyperlipidemia: Secondary | ICD-10-CM | POA: Diagnosis not present

## 2018-04-01 DIAGNOSIS — Z951 Presence of aortocoronary bypass graft: Secondary | ICD-10-CM | POA: Diagnosis not present

## 2018-04-01 DIAGNOSIS — K743 Primary biliary cirrhosis: Secondary | ICD-10-CM

## 2018-04-01 NOTE — Patient Instructions (Signed)
Medication Instructions:  Continue current medications  If you need a refill on your cardiac medications before your next appointment, please call your pharmacy.  Labwork: Lipid Profile HERE IN OUR OFFICE AT [facility]  You will need to fast. DO NOT EAT OR DRINK PAST MIDNIGHT.       Take the provided lab slips with you to the lab for your blood draw.   When you have your labs (blood work) drawn today and your tests are completely normal, you will receive your results only by MyChart Message (if you have MyChart) -OR-  A paper copy in the mail.  If you have any lab test that is abnormal or we need to change your treatment, we will call you to review these results.  Testing/Procedures: None Ordered   Follow-Up: You will need a follow up appointment in 6 months.  Please call our office 2 months in advance to schedule this appointment.  You may see Dr Debara Pickett or one of the following Advanced Practice Providers on your designated Care Team: Almyra Deforest, [place] . Fabian Sharp, PA-C    At [facility], you and your health needs are our priority.  As part of our continuing mission to provide you with exceptional heart care, we have created designated Provider Care Teams.  These Care Teams include your primary Cardiologist (physician) and Advanced Practice Providers (APPs -  Physician Assistants and Nurse Practitioners) who all work together to provide you with the care you need, when you need it.   Thank you for choosing [facility] at [facility]!!

## 2018-04-01 NOTE — Progress Notes (Signed)
OFFICE CONSULT NOTE  Chief Complaint:  Follow-up dyslipidemia  Primary Care Physician: Scifres, Earlie Server, PA-C  HPI:  Christine Butler is a 48 y.o. female who is being seen today for the evaluation of dyslipidemia at the request of Dr. Prescott Gum / Revenkar. This is a pleasant 48 year old female who unfortunately underwent urgent coronary bypass surgery in January 2019 after complaining of symptoms concerning for unstable angina.  She was found to have two-vessel coronary disease and underwent LIMA to Diagonal and left free radial artery graft to the LAD with interposition at the proximal anastamosis).  Other medical problems include PBC, IBS and GERD.  She has recently been followed by Dr. [name], with hepatology and liver transplant service at [facility].  She has persistent jaundice with elevated liver enzymes.  There is also strong family history of heart disease.  Her father had two-vessel bypass at age 27.  She had a brother died of MI.  Her paternal grandfather and grandmother both had MIs in their 59s.  She also has an aunt who has multiple cardiac issues.  She does have one sister who has normal cholesterol.  In addition she has 3 children aged 45, 60 and 46, none of which you have had cholesterol testing.  Most recently her lipid profile showed a total cholesterol 449, HDL of 11, triglycerides 333 and LDL 371.  Obviously a very abnormal lipid profile concerning for HeFH.  12/17/2017  Christine Butler returns today for follow-up.  In the interim she has been seen by her hepatologist at [facility].  She underwent a fibro-scan of the liver which suggested cirrhosis.  She has follow-up with him fairly soon to discuss further work-up options but is contemplating liver transplant.  She says she has several friends and a sister who is considering a living related donor option if she were a Orthoptist.  This obviously has the potential to perhaps care her familial hyperlipidemia however in the interim  awaiting a possible transplant she has significant increased cardiovascular risk.  We again reviewed the options of treatment including a less hepatically metabolized statin, obviously there is still risk for worsening liver enzymes, or PCSK9 inhibitor therapy.  PCSK9 inhibitor therapy in the interim would be likely the best tolerated therapy with the most significant reduction in cholesterol.  Of course this would be at the blessing of her hepatologist who I have already spoken with on this and feels that it would be reasonable therapy to consider.  04/01/2018  Christine Butler returns today for follow-up of her dyslipidemia.  She has been in continual follow-up with her hepatologist at [facility].  Most recently in November she was considered to be placed on the transplant list.  She has undergone a number of tests including a stress echocardiogram which was negative.  In the interim she was started on Praluent, which she is taking and tolerating well.  I am pleased to report that she has had a significant reduction in LDL cholesterol.  Her most recent labs as of March 29, 2018 indicate total cholesterol of 279, HDL of 15, LDL 231 (decreased from 331) and triglycerides 165.  So far this is not associated with any change in her liver enzymes, however given her end-stage liver disease, is likely that the low or normal liver enzymes indicate lack of liver synthetic function.  Overall, this is likely the most safe medication for her to use in the interim with her dyslipidemia.  Again I suspect that liver transplant will  likely improve her dyslipidemia significantly.  PMHx:  Past Medical History:  Diagnosis Date  . Arthritis    right knee  . Ascites--mild this admit 11/06/2017  . CAD (coronary artery disease)    a. s/p CABGx2 in 02/2017 (LAD not suitable for PCI), EF normal.  . Familial hyperlipidemia   . GERD (gastroesophageal reflux disease)   . IBS (irritable bowel syndrome)   . PONV (postoperative nausea and  vomiting)   . Primary biliary cirrhosis ([place])   . S/P CABG (coronary artery bypass graft)   . SVD (spontaneous vaginal delivery)    x 3  . Urinary tract bacterial infections     Past Surgical History:  Procedure Laterality Date  . CORONARY ARTERY BYPASS GRAFT N/A 03/24/2017   Procedure: CORONARY ARTERY BYPASS GRAFTING (CABG) x two, using left internal mammary artery, left radial artery, and right leg greater saphenous vein harvested endoscopically;  Surgeon: Ivin Poot, MD;  Location: [place];  Service: Open Heart Surgery;  Laterality: N/A;  . ENDOVEIN HARVEST OF GREATER SAPHENOUS VEIN Right 03/24/2017   Procedure: ENDOVEIN HARVEST OF GREATER SAPHENOUS VEIN;  Surgeon: Ivin Poot, MD;  Location: [place];  Service: Open Heart Surgery;  Laterality: Right;  . ESOPHAGEAL BANDING  02/01/2018   Procedure: ESOPHAGEAL BANDING;  Surgeon: [name], MD;  Location: [facility];  Service: Gastroenterology;;  . ESOPHAGEAL BANDING N/A 03/29/2018   Procedure: ESOPHAGEAL BANDING;  Surgeon: [name], MD;  Location: [facility];  Service: Gastroenterology;  Laterality: N/A;  . ESOPHAGOGASTRODUODENOSCOPY N/A 03/29/2018   Procedure: ESOPHAGOGASTRODUODENOSCOPY (EGD);  Surgeon: [name], MD;  Location: [facility];  Service: Gastroenterology;  Laterality: N/A;  . ESOPHAGOGASTRODUODENOSCOPY (EGD) WITH PROPOFOL N/A 02/01/2018   Procedure: ESOPHAGOGASTRODUODENOSCOPY (EGD) WITH PROPOFOL;  Surgeon: [name], MD;  Location: [facility];  Service: Gastroenterology;  Laterality: N/A;  . INCONTINENCE SURGERY    . LEEP N/A 03/28/2014   Procedure: LOOP ELECTROSURGICAL EXCISION PROCEDURE (LEEP) cone biopsy;  Surgeon: Cyril Mourning, MD;  Location: [place] [facility];  Service: Gynecology;  Laterality: N/A;  . LEFT HEART CATH AND CORONARY ANGIOGRAPHY N/A 03/23/2017   Procedure: LEFT HEART CATH AND CORONARY ANGIOGRAPHY;  Surgeon: Jettie Booze, MD;  Location: [place] [facility];  Service: Cardiovascular;  Laterality:  N/A;  . LIVER BIOPSY     x 2  . RADIAL ARTERY HARVEST Left 03/24/2017   Procedure: RADIAL ARTERY HARVEST;  Surgeon: Ivin Poot, MD;  Location: [place];  Service: Open Heart Surgery;  Laterality: Left;  . TEE WITHOUT CARDIOVERSION N/A 03/24/2017   Procedure: TRANSESOPHAGEAL ECHOCARDIOGRAM (TEE);  Surgeon: Prescott Gum, Collier Salina, MD;  Location: [place];  Service: Open Heart Surgery;  Laterality: N/A;  . WISDOM TOOTH EXTRACTION      FAMHx:  Family History  Problem Relation Age of Onset  . CAD Father   . Diabetes Mellitus II Father   . Heart disease Father   . Heart attack Brother   . Heart disease Maternal Aunt   . Heart attack Paternal Grandmother   . Heart attack Paternal Grandfather     SOCHx:   reports that she has never smoked. She has never used smokeless tobacco. She reports current alcohol use of about 1.0 standard drinks of alcohol per week. She reports that she does not use drugs.  ALLERGIES:  Allergies  Allergen Reactions  . Codeine Nausea And Vomiting  . Erythromycin Nausea And Vomiting  . Fentanyl Nausea And Vomiting    ROS: Pertinent items noted in HPI  and remainder of comprehensive ROS otherwise negative.  HOME MEDS: Current Outpatient Medications on File Prior to Visit  Medication Sig Dispense Refill  . Alirocumab (PRALUENT) 150 MG/ML SOAJ Inject 1 Dose into the skin every 14 (fourteen) days. 2 pen 11  . calcium carbonate (TUMS - DOSED IN MG ELEMENTAL CALCIUM) 500 MG chewable tablet Chew 2 tablets by mouth daily.    . furosemide (LASIX) 20 MG tablet Take 1 tablet (20 mg total) by mouth daily. 30 tablet 6  . hydrOXYzine (ATARAX/VISTARIL) 25 MG tablet Take 25 mg by mouth every 6 (six) hours as needed for itching.     . imiquimod (ALDARA) 5 % cream Apply 1 application topically 3 (three) times a week.    . Melatonin 3 MG CAPS Take 6 mg by mouth at bedtime as needed (for sleep).    . metoprolol tartrate (LOPRESSOR) 25 MG tablet Take 0.5 tablets (12.5 mg total) by mouth  2 (two) times daily. 90 tablet 1  . metroNIDAZOLE (METROCREAM) 0.75 % cream Apply 1 application topically 2 (two) times daily.    . Multiple Vitamins-Minerals (DEKAS PLUS PO) Take 1 capsule by mouth daily.    . nitroGLYCERIN (NITROSTAT) 0.4 MG SL tablet Place 1 tablet (0.4 mg total) under the tongue every 5 (five) minutes as needed for chest pain. 25 tablet 6  . Probiotic Product (PROBIOTIC PO) Take 1 capsule by mouth daily.     Marland Kitchen spironolactone (ALDACTONE) 50 MG tablet Take 50 mg by mouth daily.   1  . triamcinolone cream (KENALOG) 0.1 % Apply 1 application topically 2 (two) times daily.    . ursodiol (ACTIGALL) 300 MG capsule Take 300 mg by mouth 3 (three) times daily.      No current facility-administered medications on file prior to visit.     LABS/IMAGING: No results found for this or any previous visit (from the past 48 hour(s)). No results found.  LIPID PANEL:    Component Value Date/Time   CHOL 279 (H) 03/29/2018 1119   TRIG 165 (H) 03/29/2018 1119   HDL 15 (L) 03/29/2018 1119   CHOLHDL 18.6 (H) 03/29/2018 1119   CHOLHDL NOT CALCULATED 12/18/2017 0720   VLDL 38 12/18/2017 0720   LDLCALC 231 (H) 03/29/2018 1119   LDLDIRECT 229 (H) 12/30/2017 0856    WEIGHTS: Wt Readings from Last 3 Encounters:  04/01/18 137 lb 3.2 oz (62.2 kg)  03/29/18 137 lb (62.1 kg)  03/10/18 136 lb (61.7 kg)    VITALS: BP 90/62   Pulse 65   Ht 5' 1.5" (1.562 m)   Wt 137 lb 3.2 oz (62.2 kg)   BMI 25.50 kg/m   EXAM: General appearance: alert, icteric and no distress Neck: no carotid bruit, no JVD and thyroid not enlarged, symmetric, no tenderness/mass/nodules Lungs: clear to auscultation bilaterally Heart: regular rate and rhythm Abdomen: soft, non-tender; bowel sounds normal; no masses,  no organomegaly Extremities: extremities normal, atraumatic, no cyanosis or edema Pulses: 2+ and symmetric Skin: jaundiced Neurologic: Grossly normal Psych:  Pleasant  EKG: Deferred  ASSESSMENT: 1. 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 ASCVD with recent two-vessel CABG (02/2017) 3. Strong family history of premature coronary artery disease 4. PBC, with cirrhosis and ongoing jaundice (followed by Dr. [name] at [facility])  PLAN: 1.   Ms. Lundy has had significant reduction in cholesterol on Praluent and seems to be tolerating the medication.  There is likely little also to offer at this point with regards to risk reduction.  I  am hesitant to add a statin given her failing liver and likely her best bet is liver transplant.  Currently her meld score is apparently not high enough for her to be a transplant candidate.  We will continue to follow this.  Plan a repeat lipid profile in 6 months and follow-up with me thereafter.  Pixie Casino, MD, [facility], [place] Director of the Advanced Lipid Disorders &  Cardiovascular Risk Reduction Clinic Diplomate of the American Board of Clinical Lipidology Attending Cardiologist  Direct Dial: [phone]  Fax: [phone]  Website:  [email or web address] 04/01/2018, 4:21 PM

## 2018-04-08 DIAGNOSIS — Z23 Encounter for immunization: Secondary | ICD-10-CM | POA: Diagnosis not present

## 2018-04-19 ENCOUNTER — Other Ambulatory Visit: Payer: Self-pay | Admitting: Gastroenterology

## 2018-04-19 DIAGNOSIS — I8500 Esophageal varices without bleeding: Secondary | ICD-10-CM | POA: Diagnosis not present

## 2018-04-19 DIAGNOSIS — K743 Primary biliary cirrhosis: Secondary | ICD-10-CM | POA: Diagnosis not present

## 2018-04-20 DIAGNOSIS — L570 Actinic keratosis: Secondary | ICD-10-CM | POA: Diagnosis not present

## 2018-04-20 DIAGNOSIS — A630 Anogenital (venereal) warts: Secondary | ICD-10-CM | POA: Diagnosis not present

## 2018-04-20 DIAGNOSIS — Z23 Encounter for immunization: Secondary | ICD-10-CM | POA: Diagnosis not present

## 2018-04-20 DIAGNOSIS — B078 Other viral warts: Secondary | ICD-10-CM | POA: Diagnosis not present

## 2018-04-26 DIAGNOSIS — C44311 Basal cell carcinoma of skin of nose: Secondary | ICD-10-CM | POA: Diagnosis not present

## 2018-04-29 DIAGNOSIS — K7290 Hepatic failure, unspecified without coma: Secondary | ICD-10-CM | POA: Diagnosis not present

## 2018-05-03 MED FILL — LACTULOSE 10 GM/15 ML SOLUT: 10 | 90 days supply | Qty: 1350 | Fill #0

## 2018-05-03 MED FILL — URSODIOL 300 MG CAPS: 300 | 18 days supply | Qty: 90 | Fill #1

## 2018-05-03 MED FILL — hydrOXYzine HCL 25 MG TABS: 25 | 90 days supply | Qty: 360 | Fill #1

## 2018-05-03 MED FILL — PRALUENT 150 MG/ML PEN: 150 | 28 days supply | Qty: 2 | Fill #4

## 2018-05-03 MED FILL — METOPROLOL TARTRATE 25 MG T: 25 | 90 days supply | Qty: 90 | Fill #2

## 2018-05-14 DIAGNOSIS — K743 Primary biliary cirrhosis: Secondary | ICD-10-CM | POA: Diagnosis not present

## 2018-05-14 DIAGNOSIS — R195 Other fecal abnormalities: Secondary | ICD-10-CM | POA: Diagnosis not present

## 2018-05-14 DIAGNOSIS — R945 Abnormal results of liver function studies: Secondary | ICD-10-CM | POA: Diagnosis not present

## 2018-05-17 ENCOUNTER — Encounter (HOSPITAL_COMMUNITY): Admission: RE | Payer: Self-pay | Source: Home / Self Care

## 2018-05-17 ENCOUNTER — Ambulatory Visit (HOSPITAL_COMMUNITY): Admission: RE | Admit: 2018-05-17 | Payer: 59 | Source: Home / Self Care | Admitting: Gastroenterology

## 2018-05-17 DIAGNOSIS — K7290 Hepatic failure, unspecified without coma: Principal | ICD-10-CM

## 2018-05-17 SURGERY — EGD (ESOPHAGOGASTRODUODENOSCOPY)
Anesthesia: Monitor Anesthesia Care

## 2018-05-18 DIAGNOSIS — A630 Anogenital (venereal) warts: Secondary | ICD-10-CM | POA: Diagnosis not present

## 2018-05-18 DIAGNOSIS — D485 Neoplasm of uncertain behavior of skin: Secondary | ICD-10-CM | POA: Diagnosis not present

## 2018-05-19 ENCOUNTER — Other Ambulatory Visit: Payer: Self-pay | Admitting: Gastroenterology

## 2018-05-19 MED FILL — PROCTOFOAM-HC 1%-1% FOAM: 1-1 | 30 days supply | Qty: 10 | Fill #0

## 2018-05-19 MED FILL — IMIQUIMOD 5 % CREA: 5 | 30 days supply | Qty: 24 | Fill #0

## 2018-05-21 ENCOUNTER — Ambulatory Visit (HOSPITAL_COMMUNITY): Payer: 59 | Admitting: Anesthesiology

## 2018-05-21 ENCOUNTER — Ambulatory Visit (HOSPITAL_COMMUNITY)
Admission: RE | Admit: 2018-05-21 | Discharge: 2018-05-21 | Disposition: A | Discharge status: Home / Self Care | Payer: 59 | Attending: Gastroenterology | Admitting: Gastroenterology

## 2018-05-21 ENCOUNTER — Other Ambulatory Visit: Payer: Self-pay

## 2018-05-21 ENCOUNTER — Encounter (HOSPITAL_COMMUNITY): Payer: Self-pay | Admitting: Emergency Medicine

## 2018-05-21 ENCOUNTER — Encounter (HOSPITAL_COMMUNITY)
Admission: RE | Disposition: A | Discharge status: Home / Self Care | Payer: Self-pay | Source: Home / Self Care | Attending: Gastroenterology

## 2018-05-21 DIAGNOSIS — M1711 Unilateral primary osteoarthritis, right knee: Secondary | ICD-10-CM | POA: Insufficient documentation

## 2018-05-21 DIAGNOSIS — K766 Portal hypertension: Secondary | ICD-10-CM | POA: Insufficient documentation

## 2018-05-21 DIAGNOSIS — Z881 Allergy status to other antibiotic agents status: Secondary | ICD-10-CM | POA: Insufficient documentation

## 2018-05-21 DIAGNOSIS — I2510 Atherosclerotic heart disease of native coronary artery without angina pectoris: Secondary | ICD-10-CM | POA: Insufficient documentation

## 2018-05-21 DIAGNOSIS — K219 Gastro-esophageal reflux disease without esophagitis: Secondary | ICD-10-CM | POA: Diagnosis not present

## 2018-05-21 DIAGNOSIS — E7849 Other hyperlipidemia: Secondary | ICD-10-CM | POA: Diagnosis not present

## 2018-05-21 DIAGNOSIS — Z7982 Long term (current) use of aspirin: Secondary | ICD-10-CM | POA: Diagnosis not present

## 2018-05-21 DIAGNOSIS — Z8249 Family history of ischemic heart disease and other diseases of the circulatory system: Secondary | ICD-10-CM | POA: Insufficient documentation

## 2018-05-21 DIAGNOSIS — Z951 Presence of aortocoronary bypass graft: Secondary | ICD-10-CM | POA: Insufficient documentation

## 2018-05-21 DIAGNOSIS — I8510 Secondary esophageal varices without bleeding: Secondary | ICD-10-CM | POA: Insufficient documentation

## 2018-05-21 DIAGNOSIS — Z888 Allergy status to other drugs, medicaments and biological substances status: Secondary | ICD-10-CM | POA: Insufficient documentation

## 2018-05-21 DIAGNOSIS — K589 Irritable bowel syndrome without diarrhea: Secondary | ICD-10-CM | POA: Insufficient documentation

## 2018-05-21 DIAGNOSIS — Z87440 Personal history of urinary (tract) infections: Secondary | ICD-10-CM | POA: Insufficient documentation

## 2018-05-21 DIAGNOSIS — Z833 Family history of diabetes mellitus: Secondary | ICD-10-CM | POA: Insufficient documentation

## 2018-05-21 DIAGNOSIS — Z79899 Other long term (current) drug therapy: Secondary | ICD-10-CM | POA: Insufficient documentation

## 2018-05-21 DIAGNOSIS — Z885 Allergy status to narcotic agent status: Secondary | ICD-10-CM | POA: Insufficient documentation

## 2018-05-21 DIAGNOSIS — I8500 Esophageal varices without bleeding: Secondary | ICD-10-CM | POA: Diagnosis not present

## 2018-05-21 DIAGNOSIS — D649 Anemia, unspecified: Secondary | ICD-10-CM | POA: Diagnosis not present

## 2018-05-21 DIAGNOSIS — K3189 Other diseases of stomach and duodenum: Secondary | ICD-10-CM | POA: Diagnosis not present

## 2018-05-21 DIAGNOSIS — K7469 Other cirrhosis of liver: Secondary | ICD-10-CM | POA: Diagnosis not present

## 2018-05-21 HISTORY — PX: ESOPHAGOGASTRODUODENOSCOPY: SHX5428

## 2018-05-21 HISTORY — PX: ESOPHAGEAL BANDING: SHX5518

## 2018-05-21 LAB — PREGNANCY, URINE: Preg Test, Ur: NEGATIVE

## 2018-05-21 SURGERY — EGD (ESOPHAGOGASTRODUODENOSCOPY)
Anesthesia: Monitor Anesthesia Care

## 2018-05-21 MED ORDER — HYDROMORPHONE HCL 1 MG/ML IJ SOLN
INTRAMUSCULAR | Status: DC | PRN
  Administered 2018-05-21 (×2): 0.5 mg via INTRAVENOUS

## 2018-05-21 MED ORDER — PROPOFOL 10 MG/ML IV BOLUS
INTRAVENOUS | Status: AC
  Filled 2018-05-21: qty 20

## 2018-05-21 MED ORDER — SODIUM CHLORIDE 0.9 % IV SOLN: INTRAVENOUS | Status: DC

## 2018-05-21 MED ORDER — LACTATED RINGERS IV SOLN
INTRAVENOUS | Status: DC
  Administered 2018-05-21: 13:00:00 via INTRAVENOUS

## 2018-05-21 MED ORDER — PROPOFOL 10 MG/ML IV BOLUS
INTRAVENOUS | Status: DC | PRN
  Administered 2018-05-21 (×4): 40 mg via INTRAVENOUS

## 2018-05-21 MED ORDER — ONDANSETRON HCL 4 MG/2ML IJ SOLN
INTRAMUSCULAR | Status: DC | PRN
  Administered 2018-05-21: 4 mg via INTRAVENOUS

## 2018-05-21 MED ORDER — PROPOFOL 500 MG/50ML IV EMUL
INTRAVENOUS | Status: DC | PRN
  Administered 2018-05-21: 75 ug/kg/min via INTRAVENOUS

## 2018-05-21 MED ORDER — HYDROMORPHONE HCL 1 MG/ML IJ SOLN
INTRAMUSCULAR | Status: AC
  Filled 2018-05-21: qty 1

## 2018-05-21 MED ORDER — SUCRALFATE 1 GM/10ML PO SUSP: 1.0000 g | Freq: Four times a day (QID) | ORAL | 1 refills | Status: DC

## 2018-05-21 MED ORDER — LIDOCAINE 2% (20 MG/ML) 5 ML SYRINGE
INTRAMUSCULAR | Status: DC | PRN
  Administered 2018-05-21: 50 mg via INTRAVENOUS

## 2018-05-21 MED FILL — SUCRALFATE 1 GM/10ML SUSP: 1 | 11 days supply | Qty: 420 | Fill #0

## 2018-05-21 NOTE — H&P (Signed)
Christine Butler is an 48 y.o. female.    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 She is currently on the list for transplant at [facility].  Past Medical History:  Diagnosis Date  . Arthritis    right knee  . Ascites--mild this admit 11/06/2017  . CAD (coronary artery disease)    a. s/p CABGx2 in 02/2017 (LAD not suitable for PCI), EF normal.  . Familial hyperlipidemia   . GERD (gastroesophageal reflux disease)   . IBS (irritable bowel syndrome)   . PONV (postoperative nausea and vomiting)   . Primary biliary cirrhosis ([place])   . S/P CABG (coronary artery bypass graft)   . SVD (spontaneous vaginal delivery)    x 3  . Urinary tract bacterial infections     Past Surgical History:  Procedure Laterality Date  . CORONARY ARTERY BYPASS GRAFT N/A 03/24/2017   Procedure: CORONARY ARTERY BYPASS GRAFTING (CABG) x two, using left internal mammary artery, left radial artery, and right leg greater saphenous vein harvested endoscopically;  Surgeon: Ivin Poot, MD;  Location: [place];  Service: Open Heart Surgery;  Laterality: N/A;  . ENDOVEIN HARVEST OF GREATER SAPHENOUS VEIN Right 03/24/2017   Procedure: ENDOVEIN HARVEST OF GREATER SAPHENOUS VEIN;  Surgeon: Ivin Poot, MD;  Location: [place];  Service: Open Heart Surgery;  Laterality: Right;  . ESOPHAGEAL BANDING  02/01/2018   Procedure: ESOPHAGEAL BANDING;  Surgeon: [name], MD;  Location: [facility];  Service: Gastroenterology;;  . ESOPHAGEAL BANDING N/A 03/29/2018   Procedure: ESOPHAGEAL BANDING;  Surgeon: [name], MD;  Location: [facility];  Service: Gastroenterology;  Laterality: N/A;  . ESOPHAGOGASTRODUODENOSCOPY N/A 03/29/2018   Procedure: ESOPHAGOGASTRODUODENOSCOPY (EGD);  Surgeon: [name], MD;  Location: [facility];  Service:  Gastroenterology;  Laterality: N/A;  . ESOPHAGOGASTRODUODENOSCOPY (EGD) WITH PROPOFOL N/A 02/01/2018   Procedure: ESOPHAGOGASTRODUODENOSCOPY (EGD) WITH PROPOFOL;  Surgeon: [name], MD;  Location: [facility];  Service: Gastroenterology;  Laterality: N/A;  . INCONTINENCE SURGERY    . LEEP N/A 03/28/2014   Procedure: LOOP ELECTROSURGICAL EXCISION PROCEDURE (LEEP) cone biopsy;  Surgeon: Cyril Mourning, MD;  Location: [place] [facility];  Service: Gynecology;  Laterality: N/A;  . LEFT HEART CATH AND CORONARY ANGIOGRAPHY N/A 03/23/2017   Procedure: LEFT HEART CATH AND CORONARY ANGIOGRAPHY;  Surgeon: Jettie Booze, MD;  Location: [place] [facility];  Service: Cardiovascular;  Laterality: N/A;  . LIVER BIOPSY     x 2  . RADIAL ARTERY HARVEST Left 03/24/2017   Procedure: RADIAL ARTERY HARVEST;  Surgeon: Ivin Poot, MD;  Location: [place];  Service: Open Heart Surgery;  Laterality: Left;  . TEE WITHOUT CARDIOVERSION N/A 03/24/2017   Procedure: TRANSESOPHAGEAL ECHOCARDIOGRAM (TEE);  Surgeon: Prescott Gum, Collier Salina, MD;  Location: [place];  Service: Open Heart Surgery;  Laterality: N/A;  . WISDOM TOOTH EXTRACTION      Family History  Problem Relation Age of Onset  . CAD Father   . Diabetes Mellitus II Father   . Heart disease Father   . Heart attack Brother   . Heart disease Maternal Aunt   . Heart attack Paternal Grandmother   . Heart attack Paternal Grandfather    Social History:  reports that she has never smoked. She has never used smokeless tobacco. She reports current alcohol use of about 1.0 standard drinks of  alcohol per week. She reports that she does not use drugs.  Allergies:  Allergies  Allergen Reactions  . Codeine Nausea And Vomiting  . Erythromycin Nausea And Vomiting  . Fentanyl Nausea And Vomiting    Medications Prior to Admission  Medication Sig Dispense Refill  . Alirocumab (PRALUENT) 150 MG/ML SOAJ Inject 1 Dose into the skin every 14 (fourteen) days. 2 pen 11  . aspirin 81  MG EC tablet Take 81 mg by mouth daily. Swallow whole.    . calcium carbonate (TUMS - DOSED IN MG ELEMENTAL CALCIUM) 500 MG chewable tablet Chew 2 tablets by mouth daily.    . furosemide (LASIX) 20 MG tablet Take 1 tablet (20 mg total) by mouth daily. 30 tablet 6  . hydrOXYzine (ATARAX/VISTARIL) 25 MG tablet Take 25 mg by mouth at bedtime as needed for itching.     . imiquimod (ALDARA) 5 % cream Apply 1 application topically 3 (three) times a week.    . metoprolol tartrate (LOPRESSOR) 25 MG tablet Take 0.5 tablets (12.5 mg total) by mouth 2 (two) times daily. 90 tablet 1  . Multiple Vitamins-Minerals (DEKAS PLUS PO) Take 1 capsule by mouth daily.    . Probiotic Product (PROBIOTIC PO) Take 1 capsule by mouth daily.     Marland Kitchen spironolactone (ALDACTONE) 50 MG tablet Take 50 mg by mouth daily.   1  . triamcinolone cream (KENALOG) 0.1 % Apply 1 application topically 2 (two) times daily.    . ursodiol (ACTIGALL) 300 MG capsule Take 300 mg by mouth 3 (three) times daily.     . Melatonin 3 MG CAPS Take 6 mg by mouth at bedtime as needed (for sleep).    . metroNIDAZOLE (METROCREAM) 0.75 % cream Apply 1 application topically 2 (two) times daily.    . nitroGLYCERIN (NITROSTAT) 0.4 MG SL tablet Place 1 tablet (0.4 mg total) under the tongue every 5 (five) minutes as needed for chest pain. 25 tablet 6    Results for orders placed or performed during the hospital encounter of 05/21/18 (from the past 48 hour(s))  Pregnancy, urine     Status: None   Collection Time: 05/21/18 12:23 PM  Result Value Ref Range   Preg Test, Ur NEGATIVE NEGATIVE    Comment:        THE SENSITIVITY OF THIS METHODOLOGY IS >20 mIU/mL. Performed at [facility], [employer] [address], [place], [place] [address]    No results found.  Review of Systems  Constitutional: Negative.   HENT: Negative.   Eyes: Negative.   Respiratory: Negative.   Cardiovascular: Negative.   Gastrointestinal: Negative.   Genitourinary:  Negative.   Musculoskeletal: Positive for joint pain and myalgias.  Skin: Positive for itching. Negative for rash.  Neurological: Negative.   Psychiatric/Behavioral: Negative.     Blood pressure (!) 129/51, pulse 82, temperature 98.1 F (36.7 C), temperature source Oral, resp. rate 16, height 5' 1.5" (1.562 m), weight 63.5 kg, last menstrual period 05/10/2018, SpO2 100 %. Physical Exam  Constitutional: She is oriented to person, place, and time. She appears well-developed and well-nourished.  HENT:  Head: Normocephalic and atraumatic.  Eyes: Conjunctivae are normal.  Neck: Normal range of motion. Neck supple.  Cardiovascular: Normal rate and regular rhythm.  Respiratory: Effort normal and breath sounds normal.  GI: Soft.  Musculoskeletal: Normal range of motion.  Neurological: She is alert and oriented to person, place, and time.  Skin: Skin is warm.  Psychiatric: She has a normal mood  and affect.     Assessment/Plan Esophageal varices EGD With banding. The risks and the benefits of the procedure were discussed with the patient in details. She understands and verbalizes consent.  [name], MD 05/21/2018, 12:49 PM

## 2018-05-21 NOTE — Op Note (Addendum)
[facility] Patient Name: Christine Butler Procedure Date: 05/21/2018 MRN: [id number] Attending MD: [name] , MD Date of Birth: 07-25-1970 CSN: [id number] Age: 48 Admit Type: Outpatient Procedure:                Upper GI endoscopy Indications:              For therapy of esophageal varices Providers:                [name], MD, Cleda Daub, RN, Anne Fu CRNA, CRNA Referring MD:              Medicines:                Monitored Anesthesia Care Complications:            No immediate complications. Estimated blood loss:                            None. Estimated Blood Loss:     Estimated blood loss: none. Procedure:                Pre-Anesthesia Assessment:                           - Prior to the procedure, a History and Physical                            was performed, and patient medications and                            allergies were reviewed. The patient's tolerance of                            previous anesthesia was also reviewed. The risks                            and benefits of the procedure and the sedation                            options and risks were discussed with the patient.                            All questions were answered, and informed consent                            was obtained. Prior Anticoagulants: The patient has                            taken no previous anticoagulant or antiplatelet                            agents except for aspirin. ASA Grade Assessment: II                            -  A patient with mild systemic disease. After                            reviewing the risks and benefits, the patient was                            deemed in satisfactory condition to undergo the                            procedure.                           After obtaining informed consent, the endoscope was                            passed under direct vision. Throughout the      procedure, the patient's blood pressure, pulse, and                            oxygen saturations were monitored continuously. The                            GIF-H190 ([id number]) Olympus gastroscope was                            introduced through the mouth, and advanced to the                            second part of duodenum. The upper GI endoscopy was                            accomplished without difficulty. The patient                            tolerated the procedure well. Scope In: Scope Out: Findings:      Grade II varices were found in the lower third of the esophagus. They       were small in size. Three bands were successfully placed with complete       eradication, resulting in deflation of varices. There was no bleeding       during and at the end of the procedure.      The Z-line was regular and was found 35 cm from the incisors.      Moderate portal hypertensive gastropathy was found in the cardia, in the       gastric fundus and in the gastric body.      The cardia and gastric fundus were normal on retroflexion.      The examined duodenum was normal. Impression:               - Grade II esophageal varices. Completely                            eradicated. Banded.                           - Z-line regular,  35 cm from the incisors.                           - Portal hypertensive gastropathy.                           - Normal examined duodenum.                           - No specimens collected. Moderate Sedation:      Patient did not receive moderate sedation for this procedure, but       instead received monitored anesthesia care. Recommendation:           - Patient has a contact number available for                            emergencies. The signs and symptoms of potential                            delayed complications were discussed with the                            patient. Return to normal activities tomorrow.                            Written discharge  instructions were provided to the                            patient.                           - Resume regular diet.                           - Use sucralfate suspension 1 gram PO BID for 2                            weeks.                           - Repeat upper endoscopy in 6 months to evaluate                            the response to therapy. Procedure Code(s):        --- Professional ---                           [phone], Esophagogastroduodenoscopy, flexible,                            transoral; with band ligation of esophageal/gastric                            varices Diagnosis Code(s):        --- Professional ---  I85.00, Esophageal varices without bleeding                           K76.6, Portal hypertension                           K31.89, Other diseases of stomach and duodenum CPT copyright 2019 American Medical Association. All rights reserved. The codes documented in this report are preliminary and upon coder review may  be revised to meet current compliance requirements. [name], MD 05/21/2018 1:31:31 PM This report has been signed electronically. Number of Addenda: 0

## 2018-05-21 NOTE — Brief Op Note (Signed)
05/21/2018  1:31 PM  PATIENT:  Christine Butler  48 y.o. female  PRE-OPERATIVE DIAGNOSIS:  Esophageal varices  POST-OPERATIVE DIAGNOSIS:  * No post-op diagnosis entered *  PROCEDURE:  Procedure(s): ESOPHAGOGASTRODUODENOSCOPY (EGD) (N/A) ESOPHAGEAL BANDING (N/A)  SURGEON:  Surgeon(s) and Role:    [name], MD - Primary  PHYSICIAN ASSISTANT:   ASSISTANTS:Patricia Ford, RN, Charolette Child, Tech  ANESTHESIA:   MAC  EBL:  None  BLOOD ADMINISTERED:none  DRAINS: none   LOCAL MEDICATIONS USED:  NONE  SPECIMEN:  No Specimen  DISPOSITION OF SPECIMEN:  N/A  COUNTS:  YES  TOURNIQUET:  * No tourniquets in log *  DICTATION: .Dragon Dictation  PLAN OF CARE: Discharge to home after PACU  PATIENT DISPOSITION:  PACU - hemodynamically stable.   Delay start of Pharmacological VTE agent (>24hrs) due to surgical blood loss or risk of bleeding: no

## 2018-05-21 NOTE — Discharge Instructions (Signed)

## 2018-05-21 NOTE — Transfer of Care (Signed)
Immediate Anesthesia Transfer of Care Note  Patient: Christine Butler  Procedure(s) Performed: Procedure(s): ESOPHAGOGASTRODUODENOSCOPY (EGD) (N/A) ESOPHAGEAL BANDING (N/A)  Patient Location: PACU  Anesthesia Type:MAC  Level of Consciousness:  sedated, patient cooperative and responds to stimulation  Airway & Oxygen Therapy:Patient Spontanous Breathing and Patient connected to face mask oxgen  Post-op Assessment:  Report given to PACU RN and Post -op Vital signs reviewed and stable  Post vital signs:  Reviewed and stable  Last Vitals:  Vitals:   05/21/18 1154 05/21/18 1332  BP: (!) 129/51 113/64  Pulse: 82 88  Resp: 16 (!) 23  Temp: 36.7 C   SpO2: 471% 252%    Complications: No apparent anesthesia complications

## 2018-05-21 NOTE — Anesthesia Postprocedure Evaluation (Signed)
Anesthesia Post Note  Patient: Christine Butler  Procedure(s) Performed: ESOPHAGOGASTRODUODENOSCOPY (EGD) (N/A ) ESOPHAGEAL BANDING (N/A )     Patient location during evaluation: Endoscopy Anesthesia Type: MAC Pain management: pain level controlled Vital Signs Assessment: post-procedure vital signs reviewed and stable Respiratory status: spontaneous breathing Cardiovascular status: stable Postop Assessment: no apparent nausea or vomiting Anesthetic complications: no    Last Vitals:  Vitals:   05/21/18 1154 05/21/18 1332  BP: (!) 129/51 113/64  Pulse: 82 88  Resp: 16 (!) 23  Temp: 36.7 C 37 C  SpO2: 100% 100%    Last Pain:  Vitals:   05/21/18 1332  TempSrc: Oral  PainSc: 0-No pain                 Bayani Renteria

## 2018-05-21 NOTE — Anesthesia Preprocedure Evaluation (Signed)
Anesthesia Evaluation  Patient identified by MRN, date of birth, ID band Patient awake    History of Anesthesia Complications (+) PONV  Airway Mallampati: II  TM Distance: >3 FB     Dental   Pulmonary neg pulmonary ROS,    breath sounds clear to auscultation       Cardiovascular + CAD   Rhythm:Regular Rate:Normal     Neuro/Psych    GI/Hepatic GERD  ,History noted   Endo/Other    Renal/GU      Musculoskeletal  (+) Arthritis ,   Abdominal   Peds  Hematology  (+) anemia ,   Anesthesia Other Findings   Reproductive/Obstetrics                             Anesthesia Physical Anesthesia Plan  ASA: III  Anesthesia Plan: MAC   Post-op Pain Management:    Induction: Intravenous  PONV Risk Score and Plan: Treatment may vary due to age or medical condition  Airway Management Planned: Simple Face Mask  Additional Equipment:   Intra-op Plan:   Post-operative Plan:   Informed Consent: I have reviewed the patients History and Physical, chart, labs and discussed the procedure including the risks, benefits and alternatives for the proposed anesthesia with the patient or authorized representative who has indicated his/her understanding and acceptance.     Dental advisory given  Plan Discussed with: CRNA and Anesthesiologist  Anesthesia Plan Comments:         Anesthesia Quick Evaluation

## 2018-05-25 ENCOUNTER — Encounter (HOSPITAL_COMMUNITY): Payer: Self-pay | Admitting: Gastroenterology

## 2018-05-31 DIAGNOSIS — Z23 Encounter for immunization: Secondary | ICD-10-CM | POA: Diagnosis not present

## 2018-06-01 MED FILL — AMOXICILLIN 500 MG CAPSULE: 500 | 7 days supply | Qty: 21 | Fill #0

## 2018-06-09 DIAGNOSIS — K743 Primary biliary cirrhosis: Secondary | ICD-10-CM | POA: Diagnosis not present

## 2018-06-09 MED FILL — URSODIOL 300 MG CAPS: 300 | 90 days supply | Qty: 450 | Fill #0

## 2018-06-18 DIAGNOSIS — K7290 Hepatic failure, unspecified without coma: Secondary | ICD-10-CM | POA: Diagnosis not present

## 2018-06-24 DIAGNOSIS — L570 Actinic keratosis: Secondary | ICD-10-CM | POA: Diagnosis not present

## 2018-06-24 DIAGNOSIS — B078 Other viral warts: Secondary | ICD-10-CM | POA: Diagnosis not present

## 2018-06-24 DIAGNOSIS — A630 Anogenital (venereal) warts: Secondary | ICD-10-CM | POA: Diagnosis not present

## 2018-06-28 MED FILL — PRALUENT 150 MG/ML PEN: 150 | 28 days supply | Qty: 2 | Fill #5

## 2018-06-28 MED FILL — SPIRONOLACTONE 50 MG TABS: 50 | 90 days supply | Qty: 90 | Fill #2

## 2018-06-30 MED FILL — FUROSEMIDE 20 MG TABS: 20 | 90 days supply | Qty: 90 | Fill #0

## 2018-06-30 MED FILL — HYDROCORTISONE ACETATE 25 M: 25 | 30 days supply | Qty: 60 | Fill #0

## 2018-07-03 ENCOUNTER — Telehealth: Payer: 59 | Admitting: Physician Assistant

## 2018-07-03 DIAGNOSIS — R5383 Other fatigue: Secondary | ICD-10-CM

## 2018-07-03 DIAGNOSIS — J029 Acute pharyngitis, unspecified: Secondary | ICD-10-CM

## 2018-07-03 NOTE — Progress Notes (Signed)
E-Visit for Corona Virus Screening  Hi Ms. Christine Butler.  Based on your current symptoms, you may very well have the virus, however your symptoms are mild. Currently, not all patients are being tested. If the symptoms are mild and there is not a known exposure, performing the test is not indicated. Should you develop worsening shortness of breath, chest pain, please go to the emergency department for further care.   You have been enrolled in [facility] for COVID-19. Daily you will receive a questionnaire within the [facility] website. Our COVID-19 response team will be monitoring your responses daily.   Coronavirus disease 2019 (COVID-19)is a respiratory illness that can spread from person to person. The virus that causes COVID-19 is a new virus that was first identified in the country of [place] but is now found in multiple other countries and has spread to the [place].  Symptoms associated with the virus are mild to severe fever, cough, and shortness of breath. There is currently no vaccine to protect against COVID-19, and there is no specific antiviral treatment for the virus.  It is vitally important that if you feel that you have an infection such as this virus or any other virus that you stay home and away from places where you may spread it to others.  You should self-quarantine for 14 days if you have symptoms that could potentially be coronavirus and avoid contact with people age 72 and older.   You may also take acetaminophen (Tylenol) as needed for fever.   Reduce your risk of any infection by using the same precautions used for avoiding the common cold or flu: Wash your hands often with soap and warm water for at least 20 seconds.  If soap and water are not readily available, use an alcohol-based hand sanitizer with at least 60% alcohol.  If coughing or sneezing, cover your mouth and nose by coughing or sneezing into the elbow areas of your shirt or coat, into a tissue or into  your sleeve (not your hands). Avoid shaking hands with others and consider head nods or verbal greetings only.  Avoid touching your eyes,nose, or mouth with unwashed hands. Avoid close contact with people who are sick. Avoid places or events with large numbers of people in one location, like concerts or sporting events. Carefully consider travel plans you have or are making. If you are planning any travel outside or inside the [place], visit the CDC'sTravelers' Health webpagefor the latest health notices. If you have some symptoms but not all symptoms, continue to monitor at home and seek medical attention if your symptoms worsen. If you are having a medical emergency, call 911.  HOME CARE Only take medications as instructed by your medical team. Drink plenty of fluids and get plenty of rest. A steam or ultrasonic humidifier can help if you have congestion.   GET HELP RIGHT AWAY IF: You develop worsening fever. You become short of breath You cough up blood. Your symptoms become more severe MAKE SURE YOU  Understand these instructions. Will watch your condition. Will get help right away if you are not doing well or get worse.  Your e-visit answers were reviewed by a board certified advanced clinical practitioner to complete your personal care plan.  Depending on the condition, your plan could have included both over the counter or prescription medications.  If there is a problem please reply once you have received a response from your provider. Your safety is important to [place].  If you  have drug allergies check your prescription carefully.    You can use MyChart to ask questions about today's visit, request a non-urgent call back, or ask for a work or school excuse for 24 hours related to this e-Visit. If it has been greater than 24 hours you will need to follow up with your provider, or enter a new e-Visit to address those concerns. You will get an e-mail in the next two days asking about  your experience.  I hope that your e-visit has been valuable and will speed your recovery. Thank you for using e-visits.    ===View-only below this line===   ----- Message -----    From: Christine Butler    Sent: 07/03/2018  2:45 PM EDT      To: E-Visit Mailing List Subject: E-Visit Submission: CoronaVirus (COVID-19) Screening  E-Visit Submission: CoronaVirus (HERDE-08) Screening --------------------------------  Question: Do you have any of the following?  Answer:   Shortness of breath  Question: If you are experiencing trouble breathing please select the severity of this:  Answer:   I have mild trouble breathing but not very often  Question: Do you have any of the following additional symptoms?  Answer:   Sore throat            Body aches            None of these  Question: Have you had a fever? Answer:   No  Question: Have others in your home or workplace had similar symptoms? Answer:   No  Question: When did your symptoms start? Answer:   06/30/2018  Question: Have you recently visited any of the following countries? Answer:   None of these  Question: If you have traveled anywhere in the last  2 months please document where you have visited: Answer:     Question: Have you recently been around others from these countries or visited these countries who have had coughing or fever? Answer:   No  Question: Have you recently been around anyone who has been diagnosed with Corona virus? Answer:   No  Question: Have you been taking any medications? Answer:   Yes  Question: If taking medications for these symptoms, please list the names and whether they are helping or not Answer:   Iburprofen, helps with mild aches  Question: Are you treated for any of the following conditions: Asthma, COPD, Diabetes, Renal Failure (on Dialysis), AIDS, any Neuromuscular disease that effects the clearing of secretions, Heart Failure, or Heart Disease? Answer:   Yes  Question: Please enter  a phone number where you can be reached if we have additional questions about your symptoms Answer:   [phone]  Question: Please list your medication allergies that you may have ? (If 'none' , please list as 'none') Answer:   Codeine, E-mycin, fentanyl  Question: Please list any additional comments  Answer:   I woke up wed morning with upper abd pain , nausea, body aches, lethargy . The abd pain/nausea  has since subsided . I now have a sore throat that is red and have a heaviness in my chest wall when trying to take a deep breath. The body aches are better but ive been more lethargic than usual and want to sleep a lot.  Question: Are you pregnant? Answer:   I am confident that I am not pregnant  Question: Are you breastfeeding? Answer:   No  A total of 5-10 minutes was spent evaluating this patients questionnaire and formulating  a plan of care.

## 2018-07-12 DIAGNOSIS — K648 Other hemorrhoids: Secondary | ICD-10-CM | POA: Diagnosis not present

## 2018-07-12 DIAGNOSIS — K743 Primary biliary cirrhosis: Secondary | ICD-10-CM | POA: Diagnosis not present

## 2018-07-12 DIAGNOSIS — I8500 Esophageal varices without bleeding: Secondary | ICD-10-CM | POA: Diagnosis not present

## 2018-07-12 DIAGNOSIS — Z87898 Personal history of other specified conditions: Secondary | ICD-10-CM | POA: Diagnosis not present

## 2018-07-12 DIAGNOSIS — A630 Anogenital (venereal) warts: Secondary | ICD-10-CM | POA: Diagnosis not present

## 2018-07-26 DIAGNOSIS — I2510 Atherosclerotic heart disease of native coronary artery without angina pectoris: Secondary | ICD-10-CM | POA: Diagnosis not present

## 2018-07-26 DIAGNOSIS — K743 Primary biliary cirrhosis: Secondary | ICD-10-CM | POA: Diagnosis not present

## 2018-07-26 DIAGNOSIS — E785 Hyperlipidemia, unspecified: Secondary | ICD-10-CM | POA: Diagnosis not present

## 2018-07-27 DIAGNOSIS — A630 Anogenital (venereal) warts: Secondary | ICD-10-CM | POA: Diagnosis not present

## 2018-07-27 DIAGNOSIS — L570 Actinic keratosis: Secondary | ICD-10-CM | POA: Diagnosis not present

## 2018-07-27 MED FILL — IMIQUIMOD 5 % CREA: 5 | 56 days supply | Qty: 24 | Fill #0

## 2018-07-28 DIAGNOSIS — K7290 Hepatic failure, unspecified without coma: Secondary | ICD-10-CM | POA: Diagnosis not present

## 2018-08-05 DIAGNOSIS — N879 Dysplasia of cervix uteri, unspecified: Secondary | ICD-10-CM | POA: Diagnosis not present

## 2018-08-05 DIAGNOSIS — R87612 Low grade squamous intraepithelial lesion on cytologic smear of cervix (LGSIL): Secondary | ICD-10-CM | POA: Diagnosis not present

## 2018-08-05 DIAGNOSIS — R87610 Atypical squamous cells of undetermined significance on cytologic smear of cervix (ASC-US): Secondary | ICD-10-CM

## 2018-08-05 HISTORY — DX: Atypical squamous cells of undetermined significance on cytologic smear of cervix (ASC-US): R87.610

## 2018-08-09 DIAGNOSIS — E785 Hyperlipidemia, unspecified: Secondary | ICD-10-CM | POA: Diagnosis not present

## 2018-08-09 DIAGNOSIS — E7849 Other hyperlipidemia: Secondary | ICD-10-CM | POA: Diagnosis not present

## 2018-08-12 ENCOUNTER — Encounter: Payer: Self-pay | Admitting: Cardiology

## 2018-08-12 ENCOUNTER — Other Ambulatory Visit: Payer: Self-pay

## 2018-08-12 ENCOUNTER — Telehealth (INDEPENDENT_AMBULATORY_CARE_PROVIDER_SITE_OTHER): Payer: 59 | Admitting: Cardiology

## 2018-08-12 VITALS — BP 99/55 | HR 69 | Ht 61.5 in | Wt 145.0 lb

## 2018-08-12 DIAGNOSIS — R079 Chest pain, unspecified: Secondary | ICD-10-CM

## 2018-08-12 DIAGNOSIS — Z951 Presence of aortocoronary bypass graft: Secondary | ICD-10-CM

## 2018-08-12 DIAGNOSIS — I2510 Atherosclerotic heart disease of native coronary artery without angina pectoris: Secondary | ICD-10-CM

## 2018-08-12 DIAGNOSIS — K743 Primary biliary cirrhosis: Secondary | ICD-10-CM

## 2018-08-12 NOTE — Patient Instructions (Addendum)
Medication Instructions:  Your physician recommends that you continue on your current medications as directed. Please refer to the Current Medication list given to you today.  If you need a refill on your cardiac medications before your next appointment, please call your pharmacy.   Lab work: NONE If you have labs (blood work) drawn today and your tests are completely normal, you will receive your results only by: Marland Kitchen MyChart Message (if you have MyChart) OR . A paper copy in the mail If you have any lab test that is abnormal or we need to change your treatment, we will call you to review the results.  Testing/Procedures: Your physician has requested that you have a lexiscan myoview. For further information please visit [email or web address]. Please follow instruction sheet, as given.    [facility] [address] [place], [place] [address] Phone: [phone]  August 12, 2018    Christine Butler DOB: 03/15/1970 MRN: [id number] [address] [place] [address]   Dear Christine Butler,  You are scheduled for a Myocardial Perfusion Imaging Study on:  August 17, 2018 at 0730 AM.  Please arrive 15 minutes prior to your appointment time for registration and insurance purposes.  The test will take approximately 3 to 4 hours to complete; you may bring reading material.  If someone comes with you to your appointment, they will need to remain in the main lobby due to limited space in the testing area. **If you are pregnant or breastfeeding, please notify the nuclear lab prior to your appointment**  How to prepare for your Myocardial Perfusion Test: . Do not eat or drink 3 hours prior to your test, except you may have water. . Do not consume products containing caffeine (regular or decaffeinated) 12 hours prior to your test. (ex: coffee, chocolate, sodas, tea). Do bring a list of your current medications with you.  If not listed below, you  may take your medications as normal. . Do not take metoprolol (Lopressor, Toprol) for 24 hours prior to the test.  Bring the medication to your appointment as you may be required to take it once the test is complete. . Do wear comfortable clothes (no dresses or overalls) and walking shoes, tennis shoes preferred (No heels or open toe shoes are allowed). . Do NOT wear cologne, perfume, aftershave, or lotions (deodorant is allowed). . If these instructions are not followed, your test will have to be rescheduled.  Please report to [address] for your test.  If you have questions or concerns about your appointment, you can call the Nuclear Lab at [phone].  If you cannot keep your appointment, please provide 24 hours notification to the Nuclear Lab, to avoid a possible $50 charge to your account.   Follow-Up: At [facility], you and your health needs are our priority.  As part of our continuing mission to provide you with exceptional heart care, we have created designated Provider Care Teams.  These Care Teams include your primary Cardiologist (physician) and Advanced Practice Providers (APPs -  Physician Assistants and Nurse Practitioners) who all work together to provide you with the care you need, when you need it. You will need a follow up appointment in 6 months.     Any Other Special Instructions Will Be Listed Below  Regadenoson injection What is this medicine? REGADENOSON is used to test the heart for coronary artery disease. It is used in patients who can not exercise  for their stress test. This medicine may be used for other purposes; ask your health care provider or pharmacist if you have questions. COMMON BRAND NAME(S): Lexiscan What should I tell my health care provider before I take this medicine? They need to know if you have any of these conditions: -heart problems -lung or breathing disease, like asthma or COPD -an unusual or allergic reaction to  regadenoson, other medicines, foods, dyes, or preservatives -pregnant or trying to get pregnant -breast-feeding How should I use this medicine? This medicine is for injection into a vein. It is given by a health care professional in a hospital or clinic setting. Talk to your pediatrician regarding the use of this medicine in children. Special care may be needed. Overdosage: If you think you have taken too much of this medicine contact a poison control center or emergency room at once. NOTE: This medicine is only for you. Do not share this medicine with others. What if I miss a dose? This does not apply. What may interact with this medicine? -caffeine -dipyridamole -guarana -theophylline This list may not describe all possible interactions. Give your health care provider a list of all the medicines, herbs, non-prescription drugs, or dietary supplements you use. Also tell them if you smoke, drink alcohol, or use illegal drugs. Some items may interact with your medicine. What should I watch for while using this medicine? Your condition will be monitored carefully while you are receiving this medicine. Do not take medicines, foods, or drinks with caffeine (like coffee, tea, or colas) for at least 12 hours before your test. If you do not know if something contains caffeine, ask your health care professional. What side effects may I notice from receiving this medicine? Side effects that you should report to your doctor or health care professional as soon as possible: -allergic reactions like skin rash, itching or hives, swelling of the face, lips, or tongue -breathing problems -chest pain, tightness or palpitations -severe headache Side effects that usually do not require medical attention (report to your doctor or health care professional if they continue or are bothersome): -flushing -headache -irritation or pain at site where injected -nausea, vomiting This list may not describe all  possible side effects. Call your doctor for medical advice about side effects. You may report side effects to FDA at 1-800-FDA-1088. Where should I keep my medicine? This drug is given in a hospital or clinic and will not be stored at home. NOTE: This sheet is a summary. It may not cover all possible information. If you have questions about this medicine, talk to your doctor, pharmacist, or health care provider.  2019 Elsevier/Gold Standard (2007-10-11 15:08:13)   Cardiac Nuclear Scan A cardiac nuclear scan is a test that is done to check the flow of blood to your heart. It is done when you are resting and when you are exercising. The test looks for problems such as:  Not enough blood reaching a portion of the heart.  The heart muscle not working as it should. You may need this test if:  You have heart disease.  You have had lab results that are not normal.  You have had heart surgery or a balloon procedure to open up blocked arteries (angioplasty).  You have chest pain.  You have shortness of breath. In this test, a special dye (tracer) is put into your bloodstream. The tracer will travel to your heart. A camera will then take pictures of your heart to see how the tracer moves  through your heart. This test is usually done at a hospital and takes 2-4 hours. Tell a doctor about:  Any allergies you have.  All medicines you are taking, including vitamins, herbs, eye drops, creams, and over-the-counter medicines.  Any problems you or family members have had with anesthetic medicines.  Any blood disorders you have.  Any surgeries you have had.  Any medical conditions you have.  Whether you are pregnant or may be pregnant. What are the risks? Generally, this is a safe test. However, problems may occur, such as:  Serious chest pain and heart attack. This is only a risk if the stress portion of the test is done.  Rapid heartbeat.  A feeling of warmth in your chest. This  feeling usually does not last long.  Allergic reaction to the tracer. What happens before the test?  Ask your doctor about changing or stopping your normal medicines. This is important.  Follow instructions from your doctor about what you cannot eat or drink.  Remove your jewelry on the day of the test. What happens during the test?  An IV tube will be inserted into one of your veins.  Your doctor will give you a small amount of tracer through the IV tube.  You will wait for 20-40 minutes while the tracer moves through your bloodstream.  Your heart will be monitored with an electrocardiogram (ECG).  You will lie down on an exam table.  Pictures of your heart will be taken for about 15-20 minutes.  You may also have a stress test. For this test, one of these things may be done: ? You will be asked to exercise on a treadmill or a stationary bike. ? You will be given medicines that will make your heart work harder. This is done if you are unable to exercise.  When blood flow to your heart has peaked, a tracer will again be given through the IV tube.  After 20-40 minutes, you will get back on the exam table. More pictures will be taken of your heart.  Depending on the tracer that is used, more pictures may need to be taken 3-4 hours later.  Your IV tube will be removed when the test is over. The test may vary among doctors and hospitals. What happens after the test?  Ask your doctor: ? Whether you can return to your normal schedule, including diet, activities, and medicines. ? Whether you should drink more fluids. This will help to remove the tracer from your body. Drink enough fluid to keep your pee (urine) pale yellow.  Ask your doctor, or the department that is doing the test: ? When will my results be ready? ? How will I get my results? Summary  A cardiac nuclear scan is a test that is done to check the flow of blood to your heart.  Tell your doctor whether you are  pregnant or may be pregnant.  Before the test, ask your doctor about changing or stopping your normal medicines. This is important.  Ask your doctor whether you can return to your normal activities. You may be asked to drink more fluids. This information is not intended to replace advice given to you by your health care provider. Make sure you discuss any questions you have with your health care provider. Document Released: 07/27/2017 Document Revised: 07/27/2017 Document Reviewed: 07/27/2017 Elsevier Interactive Patient Education  2019 [employer].

## 2018-08-12 NOTE — Progress Notes (Signed)
Virtual Visit via Video Note   This visit type was conducted due to national recommendations for restrictions regarding the COVID-19 Pandemic (e.g. social distancing) in an effort to limit this patient's exposure and mitigate transmission in our community.  Due to her co-morbid illnesses, this patient is at least at moderate risk for complications without adequate follow up.  This format is felt to be most appropriate for this patient at this time.  All issues noted in this document were discussed and addressed.  A limited physical exam was performed with this format.  Please refer to the patient's chart for her consent to telehealth for [facility].   Date:  08/12/2018   ID:  [name], DOB 1970-03-15, MRN [id number]  Patient Location: Home Provider Location: Home  PCP:  Maude Leriche, PA-C  Cardiologist:  [name], MD  Electrophysiologist:  None   Evaluation Performed:  Follow-Up Visit  Chief Complaint: Chest tightness and chest discomfort  History of Present Illness:    Shanitra Phillippi Rake is a 48 y.o. female with past medical history of coronary artery disease post CABG surgery, patient has primary biliary cholangitis and is on liver transplant list.  She is a Chartered loss adjuster.  She mentions to me that occasionally she will have chest tightness with her exerting herself like maybe pushing a stretcher.  No orthopnea or PND.  She is concerned about the symptoms and wants to be evaluated.  At the time of my evaluation, the patient is alert awake oriented and in no distress.  The patient does not have symptoms concerning for COVID-19 infection (fever, chills, cough, or new shortness of breath).    Past Medical History:  Diagnosis Date   Arthritis    right knee   Ascites--mild this admit 11/06/2017   CAD (coronary artery disease)    a. s/p CABGx2 in 02/2017 (LAD not suitable for PCI), EF normal.   Familial hyperlipidemia    GERD (gastroesophageal reflux  disease)    IBS (irritable bowel syndrome)    PONV (postoperative nausea and vomiting)    Primary biliary cirrhosis (HCC)    S/P CABG (coronary artery bypass graft)    SVD (spontaneous vaginal delivery)    x 3   Urinary tract bacterial infections    Past Surgical History:  Procedure Laterality Date   CORONARY ARTERY BYPASS GRAFT N/A 03/24/2017   Procedure: CORONARY ARTERY BYPASS GRAFTING (CABG) x two, using left internal mammary artery, left radial artery, and right leg greater saphenous vein harvested endoscopically;  Surgeon: Ivin Poot, MD;  Location: [place];  Service: Open Heart Surgery;  Laterality: N/A;   ENDOVEIN HARVEST OF GREATER SAPHENOUS VEIN Right 03/24/2017   Procedure: ENDOVEIN HARVEST OF GREATER SAPHENOUS VEIN;  Surgeon: Ivin Poot, MD;  Location: [place];  Service: Open Heart Surgery;  Laterality: Right;   ESOPHAGEAL BANDING  02/01/2018   Procedure: ESOPHAGEAL BANDING;  Surgeon: [name], MD;  Location: [facility];  Service: Gastroenterology;;   ESOPHAGEAL BANDING N/A 03/29/2018   Procedure: ESOPHAGEAL BANDING;  Surgeon: [name], MD;  Location: [facility];  Service: Gastroenterology;  Laterality: N/A;   ESOPHAGEAL BANDING N/A 05/21/2018   Procedure: ESOPHAGEAL BANDING;  Surgeon: [name], MD;  Location: [facility];  Service: Gastroenterology;  Laterality: N/A;   ESOPHAGOGASTRODUODENOSCOPY N/A 03/29/2018   Procedure: ESOPHAGOGASTRODUODENOSCOPY (EGD);  Surgeon: [name], MD;  Location: [facility];  Service: Gastroenterology;  Laterality: N/A;   ESOPHAGOGASTRODUODENOSCOPY N/A 05/21/2018   Procedure: ESOPHAGOGASTRODUODENOSCOPY (EGD);  Surgeon:  [name], MD;  Location: [facility];  Service: Gastroenterology;  Laterality: N/A;   ESOPHAGOGASTRODUODENOSCOPY (EGD) WITH PROPOFOL N/A 02/01/2018   Procedure: ESOPHAGOGASTRODUODENOSCOPY (EGD) WITH PROPOFOL;  Surgeon: [name], MD;  [facility]: [facility];  Service: Gastroenterology;  Laterality:  N/A;   INCONTINENCE SURGERY     LEEP N/A 03/28/2014   Procedure: LOOP ELECTROSURGICAL EXCISION PROCEDURE (LEEP) cone biopsy;  Surgeon: Cyril Mourning, MD;  Location: [facility];  Service: Gynecology;  Laterality: N/A;   LEFT HEART CATH AND CORONARY ANGIOGRAPHY N/A 03/23/2017   Procedure: LEFT HEART CATH AND CORONARY ANGIOGRAPHY;  Surgeon: Jettie Booze, MD;  Location: [place] [facility];  Service: Cardiovascular;  Laterality: N/A;   LIVER BIOPSY     x 2   RADIAL ARTERY HARVEST Left 03/24/2017   Procedure: RADIAL ARTERY HARVEST;  Surgeon: Ivin Poot, MD;  Location: [place];  Service: Open Heart Surgery;  Laterality: Left;   TEE WITHOUT CARDIOVERSION N/A 03/24/2017   Procedure: TRANSESOPHAGEAL ECHOCARDIOGRAM (TEE);  Surgeon: Prescott Gum, Collier Salina, MD;  Location: [place];  Service: Open Heart Surgery;  Laterality: N/A;   WISDOM TOOTH EXTRACTION       Current Meds  Medication Sig   Alirocumab (PRALUENT) 150 MG/ML SOAJ Inject 1 Dose into the skin every 14 (fourteen) days.   aspirin 81 MG EC tablet Take 81 mg by mouth daily. Swallow whole.   calcium carbonate (TUMS - DOSED IN MG ELEMENTAL CALCIUM) 500 MG chewable tablet Chew 2 tablets by mouth daily.   furosemide (LASIX) 20 MG tablet Take 1 tablet (20 mg total) by mouth daily.   hydrOXYzine (ATARAX/VISTARIL) 25 MG tablet Take 25 mg by mouth at bedtime as needed for itching.    imiquimod (ALDARA) 5 % cream Apply 1 application topically 3 (three) times a week.   Melatonin 3 MG CAPS Take 6 mg by mouth at bedtime as needed (for sleep).   metoprolol tartrate (LOPRESSOR) 25 MG tablet Take 0.5 tablets (12.5 mg total) by mouth 2 (two) times daily.   metroNIDAZOLE (METROCREAM) 0.75 % cream Apply 1 application topically 2 (two) times daily.   Multiple Vitamins-Minerals (DEKAS PLUS PO) Take 1 capsule by mouth daily.   nitroGLYCERIN (NITROSTAT) 0.4 MG SL tablet Place 1 tablet (0.4 mg total) under the tongue every 5 (five) minutes as needed  for chest pain.   Probiotic Product (PROBIOTIC PO) Take 1 capsule by mouth daily.    spironolactone (ALDACTONE) 50 MG tablet Take 50 mg by mouth daily.    sucralfate (CARAFATE) 1 GM/10ML suspension Take 10 mLs (1 g total) by mouth 4 (four) times daily.   triamcinolone cream (KENALOG) 0.1 % Apply 1 application topically 2 (two) times daily.   ursodiol (ACTIGALL) 300 MG capsule Take 300 mg by mouth 3 (three) times daily.      Allergies:   Codeine, Erythromycin, and Fentanyl   Social History   Tobacco Use   Smoking status: Never Smoker   Smokeless tobacco: Never Used  Substance Use Topics   Alcohol use: Yes    Alcohol/week: 1.0 standard drinks    Types: 1 Glasses of wine per week    Comment: for dinner   Drug use: No     Family Hx: The patient's family history includes CAD in her father; Diabetes Mellitus II in her father; Heart attack in her brother, paternal grandfather, and paternal grandmother; Heart disease in her father and maternal aunt.  ROS:   Please see the history of present illness.  As mentioned above All other systems reviewed and are negative.   Prior CV studies:   The following studies were reviewed today:  Coronary angiography report was discussed and evaluated and reviewed and also echocardiogram that was done last  Labs/Other Tests and Data Reviewed:    EKG:  No ECG reviewed.  Recent Labs: 11/05/2017: B Natriuretic Peptide 306.0; Magnesium 1.8 02/01/2018: ALT 37 02/12/2018: BUN 9; Creatinine, Ser 0.54; Hemoglobin 11.3; Platelets 228; Potassium 4.3; Sodium 130   Recent Lipid Panel Lab Results  Component Value Date/Time   CHOL 279 (H) 03/29/2018 11:19 AM   TRIG 165 (H) 03/29/2018 11:19 AM   HDL 15 (L) 03/29/2018 11:19 AM   CHOLHDL 18.6 (H) 03/29/2018 11:19 AM   CHOLHDL NOT CALCULATED 12/18/2017 07:20 AM   LDLCALC 231 (H) 03/29/2018 11:19 AM   LDLDIRECT 229 (H) 12/30/2017 08:56 AM    Wt Readings from Last 3 Encounters:  08/12/18 145 lb  (65.8 kg)  05/21/18 140 lb (63.5 kg)  04/01/18 137 lb 3.2 oz (62.2 kg)     Objective:    Vital Signs:  BP (!) 99/55 (BP Location: Left Arm, Patient Position: Sitting, Cuff Size: Normal)    Pulse 69    Ht 5' 1.5" (1.562 m)    Wt 145 lb (65.8 kg)    BMI 26.95 kg/m    VITAL SIGNS:  reviewed  ASSESSMENT & PLAN:    1. Chest tightness: The patient has risk factors for coronary artery disease and has had bypass surgery.  Her symptoms are concerning.  We will make sure that she has nitroglycerin with her at all times and knows how to use it.  We will also set her up for a Lexiscan sestamibi to evaluate the symptoms. 2. Her lipids are followed by her primary care physician and she tells me that she is taking Praluent on a regular basis and I encouraged her to be compliant with diet. 3. Patient will be seen in follow-up appointment in 6 months or earlier if the patient has any concerns   COVID-19 Education: The signs and symptoms of COVID-19 were discussed with the patient and how to seek care for testing (follow up with PCP or arrange E-visit).  The importance of social distancing was discussed today.  Time:   Today, I have spent 15 minutes with the patient with telehealth technology discussing the above problems.     Medication Adjustments/Labs and Tests Ordered: Current medicines are reviewed at length with the patient today.  Concerns regarding medicines are outlined above.   Tests Ordered: No orders of the defined types were placed in this encounter.   Medication Changes: No orders of the defined types were placed in this encounter.   Follow Up:  Virtual Visit or In Person in 3 month(s)  Signed, [name], MD  08/12/2018 3:48 PM    [place]

## 2018-08-12 NOTE — Addendum Note (Signed)
Addended by: Beckey Rutter on: 08/12/2018 04:26 PM   Modules accepted: Orders

## 2018-08-13 ENCOUNTER — Telehealth (HOSPITAL_COMMUNITY): Payer: Self-pay | Admitting: *Deleted

## 2018-08-13 DIAGNOSIS — K7290 Hepatic failure, unspecified without coma: Secondary | ICD-10-CM | POA: Diagnosis not present

## 2018-08-13 NOTE — Telephone Encounter (Signed)
Attempted calling patient to go over Myoview instructions.  Patients mailbox is full so I could not leave any message as per DPR.

## 2018-08-13 NOTE — Telephone Encounter (Signed)
Left detailed instructions in reference to upcoming Myoview on email. Test to be done on 08/17/18 @ 7:30.

## 2018-08-17 ENCOUNTER — Other Ambulatory Visit: Payer: Self-pay

## 2018-08-17 ENCOUNTER — Ambulatory Visit (HOSPITAL_COMMUNITY): Payer: 59 | Attending: Cardiovascular Disease

## 2018-08-17 VITALS — Ht 61.5 in | Wt 145.0 lb

## 2018-08-17 DIAGNOSIS — R079 Chest pain, unspecified: Secondary | ICD-10-CM | POA: Insufficient documentation

## 2018-08-17 DIAGNOSIS — I2510 Atherosclerotic heart disease of native coronary artery without angina pectoris: Secondary | ICD-10-CM | POA: Diagnosis not present

## 2018-08-17 DIAGNOSIS — R0789 Other chest pain: Secondary | ICD-10-CM | POA: Diagnosis not present

## 2018-08-17 DIAGNOSIS — R110 Nausea: Secondary | ICD-10-CM | POA: Insufficient documentation

## 2018-08-17 MED ORDER — TECHNETIUM TC 99M TETROFOSMIN IV KIT
11.0000 | PACK | Freq: Once | INTRAVENOUS | Status: AC | PRN
  Administered 2018-08-17: 11 via INTRAVENOUS
  Filled 2018-08-17: qty 11

## 2018-08-17 MED ORDER — REGADENOSON 0.4 MG/5ML IV SOLN
0.4000 mg | Freq: Once | INTRAVENOUS | Status: AC
  Administered 2018-08-17: 0.4 mg via INTRAVENOUS

## 2018-08-17 MED ORDER — TECHNETIUM TC 99M TETROFOSMIN IV KIT
32.4000 | PACK | Freq: Once | INTRAVENOUS | Status: AC | PRN
  Administered 2018-08-17: 32.4 via INTRAVENOUS
  Filled 2018-08-17: qty 33

## 2018-08-17 MED ORDER — AMINOPHYLLINE 25 MG/ML IV SOLN
75.0000 mg | Freq: Once | INTRAVENOUS | Status: AC
  Administered 2018-08-17: 75 mg via INTRAVENOUS

## 2018-08-18 LAB — MYOCARDIAL PERFUSION IMAGING
LV dias vol: 76 mL (ref 46–106)
LV sys vol: 30 mL
Peak HR: 98 {beats}/min
Rest HR: 76 {beats}/min
SDS: 4
SRS: 8
SSS: 13
TID: 1.02

## 2018-08-20 ENCOUNTER — Telehealth: Payer: Self-pay

## 2018-08-20 NOTE — Telephone Encounter (Signed)
Results relayed to patient, no further questions. Copy of results sent to Dr. Perlie Mayo per Dr. Geraldo Pitter request.

## 2018-08-20 NOTE — Telephone Encounter (Signed)
-----   Message from [name], MD sent at 08/19/2018  8:04 AM EDT ----- The results of the study is unremarkable. Please inform patient. I will discuss in detail at next appointment. Cc  primary care/referring physician [name], MD 08/19/2018 8:04 AM

## 2018-09-01 ENCOUNTER — Ambulatory Visit: Admit: 2018-09-01 | Discharge: 2018-09-02 | Payer: PRIVATE HEALTH INSURANCE

## 2018-09-01 DIAGNOSIS — K743 Primary biliary cirrhosis: Principal | ICD-10-CM

## 2018-09-01 MED ORDER — NALTREXONE 50 MG TABLET
ORAL_TABLET | Freq: Every day | ORAL | 11 refills | 0.00000 days
Start: 2018-09-01 — End: 2019-09-01

## 2018-09-06 DIAGNOSIS — D485 Neoplasm of uncertain behavior of skin: Secondary | ICD-10-CM | POA: Diagnosis not present

## 2018-09-06 DIAGNOSIS — L919 Hypertrophic disorder of the skin, unspecified: Secondary | ICD-10-CM | POA: Diagnosis not present

## 2018-09-06 DIAGNOSIS — A630 Anogenital (venereal) warts: Secondary | ICD-10-CM | POA: Diagnosis not present

## 2018-09-07 ENCOUNTER — Other Ambulatory Visit: Payer: Self-pay

## 2018-09-08 ENCOUNTER — Encounter: Payer: Self-pay | Admitting: Cardiothoracic Surgery

## 2018-09-08 ENCOUNTER — Ambulatory Visit: Payer: 59 | Admitting: Cardiothoracic Surgery

## 2018-09-08 VITALS — BP 112/70 | HR 75 | Temp 97.7°F | Resp 20 | Ht 61.5 in | Wt 140.0 lb

## 2018-09-08 DIAGNOSIS — I25119 Atherosclerotic heart disease of native coronary artery with unspecified angina pectoris: Secondary | ICD-10-CM | POA: Diagnosis not present

## 2018-09-08 DIAGNOSIS — Z951 Presence of aortocoronary bypass graft: Secondary | ICD-10-CM | POA: Diagnosis not present

## 2018-09-08 DIAGNOSIS — I5030 Unspecified diastolic (congestive) heart failure: Secondary | ICD-10-CM | POA: Diagnosis not present

## 2018-09-08 NOTE — Progress Notes (Signed)
PCP is Scifres, Earlie Server, PA-C Referring Provider is Jettie Booze, MD  Chief Complaint  Patient presents with  . Neurologic Problem    6 month f/u HX of CABG  . Coronary Artery Disease    HPI: Scheduled six-month follow-up after urgent CABG x2 1.5 years ago for unstable angina and non-STEMI.  She has chronic liver disease and is close to having a liver transplant at [facility].  Earlier this summer she had a Cardiolite stress test which was negative for ischemia, normal LVEF.  She still has tenderness over her sternal incision at the site of a double weave wire.  She denies angina.  She is on Lasix for ascites and fluid retention.   Past Medical History:  Diagnosis Date  . Arthritis    right knee  . Ascites--mild this admit 11/06/2017  . CAD (coronary artery disease)    a. s/p CABGx2 in 02/2017 (LAD not suitable for PCI), EF normal.  . Familial hyperlipidemia   . GERD (gastroesophageal reflux disease)   . IBS (irritable bowel syndrome)   . PONV (postoperative nausea and vomiting)   . Primary biliary cirrhosis ([place])   . S/P CABG (coronary artery bypass graft)   . SVD (spontaneous vaginal delivery)    x 3  . Urinary tract bacterial infections     Past Surgical History:  Procedure Laterality Date  . CORONARY ARTERY BYPASS GRAFT N/A 03/24/2017   Procedure: CORONARY ARTERY BYPASS GRAFTING (CABG) x two, using left internal mammary artery, left radial artery, and right leg greater saphenous vein harvested endoscopically;  Surgeon: Ivin Poot, MD;  Location: [place];  Service: Open Heart Surgery;  Laterality: N/A;  . ENDOVEIN HARVEST OF GREATER SAPHENOUS VEIN Right 03/24/2017   Procedure: ENDOVEIN HARVEST OF GREATER SAPHENOUS VEIN;  Surgeon: Ivin Poot, MD;  Location: [place];  Service: Open Heart Surgery;  Laterality: Right;  . ESOPHAGEAL BANDING  02/01/2018   Procedure: ESOPHAGEAL BANDING;  Surgeon: [name], MD;  Location: [facility];  Service: Gastroenterology;;  .  ESOPHAGEAL BANDING N/A 03/29/2018   Procedure: ESOPHAGEAL BANDING;  Surgeon: [name], MD;  [facility];  Service: Gastroenterology;  Laterality: N/A;  . ESOPHAGEAL BANDING N/A 05/21/2018   Procedure: ESOPHAGEAL BANDING;  Surgeon: [name], MD;  Location: [facility];  Service: Gastroenterology;  Laterality: N/A;  . ESOPHAGOGASTRODUODENOSCOPY N/A 03/29/2018   Procedure: ESOPHAGOGASTRODUODENOSCOPY (EGD);  Surgeon: [name], MD;  Location: [facility];  Service: Gastroenterology;  Laterality: N/A;  . ESOPHAGOGASTRODUODENOSCOPY N/A 05/21/2018   Procedure: ESOPHAGOGASTRODUODENOSCOPY (EGD);  Surgeon: [name], MD;  Location: [facility];  Service: Gastroenterology;  Laterality: N/A;  . ESOPHAGOGASTRODUODENOSCOPY (EGD) WITH PROPOFOL N/A 02/01/2018   Procedure: ESOPHAGOGASTRODUODENOSCOPY (EGD) WITH PROPOFOL;  Surgeon: [name], MD;  [facility]: [facility];  Service: Gastroenterology;  Laterality: N/A;  . INCONTINENCE SURGERY    . LEEP N/A 03/28/2014   Procedure: LOOP ELECTROSURGICAL EXCISION PROCEDURE (LEEP) cone biopsy;  Surgeon: Cyril Mourning, MD;  Location: [facility] [place] [facility];  Service: Gynecology;  Laterality: N/A;  . LEFT HEART CATH AND CORONARY ANGIOGRAPHY N/A 03/23/2017   Procedure: LEFT HEART CATH AND CORONARY ANGIOGRAPHY;  Surgeon: Jettie Booze, MD;  Location: [place] [facility];  Service: Cardiovascular;  Laterality: N/A;  . LIVER BIOPSY     x 2  . RADIAL ARTERY HARVEST Left 03/24/2017   Procedure: RADIAL ARTERY HARVEST;  Surgeon: Ivin Poot, MD;  Location: [place];  Service: Open Heart Surgery;  Laterality: Left;  . TEE WITHOUT CARDIOVERSION  N/A 03/24/2017   Procedure: TRANSESOPHAGEAL ECHOCARDIOGRAM (TEE);  Surgeon: Prescott Gum, Collier Salina, MD;  Location: [place];  Service: Open Heart Surgery;  Laterality: N/A;  . WISDOM TOOTH EXTRACTION      Family History  Problem Relation Age of Onset  . CAD Father   . Diabetes Mellitus II Father   . Heart disease Father   . Heart  attack Brother   . Heart disease Maternal Aunt   . Heart attack Paternal Grandmother   . Heart attack Paternal Grandfather     Social History Social History   Tobacco Use  . Smoking status: Never Smoker  . Smokeless tobacco: Never Used  Substance Use Topics  . Alcohol use: Yes    Alcohol/week: 1.0 standard drinks    Types: 1 Glasses of wine per week    Comment: for dinner  . Drug use: No    Current Outpatient Medications  Medication Sig Dispense Refill  . Alirocumab (PRALUENT) 150 MG/ML SOAJ Inject 1 Dose into the skin every 14 (fourteen) days. 2 pen 11  . aspirin 81 MG EC tablet Take 81 mg by mouth daily. Swallow whole.    . calcium carbonate (TUMS - DOSED IN MG ELEMENTAL CALCIUM) 500 MG chewable tablet Chew 2 tablets by mouth daily.    . furosemide (LASIX) 20 MG tablet Take 1 tablet (20 mg total) by mouth daily. 30 tablet 6  . hydrOXYzine (ATARAX/VISTARIL) 25 MG tablet Take 25 mg by mouth at bedtime as needed for itching.     . imiquimod (ALDARA) 5 % cream Apply 1 application topically 3 (three) times a week.    . Melatonin 3 MG CAPS Take 6 mg by mouth at bedtime as needed (for sleep).    . metoprolol tartrate (LOPRESSOR) 25 MG tablet Take 0.5 tablets (12.5 mg total) by mouth 2 (two) times daily. 90 tablet 1  . metroNIDAZOLE (METROCREAM) 0.75 % cream Apply 1 application topically 2 (two) times daily.    . Multiple Vitamins-Minerals (DEKAS PLUS PO) Take 1 capsule by mouth daily.    . nitroGLYCERIN (NITROSTAT) 0.4 MG SL tablet Place 1 tablet (0.4 mg total) under the tongue every 5 (five) minutes as needed for chest pain. 25 tablet 6  . Probiotic Product (PROBIOTIC PO) Take 1 capsule by mouth daily.     Marland Kitchen spironolactone (ALDACTONE) 50 MG tablet Take 50 mg by mouth daily.   1  . sucralfate (CARAFATE) 1 GM/10ML suspension Take 10 mLs (1 g total) by mouth 4 (four) times daily. 420 mL 1  . triamcinolone cream (KENALOG) 0.1 % Apply 1 application topically 2 (two) times daily.    .  ursodiol (ACTIGALL) 300 MG capsule Take 300 mg by mouth 3 (three) times daily.      No current facility-administered medications for this visit.     Allergies  Allergen Reactions  . Codeine Nausea And Vomiting  . Erythromycin Nausea And Vomiting  . 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  Sternal incision well-healed.  Palpable wire remains tender.   Abdomen- soft, non-tender   Extremities - warm, non-tender, minimal edema   Neuro- oriented, appropriate, no focal weakness   Diagnostic Tests: None  Impression: Cardiac status stable She is being followed by cardiology regularly for her dyslipidemia and history of CAD.  She is tolerating aspirin 81 mg and is on twice monthly injections for her dyslipidemia. Plan: Return as needed.  If she needs the sternal wire removed after her liver transplant she will call us.   Len Childs, MD [facility] [phone]

## 2018-09-13 ENCOUNTER — Ambulatory Visit: Admit: 2018-09-13 | Discharge: 2018-09-14 | Payer: PRIVATE HEALTH INSURANCE

## 2018-09-13 DIAGNOSIS — K7290 Hepatic failure, unspecified without coma: Principal | ICD-10-CM

## 2018-09-13 DIAGNOSIS — K766 Portal hypertension: Secondary | ICD-10-CM | POA: Diagnosis not present

## 2018-09-13 DIAGNOSIS — K7469 Other cirrhosis of liver: Secondary | ICD-10-CM | POA: Diagnosis not present

## 2018-09-13 DIAGNOSIS — R161 Splenomegaly, not elsewhere classified: Secondary | ICD-10-CM | POA: Diagnosis not present

## 2018-09-20 ENCOUNTER — Other Ambulatory Visit: Payer: Self-pay | Admitting: Cardiology

## 2018-09-20 MED ORDER — METOPROLOL TARTRATE 25 MG PO TABS: 12.5000 mg | ORAL_TABLET | Freq: Two times a day (BID) | ORAL | 2 refills | Status: DC

## 2018-09-20 MED FILL — METOPROLOL TARTRATE 25 MG T: 25 | 90 days supply | Qty: 90 | Fill #0

## 2018-09-20 NOTE — Telephone Encounter (Signed)
New Message    *STAT* If patient is at the pharmacy, call can be transferred to refill team.   1. Which medications need to be refilled? (please list name of each medication and dose if known) metoprolol tartrate (LOPRESSOR) 25 MG tablet  2. Which pharmacy/location (including street and city if local pharmacy) is medication to be sent to? [place], [place]  3. Do they need a 30 day or 90 day supply? 90 day supply

## 2018-09-22 ENCOUNTER — Other Ambulatory Visit: Payer: Self-pay | Admitting: Gastroenterology

## 2018-09-22 MED FILL — SPIRONOLACTONE 100 MG TAB: 100 | 90 days supply | Qty: 90 | Fill #0

## 2018-09-22 MED FILL — FUROSEMIDE 40 MG TAB: 40 | 90 days supply | Qty: 90 | Fill #0

## 2018-10-05 DIAGNOSIS — L570 Actinic keratosis: Secondary | ICD-10-CM | POA: Diagnosis not present

## 2018-10-05 DIAGNOSIS — K644 Residual hemorrhoidal skin tags: Secondary | ICD-10-CM | POA: Diagnosis not present

## 2018-10-05 DIAGNOSIS — K130 Diseases of lips: Secondary | ICD-10-CM | POA: Diagnosis not present

## 2018-10-07 ENCOUNTER — Other Ambulatory Visit (HOSPITAL_COMMUNITY)
Admission: RE | Admit: 2018-10-07 | Discharge: 2018-10-07 | Disposition: A | Discharge status: Home / Self Care | Payer: 59 | Source: Ambulatory Visit | Attending: Gastroenterology | Admitting: Gastroenterology

## 2018-10-07 DIAGNOSIS — Z20828 Contact with and (suspected) exposure to other viral communicable diseases: Secondary | ICD-10-CM | POA: Diagnosis not present

## 2018-10-07 DIAGNOSIS — Z01812 Encounter for preprocedural laboratory examination: Secondary | ICD-10-CM | POA: Diagnosis not present

## 2018-10-07 LAB — SARS CORONAVIRUS 2 (TAT 6-24 HRS): SARS Coronavirus 2: NEGATIVE

## 2018-10-08 ENCOUNTER — Other Ambulatory Visit: Payer: Self-pay

## 2018-10-08 ENCOUNTER — Encounter (HOSPITAL_COMMUNITY): Payer: Self-pay | Admitting: Emergency Medicine

## 2018-10-08 NOTE — H&P (Signed)
History of Present Illness  General:  This was an audiovisual meetings using zoom that lasted from 10:35 to 10:51. 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 labs from outside facility showed TB was 6.2, Cr was 0.57 and INR was 1.1(PT of 11.6), MELD na was 15, AST/ALT/ALp were 74/41/317. Patient states she is doing the same and has been back to work as a Pharmacist, community. She has not been sleeping well. Has issues with hemorrhoids intermittently, improved with use of antisocial suppositories. She reports some mild rectal bleeding which she attributes to anal warts. She continues to take lactulose 15 ML a day, may not have a bowel movement sometimes up to 3 days and has increased gas and pain thereafter. She has a dermatology follow-up in 07/26/2018 as she reports not much improvement in anal warts despite 2 rounds of HPV vaccine cryosurgery and local therapy.   Current Medications  Taking   Anusol-HC(Hydrocortisone Acetate) 25 MG Suppository 1 suppository Rectal twice a day as needed   Furosemide 20 MG Tablet 1 tablet Orally Once a day   Ursodiol 300 MG Capsule 3 capsules in am and 2 capsules in pm Orally Once a day   Lactulose 10 GM/15ML Syrup 15 ml Orally Once a day   Metronidazole 6.38 % Cream 1 application External Once a day   Nitroglycerin 0.4 MG Tablet Sublingual as directed Sublingual , Notes: listed in epic   Probiotic - Capsule 1 capsule Orally once a day, Notes: LIsted in Epic   Spironolactone 50 MG Tablet 1 tablet with food Orally Once a day   Triamcinolone Acetonide 0.1 % Cream 1 application External Once a day   Alirocumab 75 MG/ML Solution Auto-injector injection Subcutaneous every 14 days, Notes: listed in epic   DEKAs Plus - Capsule 1 capsule Orally once a day   Metoprolol Succinate 25 MG Capsule ER 24 Hour  Sprinkle 1/2 capsule Orally bid   Aspir-Low(Aspirin) 81 MG Tablet Delayed Release 1 tablet Orally Once a day   HydrOXYzine HCl 25 MG Tablet 1 tablet as needed for pruritus Orally every 6 hrs   Not-Taking   MVI 1 tablet oral daily   Anusol-HC(Hydrocortisone Acetate) 25 MG Suppository 1 suppository Rectal twice a day   Calcium Tablet 1200 mg 1 tablet with food Orally once a day   CycloSPORINE 0.05 % Emulsion 1 drop into both eyes Ophthalmic once a day, Notes: Listed in Epic   DiphenhydrAMINE HCl 25 MG Capsule 2 capsules at bedtime as needed for itching Orally once a day, Notes: LIsted in Epic   Hydrocortisone 2.5 % Lotion 1 application to affected area Externally Twice a day   Hydrocortisone Cream 2.5% cream apply topical a the anal area three times a day   Ibuprofen 600 MG Tablet 1 tablet as needed for headache, mild pain or moderate pain Orally every 6 hours, Notes: Hospital   Naltrexone HCl 50 MG Tablet 1 tablet Orally Once a day, Notes: listed in epic   Omeprazole 20 MG Capsule Delayed Release 1 capsule Orally twice a day, Notes: as needed   Restoril(Temazepam) 30 MG Capsule 1 capsule at bedtime as needed Orally Once a day   Medication List reviewed and reconciled with the patient    Past Medical History  cirrhosis (PBC) - Dr. Therisa Doyne.   hx of UTI.   Jaundice.   Osteopenia.  Abdominal pain.   Hiatal hernia, small.   Esophagitis.   CAD s/p CABG x 2- Dr. Geraldo Pitter, cardiology.   ob/gyn - Dr. Candie Mile.   Primary biliary cholangitis.    Surgical History  bladder sling 2009  wisdom teeth extraction in her 29s  EGD-01/21/17, 2019, 2020   CABG- with double bypass 02/2017  LEEP 2014  colonoscopy 01/2018   Family History  Father: alive, diabetes, HTN,heart attack,high cholesterol, diagnosed with Diabetes, Hypertension  Mother: alive, oschemic colitis, esophageal stricture,high cholesterol, Hypertension  Paternal [place] Father: deceased  Paternal [place] Mother: deceased   Maternal Grand Father: deceased, Colon cancer  Maternal Grand Mother: deceased  Maternal uncle: deceased, Colon cancer  1 sister(s) . 2 son(s) , 1 daughter(s) .   No Family History of Colon Polyps or Liver Disease.   Social History  General:  no EXPOSURE TO PASSIVE SMOKE.  no Alcohol.  Children: 3.  Seat belt use: yes.  Tobacco use  cigarettes: Never smoked Tobacco history last updated 07/12/2018 Religion: Christian.  Marital Status: married.  no Recreational drug use.  OCCUPATION: Radiology/CT Tech..  Exercise: cardiac rehab, 3 times per week.    Allergies  Codeine (for allergy): nausea and vomitting - Allergy  e-mycin: N&A - Allergy  Fentanyl: vomiting - Side Effects   Hospitalization/Major Diagnostic Procedure  Chest/Arm Pain (Esophageal Spasms) 10/2016  open heart surgery 02/2017  childbirth x3   Chest Pain 11/05/2017  acute upper GI bleed 01/2018   Review of Systems  GI PROCEDURE:  no Pacemaker/ AICD, no. no Artificial heart valves. no MI/heart attack. no Abnormal heart rhythm. no Angina. no CVA. no Hypertension. no Hypotension. no Asthma, COPD. no Sleep apnea. no Seizure disorders. no Artificial joints. no Severe DJD. no Diabetes. no Significant headaches. no Vertigo. no Depression/anxiety. no Abnormal bleeding. no Kidney Disease. Liver disease yes. no Chance of pregnancy. Blood transfusion yes.      Vital Signs  Wt 140 per pt, Wt change 3 lb, Ht 61, BMI 26.45.   Examination  Gastroenterology:: GENERAL APPEARANCE: Well developed, well nourished, no active distress, pleasant.  SCLERA: anicteric.  RESPIRATORY able to speak in full sentences, not in respiratory distress.  NEURO: alert, oriented to time,place and person.  PSYCH: mood/affect normal.  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    Assessments   1. Primary biliary cholangitis - K74.3 (Primary)   2. Other hemorrhoids - K64.8   3. Esophageal varices determined by endoscopy - I85.00   4. History of  ascites - Z87.898   5. Anal warts - A63.0   Treatment  1. Primary biliary cholangitis  Notes: Cotninue Ursodiol 900 mg in am and 600 mg in pm with lactulose 10-15 ml a day.    2. Other hemorrhoids  Notes: Anusol suppositories as needed.    3. Esophageal varices determined by endoscopy  Notes: Plan repeat EGD    4. History of ascites  Notes: Doing well on lasix ans spironolactone.    5. Anal warts  Notes: F/U with dermatology, colonsocopy from 12/19 showed no anal lesions, if perianal lesions worsen, may consider flexible sigmoidoscopy.    [name], MD

## 2018-10-08 NOTE — Progress Notes (Signed)
Pre-op endo call completed  

## 2018-10-10 NOTE — Anesthesia Preprocedure Evaluation (Addendum)
Anesthesia Evaluation  Patient identified by MRN, date of birth, ID band Patient awake    Reviewed: Allergy & Precautions, NPO status , Patient's Chart, lab work & pertinent test results, reviewed documented beta blocker date and time   History of Anesthesia Complications (+) PONV  Airway Mallampati: II  TM Distance: >3 FB Neck ROM: Full    Dental  (+) Dental Advisory Given   Pulmonary  10/07/2018 SARS coronavirus NEG   breath sounds clear to auscultation       Cardiovascular hypertension, Pt. on medications and Pt. on home beta blockers (-) angina+ CAD and + CABG   Rhythm:Regular Rate:Normal + Systolic murmurs 10/8919 Stress test: EF 60%, no ST changes    Neuro/Psych negative neurological ROS     GI/Hepatic GERD  Medicated and Poorly Controlled,(+) Cirrhosis   Esophageal Varices and ascites    , Biliary cirrhosis   Endo/Other  negative endocrine ROS  Renal/GU negative Renal ROS     Musculoskeletal   Abdominal   Peds  Hematology   Anesthesia Other Findings   Reproductive/Obstetrics                            Anesthesia Physical Anesthesia Plan  ASA: III  Anesthesia Plan: MAC   Post-op Pain Management:    Induction:   PONV Risk Score and Plan: 3 and Ondansetron  Airway Management Planned: Nasal Cannula and Natural Airway  Additional Equipment:   Intra-op Plan:   Post-operative Plan:   Informed Consent: I have reviewed the patients History and Physical, chart, labs and discussed the procedure including the risks, benefits and alternatives for the proposed anesthesia with the patient or authorized representative who has indicated his/her understanding and acceptance.     Dental advisory given  Plan Discussed with: CRNA and Surgeon  Anesthesia Plan Comments:        Anesthesia Quick Evaluation

## 2018-10-11 ENCOUNTER — Ambulatory Visit (HOSPITAL_COMMUNITY)
Admission: RE | Admit: 2018-10-11 | Discharge: 2018-10-11 | Disposition: A | Discharge status: Home / Self Care | Payer: 59 | Attending: Gastroenterology | Admitting: Gastroenterology

## 2018-10-11 ENCOUNTER — Encounter (HOSPITAL_COMMUNITY): Payer: Self-pay | Admitting: *Deleted

## 2018-10-11 ENCOUNTER — Ambulatory Visit (HOSPITAL_COMMUNITY): Payer: 59 | Admitting: Anesthesiology

## 2018-10-11 ENCOUNTER — Other Ambulatory Visit: Payer: Self-pay

## 2018-10-11 ENCOUNTER — Encounter (HOSPITAL_COMMUNITY)
Admission: RE | Disposition: A | Discharge status: Home / Self Care | Payer: Self-pay | Source: Home / Self Care | Attending: Gastroenterology

## 2018-10-11 DIAGNOSIS — K3189 Other diseases of stomach and duodenum: Secondary | ICD-10-CM | POA: Insufficient documentation

## 2018-10-11 DIAGNOSIS — Z7982 Long term (current) use of aspirin: Secondary | ICD-10-CM | POA: Insufficient documentation

## 2018-10-11 DIAGNOSIS — Z881 Allergy status to other antibiotic agents status: Secondary | ICD-10-CM | POA: Insufficient documentation

## 2018-10-11 DIAGNOSIS — Z1159 Encounter for screening for other viral diseases: Secondary | ICD-10-CM | POA: Diagnosis not present

## 2018-10-11 DIAGNOSIS — I8510 Secondary esophageal varices without bleeding: Secondary | ICD-10-CM | POA: Insufficient documentation

## 2018-10-11 DIAGNOSIS — Z79899 Other long term (current) drug therapy: Secondary | ICD-10-CM | POA: Diagnosis not present

## 2018-10-11 DIAGNOSIS — Z951 Presence of aortocoronary bypass graft: Secondary | ICD-10-CM | POA: Insufficient documentation

## 2018-10-11 DIAGNOSIS — K209 Esophagitis, unspecified: Secondary | ICD-10-CM | POA: Diagnosis not present

## 2018-10-11 DIAGNOSIS — Z885 Allergy status to narcotic agent status: Secondary | ICD-10-CM | POA: Diagnosis not present

## 2018-10-11 DIAGNOSIS — K766 Portal hypertension: Secondary | ICD-10-CM | POA: Diagnosis not present

## 2018-10-11 DIAGNOSIS — K743 Primary biliary cirrhosis: Secondary | ICD-10-CM | POA: Diagnosis not present

## 2018-10-11 DIAGNOSIS — D62 Acute posthemorrhagic anemia: Secondary | ICD-10-CM | POA: Diagnosis not present

## 2018-10-11 DIAGNOSIS — K219 Gastro-esophageal reflux disease without esophagitis: Secondary | ICD-10-CM | POA: Diagnosis not present

## 2018-10-11 DIAGNOSIS — I8500 Esophageal varices without bleeding: Secondary | ICD-10-CM | POA: Diagnosis not present

## 2018-10-11 DIAGNOSIS — I2510 Atherosclerotic heart disease of native coronary artery without angina pectoris: Secondary | ICD-10-CM | POA: Diagnosis not present

## 2018-10-11 HISTORY — PX: ESOPHAGOGASTRODUODENOSCOPY (EGD) WITH PROPOFOL: SHX5813

## 2018-10-11 HISTORY — PX: ESOPHAGEAL BANDING: SHX5518

## 2018-10-11 SURGERY — ESOPHAGOGASTRODUODENOSCOPY (EGD) WITH PROPOFOL
Anesthesia: Monitor Anesthesia Care

## 2018-10-11 MED ORDER — LIDOCAINE 2% (20 MG/ML) 5 ML SYRINGE
INTRAMUSCULAR | Status: DC | PRN
  Administered 2018-10-11: 60 mg via INTRAVENOUS

## 2018-10-11 MED ORDER — PROPOFOL 10 MG/ML IV BOLUS
INTRAVENOUS | Status: AC
  Filled 2018-10-11: qty 60

## 2018-10-11 MED ORDER — LACTATED RINGERS IV SOLN
INTRAVENOUS | Status: DC
  Administered 2018-10-11: 1000 mL via INTRAVENOUS

## 2018-10-11 MED ORDER — ONDANSETRON HCL 4 MG/2ML IJ SOLN
INTRAMUSCULAR | Status: DC | PRN
  Administered 2018-10-11: 4 mg via INTRAVENOUS

## 2018-10-11 MED ORDER — SCOPOLAMINE 1 MG/3DAYS TD PT72
MEDICATED_PATCH | TRANSDERMAL | Status: AC
  Filled 2018-10-11: qty 1

## 2018-10-11 MED ORDER — PROPOFOL 500 MG/50ML IV EMUL
INTRAVENOUS | Status: DC | PRN
  Administered 2018-10-11: 150 ug/kg/min via INTRAVENOUS

## 2018-10-11 MED ORDER — SODIUM CHLORIDE 0.9 % IV SOLN: INTRAVENOUS | Status: DC

## 2018-10-11 MED ORDER — DEXAMETHASONE SODIUM PHOSPHATE 10 MG/ML IJ SOLN
INTRAMUSCULAR | Status: DC | PRN
  Administered 2018-10-11: 10 mg via INTRAVENOUS

## 2018-10-11 MED ORDER — PROPOFOL 10 MG/ML IV BOLUS
INTRAVENOUS | Status: DC | PRN
  Administered 2018-10-11: 30 mg via INTRAVENOUS
  Administered 2018-10-11: 40 mg via INTRAVENOUS
  Administered 2018-10-11: 30 mg via INTRAVENOUS
  Administered 2018-10-11: 40 mg via INTRAVENOUS
  Administered 2018-10-11: 30 mg via INTRAVENOUS
  Administered 2018-10-11: 40 mg via INTRAVENOUS

## 2018-10-11 MED ORDER — PHENYLEPHRINE 40 MCG/ML (10ML) SYRINGE FOR IV PUSH (FOR BLOOD PRESSURE SUPPORT)
PREFILLED_SYRINGE | INTRAVENOUS | Status: DC | PRN
  Administered 2018-10-11: 120 ug via INTRAVENOUS

## 2018-10-11 MED ORDER — SCOPOLAMINE 1 MG/3DAYS TD PT72
1.0000 | MEDICATED_PATCH | Freq: Once | TRANSDERMAL | Status: DC
  Administered 2018-10-11: 1.5 mg via TRANSDERMAL

## 2018-10-11 SURGICAL SUPPLY — 15 items

## 2018-10-11 NOTE — Interval H&P Note (Signed)
History and Physical Interval Note: 48/female with PBC, last EGD from 04/2018 for an EGD for banding of esophageal varices. 10/11/2018 8:38 AM  Christine Butler  has presented today for EGD, with the diagnosis of esophageal varices.  The various methods of treatment have been discussed with the patient and family. After consideration of risks, benefits and other options for treatment, the patient has consented to  Procedure(s): ESOPHAGOGASTRODUODENOSCOPY (EGD) WITH PROPOFOL (N/A) ESOPHAGEAL BANDING (N/A) as a surgical intervention.  The patient's history has been reviewed, patient examined, no change in status, stable for surgery.  I have reviewed the patient's chart and labs.  Questions were answered to the patient's satisfaction.     [name]

## 2018-10-11 NOTE — Brief Op Note (Signed)
10/11/2018  8:55 AM  PATIENT:  Christine Butler  49 y.o. female  PRE-OPERATIVE DIAGNOSIS:  esophageal varices  POST-OPERATIVE DIAGNOSIS:  esophageal varices banding  PROCEDURE:  Procedure(s): ESOPHAGOGASTRODUODENOSCOPY (EGD) WITH PROPOFOL (N/A) ESOPHAGEAL BANDING (N/A)  SURGEON:  Surgeon(s) and Role:    [name], MD - Primary  PHYSICIAN ASSISTANT:   ASSISTANTS: Cleda Daub, RN and Elspeth Cho, Tech  ANESTHESIA:   MAC  EBL:  Minimal  BLOOD ADMINISTERED:none  DRAINS: none   LOCAL MEDICATIONS USED:  NONE  SPECIMEN:  No Specimen  DISPOSITION OF SPECIMEN:  N/A  COUNTS:  YES  TOURNIQUET:  * No tourniquets in log *  DICTATION: .Dragon Dictation  PLAN OF CARE: Discharge to home after PACU  PATIENT DISPOSITION:  PACU - hemodynamically stable.   Delay start of Pharmacological VTE agent (>24hrs) due to surgical blood loss or risk of bleeding: no

## 2018-10-11 NOTE — Op Note (Signed)
[facility] Patient Name: Christine Butler Procedure Date: 10/11/2018 MRN: [id number] Attending MD: [name] , MD Date of Birth: 04-03-1970 CSN: [id number] Age: 48 Admit Type: Outpatient Procedure:                Upper GI endoscopy Indications:              For therapy of esophageal varices Providers:                [name], MD, Cleda Daub, RN, Elspeth Cho                            Tech., Technician, Lina Sar, Technician, Dellie Catholic Referring MD:              Medicines:                Monitored Anesthesia Care Complications:            No immediate complications. Estimated blood loss:                            Minimal. Estimated Blood Loss:     Estimated blood loss was minimal. Procedure:                Pre-Anesthesia Assessment:                           - Prior to the procedure, a History and Physical                            was performed, and patient medications and                            allergies were reviewed. The patient's tolerance of                            previous anesthesia was also reviewed. The risks                            and benefits of the procedure and the sedation                            options and risks were discussed with the patient.                            All questions were answered, and informed consent                            was obtained. Prior Anticoagulants: The patient has                            taken no previous anticoagulant or antiplatelet                            agents except for  aspirin. ASA Grade Assessment:                            III - A patient with severe systemic disease. After                            reviewing the risks and benefits, the patient was                            deemed in satisfactory condition to undergo the                            procedure.                           After obtaining informed consent, the endoscope was                  passed under direct vision. Throughout the                            procedure, the patient's blood pressure, pulse, and                            oxygen saturations were monitored continuously. The                            GIF-H190 ([id number]) Olympus gastroscope was                            introduced through the mouth, and advanced to the                            second part of duodenum. The upper GI endoscopy was                            accomplished without difficulty. The patient                            tolerated the procedure well. Scope In: Scope Out: Findings:      Grade II varices were found in the lower third of the esophagus. They       were small in size. Four bands were successfully placed with complete       eradication, resulting in deflation of varices. There was no bleeding at       the end of the procedure.      The Z-line was regular and was found 35 cm from the incisors.      Moderate portal hypertensive gastropathy was found in the stomach.      The cardia and gastric fundus were normal on retroflexion.      There was no evidence of gastric varices.      The examined duodenum was normal. Impression:               - Grade II esophageal varices. Completely  eradicated. Banded.                           - Z-line regular, 35 cm from the incisors.                           - Portal hypertensive gastropathy.                           - Normal examined duodenum.                           - No specimens collected. Moderate Sedation:      Patient did not receive moderate sedation for this procedure, but       instead received monitored anesthesia care. Recommendation:           - Patient has a contact number available for                            emergencies. The signs and symptoms of potential                            delayed complications were discussed with the                            patient. Return to normal  activities tomorrow.                            Written discharge instructions were provided to the                            patient.                           - Advance diet as tolerated.                           - Continue present medications.                           - Repeat upper endoscopy in 6 months for                            retreatment. Procedure Code(s):        --- Professional ---                           [phone], Esophagogastroduodenoscopy, flexible,                            transoral; with band ligation of esophageal/gastric                            varices Diagnosis Code(s):        --- Professional ---                           I85.00,  Esophageal varices without bleeding                           K76.6, Portal hypertension                           K31.89, Other diseases of stomach and duodenum CPT copyright 2019 American [employer]. All rights reserved. The codes documented in this report are preliminary and upon coder review may  be revised to meet current compliance requirements. [name], MD 10/11/2018 8:58:16 AM This report has been signed electronically. Number of Addenda: 0

## 2018-10-11 NOTE — Discharge Instructions (Signed)

## 2018-10-11 NOTE — Anesthesia Postprocedure Evaluation (Signed)
Anesthesia Post Note  Patient: Christine Butler  Procedure(s) Performed: ESOPHAGOGASTRODUODENOSCOPY (EGD) WITH PROPOFOL (N/A ) ESOPHAGEAL BANDING (N/A )     Patient location during evaluation: Endoscopy Anesthesia Type: MAC Level of consciousness: awake and alert, oriented and patient cooperative Pain management: pain level controlled Vital Signs Assessment: post-procedure vital signs reviewed and stable Respiratory status: spontaneous breathing, nonlabored ventilation and respiratory function stable Cardiovascular status: blood pressure returned to baseline and stable Postop Assessment: no apparent nausea or vomiting and adequate PO intake Anesthetic complications: no    Last Vitals:  Vitals:   10/11/18 0759 10/11/18 0904  BP: (!) 93/49 (!) 106/56  Pulse:  68  Resp:  17  Temp:  36.6 C  SpO2:  100%    Last Pain:  Vitals:   10/11/18 0904  TempSrc: Oral  PainSc: 4                  Richardine Peppers,E. Aleaya Latona

## 2018-10-11 NOTE — Transfer of Care (Signed)
Immediate Anesthesia Transfer of Care Note  Patient: Christine Butler  Procedure(s) Performed: ESOPHAGOGASTRODUODENOSCOPY (EGD) WITH PROPOFOL (N/A ) ESOPHAGEAL BANDING (N/A )  Patient Location: Endoscopy Unit  Anesthesia Type:MAC  Level of Consciousness: awake and patient cooperative  Airway & Oxygen Therapy: Patient Spontanous Breathing and Patient connected to nasal cannula oxygen  Post-op Assessment: Report given to RN and Post -op Vital signs reviewed and stable  Post vital signs: Reviewed and stable  Last Vitals:  Vitals Value Taken Time  BP    Temp    Pulse    Resp    SpO2      Last Pain:  Vitals:   10/11/18 0741  TempSrc: Oral  PainSc: 0-No pain         Complications: No apparent anesthesia complications

## 2018-10-12 ENCOUNTER — Encounter (HOSPITAL_COMMUNITY): Payer: Self-pay | Admitting: Gastroenterology

## 2018-10-12 DIAGNOSIS — K7290 Hepatic failure, unspecified without coma: Secondary | ICD-10-CM | POA: Diagnosis not present

## 2018-11-01 DIAGNOSIS — K7290 Hepatic failure, unspecified without coma: Secondary | ICD-10-CM

## 2018-11-08 ENCOUNTER — Ambulatory Visit: Payer: 59 | Admitting: Internal Medicine

## 2018-11-15 ENCOUNTER — Other Ambulatory Visit: Payer: Self-pay

## 2018-11-15 ENCOUNTER — Encounter: Payer: Self-pay | Admitting: Endocrinology

## 2018-11-15 ENCOUNTER — Ambulatory Visit (INDEPENDENT_AMBULATORY_CARE_PROVIDER_SITE_OTHER): Payer: 59 | Admitting: Endocrinology

## 2018-11-15 DIAGNOSIS — E559 Vitamin D deficiency, unspecified: Secondary | ICD-10-CM

## 2018-11-15 DIAGNOSIS — N914 Secondary oligomenorrhea: Secondary | ICD-10-CM

## 2018-11-15 DIAGNOSIS — M818 Other osteoporosis without current pathological fracture: Secondary | ICD-10-CM | POA: Diagnosis not present

## 2018-11-15 NOTE — Progress Notes (Addendum)
Patient ID: Christine Butler, female   DOB: Feb 15, 1971, 48 y.o.   MRN: [id number]           Referring HCP: Maude Leriche PA  Today's office visit was provided via telemedicine using video technique The patient was explained the limitations of evaluation and management by telemedicine and the availability of in person appointments.  The patient understood the limitations and agreed to proceed. Patient also understood that the telehealth visit is billable. . Location of the patient: Patient's home . Location of the provider: Physician office Only the patient and myself were participating in the encounter    Chief complaint: Osteoporosis  History of Present Illness:  The patient is referred here for osteoporosis.  She had a screening bone density done in 02/2018 because of her chronic liver disease and since she is a candidate for liver transplant Results of the bone density were as follows:  Spine: The Z score is -2.4 and the T score is -3.0. This value is below the fracture risk threshold.  The total bone mineral density in the proximal left femur measures 0.667 gm/cm2. The Z score is -1.9 and the T score is -2.3. This value is below the fracture risk threshold.  The femoral neck density is 0.532 gm/cm2, and the T score is -2.9. The other T scores range from -2.4 to -1.6.    No history of height loss, currently height 5 feet 1-1/2 inches  She has no history of low trauma fracture Mild L5 compression deformity noticed on MRI in 02/2018, unchanged from prior, she does not remember any symptoms with this.  ?  Menopause: She has had only 3 menstrual cycles in the last year, less since she had her coronary bypass surgery in 2019 Has not been evaluated by gynecologist for menopause  Previous treatment: None  Calcium supplements: 500 mg twice daily, using chewable calcium  Vitamin D supplements: None, a few years ago had been on supplements for low level, no recent levels  available     LABS:  Last calcium level on 10/12/2018 was 9.0 with albumin 3.1, previously 8.3  No results found for: VD25OH   Past Medical History:  Diagnosis Date  . Arthritis    right knee  . Ascites--mild this admit 11/06/2017   per patient , now stable with meds and lifestlye adjustment  . CAD (coronary artery disease)    a. s/p CABGx2 in 02/2017 (LAD not suitable for PCI), EF normal.  . Familial hyperlipidemia   . GERD (gastroesophageal reflux disease)   . IBS (irritable bowel syndrome)   . PONV (postoperative nausea and vomiting)    i always throw up on waking up , but last EGD in march had no issues    . Primary biliary cirrhosis (HCC)    cirhosis/liver disease followed by transplant team led by Dr Manuella Ghazi at [facility]   . S/P CABG (coronary artery bypass graft)   . SVD (spontaneous vaginal delivery)    x 3  . Urinary tract bacterial infections     Past Surgical History:  Procedure Laterality Date  . CORONARY ARTERY BYPASS GRAFT N/A 03/24/2017   Procedure: CORONARY ARTERY BYPASS GRAFTING (CABG) x two, using left internal mammary artery, left radial artery, and right leg greater saphenous vein harvested endoscopically;  Surgeon: Ivin Poot, MD;  Location: [place];  Service: Open Heart Surgery;  Laterality: N/A;  . ENDOVEIN HARVEST OF GREATER SAPHENOUS VEIN Right 03/24/2017   Procedure: ENDOVEIN HARVEST OF GREATER SAPHENOUS  VEIN;  Surgeon: Ivin Poot, MD;  Location: [place];  Service: Open Heart Surgery;  Laterality: Right;  . ESOPHAGEAL BANDING  02/01/2018   Procedure: ESOPHAGEAL BANDING;  Surgeon: [name], MD;  Location: [facility];  Service: Gastroenterology;;  . ESOPHAGEAL BANDING N/A 03/29/2018   Procedure: ESOPHAGEAL BANDING;  Surgeon: [name], MD;  Location: [facility];  Service: Gastroenterology;  Laterality: N/A;  . ESOPHAGEAL BANDING N/A 05/21/2018   Procedure: ESOPHAGEAL BANDING;  Surgeon: [name], MD;  Location: [facility];  Service:  Gastroenterology;  Laterality: N/A;  . ESOPHAGEAL BANDING N/A 10/11/2018   Procedure: ESOPHAGEAL BANDING;  Surgeon: [name], MD;  Location: [facility];  Service: Gastroenterology;  Laterality: N/A;  . ESOPHAGOGASTRODUODENOSCOPY N/A 03/29/2018   Procedure: ESOPHAGOGASTRODUODENOSCOPY (EGD);  Surgeon: [name], MD;  Location: [facility];  Service: Gastroenterology;  Laterality: N/A;  . ESOPHAGOGASTRODUODENOSCOPY N/A 05/21/2018   Procedure: ESOPHAGOGASTRODUODENOSCOPY (EGD);  Surgeon: [name], MD;  Location: [facility];  Service: Gastroenterology;  Laterality: N/A;  . ESOPHAGOGASTRODUODENOSCOPY (EGD) WITH PROPOFOL N/A 02/01/2018   Procedure: ESOPHAGOGASTRODUODENOSCOPY (EGD) WITH PROPOFOL;  Surgeon: [name], MD;  Location: [facility];  Service: Gastroenterology;  Laterality: N/A;  . ESOPHAGOGASTRODUODENOSCOPY (EGD) WITH PROPOFOL N/A 10/11/2018   Procedure: ESOPHAGOGASTRODUODENOSCOPY (EGD) WITH PROPOFOL;  Surgeon: [name], MD;  Location: [facility];  Service: Gastroenterology;  Laterality: N/A;  . INCONTINENCE SURGERY    . LEEP N/A 03/28/2014   Procedure: LOOP ELECTROSURGICAL EXCISION PROCEDURE (LEEP) cone biopsy;  Surgeon: Cyril Mourning, MD;  Location: [facility];  Service: Gynecology;  Laterality: N/A;  . LEFT HEART CATH AND CORONARY ANGIOGRAPHY N/A 03/23/2017   Procedure: LEFT HEART CATH AND CORONARY ANGIOGRAPHY;  Surgeon: Jettie Booze, MD;  Location: [facility];  Service: Cardiovascular;  Laterality: N/A;  . LIVER BIOPSY     x 2  . RADIAL ARTERY HARVEST Left 03/24/2017   Procedure: RADIAL ARTERY HARVEST;  Surgeon: Ivin Poot, MD;  Location: [place];  Service: Open Heart Surgery;  Laterality: Left;  . TEE WITHOUT CARDIOVERSION N/A 03/24/2017   Procedure: TRANSESOPHAGEAL ECHOCARDIOGRAM (TEE);  Surgeon: Prescott Gum, Collier Salina, MD;  Location: [place];  Service: Open Heart Surgery;  Laterality: N/A;  . WISDOM TOOTH EXTRACTION      Family History  Problem Relation Age  of Onset  . CAD Father   . Diabetes Mellitus II Father   . Heart disease Father   . Heart attack Brother   . Heart disease Maternal Aunt   . Heart attack Paternal Grandmother   . Heart attack Paternal Grandfather     Social History:  reports that she has never smoked. She has never used smokeless tobacco. She reports previous alcohol use of about 1.0 standard drinks of alcohol per week. She reports that she does not use drugs.  Allergies:  Allergies  Allergen Reactions  . Codeine Nausea And Vomiting  . Erythromycin Nausea And Vomiting  . Fentanyl Nausea And Vomiting    Allergies as of 11/15/2018      Reactions   Codeine Nausea And Vomiting   Erythromycin Nausea And Vomiting   Fentanyl Nausea And Vomiting      Medication List       Accurate as of November 15, 2018  9:35 AM. If you have any questions, ask your nurse or doctor.        Alirocumab 150 MG/ML Soaj Commonly known as: Praluent Inject 1 Dose into the skin every 14 (fourteen) days.   aspirin 81 MG EC  tablet Take 81 mg by mouth daily. Swallow whole.   calcium carbonate 500 MG chewable tablet Commonly known as: TUMS - dosed in mg elemental calcium Chew 2 tablets by mouth daily.   DEKAS PLUS PO Take 1 capsule by mouth daily.   furosemide 20 MG tablet Commonly known as: LASIX Take 1 tablet (20 mg total) by mouth daily.   hydrOXYzine 25 MG tablet Commonly known as: ATARAX/VISTARIL Take 25 mg by mouth at bedtime as needed for itching.   imiquimod 5 % cream Commonly known as: ALDARA Apply 1 application topically 3 (three) times a week.   Melatonin 3 MG Caps Take 6 mg by mouth at bedtime as needed (for sleep).   metoprolol tartrate 25 MG tablet Commonly known as: LOPRESSOR Take 0.5 tablets (12.5 mg total) by mouth 2 (two) times daily.   metroNIDAZOLE 0.75 % cream Commonly known as: METROCREAM Apply 1 application topically 2 (two) times daily.   nitroGLYCERIN 0.4 MG SL tablet Commonly known as:  NITROSTAT Place 1 tablet (0.4 mg total) under the tongue every 5 (five) minutes as needed for chest pain.   PROBIOTIC PO Take 1 capsule by mouth daily.   spironolactone 50 MG tablet Commonly known as: ALDACTONE Take 50 mg by mouth daily.   sucralfate 1 GM/10ML suspension Commonly known as: Carafate Take 10 mLs (1 g total) by mouth 4 (four) times daily.   triamcinolone cream 0.1 % Commonly known as: KENALOG Apply 1 application topically 2 (two) times daily.   ursodiol 300 MG capsule Commonly known as: ACTIGALL Take 300 mg by mouth 3 (three) times daily.        Review of Systems  Constitutional: Negative for weight loss.       Her appetite fluctuates and sometimes decreased  HENT: Negative for trouble swallowing.        Only rare headaches.  Respiratory: Negative for shortness of breath.   Cardiovascular: Negative for chest pain and leg swelling.  Gastrointestinal: Positive for nausea.       Occasional nausea Periodic constipation present, no diarrhea Occasionally will have abdominal swelling from ascites and is on chronic diuretics  Endocrine: Positive for fatigue. Negative for abnormal weight gain.  Genitourinary: Negative for frequency.  Musculoskeletal: Negative for joint pain and back pain.  Skin: Positive for rash and itching.       She is followed by a dermatologist for actinic keratoses  Neurological: Negative for weakness.   She has had primary biliary cirrhosis since 2006 and is a candidate for liver transplant [facility] by [facility]     PHYSICAL EXAM:  There were no vitals taken for this visit.    ASSESSMENT:   OSTEOPOROSIS with lowest T score -3.0  Osteoporosis likely related to her severe liver disease This is currently asymptomatic although she does have an old mild L5 compression deformity on MRI scan since 2019 at least No recent vitamin D levels available With normal calcium levels usually and no history of kidney stones unlikely as he has  hyperparathyroidism  She does need to start treatment because of the presence of osteoporosis despite being only perimenopausal and potential for further bone loss with her liver disease  Fatigue: Although likely to be from liver disease she has not had any evaluation of thyroid function from current records  Also not clear if she is in early menopause with her recent history of oligomenorrhea       PLAN:    Check vitamin D level  Discussed that treatment  options are usually initially bisphosphonates for her level of osteoporosis but since she has had upper gastrointestinal issues including variceal bleeding in the past would not be a candidate for a trial of oral bisphosphonates  If her vitamin D level is reasonably good she can be scheduled for Reclast infusion  She can continue her calcium supplements unchanged  Also will check [place], TSH  Once results are available patient will be sent to a my chart message for further actions  Consultation note sent to referring physician   Elayne Snare 11/15/2018, 9:35 AM   Total visit time for evaluation and management of review of extensive medical records, patient interview, coordination of care, evaluation of multiple problems and counseling = 40 minutes

## 2018-11-17 DIAGNOSIS — K7290 Hepatic failure, unspecified without coma: Secondary | ICD-10-CM | POA: Diagnosis not present

## 2018-11-19 ENCOUNTER — Other Ambulatory Visit: Payer: Self-pay

## 2018-11-23 DIAGNOSIS — H5213 Myopia, bilateral: Secondary | ICD-10-CM | POA: Diagnosis not present

## 2018-11-24 MED FILL — RESTASIS 0.05% EYE EMULSION: 0.05 | 90 days supply | Qty: 180 | Fill #0

## 2018-11-29 ENCOUNTER — Ambulatory Visit: Admit: 2018-11-29 | Discharge: 2018-11-29 | Payer: PRIVATE HEALTH INSURANCE

## 2018-11-29 DIAGNOSIS — Z23 Encounter for immunization: Secondary | ICD-10-CM

## 2018-11-29 DIAGNOSIS — Z79899 Other long term (current) drug therapy: Secondary | ICD-10-CM

## 2018-11-29 DIAGNOSIS — K7290 Hepatic failure, unspecified without coma: Secondary | ICD-10-CM

## 2018-11-29 DIAGNOSIS — G9340 Encephalopathy, unspecified: Secondary | ICD-10-CM

## 2018-11-29 DIAGNOSIS — M810 Age-related osteoporosis without current pathological fracture: Secondary | ICD-10-CM

## 2018-11-29 DIAGNOSIS — B1910 Unspecified viral hepatitis B without hepatic coma: Secondary | ICD-10-CM

## 2018-11-29 DIAGNOSIS — R188 Other ascites: Secondary | ICD-10-CM

## 2018-11-29 DIAGNOSIS — I2510 Atherosclerotic heart disease of native coronary artery without angina pectoris: Secondary | ICD-10-CM

## 2018-11-29 DIAGNOSIS — Z885 Allergy status to narcotic agent status: Secondary | ICD-10-CM

## 2018-11-29 DIAGNOSIS — I8501 Esophageal varices with bleeding: Secondary | ICD-10-CM

## 2018-11-29 DIAGNOSIS — Z7982 Long term (current) use of aspirin: Secondary | ICD-10-CM

## 2018-11-29 DIAGNOSIS — Z9102 Food additives allergy status: Secondary | ICD-10-CM

## 2018-11-29 DIAGNOSIS — K743 Primary biliary cirrhosis: Secondary | ICD-10-CM

## 2018-11-29 DIAGNOSIS — R161 Splenomegaly, not elsewhere classified: Secondary | ICD-10-CM

## 2018-11-29 DIAGNOSIS — Z951 Presence of aortocoronary bypass graft: Secondary | ICD-10-CM

## 2018-11-29 DIAGNOSIS — Z7682 Awaiting organ transplant status: Secondary | ICD-10-CM

## 2018-11-29 DIAGNOSIS — Z881 Allergy status to other antibiotic agents status: Secondary | ICD-10-CM

## 2018-12-02 ENCOUNTER — Ambulatory Visit: Payer: 59 | Admitting: Genetic Counselor

## 2018-12-02 ENCOUNTER — Other Ambulatory Visit: Payer: Self-pay

## 2018-12-03 ENCOUNTER — Other Ambulatory Visit: Payer: Self-pay

## 2018-12-03 DIAGNOSIS — M818 Other osteoporosis without current pathological fracture: Secondary | ICD-10-CM

## 2018-12-03 DIAGNOSIS — N914 Secondary oligomenorrhea: Secondary | ICD-10-CM

## 2018-12-13 ENCOUNTER — Other Ambulatory Visit: Payer: Self-pay

## 2018-12-13 ENCOUNTER — Encounter: Payer: 59 | Admitting: Nutrition

## 2018-12-13 ENCOUNTER — Ambulatory Visit (INDEPENDENT_AMBULATORY_CARE_PROVIDER_SITE_OTHER): Payer: 59 | Admitting: Nutrition

## 2018-12-13 DIAGNOSIS — M818 Other osteoporosis without current pathological fracture: Secondary | ICD-10-CM | POA: Diagnosis not present

## 2018-12-13 NOTE — Patient Instructions (Signed)
Continue to take your Calcium and Vit. D per Dr. Ronnie Derby order

## 2018-12-13 NOTE — Progress Notes (Signed)
Post Test Genetic Consult  Encounter Date: 12/02/2018  Christine Butler is here today for her post-test genetic consult for FH genetic testing. We reviewed her pedigree to determine changes in medical history since we last met. She noted that there have been no changes.   I informed her that her genetic test did not yield a pathogenic variant. She was relieved to hear this. She informs me that she is on the national waiting list for a liver transplant and is hopeful that this will improve her liver function and normalize her LDL-C levels.   I informed her that a positive yield is observed in  ~63% of patients with a definite clinical diagnosis of FH. 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 Individuals that tested negative for mutations in APOB, LDL-R and PCSK9 were found to have a large number of changes in their genome, each contributing to a small increase in LDL-C that cumulatively lead to an increase in LDL-C above the diagnostic cut-off. This is attributed to the polygenic inheritance seen in some with elevated LDL-C levels.   She verbalised understanding of her test result. I gave her a copy of her pedigree and test report and asked her to reach out to me if she had any further questions.  Lattie Corns, Ph.D, [facility] Clinical Molecular Geneticist

## 2018-12-13 NOTE — Progress Notes (Signed)
Per message from Dr. Dwyane Dee on 12/02/18, and after the patient signed the consent, and IV was started in her right forearm of Normal Saline.  After 3 minutes of infusion, the IV showed no signes of swelling, or infiltration.  5 MG. of Reclast was infused beginning at 11:16 and infused until 12PM  .The patient reported no discomfort suring the infusion..   After flushing the tubing with Normal Saline, the needle was removed.  The site showed no signs of redness or swelling.   Leonia Reader

## 2018-12-27 DIAGNOSIS — K7290 Hepatic failure, unspecified without coma: Principal | ICD-10-CM

## 2018-12-27 DIAGNOSIS — Z951 Presence of aortocoronary bypass graft: Secondary | ICD-10-CM

## 2018-12-27 DIAGNOSIS — E785 Hyperlipidemia, unspecified: Secondary | ICD-10-CM

## 2019-01-03 NOTE — Telephone Encounter (Signed)
Spoke with patient about need for fasting lipid panel before 11/13 visit. Advised she can use any [facility] test site. She is aware this need to be done for renewal of her Praluent Rx

## 2019-01-07 ENCOUNTER — Encounter: Payer: Self-pay | Admitting: Internal Medicine

## 2019-01-07 ENCOUNTER — Other Ambulatory Visit: Payer: Self-pay

## 2019-01-07 ENCOUNTER — Ambulatory Visit (INDEPENDENT_AMBULATORY_CARE_PROVIDER_SITE_OTHER): Payer: 59 | Admitting: Internal Medicine

## 2019-01-07 VITALS — BP 101/65 | HR 57 | Temp 97.0°F | Ht 62.0 in | Wt 139.0 lb

## 2019-01-07 DIAGNOSIS — E7849 Other hyperlipidemia: Secondary | ICD-10-CM

## 2019-01-07 DIAGNOSIS — Z951 Presence of aortocoronary bypass graft: Secondary | ICD-10-CM

## 2019-01-07 DIAGNOSIS — K743 Primary biliary cirrhosis: Secondary | ICD-10-CM

## 2019-01-07 MED FILL — PRALUENT 150 MG/ML PEN: 150 | 28 days supply | Qty: 2 | Fill #6

## 2019-01-07 MED FILL — URSODIOL 300 MG CAPS: 300 | 90 days supply | Qty: 450 | Fill #1

## 2019-01-07 NOTE — Patient Instructions (Signed)
Medication Instructions:  Your physician recommends that you continue on your current medications as directed. Please refer to the Current Medication list given to you today.  *If you need a refill on your cardiac medications before your next appointment, please call your pharmacy*  Lab Work: FASTING lab work If you have labs (blood work) drawn today and your tests are completely normal, you will receive your results only by: Marland Kitchen MyChart Message (if you have MyChart) OR . A paper copy in the mail If you have any lab test that is abnormal or we need to change your treatment, we will call you to review the results.  Testing/Procedures: NONE  Follow-Up: Dr. Debara Pickett recommends that you schedule a follow up visit with him the in the [facility] in 6 months.

## 2019-01-07 NOTE — Progress Notes (Signed)
OFFICE CONSULT NOTE  Chief Complaint:  Follow-up dyslipidemia  Primary Care Physician: Scifres, Earlie Server, PA-C  HPI:  Christine Butler is a 48 y.o. female who is being seen today for the evaluation of dyslipidemia at the request of Dr. Prescott Gum / Revenkar. This is a pleasant 48 year old female who unfortunately underwent urgent coronary bypass surgery in January 2019 after complaining of symptoms concerning for unstable angina.  She was found to have two-vessel coronary disease and underwent LIMA to Diagonal and left free radial artery graft to the LAD with interposition at the proximal anastamosis).  Other medical problems include PBC, IBS and GERD.  She has recently been followed by Dr. [name], with hepatology and liver transplant service at [facility].  She has persistent jaundice with elevated liver enzymes.  There is also strong family history of heart disease.  Her father had two-vessel bypass at age 77.  She had a brother died of MI.  Her paternal grandfather and grandmother both had MIs in their 43s.  She also has an aunt who has multiple cardiac issues.  She does have one sister who has normal cholesterol.  In addition she has 3 children aged 62, 43 and 54, none of which you have had cholesterol testing.  Most recently her lipid profile showed a total cholesterol 449, HDL of 11, triglycerides 333 and LDL 371.  Obviously a very abnormal lipid profile concerning for HeFH.  12/17/2017  Christine Butler returns today for follow-up.  In the interim she has been seen by her hepatologist at [facility].  She underwent a fibro-scan of the liver which suggested cirrhosis.  She has follow-up with him fairly soon to discuss further work-up options but is contemplating liver transplant.  She says she has several friends and a sister who is considering a living related donor option if she were a Orthoptist.  This obviously has the potential to perhaps care her familial hyperlipidemia however in the interim  awaiting a possible transplant she has significant increased cardiovascular risk.  We again reviewed the options of treatment including a less hepatically metabolized statin, obviously there is still risk for worsening liver enzymes, or PCSK9 inhibitor therapy.  PCSK9 inhibitor therapy in the interim would be likely the best tolerated therapy with the most significant reduction in cholesterol.  Of course this would be at the blessing of her hepatologist who I have already spoken with on this and feels that it would be reasonable therapy to consider.  04/01/2018  Christine Butler returns today for follow-up of her dyslipidemia.  She has been in continual follow-up with her hepatologist at [facility].  Most recently in November she was considered to be placed on the transplant list.  She has undergone a number of tests including a stress echocardiogram which was negative.  In the interim she was started on Praluent, which she is taking and tolerating well.  I am pleased to report that she has had a significant reduction in LDL cholesterol.  Her most recent labs as of March 29, 2018 indicate total cholesterol of 279, HDL of 15, LDL 231 (decreased from 331) and triglycerides 165.  So far this is not associated with any change in her liver enzymes, however given her end-stage liver disease, is likely that the low or normal liver enzymes indicate lack of liver synthetic function.  Overall, this is likely the most safe medication for her to use in the interim with her dyslipidemia.  Again I suspect that liver transplant will  likely improve her dyslipidemia significantly.  01/07/2019  Christine Butler returns today for follow-up.  Overall she seems to be doing well with Praluent.  She had significant reduction in her LDL cholesterol from greater than 300 down to the low 200s.  While this is still significantly elevated, she seems to be tolerating it and there is been no worsening liver function as a result of this.  Her meld score is  now 20, but amazingly is still too low for her transplant.  She apparently cannot get a living related donor transplant at [facility] because they do not do them, but would have to go [place] and the cost would be prohibitive for her.  PMHx:  Past Medical History:  Diagnosis Date   Arthritis    right knee   Ascites--mild this admit 11/06/2017   per patient , now stable with meds and lifestlye adjustment   CAD (coronary artery disease)    a. s/p CABGx2 in 02/2017 (LAD not suitable for PCI), EF normal.   Familial hyperlipidemia    GERD (gastroesophageal reflux disease)    IBS (irritable bowel syndrome)    PONV (postoperative nausea and vomiting)    i always throw up on waking up , but last EGD in march had no issues     Primary biliary cirrhosis (HCC)    cirhosis/liver disease followed by transplant team led by Dr Manuella Ghazi at [facility]    S/P CABG (coronary artery bypass graft)    SVD (spontaneous vaginal delivery)    x 3   Urinary tract bacterial infections     Past Surgical History:  Procedure Laterality Date   CORONARY ARTERY BYPASS GRAFT N/A 03/24/2017   Procedure: CORONARY ARTERY BYPASS GRAFTING (CABG) x two, using left internal mammary artery, left radial artery, and right leg greater saphenous vein harvested endoscopically;  Surgeon: Ivin Poot, MD;  Location: [place];  Service: Open Heart Surgery;  Laterality: N/A;   ENDOVEIN HARVEST OF GREATER SAPHENOUS VEIN Right 03/24/2017   Procedure: ENDOVEIN HARVEST OF GREATER SAPHENOUS VEIN;  Surgeon: Ivin Poot, MD;  Location: [place];  Service: Open Heart Surgery;  Laterality: Right;   ESOPHAGEAL BANDING  02/01/2018   Procedure: ESOPHAGEAL BANDING;  Surgeon: [name], MD;  Location: [facility];  Service: Gastroenterology;;   ESOPHAGEAL BANDING N/A 03/29/2018   Procedure: ESOPHAGEAL BANDING;  Surgeon: [name], MD;  Location: [facility];  Service: Gastroenterology;  Laterality: N/A;   ESOPHAGEAL  BANDING N/A 05/21/2018   Procedure: ESOPHAGEAL BANDING;  Surgeon: [name], MD;  Location: [facility];  Service: Gastroenterology;  Laterality: N/A;   ESOPHAGEAL BANDING N/A 10/11/2018   Procedure: ESOPHAGEAL BANDING;  Surgeon: [name], MD;  [facility];  Service: Gastroenterology;  Laterality: N/A;   ESOPHAGOGASTRODUODENOSCOPY N/A 03/29/2018   Procedure: ESOPHAGOGASTRODUODENOSCOPY (EGD);  Surgeon: [name], MD;  Location: [facility];  Service: Gastroenterology;  Laterality: N/A;   ESOPHAGOGASTRODUODENOSCOPY N/A 05/21/2018   Procedure: ESOPHAGOGASTRODUODENOSCOPY (EGD);  Surgeon: [name], MD;  Location: [facility];  Service: Gastroenterology;  Laterality: N/A;   ESOPHAGOGASTRODUODENOSCOPY (EGD) WITH PROPOFOL N/A 02/01/2018   Procedure: ESOPHAGOGASTRODUODENOSCOPY (EGD) WITH PROPOFOL;  Surgeon: [name], MD;  [facility];  Service: Gastroenterology;  Laterality: N/A;   ESOPHAGOGASTRODUODENOSCOPY (EGD) WITH PROPOFOL N/A 10/11/2018   Procedure: ESOPHAGOGASTRODUODENOSCOPY (EGD) WITH PROPOFOL;  Surgeon: [name], MD;  [facility];  Service: Gastroenterology;  Laterality: N/A;   INCONTINENCE SURGERY     LEEP N/A 03/28/2014   Procedure: LOOP ELECTROSURGICAL EXCISION  PROCEDURE (LEEP) cone biopsy;  Surgeon: Cyril Mourning, MD;  Location: [facility];  Service: Gynecology;  Laterality: N/A;   LEFT HEART CATH AND CORONARY ANGIOGRAPHY N/A 03/23/2017   Procedure: LEFT HEART CATH AND CORONARY ANGIOGRAPHY;  Surgeon: Jettie Booze, MD;  Location: [place] [facility];  Service: Cardiovascular;  Laterality: N/A;   LIVER BIOPSY     x 2   RADIAL ARTERY HARVEST Left 03/24/2017   Procedure: RADIAL ARTERY HARVEST;  Surgeon: Ivin Poot, MD;  Location: [place];  Service: Open Heart Surgery;  Laterality: Left;   TEE WITHOUT CARDIOVERSION N/A 03/24/2017   Procedure: TRANSESOPHAGEAL ECHOCARDIOGRAM (TEE);  Surgeon: Prescott Gum, Collier Salina, MD;  Location: [place];   Service: Open Heart Surgery;  Laterality: N/A;   WISDOM TOOTH EXTRACTION      FAMHx:  Family History  Problem Relation Age of Onset   CAD Father    Diabetes Mellitus II Father    Heart disease Father    Heart attack Brother    Heart disease Maternal Aunt    Heart attack Paternal Grandmother    Heart attack Paternal Grandfather     SOCHx:   reports that she has never smoked. She has never used smokeless tobacco. She reports previous alcohol use of about 1.0 standard drinks of alcohol per week. She reports that she does not use drugs.  ALLERGIES:  Allergies  Allergen Reactions   Codeine Nausea And Vomiting   Erythromycin Nausea And Vomiting   Fentanyl Nausea And Vomiting    ROS: Pertinent items noted in HPI and remainder of comprehensive ROS otherwise negative.  HOME MEDS: Current Outpatient Medications on File Prior to Visit  Medication Sig Dispense Refill   Alirocumab (PRALUENT) 150 MG/ML SOAJ Inject 1 Dose into the skin every 14 (fourteen) days. 2 pen 11   aspirin 81 MG EC tablet Take 81 mg by mouth daily. Swallow whole.     calcium carbonate (TUMS - DOSED IN MG ELEMENTAL CALCIUM) 500 MG chewable tablet Chew 2 tablets by mouth daily.     furosemide (LASIX) 20 MG tablet Take 1 tablet (20 mg total) by mouth daily. 30 tablet 6   hydrOXYzine (ATARAX/VISTARIL) 25 MG tablet Take 25 mg by mouth at bedtime as needed for itching.      Melatonin 3 MG CAPS Take 6 mg by mouth at bedtime as needed (for sleep).     metoprolol tartrate (LOPRESSOR) 25 MG tablet Take 0.5 tablets (12.5 mg total) by mouth 2 (two) times daily. 90 tablet 2   metroNIDAZOLE (METROCREAM) 0.75 % cream Apply 1 application topically 2 (two) times daily.     Multiple Vitamins-Minerals (DEKAS PLUS PO) Take 1 capsule by mouth daily.     nitroGLYCERIN (NITROSTAT) 0.4 MG SL tablet Place 1 tablet (0.4 mg total) under the tongue every 5 (five) minutes as needed for chest pain. 25 tablet 6   Probiotic  Product (PROBIOTIC PO) Take 1 capsule by mouth daily.      spironolactone (ALDACTONE) 50 MG tablet Take 50 mg by mouth daily.   1   sucralfate (CARAFATE) 1 GM/10ML suspension Take 10 mLs (1 g total) by mouth 4 (four) times daily. 420 mL 1   triamcinolone cream (KENALOG) 0.1 % Apply 1 application topically 2 (two) times daily.     ursodiol (ACTIGALL) 300 MG capsule Take 300 mg by mouth 3 (three) times daily.      imiquimod (ALDARA) 5 % cream Apply 1 application topically 3 (three) times a  week.     No current facility-administered medications on file prior to visit.     LABS/IMAGING: No results found for this or any previous visit (from the past 48 hour(s)). No results found.  LIPID PANEL:    Component Value Date/Time   CHOL 279 (H) 03/29/2018 1119   TRIG 165 (H) 03/29/2018 1119   HDL 15 (L) 03/29/2018 1119   CHOLHDL 18.6 (H) 03/29/2018 1119   CHOLHDL NOT CALCULATED 12/18/2017 0720   VLDL 38 12/18/2017 0720   LDLCALC 231 (H) 03/29/2018 1119   LDLDIRECT 229 (H) 12/30/2017 0856    WEIGHTS: Wt Readings from Last 3 Encounters:  01/07/19 139 lb (63 kg)  10/11/18 134 lb 14.7 oz (61.2 kg)  09/08/18 140 lb (63.5 kg)    VITALS: BP 101/65    Pulse (!) 57    Temp (!) 97 F (36.1 C)    Ht 5\' 2"  (1.575 m)    Wt 139 lb (63 kg)    SpO2 100%    BMI 25.42 kg/m   EXAM: General appearance: alert, icteric and no distress Neck: no carotid bruit, no JVD and thyroid not enlarged, symmetric, no tenderness/mass/nodules Lungs: clear to auscultation bilaterally Heart: regular rate and rhythm Abdomen: soft, non-tender; bowel sounds normal; no masses,  no organomegaly Extremities: extremities normal, atraumatic, no cyanosis or edema Pulses: 2+ and symmetric Skin: jaundiced Neurologic: Grossly normal Psych: Pleasant  EKG: Deferred  ASSESSMENT: 1. 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 ASCVD with recent two-vessel CABG (02/2017) 3. Strong family history of premature coronary artery disease 4. PBC, with  cirrhosis and ongoing jaundice (followed by Dr. [name] at [facility])  PLAN: 1.   Ms. Skenandore has had marked improvement in her lipids on Praluent, but of course remains well above target.  It is unlikely will reach goals on additional therapy and there is risk of significant worsening of her liver disease.  I suspect that when she were to get a transplant her numbers will improve significantly.  They may even normalize.  We will plan to continue current therapy and repeat a lipid profile.  Follow-up with me annually or sooner as necessary.  Pixie Casino, MD, [facility], [place] Director of the Advanced Lipid Disorders &  Cardiovascular Risk Reduction Clinic Diplomate of the American Board of Clinical Lipidology Attending Cardiologist  Direct Dial: [phone]   Fax: [phone]  Website:  [email or web address] 01/07/2019, 4:36 PM

## 2019-01-10 DIAGNOSIS — K7290 Hepatic failure, unspecified without coma: Secondary | ICD-10-CM | POA: Diagnosis not present

## 2019-01-10 DIAGNOSIS — E785 Hyperlipidemia, unspecified: Secondary | ICD-10-CM | POA: Diagnosis not present

## 2019-01-10 DIAGNOSIS — Z951 Presence of aortocoronary bypass graft: Secondary | ICD-10-CM | POA: Diagnosis not present

## 2019-01-11 LAB — LIPID PANEL
Chol/HDL Ratio: 16.6 ratio — ABNORMAL HIGH (ref 0.0–4.4)
Cholesterol, Total: 249 mg/dL — ABNORMAL HIGH (ref 100–199)
HDL: 15 mg/dL — ABNORMAL LOW (ref >39)
LDL Chol Calc (NIH): 211 mg/dL — ABNORMAL HIGH (ref 0–99)
Triglycerides: 124 mg/dL (ref 0–149)
VLDL Cholesterol Cal: 23 mg/dL (ref 5–40)

## 2019-01-12 ENCOUNTER — Telehealth: Payer: Self-pay | Admitting: Internal Medicine

## 2019-01-12 NOTE — Telephone Encounter (Signed)
Prior authorization for Praluent submitted via [email or web address]

## 2019-01-16 NOTE — Progress Notes (Deleted)
Patient ID: Christine Butler, female   DOB: Feb 15, 1971, 48 y.o.   MRN: [id number]           Referring HCP: Maude Leriche PA  Today's office visit was provided via telemedicine using video technique The patient was explained the limitations of evaluation and management by telemedicine and the availability of in person appointments.  The patient understood the limitations and agreed to proceed. Patient also understood that the telehealth visit is billable. . Location of the patient: Patient's home . Location of the provider: Physician office Only the patient and myself were participating in the encounter    Chief complaint: Osteoporosis  History of Present Illness:  The patient is referred here for osteoporosis.  She had a screening bone density done in 02/2018 because of her chronic liver disease and since she is a candidate for liver transplant Results of the bone density were as follows:  Spine: The Z score is -2.4 and the T score is -3.0. This value is below the fracture risk threshold.  The total bone mineral density in the proximal left femur measures 0.667 gm/cm2. The Z score is -1.9 and the T score is -2.3. This value is below the fracture risk threshold.  The femoral neck density is 0.532 gm/cm2, and the T score is -2.9. The other T scores range from -2.4 to -1.6.    No history of height loss, currently height 5 feet 1-1/2 inches  She has no history of low trauma fracture Mild L5 compression deformity noticed on MRI in 02/2018, unchanged from prior, she does not remember any symptoms with this.  ?  Menopause: She has had only 3 menstrual cycles in the last year, less since she had her coronary bypass surgery in 2019 Has not been evaluated by gynecologist for menopause  Previous treatment: None  Calcium supplements: 500 mg twice daily, using chewable calcium  Vitamin D supplements: None, a few years ago had been on supplements for low level, no recent levels  available     LABS:  Last calcium level on 10/12/2018 was 9.0 with albumin 3.1, previously 8.3  No results found for: VD25OH   Past Medical History:  Diagnosis Date  . Arthritis    right knee  . Ascites--mild this admit 11/06/2017   per patient , now stable with meds and lifestlye adjustment  . CAD (coronary artery disease)    a. s/p CABGx2 in 02/2017 (LAD not suitable for PCI), EF normal.  . Familial hyperlipidemia   . GERD (gastroesophageal reflux disease)   . IBS (irritable bowel syndrome)   . PONV (postoperative nausea and vomiting)    i always throw up on waking up , but last EGD in march had no issues    . Primary biliary cirrhosis (HCC)    cirhosis/liver disease followed by transplant team led by Dr Manuella Ghazi at [facility]   . S/P CABG (coronary artery bypass graft)   . SVD (spontaneous vaginal delivery)    x 3  . Urinary tract bacterial infections     Past Surgical History:  Procedure Laterality Date  . CORONARY ARTERY BYPASS GRAFT N/A 03/24/2017   Procedure: CORONARY ARTERY BYPASS GRAFTING (CABG) x two, using left internal mammary artery, left radial artery, and right leg greater saphenous vein harvested endoscopically;  Surgeon: Ivin Poot, MD;  Location: [place];  Service: Open Heart Surgery;  Laterality: N/A;  . ENDOVEIN HARVEST OF GREATER SAPHENOUS VEIN Right 03/24/2017   Procedure: ENDOVEIN HARVEST OF GREATER SAPHENOUS  VEIN;  Surgeon: Ivin Poot, MD;  Location: [place];  Service: Open Heart Surgery;  Laterality: Right;  . ESOPHAGEAL BANDING  02/01/2018   Procedure: ESOPHAGEAL BANDING;  Surgeon: [name], MD;  Location: [facility];  Service: Gastroenterology;;  . ESOPHAGEAL BANDING N/A 03/29/2018   Procedure: ESOPHAGEAL BANDING;  Surgeon: [name], MD;  [facility];  Service: Gastroenterology;  Laterality: N/A;  . ESOPHAGEAL BANDING N/A 05/21/2018   Procedure: ESOPHAGEAL BANDING;  Surgeon: [name], MD;  [facility];  Service:  Gastroenterology;  Laterality: N/A;  . ESOPHAGEAL BANDING N/A 10/11/2018   Procedure: ESOPHAGEAL BANDING;  Surgeon: [name], MD;  [facility];  Service: Gastroenterology;  Laterality: N/A;  . ESOPHAGOGASTRODUODENOSCOPY N/A 03/29/2018   Procedure: ESOPHAGOGASTRODUODENOSCOPY (EGD);  Surgeon: [name], MD;  Location: [facility];  Service: Gastroenterology;  Laterality: N/A;  . ESOPHAGOGASTRODUODENOSCOPY N/A 05/21/2018   Procedure: ESOPHAGOGASTRODUODENOSCOPY (EGD);  Surgeon: [name], MD;  Location: [facility];  Service: Gastroenterology;  Laterality: N/A;  . ESOPHAGOGASTRODUODENOSCOPY (EGD) WITH PROPOFOL N/A 02/01/2018   Procedure: ESOPHAGOGASTRODUODENOSCOPY (EGD) WITH PROPOFOL;  Surgeon: [name], MD;  [facility];  Service: Gastroenterology;  Laterality: N/A;  . ESOPHAGOGASTRODUODENOSCOPY (EGD) WITH PROPOFOL N/A 10/11/2018   Procedure: ESOPHAGOGASTRODUODENOSCOPY (EGD) WITH PROPOFOL;  Surgeon: [name], MD;  [facility];  Service: Gastroenterology;  Laterality: N/A;  . INCONTINENCE SURGERY    . LEEP N/A 03/28/2014   Procedure: LOOP ELECTROSURGICAL EXCISION PROCEDURE (LEEP) cone biopsy;  Surgeon: Cyril Mourning, MD;  Location: [facility];  Service: Gynecology;  Laterality: N/A;  . LEFT HEART CATH AND CORONARY ANGIOGRAPHY N/A 03/23/2017   Procedure: LEFT HEART CATH AND CORONARY ANGIOGRAPHY;  Surgeon: Jettie Booze, MD;  Location: [place] [facility];  Service: Cardiovascular;  Laterality: N/A;  . LIVER BIOPSY     x 2  . RADIAL ARTERY HARVEST Left 03/24/2017   Procedure: RADIAL ARTERY HARVEST;  Surgeon: Ivin Poot, MD;  Location: [place];  Service: Open Heart Surgery;  Laterality: Left;  . TEE WITHOUT CARDIOVERSION N/A 03/24/2017   Procedure: TRANSESOPHAGEAL ECHOCARDIOGRAM (TEE);  Surgeon: Prescott Gum, Collier Salina, MD;  Location: [place];  Service: Open Heart Surgery;  Laterality: N/A;  . WISDOM TOOTH EXTRACTION      Family History  Problem Relation Age  of Onset  . CAD Father   . Diabetes Mellitus II Father   . Heart disease Father   . Heart attack Brother   . Heart disease Maternal Aunt   . Heart attack Paternal Grandmother   . Heart attack Paternal Grandfather     Social History:  reports that she has never smoked. She has never used smokeless tobacco. She reports previous alcohol use of about 1.0 standard drinks of alcohol per week. She reports that she does not use drugs.  Allergies:  Allergies  Allergen Reactions  . Codeine Nausea And Vomiting  . Erythromycin Nausea And Vomiting  . Fentanyl Nausea And Vomiting    Allergies as of 01/17/2019      Reactions   Codeine Nausea And Vomiting   Erythromycin Nausea And Vomiting   Fentanyl Nausea And Vomiting      Medication List       Accurate as of January 16, 2019  8:52 PM. If you have any questions, ask your nurse or doctor.        Alirocumab 150 MG/ML Soaj Commonly known as: Praluent Inject 1 Dose into the skin every 14 (fourteen) days.   aspirin 81 MG EC  tablet Take 81 mg by mouth daily. Swallow whole.   calcium carbonate 500 MG chewable tablet Commonly known as: TUMS - dosed in mg elemental calcium Chew 2 tablets by mouth daily.   DEKAS PLUS PO Take 1 capsule by mouth daily.   furosemide 20 MG tablet Commonly known as: LASIX Take 1 tablet (20 mg total) by mouth daily.   hydrOXYzine 25 MG tablet Commonly known as: ATARAX/VISTARIL Take 25 mg by mouth at bedtime as needed for itching.   imiquimod 5 % cream Commonly known as: ALDARA Apply 1 application topically 3 (three) times a week.   Melatonin 3 MG Caps Take 6 mg by mouth at bedtime as needed (for sleep).   metoprolol tartrate 25 MG tablet Commonly known as: LOPRESSOR Take 0.5 tablets (12.5 mg total) by mouth 2 (two) times daily.   metroNIDAZOLE 0.75 % cream Commonly known as: METROCREAM Apply 1 application topically 2 (two) times daily.   nitroGLYCERIN 0.4 MG SL tablet Commonly known as:  NITROSTAT Place 1 tablet (0.4 mg total) under the tongue every 5 (five) minutes as needed for chest pain.   PROBIOTIC PO Take 1 capsule by mouth daily.   spironolactone 50 MG tablet Commonly known as: ALDACTONE Take 50 mg by mouth daily.   sucralfate 1 GM/10ML suspension Commonly known as: Carafate Take 10 mLs (1 g total) by mouth 4 (four) times daily.   triamcinolone cream 0.1 % Commonly known as: KENALOG Apply 1 application topically 2 (two) times daily.   ursodiol 300 MG capsule Commonly known as: ACTIGALL Take 300 mg by mouth 3 (three) times daily.        Review of Systems She has had primary biliary cirrhosis since 2006 and is a candidate for liver transplant followed by [facility]     PHYSICAL EXAM:  There were no vitals taken for this visit.    ASSESSMENT:   OSTEOPOROSIS with lowest T score -3.0  Osteoporosis likely related to her severe liver disease This is currently asymptomatic although she does have an old mild L5 compression deformity on MRI scan since 2019 at least No recent vitamin D levels available With normal calcium levels usually and no history of kidney stones unlikely as he has hyperparathyroidism  She does need to start treatment because of the presence of osteoporosis despite being only perimenopausal and potential for further bone loss with her liver disease  Fatigue: Although likely to be from liver disease she has not had any evaluation of thyroid function from current records  Also not clear if she is in early menopause with her recent history of oligomenorrhea       PLAN:    Check vitamin D level  Discussed that treatment options are usually initially bisphosphonates for her level of osteoporosis but since she has had upper gastrointestinal issues including variceal bleeding in the past would not be a candidate for a trial of oral bisphosphonates  If her vitamin D level is reasonably good she can be scheduled for Reclast infusion   She can continue her calcium supplements unchanged  Also will check [place], TSH  Once results are available patient will be sent to a my chart message for further actions  Consultation note sent to referring physician   Elayne Snare 01/16/2019, 8:52 PM   Total visit time for evaluation and management of review of extensive medical records, patient interview, coordination of care, evaluation of multiple problems and counseling = 40 minutes

## 2019-01-17 ENCOUNTER — Ambulatory Visit: Payer: 59 | Admitting: Endocrinology

## 2019-01-17 DIAGNOSIS — K429 Umbilical hernia without obstruction or gangrene: Secondary | ICD-10-CM | POA: Diagnosis not present

## 2019-01-17 MED FILL — hydrOXYzine HCL 25 MG TABS: 25 | 90 days supply | Qty: 360 | Fill #0

## 2019-01-19 DIAGNOSIS — K429 Umbilical hernia without obstruction or gangrene: Secondary | ICD-10-CM | POA: Diagnosis not present

## 2019-01-19 DIAGNOSIS — K743 Primary biliary cirrhosis: Secondary | ICD-10-CM | POA: Diagnosis not present

## 2019-01-24 DIAGNOSIS — K7290 Hepatic failure, unspecified without coma: Secondary | ICD-10-CM | POA: Diagnosis not present

## 2019-01-26 ENCOUNTER — Emergency Department (HOSPITAL_COMMUNITY): Payer: 59

## 2019-01-26 ENCOUNTER — Emergency Department (HOSPITAL_COMMUNITY)
Admission: EM | Admit: 2019-01-26 | Discharge: 2019-01-26 | Disposition: A | Discharge status: Home / Self Care | Payer: 59 | Attending: Emergency Medicine | Admitting: Emergency Medicine

## 2019-01-26 ENCOUNTER — Other Ambulatory Visit: Payer: Self-pay

## 2019-01-26 ENCOUNTER — Encounter (HOSPITAL_COMMUNITY): Payer: Self-pay | Admitting: Emergency Medicine

## 2019-01-26 DIAGNOSIS — I2510 Atherosclerotic heart disease of native coronary artery without angina pectoris: Secondary | ICD-10-CM | POA: Insufficient documentation

## 2019-01-26 DIAGNOSIS — R112 Nausea with vomiting, unspecified: Secondary | ICD-10-CM | POA: Diagnosis not present

## 2019-01-26 DIAGNOSIS — R188 Other ascites: Secondary | ICD-10-CM | POA: Diagnosis not present

## 2019-01-26 DIAGNOSIS — R1030 Lower abdominal pain, unspecified: Secondary | ICD-10-CM | POA: Diagnosis present

## 2019-01-26 DIAGNOSIS — Z951 Presence of aortocoronary bypass graft: Secondary | ICD-10-CM | POA: Insufficient documentation

## 2019-01-26 DIAGNOSIS — K743 Primary biliary cirrhosis: Secondary | ICD-10-CM | POA: Insufficient documentation

## 2019-01-26 DIAGNOSIS — R109 Unspecified abdominal pain: Secondary | ICD-10-CM

## 2019-01-26 DIAGNOSIS — Z79899 Other long term (current) drug therapy: Secondary | ICD-10-CM | POA: Insufficient documentation

## 2019-01-26 DIAGNOSIS — Z7982 Long term (current) use of aspirin: Secondary | ICD-10-CM | POA: Diagnosis not present

## 2019-01-26 LAB — COMPREHENSIVE METABOLIC PANEL
ALT: 43 U/L (ref 0–44)
AST: 72 U/L — ABNORMAL HIGH (ref 15–41)
Albumin: 2.4 g/dL — ABNORMAL LOW (ref 3.5–5.0)
Alkaline Phosphatase: 245 U/L — ABNORMAL HIGH (ref 38–126)
Anion gap: 10 (ref 5–15)
BUN: 9 mg/dL (ref 6–20)
CO2: 20 mmol/L — ABNORMAL LOW (ref 22–32)
Calcium: 8.7 mg/dL — ABNORMAL LOW (ref 8.9–10.3)
Chloride: 101 mmol/L (ref 98–111)
Creatinine, Ser: 0.5 mg/dL (ref 0.44–1.00)
GFR calc Af Amer: >60 mL/min (ref >60)
GFR calc non Af Amer: >60 mL/min (ref >60)
Glucose, Bld: 100 mg/dL — ABNORMAL HIGH (ref 70–99)
Potassium: 4.1 mmol/L (ref 3.5–5.1)
Sodium: 131 mmol/L — ABNORMAL LOW (ref 135–145)
Total Bilirubin: 8.6 mg/dL — ABNORMAL HIGH (ref 0.3–1.2)
Total Protein: 6.8 g/dL (ref 6.5–8.1)

## 2019-01-26 LAB — GRAM STAIN

## 2019-01-26 LAB — CBC WITH DIFFERENTIAL/PLATELET
Abs Immature Granulocytes: 0.03 10*3/uL (ref 0.00–0.07)
Basophils Absolute: 0.1 10*3/uL (ref 0.0–0.1)
Basophils Relative: 1 %
Eosinophils Absolute: 0 10*3/uL (ref 0.0–0.5)
Eosinophils Relative: 1 %
HCT: 32.4 % — ABNORMAL LOW (ref 36.0–46.0)
Hemoglobin: 10.8 g/dL — ABNORMAL LOW (ref 12.0–15.0)
Immature Granulocytes: 0 %
Lymphocytes Relative: 9 %
Lymphs Abs: 0.7 10*3/uL (ref 0.7–4.0)
MCH: 30.5 pg (ref 26.0–34.0)
MCHC: 33.3 g/dL (ref 30.0–36.0)
MCV: 91.5 fL (ref 80.0–100.0)
Monocytes Absolute: 0.8 10*3/uL (ref 0.1–1.0)
Monocytes Relative: 11 %
Neutro Abs: 5.6 10*3/uL (ref 1.7–7.7)
Neutrophils Relative %: 78 %
Platelets: 146 10*3/uL — ABNORMAL LOW (ref 150–400)
RBC: 3.54 MIL/uL — ABNORMAL LOW (ref 3.87–5.11)
RDW: 18.6 % — ABNORMAL HIGH (ref 11.5–15.5)
WBC: 7.2 10*3/uL (ref 4.0–10.5)
nRBC: 0 % (ref 0.0–0.2)

## 2019-01-26 LAB — BODY FLUID CELL COUNT WITH DIFFERENTIAL
Lymphs, Fluid: 15 %
Monocyte-Macrophage-Serous Fluid: 69 % (ref 50–90)
Neutrophil Count, Fluid: 16 % (ref 0–25)
Total Nucleated Cell Count, Fluid: 295 cu mm (ref 0–1000)

## 2019-01-26 LAB — I-STAT BETA HCG BLOOD, ED (MC, WL, AP ONLY): I-stat hCG, quantitative: <5 m[IU]/mL (ref <5)

## 2019-01-26 LAB — PROTIME-INR
INR: 1.2 (ref 0.8–1.2)
Prothrombin Time: 15.2 seconds (ref 11.4–15.2)

## 2019-01-26 LAB — LIPASE, BLOOD: Lipase: 43 U/L (ref 11–51)

## 2019-01-26 MED ORDER — ONDANSETRON 4 MG PO TBDP: 4.0000 mg | ORAL_TABLET | Freq: Three times a day (TID) | ORAL | 0 refills | Status: DC | PRN

## 2019-01-26 MED ORDER — IOHEXOL 300 MG/ML  SOLN
100.0000 mL | Freq: Once | INTRAMUSCULAR | Status: AC | PRN
  Administered 2019-01-26: 09:00:00 100 mL via INTRAVENOUS

## 2019-01-26 MED ORDER — SODIUM CHLORIDE (PF) 0.9 % IJ SOLN
INTRAMUSCULAR | Status: AC
  Filled 2019-01-26: qty 50

## 2019-01-26 MED ORDER — ONDANSETRON HCL 4 MG/2ML IJ SOLN
4.0000 mg | Freq: Once | INTRAMUSCULAR | Status: AC
  Administered 2019-01-26: 09:00:00 4 mg via INTRAVENOUS
  Filled 2019-01-26: qty 2

## 2019-01-26 MED ORDER — HYDROMORPHONE HCL 1 MG/ML IJ SOLN
0.5000 mg | Freq: Once | INTRAMUSCULAR | Status: AC
  Administered 2019-01-26: 0.5 mg via INTRAVENOUS
  Filled 2019-01-26: qty 1

## 2019-01-26 MED ORDER — PROMETHAZINE HCL 25 MG/ML IJ SOLN
12.5000 mg | Freq: Once | INTRAMUSCULAR | Status: AC
  Administered 2019-01-26: 12.5 mg via INTRAVENOUS
  Filled 2019-01-26: qty 1

## 2019-01-26 MED ORDER — LIDOCAINE HCL 1 % IJ SOLN
INTRAMUSCULAR | Status: AC
  Filled 2019-01-26: qty 20

## 2019-01-26 MED FILL — ONDANSETRON ODT 4 MG TABLET: 4 | 7 days supply | Qty: 20 | Fill #0

## 2019-01-26 NOTE — ED Provider Notes (Signed)
[facility] DEPT Provider Note   CSN: [id number] Arrival date & time: 01/26/19  [id number]     History   Chief Complaint Chief Complaint  Patient presents with  . Abdominal Pain  . abdominal distention    HPI Christine Butler is a 48 y.o. female.  Past medical history coronary artery disease, primary biliary cirrhosis presents to ER with chief complaint of abdominal pain.  Woke up this morning having severe abdominal pain, lower abdomen, left and right side, has been associated with some nausea and dry heaving but no vomiting.  States she has no swelling noticed increasing abdominal distention.  No associated fevers.  Has been evaluated by [facility] for transplant, but not on transplant list.     HPI  Past Medical History:  Diagnosis Date  . Arthritis    right knee  . Ascites--mild this admit 11/06/2017   per patient , now stable with meds and lifestlye adjustment  . CAD (coronary artery disease)    a. s/p CABGx2 in 02/2017 (LAD not suitable for PCI), EF normal.  . Familial hyperlipidemia   . GERD (gastroesophageal reflux disease)   . IBS (irritable bowel syndrome)   . PONV (postoperative nausea and vomiting)    i always throw up on waking up , but last EGD in march had no issues    . Primary biliary cirrhosis (HCC)    cirhosis/liver disease followed by transplant team led by Dr Manuella Ghazi at [facility]   . S/P CABG (coronary artery bypass graft)   . SVD (spontaneous vaginal delivery)    x 3  . Urinary tract bacterial infections     Patient Active Problem List   Diagnosis Date Noted  . Upper GI bleed 02/01/2018  . GI bleed 02/01/2018  . Ascites--mild this admit 11/06/2017  . Atypical chest pain 11/05/2017  . Dyslipidemia, goal LDL below 70 10/13/2017  . Acute blood loss anemia 04/20/2017  . Elevated LFTs 04/20/2017  . Coronary artery disease 03/24/2017  . S/P CABG x 2 03/24/2017  . CAD (coronary artery disease), native coronary artery 03/23/2017  .  Biliary cirrhosis ([place]) 03/16/2017  . GERD (gastroesophageal reflux disease) 01/28/2012  . Primary biliary cirrhosis ([place]) 01/28/2012  . IBS (irritable bowel syndrome) 01/28/2012    Past Surgical History:  Procedure Laterality Date  . CORONARY ARTERY BYPASS GRAFT N/A 03/24/2017   Procedure: CORONARY ARTERY BYPASS GRAFTING (CABG) x two, using left internal mammary artery, left radial artery, and right leg greater saphenous vein harvested endoscopically;  Surgeon: Ivin Poot, MD;  Location: [place];  Service: Open Heart Surgery;  Laterality: N/A;  . ENDOVEIN HARVEST OF GREATER SAPHENOUS VEIN Right 03/24/2017   Procedure: ENDOVEIN HARVEST OF GREATER SAPHENOUS VEIN;  Surgeon: Ivin Poot, MD;  Location: [place];  Service: Open Heart Surgery;  Laterality: Right;  . ESOPHAGEAL BANDING  02/01/2018   Procedure: ESOPHAGEAL BANDING;  Surgeon: [name], MD;  Location: [facility];  Service: Gastroenterology;;  . ESOPHAGEAL BANDING N/A 03/29/2018   Procedure: ESOPHAGEAL BANDING;  Surgeon: [name], MD;  [facility];  Service: Gastroenterology;  Laterality: N/A;  . ESOPHAGEAL BANDING N/A 05/21/2018   Procedure: ESOPHAGEAL BANDING;  Surgeon: [name], MD;  [facility];  Service: Gastroenterology;  Laterality: N/A;  . ESOPHAGEAL BANDING N/A 10/11/2018   Procedure: ESOPHAGEAL BANDING;  Surgeon: [name], MD;  [facility];  Service: Gastroenterology;  Laterality: N/A;  . ESOPHAGOGASTRODUODENOSCOPY N/A 03/29/2018   Procedure: ESOPHAGOGASTRODUODENOSCOPY (EGD);  Surgeon:  [name], MD;  Location: [facility];  Service: Gastroenterology;  Laterality: N/A;  . ESOPHAGOGASTRODUODENOSCOPY N/A 05/21/2018   Procedure: ESOPHAGOGASTRODUODENOSCOPY (EGD);  Surgeon: [name], MD;  Location: [facility];  Service: Gastroenterology;  Laterality: N/A;  . ESOPHAGOGASTRODUODENOSCOPY (EGD) WITH PROPOFOL N/A 02/01/2018   Procedure: ESOPHAGOGASTRODUODENOSCOPY (EGD) WITH PROPOFOL;   Surgeon: [name], MD;  [facility];  Service: Gastroenterology;  Laterality: N/A;  . ESOPHAGOGASTRODUODENOSCOPY (EGD) WITH PROPOFOL N/A 10/11/2018   Procedure: ESOPHAGOGASTRODUODENOSCOPY (EGD) WITH PROPOFOL;  Surgeon: [name], MD;  [facility];  Service: Gastroenterology;  Laterality: N/A;  . INCONTINENCE SURGERY    . LEEP N/A 03/28/2014   Procedure: LOOP ELECTROSURGICAL EXCISION PROCEDURE (LEEP) cone biopsy;  Surgeon: Cyril Mourning, MD;  Location: [facility];  Service: Gynecology;  Laterality: N/A;  . LEFT HEART CATH AND CORONARY ANGIOGRAPHY N/A 03/23/2017   Procedure: LEFT HEART CATH AND CORONARY ANGIOGRAPHY;  Surgeon: Jettie Booze, MD;  Location: [facility];  Service: Cardiovascular;  Laterality: N/A;  . LIVER BIOPSY     x 2  . RADIAL ARTERY HARVEST Left 03/24/2017   Procedure: RADIAL ARTERY HARVEST;  Surgeon: Ivin Poot, MD;  Location: [facility];  Service: Open Heart Surgery;  Laterality: Left;  . TEE WITHOUT CARDIOVERSION N/A 03/24/2017   Procedure: TRANSESOPHAGEAL ECHOCARDIOGRAM (TEE);  Surgeon: Prescott Gum, Collier Salina, MD;  Location: [place];  Service: Open Heart Surgery;  Laterality: N/A;  . WISDOM TOOTH EXTRACTION       OB History    Gravida  3   Para  3   Term      Preterm      AB      Living        SAB      TAB      Ectopic      Multiple      Live Births           Obstetric Comments  None         Home Medications    Prior to Admission medications   Medication Sig Start Date End Date Taking? Authorizing Provider  Alirocumab (PRALUENT) 150 MG/ML SOAJ Inject 1 Dose into the skin every 14 (fourteen) days. 01/08/18   Pixie Casino, MD  aspirin 81 MG EC tablet Take 81 mg by mouth daily. Swallow whole.    [provider]  calcium carbonate (TUMS - DOSED IN MG ELEMENTAL CALCIUM) 500 MG chewable tablet Chew 2 tablets by mouth daily.    [provider]  furosemide (LASIX) 20 MG tablet Take 1 tablet (20 mg  total) by mouth daily. 11/06/17   Isaiah Serge, NP  hydrOXYzine (ATARAX/VISTARIL) 25 MG tablet Take 25 mg by mouth at bedtime as needed for itching.     [provider]  imiquimod (ALDARA) 5 % cream Apply 1 application topically 3 (three) times a week.    [provider]  Melatonin 3 MG CAPS Take 6 mg by mouth at bedtime as needed (for sleep).    [provider]  metoprolol tartrate (LOPRESSOR) 25 MG tablet Take 0.5 tablets (12.5 mg total) by mouth 2 (two) times daily. 09/20/18   Revankar, Reita Cliche, MD  metroNIDAZOLE (METROCREAM) 0.75 % cream Apply 1 application topically 2 (two) times daily.    [provider]  Multiple Vitamins-Minerals (DEKAS PLUS PO) Take 1 capsule by mouth daily.    [provider]  nitroGLYCERIN (NITROSTAT) 0.4 MG SL tablet Place 1 tablet (0.4 mg  total) under the tongue every 5 (five) minutes as needed for chest pain. 05/12/17 05/10/19  Ward, Ozella Almond, PA-C  Probiotic Product (PROBIOTIC PO) Take 1 capsule by mouth daily.     [provider]  spironolactone (ALDACTONE) 50 MG tablet Take 50 mg by mouth daily.  08/24/17   [provider]  sucralfate (CARAFATE) 1 GM/10ML suspension Take 10 mLs (1 g total) by mouth 4 (four) times daily. 05/21/18 06/04/19  [name], MD  triamcinolone cream (KENALOG) 0.1 % Apply 1 application topically 2 (two) times daily.    [provider]  ursodiol (ACTIGALL) 300 MG capsule Take 300 mg by mouth 3 (three) times daily.     [provider]    Family History Family History  Problem Relation Age of Onset  . CAD Father   . Diabetes Mellitus II Father   . Heart disease Father   . Heart attack Brother   . Heart disease Maternal Aunt   . Heart attack Paternal Grandmother   . Heart attack Paternal Grandfather     Social History Social History   Tobacco Use  . Smoking status: Never Smoker  . Smokeless tobacco: Never Used  Substance Use Topics  . Alcohol use:  Not Currently    Alcohol/week: 1.0 standard drinks    Types: 1 Glasses of wine per week    Comment: for dinner  . Drug use: No     Allergies   Codeine, Erythromycin, and Fentanyl   Review of Systems Review of Systems  Constitutional: Negative for chills and fever.  HENT: Negative for ear pain and sore throat.   Eyes: Negative for pain and visual disturbance.  Respiratory: Negative for cough and shortness of breath.   Cardiovascular: Negative for chest pain and palpitations.  Gastrointestinal: Positive for abdominal pain and nausea. Negative for vomiting.  Genitourinary: Negative for dysuria and hematuria.  Musculoskeletal: Negative for arthralgias and back pain.  Skin: Negative for color change and rash.  Neurological: Negative for seizures and syncope.  All other systems reviewed and are negative.    Physical Exam Updated Vital Signs BP 119/77 (BP Location: Left Arm)   Pulse 90   Temp 98 F (36.7 C)   Resp 17   SpO2 100%   Physical Exam Vitals signs and nursing note reviewed.  Constitutional:      General: She is not in acute distress.    Appearance: She is well-developed.  HENT:     Head: Normocephalic and atraumatic.  Eyes:     General: Scleral icterus present.     Conjunctiva/sclera: Conjunctivae normal.     Pupils: Pupils are equal, round, and reactive to light.  Neck:     Musculoskeletal: Neck supple.  Cardiovascular:     Rate and Rhythm: Normal rate and regular rhythm.     Heart sounds: No murmur.  Pulmonary:     Effort: Pulmonary effort is normal. No respiratory distress.     Breath sounds: Normal breath sounds.  Abdominal:     Palpations: Abdomen is soft.     Comments: Mild distension, TTP across lower abdomen, no rebound or guarding  Skin:    General: Skin is warm and dry.     Capillary Refill: Capillary refill takes less than 2 seconds.  Neurological:     General: No focal deficit present.     Mental Status: She is alert and oriented to  person, place, and time.  Psychiatric:        Mood and  Affect: Mood normal.        Behavior: Behavior normal.       ED Treatments / Results  Labs (all labs ordered are listed, but only abnormal results are displayed) Labs Reviewed  CBC WITH DIFFERENTIAL/PLATELET - Abnormal; Notable for the following components:      Result Value   RBC 3.54 (*)    Hemoglobin 10.8 (*)    HCT 32.4 (*)    RDW 18.6 (*)    Platelets 146 (*)    All other components within normal limits  LIPASE, BLOOD  COMPREHENSIVE METABOLIC PANEL  URINALYSIS, ROUTINE W REFLEX MICROSCOPIC  PROTIME-INR  I-STAT BETA HCG BLOOD, ED ([facility], [facility], [facility] ONLY)    EKG None  Radiology No results found.  Procedures Procedures (including critical care time)  Medications Ordered in ED Medications - No data to display   Initial Impression / Assessment and Plan / ED Course  I have reviewed the triage vital signs and the nursing notes.  Pertinent labs & imaging results that were available during my care of the patient were reviewed by me and considered in my medical decision making (see chart for details).  Clinical Course as of Jan 25 1554  Wed Jan 26, 2019  1006 Reviewed labs and imaging, will discuss with gastroenterology   [RD]  1147 Discussed with [facility], [facility], recommends diagnostic para to r/o SBP; if SBP w/u neg, then can be dc'd if symptoms controlled   [RD]  1151 Updated patient, going to IR now, had recurrent emesis, will give additional antiemetics   [RD]  1427 Neutrophil Count, Fluid: 16 [RD]  1438 Rechecked, symptoms resolved, no more vomiting, tolerating PO, will dc home with close GI f/u out pt   [RD]    Clinical Course User Index [RD] Lucrezia Starch, MD       48 year old lady primary biliary cirrhosis presents to ER with new onset abdominal pain, nausea and vomiting.  Noted some generalized tenderness palpation, mild distention.  Work-up demonstrated chronic LFT elevation but no acute changes.   No elevation WBCs.  CT scan negative for acute process but did demonstrate ascites.  Reviewed case with her gastroenterology team, discussed with Dr. Paulita Fujita.  IR assisted with diagnostic paracentesis which was negative for SBP.  On reassessment, patient symptoms and pain well controlled, tolerating p.o. without difficulty.  GI felt likely okay for discharge if tap was negative.  Given a forementioned work-up and reassessment, believe patient is appropriate for discharge and outpatient management this time.  Recommend close recheck with GI.    After the discussed management above, the patient was determined to be safe for discharge.  The patient was in agreement with this plan and all questions regarding their care were answered.  ED return precautions were discussed and the patient will return to the ED with any significant worsening of condition.    Final Clinical Impressions(s) / ED Diagnoses   Final diagnoses:  Abdominal pain  Primary biliary cirrhosis (HCC)  Non-intractable vomiting with nausea    ED Discharge Orders    None       Lucrezia Starch, MD 01/26/19 1557

## 2019-01-26 NOTE — Procedures (Signed)
Ultrasound-guided diagnostic and therapeutic paracentesis performed yielding 1.7 liters of yellow fluid. No immediate complications.  A portion of the fluid was submitted to the lab for preordered studies. EBL< 1 cc.

## 2019-01-26 NOTE — ED Notes (Signed)
Patient transported to Ultrasound 

## 2019-01-26 NOTE — Discharge Instructions (Signed)
Please call your GI office to schedule an appointment to be seen within 24-48 hours. If you develop worsening pain, vomiting, fevers or other new concerning symptom, please return to ER for reassessment.

## 2019-01-26 NOTE — ED Triage Notes (Signed)
abd pains with nausea, dry heaves, abd distention started this morning. Has liver failure and hx ascites that is usually controlled with diuretics. [place] and waiting to get on transplant list. Has abd hernia that wont be removed by any doctors due to her high risk. Had one episode of diarrhea. Reports that knows her bilirubin is elevated.

## 2019-01-27 DIAGNOSIS — K7290 Hepatic failure, unspecified without coma: Principal | ICD-10-CM

## 2019-01-27 LAB — PATHOLOGIST SMEAR REVIEW

## 2019-01-27 MED FILL — SPIRONOLACTONE 100 MG TAB: 100 | 90 days supply | Qty: 90 | Fill #1

## 2019-01-27 MED FILL — FUROSEMIDE 40 MG TAB: 40 | 90 days supply | Qty: 90 | Fill #1

## 2019-01-31 ENCOUNTER — Ambulatory Visit
Admit: 2019-01-31 | Discharge: 2019-01-31 | Payer: PRIVATE HEALTH INSURANCE | Attending: Internal Medicine | Primary: Internal Medicine

## 2019-01-31 ENCOUNTER — Ambulatory Visit: Admit: 2019-01-31 | Discharge: 2019-01-31 | Payer: PRIVATE HEALTH INSURANCE

## 2019-01-31 ENCOUNTER — Ambulatory Visit: Admit: 2019-01-31 | Discharge: 2019-02-01 | Payer: PRIVATE HEALTH INSURANCE

## 2019-01-31 DIAGNOSIS — K743 Primary biliary cirrhosis: Principal | ICD-10-CM

## 2019-01-31 DIAGNOSIS — K7290 Hepatic failure, unspecified without coma: Principal | ICD-10-CM

## 2019-01-31 DIAGNOSIS — I25729 Atherosclerosis of autologous artery coronary artery bypass graft(s) with unspecified angina pectoris: Principal | ICD-10-CM

## 2019-01-31 DIAGNOSIS — K8309 Other cholangitis: Principal | ICD-10-CM

## 2019-01-31 DIAGNOSIS — Z7982 Long term (current) use of aspirin: Secondary | ICD-10-CM | POA: Diagnosis not present

## 2019-01-31 DIAGNOSIS — C539 Malignant neoplasm of cervix uteri, unspecified: Secondary | ICD-10-CM | POA: Diagnosis not present

## 2019-01-31 DIAGNOSIS — B029 Zoster without complications: Secondary | ICD-10-CM | POA: Diagnosis not present

## 2019-01-31 DIAGNOSIS — B1910 Unspecified viral hepatitis B without hepatic coma: Secondary | ICD-10-CM | POA: Diagnosis not present

## 2019-01-31 DIAGNOSIS — I8501 Esophageal varices with bleeding: Secondary | ICD-10-CM | POA: Diagnosis not present

## 2019-01-31 DIAGNOSIS — Z885 Allergy status to narcotic agent status: Secondary | ICD-10-CM | POA: Diagnosis not present

## 2019-01-31 DIAGNOSIS — E8809 Other disorders of plasma-protein metabolism, not elsewhere classified: Secondary | ICD-10-CM | POA: Diagnosis not present

## 2019-01-31 DIAGNOSIS — I2510 Atherosclerotic heart disease of native coronary artery without angina pectoris: Secondary | ICD-10-CM | POA: Diagnosis not present

## 2019-01-31 DIAGNOSIS — C189 Malignant neoplasm of colon, unspecified: Secondary | ICD-10-CM | POA: Diagnosis not present

## 2019-01-31 LAB — CULTURE, BODY FLUID W GRAM STAIN -BOTTLE: Culture: NO GROWTH

## 2019-01-31 MED ORDER — LEVOFLOXACIN 750 MG TABLET
ORAL_TABLET | Freq: Every day | ORAL | 0 refills | 5.00000 days | Status: CP
Start: 2019-01-31 — End: ?

## 2019-01-31 MED FILL — levoFLOXacin 750 MG TABS: 750 | 5 days supply | Qty: 5 | Fill #0

## 2019-01-31 NOTE — Telephone Encounter (Signed)
Called [employer] and medication has been approved until 01/06/2020. Asked for fax of approval letter to be sent

## 2019-02-07 ENCOUNTER — Ambulatory Visit: Admit: 2019-02-07 | Discharge: 2019-02-08 | Payer: PRIVATE HEALTH INSURANCE

## 2019-02-07 DIAGNOSIS — I340 Nonrheumatic mitral (valve) insufficiency: Secondary | ICD-10-CM | POA: Diagnosis not present

## 2019-02-07 DIAGNOSIS — R011 Cardiac murmur, unspecified: Secondary | ICD-10-CM | POA: Diagnosis not present

## 2019-02-07 DIAGNOSIS — I25729 Atherosclerosis of autologous artery coronary artery bypass graft(s) with unspecified angina pectoris: Secondary | ICD-10-CM | POA: Diagnosis not present

## 2019-02-07 DIAGNOSIS — I517 Cardiomegaly: Secondary | ICD-10-CM | POA: Diagnosis not present

## 2019-02-08 DIAGNOSIS — K7290 Hepatic failure, unspecified without coma: Secondary | ICD-10-CM | POA: Diagnosis not present

## 2019-02-10 MED FILL — levoFLOXacin 750 MG TABS: 750 | 5 days supply | Qty: 5 | Fill #0

## 2019-02-11 ENCOUNTER — Other Ambulatory Visit: Payer: Self-pay | Admitting: Gastroenterology

## 2019-02-11 DIAGNOSIS — R1011 Right upper quadrant pain: Secondary | ICD-10-CM

## 2019-02-11 MED FILL — ONDANSETRON ODT 8 MG TABLET: 8 | 30 days supply | Qty: 120 | Fill #0

## 2019-02-14 ENCOUNTER — Telehealth: Payer: 59 | Admitting: Cardiology

## 2019-02-16 ENCOUNTER — Encounter: Payer: Self-pay | Admitting: Cardiology

## 2019-02-16 ENCOUNTER — Other Ambulatory Visit: Payer: Self-pay

## 2019-02-16 ENCOUNTER — Telehealth (INDEPENDENT_AMBULATORY_CARE_PROVIDER_SITE_OTHER): Payer: 59 | Admitting: Cardiology

## 2019-02-16 VITALS — Ht 62.0 in | Wt 138.0 lb

## 2019-02-16 DIAGNOSIS — R7989 Other specified abnormal findings of blood chemistry: Secondary | ICD-10-CM | POA: Diagnosis not present

## 2019-02-16 DIAGNOSIS — Z951 Presence of aortocoronary bypass graft: Secondary | ICD-10-CM | POA: Diagnosis not present

## 2019-02-16 DIAGNOSIS — K743 Primary biliary cirrhosis: Secondary | ICD-10-CM

## 2019-02-16 NOTE — Progress Notes (Signed)
Virtual Visit via Video Note   This visit type was conducted due to national recommendations for restrictions regarding the COVID-19 Pandemic (e.g. social distancing) in an effort to limit this patient's exposure and mitigate transmission in our community.  Due to her co-morbid illnesses, this patient is at least at moderate risk for complications without adequate follow up.  This format is felt to be most appropriate for this patient at this time.  All issues noted in this document were discussed and addressed.  A limited physical exam was performed with this format.  Please refer to the patient's chart for her consent to telehealth for [facility].   Date:  02/16/2019   ID:  Christine Butler, DOB March 12, 1970, MRN [id number]  Patient Location: Home Provider Location: Office  PCP:  Maude Leriche, PA-C  Cardiologist:  [name], MD  Electrophysiologist:  None   Evaluation Performed:  Follow-Up Visit  Chief Complaint: Coronary artery disease for follow-up  History of Present Illness:    Christine Butler is a 48 y.o. female with past medical history of coronary artery disease post CABG surgery, primary biliary cirrhosis and awaiting liver transplant.  She has history of elevated LFTs.  She has market dyslipidemia.  She denies any problems at this time and takes care of activities of daily living.  No chest pain orthopnea or PND.  At the time of my evaluation, the patient is alert awake oriented and in no distress.  The patient does not have symptoms concerning for COVID-19 infection (fever, chills, cough, or new shortness of breath).    Past Medical History:  Diagnosis Date  . Arthritis    right knee  . Ascites--mild this admit 11/06/2017   per patient , now stable with meds and lifestlye adjustment  . CAD (coronary artery disease)    a. s/p CABGx2 in 02/2017 (LAD not suitable for PCI), EF normal.  . Familial hyperlipidemia   . GERD (gastroesophageal reflux disease)   . IBS  (irritable bowel syndrome)   . PONV (postoperative nausea and vomiting)    i always throw up on waking up , but last EGD in march had no issues    . Primary biliary cirrhosis (HCC)    cirhosis/liver disease followed by transplant team led by Dr Manuella Ghazi at [facility]   . S/P CABG (coronary artery bypass graft)   . SVD (spontaneous vaginal delivery)    x 3  . Urinary tract bacterial infections    Past Surgical History:  Procedure Laterality Date  . CORONARY ARTERY BYPASS GRAFT N/A 03/24/2017   Procedure: CORONARY ARTERY BYPASS GRAFTING (CABG) x two, using left internal mammary artery, left radial artery, and right leg greater saphenous vein harvested endoscopically;  Surgeon: Ivin Poot, MD;  Location: [facility];  Service: Open Heart Surgery;  Laterality: N/A;  . ENDOVEIN HARVEST OF GREATER SAPHENOUS VEIN Right 03/24/2017   Procedure: ENDOVEIN HARVEST OF GREATER SAPHENOUS VEIN;  Surgeon: Ivin Poot, MD;  Location: [facility];  Service: Open Heart Surgery;  Laterality: Right;  . ESOPHAGEAL BANDING  02/01/2018   Procedure: ESOPHAGEAL BANDING;  Surgeon: [name], MD;  Location: [facility];  Service: Gastroenterology;;  . ESOPHAGEAL BANDING N/A 03/29/2018   Procedure: ESOPHAGEAL BANDING;  Surgeon: [name], MD;  [facility];  Service: Gastroenterology;  Laterality: N/A;  . ESOPHAGEAL BANDING N/A 05/21/2018   Procedure: ESOPHAGEAL BANDING;  Surgeon: [name], MD;  [facility];  Service: Gastroenterology;  Laterality: N/A;  .  ESOPHAGEAL BANDING N/A 10/11/2018   Procedure: ESOPHAGEAL BANDING;  Surgeon: [name], MD;  [facility];  Service: Gastroenterology;  Laterality: N/A;  . ESOPHAGOGASTRODUODENOSCOPY N/A 03/29/2018   Procedure: ESOPHAGOGASTRODUODENOSCOPY (EGD);  Surgeon: [name], MD;  Location: [facility];  Service: Gastroenterology;  Laterality: N/A;  . ESOPHAGOGASTRODUODENOSCOPY N/A 05/21/2018   Procedure: ESOPHAGOGASTRODUODENOSCOPY (EGD);  Surgeon:  [name], MD;  Location: [facility];  Service: Gastroenterology;  Laterality: N/A;  . ESOPHAGOGASTRODUODENOSCOPY (EGD) WITH PROPOFOL N/A 02/01/2018   Procedure: ESOPHAGOGASTRODUODENOSCOPY (EGD) WITH PROPOFOL;  Surgeon: [name], MD;  [facility];  Service: Gastroenterology;  Laterality: N/A;  . ESOPHAGOGASTRODUODENOSCOPY (EGD) WITH PROPOFOL N/A 10/11/2018   Procedure: ESOPHAGOGASTRODUODENOSCOPY (EGD) WITH PROPOFOL;  Surgeon: [name], MD;  [facility];  Service: Gastroenterology;  Laterality: N/A;  . INCONTINENCE SURGERY    . LEEP N/A 03/28/2014   Procedure: LOOP ELECTROSURGICAL EXCISION PROCEDURE (LEEP) cone biopsy;  Surgeon: Cyril Mourning, MD;  Location: [facility];  Service: Gynecology;  Laterality: N/A;  . LEFT HEART CATH AND CORONARY ANGIOGRAPHY N/A 03/23/2017   Procedure: LEFT HEART CATH AND CORONARY ANGIOGRAPHY;  Surgeon: Jettie Booze, MD;  Location: [facility] [place] [facility];  Service: Cardiovascular;  Laterality: N/A;  . LIVER BIOPSY     x 2  . RADIAL ARTERY HARVEST Left 03/24/2017   Procedure: RADIAL ARTERY HARVEST;  Surgeon: Ivin Poot, MD;  Location: [facility];  Service: Open Heart Surgery;  Laterality: Left;  . TEE WITHOUT CARDIOVERSION N/A 03/24/2017   Procedure: TRANSESOPHAGEAL ECHOCARDIOGRAM (TEE);  Surgeon: Prescott Gum, Collier Salina, MD;  Location: [facility];  Service: Open Heart Surgery;  Laterality: N/A;  . WISDOM TOOTH EXTRACTION       Current Meds  Medication Sig  . Alirocumab (PRALUENT) 150 MG/ML SOAJ Inject 1 Dose into the skin every 14 (fourteen) days.  Marland Kitchen aspirin 81 MG EC tablet Take 81 mg by mouth daily. Swallow whole.  . calcium carbonate (TUMS - DOSED IN MG ELEMENTAL CALCIUM) 500 MG chewable tablet Chew 2 tablets by mouth daily.  . Calcium Citrate-Vitamin D 200-125 MG-UNIT TABS Take 1 tablet by mouth 2 (two) times daily.  . furosemide (LASIX) 20 MG tablet Take 1 tablet (20 mg total) by mouth daily. (Patient taking differently: Take 40 mg by mouth  daily. )  . hydrOXYzine (ATARAX/VISTARIL) 25 MG tablet Take 25 mg by mouth at bedtime as needed for itching.   . Melatonin 3 MG CAPS Take 6 mg by mouth at bedtime as needed (for sleep).  . metoprolol tartrate (LOPRESSOR) 25 MG tablet Take 0.5 tablets (12.5 mg total) by mouth 2 (two) times daily.  . metroNIDAZOLE (METROCREAM) 0.75 % cream Apply 1 application topically 2 (two) times daily.  . Multiple Vitamins-Minerals (DEKAS PLUS PO) Take 1 capsule by mouth daily.  . nitroGLYCERIN (NITROSTAT) 0.4 MG SL tablet Place 1 tablet (0.4 mg total) under the tongue every 5 (five) minutes as needed for chest pain.  Marland Kitchen ondansetron (ZOFRAN ODT) 4 MG disintegrating tablet Take 1 tablet (4 mg total) by mouth every 8 (eight) hours as needed for nausea or vomiting.  . Probiotic Product (PROBIOTIC PO) Take 1 capsule by mouth daily.   Marland Kitchen spironolactone (ALDACTONE) 50 MG tablet Take 100 mg by mouth daily.   Marland Kitchen triamcinolone cream (KENALOG) 0.1 % Apply 1 application topically 2 (two) times daily.  . ursodiol (ACTIGALL) 300 MG capsule Take 300 mg by mouth 3 (three) times daily.   . [DISCONTINUED] sucralfate (CARAFATE) 1 GM/10ML suspension Take 10  mLs (1 g total) by mouth 4 (four) times daily.     Allergies:   Codeine, Erythromycin, and Fentanyl   Social History   Tobacco Use  . Smoking status: Never Smoker  . Smokeless tobacco: Never Used  Substance Use Topics  . Alcohol use: Not Currently    Alcohol/week: 1.0 standard drinks    Types: 1 Glasses of wine per week    Comment: for dinner  . Drug use: No     Family Hx: The patient's family history includes CAD in her father; Diabetes Mellitus II in her father; Heart attack in her brother, paternal grandfather, and paternal grandmother; Heart disease in her father and maternal aunt.  ROS:   Please see the history of present illness.    As mentioned above All other systems reviewed and are negative.   Prior CV studies:   The following studies were reviewed  today:  Previous evaluation discussed with her at length  Labs/Other Tests and Data Reviewed:    EKG:  EKG done in prior visit was reviewed  Recent Labs: 01/26/2019: ALT 43; BUN 9; Creatinine, Ser 0.50; Hemoglobin 10.8; Platelets 146; Potassium 4.1; Sodium 131   Recent Lipid Panel Lab Results  Component Value Date/Time   CHOL 249 (H) 01/10/2019 09:08 AM   TRIG 124 01/10/2019 09:08 AM   HDL 15 (L) 01/10/2019 09:08 AM   CHOLHDL 16.6 (H) 01/10/2019 09:08 AM   CHOLHDL NOT CALCULATED 12/18/2017 07:20 AM   LDLCALC 211 (H) 01/10/2019 09:08 AM   LDLDIRECT 229 (H) 12/30/2017 08:56 AM    Wt Readings from Last 3 Encounters:  02/16/19 138 lb (62.6 kg)  01/07/19 139 lb (63 kg)  10/11/18 134 lb 14.7 oz (61.2 kg)     Objective:    Vital Signs:  Ht 5\' 2"  (1.575 m)   Wt 138 lb (62.6 kg)   BMI 25.24 kg/m    VITAL SIGNS:  reviewed  ASSESSMENT & PLAN:    1. Coronary artery disease: Secondary prevention stressed with the patient.  Importance of compliance with diet and medication stressed and she vocalized understanding she is limited in her ambulation because of her generalized fatigue.  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 2. Essential hypertension: Blood pressure stable 3. Mixed dyslipidemia: LFTs are elevated therefore we are limited in therapy.  Our lipid allergist has told her that transplant will help her lipid issues.  She is awaiting liver transplant for liver cirrhosis. 4. Patient will be seen in follow-up appointment in 6 months or earlier if the patient has any concerns   COVID-19 Education: The signs and symptoms of COVID-19 were discussed with the patient and how to seek care for testing (follow up with PCP or arrange E-visit).  The importance of social distancing was discussed today.  Time:   Today, I have spent 15 minutes with the patient with telehealth technology discussing the above problems.     Medication Adjustments/Labs and Tests Ordered: Current  medicines are reviewed at length with the patient today.  Concerns regarding medicines are outlined above.   Tests Ordered: No orders of the defined types were placed in this encounter.   Medication Changes: No orders of the defined types were placed in this encounter.   Follow Up:  Either In Person or Virtual in 6 month(s)  Signed, [name], MD  02/16/2019 4:34 PM    [facility]

## 2019-02-16 NOTE — Patient Instructions (Signed)

## 2019-02-21 ENCOUNTER — Encounter: Payer: Self-pay | Admitting: Endocrinology

## 2019-02-21 ENCOUNTER — Ambulatory Visit (INDEPENDENT_AMBULATORY_CARE_PROVIDER_SITE_OTHER): Payer: 59 | Admitting: Endocrinology

## 2019-02-21 ENCOUNTER — Other Ambulatory Visit: Payer: Self-pay

## 2019-02-21 VITALS — BP 98/60 | HR 66 | Ht 62.0 in | Wt 135.0 lb

## 2019-02-21 DIAGNOSIS — K7290 Hepatic failure, unspecified without coma: Principal | ICD-10-CM

## 2019-02-21 DIAGNOSIS — M818 Other osteoporosis without current pathological fracture: Secondary | ICD-10-CM

## 2019-02-21 DIAGNOSIS — N914 Secondary oligomenorrhea: Secondary | ICD-10-CM

## 2019-02-21 DIAGNOSIS — R5383 Other fatigue: Secondary | ICD-10-CM | POA: Diagnosis not present

## 2019-02-21 DIAGNOSIS — E559 Vitamin D deficiency, unspecified: Secondary | ICD-10-CM | POA: Diagnosis not present

## 2019-02-21 NOTE — Progress Notes (Signed)
Patient ID: Christine Butler, female   DOB: [birth year], 48 y.o.   MRN: [id number]           Referring HCP: Maude Leriche PA   Chief complaint: Osteoporosis  History of Present Illness:   She had a screening bone density done in 02/2018 because of her chronic liver disease and since she is a candidate for liver transplant Results of the bone density were as follows:  Spine: The Z score is -2.4 and the T score is -3.0. This value is below the fracture risk threshold.  The total bone mineral density in the proximal left femur measures 0.667 gm/cm2. The Z score is -1.9 and the T score is -2.3. This value is below the fracture risk threshold.  The femoral neck density is 0.532 gm/cm2, and the T score is -2.9. The other T scores range from -2.4 to -1.6.    No history of height loss, currently height 5 feet 1-1/2 inches  She has no history of low trauma fracture Mild L5 compression deformity noticed on MRI in 02/2018, unchanged from prior, asymptomatic  ?  Menopause: She has had only 3 menstrual cycles in the last year but no hot flashes Has not had her labs checked as ordered  RECLAST treatment: 12/13/18 She did have mild body aches for couple of days with this  Calcium supplements: 500 mg twice daily, using chewable calcium  Vitamin D supplements: In multivitamin and also calcium supplement    LABS:  Last calcium level 8.7 with albumin 3.5 on 02/08/19  Baseline vitamin D level done at [facility]: On 11/29/2026 was 21.5  No results found for: VD25OH   Past Medical History:  Diagnosis Date  . Arthritis    right knee  . Ascites--mild this admit 11/06/2017   per patient , now stable with meds and lifestlye adjustment  . CAD (coronary artery disease)    a. s/p CABGx2 in 02/2017 (LAD not suitable for PCI), EF normal.  . Familial hyperlipidemia   . GERD (gastroesophageal reflux disease)   . IBS (irritable bowel syndrome)   . PONV (postoperative nausea and vomiting)    i always  throw up on waking up , but last EGD in march had no issues    . Primary biliary cirrhosis (HCC)    cirhosis/liver disease followed by transplant team led by Dr Manuella Ghazi at [facility]   . S/P CABG (coronary artery bypass graft)   . SVD (spontaneous vaginal delivery)    x 3  . Urinary tract bacterial infections     Past Surgical History:  Procedure Laterality Date  . CORONARY ARTERY BYPASS GRAFT N/A 03/24/2017   Procedure: CORONARY ARTERY BYPASS GRAFTING (CABG) x two, using left internal mammary artery, left radial artery, and right leg greater saphenous vein harvested endoscopically;  Surgeon: Ivin Poot, MD;  Location: [place];  Service: Open Heart Surgery;  Laterality: N/A;  . ENDOVEIN HARVEST OF GREATER SAPHENOUS VEIN Right 03/24/2017   Procedure: ENDOVEIN HARVEST OF GREATER SAPHENOUS VEIN;  Surgeon: Ivin Poot, MD;  Location: [place];  Service: Open Heart Surgery;  Laterality: Right;  . ESOPHAGEAL BANDING  02/01/2018   Procedure: ESOPHAGEAL BANDING;  Surgeon: [name], MD;  Location: [facility];  Service: Gastroenterology;;  . ESOPHAGEAL BANDING N/A 03/29/2018   Procedure: ESOPHAGEAL BANDING;  Surgeon: [name], MD;  Location: [facility];  Service: Gastroenterology;  Laterality: N/A;  . ESOPHAGEAL BANDING N/A 05/21/2018   Procedure: ESOPHAGEAL BANDING;  Surgeon: [name], MD;  Location: [facility];  Service: Gastroenterology;  Laterality: N/A;  . ESOPHAGEAL BANDING N/A 10/11/2018   Procedure: ESOPHAGEAL BANDING;  Surgeon: [name], MD;  Location: [facility];  Service: Gastroenterology;  Laterality: N/A;  . ESOPHAGOGASTRODUODENOSCOPY N/A 03/29/2018   Procedure: ESOPHAGOGASTRODUODENOSCOPY (EGD);  Surgeon: [name], MD;  Location: [facility];  Service: Gastroenterology;  Laterality: N/A;  . ESOPHAGOGASTRODUODENOSCOPY N/A 05/21/2018   Procedure: ESOPHAGOGASTRODUODENOSCOPY (EGD);  Surgeon: [name], MD;  Location: [facility];  Service: Gastroenterology;  Laterality:  N/A;  . ESOPHAGOGASTRODUODENOSCOPY (EGD) WITH PROPOFOL N/A 02/01/2018   Procedure: ESOPHAGOGASTRODUODENOSCOPY (EGD) WITH PROPOFOL;  Surgeon: [name], MD;  Location: [facility];  Service: Gastroenterology;  Laterality: N/A;  . ESOPHAGOGASTRODUODENOSCOPY (EGD) WITH PROPOFOL N/A 10/11/2018   Procedure: ESOPHAGOGASTRODUODENOSCOPY (EGD) WITH PROPOFOL;  Surgeon: [name], MD;  Location: [facility];  Service: Gastroenterology;  Laterality: N/A;  . INCONTINENCE SURGERY    . LEEP N/A 03/28/2014   Procedure: LOOP ELECTROSURGICAL EXCISION PROCEDURE (LEEP) cone biopsy;  Surgeon: Cyril Mourning, MD;  Location: [facility];  Service: Gynecology;  Laterality: N/A;  . LEFT HEART CATH AND CORONARY ANGIOGRAPHY N/A 03/23/2017   Procedure: LEFT HEART CATH AND CORONARY ANGIOGRAPHY;  Surgeon: Jettie Booze, MD;  Location: [facility];  Service: Cardiovascular;  Laterality: N/A;  . LIVER BIOPSY     x 2  . RADIAL ARTERY HARVEST Left 03/24/2017   Procedure: RADIAL ARTERY HARVEST;  Surgeon: Ivin Poot, MD;  Location: [place];  Service: Open Heart Surgery;  Laterality: Left;  . TEE WITHOUT CARDIOVERSION N/A 03/24/2017   Procedure: TRANSESOPHAGEAL ECHOCARDIOGRAM (TEE);  Surgeon: Prescott Gum, Collier Salina, MD;  Location: [place];  Service: Open Heart Surgery;  Laterality: N/A;  . WISDOM TOOTH EXTRACTION      Family History  Problem Relation Age of Onset  . CAD Father   . Diabetes Mellitus II Father   . Heart disease Father   . Heart attack Brother   . Heart disease Maternal Aunt   . Heart attack Paternal Grandmother   . Heart attack Paternal Grandfather     Social History:  reports that she has never smoked. She has never used smokeless tobacco. She reports previous alcohol use of about 1.0 standard drinks of alcohol per week. She reports that she does not use drugs.  Allergies:  Allergies  Allergen Reactions  . Codeine Nausea And Vomiting  . Erythromycin Nausea And Vomiting  . Fentanyl Nausea And  Vomiting    Allergies as of 02/21/2019      Reactions   Codeine Nausea And Vomiting   Erythromycin Nausea And Vomiting   Fentanyl Nausea And Vomiting      Medication List       Accurate as of February 21, 2019  3:07 PM. If you have any questions, ask your nurse or doctor.        STOP taking these medications   imiquimod 5 % cream Commonly known as: ALDARA Stopped by: Elayne Snare, MD     TAKE these medications   Alirocumab 150 MG/ML Soaj Commonly known as: Praluent Inject 1 Dose into the skin every 14 (fourteen) days.   aspirin 81 MG EC tablet Take 81 mg by mouth daily. Swallow whole.   calcium carbonate 500 MG chewable tablet Commonly known as: TUMS - dosed in mg elemental calcium Chew 2 tablets by mouth daily.   Calcium Citrate-Vitamin D 200-125 MG-UNIT Tabs Take 1 tablet by mouth 2 (two) times daily.   DEKAS PLUS PO Take 1  capsule by mouth daily.   furosemide 20 MG tablet Commonly known as: LASIX Take 1 tablet (20 mg total) by mouth daily. What changed: how much to take   hydrOXYzine 25 MG tablet Commonly known as: ATARAX/VISTARIL Take 25 mg by mouth at bedtime as needed for itching.   Melatonin 3 MG Caps Take 6 mg by mouth at bedtime as needed (for sleep).   metoprolol tartrate 25 MG tablet Commonly known as: LOPRESSOR Take 0.5 tablets (12.5 mg total) by mouth 2 (two) times daily.   metroNIDAZOLE 0.75 % cream Commonly known as: METROCREAM Apply 1 application topically 2 (two) times daily.   nitroGLYCERIN 0.4 MG SL tablet Commonly known as: NITROSTAT Place 1 tablet (0.4 mg total) under the tongue every 5 (five) minutes as needed for chest pain.   ondansetron 4 MG disintegrating tablet Commonly known as: Zofran ODT Take 1 tablet (4 mg total) by mouth every 8 (eight) hours as needed for nausea or vomiting.   PROBIOTIC PO Take 1 capsule by mouth daily.   spironolactone 50 MG tablet Commonly known as: ALDACTONE Take 100 mg by mouth daily.    triamcinolone cream 0.1 % Commonly known as: KENALOG Apply 1 application topically 2 (two) times daily.   ursodiol 300 MG capsule Commonly known as: ACTIGALL Take 300 mg by mouth 3 (three) times daily.        Review of Systems   She has had primary biliary cirrhosis since 2006 and is a candidate for liver transplant followed by [facility]  No results found for: TSH    PHYSICAL EXAM:  BP 98/60 (BP Location: Left Arm, Patient Position: Sitting, Cuff Size: Normal)   Pulse 66   Ht 5\' 2"  (1.575 m)   Wt 135 lb (61.2 kg)   SpO2 99%   BMI 24.69 kg/m     ASSESSMENT:   OSTEOPOROSIS with lowest T score -3.0  Osteoporosis likely related to her severe liver disease She does have an old mild L5 compression deformity on MRI scan since 2019 at least  Has mildly decreased vitamin D level of 21 and has been started on supplementation However no hypercalcemia or hypocalcemia recently Also may be in menopause but this needs to be confirmed  She has received Reclast and likely will need to do this annually  Fatigue: Although likely to be from liver disease she has not had any evaluation of thyroid function even though this was ordered      PLAN:    Check vitamin D level, thyroid levels, FSH.  Labs will be drawn with her other lab work at [facility] since she is a hard stick  If confirmed that she is in menopause likely she will need to HRT because of her young age and osteoporosis and she can discuss this with her gynecologist at upcoming visit  We will see her back before her next Reclast infusion in 10/21    Elayne Snare 02/21/2019, 3:07 PM

## 2019-02-22 ENCOUNTER — Ambulatory Visit
Admission: RE | Admit: 2019-02-22 | Discharge: 2019-02-22 | Disposition: A | Discharge status: Home / Self Care | Payer: 59 | Source: Ambulatory Visit | Attending: Gastroenterology | Admitting: Gastroenterology

## 2019-02-22 DIAGNOSIS — K7290 Hepatic failure, unspecified without coma: Principal | ICD-10-CM

## 2019-02-22 DIAGNOSIS — Z7682 Awaiting organ transplant status: Principal | ICD-10-CM

## 2019-02-22 DIAGNOSIS — R1011 Right upper quadrant pain: Secondary | ICD-10-CM | POA: Diagnosis not present

## 2019-02-22 DIAGNOSIS — R188 Other ascites: Secondary | ICD-10-CM | POA: Diagnosis not present

## 2019-03-03 ENCOUNTER — Ambulatory Visit: Admit: 2019-03-03 | Discharge: 2019-03-04 | Payer: PRIVATE HEALTH INSURANCE

## 2019-03-03 DIAGNOSIS — K7290 Hepatic failure, unspecified without coma: Principal | ICD-10-CM

## 2019-03-03 DIAGNOSIS — Z01818 Encounter for other preprocedural examination: Principal | ICD-10-CM

## 2019-03-03 DIAGNOSIS — Z7682 Awaiting organ transplant status: Principal | ICD-10-CM

## 2019-03-03 DIAGNOSIS — K743 Primary biliary cirrhosis: Principal | ICD-10-CM

## 2019-03-03 MED ORDER — FUROSEMIDE 40 MG TABLET
ORAL_TABLET | Freq: Every day | ORAL | 2 refills | 30 days | Status: CP
Start: 2019-03-03 — End: 2019-06-01

## 2019-03-03 MED ORDER — SPIRONOLACTONE 100 MG TABLET
ORAL_TABLET | Freq: Every day | ORAL | 2 refills | 30 days | Status: CP
Start: 2019-03-03 — End: 2019-06-01

## 2019-03-03 MED ORDER — CIPROFLOXACIN 500 MG TABLET
ORAL_TABLET | Freq: Every day | ORAL | 2 refills | 30.00000 days | Status: CP
Start: 2019-03-03 — End: 2019-06-01

## 2019-03-03 MED FILL — SPIRONOLACTONE 100 MG TAB: 100 | 30 days supply | Qty: 45 | Fill #0

## 2019-03-03 MED FILL — FUROSEMIDE 40 MG TAB: 40 | 30 days supply | Qty: 45 | Fill #0

## 2019-03-03 MED FILL — CIPROFLOXACIN HCL 500 MG TA: 500 | 30 days supply | Qty: 30 | Fill #0

## 2019-03-04 ENCOUNTER — Other Ambulatory Visit (HOSPITAL_COMMUNITY): Payer: Self-pay | Admitting: *Deleted

## 2019-03-07 ENCOUNTER — Other Ambulatory Visit: Payer: Self-pay | Admitting: Gastroenterology

## 2019-03-07 ENCOUNTER — Other Ambulatory Visit: Payer: Self-pay

## 2019-03-07 ENCOUNTER — Encounter (HOSPITAL_COMMUNITY)
Admission: RE | Admit: 2019-03-07 | Discharge: 2019-03-07 | Disposition: A | Discharge status: Home / Self Care | Payer: 59 | Source: Ambulatory Visit | Attending: Transplant Hepatology | Admitting: Transplant Hepatology

## 2019-03-07 DIAGNOSIS — K7290 Hepatic failure, unspecified without coma: Secondary | ICD-10-CM | POA: Insufficient documentation

## 2019-03-07 DIAGNOSIS — K7460 Unspecified cirrhosis of liver: Secondary | ICD-10-CM | POA: Insufficient documentation

## 2019-03-07 MED ORDER — ALBUMIN HUMAN 25 % IV SOLN
50.0000 g | INTRAVENOUS | Status: DC
  Administered 2019-03-07: 50 g via INTRAVENOUS
  Filled 2019-03-07: qty 200

## 2019-03-07 MED ORDER — FUROSEMIDE 10 MG/ML IJ SOLN
INTRAMUSCULAR | Status: AC
  Administered 2019-03-07: 40 mg via INTRAVENOUS
  Filled 2019-03-07: qty 4

## 2019-03-07 MED ORDER — FUROSEMIDE 10 MG/ML IJ SOLN: 40.0000 mg | INTRAMUSCULAR | Status: DC

## 2019-03-08 DIAGNOSIS — L814 Other melanin hyperpigmentation: Secondary | ICD-10-CM | POA: Diagnosis not present

## 2019-03-08 DIAGNOSIS — D1801 Hemangioma of skin and subcutaneous tissue: Secondary | ICD-10-CM | POA: Diagnosis not present

## 2019-03-08 DIAGNOSIS — L851 Acquired keratosis [keratoderma] palmaris et plantaris: Secondary | ICD-10-CM | POA: Diagnosis not present

## 2019-03-08 DIAGNOSIS — Z23 Encounter for immunization: Secondary | ICD-10-CM | POA: Diagnosis not present

## 2019-03-08 DIAGNOSIS — L813 Cafe au lait spots: Secondary | ICD-10-CM | POA: Diagnosis not present

## 2019-03-08 DIAGNOSIS — L304 Erythema intertrigo: Secondary | ICD-10-CM | POA: Diagnosis not present

## 2019-03-08 DIAGNOSIS — K130 Diseases of lips: Secondary | ICD-10-CM | POA: Diagnosis not present

## 2019-03-08 DIAGNOSIS — L578 Other skin changes due to chronic exposure to nonionizing radiation: Secondary | ICD-10-CM | POA: Diagnosis not present

## 2019-03-08 DIAGNOSIS — L570 Actinic keratosis: Secondary | ICD-10-CM | POA: Diagnosis not present

## 2019-03-08 MED FILL — HYDROCORTISONE 2.5% OINT: 2.5 | 14 days supply | Qty: 57 | Fill #0

## 2019-03-14 ENCOUNTER — Inpatient Hospital Stay (HOSPITAL_COMMUNITY): Admission: RE | Admit: 2019-03-14 | Payer: 59 | Source: Ambulatory Visit

## 2019-03-21 ENCOUNTER — Other Ambulatory Visit: Payer: Self-pay

## 2019-03-21 ENCOUNTER — Encounter (HOSPITAL_COMMUNITY)
Admission: RE | Admit: 2019-03-21 | Discharge: 2019-03-21 | Disposition: A | Discharge status: Home / Self Care | Payer: 59 | Source: Ambulatory Visit | Attending: Transplant Hepatology | Admitting: Transplant Hepatology

## 2019-03-21 DIAGNOSIS — K7290 Hepatic failure, unspecified without coma: Principal | ICD-10-CM

## 2019-03-21 DIAGNOSIS — K7460 Unspecified cirrhosis of liver: Secondary | ICD-10-CM | POA: Diagnosis not present

## 2019-03-21 MED ORDER — ALBUMIN HUMAN 25 % IV SOLN
50.0000 g | INTRAVENOUS | Status: DC
  Administered 2019-03-21: 50 g via INTRAVENOUS
  Filled 2019-03-21: qty 200

## 2019-03-21 MED ORDER — FUROSEMIDE 10 MG/ML IJ SOLN
INTRAMUSCULAR | Status: AC
  Administered 2019-03-21: 40 mg
  Filled 2019-03-21: qty 4

## 2019-03-21 MED ORDER — FUROSEMIDE 10 MG/ML IJ SOLN: 40.0000 mg | INTRAMUSCULAR | Status: DC

## 2019-03-28 ENCOUNTER — Inpatient Hospital Stay (HOSPITAL_COMMUNITY): Admission: RE | Admit: 2019-03-28 | Payer: 59 | Source: Ambulatory Visit

## 2019-03-29 DIAGNOSIS — K7290 Hepatic failure, unspecified without coma: Secondary | ICD-10-CM | POA: Diagnosis not present

## 2019-03-30 ENCOUNTER — Encounter
Admit: 2019-03-30 | Discharge: 2019-04-02 | Disposition: A | Discharge status: Home / Self Care | Payer: PRIVATE HEALTH INSURANCE | Attending: Nurse Practitioner

## 2019-03-30 ENCOUNTER — Ambulatory Visit
Admit: 2019-03-30 | Discharge: 2019-04-02 | Disposition: A | Discharge status: Home / Self Care | Payer: PRIVATE HEALTH INSURANCE

## 2019-03-30 ENCOUNTER — Encounter
Admit: 2019-03-30 | Discharge: 2019-04-02 | Disposition: A | Discharge status: Home / Self Care | Payer: PRIVATE HEALTH INSURANCE

## 2019-03-30 DIAGNOSIS — G479 Sleep disorder, unspecified: Secondary | ICD-10-CM | POA: Diagnosis not present

## 2019-03-30 DIAGNOSIS — I2510 Atherosclerotic heart disease of native coronary artery without angina pectoris: Secondary | ICD-10-CM | POA: Diagnosis not present

## 2019-03-30 DIAGNOSIS — I8510 Secondary esophageal varices without bleeding: Secondary | ICD-10-CM | POA: Diagnosis not present

## 2019-03-30 DIAGNOSIS — K3189 Other diseases of stomach and duodenum: Secondary | ICD-10-CM | POA: Diagnosis not present

## 2019-03-30 DIAGNOSIS — K743 Primary biliary cirrhosis: Secondary | ICD-10-CM | POA: Diagnosis not present

## 2019-03-30 DIAGNOSIS — I509 Heart failure, unspecified: Secondary | ICD-10-CM | POA: Diagnosis not present

## 2019-03-30 DIAGNOSIS — I8500 Esophageal varices without bleeding: Secondary | ICD-10-CM | POA: Diagnosis not present

## 2019-03-30 DIAGNOSIS — K8301 Primary sclerosing cholangitis: Secondary | ICD-10-CM | POA: Diagnosis not present

## 2019-03-30 DIAGNOSIS — R410 Disorientation, unspecified: Secondary | ICD-10-CM | POA: Diagnosis not present

## 2019-03-30 DIAGNOSIS — E7849 Other hyperlipidemia: Secondary | ICD-10-CM | POA: Insufficient documentation

## 2019-03-30 DIAGNOSIS — E871 Hypo-osmolality and hyponatremia: Secondary | ICD-10-CM | POA: Insufficient documentation

## 2019-03-30 DIAGNOSIS — K7460 Unspecified cirrhosis of liver: Secondary | ICD-10-CM | POA: Diagnosis not present

## 2019-03-30 DIAGNOSIS — Z944 Liver transplant status: Secondary | ICD-10-CM | POA: Insufficient documentation

## 2019-03-30 DIAGNOSIS — K7469 Other cirrhosis of liver: Secondary | ICD-10-CM | POA: Diagnosis not present

## 2019-03-30 DIAGNOSIS — K766 Portal hypertension: Secondary | ICD-10-CM | POA: Diagnosis not present

## 2019-03-30 DIAGNOSIS — R7401 Elevation of levels of liver transaminase levels: Secondary | ICD-10-CM | POA: Diagnosis not present

## 2019-03-30 DIAGNOSIS — Z7982 Long term (current) use of aspirin: Secondary | ICD-10-CM | POA: Diagnosis not present

## 2019-03-30 DIAGNOSIS — Z20822 Contact with and (suspected) exposure to covid-19: Secondary | ICD-10-CM | POA: Diagnosis not present

## 2019-03-30 DIAGNOSIS — K5900 Constipation, unspecified: Secondary | ICD-10-CM | POA: Diagnosis not present

## 2019-03-30 DIAGNOSIS — Z01818 Encounter for other preprocedural examination: Secondary | ICD-10-CM | POA: Diagnosis not present

## 2019-03-30 DIAGNOSIS — Z951 Presence of aortocoronary bypass graft: Secondary | ICD-10-CM

## 2019-03-30 DIAGNOSIS — F419 Anxiety disorder, unspecified: Secondary | ICD-10-CM | POA: Diagnosis not present

## 2019-03-30 DIAGNOSIS — K920 Hematemesis: Secondary | ICD-10-CM | POA: Diagnosis not present

## 2019-03-30 DIAGNOSIS — Z9889 Other specified postprocedural states: Secondary | ICD-10-CM | POA: Insufficient documentation

## 2019-03-30 HISTORY — DX: Liver transplant status: Z94.4

## 2019-03-30 HISTORY — DX: Hypo-osmolality and hyponatremia: E87.1

## 2019-03-30 HISTORY — DX: Other specified postprocedural states: Z98.890

## 2019-03-30 HISTORY — DX: Presence of aortocoronary bypass graft: Z95.1

## 2019-03-31 ENCOUNTER — Institutional Professional Consult (permissible substitution): Admit: 2019-03-31 | Discharge: 2019-04-01 | Payer: PRIVATE HEALTH INSURANCE

## 2019-03-31 ENCOUNTER — Institutional Professional Consult (permissible substitution)
Admit: 2019-03-31 | Discharge: 2019-04-01 | Payer: PRIVATE HEALTH INSURANCE | Attending: Psychologist | Primary: Psychologist

## 2019-03-31 DIAGNOSIS — Z01818 Encounter for other preprocedural examination: Secondary | ICD-10-CM | POA: Diagnosis not present

## 2019-03-31 DIAGNOSIS — K743 Primary biliary cirrhosis: Secondary | ICD-10-CM | POA: Diagnosis not present

## 2019-03-31 DIAGNOSIS — F419 Anxiety disorder, unspecified: Secondary | ICD-10-CM | POA: Diagnosis not present

## 2019-03-31 DIAGNOSIS — G479 Sleep disorder, unspecified: Secondary | ICD-10-CM | POA: Diagnosis not present

## 2019-04-02 DIAGNOSIS — I8500 Esophageal varices without bleeding: Principal | ICD-10-CM

## 2019-04-02 MED ORDER — LACTULOSE 20 GRAM/30 ML ORAL SOLUTION
Freq: Three times a day (TID) | ORAL | 0 refills | 30.00000 days | Status: CP
Start: 2019-04-02 — End: 2019-05-02

## 2019-04-02 MED FILL — LACTULOSE 10 GM/15 ML SOLUT: 10 | 30 days supply | Qty: 4050 | Fill #0

## 2019-04-04 ENCOUNTER — Other Ambulatory Visit: Payer: Self-pay

## 2019-04-04 ENCOUNTER — Encounter (HOSPITAL_COMMUNITY)
Admission: RE | Admit: 2019-04-04 | Discharge: 2019-04-04 | Disposition: A | Discharge status: Home / Self Care | Payer: 59 | Source: Ambulatory Visit | Attending: Transplant Hepatology | Admitting: Transplant Hepatology

## 2019-04-04 DIAGNOSIS — K7460 Unspecified cirrhosis of liver: Secondary | ICD-10-CM | POA: Diagnosis not present

## 2019-04-04 DIAGNOSIS — K7290 Hepatic failure, unspecified without coma: Secondary | ICD-10-CM | POA: Insufficient documentation

## 2019-04-04 MED ORDER — FUROSEMIDE 10 MG/ML IJ SOLN: 40.0000 mg | INTRAMUSCULAR | Status: DC

## 2019-04-04 MED ORDER — ALBUMIN HUMAN 25 % IV SOLN
50.0000 g | INTRAVENOUS | Status: DC
  Filled 2019-04-04: qty 200

## 2019-04-04 MED ORDER — FUROSEMIDE 10 MG/ML IJ SOLN
INTRAMUSCULAR | Status: AC
  Filled 2019-04-04: qty 4

## 2019-04-04 NOTE — Progress Notes (Signed)
At bedside to start PIV.  Patient assessed both visually and with ultrasound.  Patient with very limited options due to small veins and bruising from recent hospitalization.  Patient has site that would be appropriate for midline but patient would like to find out if a PICC could be placed instead due to risk to damage the veins used for a PICC.  Spoke with Ebony Hail, RN regarding above.  Ebony Hail contacting MD at this time.

## 2019-04-05 NOTE — Progress Notes (Signed)
RN contacted patient to complete endo call . Patient informed RN that she is canceling EGD as she just had it done at [facility] and had already emailed Dr Encarnacion Slates office to make them aware but they have not responded.

## 2019-04-06 ENCOUNTER — Other Ambulatory Visit (HOSPITAL_COMMUNITY): Payer: Self-pay | Admitting: Gastroenterology

## 2019-04-06 DIAGNOSIS — K7460 Unspecified cirrhosis of liver: Secondary | ICD-10-CM

## 2019-04-06 DIAGNOSIS — Z789 Other specified health status: Secondary | ICD-10-CM

## 2019-04-07 ENCOUNTER — Other Ambulatory Visit: Payer: Self-pay | Admitting: Radiology

## 2019-04-07 ENCOUNTER — Other Ambulatory Visit: Payer: Self-pay | Admitting: *Deleted

## 2019-04-07 ENCOUNTER — Ambulatory Visit: Admit: 2019-04-07 | Discharge: 2019-04-07 | Payer: PRIVATE HEALTH INSURANCE

## 2019-04-07 ENCOUNTER — Ambulatory Visit: Admit: 2019-04-07 | Discharge: 2019-04-08 | Payer: PRIVATE HEALTH INSURANCE

## 2019-04-07 DIAGNOSIS — K7290 Hepatic failure, unspecified without coma: Principal | ICD-10-CM

## 2019-04-07 DIAGNOSIS — Z7682 Awaiting organ transplant status: Principal | ICD-10-CM

## 2019-04-07 DIAGNOSIS — Z01810 Encounter for preprocedural cardiovascular examination: Secondary | ICD-10-CM | POA: Diagnosis not present

## 2019-04-07 DIAGNOSIS — K7460 Unspecified cirrhosis of liver: Secondary | ICD-10-CM | POA: Diagnosis not present

## 2019-04-07 DIAGNOSIS — Z01818 Encounter for other preprocedural examination: Secondary | ICD-10-CM | POA: Diagnosis not present

## 2019-04-07 DIAGNOSIS — M810 Age-related osteoporosis without current pathological fracture: Secondary | ICD-10-CM | POA: Diagnosis not present

## 2019-04-07 DIAGNOSIS — K766 Portal hypertension: Secondary | ICD-10-CM | POA: Diagnosis not present

## 2019-04-07 NOTE — Patient Outreach (Signed)
[facility]) Care Management  04/07/2019  Christine Butler 07-01-1970 [id number]  Transition of care telephone call  Referral received:04/07/19 Initial outreach:04/07/19 Insurance: [facility]   Initial unsuccessful telephone call to patient's preferred number in order to complete transition of care assessment; no answer, left HIPAA compliant voicemail message requesting return call.   Objective: Per the electronic medical record, Christine Butler   was hospitalized at [facility]  From 2/3-04/02/19   with/for Hyponatremia, ECG during admission grade 11 esphogeal varies, for repeat banding . Comorbidities include: PMHx of Cholestatic Cirrhosis, End stage liver disease ,Ascites, Listed for Liver transplant, CAD s/p CABG 03/24/17, hyperlipidemia . She was discharged to home on 04/02/19 without the need for home health services or durable medical equipment per the discharge summary.   Plan: This RNCM will route unsuccessful outreach letter with [facility] [employer] pamphlet and 24 hour Nurse Advice Line Magnet to [facility] clinical pool to be mailed to patient's home address. This RNCM will attempt another outreach within 4 business days.  [name], RN, BSN  [employer] Coordinator  [phone]- Mobile [phone]- Toll Free Main Office

## 2019-04-08 ENCOUNTER — Ambulatory Visit (HOSPITAL_COMMUNITY)
Admission: RE | Admit: 2019-04-08 | Discharge: 2019-04-08 | Disposition: A | Discharge status: Home / Self Care | Payer: 59 | Source: Ambulatory Visit | Attending: Gastroenterology | Admitting: Gastroenterology

## 2019-04-08 ENCOUNTER — Other Ambulatory Visit: Payer: Self-pay

## 2019-04-08 DIAGNOSIS — K589 Irritable bowel syndrome without diarrhea: Secondary | ICD-10-CM | POA: Diagnosis not present

## 2019-04-08 DIAGNOSIS — K7460 Unspecified cirrhosis of liver: Secondary | ICD-10-CM

## 2019-04-08 DIAGNOSIS — Z951 Presence of aortocoronary bypass graft: Secondary | ICD-10-CM | POA: Diagnosis not present

## 2019-04-08 DIAGNOSIS — Z7982 Long term (current) use of aspirin: Secondary | ICD-10-CM | POA: Insufficient documentation

## 2019-04-08 DIAGNOSIS — K743 Primary biliary cirrhosis: Secondary | ICD-10-CM | POA: Diagnosis not present

## 2019-04-08 DIAGNOSIS — K219 Gastro-esophageal reflux disease without esophagitis: Secondary | ICD-10-CM | POA: Insufficient documentation

## 2019-04-08 DIAGNOSIS — Z789 Other specified health status: Secondary | ICD-10-CM

## 2019-04-08 DIAGNOSIS — I2510 Atherosclerotic heart disease of native coronary artery without angina pectoris: Secondary | ICD-10-CM | POA: Insufficient documentation

## 2019-04-08 DIAGNOSIS — Z79899 Other long term (current) drug therapy: Secondary | ICD-10-CM | POA: Diagnosis not present

## 2019-04-08 DIAGNOSIS — Z452 Encounter for adjustment and management of vascular access device: Secondary | ICD-10-CM | POA: Diagnosis not present

## 2019-04-08 HISTORY — PX: IR IMAGING GUIDED PORT INSERTION: IMG5740

## 2019-04-08 LAB — PREGNANCY, URINE: Preg Test, Ur: NEGATIVE

## 2019-04-08 MED ORDER — SODIUM CHLORIDE 0.9 % IV SOLN: INTRAVENOUS | Status: DC

## 2019-04-08 MED ORDER — LIDOCAINE HCL 1 % IJ SOLN
INTRAMUSCULAR | Status: AC
  Filled 2019-04-08: qty 20

## 2019-04-08 MED ORDER — HYDROMORPHONE HCL 1 MG/ML IJ SOLN
INTRAMUSCULAR | Status: AC | PRN
  Administered 2019-04-08: 0.5 mg via INTRAVENOUS

## 2019-04-08 MED ORDER — CEFAZOLIN SODIUM-DEXTROSE 2-4 GM/100ML-% IV SOLN: 2.0000 g | INTRAVENOUS | Status: AC

## 2019-04-08 MED ORDER — ONDANSETRON HCL 4 MG/2ML IJ SOLN
INTRAMUSCULAR | Status: AC
  Administered 2019-04-08: 4 mg via INTRAVENOUS
  Filled 2019-04-08: qty 2

## 2019-04-08 MED ORDER — HYDROMORPHONE HCL 1 MG/ML IJ SOLN
INTRAMUSCULAR | Status: AC
  Filled 2019-04-08: qty 1

## 2019-04-08 MED ORDER — LIDOCAINE HCL 1 % IJ SOLN
INTRAMUSCULAR | Status: AC | PRN
  Administered 2019-04-08: 10 mL

## 2019-04-08 MED ORDER — SODIUM CHLORIDE 0.9 % IV SOLN
INTRAVENOUS | Status: AC | PRN
  Administered 2019-04-08: 10 mL/h via INTRAVENOUS

## 2019-04-08 MED ORDER — ONDANSETRON HCL 4 MG/2ML IJ SOLN: 4.0000 mg | Freq: Once | INTRAMUSCULAR | Status: AC

## 2019-04-08 MED ORDER — CEFAZOLIN SODIUM-DEXTROSE 2-4 GM/100ML-% IV SOLN
INTRAVENOUS | Status: AC
  Administered 2019-04-08: 2 g via INTRAVENOUS
  Filled 2019-04-08: qty 100

## 2019-04-08 MED ORDER — HEPARIN SOD (PORK) LOCK FLUSH 100 UNIT/ML IV SOLN
INTRAVENOUS | Status: AC
  Filled 2019-04-08: qty 5

## 2019-04-08 MED ORDER — MIDAZOLAM HCL 2 MG/2ML IJ SOLN
INTRAMUSCULAR | Status: AC
  Filled 2019-04-08: qty 2

## 2019-04-08 MED ORDER — MIDAZOLAM HCL 2 MG/2ML IJ SOLN
INTRAMUSCULAR | Status: AC | PRN
  Administered 2019-04-08 (×3): 0.5 mg via INTRAVENOUS

## 2019-04-08 NOTE — Procedures (Signed)
Interventional Radiology Procedure Note  Procedure: Single Lumen Power Port Placement    Access:  Right IJ vein.  Findings: Catheter tip positioned at SVC/RA junction. Port is ready for immediate use.   Complications: None  EBL: < 10 mL  Recommendations:  - Ok to shower in 24 hours - Do not submerge for 7 days - Routine line care   Macari Zalesky T. Lalla Laham, M.D Pager:  [phone]   

## 2019-04-08 NOTE — Discharge Instructions (Addendum)
Implanted Port Insertion, Care After This sheet gives you information about how to care for yourself after your procedure. Your health care provider may also give you more specific instructions. If you have problems or questions, contact your health care provider. What can I expect after the procedure? After the procedure, it is common to have:  Discomfort at the port insertion site.  Bruising on the skin over the port. This should improve over 3-4 days. Follow these instructions at home: Port care  After your port is placed, you will get a manufacturer's information card. The card has information about your port. Keep this card with you at all times.  Take care of the port as told by your health care provider. Ask your health care provider if you or a family member can get training for taking care of the port at home. A home health care nurse may also take care of the port.  Make sure to remember what type of port you have. Incision care      Follow instructions from your health care provider about how to take care of your port insertion site. Make sure you: ? Wash your hands with soap and water before and after you change your bandage (dressing). If soap and water are not available, use hand sanitizer. ? Change your dressing as told by your health care provider. ? Leave stitches (sutures), skin glue, or adhesive strips in place. These skin closures may need to stay in place for 2 weeks or longer. If adhesive strip edges start to loosen and curl up, you may trim the loose edges. Do not remove adhesive strips completely unless your health care provider tells you to do that.  Check your port insertion site every day for signs of infection. Check for: ? Redness, swelling, or pain. ? Fluid or blood. ? Warmth. ? Pus or a bad smell. Activity  Return to your normal activities as told by your health care provider. Ask your health care provider what activities are safe for you.  Do not  lift anything that is heavier than 10 lb (4.5 kg), or the limit that you are told, until your health care provider says that it is safe. General instructions  Take over-the-counter and prescription medicines only as told by your health care provider.  Do not take baths, swim, or use a hot tub until your health care provider approves. Ask your health care provider if you may take showers. You may only be allowed to take sponge baths.  Do not drive for 24 hours if you were given a sedative during your procedure.  Wear a medical alert bracelet in case of an emergency. This will tell any health care providers that you have a port.  Keep all follow-up visits as told by your health care provider. This is important. Contact a health care provider if:  You cannot flush your port with saline as directed, or you cannot draw blood from the port.  You have a fever or chills.  You have redness, swelling, or pain around your port insertion site.  You have fluid or blood coming from your port insertion site.  Your port insertion site feels warm to the touch.  You have pus or a bad smell coming from the port insertion site. Get help right away if:  You have chest pain or shortness of breath.  You have bleeding from your port that you cannot control. Summary  Take care of the port as told by your health   care provider. Keep the manufacturer's information card with you at all times.  Change your dressing as told by your health care provider.  Contact a health care provider if you have a fever or chills or if you have redness, swelling, or pain around your port insertion site.  Keep all follow-up visits as told by your health care provider. This information is not intended to replace advice given to you by your health care provider. Make sure you discuss any questions you have with your health care provider. Document Revised: 09/08/2017 Document Reviewed: 09/08/2017 Elsevier Patient Education   2020 Elsevier Inc. Moderate Conscious Sedation, Adult Sedation is the use of medicines to promote relaxation and relieve discomfort and anxiety. Moderate conscious sedation is a type of sedation. Under moderate conscious sedation, you are less alert than normal, but you are still able to respond to instructions, touch, or both. Moderate conscious sedation is used during short medical and dental procedures. It is milder than deep sedation, which is a type of sedation under which you cannot be easily woken up. It is also milder than general anesthesia, which is the use of medicines to make you unconscious. Moderate conscious sedation allows you to return to your regular activities sooner. Tell a health care provider about:  Any allergies you have.  All medicines you are taking, including vitamins, herbs, eye drops, creams, and over-the-counter medicines.  Use of steroids (by mouth or creams).  Any problems you or family members have had with sedatives and anesthetic medicines.  Any blood disorders you have.  Any surgeries you have had.  Any medical conditions you have, such as sleep apnea.  Whether you are pregnant or may be pregnant.  Any use of cigarettes, alcohol, marijuana, or street drugs. What are the risks? Generally, this is a safe procedure. However, problems may occur, including:  Getting too much medicine (oversedation).  Nausea.  Allergic reaction to medicines.  Trouble breathing. If this happens, a breathing tube may be used to help with breathing. It will be removed when you are awake and breathing on your own.  Heart trouble.  Lung trouble. What happens before the procedure? Staying hydrated Follow instructions from your health care provider about hydration, which may include:  Up to 2 hours before the procedure - you may continue to drink clear liquids, such as water, clear fruit juice, black coffee, and plain tea. Eating and drinking restrictions Follow  instructions from your health care provider about eating and drinking, which may include:  8 hours before the procedure - stop eating heavy meals or foods such as meat, fried foods, or fatty foods.  6 hours before the procedure - stop eating light meals or foods, such as toast or cereal.  6 hours before the procedure - stop drinking milk or drinks that contain milk.  2 hours before the procedure - stop drinking clear liquids. Medicine Ask your health care provider about:  Changing or stopping your regular medicines. This is especially important if you are taking diabetes medicines or blood thinners.  Taking medicines such as aspirin and ibuprofen. These medicines can thin your blood. Do not take these medicines before your procedure if your health care provider instructs you not to.  Tests and exams  You will have a physical exam.  You may have blood tests done to show: ? How well your kidneys and liver are working. ? How well your blood can clot. General instructions  Plan to have someone take you home from the   hospital or clinic.  If you will be going home right after the procedure, plan to have someone with you for 24 hours. What happens during the procedure?  An IV tube will be inserted into one of your veins.  Medicine to help you relax (sedative) will be given through the IV tube.  The medical or dental procedure will be performed. What happens after the procedure?  Your blood pressure, heart rate, breathing rate, and blood oxygen level will be monitored often until the medicines you were given have worn off.  Do not drive for 24 hours. This information is not intended to replace advice given to you by your health care provider. Make sure you discuss any questions you have with your health care provider. Document Revised: 01/23/2017 Document Reviewed: 06/02/2015 Elsevier Patient Education  2020 Elsevier Inc.  

## 2019-04-08 NOTE — H&P (Signed)
Chief Complaint: Patient was seen in consultation today for poor venous access  Referring Physician(s): [place]  Supervising Physician: Aletta Edouard  Patient Status: [facility] - Out-pt  History of Present Illness: Christine Butler is a 49 y.o. 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 She has recently developed ascites and is s/p paracentesis x1. She has plans for upcoming infusions and frequent labs draws.  She is referred to radiology for Port-A-Cath placement due to poor venous access.  Patient presents today in her usual state of health. She denies recent fever, chills, nausea, vomiting, abdominal pain. She does endorse jaundice unresolved since 2019. She has been NPO and does not take blood thinners.   Past Medical History:  Diagnosis Date  . Arthritis    right knee  . Ascites--mild this admit 11/06/2017   per patient , now stable with meds and lifestlye adjustment  . CAD (coronary artery disease)    a. s/p CABGx2 in 02/2017 (LAD not suitable for PCI), EF normal.  . Familial hyperlipidemia   . GERD (gastroesophageal reflux disease)   . IBS (irritable bowel syndrome)   . PONV (postoperative nausea and vomiting)    i always throw up on waking up , but last EGD in march had no issues    . Primary biliary cirrhosis (HCC)    cirhosis/liver disease followed by transplant team led by Dr Manuella Ghazi at [facility]   . S/P CABG (coronary artery bypass graft)   . SVD (spontaneous vaginal delivery)    x 3  . Urinary tract bacterial infections     Past Surgical History:  Procedure Laterality Date  . CORONARY ARTERY BYPASS GRAFT N/A 03/24/2017   Procedure: CORONARY ARTERY BYPASS GRAFTING (CABG) x two, using left internal mammary artery, left radial artery, and right leg greater saphenous vein harvested endoscopically;  Surgeon: Ivin Poot, MD;  Location: [place];  Service: Open Heart Surgery;   Laterality: N/A;  . ENDOVEIN HARVEST OF GREATER SAPHENOUS VEIN Right 03/24/2017   Procedure: ENDOVEIN HARVEST OF GREATER SAPHENOUS VEIN;  Surgeon: Ivin Poot, MD;  Location: [place];  Service: Open Heart Surgery;  Laterality: Right;  . ESOPHAGEAL BANDING  02/01/2018   Procedure: ESOPHAGEAL BANDING;  Surgeon: [name], MD;  Location: [facility];  Service: Gastroenterology;;  . ESOPHAGEAL BANDING N/A 03/29/2018   Procedure: ESOPHAGEAL BANDING;  Surgeon: [name], MD;  Location: [facility];  Service: Gastroenterology;  Laterality: N/A;  . ESOPHAGEAL BANDING N/A 05/21/2018   Procedure: ESOPHAGEAL BANDING;  Surgeon: [name], MD;  Location: [facility];  Service: Gastroenterology;  Laterality: N/A;  . ESOPHAGEAL BANDING N/A 10/11/2018   Procedure: ESOPHAGEAL BANDING;  Surgeon: [name], MD;  Location: [facility];  Service: Gastroenterology;  Laterality: N/A;  . ESOPHAGOGASTRODUODENOSCOPY N/A 03/29/2018   Procedure: ESOPHAGOGASTRODUODENOSCOPY (EGD);  Surgeon: [name], MD;  Location: [facility];  Service: Gastroenterology;  Laterality: N/A;  . ESOPHAGOGASTRODUODENOSCOPY N/A 05/21/2018   Procedure: ESOPHAGOGASTRODUODENOSCOPY (EGD);  Surgeon: [name], MD;  Location: [facility];  Service: Gastroenterology;  Laterality: N/A;  . ESOPHAGOGASTRODUODENOSCOPY (EGD) WITH PROPOFOL N/A 02/01/2018   Procedure: ESOPHAGOGASTRODUODENOSCOPY (EGD) WITH PROPOFOL;  Surgeon: [name], MD;  Location: [facility];  Service: Gastroenterology;  Laterality: N/A;  . ESOPHAGOGASTRODUODENOSCOPY (EGD) WITH PROPOFOL N/A 10/11/2018   Procedure: ESOPHAGOGASTRODUODENOSCOPY (EGD) WITH PROPOFOL;  Surgeon: [name], MD;  Location: [facility];  Service: Gastroenterology;  Laterality: N/A;  . INCONTINENCE SURGERY    .  LEEP N/A 03/28/2014   Procedure: LOOP ELECTROSURGICAL EXCISION PROCEDURE (LEEP) [facility] biopsy;  Surgeon: Cyril Mourning, MD;  Location: [facility];  Service: Gynecology;  Laterality: N/A;  . LEFT  HEART CATH AND CORONARY ANGIOGRAPHY N/A 03/23/2017   Procedure: LEFT HEART CATH AND CORONARY ANGIOGRAPHY;  Surgeon: Jettie Booze, MD;  Location: [facility];  Service: Cardiovascular;  Laterality: N/A;  . LIVER BIOPSY     x 2  . RADIAL ARTERY HARVEST Left 03/24/2017   Procedure: RADIAL ARTERY HARVEST;  Surgeon: Ivin Poot, MD;  Location: [place];  Service: Open Heart Surgery;  Laterality: Left;  . TEE WITHOUT CARDIOVERSION N/A 03/24/2017   Procedure: TRANSESOPHAGEAL ECHOCARDIOGRAM (TEE);  Surgeon: Prescott Gum, Collier Salina, MD;  Location: [place];  Service: Open Heart Surgery;  Laterality: N/A;  . WISDOM TOOTH EXTRACTION      Allergies: Codeine, Erythromycin, and Fentanyl  Medications: Prior to Admission medications   Medication Sig Start Date End Date Taking? Authorizing Provider  Alirocumab (PRALUENT) 150 MG/ML SOAJ Inject 1 Dose into the skin every 14 (fourteen) days. 01/08/18  Yes Pixie Casino, MD  aspirin 81 MG EC tablet Take 81 mg by mouth daily. Swallow whole.   Yes [provider]  calcium carbonate (TUMS - DOSED IN MG ELEMENTAL CALCIUM) 500 MG chewable tablet Chew 2 tablets by mouth daily.   Yes [provider]  Calcium Citrate-Vitamin D 200-125 MG-UNIT TABS Take 1 tablet by mouth 2 (two) times daily.   Yes [provider]  furosemide (LASIX) 20 MG tablet Take 1 tablet (20 mg total) by mouth daily. Patient taking differently: Take 40 mg by mouth daily.  11/06/17  Yes Isaiah Serge, NP  hydrOXYzine (ATARAX/VISTARIL) 25 MG tablet Take 25 mg by mouth at bedtime as needed for itching.    Yes [provider]  Melatonin 3 MG CAPS Take 6 mg by mouth at bedtime as needed (for sleep).   Yes [provider]  metoprolol tartrate (LOPRESSOR) 25 MG tablet Take 0.5 tablets (12.5 mg total) by mouth 2 (two) times daily. 09/20/18  Yes Revankar, Reita Cliche, MD  Multiple Vitamins-Minerals (DEKAS PLUS PO) Take 1 capsule by mouth daily.   Yes [provider]  nitroGLYCERIN (NITROSTAT) 0.4 MG SL tablet Place 1 tablet (0.4 mg total) under the tongue every 5 (five) minutes as needed for chest pain. 05/12/17 05/10/19 Yes Ward, Ozella Almond, PA-C  ondansetron (ZOFRAN ODT) 4 MG disintegrating tablet Take 1 tablet (4 mg total) by mouth every 8 (eight) hours as needed for nausea or vomiting. 01/26/19  Yes Lucrezia Starch, MD  Probiotic Product (PROBIOTIC PO) Take 1 capsule by mouth daily.    Yes [provider]  spironolactone (ALDACTONE) 50 MG tablet Take 100 mg by mouth daily.  08/24/17  Yes [provider]  ursodiol (ACTIGALL) 300 MG capsule Take 300 mg by mouth 3 (three) times daily.    Yes [provider]  metroNIDAZOLE (METROCREAM) 0.75 % cream Apply 1 application topically 2 (two) times daily.    [provider]  triamcinolone cream (KENALOG) 0.1 % Apply 1 application topically 2 (two) times daily.    [provider]     Family History  Problem Relation Age of Onset  . CAD Father   . Diabetes Mellitus II Father   . Heart disease Father   . Heart attack Brother   . Heart disease Maternal Aunt   . Heart attack Paternal Grandmother   .  Heart attack Paternal Grandfather     Social History   Socioeconomic History  . Marital status: Divorced    Spouse name: Not on file  . Number of children: 3  . Years of education: 69  . Highest education level: Associate degree: academic program  Occupational History  . Not on file  Tobacco Use  . Smoking status: Never Smoker  . Smokeless tobacco: Never Used  Substance and Sexual Activity  . Alcohol use: Not Currently    Alcohol/week: 1.0 standard drinks    Types: 1 Glasses of wine per week    Comment: for dinner  . Drug use: No  . Sexual activity: Yes    Partners: Male    Birth control/protection: None  Other Topics Concern  . Not on file  Social History Narrative  . Not on file   Social Determinants of Health   Financial Resource  Strain:   . Difficulty of Paying Living Expenses: Not on file  Food Insecurity:   . Worried About Charity fundraiser in the Last Year: Not on file  . Ran Out of Food in the Last Year: Not on file  Transportation Needs:   . Lack of Transportation (Medical): Not on file  . Lack of Transportation (Non-Medical): Not on file  Physical Activity:   . Days of Exercise per Week: Not on file  . Minutes of Exercise per Session: Not on file  Stress:   . Feeling of Stress : Not on file  Social Connections:   . Frequency of Communication with Friends and Family: Not on file  . Frequency of Social Gatherings with Friends and Family: Not on file  . Attends Religious Services: Not on file  . Active Member of Clubs or Organizations: Not on file  . Attends Archivist Meetings: Not on file  . Marital Status: Not on file     Review of Systems: A 12 point ROS discussed and pertinent positives are indicated in the HPI above.  All other systems are negative.  Review of Systems  Constitutional: Negative for fatigue and fever.  Respiratory: Negative for cough and shortness of breath.   Cardiovascular: Negative for chest pain.  Gastrointestinal: Negative for abdominal pain, constipation, diarrhea, nausea and vomiting.  Genitourinary: Negative for dysuria.  Musculoskeletal: Negative for back pain.  Psychiatric/Behavioral: Negative for behavioral problems and confusion.    Vital Signs: BP (!) 102/54   Pulse 77   Temp 98.1 F (36.7 C) (Skin)   Resp 16   Ht 5\' 2"  (1.575 m)   Wt 142 lb (64.4 kg)   SpO2 100%   BMI 25.97 kg/m   Physical Exam Vitals and nursing note reviewed.  Constitutional:      Appearance: Normal appearance.  HENT:     Mouth/Throat:     Mouth: Mucous membranes are moist.     Pharynx: Oropharynx is clear.  Cardiovascular:     Rate and Rhythm: Normal rate and regular rhythm.     Pulses: Normal pulses.  Pulmonary:     Effort: Pulmonary effort is normal.     Breath  sounds: Normal breath sounds.  Abdominal:     General: Abdomen is flat.     Palpations: Abdomen is soft.  Musculoskeletal:     Cervical back: Normal range of motion and neck supple.  Skin:    General: Skin is warm and dry.  Neurological:     General: No focal deficit present.     Mental Status: She  is alert and oriented to person, place, and time. Mental status is at baseline.  Psychiatric:        Mood and Affect: Mood normal.        Behavior: Behavior normal.        Thought Content: Thought content normal.        Judgment: Judgment normal.      MD Evaluation Airway: WNL Heart: WNL Abdomen: WNL Chest/ Lungs: WNL ASA  Classification: 3 Mallampati/Airway Score: One   Imaging: No results found.  Labs:  CBC: Recent Labs    01/26/19 0756  WBC 7.2  HGB 10.8*  HCT 32.4*  PLT 146*    COAGS: Recent Labs    01/26/19 0756  INR 1.2    BMP: Recent Labs    01/26/19 0756  NA 131*  K 4.1  CL 101  CO2 20*  GLUCOSE 100*  BUN 9  CALCIUM 8.7*  CREATININE 0.50  GFRNONAA >60  GFRAA >60    LIVER FUNCTION TESTS: Recent Labs    01/26/19 0756  BILITOT 8.6*  AST 72*  ALT 43  ALKPHOS 245*  PROT 6.8  ALBUMIN 2.4*    TUMOR MARKERS: No results for input(s): AFPTM, CEA, CA199, CHROMGRNA in the last 8760 hours.  Assessment and Plan: Patient with past medical history of primary biliary cirrhosis presents with complaint of poor venous access requiring frequent lab draws and upcoming infusions.  IR consulted for Port-A-Cath placement at the request of Dr. Marene Lenz.  Patient presents today in their usual state of health.  She has been NPO and is not currently on blood thinners.   Risks and benefits of image guided port-a-catheter placement was discussed with the patient including, but not limited to bleeding, infection, pneumothorax, or fibrin sheath development and need for additional procedures.  All of the patient's questions were answered, patient is agreeable  to proceed. Consent signed and in chart.   Thank you for this interesting consult.  I greatly enjoyed meeting Christine Butler and look forward to participating in their care.  A copy of this report was sent to the requesting provider on this date.  Electronically Signed: Docia Barrier, PA 04/08/2019, 12:38 PM   I spent a total of  30 Minutes   in face to face in clinical consultation, greater than 50% of which was counseling/coordinating care for poor venous access.

## 2019-04-10 DIAGNOSIS — Z885 Allergy status to narcotic agent status: Secondary | ICD-10-CM | POA: Diagnosis not present

## 2019-04-10 DIAGNOSIS — M8580 Other specified disorders of bone density and structure, unspecified site: Secondary | ICD-10-CM | POA: Diagnosis not present

## 2019-04-10 DIAGNOSIS — K449 Diaphragmatic hernia without obstruction or gangrene: Secondary | ICD-10-CM | POA: Diagnosis not present

## 2019-04-10 DIAGNOSIS — Z881 Allergy status to other antibiotic agents status: Secondary | ICD-10-CM | POA: Diagnosis not present

## 2019-04-10 DIAGNOSIS — R0789 Other chest pain: Secondary | ICD-10-CM | POA: Diagnosis not present

## 2019-04-10 DIAGNOSIS — K7290 Hepatic failure, unspecified without coma: Secondary | ICD-10-CM | POA: Diagnosis not present

## 2019-04-10 DIAGNOSIS — Z7982 Long term (current) use of aspirin: Secondary | ICD-10-CM | POA: Diagnosis not present

## 2019-04-10 DIAGNOSIS — R109 Unspecified abdominal pain: Secondary | ICD-10-CM | POA: Diagnosis not present

## 2019-04-10 DIAGNOSIS — R079 Chest pain, unspecified: Secondary | ICD-10-CM | POA: Diagnosis not present

## 2019-04-10 DIAGNOSIS — I2510 Atherosclerotic heart disease of native coronary artery without angina pectoris: Secondary | ICD-10-CM | POA: Diagnosis not present

## 2019-04-10 DIAGNOSIS — R918 Other nonspecific abnormal finding of lung field: Secondary | ICD-10-CM | POA: Diagnosis not present

## 2019-04-10 DIAGNOSIS — R140 Abdominal distension (gaseous): Secondary | ICD-10-CM | POA: Diagnosis not present

## 2019-04-10 DIAGNOSIS — Z79899 Other long term (current) drug therapy: Secondary | ICD-10-CM | POA: Diagnosis not present

## 2019-04-11 ENCOUNTER — Encounter (HOSPITAL_COMMUNITY)
Admission: RE | Admit: 2019-04-11 | Discharge: 2019-04-11 | Disposition: A | Discharge status: Home / Self Care | Payer: 59 | Source: Ambulatory Visit | Attending: Transplant Hepatology | Admitting: Transplant Hepatology

## 2019-04-11 ENCOUNTER — Other Ambulatory Visit: Payer: Self-pay

## 2019-04-11 ENCOUNTER — Emergency Department
Admit: 2019-04-11 | Discharge: 2019-04-11 | Disposition: A | Discharge status: Home / Self Care | Payer: PRIVATE HEALTH INSURANCE | Attending: Student in an Organized Health Care Education/Training Program

## 2019-04-11 ENCOUNTER — Ambulatory Visit
Admit: 2019-04-11 | Discharge: 2019-04-11 | Disposition: A | Discharge status: Home / Self Care | Payer: PRIVATE HEALTH INSURANCE | Attending: Student in an Organized Health Care Education/Training Program

## 2019-04-11 DIAGNOSIS — K7290 Hepatic failure, unspecified without coma: Secondary | ICD-10-CM | POA: Diagnosis not present

## 2019-04-11 DIAGNOSIS — K7460 Unspecified cirrhosis of liver: Secondary | ICD-10-CM | POA: Diagnosis not present

## 2019-04-11 DIAGNOSIS — R079 Chest pain, unspecified: Secondary | ICD-10-CM | POA: Diagnosis not present

## 2019-04-11 DIAGNOSIS — R109 Unspecified abdominal pain: Secondary | ICD-10-CM | POA: Diagnosis not present

## 2019-04-11 DIAGNOSIS — R918 Other nonspecific abnormal finding of lung field: Secondary | ICD-10-CM | POA: Diagnosis not present

## 2019-04-11 MED ORDER — HEPARIN SOD (PORK) LOCK FLUSH 100 UNIT/ML IV SOLN
INTRAVENOUS | Status: AC
  Administered 2019-04-11: 500 [IU]
  Filled 2019-04-11: qty 5

## 2019-04-11 MED ORDER — ALBUMIN HUMAN 25 % IV SOLN
50.0000 g | INTRAVENOUS | Status: DC
  Administered 2019-04-11: 50 g via INTRAVENOUS
  Filled 2019-04-11: qty 200

## 2019-04-11 MED ORDER — FUROSEMIDE 10 MG/ML IJ SOLN: 40.0000 mg | INTRAMUSCULAR | Status: DC

## 2019-04-11 MED ORDER — FUROSEMIDE 10 MG/ML IJ SOLN
INTRAMUSCULAR | Status: AC
  Administered 2019-04-11: 40 mg via INTRAVENOUS
  Filled 2019-04-11: qty 4

## 2019-04-11 NOTE — Progress Notes (Signed)
Pt here today for albumin and lasix.  She stated she spent the night in the ER at [place] because she had difficulty breathing and her weight was up 20 pounds.  I called and spoke with Gilmore Laroche, her transplant coordinator and let her know the above.  Gilmore Laroche stated to have the patient check her my chart, that she was sending her a message thru it, and to proceed with her albumin and lasix today.  Pt verbalized understanding.

## 2019-04-12 ENCOUNTER — Encounter: Payer: Self-pay | Admitting: *Deleted

## 2019-04-12 ENCOUNTER — Other Ambulatory Visit: Payer: Self-pay | Admitting: *Deleted

## 2019-04-12 NOTE — Patient Outreach (Signed)
[facility]) Care Management  04/12/2019  Christine Butler 03/24/1970 [id number]   Transition of care call/case closure   Referral received: 04/07/19 Initial outreach:04/07/19 Insurance: UMR    Subjective: Initial successful telephone call to patient's preferred number in order to complete transition of care assessment; 2 HIPAA identifiers verified. Explained purpose of call and completed transition of care assessment.  Christine Butler reports feeling okay on today , She discussed hospitalization at [place] for hyponatremia, recent ED visit at [facility] related to 20 lbs weight gain. She reports feeling better after having weekly [facility]. She hasn't weight yet this morning, but keeps track of daily weights,  but abdomen feels better, little less swelling in legs, she denies abdominal pain, shortness of breath . She reports tolerating diet okay, remains on 1.5 liter fluid restriction and sodium restrictions .She reports urinating at least 4 times on yesterday after treatment and hopeful on weight loss. She discussed being in the top 10 list for  liver transplant list now, and communicates with transplant coordinator frequently reinforced notifying sooner for new or worsening symptoms of increased fluid weight, abdominal discomfort , dizziness/weakness.  She is back on daily lasix and spironolactone.  She reports having her sister available to assist in her care.   She discussed being on continuous FMLA and applying for STD, unable to safely work. .  She  says she  uses a [facility] pharmacy at [facility] [employer]   She denies educational needs related to staying safe during the [place] 19 pandemic. Discussed preparing for upcoming weather event due to possible power outages.    Objective:  Christine Butler   was hospitalized at [facility]  From 2/3-04/02/19   with/for Hyponatremia, ECD during admission grade 11 esphogeal varies, for  repeat banding. . Comorbidities include: PMHx of Cholestatic Cirrhosis, End stage liver disease ,Ascites, Listed for Liver transplant, CAD s/p CABG 03/24/17, hyperlipidemia . She was discharged to home on 04/02/19 without the need for home health services or durable medical equipment per the discharge summary.     Assessment:  Patient voices good understanding of all discharge instructions.  See transition of care flowsheet for assessment details.   Plan:  Reviewed hospital discharge diagnosis of hyponatremia and end stage liver disease  and discharge treatment plan using hospital discharge instructions, assessing medication adherence, reviewing problems requiring provider notification, and discussing the importance of follow up with  primary care provider and/or specialists as directed. Reviewed [place] healthy lifestyle program information to receive discounted premium for  2022  Step 1: Get annual physical between February 24, 2018 and August 25, 2019; Step 2: Complete your health assessment between February 25, 2019 and October 26, 2019 at [email or web address] Step 3:Identify your current health status and complete the corresponding action step between January 1, and October 26, 2019.     No ongoing care management needs identified so will close case to [place] [facility] services , unsuccessful outreach letter with [facility] care management resource information has been mailed to patient on 04/07/19.  Christine Draft, RN, BSN  [place] Management Coordinator  [phone]- Mobile [phone]- Toll Free Main Office

## 2019-04-13 ENCOUNTER — Encounter (HOSPITAL_COMMUNITY): Admission: RE | Payer: Self-pay | Source: Home / Self Care

## 2019-04-13 ENCOUNTER — Ambulatory Visit (HOSPITAL_COMMUNITY): Admission: RE | Admit: 2019-04-13 | Payer: 59 | Source: Home / Self Care | Admitting: Gastroenterology

## 2019-04-13 DIAGNOSIS — K7290 Hepatic failure, unspecified without coma: Principal | ICD-10-CM

## 2019-04-13 SURGERY — ESOPHAGOGASTRODUODENOSCOPY (EGD) WITH PROPOFOL
Anesthesia: Monitor Anesthesia Care

## 2019-04-14 DIAGNOSIS — K7290 Hepatic failure, unspecified without coma: Principal | ICD-10-CM

## 2019-04-15 ENCOUNTER — Other Ambulatory Visit (HOSPITAL_COMMUNITY): Payer: Self-pay | Admitting: *Deleted

## 2019-04-18 ENCOUNTER — Encounter (HOSPITAL_COMMUNITY)
Admission: RE | Admit: 2019-04-18 | Discharge: 2019-04-18 | Disposition: A | Discharge status: Home / Self Care | Payer: 59 | Source: Ambulatory Visit | Attending: Transplant Hepatology | Admitting: Transplant Hepatology

## 2019-04-18 ENCOUNTER — Other Ambulatory Visit: Payer: Self-pay

## 2019-04-18 DIAGNOSIS — K7290 Hepatic failure, unspecified without coma: Secondary | ICD-10-CM | POA: Diagnosis not present

## 2019-04-18 DIAGNOSIS — K7460 Unspecified cirrhosis of liver: Secondary | ICD-10-CM | POA: Diagnosis not present

## 2019-04-18 LAB — COMPREHENSIVE METABOLIC PANEL
ALT: 29 U/L (ref 0–44)
AST: 56 U/L — ABNORMAL HIGH (ref 15–41)
Albumin: 3.1 g/dL — ABNORMAL LOW (ref 3.5–5.0)
Alkaline Phosphatase: 155 U/L — ABNORMAL HIGH (ref 38–126)
Anion gap: 12 (ref 5–15)
BUN: 8 mg/dL (ref 6–20)
CO2: 23 mmol/L (ref 22–32)
Calcium: 9 mg/dL (ref 8.9–10.3)
Chloride: 101 mmol/L (ref 98–111)
Creatinine, Ser: 0.67 mg/dL (ref 0.44–1.00)
GFR calc Af Amer: >60 mL/min (ref >60)
GFR calc non Af Amer: >60 mL/min (ref >60)
Glucose, Bld: 113 mg/dL — ABNORMAL HIGH (ref 70–99)
Potassium: 4 mmol/L (ref 3.5–5.1)
Sodium: 136 mmol/L (ref 135–145)
Total Bilirubin: 8.1 mg/dL — ABNORMAL HIGH (ref 0.3–1.2)
Total Protein: 6.3 g/dL — ABNORMAL LOW (ref 6.5–8.1)

## 2019-04-18 LAB — PROTIME-INR
INR: 1.4 — ABNORMAL HIGH (ref 0.8–1.2)
Prothrombin Time: 17.1 seconds — ABNORMAL HIGH (ref 11.4–15.2)

## 2019-04-18 MED ORDER — FUROSEMIDE 10 MG/ML IJ SOLN
INTRAMUSCULAR | Status: AC
  Administered 2019-04-18: 40 mg via INTRAVENOUS
  Filled 2019-04-18: qty 4

## 2019-04-18 MED ORDER — ALBUMIN HUMAN 25 % IV SOLN
50.0000 g | INTRAVENOUS | Status: DC
  Administered 2019-04-18: 50 g via INTRAVENOUS
  Filled 2019-04-18: qty 200

## 2019-04-18 MED ORDER — HEPARIN SOD (PORK) LOCK FLUSH 100 UNIT/ML IV SOLN
INTRAVENOUS | Status: AC
  Administered 2019-04-18: 500 [IU]
  Filled 2019-04-18: qty 5

## 2019-04-18 MED ORDER — FUROSEMIDE 10 MG/ML IJ SOLN: 40.0000 mg | INTRAMUSCULAR | Status: DC

## 2019-04-25 ENCOUNTER — Other Ambulatory Visit: Payer: Self-pay

## 2019-04-25 ENCOUNTER — Ambulatory Visit (HOSPITAL_COMMUNITY)
Admission: RE | Admit: 2019-04-25 | Discharge: 2019-04-25 | Disposition: A | Discharge status: Home / Self Care | Payer: 59 | Source: Ambulatory Visit | Attending: Transplant Hepatology | Admitting: Transplant Hepatology

## 2019-04-25 DIAGNOSIS — K7469 Other cirrhosis of liver: Secondary | ICD-10-CM | POA: Insufficient documentation

## 2019-04-25 DIAGNOSIS — Z1231 Encounter for screening mammogram for malignant neoplasm of breast: Secondary | ICD-10-CM | POA: Diagnosis not present

## 2019-04-25 LAB — COMPREHENSIVE METABOLIC PANEL
ALT: 40 U/L (ref 0–44)
AST: 76 U/L — ABNORMAL HIGH (ref 15–41)
Albumin: 3.5 g/dL (ref 3.5–5.0)
Alkaline Phosphatase: 153 U/L — ABNORMAL HIGH (ref 38–126)
Anion gap: 10 (ref 5–15)
BUN: 16 mg/dL (ref 6–20)
CO2: 23 mmol/L (ref 22–32)
Calcium: 9.5 mg/dL (ref 8.9–10.3)
Chloride: 97 mmol/L — ABNORMAL LOW (ref 98–111)
Creatinine, Ser: 0.69 mg/dL (ref 0.44–1.00)
GFR calc Af Amer: >60 mL/min (ref >60)
GFR calc non Af Amer: >60 mL/min (ref >60)
Glucose, Bld: 155 mg/dL — ABNORMAL HIGH (ref 70–99)
Potassium: 3.9 mmol/L (ref 3.5–5.1)
Sodium: 130 mmol/L — ABNORMAL LOW (ref 135–145)
Total Bilirubin: 8.2 mg/dL — ABNORMAL HIGH (ref 0.3–1.2)
Total Protein: 7.1 g/dL (ref 6.5–8.1)

## 2019-04-25 LAB — PROTIME-INR
INR: 1.4 — ABNORMAL HIGH (ref 0.8–1.2)
Prothrombin Time: 16.6 seconds — ABNORMAL HIGH (ref 11.4–15.2)

## 2019-04-25 MED ORDER — ALBUMIN HUMAN 25 % IV SOLN
50.0000 g | INTRAVENOUS | Status: DC
  Administered 2019-04-25: 50 g via INTRAVENOUS
  Filled 2019-04-25: qty 200

## 2019-04-25 MED ORDER — HEPARIN SOD (PORK) LOCK FLUSH 100 UNIT/ML IV SOLN
INTRAVENOUS | Status: AC
  Administered 2019-04-25: 500 [IU] via INTRAVENOUS
  Filled 2019-04-25: qty 5

## 2019-04-25 MED ORDER — FUROSEMIDE 10 MG/ML IJ SOLN
INTRAMUSCULAR | Status: AC
  Administered 2019-04-25: 40 mg via INTRAVENOUS
  Filled 2019-04-25: qty 4

## 2019-04-25 MED ORDER — FUROSEMIDE 10 MG/ML IJ SOLN: 40.0000 mg | INTRAMUSCULAR | Status: DC

## 2019-04-25 MED ORDER — HEPARIN SOD (PORK) LOCK FLUSH 100 UNIT/ML IV SOLN: 500.0000 [IU] | Freq: Once | INTRAVENOUS | Status: AC

## 2019-04-28 DIAGNOSIS — K7290 Hepatic failure, unspecified without coma: Principal | ICD-10-CM

## 2019-04-28 MED FILL — LIDOCAINE-PRILOCAINE CREAM: 2.5-2.5 | 28 days supply | Qty: 30 | Fill #0

## 2019-05-02 ENCOUNTER — Other Ambulatory Visit: Payer: Self-pay

## 2019-05-02 ENCOUNTER — Ambulatory Visit (HOSPITAL_COMMUNITY)
Admission: RE | Admit: 2019-05-02 | Discharge: 2019-05-02 | Disposition: A | Discharge status: Home / Self Care | Payer: 59 | Source: Ambulatory Visit | Attending: Transplant Hepatology | Admitting: Transplant Hepatology

## 2019-05-02 DIAGNOSIS — K7469 Other cirrhosis of liver: Secondary | ICD-10-CM | POA: Diagnosis not present

## 2019-05-02 LAB — COMPREHENSIVE METABOLIC PANEL
ALT: 46 U/L — ABNORMAL HIGH (ref 0–44)
AST: 77 U/L — ABNORMAL HIGH (ref 15–41)
Albumin: 3.5 g/dL (ref 3.5–5.0)
Alkaline Phosphatase: 150 U/L — ABNORMAL HIGH (ref 38–126)
Anion gap: 9 (ref 5–15)
BUN: 15 mg/dL (ref 6–20)
CO2: 22 mmol/L (ref 22–32)
Calcium: 9 mg/dL (ref 8.9–10.3)
Chloride: 98 mmol/L (ref 98–111)
Creatinine, Ser: 0.71 mg/dL (ref 0.44–1.00)
GFR calc Af Amer: >60 mL/min (ref >60)
GFR calc non Af Amer: >60 mL/min (ref >60)
Glucose, Bld: 143 mg/dL — ABNORMAL HIGH (ref 70–99)
Potassium: 4.4 mmol/L (ref 3.5–5.1)
Sodium: 129 mmol/L — ABNORMAL LOW (ref 135–145)
Total Bilirubin: 7.6 mg/dL — ABNORMAL HIGH (ref 0.3–1.2)
Total Protein: 6.8 g/dL (ref 6.5–8.1)

## 2019-05-02 LAB — PROTIME-INR
INR: 1.3 — ABNORMAL HIGH (ref 0.8–1.2)
Prothrombin Time: 16.3 seconds — ABNORMAL HIGH (ref 11.4–15.2)

## 2019-05-02 MED ORDER — FUROSEMIDE 10 MG/ML IJ SOLN: 40.0000 mg | INTRAMUSCULAR | Status: DC

## 2019-05-02 MED ORDER — ALBUMIN HUMAN 25 % IV SOLN
50.0000 g | INTRAVENOUS | Status: DC
  Administered 2019-05-02: 50 g via INTRAVENOUS
  Filled 2019-05-02: qty 200

## 2019-05-02 MED ORDER — HEPARIN SOD (PORK) LOCK FLUSH 100 UNIT/ML IV SOLN: 500.0000 [IU] | Freq: Once | INTRAVENOUS | Status: DC

## 2019-05-02 MED ORDER — FUROSEMIDE 10 MG/ML IJ SOLN
INTRAMUSCULAR | Status: AC
  Administered 2019-05-02: 40 mg via INTRAVENOUS
  Filled 2019-05-02: qty 4

## 2019-05-02 MED ORDER — HEPARIN SOD (PORK) LOCK FLUSH 100 UNIT/ML IV SOLN
INTRAVENOUS | Status: AC
  Administered 2019-05-02: 500 [IU] via INTRAVENOUS
  Filled 2019-05-02: qty 5

## 2019-05-04 DIAGNOSIS — R87611 Atypical squamous cells cannot exclude high grade squamous intraepithelial lesion on cytologic smear of cervix (ASC-H): Secondary | ICD-10-CM | POA: Diagnosis not present

## 2019-05-04 DIAGNOSIS — N871 Moderate cervical dysplasia: Secondary | ICD-10-CM | POA: Diagnosis not present

## 2019-05-04 DIAGNOSIS — R87613 High grade squamous intraepithelial lesion on cytologic smear of cervix (HGSIL): Secondary | ICD-10-CM

## 2019-05-04 DIAGNOSIS — R87612 Low grade squamous intraepithelial lesion on cytologic smear of cervix (LGSIL): Secondary | ICD-10-CM | POA: Diagnosis not present

## 2019-05-04 HISTORY — DX: High grade squamous intraepithelial lesion on cytologic smear of cervix (HGSIL): R87.613

## 2019-05-09 ENCOUNTER — Other Ambulatory Visit: Payer: Self-pay

## 2019-05-09 ENCOUNTER — Encounter (HOSPITAL_COMMUNITY)
Admission: RE | Admit: 2019-05-09 | Discharge: 2019-05-09 | Disposition: A | Discharge status: Home / Self Care | Payer: 59 | Source: Ambulatory Visit | Attending: Transplant Hepatology | Admitting: Transplant Hepatology

## 2019-05-09 DIAGNOSIS — K7290 Hepatic failure, unspecified without coma: Secondary | ICD-10-CM | POA: Diagnosis not present

## 2019-05-09 DIAGNOSIS — K7460 Unspecified cirrhosis of liver: Secondary | ICD-10-CM | POA: Insufficient documentation

## 2019-05-09 LAB — COMPREHENSIVE METABOLIC PANEL
ALT: 55 U/L — ABNORMAL HIGH (ref 0–44)
AST: 85 U/L — ABNORMAL HIGH (ref 15–41)
Albumin: 3.5 g/dL (ref 3.5–5.0)
Alkaline Phosphatase: 153 U/L — ABNORMAL HIGH (ref 38–126)
Anion gap: 7 (ref 5–15)
BUN: 15 mg/dL (ref 6–20)
CO2: 21 mmol/L — ABNORMAL LOW (ref 22–32)
Calcium: 8.9 mg/dL (ref 8.9–10.3)
Chloride: 99 mmol/L (ref 98–111)
Creatinine, Ser: 0.75 mg/dL (ref 0.44–1.00)
GFR calc Af Amer: >60 mL/min (ref >60)
GFR calc non Af Amer: >60 mL/min (ref >60)
Glucose, Bld: 131 mg/dL — ABNORMAL HIGH (ref 70–99)
Potassium: 4.4 mmol/L (ref 3.5–5.1)
Sodium: 127 mmol/L — ABNORMAL LOW (ref 135–145)
Total Bilirubin: 7.5 mg/dL — ABNORMAL HIGH (ref 0.3–1.2)
Total Protein: 7 g/dL (ref 6.5–8.1)

## 2019-05-09 LAB — PROTIME-INR
INR: 1.3 — ABNORMAL HIGH (ref 0.8–1.2)
Prothrombin Time: 16.1 seconds — ABNORMAL HIGH (ref 11.4–15.2)

## 2019-05-09 MED ORDER — HEPARIN SOD (PORK) LOCK FLUSH 100 UNIT/ML IV SOLN: 500.0000 [IU] | Freq: Once | INTRAVENOUS | Status: DC

## 2019-05-09 MED ORDER — ALBUMIN HUMAN 25 % IV SOLN
50.0000 g | INTRAVENOUS | Status: DC
  Administered 2019-05-09: 50 g via INTRAVENOUS
  Filled 2019-05-09: qty 200

## 2019-05-09 MED ORDER — HEPARIN SOD (PORK) LOCK FLUSH 100 UNIT/ML IV SOLN
INTRAVENOUS | Status: AC
  Administered 2019-05-09: 500 [IU] via INTRAVENOUS
  Filled 2019-05-09: qty 5

## 2019-05-09 MED ORDER — FUROSEMIDE 10 MG/ML IJ SOLN
INTRAMUSCULAR | Status: AC
  Administered 2019-05-09: 40 mg via INTRAVENOUS
  Filled 2019-05-09: qty 4

## 2019-05-09 MED ORDER — FUROSEMIDE 10 MG/ML IJ SOLN: 40.0000 mg | INTRAMUSCULAR | Status: DC

## 2019-05-10 MED FILL — ALPRAZolam 1 MG TABS: 1 | 1 days supply | Qty: 1 | Fill #0

## 2019-05-11 DIAGNOSIS — N870 Mild cervical dysplasia: Secondary | ICD-10-CM | POA: Diagnosis not present

## 2019-05-11 DIAGNOSIS — Z9889 Other specified postprocedural states: Secondary | ICD-10-CM

## 2019-05-11 DIAGNOSIS — D069 Carcinoma in situ of cervix, unspecified: Secondary | ICD-10-CM | POA: Diagnosis not present

## 2019-05-11 DIAGNOSIS — N871 Moderate cervical dysplasia: Secondary | ICD-10-CM | POA: Diagnosis not present

## 2019-05-11 HISTORY — DX: Other specified postprocedural states: Z98.890

## 2019-05-16 ENCOUNTER — Other Ambulatory Visit: Payer: Self-pay

## 2019-05-16 ENCOUNTER — Encounter (HOSPITAL_COMMUNITY)
Admission: RE | Admit: 2019-05-16 | Discharge: 2019-05-16 | Disposition: A | Discharge status: Home / Self Care | Payer: 59 | Source: Ambulatory Visit | Attending: Transplant Hepatology | Admitting: Transplant Hepatology

## 2019-05-16 DIAGNOSIS — K7290 Hepatic failure, unspecified without coma: Secondary | ICD-10-CM | POA: Diagnosis not present

## 2019-05-16 DIAGNOSIS — K7460 Unspecified cirrhosis of liver: Secondary | ICD-10-CM | POA: Diagnosis not present

## 2019-05-16 LAB — COMPREHENSIVE METABOLIC PANEL
ALT: 43 U/L (ref 0–44)
AST: 66 U/L — ABNORMAL HIGH (ref 15–41)
Albumin: 3.6 g/dL (ref 3.5–5.0)
Alkaline Phosphatase: 167 U/L — ABNORMAL HIGH (ref 38–126)
Anion gap: 13 (ref 5–15)
BUN: 24 mg/dL — ABNORMAL HIGH (ref 6–20)
CO2: 20 mmol/L — ABNORMAL LOW (ref 22–32)
Calcium: 9.6 mg/dL (ref 8.9–10.3)
Chloride: 94 mmol/L — ABNORMAL LOW (ref 98–111)
Creatinine, Ser: 0.86 mg/dL (ref 0.44–1.00)
GFR calc Af Amer: >60 mL/min (ref >60)
GFR calc non Af Amer: >60 mL/min (ref >60)
Glucose, Bld: 156 mg/dL — ABNORMAL HIGH (ref 70–99)
Potassium: 4.2 mmol/L (ref 3.5–5.1)
Sodium: 127 mmol/L — ABNORMAL LOW (ref 135–145)
Total Bilirubin: 6.6 mg/dL — ABNORMAL HIGH (ref 0.3–1.2)
Total Protein: 7.3 g/dL (ref 6.5–8.1)

## 2019-05-16 LAB — PROTIME-INR
INR: 1.2 (ref 0.8–1.2)
Prothrombin Time: 15.3 seconds — ABNORMAL HIGH (ref 11.4–15.2)

## 2019-05-16 MED ORDER — FUROSEMIDE 10 MG/ML IJ SOLN
INTRAMUSCULAR | Status: AC
  Administered 2019-05-16: 40 mg via INTRAVENOUS
  Filled 2019-05-16: qty 4

## 2019-05-16 MED ORDER — ALBUMIN HUMAN 25 % IV SOLN
50.0000 g | INTRAVENOUS | Status: DC
  Administered 2019-05-16: 50 g via INTRAVENOUS
  Filled 2019-05-16: qty 200

## 2019-05-16 MED ORDER — FUROSEMIDE 10 MG/ML IJ SOLN: 40.0000 mg | INTRAMUSCULAR | Status: DC

## 2019-05-16 MED ORDER — HEPARIN SOD (PORK) LOCK FLUSH 100 UNIT/ML IV SOLN
INTRAVENOUS | Status: AC
  Administered 2019-05-16: 500 [IU] via INTRAVENOUS
  Filled 2019-05-16: qty 5

## 2019-05-16 MED ORDER — HEPARIN SOD (PORK) LOCK FLUSH 100 UNIT/ML IV SOLN: 500.0000 [IU] | Freq: Once | INTRAVENOUS | Status: AC

## 2019-05-17 DIAGNOSIS — D069 Carcinoma in situ of cervix, unspecified: Secondary | ICD-10-CM | POA: Diagnosis not present

## 2019-05-17 DIAGNOSIS — N72 Inflammatory disease of cervix uteri: Secondary | ICD-10-CM | POA: Diagnosis not present

## 2019-05-17 DIAGNOSIS — R87610 Atypical squamous cells of undetermined significance on cytologic smear of cervix (ASC-US): Secondary | ICD-10-CM | POA: Diagnosis not present

## 2019-05-23 ENCOUNTER — Other Ambulatory Visit: Payer: Self-pay

## 2019-05-23 ENCOUNTER — Encounter (HOSPITAL_COMMUNITY)
Admission: RE | Admit: 2019-05-23 | Discharge: 2019-05-23 | Disposition: A | Discharge status: Home / Self Care | Payer: 59 | Source: Ambulatory Visit | Attending: Transplant Hepatology | Admitting: Transplant Hepatology

## 2019-05-23 DIAGNOSIS — K7290 Hepatic failure, unspecified without coma: Secondary | ICD-10-CM | POA: Diagnosis not present

## 2019-05-23 DIAGNOSIS — K7460 Unspecified cirrhosis of liver: Secondary | ICD-10-CM | POA: Diagnosis not present

## 2019-05-23 LAB — COMPREHENSIVE METABOLIC PANEL
ALT: 46 U/L — ABNORMAL HIGH (ref 0–44)
AST: 68 U/L — ABNORMAL HIGH (ref 15–41)
Albumin: 3.3 g/dL — ABNORMAL LOW (ref 3.5–5.0)
Alkaline Phosphatase: 152 U/L — ABNORMAL HIGH (ref 38–126)
Anion gap: 8 (ref 5–15)
BUN: 8 mg/dL (ref 6–20)
CO2: 19 mmol/L — ABNORMAL LOW (ref 22–32)
Calcium: 8.6 mg/dL — ABNORMAL LOW (ref 8.9–10.3)
Chloride: 93 mmol/L — ABNORMAL LOW (ref 98–111)
Creatinine, Ser: 0.58 mg/dL (ref 0.44–1.00)
GFR calc Af Amer: >60 mL/min (ref >60)
GFR calc non Af Amer: >60 mL/min (ref >60)
Glucose, Bld: 106 mg/dL — ABNORMAL HIGH (ref 70–99)
Potassium: 4.4 mmol/L (ref 3.5–5.1)
Sodium: 120 mmol/L — ABNORMAL LOW (ref 135–145)
Total Bilirubin: 7.2 mg/dL — ABNORMAL HIGH (ref 0.3–1.2)
Total Protein: 6.6 g/dL (ref 6.5–8.1)

## 2019-05-23 LAB — PROTIME-INR
INR: 1.3 — ABNORMAL HIGH (ref 0.8–1.2)
Prothrombin Time: 16.1 seconds — ABNORMAL HIGH (ref 11.4–15.2)

## 2019-05-23 MED ORDER — ALBUMIN HUMAN 25 % IV SOLN
50.0000 g | INTRAVENOUS | Status: DC
  Administered 2019-05-23: 50 g via INTRAVENOUS
  Filled 2019-05-23: qty 200

## 2019-05-23 MED ORDER — FUROSEMIDE 10 MG/ML IJ SOLN: 40.0000 mg | INTRAMUSCULAR | Status: DC

## 2019-05-23 MED ORDER — HEPARIN SOD (PORK) LOCK FLUSH 100 UNIT/ML IV SOLN: 500.0000 [IU] | Freq: Once | INTRAVENOUS | Status: AC

## 2019-05-23 MED ORDER — HEPARIN SOD (PORK) LOCK FLUSH 100 UNIT/ML IV SOLN
INTRAVENOUS | Status: AC
  Administered 2019-05-23: 500 [IU] via INTRAVENOUS
  Filled 2019-05-23: qty 5

## 2019-05-23 MED ORDER — FUROSEMIDE 10 MG/ML IJ SOLN
INTRAMUSCULAR | Status: AC
  Administered 2019-05-23: 40 mg via INTRAVENOUS
  Filled 2019-05-23: qty 4

## 2019-05-24 ENCOUNTER — Ambulatory Visit
Admit: 2019-05-24 | Discharge: 2019-05-24 | Disposition: A | Discharge status: Home / Self Care | Payer: PRIVATE HEALTH INSURANCE | Attending: Emergency Medicine

## 2019-05-24 ENCOUNTER — Emergency Department
Admit: 2019-05-24 | Discharge: 2019-05-24 | Disposition: A | Discharge status: Home / Self Care | Payer: PRIVATE HEALTH INSURANCE | Attending: Emergency Medicine

## 2019-05-24 DIAGNOSIS — E871 Hypo-osmolality and hyponatremia: Principal | ICD-10-CM

## 2019-05-24 DIAGNOSIS — Z20822 Contact with and (suspected) exposure to covid-19: Secondary | ICD-10-CM | POA: Diagnosis not present

## 2019-05-24 DIAGNOSIS — Z7982 Long term (current) use of aspirin: Secondary | ICD-10-CM | POA: Diagnosis not present

## 2019-05-24 DIAGNOSIS — I2510 Atherosclerotic heart disease of native coronary artery without angina pectoris: Secondary | ICD-10-CM | POA: Diagnosis not present

## 2019-05-24 DIAGNOSIS — K8309 Other cholangitis: Secondary | ICD-10-CM | POA: Diagnosis not present

## 2019-05-24 DIAGNOSIS — R079 Chest pain, unspecified: Secondary | ICD-10-CM | POA: Diagnosis not present

## 2019-05-24 DIAGNOSIS — R5383 Other fatigue: Secondary | ICD-10-CM | POA: Diagnosis not present

## 2019-05-24 DIAGNOSIS — Z881 Allergy status to other antibiotic agents status: Secondary | ICD-10-CM | POA: Diagnosis not present

## 2019-05-24 DIAGNOSIS — Z01818 Encounter for other preprocedural examination: Secondary | ICD-10-CM | POA: Diagnosis not present

## 2019-05-24 DIAGNOSIS — K7290 Hepatic failure, unspecified without coma: Secondary | ICD-10-CM | POA: Diagnosis not present

## 2019-05-24 DIAGNOSIS — Z79899 Other long term (current) drug therapy: Secondary | ICD-10-CM | POA: Diagnosis not present

## 2019-05-24 DIAGNOSIS — E785 Hyperlipidemia, unspecified: Secondary | ICD-10-CM | POA: Diagnosis not present

## 2019-05-30 ENCOUNTER — Other Ambulatory Visit: Payer: Self-pay

## 2019-05-30 ENCOUNTER — Encounter (HOSPITAL_COMMUNITY)
Admission: RE | Admit: 2019-05-30 | Discharge: 2019-05-30 | Disposition: A | Discharge status: Home / Self Care | Payer: 59 | Source: Ambulatory Visit | Attending: Transplant Hepatology | Admitting: Transplant Hepatology

## 2019-05-30 DIAGNOSIS — K7469 Other cirrhosis of liver: Secondary | ICD-10-CM | POA: Insufficient documentation

## 2019-05-30 LAB — PROTIME-INR
INR: 1.3 — ABNORMAL HIGH (ref 0.8–1.2)
Prothrombin Time: 15.7 seconds — ABNORMAL HIGH (ref 11.4–15.2)

## 2019-05-30 LAB — COMPREHENSIVE METABOLIC PANEL
ALT: 39 U/L (ref 0–44)
AST: 62 U/L — ABNORMAL HIGH (ref 15–41)
Albumin: 3.3 g/dL — ABNORMAL LOW (ref 3.5–5.0)
Alkaline Phosphatase: 132 U/L — ABNORMAL HIGH (ref 38–126)
Anion gap: 9 (ref 5–15)
BUN: 13 mg/dL (ref 6–20)
CO2: 20 mmol/L — ABNORMAL LOW (ref 22–32)
Calcium: 8.7 mg/dL — ABNORMAL LOW (ref 8.9–10.3)
Chloride: 100 mmol/L (ref 98–111)
Creatinine, Ser: 0.71 mg/dL (ref 0.44–1.00)
GFR calc Af Amer: >60 mL/min (ref >60)
GFR calc non Af Amer: >60 mL/min (ref >60)
Glucose, Bld: 167 mg/dL — ABNORMAL HIGH (ref 70–99)
Potassium: 3.9 mmol/L (ref 3.5–5.1)
Sodium: 129 mmol/L — ABNORMAL LOW (ref 135–145)
Total Bilirubin: 5.6 mg/dL — ABNORMAL HIGH (ref 0.3–1.2)
Total Protein: 6.7 g/dL (ref 6.5–8.1)

## 2019-05-30 MED ORDER — FUROSEMIDE 10 MG/ML IJ SOLN
INTRAMUSCULAR | Status: AC
  Administered 2019-05-30: 40 mg via INTRAVENOUS
  Filled 2019-05-30: qty 4

## 2019-05-30 MED ORDER — ALBUMIN HUMAN 25 % IV SOLN
50.0000 g | INTRAVENOUS | Status: DC
  Administered 2019-05-30: 50 g via INTRAVENOUS
  Filled 2019-05-30: qty 200

## 2019-05-30 MED ORDER — HEPARIN SOD (PORK) LOCK FLUSH 100 UNIT/ML IV SOLN
INTRAVENOUS | Status: AC
  Administered 2019-05-30: 13:00:00 500 [IU] via INTRAVENOUS
  Filled 2019-05-30: qty 5

## 2019-05-30 MED ORDER — HEPARIN SOD (PORK) LOCK FLUSH 100 UNIT/ML IV SOLN: 500.0000 [IU] | Freq: Once | INTRAVENOUS | Status: DC

## 2019-05-30 MED ORDER — FUROSEMIDE 10 MG/ML IJ SOLN: 40.0000 mg | INTRAMUSCULAR | Status: DC

## 2019-05-31 MED FILL — URSODIOL 300 MG CAPS: 300 | 90 days supply | Qty: 450 | Fill #2

## 2019-05-31 MED FILL — METOPROLOL TARTRATE 25 MG T: 25 | 90 days supply | Qty: 90 | Fill #1

## 2019-05-31 MED FILL — SPIRONOLACTONE 100 MG TAB: 100 | 90 days supply | Qty: 90 | Fill #2

## 2019-05-31 MED FILL — FUROSEMIDE 40 MG TAB: 40 | 90 days supply | Qty: 90 | Fill #2

## 2019-06-06 ENCOUNTER — Other Ambulatory Visit: Payer: Self-pay

## 2019-06-06 ENCOUNTER — Encounter (HOSPITAL_COMMUNITY)
Admission: RE | Admit: 2019-06-06 | Discharge: 2019-06-06 | Disposition: A | Discharge status: Home / Self Care | Payer: 59 | Source: Ambulatory Visit | Attending: Transplant Hepatology | Admitting: Transplant Hepatology

## 2019-06-06 DIAGNOSIS — K7469 Other cirrhosis of liver: Secondary | ICD-10-CM | POA: Diagnosis not present

## 2019-06-06 LAB — PROTIME-INR
INR: 1.2 (ref 0.8–1.2)
Prothrombin Time: 15.5 seconds — ABNORMAL HIGH (ref 11.4–15.2)

## 2019-06-06 LAB — COMPREHENSIVE METABOLIC PANEL
ALT: 36 U/L (ref 0–44)
AST: 61 U/L — ABNORMAL HIGH (ref 15–41)
Albumin: 3.4 g/dL — ABNORMAL LOW (ref 3.5–5.0)
Alkaline Phosphatase: 140 U/L — ABNORMAL HIGH (ref 38–126)
Anion gap: 11 (ref 5–15)
BUN: 15 mg/dL (ref 6–20)
CO2: 21 mmol/L — ABNORMAL LOW (ref 22–32)
Calcium: 8.8 mg/dL — ABNORMAL LOW (ref 8.9–10.3)
Chloride: 93 mmol/L — ABNORMAL LOW (ref 98–111)
Creatinine, Ser: 0.78 mg/dL (ref 0.44–1.00)
GFR calc Af Amer: >60 mL/min (ref >60)
GFR calc non Af Amer: >60 mL/min (ref >60)
Glucose, Bld: 125 mg/dL — ABNORMAL HIGH (ref 70–99)
Potassium: 3.6 mmol/L (ref 3.5–5.1)
Sodium: 125 mmol/L — ABNORMAL LOW (ref 135–145)
Total Bilirubin: 6.8 mg/dL — ABNORMAL HIGH (ref 0.3–1.2)
Total Protein: 7 g/dL (ref 6.5–8.1)

## 2019-06-06 MED ORDER — HEPARIN SOD (PORK) LOCK FLUSH 100 UNIT/ML IV SOLN
INTRAVENOUS | Status: AC
  Administered 2019-06-06: 500 [IU]
  Filled 2019-06-06: qty 5

## 2019-06-06 MED ORDER — FUROSEMIDE 10 MG/ML IJ SOLN: 40.0000 mg | INTRAMUSCULAR | Status: DC

## 2019-06-06 MED ORDER — FUROSEMIDE 10 MG/ML IJ SOLN
INTRAMUSCULAR | Status: AC
  Administered 2019-06-06: 40 mg
  Filled 2019-06-06: qty 4

## 2019-06-06 MED ORDER — HEPARIN SOD (PORK) LOCK FLUSH 100 UNIT/ML IV SOLN: 500.0000 [IU] | Freq: Once | INTRAVENOUS | Status: DC

## 2019-06-06 MED ORDER — ALBUMIN HUMAN 25 % IV SOLN
50.0000 g | INTRAVENOUS | Status: DC
  Administered 2019-06-06: 13:00:00 50 g via INTRAVENOUS
  Filled 2019-06-06: qty 200

## 2019-06-13 ENCOUNTER — Other Ambulatory Visit: Payer: Self-pay

## 2019-06-13 ENCOUNTER — Encounter (HOSPITAL_COMMUNITY)
Admission: RE | Admit: 2019-06-13 | Discharge: 2019-06-13 | Disposition: A | Discharge status: Home / Self Care | Payer: 59 | Source: Ambulatory Visit | Attending: Transplant Hepatology | Admitting: Transplant Hepatology

## 2019-06-13 DIAGNOSIS — K7469 Other cirrhosis of liver: Secondary | ICD-10-CM | POA: Diagnosis not present

## 2019-06-13 LAB — COMPREHENSIVE METABOLIC PANEL
ALT: 35 U/L (ref 0–44)
AST: 54 U/L — ABNORMAL HIGH (ref 15–41)
Albumin: 3.1 g/dL — ABNORMAL LOW (ref 3.5–5.0)
Alkaline Phosphatase: 130 U/L — ABNORMAL HIGH (ref 38–126)
Anion gap: 13 (ref 5–15)
BUN: 26 mg/dL — ABNORMAL HIGH (ref 6–20)
CO2: 24 mmol/L (ref 22–32)
Calcium: 9 mg/dL (ref 8.9–10.3)
Chloride: 96 mmol/L — ABNORMAL LOW (ref 98–111)
Creatinine, Ser: 1.05 mg/dL — ABNORMAL HIGH (ref 0.44–1.00)
GFR calc Af Amer: >60 mL/min (ref >60)
GFR calc non Af Amer: >60 mL/min (ref >60)
Glucose, Bld: 128 mg/dL — ABNORMAL HIGH (ref 70–99)
Potassium: 4.2 mmol/L (ref 3.5–5.1)
Sodium: 133 mmol/L — ABNORMAL LOW (ref 135–145)
Total Bilirubin: 5.1 mg/dL — ABNORMAL HIGH (ref 0.3–1.2)
Total Protein: 6.3 g/dL — ABNORMAL LOW (ref 6.5–8.1)

## 2019-06-13 LAB — PROTIME-INR
INR: 1.3 — ABNORMAL HIGH (ref 0.8–1.2)
Prothrombin Time: 15.9 seconds — ABNORMAL HIGH (ref 11.4–15.2)

## 2019-06-13 MED ORDER — FUROSEMIDE 10 MG/ML IJ SOLN: 40.0000 mg | INTRAMUSCULAR | Status: DC

## 2019-06-13 MED ORDER — FUROSEMIDE 10 MG/ML IJ SOLN
INTRAMUSCULAR | Status: AC
  Administered 2019-06-13: 40 mg via INTRAVENOUS
  Filled 2019-06-13: qty 4

## 2019-06-13 MED ORDER — HEPARIN SOD (PORK) LOCK FLUSH 100 UNIT/ML IV SOLN
INTRAVENOUS | Status: AC
  Administered 2019-06-13: 500 [IU]
  Filled 2019-06-13: qty 5

## 2019-06-13 MED ORDER — ALBUMIN HUMAN 25 % IV SOLN
50.0000 g | INTRAVENOUS | Status: DC
  Administered 2019-06-13: 50 g via INTRAVENOUS
  Filled 2019-06-13: qty 200

## 2019-06-20 ENCOUNTER — Encounter (HOSPITAL_COMMUNITY): Payer: 59

## 2019-06-20 ENCOUNTER — Ambulatory Visit: Admit: 2019-06-20 | Discharge: 2019-06-21 | Payer: PRIVATE HEALTH INSURANCE

## 2019-06-20 DIAGNOSIS — R0781 Pleurodynia: Principal | ICD-10-CM

## 2019-06-20 DIAGNOSIS — K7290 Hepatic failure, unspecified without coma: Principal | ICD-10-CM

## 2019-06-20 DIAGNOSIS — M954 Acquired deformity of chest and rib: Secondary | ICD-10-CM | POA: Diagnosis not present

## 2019-06-20 MED ORDER — SPIRONOLACTONE 100 MG TABLET
ORAL_TABLET | Freq: Every day | ORAL | 3 refills | 45 days
Start: 2019-06-20 — End: 2020-06-19

## 2019-06-20 MED ORDER — TRAMADOL 50 MG TABLET
ORAL_TABLET | Freq: Three times a day (TID) | ORAL | 0 refills | 10 days | Status: CP | PRN
Start: 2019-06-20 — End: 2019-07-20

## 2019-06-20 MED ORDER — FUROSEMIDE 40 MG TABLET
ORAL_TABLET | Freq: Every day | ORAL | 3 refills | 90.00000 days
Start: 2019-06-20 — End: 2020-06-19

## 2019-06-20 MED FILL — traMADol HCL 50 MG TABS: 50 | 10 days supply | Qty: 30 | Fill #0

## 2019-06-21 ENCOUNTER — Other Ambulatory Visit: Payer: Self-pay

## 2019-06-21 ENCOUNTER — Ambulatory Visit (HOSPITAL_COMMUNITY)
Admission: RE | Admit: 2019-06-21 | Discharge: 2019-06-21 | Disposition: A | Discharge status: Home / Self Care | Payer: 59 | Source: Ambulatory Visit | Attending: Transplant Hepatology | Admitting: Transplant Hepatology

## 2019-06-21 DIAGNOSIS — Z01818 Encounter for other preprocedural examination: Principal | ICD-10-CM

## 2019-06-21 DIAGNOSIS — K7290 Hepatic failure, unspecified without coma: Principal | ICD-10-CM

## 2019-06-21 DIAGNOSIS — K7469 Other cirrhosis of liver: Secondary | ICD-10-CM | POA: Diagnosis not present

## 2019-06-21 LAB — COMPREHENSIVE METABOLIC PANEL
ALT: 42 U/L (ref 0–44)
AST: 71 U/L — ABNORMAL HIGH (ref 15–41)
Albumin: 3.5 g/dL (ref 3.5–5.0)
Alkaline Phosphatase: 147 U/L — ABNORMAL HIGH (ref 38–126)
Anion gap: 14 (ref 5–15)
BUN: 39 mg/dL — ABNORMAL HIGH (ref 6–20)
CO2: 21 mmol/L — ABNORMAL LOW (ref 22–32)
Calcium: 9.4 mg/dL (ref 8.9–10.3)
Chloride: 92 mmol/L — ABNORMAL LOW (ref 98–111)
Creatinine, Ser: 1.58 mg/dL — ABNORMAL HIGH (ref 0.44–1.00)
GFR calc Af Amer: 44 mL/min — ABNORMAL LOW (ref >60)
GFR calc non Af Amer: 38 mL/min — ABNORMAL LOW (ref >60)
Glucose, Bld: 122 mg/dL — ABNORMAL HIGH (ref 70–99)
Potassium: 4.7 mmol/L (ref 3.5–5.1)
Sodium: 127 mmol/L — ABNORMAL LOW (ref 135–145)
Total Bilirubin: 7.6 mg/dL — ABNORMAL HIGH (ref 0.3–1.2)
Total Protein: 7.2 g/dL (ref 6.5–8.1)

## 2019-06-21 LAB — PROTIME-INR
INR: 1.3 — ABNORMAL HIGH (ref 0.8–1.2)
Prothrombin Time: 15.4 seconds — ABNORMAL HIGH (ref 11.4–15.2)

## 2019-06-21 MED ORDER — FUROSEMIDE 10 MG/ML IJ SOLN
INTRAMUSCULAR | Status: AC
  Administered 2019-06-21: 40 mg via INTRAVENOUS
  Filled 2019-06-21: qty 4

## 2019-06-21 MED ORDER — FUROSEMIDE 10 MG/ML IJ SOLN: 40.0000 mg | INTRAMUSCULAR | Status: DC

## 2019-06-21 MED ORDER — ALBUMIN HUMAN 25 % IV SOLN
50.0000 g | INTRAVENOUS | Status: DC
  Administered 2019-06-21: 50 g via INTRAVENOUS
  Filled 2019-06-21: qty 200

## 2019-06-21 MED ORDER — HEPARIN SOD (PORK) LOCK FLUSH 100 UNIT/ML IV SOLN
INTRAVENOUS | Status: AC
  Administered 2019-06-21: 500 [IU]
  Filled 2019-06-21: qty 5

## 2019-06-28 ENCOUNTER — Other Ambulatory Visit: Payer: Self-pay

## 2019-06-28 ENCOUNTER — Ambulatory Visit (HOSPITAL_COMMUNITY)
Admission: RE | Admit: 2019-06-28 | Discharge: 2019-06-28 | Disposition: A | Discharge status: Home / Self Care | Payer: 59 | Source: Ambulatory Visit | Attending: Transplant Hepatology | Admitting: Transplant Hepatology

## 2019-06-28 DIAGNOSIS — K7460 Unspecified cirrhosis of liver: Secondary | ICD-10-CM | POA: Diagnosis not present

## 2019-06-28 DIAGNOSIS — K7290 Hepatic failure, unspecified without coma: Secondary | ICD-10-CM | POA: Diagnosis not present

## 2019-06-28 LAB — COMPREHENSIVE METABOLIC PANEL
ALT: 57 U/L — ABNORMAL HIGH (ref 0–44)
AST: 75 U/L — ABNORMAL HIGH (ref 15–41)
Albumin: 3.5 g/dL (ref 3.5–5.0)
Alkaline Phosphatase: 152 U/L — ABNORMAL HIGH (ref 38–126)
Anion gap: 13 (ref 5–15)
BUN: 28 mg/dL — ABNORMAL HIGH (ref 6–20)
CO2: 20 mmol/L — ABNORMAL LOW (ref 22–32)
Calcium: 9.1 mg/dL (ref 8.9–10.3)
Chloride: 90 mmol/L — ABNORMAL LOW (ref 98–111)
Creatinine, Ser: 1.15 mg/dL — ABNORMAL HIGH (ref 0.44–1.00)
GFR calc Af Amer: >60 mL/min (ref >60)
GFR calc non Af Amer: 56 mL/min — ABNORMAL LOW (ref >60)
Glucose, Bld: 127 mg/dL — ABNORMAL HIGH (ref 70–99)
Potassium: 5.3 mmol/L — ABNORMAL HIGH (ref 3.5–5.1)
Sodium: 123 mmol/L — ABNORMAL LOW (ref 135–145)
Total Bilirubin: 8.1 mg/dL — ABNORMAL HIGH (ref 0.3–1.2)
Total Protein: 7 g/dL (ref 6.5–8.1)

## 2019-06-28 LAB — PROTIME-INR
INR: 1.3 — ABNORMAL HIGH (ref 0.8–1.2)
Prothrombin Time: 16.1 seconds — ABNORMAL HIGH (ref 11.4–15.2)

## 2019-06-28 MED ORDER — HEPARIN SOD (PORK) LOCK FLUSH 100 UNIT/ML IV SOLN
INTRAVENOUS | Status: AC
  Administered 2019-06-28: 500 [IU]
  Filled 2019-06-28: qty 5

## 2019-06-28 MED ORDER — FUROSEMIDE 10 MG/ML IJ SOLN
INTRAMUSCULAR | Status: AC
  Administered 2019-06-28: 40 mg via INTRAVENOUS
  Filled 2019-06-28: qty 4

## 2019-06-28 MED ORDER — FUROSEMIDE 10 MG/ML IJ SOLN: 40.0000 mg | INTRAMUSCULAR | Status: DC

## 2019-06-28 MED ORDER — ALBUMIN HUMAN 25 % IV SOLN
50.0000 g | INTRAVENOUS | Status: DC
  Administered 2019-06-28: 50 g via INTRAVENOUS
  Filled 2019-06-28: qty 200

## 2019-07-01 DIAGNOSIS — K7290 Hepatic failure, unspecified without coma: Secondary | ICD-10-CM | POA: Diagnosis not present

## 2019-07-05 ENCOUNTER — Ambulatory Visit (HOSPITAL_COMMUNITY)
Admission: RE | Admit: 2019-07-05 | Discharge: 2019-07-05 | Disposition: A | Discharge status: Home / Self Care | Payer: 59 | Source: Ambulatory Visit | Attending: Transplant Hepatology | Admitting: Transplant Hepatology

## 2019-07-05 ENCOUNTER — Other Ambulatory Visit: Payer: Self-pay

## 2019-07-05 DIAGNOSIS — K7290 Hepatic failure, unspecified without coma: Secondary | ICD-10-CM | POA: Diagnosis not present

## 2019-07-05 LAB — COMPREHENSIVE METABOLIC PANEL
ALT: 44 U/L (ref 0–44)
AST: 66 U/L — ABNORMAL HIGH (ref 15–41)
Albumin: 3.4 g/dL — ABNORMAL LOW (ref 3.5–5.0)
Alkaline Phosphatase: 144 U/L — ABNORMAL HIGH (ref 38–126)
Anion gap: 11 (ref 5–15)
BUN: 12 mg/dL (ref 6–20)
CO2: 23 mmol/L (ref 22–32)
Calcium: 8.6 mg/dL — ABNORMAL LOW (ref 8.9–10.3)
Chloride: 93 mmol/L — ABNORMAL LOW (ref 98–111)
Creatinine, Ser: 0.71 mg/dL (ref 0.44–1.00)
GFR calc Af Amer: >60 mL/min (ref >60)
GFR calc non Af Amer: >60 mL/min (ref >60)
Glucose, Bld: 102 mg/dL — ABNORMAL HIGH (ref 70–99)
Potassium: 3.6 mmol/L (ref 3.5–5.1)
Sodium: 127 mmol/L — ABNORMAL LOW (ref 135–145)
Total Bilirubin: 7.9 mg/dL — ABNORMAL HIGH (ref 0.3–1.2)
Total Protein: 6.8 g/dL (ref 6.5–8.1)

## 2019-07-05 LAB — PROTIME-INR
INR: 1.3 — ABNORMAL HIGH (ref 0.8–1.2)
Prothrombin Time: 15.8 seconds — ABNORMAL HIGH (ref 11.4–15.2)

## 2019-07-05 MED ORDER — FUROSEMIDE 10 MG/ML IJ SOLN: 40.0000 mg | INTRAMUSCULAR | Status: DC

## 2019-07-05 MED ORDER — HEPARIN SOD (PORK) LOCK FLUSH 100 UNIT/ML IV SOLN
INTRAVENOUS | Status: AC
  Administered 2019-07-05: 500 [IU] via INTRAVENOUS
  Filled 2019-07-05: qty 5

## 2019-07-05 MED ORDER — ALBUMIN HUMAN 25 % IV SOLN
50.0000 g | INTRAVENOUS | Status: DC
  Administered 2019-07-05: 50 g via INTRAVENOUS
  Filled 2019-07-05: qty 200

## 2019-07-05 MED ORDER — FUROSEMIDE 10 MG/ML IJ SOLN
INTRAMUSCULAR | Status: AC
  Administered 2019-07-05: 40 mg via INTRAVENOUS
  Filled 2019-07-05: qty 4

## 2019-07-05 MED ORDER — HEPARIN SOD (PORK) LOCK FLUSH 100 UNIT/ML IV SOLN: 500.0000 [IU] | Freq: Once | INTRAVENOUS | Status: AC

## 2019-07-06 ENCOUNTER — Ambulatory Visit: Admit: 2019-07-06 | Discharge: 2019-07-07 | Payer: PRIVATE HEALTH INSURANCE

## 2019-07-06 DIAGNOSIS — K7290 Hepatic failure, unspecified without coma: Secondary | ICD-10-CM | POA: Diagnosis not present

## 2019-07-06 DIAGNOSIS — N83201 Unspecified ovarian cyst, right side: Secondary | ICD-10-CM | POA: Diagnosis not present

## 2019-07-06 DIAGNOSIS — Z01818 Encounter for other preprocedural examination: Secondary | ICD-10-CM | POA: Diagnosis not present

## 2019-07-06 DIAGNOSIS — N83291 Other ovarian cyst, right side: Secondary | ICD-10-CM | POA: Diagnosis not present

## 2019-07-07 ENCOUNTER — Ambulatory Visit: Payer: 59 | Admitting: Internal Medicine

## 2019-07-12 ENCOUNTER — Other Ambulatory Visit: Payer: Self-pay

## 2019-07-12 ENCOUNTER — Ambulatory Visit (HOSPITAL_COMMUNITY)
Admission: RE | Admit: 2019-07-12 | Discharge: 2019-07-12 | Disposition: A | Discharge status: Home / Self Care | Payer: 59 | Source: Ambulatory Visit | Attending: Transplant Hepatology | Admitting: Transplant Hepatology

## 2019-07-12 DIAGNOSIS — K7469 Other cirrhosis of liver: Secondary | ICD-10-CM | POA: Insufficient documentation

## 2019-07-12 LAB — COMPREHENSIVE METABOLIC PANEL
ALT: 34 U/L (ref 0–44)
AST: 57 U/L — ABNORMAL HIGH (ref 15–41)
Albumin: 3.6 g/dL (ref 3.5–5.0)
Alkaline Phosphatase: 151 U/L — ABNORMAL HIGH (ref 38–126)
Anion gap: 12 (ref 5–15)
BUN: 17 mg/dL (ref 6–20)
CO2: 22 mmol/L (ref 22–32)
Calcium: 9.3 mg/dL (ref 8.9–10.3)
Chloride: 97 mmol/L — ABNORMAL LOW (ref 98–111)
Creatinine, Ser: 0.72 mg/dL (ref 0.44–1.00)
GFR calc Af Amer: >60 mL/min (ref >60)
GFR calc non Af Amer: >60 mL/min (ref >60)
Glucose, Bld: 98 mg/dL (ref 70–99)
Potassium: 4.5 mmol/L (ref 3.5–5.1)
Sodium: 131 mmol/L — ABNORMAL LOW (ref 135–145)
Total Bilirubin: 8.3 mg/dL — ABNORMAL HIGH (ref 0.3–1.2)
Total Protein: 7.2 g/dL (ref 6.5–8.1)

## 2019-07-12 LAB — PROTIME-INR
INR: 1.3 — ABNORMAL HIGH (ref 0.8–1.2)
Prothrombin Time: 15.3 seconds — ABNORMAL HIGH (ref 11.4–15.2)

## 2019-07-12 MED ORDER — FUROSEMIDE 10 MG/ML IJ SOLN
INTRAMUSCULAR | Status: AC
  Administered 2019-07-12: 40 mg via INTRAVENOUS
  Filled 2019-07-12: qty 4

## 2019-07-12 MED ORDER — FUROSEMIDE 10 MG/ML IJ SOLN: 40.0000 mg | INTRAMUSCULAR | Status: DC

## 2019-07-12 MED ORDER — HEPARIN SOD (PORK) LOCK FLUSH 100 UNIT/ML IV SOLN: 500.0000 [IU] | INTRAVENOUS | Status: DC | PRN

## 2019-07-12 MED ORDER — ALBUMIN HUMAN 25 % IV SOLN
50.0000 g | INTRAVENOUS | Status: DC
  Administered 2019-07-12: 50 g via INTRAVENOUS
  Filled 2019-07-12: qty 200

## 2019-07-12 MED ORDER — HEPARIN SOD (PORK) LOCK FLUSH 100 UNIT/ML IV SOLN: 500.0000 [IU] | INTRAVENOUS | Status: DC

## 2019-07-12 MED ORDER — HEPARIN SOD (PORK) LOCK FLUSH 100 UNIT/ML IV SOLN
INTRAVENOUS | Status: AC
  Administered 2019-07-12: 500 [IU]
  Filled 2019-07-12: qty 5

## 2019-07-18 ENCOUNTER — Ambulatory Visit (HOSPITAL_COMMUNITY)
Admission: RE | Admit: 2019-07-18 | Discharge: 2019-07-18 | Disposition: A | Discharge status: Home / Self Care | Payer: 59 | Source: Ambulatory Visit | Attending: Transplant Hepatology | Admitting: Transplant Hepatology

## 2019-07-18 ENCOUNTER — Other Ambulatory Visit: Payer: Self-pay

## 2019-07-18 DIAGNOSIS — K7469 Other cirrhosis of liver: Secondary | ICD-10-CM | POA: Diagnosis not present

## 2019-07-18 LAB — COMPREHENSIVE METABOLIC PANEL
ALT: 48 U/L — ABNORMAL HIGH (ref 0–44)
AST: 79 U/L — ABNORMAL HIGH (ref 15–41)
Albumin: 3.7 g/dL (ref 3.5–5.0)
Alkaline Phosphatase: 140 U/L — ABNORMAL HIGH (ref 38–126)
Anion gap: 13 (ref 5–15)
BUN: 40 mg/dL — ABNORMAL HIGH (ref 6–20)
CO2: 21 mmol/L — ABNORMAL LOW (ref 22–32)
Calcium: 9.7 mg/dL (ref 8.9–10.3)
Chloride: 93 mmol/L — ABNORMAL LOW (ref 98–111)
Creatinine, Ser: 1.29 mg/dL — ABNORMAL HIGH (ref 0.44–1.00)
GFR calc Af Amer: 56 mL/min — ABNORMAL LOW (ref >60)
GFR calc non Af Amer: 49 mL/min — ABNORMAL LOW (ref >60)
Glucose, Bld: 149 mg/dL — ABNORMAL HIGH (ref 70–99)
Potassium: 5 mmol/L (ref 3.5–5.1)
Sodium: 127 mmol/L — ABNORMAL LOW (ref 135–145)
Total Bilirubin: 9.2 mg/dL — ABNORMAL HIGH (ref 0.3–1.2)
Total Protein: 7.1 g/dL (ref 6.5–8.1)

## 2019-07-18 LAB — PROTIME-INR
INR: 1.3 — ABNORMAL HIGH (ref 0.8–1.2)
Prothrombin Time: 15.9 seconds — ABNORMAL HIGH (ref 11.4–15.2)

## 2019-07-18 MED ORDER — HEPARIN SOD (PORK) LOCK FLUSH 100 UNIT/ML IV SOLN
INTRAVENOUS | Status: AC
  Administered 2019-07-18: 500 [IU]
  Filled 2019-07-18: qty 5

## 2019-07-18 MED ORDER — FUROSEMIDE 10 MG/ML IJ SOLN
INTRAMUSCULAR | Status: AC
  Administered 2019-07-18: 40 mg via INTRAVENOUS
  Filled 2019-07-18: qty 4

## 2019-07-18 MED ORDER — HEPARIN SOD (PORK) LOCK FLUSH 100 UNIT/ML IV SOLN: 500.0000 [IU] | INTRAVENOUS | Status: DC | PRN

## 2019-07-18 MED ORDER — HEPARIN SOD (PORK) LOCK FLUSH 100 UNIT/ML IV SOLN: 500.0000 [IU] | INTRAVENOUS | Status: DC

## 2019-07-18 MED ORDER — ALBUMIN HUMAN 25 % IV SOLN
50.0000 g | INTRAVENOUS | Status: DC
  Administered 2019-07-18: 50 g via INTRAVENOUS
  Filled 2019-07-18: qty 200

## 2019-07-18 MED ORDER — FUROSEMIDE 10 MG/ML IJ SOLN: 40.0000 mg | INTRAMUSCULAR | Status: DC

## 2019-07-19 ENCOUNTER — Encounter (HOSPITAL_COMMUNITY): Payer: 59

## 2019-07-19 ENCOUNTER — Encounter
Admit: 2019-07-19 | Discharge: 2019-07-19 | Payer: PRIVATE HEALTH INSURANCE | Attending: Nurse Practitioner | Primary: Nurse Practitioner

## 2019-07-19 ENCOUNTER — Ambulatory Visit: Admit: 2019-07-19 | Discharge: 2019-07-19 | Payer: PRIVATE HEALTH INSURANCE

## 2019-07-19 DIAGNOSIS — K3189 Other diseases of stomach and duodenum: Secondary | ICD-10-CM | POA: Diagnosis not present

## 2019-07-19 DIAGNOSIS — Z951 Presence of aortocoronary bypass graft: Secondary | ICD-10-CM | POA: Diagnosis not present

## 2019-07-19 DIAGNOSIS — I2510 Atherosclerotic heart disease of native coronary artery without angina pectoris: Secondary | ICD-10-CM | POA: Diagnosis not present

## 2019-07-19 DIAGNOSIS — K745 Biliary cirrhosis, unspecified: Secondary | ICD-10-CM | POA: Diagnosis not present

## 2019-07-19 DIAGNOSIS — K766 Portal hypertension: Secondary | ICD-10-CM | POA: Diagnosis not present

## 2019-07-19 DIAGNOSIS — I8500 Esophageal varices without bleeding: Secondary | ICD-10-CM | POA: Diagnosis not present

## 2019-07-19 DIAGNOSIS — M8580 Other specified disorders of bone density and structure, unspecified site: Secondary | ICD-10-CM | POA: Diagnosis not present

## 2019-07-19 DIAGNOSIS — I8510 Secondary esophageal varices without bleeding: Secondary | ICD-10-CM | POA: Diagnosis not present

## 2019-07-19 MED ORDER — TRAZODONE 50 MG TABLET
ORAL_TABLET | Freq: Every evening | ORAL | 0 refills | 30.00000 days | Status: CP
Start: 2019-07-19 — End: 2019-08-18

## 2019-07-19 MED FILL — traZODone HCL 50 MG TABS: 50 | 30 days supply | Qty: 30 | Fill #0

## 2019-07-26 ENCOUNTER — Other Ambulatory Visit: Payer: Self-pay

## 2019-07-26 ENCOUNTER — Encounter (HOSPITAL_COMMUNITY)
Admission: RE | Admit: 2019-07-26 | Discharge: 2019-07-26 | Disposition: A | Discharge status: Home / Self Care | Payer: 59 | Source: Ambulatory Visit | Attending: Transplant Hepatology | Admitting: Transplant Hepatology

## 2019-07-26 DIAGNOSIS — K7469 Other cirrhosis of liver: Secondary | ICD-10-CM | POA: Diagnosis not present

## 2019-07-26 LAB — COMPREHENSIVE METABOLIC PANEL
ALT: 76 U/L — ABNORMAL HIGH (ref 0–44)
AST: 99 U/L — ABNORMAL HIGH (ref 15–41)
Albumin: 3.5 g/dL (ref 3.5–5.0)
Alkaline Phosphatase: 151 U/L — ABNORMAL HIGH (ref 38–126)
Anion gap: 11 (ref 5–15)
BUN: 17 mg/dL (ref 6–20)
CO2: 20 mmol/L — ABNORMAL LOW (ref 22–32)
Calcium: 9 mg/dL (ref 8.9–10.3)
Chloride: 89 mmol/L — ABNORMAL LOW (ref 98–111)
Creatinine, Ser: 0.99 mg/dL (ref 0.44–1.00)
GFR calc Af Amer: >60 mL/min (ref >60)
GFR calc non Af Amer: >60 mL/min (ref >60)
Glucose, Bld: 113 mg/dL — ABNORMAL HIGH (ref 70–99)
Potassium: 4.6 mmol/L (ref 3.5–5.1)
Sodium: 120 mmol/L — ABNORMAL LOW (ref 135–145)
Total Bilirubin: 9.7 mg/dL — ABNORMAL HIGH (ref 0.3–1.2)
Total Protein: 6.8 g/dL (ref 6.5–8.1)

## 2019-07-26 LAB — PROTIME-INR
INR: 1.3 — ABNORMAL HIGH (ref 0.8–1.2)
Prothrombin Time: 16.1 seconds — ABNORMAL HIGH (ref 11.4–15.2)

## 2019-07-26 MED ORDER — HEPARIN SOD (PORK) LOCK FLUSH 100 UNIT/ML IV SOLN: 500.0000 [IU] | INTRAVENOUS | Status: DC

## 2019-07-26 MED ORDER — HEPARIN SOD (PORK) LOCK FLUSH 100 UNIT/ML IV SOLN
INTRAVENOUS | Status: AC
  Filled 2019-07-26: qty 5

## 2019-07-26 MED ORDER — HEPARIN SOD (PORK) LOCK FLUSH 100 UNIT/ML IV SOLN
500.0000 [IU] | INTRAVENOUS | Status: DC | PRN
  Administered 2019-07-26: 500 [IU]

## 2019-07-26 MED ORDER — FUROSEMIDE 10 MG/ML IJ SOLN
40.0000 mg | INTRAMUSCULAR | Status: DC
  Administered 2019-07-26: 40 mg via INTRAVENOUS

## 2019-07-26 MED ORDER — FUROSEMIDE 10 MG/ML IJ SOLN
INTRAMUSCULAR | Status: AC
  Filled 2019-07-26: qty 4

## 2019-07-26 MED ORDER — ALBUMIN HUMAN 25 % IV SOLN
50.0000 g | INTRAVENOUS | Status: DC
  Administered 2019-07-26: 50 g via INTRAVENOUS
  Filled 2019-07-26: qty 200

## 2019-07-26 MED FILL — LIDOCAINE-PRILOCAINE CREAM: 2.5-2.5 | 28 days supply | Qty: 30 | Fill #1

## 2019-07-26 MED FILL — HYDROCORTISONE 2.5% OINT: 2.5 | 21 days supply | Qty: 57 | Fill #0

## 2019-07-28 MED ORDER — FUROSEMIDE 40 MG TABLET
ORAL_TABLET | Freq: Every day | ORAL | 3 refills | 90.00000 days
Start: 2019-07-28 — End: 2020-07-27

## 2019-07-28 MED ORDER — MIDODRINE 5 MG TABLET
ORAL_TABLET | Freq: Three times a day (TID) | ORAL | 11 refills | 30.00000 days | Status: CP
Start: 2019-07-28 — End: 2019-08-27

## 2019-07-28 MED FILL — MIDODRINE HCL 5 MG TABS: 5 | 30 days supply | Qty: 90 | Fill #0

## 2019-08-01 ENCOUNTER — Other Ambulatory Visit (HOSPITAL_COMMUNITY): Payer: Self-pay | Admitting: *Deleted

## 2019-08-02 ENCOUNTER — Other Ambulatory Visit: Payer: Self-pay

## 2019-08-02 ENCOUNTER — Ambulatory Visit (HOSPITAL_COMMUNITY)
Admission: RE | Admit: 2019-08-02 | Discharge: 2019-08-02 | Disposition: A | Discharge status: Home / Self Care | Payer: 59 | Source: Ambulatory Visit | Attending: Transplant Hepatology | Admitting: Transplant Hepatology

## 2019-08-02 DIAGNOSIS — K7469 Other cirrhosis of liver: Secondary | ICD-10-CM | POA: Insufficient documentation

## 2019-08-02 LAB — COMPREHENSIVE METABOLIC PANEL
ALT: 48 U/L — ABNORMAL HIGH (ref 0–44)
AST: 59 U/L — ABNORMAL HIGH (ref 15–41)
Albumin: 3.5 g/dL (ref 3.5–5.0)
Alkaline Phosphatase: 138 U/L — ABNORMAL HIGH (ref 38–126)
Anion gap: 12 (ref 5–15)
BUN: 13 mg/dL (ref 6–20)
CO2: 19 mmol/L — ABNORMAL LOW (ref 22–32)
Calcium: 9.1 mg/dL (ref 8.9–10.3)
Chloride: 87 mmol/L — ABNORMAL LOW (ref 98–111)
Creatinine, Ser: 0.7 mg/dL (ref 0.44–1.00)
GFR calc Af Amer: >60 mL/min (ref >60)
GFR calc non Af Amer: >60 mL/min (ref >60)
Glucose, Bld: 110 mg/dL — ABNORMAL HIGH (ref 70–99)
Potassium: 4.3 mmol/L (ref 3.5–5.1)
Sodium: 118 mmol/L — CL (ref 135–145)
Total Bilirubin: 8.3 mg/dL — ABNORMAL HIGH (ref 0.3–1.2)
Total Protein: 7 g/dL (ref 6.5–8.1)

## 2019-08-02 LAB — PROTIME-INR
INR: 1.4 — ABNORMAL HIGH (ref 0.8–1.2)
Prothrombin Time: 16.4 seconds — ABNORMAL HIGH (ref 11.4–15.2)

## 2019-08-02 MED ORDER — FUROSEMIDE 10 MG/ML IJ SOLN
INTRAMUSCULAR | Status: AC
  Administered 2019-08-02: 40 mg via INTRAVENOUS
  Filled 2019-08-02: qty 4

## 2019-08-02 MED ORDER — HEPARIN SOD (PORK) LOCK FLUSH 100 UNIT/ML IV SOLN
INTRAVENOUS | Status: AC
  Administered 2019-08-02: 500 [IU]
  Filled 2019-08-02: qty 5

## 2019-08-02 MED ORDER — HEPARIN SOD (PORK) LOCK FLUSH 100 UNIT/ML IV SOLN: 500.0000 [IU] | INTRAVENOUS | Status: DC | PRN

## 2019-08-02 MED ORDER — HEPARIN SOD (PORK) LOCK FLUSH 100 UNIT/ML IV SOLN: 500.0000 [IU] | INTRAVENOUS | Status: DC

## 2019-08-02 MED ORDER — FUROSEMIDE 10 MG/ML IJ SOLN: 40.0000 mg | INTRAMUSCULAR | Status: DC

## 2019-08-02 MED ORDER — ALBUMIN HUMAN 25 % IV SOLN
50.0000 g | INTRAVENOUS | Status: DC
  Administered 2019-08-02: 50 g via INTRAVENOUS
  Filled 2019-08-02: qty 200

## 2019-08-03 DIAGNOSIS — K7290 Hepatic failure, unspecified without coma: Secondary | ICD-10-CM | POA: Diagnosis not present

## 2019-08-03 DIAGNOSIS — E7849 Other hyperlipidemia: Secondary | ICD-10-CM

## 2019-08-03 DIAGNOSIS — Z951 Presence of aortocoronary bypass graft: Secondary | ICD-10-CM

## 2019-08-03 DIAGNOSIS — E785 Hyperlipidemia, unspecified: Secondary | ICD-10-CM

## 2019-08-03 MED ORDER — PRALUENT 150 MG/ML ~~LOC~~ SOAJ: 1.0000 | SUBCUTANEOUS | 11 refills | Status: DC

## 2019-08-03 NOTE — Telephone Encounter (Signed)
Your call.Marland KitchenMarland Kitchen

## 2019-08-04 LAB — COPPER, SERUM: Copper: 145 ug/dL (ref 80–158)

## 2019-08-04 LAB — ZINC: Zinc: 107 ug/dL (ref 44–115)

## 2019-08-06 ENCOUNTER — Emergency Department (HOSPITAL_COMMUNITY)
Admission: EM | Admit: 2019-08-06 | Discharge: 2019-08-07 | Disposition: A | Discharge status: Other Acute Inpatient Hospital | Payer: 59 | Attending: Emergency Medicine | Admitting: Emergency Medicine

## 2019-08-06 ENCOUNTER — Encounter (HOSPITAL_COMMUNITY): Payer: Self-pay | Admitting: *Deleted

## 2019-08-06 ENCOUNTER — Emergency Department (HOSPITAL_COMMUNITY): Payer: 59

## 2019-08-06 ENCOUNTER — Other Ambulatory Visit: Payer: Self-pay

## 2019-08-06 DIAGNOSIS — E871 Hypo-osmolality and hyponatremia: Secondary | ICD-10-CM | POA: Insufficient documentation

## 2019-08-06 DIAGNOSIS — Z7982 Long term (current) use of aspirin: Secondary | ICD-10-CM | POA: Diagnosis not present

## 2019-08-06 DIAGNOSIS — D649 Anemia, unspecified: Secondary | ICD-10-CM | POA: Diagnosis not present

## 2019-08-06 DIAGNOSIS — Z20822 Contact with and (suspected) exposure to covid-19: Secondary | ICD-10-CM | POA: Diagnosis not present

## 2019-08-06 DIAGNOSIS — Z951 Presence of aortocoronary bypass graft: Secondary | ICD-10-CM | POA: Diagnosis not present

## 2019-08-06 DIAGNOSIS — R0602 Shortness of breath: Secondary | ICD-10-CM

## 2019-08-06 DIAGNOSIS — I2510 Atherosclerotic heart disease of native coronary artery without angina pectoris: Secondary | ICD-10-CM | POA: Diagnosis not present

## 2019-08-06 DIAGNOSIS — K7460 Unspecified cirrhosis of liver: Secondary | ICD-10-CM | POA: Diagnosis not present

## 2019-08-06 DIAGNOSIS — I517 Cardiomegaly: Secondary | ICD-10-CM | POA: Diagnosis not present

## 2019-08-06 NOTE — ED Triage Notes (Signed)
Pt reports 25 weight gain in 1.5 weeks. Increased SOB. Lasix dose was reduced earlier in the week d/t decreased sodium levels. She is end stage liver disease, awaiting transplant

## 2019-08-07 ENCOUNTER — Ambulatory Visit
Admit: 2019-08-07 | Discharge: 2019-08-13 | Disposition: A | Discharge status: Home / Self Care | Payer: PRIVATE HEALTH INSURANCE | Source: Other Acute Inpatient Hospital

## 2019-08-07 DIAGNOSIS — R5383 Other fatigue: Secondary | ICD-10-CM | POA: Diagnosis not present

## 2019-08-07 DIAGNOSIS — I8510 Secondary esophageal varices without bleeding: Secondary | ICD-10-CM | POA: Diagnosis not present

## 2019-08-07 DIAGNOSIS — R0989 Other specified symptoms and signs involving the circulatory and respiratory systems: Secondary | ICD-10-CM | POA: Diagnosis not present

## 2019-08-07 DIAGNOSIS — G9349 Other encephalopathy: Secondary | ICD-10-CM | POA: Diagnosis not present

## 2019-08-07 DIAGNOSIS — R0609 Other forms of dyspnea: Secondary | ICD-10-CM | POA: Diagnosis not present

## 2019-08-07 DIAGNOSIS — K7469 Other cirrhosis of liver: Secondary | ICD-10-CM | POA: Diagnosis not present

## 2019-08-07 DIAGNOSIS — K7460 Unspecified cirrhosis of liver: Secondary | ICD-10-CM | POA: Diagnosis not present

## 2019-08-07 DIAGNOSIS — I5030 Unspecified diastolic (congestive) heart failure: Secondary | ICD-10-CM | POA: Diagnosis not present

## 2019-08-07 DIAGNOSIS — K743 Primary biliary cirrhosis: Secondary | ICD-10-CM | POA: Diagnosis not present

## 2019-08-07 DIAGNOSIS — K8309 Other cholangitis: Secondary | ICD-10-CM | POA: Diagnosis not present

## 2019-08-07 DIAGNOSIS — Z452 Encounter for adjustment and management of vascular access device: Secondary | ICD-10-CM | POA: Diagnosis not present

## 2019-08-07 DIAGNOSIS — E8779 Other fluid overload: Secondary | ICD-10-CM | POA: Diagnosis not present

## 2019-08-07 DIAGNOSIS — K7290 Hepatic failure, unspecified without coma: Secondary | ICD-10-CM | POA: Diagnosis not present

## 2019-08-07 DIAGNOSIS — D649 Anemia, unspecified: Secondary | ICD-10-CM | POA: Diagnosis not present

## 2019-08-07 DIAGNOSIS — E871 Hypo-osmolality and hyponatremia: Secondary | ICD-10-CM | POA: Diagnosis not present

## 2019-08-07 DIAGNOSIS — Z20822 Contact with and (suspected) exposure to covid-19: Secondary | ICD-10-CM | POA: Diagnosis not present

## 2019-08-07 DIAGNOSIS — I2510 Atherosclerotic heart disease of native coronary artery without angina pectoris: Secondary | ICD-10-CM | POA: Diagnosis not present

## 2019-08-07 DIAGNOSIS — K8301 Primary sclerosing cholangitis: Secondary | ICD-10-CM | POA: Diagnosis not present

## 2019-08-07 DIAGNOSIS — R0602 Shortness of breath: Secondary | ICD-10-CM | POA: Diagnosis not present

## 2019-08-07 DIAGNOSIS — Z7982 Long term (current) use of aspirin: Secondary | ICD-10-CM | POA: Diagnosis not present

## 2019-08-07 DIAGNOSIS — R188 Other ascites: Secondary | ICD-10-CM | POA: Diagnosis not present

## 2019-08-07 DIAGNOSIS — E8770 Fluid overload, unspecified: Secondary | ICD-10-CM | POA: Diagnosis not present

## 2019-08-07 DIAGNOSIS — I272 Pulmonary hypertension, unspecified: Secondary | ICD-10-CM | POA: Diagnosis not present

## 2019-08-07 DIAGNOSIS — I499 Cardiac arrhythmia, unspecified: Secondary | ICD-10-CM | POA: Diagnosis not present

## 2019-08-07 DIAGNOSIS — I5031 Acute diastolic (congestive) heart failure: Secondary | ICD-10-CM | POA: Diagnosis not present

## 2019-08-07 DIAGNOSIS — Z951 Presence of aortocoronary bypass graft: Secondary | ICD-10-CM | POA: Diagnosis not present

## 2019-08-07 LAB — URINALYSIS, ROUTINE W REFLEX MICROSCOPIC
Bacteria, UA: NONE SEEN
Bilirubin Urine: NEGATIVE
Glucose, UA: NEGATIVE mg/dL
Hgb urine dipstick: NEGATIVE
Ketones, ur: NEGATIVE mg/dL
Leukocytes,Ua: NEGATIVE
Nitrite: NEGATIVE
Protein, ur: 30 mg/dL — AB
Specific Gravity, Urine: 1.018 (ref 1.005–1.030)
pH: 5 (ref 5.0–8.0)

## 2019-08-07 LAB — COMPREHENSIVE METABOLIC PANEL
ALT: 39 U/L (ref 0–44)
AST: 56 U/L — ABNORMAL HIGH (ref 15–41)
Albumin: 3.9 g/dL (ref 3.5–5.0)
Alkaline Phosphatase: 121 U/L (ref 38–126)
Anion gap: 11 (ref 5–15)
BUN: 21 mg/dL — ABNORMAL HIGH (ref 6–20)
CO2: 20 mmol/L — ABNORMAL LOW (ref 22–32)
Calcium: 8.9 mg/dL (ref 8.9–10.3)
Chloride: 86 mmol/L — ABNORMAL LOW (ref 98–111)
Creatinine, Ser: 0.71 mg/dL (ref 0.44–1.00)
GFR calc Af Amer: >60 mL/min (ref >60)
GFR calc non Af Amer: >60 mL/min (ref >60)
Glucose, Bld: 99 mg/dL (ref 70–99)
Potassium: 5 mmol/L (ref 3.5–5.1)
Sodium: 117 mmol/L — CL (ref 135–145)
Total Bilirubin: 7.6 mg/dL — ABNORMAL HIGH (ref 0.3–1.2)
Total Protein: 7.1 g/dL (ref 6.5–8.1)

## 2019-08-07 LAB — CBC WITH DIFFERENTIAL/PLATELET
Abs Immature Granulocytes: 0.07 10*3/uL (ref 0.00–0.07)
Basophils Absolute: 0 10*3/uL (ref 0.0–0.1)
Basophils Relative: 0 %
Eosinophils Absolute: 0.2 10*3/uL (ref 0.0–0.5)
Eosinophils Relative: 3 %
HCT: 22.2 % — ABNORMAL LOW (ref 36.0–46.0)
Hemoglobin: 7.7 g/dL — ABNORMAL LOW (ref 12.0–15.0)
Immature Granulocytes: 1 %
Lymphocytes Relative: 9 %
Lymphs Abs: 0.5 10*3/uL — ABNORMAL LOW (ref 0.7–4.0)
MCH: 33.8 pg (ref 26.0–34.0)
MCHC: 34.7 g/dL (ref 30.0–36.0)
MCV: 97.4 fL (ref 80.0–100.0)
Monocytes Absolute: 0.7 10*3/uL (ref 0.1–1.0)
Monocytes Relative: 12 %
Neutro Abs: 4 10*3/uL (ref 1.7–7.7)
Neutrophils Relative %: 75 %
Platelets: 81 10*3/uL — ABNORMAL LOW (ref 150–400)
RBC: 2.28 MIL/uL — ABNORMAL LOW (ref 3.87–5.11)
RDW: 15.3 % (ref 11.5–15.5)
WBC: 5.4 10*3/uL (ref 4.0–10.5)
nRBC: 0 % (ref 0.0–0.2)

## 2019-08-07 LAB — SARS CORONAVIRUS 2 BY RT PCR (HOSPITAL ORDER, PERFORMED IN ~~LOC~~ HOSPITAL LAB): SARS Coronavirus 2: NEGATIVE

## 2019-08-07 MED ORDER — ONDANSETRON HCL 4 MG/2ML IJ SOLN
4.0000 mg | Freq: Once | INTRAMUSCULAR | Status: AC
  Administered 2019-08-07: 4 mg via INTRAVENOUS
  Filled 2019-08-07: qty 2

## 2019-08-07 NOTE — ED Notes (Signed)
Report attempted at this time by this RN; pt has not been assigned a room yet; [facility] updated of pt covid results at this time. Pt and family updated at this time as well.

## 2019-08-07 NOTE — ED Provider Notes (Addendum)
[facility] DEPT Provider Note: Georgena Spurling, MD, FACEP  CSN: [id number] MRN: [id number] ARRIVAL: 08/06/19 at 2242 ROOM: [place]   HISTORY OF PRESENT ILLNESS  08/07/19 12:04 AM Christine Butler is a 49 y.o. female with end-stage liver disease (primary biliary cirrhosis) on a liver transplant list.  She is here with a 1 week history of increased shortness of breath and increased weight.  She estimates she has gained 25 pounds in the past week.  She had her scheduled infusion of albumin 5 days ago and had her Lasix dose temporarily decreased due to a sodium of 118.  Her sodium on last check was 121 and her Lasix dose was reinstated at 100 mg daily.  She is also on spironolactone daily.  Her shortness of breath is moderate at rest but worse with lying supine or with exertion.  She has had no chest pain.  She has some mild abdominal pain and mild abdominal swelling but no significant ascites. She has only required paracentesis one time.   Past Medical History:  Diagnosis Date  . Arthritis    right knee  . Ascites--mild this admit 11/06/2017   per patient , now stable with meds and lifestlye adjustment  . CAD (coronary artery disease)    a. s/p CABGx2 in 02/2017 (LAD not suitable for PCI), EF normal.  . Familial hyperlipidemia   . GERD (gastroesophageal reflux disease)   . IBS (irritable bowel syndrome)   . PONV (postoperative nausea and vomiting)    i always throw up on waking up , but last EGD in march had no issues    . Primary biliary cirrhosis (HCC)    cirhosis/liver disease followed by transplant team led by Dr Manuella Ghazi at [facility]   . S/P CABG (coronary artery bypass graft)   . SVD (spontaneous vaginal delivery)    x 3  . Urinary tract bacterial infections     Past Surgical History:  Procedure Laterality Date  . CORONARY ARTERY BYPASS GRAFT N/A 03/24/2017   Procedure: CORONARY ARTERY BYPASS GRAFTING (CABG) x two, using left internal  mammary artery, left radial artery, and right leg greater saphenous vein harvested endoscopically;  Surgeon: Ivin Poot, MD;  Location: [facility];  Service: Open Heart Surgery;  Laterality: N/A;  . ENDOVEIN HARVEST OF GREATER SAPHENOUS VEIN Right 03/24/2017   Procedure: ENDOVEIN HARVEST OF GREATER SAPHENOUS VEIN;  Surgeon: Ivin Poot, MD;  Location: [facility];  Service: Open Heart Surgery;  Laterality: Right;  . ESOPHAGEAL BANDING  02/01/2018   Procedure: ESOPHAGEAL BANDING;  Surgeon: [name], MD;  Location: [facility];  Service: Gastroenterology;;  . ESOPHAGEAL BANDING N/A 03/29/2018   Procedure: ESOPHAGEAL BANDING;  Surgeon: [name], MD;  Location: [facility];  Service: Gastroenterology;  Laterality: N/A;  . ESOPHAGEAL BANDING N/A 05/21/2018   Procedure: ESOPHAGEAL BANDING;  Surgeon: [name], MD;  Location: [facility];  Service: Gastroenterology;  Laterality: N/A;  . ESOPHAGEAL BANDING N/A 10/11/2018   Procedure: ESOPHAGEAL BANDING;  Surgeon: [name], MD;  [facility];  Service: Gastroenterology;  Laterality: N/A;  . ESOPHAGOGASTRODUODENOSCOPY N/A 03/29/2018   Procedure: ESOPHAGOGASTRODUODENOSCOPY (EGD);  Surgeon: [name], MD;  Location: [facility];  Service: Gastroenterology;  Laterality: N/A;  . ESOPHAGOGASTRODUODENOSCOPY N/A 05/21/2018   Procedure: ESOPHAGOGASTRODUODENOSCOPY (EGD);  Surgeon: [name], MD;  Location[facility];  Service: Gastroenterology;  Laterality: N/A;  . ESOPHAGOGASTRODUODENOSCOPY (EGD) WITH PROPOFOL N/A 02/01/2018   Procedure: ESOPHAGOGASTRODUODENOSCOPY (EGD) WITH  PROPOFOL;  Surgeon: [name], MD;  Location: [facility];  Service: Gastroenterology;  Laterality: N/A;  . ESOPHAGOGASTRODUODENOSCOPY (EGD) WITH PROPOFOL N/A 10/11/2018   Procedure: ESOPHAGOGASTRODUODENOSCOPY (EGD) WITH PROPOFOL;  Surgeon: [name], MD;  [facility];  Service: Gastroenterology;  Laterality: N/A;  . INCONTINENCE SURGERY    . IR IMAGING  GUIDED PORT INSERTION  04/08/2019  . LEEP N/A 03/28/2014   Procedure: LOOP ELECTROSURGICAL EXCISION PROCEDURE (LEEP) cone biopsy;  Surgeon: Cyril Mourning, MD;  Location: [facility];  Service: Gynecology;  Laterality: N/A;  . LEFT HEART CATH AND CORONARY ANGIOGRAPHY N/A 03/23/2017   Procedure: LEFT HEART CATH AND CORONARY ANGIOGRAPHY;  Surgeon: Jettie Booze, MD;  Location: [facility];  Service: Cardiovascular;  Laterality: N/A;  . LIVER BIOPSY     x 2  . RADIAL ARTERY HARVEST Left 03/24/2017   Procedure: RADIAL ARTERY HARVEST;  Surgeon: Ivin Poot, MD;  Location: [facility];  Service: Open Heart Surgery;  Laterality: Left;  . TEE WITHOUT CARDIOVERSION N/A 03/24/2017   Procedure: TRANSESOPHAGEAL ECHOCARDIOGRAM (TEE);  Surgeon: Prescott Gum, Collier Salina, MD;  Location: [facility];  Service: Open Heart Surgery;  Laterality: N/A;  . WISDOM TOOTH EXTRACTION      Family History  Problem Relation Age of Onset  . CAD Father   . Diabetes Mellitus II Father   . Heart disease Father   . Heart attack Brother   . Heart disease Maternal Aunt   . Heart attack Paternal Grandmother   . Heart attack Paternal Grandfather     Social History   Tobacco Use  . Smoking status: Never Smoker  . Smokeless tobacco: Never Used  Vaping Use  . Vaping Use: Never used  Substance Use Topics  . Alcohol use: Not Currently    Alcohol/week: 1.0 standard drink    Types: 1 Glasses of wine per week    Comment: for dinner  . Drug use: No    Prior to Admission medications   Medication Sig Start Date End Date Taking? Authorizing Provider  Alirocumab (PRALUENT) 150 MG/ML SOAJ Inject 1 Dose into the skin every 14 (fourteen) days. 08/03/19   Hilty, Nadean Corwin, MD  aspirin 81 MG EC tablet Take 81 mg by mouth daily. Swallow whole.    [provider]  calcium carbonate (TUMS - DOSED IN MG ELEMENTAL CALCIUM) 500 MG chewable tablet Chew 2 tablets by mouth daily.    [provider]  Calcium Citrate-Vitamin D  200-125 MG-UNIT TABS Take 1 tablet by mouth 2 (two) times daily.    [provider]  ciprofloxacin (CIPRO) 500 MG tablet Take 500 mg by mouth daily.    [provider]  furosemide (LASIX) 20 MG tablet Take 1 tablet (20 mg total) by mouth daily. Patient taking differently: Take 40 mg by mouth daily.  11/06/17   Isaiah Serge, NP  hydrOXYzine (ATARAX/VISTARIL) 25 MG tablet Take 25 mg by mouth at bedtime as needed for itching.     [provider]  lactulose (CHRONULAC) 10 GM/15ML solution Take 30 g by mouth 3 (three) times daily. 30 gm total 3 times a day.    [provider]  Melatonin 3 MG CAPS Take 6 mg by mouth at bedtime as needed (for sleep).    [provider]  metoprolol tartrate (LOPRESSOR) 25 MG tablet Take 0.5 tablets (12.5 mg total) by mouth 2 (two) times daily. 09/20/18   Revankar, Reita Cliche, MD  metroNIDAZOLE (METROCREAM) 0.75 % cream Apply  1 application topically 2 (two) times daily.    [provider]  Multiple Vitamins-Minerals (DEKAS PLUS PO) Take 1 capsule by mouth daily.    [provider]  nitroGLYCERIN (NITROSTAT) 0.4 MG SL tablet Place 1 tablet (0.4 mg total) under the tongue every 5 (five) minutes as needed for chest pain. 05/12/17 05/10/19  Ward, Ozella Almond, PA-C  ondansetron (ZOFRAN ODT) 4 MG disintegrating tablet Take 1 tablet (4 mg total) by mouth every 8 (eight) hours as needed for nausea or vomiting. 01/26/19   Lucrezia Starch, MD  Probiotic Product (PROBIOTIC PO) Take 1 capsule by mouth daily.     [provider]  spironolactone (ALDACTONE) 50 MG tablet Take 100 mg by mouth daily.  08/24/17   [provider]  triamcinolone cream (KENALOG) 0.1 % Apply 1 application topically 2 (two) times daily.    [provider]  ursodiol (ACTIGALL) 300 MG capsule Take 300 mg by mouth 3 (three) times daily.     [provider]    Allergies Codeine, Erythromycin, and Fentanyl   REVIEW OF  SYSTEMS  Negative except as noted here or in the History of Present Illness.   PHYSICAL EXAMINATION  Initial Vital Signs Blood pressure (!) 111/55, pulse 77, temperature 98.4 F (36.9 C), resp. rate 16, SpO2 100 %.  Examination General: Well-developed, well-nourished female in no acute distress; appearance consistent with age of record HENT: normocephalic; atraumatic Eyes: pupils equal, round and reactive to light; extraocular muscles intact; scleral icterus Neck: supple Heart: regular rate and rhythm; harsh holosystolic murmur at left upper sternal border Lungs: clear to auscultation bilaterally Abdomen: soft; nondistended; mild right upper quadrant tenderness; bowel sounds present Extremities: No deformity; full range of motion; pulses normal; no edema Neurologic: Awake, alert and oriented; motor function intact in all extremities and symmetric; no facial droop Skin: Warm and dry Psychiatric: Normal mood and affect   RESULTS  Summary of this visit's results, reviewed and interpreted by myself:   EKG Interpretation  Date/Time:  Saturday August 06 2019 23:19:09 EDT Ventricular Rate:  79 PR Interval:    QRS Duration: 111 QT Interval:  374 QTC Calculation: 429 R Axis:   89 Text Interpretation: Sinus rhythm Borderline low voltage, extremity leads No significant change was found Confirmed by Shanon Rosser [phone]) on 08/07/2019 12:03:56 AM      Laboratory Studies: Results for orders placed or performed during the hospital encounter of 08/06/19 (from the past 24 hour(s))  CBC with Differential/Platelet     Status: Abnormal   Collection Time: 08/07/19 12:23 AM  Result Value Ref Range   WBC 5.4 4.0 - 10.5 K/uL   RBC 2.28 (L) 3.87 - 5.11 MIL/uL   Hemoglobin 7.7 (L) 12.0 - 15.0 g/dL   HCT 22.2 (L) 36 - 46 %   MCV 97.4 80.0 - 100.0 fL   MCH 33.8 26.0 - 34.0 pg   MCHC 34.7 30.0 - 36.0 g/dL   RDW 15.3 11.5 - 15.5 %   Platelets 81 (L) 150 - 400 K/uL   nRBC 0.0 0.0 - 0.2 %    Neutrophils Relative % 75 %   Neutro Abs 4.0 1.7 - 7.7 K/uL   Lymphocytes Relative 9 %   Lymphs Abs 0.5 (L) 0.7 - 4.0 K/uL   Monocytes Relative 12 %   Monocytes Absolute 0.7 0 - 1 K/uL   Eosinophils Relative 3 %   Eosinophils Absolute 0.2 0 - 0 K/uL   Basophils Relative 0 %  Basophils Absolute 0.0 0 - 0 K/uL   Immature Granulocytes 1 %   Abs Immature Granulocytes 0.07 0.00 - 0.07 K/uL  Comprehensive metabolic panel     Status: Abnormal   Collection Time: 08/07/19 12:23 AM  Result Value Ref Range   Sodium 117 (LL) 135 - 145 mmol/L   Potassium 5.0 3.5 - 5.1 mmol/L   Chloride 86 (L) 98 - 111 mmol/L   CO2 20 (L) 22 - 32 mmol/L   Glucose, Bld 99 70 - 99 mg/dL   BUN 21 (H) 6 - 20 mg/dL   Creatinine, Ser 0.71 0.44 - 1.00 mg/dL   Calcium 8.9 8.9 - 10.3 mg/dL   Total Protein 7.1 6.5 - 8.1 g/dL   Albumin 3.9 3.5 - 5.0 g/dL   AST 56 (H) 15 - 41 U/L   ALT 39 0 - 44 U/L   Alkaline Phosphatase 121 38 - 126 U/L   Total Bilirubin 7.6 (H) 0.3 - 1.2 mg/dL   GFR calc non Af Amer >60 >60 mL/min   GFR calc Af Amer >60 >60 mL/min   Anion gap 11 5 - 15  Urinalysis, Routine w reflex microscopic     Status: Abnormal   Collection Time: 08/07/19  2:20 AM  Result Value Ref Range   Color, Urine AMBER (A) YELLOW   APPearance CLEAR CLEAR   Specific Gravity, Urine 1.018 1.005 - 1.030   pH 5.0 5.0 - 8.0   Glucose, UA NEGATIVE NEGATIVE mg/dL   Hgb urine dipstick NEGATIVE NEGATIVE   Bilirubin Urine NEGATIVE NEGATIVE   Ketones, ur NEGATIVE NEGATIVE mg/dL   Protein, ur 30 (A) NEGATIVE mg/dL   Nitrite NEGATIVE NEGATIVE   Leukocytes,Ua NEGATIVE NEGATIVE   RBC / HPF 0-5 0 - 5 RBC/hpf   WBC, UA 0-5 0 - 5 WBC/hpf   Bacteria, UA NONE SEEN NONE SEEN   Squamous Epithelial / LPF 0-5 0 - 5   Imaging Studies: DG Chest 2 View  Result Date: 08/06/2019 CLINICAL DATA:  Weight gain.  Shortness of breath. EXAM: CHEST - 2 VIEW COMPARISON:  11/05/2017 FINDINGS: Right Port-A-Cath in place with the tip in the right  atrium. Prior CABG. Mild cardiomegaly. No confluent airspace opacities, effusions or edema. No acute bony abnormality. IMPRESSION: Cardiomegaly.  No active disease. Electronically Signed   By: Rolm Baptise M.D.   On: 08/06/2019 23:36    ED COURSE and MDM  Nursing notes, initial and subsequent vitals signs, including pulse oximetry, reviewed and interpreted by myself.  Vitals:   08/06/19 2259 08/07/19 0115 08/07/19 0145 08/07/19 0208  BP: (!) 111/55 (!) 101/49 (!) 109/51 (!) 96/52  Pulse: 77 81 82 79  Resp: 16 17 16 15   Temp: 98.4 F (36.9 C)     SpO2: 100% 100% 100% 98%   Medications - No data to display  3:21 AM Dr. Sherrie Sport at [facility] contacted me and requested that the patient be transferred to their facility as they are her primary hepatic care team.  The patient's hemoglobin is 7.7, down from 10.3 on 05/24/2019.  PROCEDURES  Procedures  CRITICAL CARE Performed by: Karen Chafe Bora Broner Total critical care time: 30 minutes Critical care time was exclusive of separately billable procedures and treating other patients. Critical care was necessary to treat or prevent imminent or life-threatening deterioration. Critical care was time spent personally by me on the following activities: development of treatment plan with patient and/or surrogate as well as nursing, discussions with consultants, evaluation of patient's response to  treatment, examination of patient, obtaining history from patient or surrogate, ordering and performing treatments and interventions, ordering and review of laboratory studies, ordering and review of radiographic studies, pulse oximetry and re-evaluation of patient's condition.   ED DIAGNOSES     ICD-10-CM   1. Cirrhosis of liver without ascites, unspecified hepatic cirrhosis type ([place])  K74.60   2. Hyponatremia  E87.1   3. Shortness of breath  R06.02   4. Symptomatic anemia  D64.9        Khamarion Bjelland, MD 08/07/19 0324    Shanon Rosser, MD 08/16/19 [phone]

## 2019-08-07 NOTE — ED Notes (Signed)
Bed assignment 8 Bed tower 8309-Bed 1

## 2019-08-07 NOTE — ED Notes (Signed)
[facility] center called to initiate transport for pt at this time.

## 2019-08-09 ENCOUNTER — Inpatient Hospital Stay (HOSPITAL_COMMUNITY): Admission: RE | Admit: 2019-08-09 | Payer: 59 | Source: Ambulatory Visit

## 2019-08-09 DIAGNOSIS — E871 Hypo-osmolality and hyponatremia: Secondary | ICD-10-CM | POA: Diagnosis not present

## 2019-08-09 DIAGNOSIS — I5030 Unspecified diastolic (congestive) heart failure: Secondary | ICD-10-CM

## 2019-08-09 DIAGNOSIS — R5383 Other fatigue: Secondary | ICD-10-CM | POA: Diagnosis not present

## 2019-08-09 DIAGNOSIS — E8779 Other fluid overload: Secondary | ICD-10-CM | POA: Diagnosis not present

## 2019-08-09 DIAGNOSIS — K7460 Unspecified cirrhosis of liver: Secondary | ICD-10-CM | POA: Diagnosis not present

## 2019-08-09 HISTORY — DX: Unspecified diastolic (congestive) heart failure: I50.30

## 2019-08-10 DIAGNOSIS — D649 Anemia, unspecified: Secondary | ICD-10-CM | POA: Diagnosis not present

## 2019-08-10 DIAGNOSIS — E8770 Fluid overload, unspecified: Secondary | ICD-10-CM | POA: Diagnosis not present

## 2019-08-10 DIAGNOSIS — K7460 Unspecified cirrhosis of liver: Secondary | ICD-10-CM | POA: Diagnosis not present

## 2019-08-10 DIAGNOSIS — R0602 Shortness of breath: Secondary | ICD-10-CM | POA: Diagnosis not present

## 2019-08-10 DIAGNOSIS — K743 Primary biliary cirrhosis: Secondary | ICD-10-CM | POA: Diagnosis not present

## 2019-08-10 DIAGNOSIS — K7290 Hepatic failure, unspecified without coma: Secondary | ICD-10-CM | POA: Diagnosis not present

## 2019-08-10 DIAGNOSIS — E871 Hypo-osmolality and hyponatremia: Secondary | ICD-10-CM | POA: Diagnosis not present

## 2019-08-10 DIAGNOSIS — E8779 Other fluid overload: Secondary | ICD-10-CM | POA: Diagnosis not present

## 2019-08-10 DIAGNOSIS — K8301 Primary sclerosing cholangitis: Secondary | ICD-10-CM | POA: Diagnosis not present

## 2019-08-10 DIAGNOSIS — I5030 Unspecified diastolic (congestive) heart failure: Secondary | ICD-10-CM | POA: Diagnosis not present

## 2019-08-11 DIAGNOSIS — E871 Hypo-osmolality and hyponatremia: Secondary | ICD-10-CM | POA: Diagnosis not present

## 2019-08-11 DIAGNOSIS — E8779 Other fluid overload: Secondary | ICD-10-CM | POA: Diagnosis not present

## 2019-08-11 DIAGNOSIS — I5030 Unspecified diastolic (congestive) heart failure: Secondary | ICD-10-CM | POA: Diagnosis not present

## 2019-08-11 DIAGNOSIS — D649 Anemia, unspecified: Secondary | ICD-10-CM | POA: Diagnosis not present

## 2019-08-11 DIAGNOSIS — K743 Primary biliary cirrhosis: Secondary | ICD-10-CM | POA: Diagnosis not present

## 2019-08-11 DIAGNOSIS — K7290 Hepatic failure, unspecified without coma: Secondary | ICD-10-CM | POA: Diagnosis not present

## 2019-08-11 DIAGNOSIS — G9349 Other encephalopathy: Secondary | ICD-10-CM | POA: Diagnosis not present

## 2019-08-11 DIAGNOSIS — R188 Other ascites: Secondary | ICD-10-CM | POA: Diagnosis not present

## 2019-08-11 DIAGNOSIS — E8770 Fluid overload, unspecified: Secondary | ICD-10-CM | POA: Diagnosis not present

## 2019-08-12 DIAGNOSIS — I272 Pulmonary hypertension, unspecified: Secondary | ICD-10-CM | POA: Diagnosis not present

## 2019-08-12 DIAGNOSIS — K7290 Hepatic failure, unspecified without coma: Secondary | ICD-10-CM | POA: Diagnosis not present

## 2019-08-12 DIAGNOSIS — D649 Anemia, unspecified: Secondary | ICD-10-CM | POA: Diagnosis not present

## 2019-08-12 DIAGNOSIS — I5030 Unspecified diastolic (congestive) heart failure: Secondary | ICD-10-CM | POA: Diagnosis not present

## 2019-08-12 DIAGNOSIS — G9349 Other encephalopathy: Secondary | ICD-10-CM | POA: Diagnosis not present

## 2019-08-12 DIAGNOSIS — E8779 Other fluid overload: Secondary | ICD-10-CM | POA: Diagnosis not present

## 2019-08-12 DIAGNOSIS — K743 Primary biliary cirrhosis: Secondary | ICD-10-CM | POA: Diagnosis not present

## 2019-08-12 DIAGNOSIS — E8770 Fluid overload, unspecified: Secondary | ICD-10-CM | POA: Diagnosis not present

## 2019-08-12 DIAGNOSIS — E871 Hypo-osmolality and hyponatremia: Secondary | ICD-10-CM | POA: Diagnosis not present

## 2019-08-13 DIAGNOSIS — I272 Pulmonary hypertension, unspecified: Secondary | ICD-10-CM | POA: Diagnosis not present

## 2019-08-13 DIAGNOSIS — K743 Primary biliary cirrhosis: Secondary | ICD-10-CM | POA: Diagnosis not present

## 2019-08-13 DIAGNOSIS — K7460 Unspecified cirrhosis of liver: Secondary | ICD-10-CM | POA: Diagnosis not present

## 2019-08-13 DIAGNOSIS — E871 Hypo-osmolality and hyponatremia: Secondary | ICD-10-CM | POA: Diagnosis not present

## 2019-08-13 DIAGNOSIS — E8770 Fluid overload, unspecified: Secondary | ICD-10-CM | POA: Diagnosis not present

## 2019-08-13 DIAGNOSIS — I5030 Unspecified diastolic (congestive) heart failure: Secondary | ICD-10-CM | POA: Diagnosis not present

## 2019-08-13 MED ORDER — HYDROXYZINE HCL 25 MG TABLET
Freq: Three times a day (TID) | ORAL | 0 refills | 0.00000 days | PRN
Start: 2019-08-13 — End: ?

## 2019-08-13 MED ORDER — SPIRONOLACTONE 100 MG TABLET
ORAL_TABLET | Freq: Every day | ORAL | 0 refills | 60.00000 days
Start: 2019-08-13 — End: 2019-09-12

## 2019-08-13 MED ORDER — RIFAXIMIN 550 MG TABLET
ORAL_TABLET | Freq: Two times a day (BID) | ORAL | 0 refills | 90.00000 days | Status: CP
Start: 2019-08-13 — End: 2019-11-11

## 2019-08-13 MED ORDER — FUROSEMIDE 40 MG TABLET
ORAL_TABLET | Freq: Every day | ORAL | 0 refills | 25.00000 days
Start: 2019-08-13 — End: 2019-09-12

## 2019-08-14 DIAGNOSIS — K743 Primary biliary cirrhosis: Secondary | ICD-10-CM | POA: Diagnosis not present

## 2019-08-14 DIAGNOSIS — I272 Pulmonary hypertension, unspecified: Secondary | ICD-10-CM | POA: Diagnosis not present

## 2019-08-14 DIAGNOSIS — K766 Portal hypertension: Secondary | ICD-10-CM | POA: Diagnosis not present

## 2019-08-14 DIAGNOSIS — I5030 Unspecified diastolic (congestive) heart failure: Secondary | ICD-10-CM | POA: Diagnosis not present

## 2019-08-14 DIAGNOSIS — Z951 Presence of aortocoronary bypass graft: Secondary | ICD-10-CM | POA: Diagnosis not present

## 2019-08-14 DIAGNOSIS — N390 Urinary tract infection, site not specified: Secondary | ICD-10-CM | POA: Diagnosis not present

## 2019-08-14 DIAGNOSIS — R4182 Altered mental status, unspecified: Secondary | ICD-10-CM | POA: Diagnosis not present

## 2019-08-14 DIAGNOSIS — I110 Hypertensive heart disease with heart failure: Secondary | ICD-10-CM | POA: Diagnosis not present

## 2019-08-14 DIAGNOSIS — K219 Gastro-esophageal reflux disease without esophagitis: Secondary | ICD-10-CM | POA: Diagnosis not present

## 2019-08-14 DIAGNOSIS — F061 Catatonic disorder due to known physiological condition: Secondary | ICD-10-CM | POA: Diagnosis not present

## 2019-08-14 DIAGNOSIS — K7290 Hepatic failure, unspecified without coma: Secondary | ICD-10-CM | POA: Diagnosis not present

## 2019-08-14 DIAGNOSIS — K8309 Other cholangitis: Secondary | ICD-10-CM | POA: Diagnosis not present

## 2019-08-14 DIAGNOSIS — R0989 Other specified symptoms and signs involving the circulatory and respiratory systems: Secondary | ICD-10-CM | POA: Diagnosis not present

## 2019-08-14 DIAGNOSIS — R161 Splenomegaly, not elsewhere classified: Secondary | ICD-10-CM | POA: Diagnosis not present

## 2019-08-14 DIAGNOSIS — I5033 Acute on chronic diastolic (congestive) heart failure: Secondary | ICD-10-CM | POA: Diagnosis not present

## 2019-08-14 DIAGNOSIS — K8301 Primary sclerosing cholangitis: Secondary | ICD-10-CM | POA: Diagnosis not present

## 2019-08-14 DIAGNOSIS — E871 Hypo-osmolality and hyponatremia: Secondary | ICD-10-CM | POA: Diagnosis not present

## 2019-08-14 DIAGNOSIS — Z20822 Contact with and (suspected) exposure to covid-19: Secondary | ICD-10-CM | POA: Diagnosis not present

## 2019-08-14 DIAGNOSIS — K7460 Unspecified cirrhosis of liver: Secondary | ICD-10-CM | POA: Diagnosis not present

## 2019-08-14 DIAGNOSIS — E8779 Other fluid overload: Secondary | ICD-10-CM | POA: Diagnosis not present

## 2019-08-14 HISTORY — DX: Altered mental status, unspecified: R41.82

## 2019-08-15 ENCOUNTER — Other Ambulatory Visit: Payer: Self-pay | Admitting: *Deleted

## 2019-08-15 ENCOUNTER — Ambulatory Visit
Admit: 2019-08-15 | Discharge: 2019-08-19 | Disposition: A | Discharge status: Home / Self Care | Payer: PRIVATE HEALTH INSURANCE | Admitting: Student in an Organized Health Care Education/Training Program

## 2019-08-15 NOTE — Patient Outreach (Signed)
[facility]) Care Management  08/15/2019  PAIRLEE SAWTELL 1970/12/21 [id number]   Transition of care   Referral received : 08/11/19 Initial outreach : 08/15/19 Insurance : UMR     Subjective: In preparing for initial post discharge call noted patient , Admitted to [facility] on 08/07/19  after transferred from [facility] on 6/13 Dx: Cirrhosis of Liver , Hyponatremia., end-stage liver disease ( primary biliary cirrhosis). Noted patient admitted at [facility] 6/13-6/19/21, Returned to ED at [facility] on 6/20 with Altered Mental status Hyponatremia. She is currently admitted.    Plan Will follow progression, for disposition plans for transition of care outreach.    [name], RN, BSN  [place] Management Coordinator  [phone]- Mobile [phone]- Toll Free Main Office

## 2019-08-16 ENCOUNTER — Inpatient Hospital Stay (HOSPITAL_COMMUNITY): Admission: RE | Admit: 2019-08-16 | Payer: 59 | Source: Ambulatory Visit

## 2019-08-18 DIAGNOSIS — F061 Catatonic disorder due to known physiological condition: Secondary | ICD-10-CM | POA: Insufficient documentation

## 2019-08-18 HISTORY — DX: Catatonic disorder due to known physiological condition: F06.1

## 2019-08-18 MED ORDER — LACTULOSE 20 GRAM ORAL PACKET
PACK | Freq: Two times a day (BID) | ORAL | 11 refills | 30 days | Status: CP
Start: 2019-08-18 — End: 2019-09-17

## 2019-08-18 MED FILL — LACTULOSE 10 GM/15 ML SOLN: 10 | 30 days supply | Qty: 900 | Fill #0

## 2019-08-19 MED ORDER — LORAZEPAM 0.5 MG TABLET: tablet | 0 refills | 0 days | Status: AC

## 2019-08-19 MED ORDER — LORAZEPAM 0.5 MG TABLET
ORAL_TABLET | ORAL | 0 refills | 0.00000 days | Status: CP
Start: 2019-08-19 — End: 2019-08-19

## 2019-08-19 MED ORDER — CIPROFLOXACIN 500 MG TABLET
ORAL_TABLET | Freq: Every day | ORAL | 3 refills | 90.00000 days
Start: 2019-08-19 — End: ?

## 2019-08-19 MED ORDER — FUROSEMIDE 40 MG TABLET
ORAL_TABLET | Freq: Every day | ORAL | 0 refills | 30.00000 days | Status: CP
Start: 2019-08-19 — End: 2019-09-18

## 2019-08-19 MED ORDER — MELATONIN 3 MG TABLET
ORAL_TABLET | Freq: Every evening | ORAL | 3 refills | 30.00000 days | Status: CP | PRN
Start: 2019-08-19 — End: ?

## 2019-08-19 MED ORDER — TRAZODONE 50 MG TABLET
ORAL_TABLET | Freq: Every evening | ORAL | 0 refills | 30.00000 days | Status: CP
Start: 2019-08-19 — End: 2019-09-18

## 2019-08-19 MED ORDER — LACTULOSE 20 GRAM ORAL PACKET: 20 g | packet | Freq: Three times a day (TID) | 3 refills | 30 days | Status: AC

## 2019-08-19 MED ORDER — RIFAXIMIN 550 MG TABLET
ORAL_TABLET | Freq: Two times a day (BID) | ORAL | 0 refills | 90.00000 days | Status: CP
Start: 2019-08-19 — End: 2019-11-17

## 2019-08-19 MED ORDER — PANTOPRAZOLE 40 MG TABLET,DELAYED RELEASE
ORAL_TABLET | Freq: Every day | ORAL | 2 refills | 14.00000 days | Status: CP
Start: 2019-08-19 — End: 2019-09-02

## 2019-08-19 MED ORDER — LACTULOSE 20 GRAM ORAL PACKET
PACK | Freq: Three times a day (TID) | ORAL | 3 refills | 30.00000 days | Status: CP
Start: 2019-08-19 — End: 2019-08-19

## 2019-08-19 MED FILL — PANTOPRAZOLE SOD DR 40 MG T: 40 | 14 days supply | Qty: 14 | Fill #0

## 2019-08-19 MED FILL — LORazepam 0.5 MG TABS: 0.5 | 30 days supply | Qty: 56 | Fill #0

## 2019-08-19 MED FILL — traZODone HCL 50 MG TABS: 50 | 30 days supply | Qty: 30 | Fill #0

## 2019-08-22 ENCOUNTER — Other Ambulatory Visit: Payer: Self-pay | Admitting: *Deleted

## 2019-08-22 ENCOUNTER — Encounter: Payer: Self-pay | Admitting: *Deleted

## 2019-08-22 DIAGNOSIS — M41123 Adolescent idiopathic scoliosis, cervicothoracic region: Secondary | ICD-10-CM | POA: Diagnosis not present

## 2019-08-22 DIAGNOSIS — M9903 Segmental and somatic dysfunction of lumbar region: Secondary | ICD-10-CM | POA: Diagnosis not present

## 2019-08-22 DIAGNOSIS — M9901 Segmental and somatic dysfunction of cervical region: Secondary | ICD-10-CM | POA: Diagnosis not present

## 2019-08-22 DIAGNOSIS — M9905 Segmental and somatic dysfunction of pelvic region: Secondary | ICD-10-CM | POA: Diagnosis not present

## 2019-08-22 MED FILL — FUROSEMIDE 40 MG TAB: 40 | 30 days supply | Qty: 90 | Fill #0

## 2019-08-22 NOTE — Patient Outreach (Addendum)
[facility]) Care Management  08/22/2019  Christine Butler 17-Feb-1971 [id number]   Transition of care call/case closure   Referral received:08/11/19 Initial outreach:08/22/19 Insurance: UMR    Subjective: Initial successful telephone call to patient's preferred number in order to complete transition of care assessment; 2 HIPAA identifiers verified. Explained purpose of call and completed transition of care assessment.  Christine Butler states that she is better, just working on getting her strength back.She discussed borrowing a walker from a neighbor. She reports appetite is getting better taking it slow sticking with fluid and sodium daily limits.   She discussed having a history of not sleeping well  at night for a while and thinks that had something to do with recent admission , she states even with ativan she is not sleeping well. She complains of having with itching associated with her condition and was taking atarax prior to admission and unsure if it is okay to use now that she is taking ativan. She states placing call to [facility] MD . She discussed researching local therapist that will accept UMR  and has arranged appointment for the week along with planned psychiatry follow up next month. Discussed [place] benefit of Employee assistance counseling program she states she plans to stick with her current plan.  Christine Butler states that she does monitor her weights daily.  Patient sister is staying with her to help with assisting with her care.   Christine Butler shared that she is currently on long term disability, has exhausted shorterm disability and states she has  been advised to social security. She is hopefull for having liver transplant and being to return to work in the future.  She  uses a [facility] outpatient pharmacy at [employer].     Objective:  Christine Butler was Admitted to [facility] on 08/07/19  after transferred from [facility] on 6/13 Dx: Cirrhosis of  Liver , Hyponatremia., end-stage liver disease ( primary biliary cirrhosis). Noted patient admitted at [facility] 6/13-6/19/21, Returned to ED at [facility] admitted 6/20-6/25/21 Dx  Altered Mental status, Hyponatremia, Agitated Catatonia . 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 She was discharged on home 08/19/19, without home health or DME . Patient reports having walker borrowed from neighbor as well a wheelchair.    Assessment:  Patient voices good understanding of all discharge instructions.  See transition of care flowsheet for assessment details.   Plan:  Reviewed hospital discharge diagnosis of Altered mental status ,  Agitated Catatonia, Volume Overload  and discharge treatment plan using hospital discharge instructions, assessing medication adherence, reviewing problems requiring provider notification, and discussing the importance of follow up with  primary care provider and/or specialists as directed. Placed call to [facility] for follow up on new prescription for Xifaxan , per representative medication needs prior authorization before filling, [place] has sent request to prescribing provider and awaiting status, will send another on today.  Return call to patient to explain, she discussed plan to notify transplant team that prescribed medication.   Will plan return call in the next 4 business days  for follow up on receiving prior authorization for new medication. . Will route successful outreach letter with [place] Management pamphlet and 24 Hour Nurse Line Magnet to [facility] clinical pool to be mailed to patient's home  address.   [name], RN, BSN  [place] Management Coordinator  [phone]- Mobile [phone]- Toll Free Main  Office

## 2019-08-23 ENCOUNTER — Other Ambulatory Visit: Payer: Self-pay

## 2019-08-23 ENCOUNTER — Encounter (HOSPITAL_COMMUNITY)
Admission: RE | Admit: 2019-08-23 | Discharge: 2019-08-23 | Disposition: A | Discharge status: Home / Self Care | Payer: 59 | Source: Ambulatory Visit | Attending: Transplant Hepatology | Admitting: Transplant Hepatology

## 2019-08-23 DIAGNOSIS — K7469 Other cirrhosis of liver: Secondary | ICD-10-CM | POA: Diagnosis not present

## 2019-08-23 LAB — COMPREHENSIVE METABOLIC PANEL
ALT: 32 U/L (ref 0–44)
AST: 51 U/L — ABNORMAL HIGH (ref 15–41)
Albumin: 3.1 g/dL — ABNORMAL LOW (ref 3.5–5.0)
Alkaline Phosphatase: 134 U/L — ABNORMAL HIGH (ref 38–126)
Anion gap: 10 (ref 5–15)
BUN: 15 mg/dL (ref 6–20)
CO2: 23 mmol/L (ref 22–32)
Calcium: 8.8 mg/dL — ABNORMAL LOW (ref 8.9–10.3)
Chloride: 87 mmol/L — ABNORMAL LOW (ref 98–111)
Creatinine, Ser: 1.03 mg/dL — ABNORMAL HIGH (ref 0.44–1.00)
GFR calc Af Amer: >60 mL/min (ref >60)
GFR calc non Af Amer: >60 mL/min (ref >60)
Glucose, Bld: 131 mg/dL — ABNORMAL HIGH (ref 70–99)
Potassium: 4.7 mmol/L (ref 3.5–5.1)
Sodium: 120 mmol/L — ABNORMAL LOW (ref 135–145)
Total Bilirubin: 8.3 mg/dL — ABNORMAL HIGH (ref 0.3–1.2)
Total Protein: 6.5 g/dL (ref 6.5–8.1)

## 2019-08-23 LAB — PROTIME-INR
INR: 1.5 — ABNORMAL HIGH (ref 0.8–1.2)
Prothrombin Time: 17.3 seconds — ABNORMAL HIGH (ref 11.4–15.2)

## 2019-08-23 MED ORDER — ALBUMIN HUMAN 25 % IV SOLN
50.0000 g | INTRAVENOUS | Status: DC
  Administered 2019-08-23: 50 g via INTRAVENOUS
  Filled 2019-08-23: qty 200

## 2019-08-23 MED ORDER — FUROSEMIDE 10 MG/ML IJ SOLN
INTRAMUSCULAR | Status: AC
  Administered 2019-08-23: 40 mg via INTRAVENOUS
  Filled 2019-08-23: qty 4

## 2019-08-23 MED ORDER — HEPARIN SOD (PORK) LOCK FLUSH 100 UNIT/ML IV SOLN
INTRAVENOUS | Status: AC
  Administered 2019-08-23: 500 [IU]
  Filled 2019-08-23: qty 5

## 2019-08-23 MED ORDER — HEPARIN SOD (PORK) LOCK FLUSH 100 UNIT/ML IV SOLN: 500.0000 [IU] | INTRAVENOUS | Status: DC

## 2019-08-23 MED ORDER — HEPARIN SOD (PORK) LOCK FLUSH 100 UNIT/ML IV SOLN: 500.0000 [IU] | INTRAVENOUS | Status: DC | PRN

## 2019-08-23 MED ORDER — FUROSEMIDE 10 MG/ML IJ SOLN: 40.0000 mg | INTRAMUSCULAR | Status: DC

## 2019-08-25 ENCOUNTER — Other Ambulatory Visit: Payer: Self-pay | Admitting: *Deleted

## 2019-08-25 DIAGNOSIS — M41123 Adolescent idiopathic scoliosis, cervicothoracic region: Secondary | ICD-10-CM | POA: Diagnosis not present

## 2019-08-25 DIAGNOSIS — M9905 Segmental and somatic dysfunction of pelvic region: Secondary | ICD-10-CM | POA: Diagnosis not present

## 2019-08-25 DIAGNOSIS — M9903 Segmental and somatic dysfunction of lumbar region: Secondary | ICD-10-CM | POA: Diagnosis not present

## 2019-08-25 DIAGNOSIS — M9901 Segmental and somatic dysfunction of cervical region: Secondary | ICD-10-CM | POA: Diagnosis not present

## 2019-08-25 NOTE — Patient Outreach (Signed)
[facility]) Care Management  08/25/2019  Christine Butler 1970/06/22 [id number]   Transition of care follow up call  Referral received: 08/11/19 Initial outreach attempt: 08/22/19 Insurance: UMR    Subjective: Unsuccessful outreach call to patient to follow up on receiving new prescription Xifaxan prescribed at recent hospital admission that was awaiting prior authorization.  1344 Unsuccessful return call to patient .   Objective: Christine Butler was Admitted to [facility] on 08/07/19 after transferred from [facility] on 6/13 Dx: Cirrhosis of Liver , Hyponatremia., end-stage liver disease ( primary biliary cirrhosis). Noted patient admitted at [facility] 6/13-6/19/21, Returned to ED at [facility] admitted 6/20-6/25/21 Dx  Altered Mental status, Hyponatremia, Agitated Catatonia . 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 She was discharged on home 08/19/19, without home health or DME . Patient reports having walker borrowed from neighbor as well a wheelchair.    Plan If no return call from patient will attempt return call in the next 4 business days.  Placed call to patient pharmacy outpatient at [facility] states that they are awaiting prior authorization has sent and will send again to [place] MD that prescribed at [place].   [name], RN, BSN  [place] Management Coordinator  [phone]- Mobile [phone]- Toll Free Main Office

## 2019-08-29 MED ORDER — DOXEPIN 10 MG CAPSULE
ORAL_CAPSULE | Freq: Every evening | ORAL | 0 refills | 30.00000 days | Status: CP
Start: 2019-08-29 — End: 2019-09-28

## 2019-08-29 MED FILL — DOXEPIN HCL 10 MG CAPS: 10 | 30 days supply | Qty: 30 | Fill #0

## 2019-08-30 ENCOUNTER — Other Ambulatory Visit: Payer: Self-pay | Admitting: *Deleted

## 2019-08-30 ENCOUNTER — Encounter (HOSPITAL_COMMUNITY)
Admission: RE | Admit: 2019-08-30 | Discharge: 2019-08-30 | Disposition: A | Discharge status: Home / Self Care | Payer: 59 | Source: Ambulatory Visit | Attending: Transplant Hepatology | Admitting: Transplant Hepatology

## 2019-08-30 ENCOUNTER — Other Ambulatory Visit: Payer: Self-pay

## 2019-08-30 DIAGNOSIS — K7469 Other cirrhosis of liver: Secondary | ICD-10-CM | POA: Diagnosis not present

## 2019-08-30 LAB — COMPREHENSIVE METABOLIC PANEL
ALT: 55 U/L — ABNORMAL HIGH (ref 0–44)
AST: 93 U/L — ABNORMAL HIGH (ref 15–41)
Albumin: 3.1 g/dL — ABNORMAL LOW (ref 3.5–5.0)
Alkaline Phosphatase: 115 U/L (ref 38–126)
Anion gap: 11 (ref 5–15)
BUN: 25 mg/dL — ABNORMAL HIGH (ref 6–20)
CO2: 19 mmol/L — ABNORMAL LOW (ref 22–32)
Calcium: 8.6 mg/dL — ABNORMAL LOW (ref 8.9–10.3)
Chloride: 89 mmol/L — ABNORMAL LOW (ref 98–111)
Creatinine, Ser: 1.03 mg/dL — ABNORMAL HIGH (ref 0.44–1.00)
GFR calc Af Amer: >60 mL/min (ref >60)
GFR calc non Af Amer: >60 mL/min (ref >60)
Glucose, Bld: 85 mg/dL (ref 70–99)
Potassium: 4.7 mmol/L (ref 3.5–5.1)
Sodium: 119 mmol/L — CL (ref 135–145)
Total Bilirubin: 7.4 mg/dL — ABNORMAL HIGH (ref 0.3–1.2)
Total Protein: 6.2 g/dL — ABNORMAL LOW (ref 6.5–8.1)

## 2019-08-30 LAB — PROTIME-INR
INR: 1.4 — ABNORMAL HIGH (ref 0.8–1.2)
Prothrombin Time: 16.9 seconds — ABNORMAL HIGH (ref 11.4–15.2)

## 2019-08-30 MED ORDER — HEPARIN SOD (PORK) LOCK FLUSH 100 UNIT/ML IV SOLN: 500.0000 [IU] | INTRAVENOUS | Status: DC

## 2019-08-30 MED ORDER — ALBUMIN HUMAN 25 % IV SOLN
50.0000 g | INTRAVENOUS | Status: DC
  Administered 2019-08-30: 50 g via INTRAVENOUS
  Filled 2019-08-30: qty 200

## 2019-08-30 MED ORDER — HEPARIN SOD (PORK) LOCK FLUSH 100 UNIT/ML IV SOLN: 500.0000 [IU] | INTRAVENOUS | Status: DC | PRN

## 2019-08-30 MED ORDER — FUROSEMIDE 10 MG/ML IJ SOLN
INTRAMUSCULAR | Status: AC
  Administered 2019-08-30: 40 mg via INTRAVENOUS
  Filled 2019-08-30: qty 4

## 2019-08-30 MED ORDER — FUROSEMIDE 10 MG/ML IJ SOLN: 40.0000 mg | INTRAMUSCULAR | Status: DC

## 2019-08-30 MED ORDER — HEPARIN SOD (PORK) LOCK FLUSH 100 UNIT/ML IV SOLN
INTRAVENOUS | Status: AC
  Administered 2019-08-30: 500 [IU]
  Filled 2019-08-30: qty 5

## 2019-08-30 NOTE — Patient Outreach (Signed)
[facility]) Care Management  08/30/2019  Christine Butler May 09, 1970 [id number]   Transition of care follow up assessment     Referral received: 08/11/19 Initial outreach attempt: 08/22/19 Insurance: UMR    Subjective: Outreach call to patient , she reports feeling a little worn out on today and trying to decorate a cake. She discussed that she has not heard anything further on Xifaxan prescribed at discharge, she is still awaiting medication to be approved.  Patient provided me contact information on her [facility] provider.She states she does not feel like talking further now when began to discuss follow up with therapist.  Objective: [facility] after [facility] from [facility] on 6/13 Dx: Cirrhosis of Liver , Hyponatremia., end-stage liver disease ( primary biliary cirrhosis). Noted patient admitted at [facility] 6/13-6/19/21, [facility] DxAltered Mental status,Hyponatremia, Agitated Catatonia . 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 She was discharged on home 08/19/19, without home health or DME . Patient reports having walker borrowed from neighbor as well a wheelchair.   Plan Placed call to [place] office patient transplant MD, able to leave a message on Christine Butler pre transplant coordinator  line regarding patient unfilled prescription at discharge for [place] awaiting prior authorization.  Will plan return call to patient within a week or sooner with update .   Christine Draft, RN, BSN  [place] Management Coordinator  [phone]- Mobile [phone]- Toll Free Main Office

## 2019-08-31 DIAGNOSIS — M9903 Segmental and somatic dysfunction of lumbar region: Secondary | ICD-10-CM | POA: Diagnosis not present

## 2019-08-31 DIAGNOSIS — M41123 Adolescent idiopathic scoliosis, cervicothoracic region: Secondary | ICD-10-CM | POA: Diagnosis not present

## 2019-08-31 DIAGNOSIS — M9905 Segmental and somatic dysfunction of pelvic region: Secondary | ICD-10-CM | POA: Diagnosis not present

## 2019-08-31 DIAGNOSIS — M9901 Segmental and somatic dysfunction of cervical region: Secondary | ICD-10-CM | POA: Diagnosis not present

## 2019-09-02 DIAGNOSIS — M9901 Segmental and somatic dysfunction of cervical region: Secondary | ICD-10-CM | POA: Diagnosis not present

## 2019-09-02 DIAGNOSIS — M9903 Segmental and somatic dysfunction of lumbar region: Secondary | ICD-10-CM | POA: Diagnosis not present

## 2019-09-02 DIAGNOSIS — M41123 Adolescent idiopathic scoliosis, cervicothoracic region: Secondary | ICD-10-CM | POA: Diagnosis not present

## 2019-09-02 DIAGNOSIS — M9905 Segmental and somatic dysfunction of pelvic region: Secondary | ICD-10-CM | POA: Diagnosis not present

## 2019-09-05 ENCOUNTER — Other Ambulatory Visit: Payer: Self-pay | Admitting: *Deleted

## 2019-09-05 ENCOUNTER — Telehealth: Admit: 2019-09-05 | Discharge: 2019-09-06 | Payer: PRIVATE HEALTH INSURANCE

## 2019-09-05 DIAGNOSIS — F061 Catatonic disorder due to known physiological condition: Principal | ICD-10-CM

## 2019-09-05 DIAGNOSIS — G4709 Other insomnia: Principal | ICD-10-CM

## 2019-09-05 HISTORY — DX: Other insomnia: G47.09

## 2019-09-05 MED ORDER — MELATONIN 3 MG TABLET
ORAL_TABLET | Freq: Every evening | ORAL | 3 refills | 30.00000 days | Status: CP
Start: 2019-09-05 — End: ?

## 2019-09-05 NOTE — Patient Outreach (Signed)
[facility]) Care Management  09/05/2019  Christine Butler Nov 13, 1970 [id number]   Transition of care follow up assessment     Referral received:08/11/19 Initial outreach attempt:08/22/19 Insurance:[employer]   Subjective: Outreach call to patient , she discussed feeling better on today, had visit with psychiatry on today and counselor appointment on tomorrow. Discussed reason for follow up to determine if she had received prescription for Xifaxin prescribed at recent discharge, patient states that she has not. Discussed being able to leave a message with Liver Transplant coordinator [name] . Patient states she has not received a call from pharmacy, yet states she will send message to Liver Transplant coordinator again. Patient agreeable that I could place call to [facility] to follow up if they had received prior authorization yet. Spoke with pharmacy staff she states that they have not received authorization from insurance yet , she discussed cost of medication.  Patient request that I send a return email message to her today regarding follow up call to pharmacy.   Objective: [facility] after transferred from [facility] on 6/13 Dx: Cirrhosis of Liver , Hyponatremia., end-stage liver disease ( primary biliary cirrhosis). Noted patient admitted at [facility] 6/13-6/19/21, [facility] DxAltered Mental status,Hyponatremia, Agitated Catatonia . 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 She was discharged on home 08/19/19, without home health or DME . Patient reports having walker borrowed from neighbor as well a wheelchair.   Plan Will plan return follow up call to patient within this week.  Will send secure email to  patient  address for follow up on pharmacy as she requested.    [name], RN, BSN  [place] Management Coordinator  [phone]- Mobile [phone]- Toll Free Main Office

## 2019-09-06 ENCOUNTER — Other Ambulatory Visit: Payer: Self-pay

## 2019-09-06 ENCOUNTER — Ambulatory Visit (HOSPITAL_COMMUNITY)
Admission: RE | Admit: 2019-09-06 | Discharge: 2019-09-06 | Disposition: A | Discharge status: Home / Self Care | Payer: 59 | Source: Ambulatory Visit | Attending: Transplant Hepatology | Admitting: Transplant Hepatology

## 2019-09-06 DIAGNOSIS — F411 Generalized anxiety disorder: Secondary | ICD-10-CM | POA: Diagnosis not present

## 2019-09-06 DIAGNOSIS — K7469 Other cirrhosis of liver: Secondary | ICD-10-CM | POA: Insufficient documentation

## 2019-09-06 LAB — COMPREHENSIVE METABOLIC PANEL
ALT: 60 U/L — ABNORMAL HIGH (ref 0–44)
AST: 93 U/L — ABNORMAL HIGH (ref 15–41)
Albumin: 3.3 g/dL — ABNORMAL LOW (ref 3.5–5.0)
Alkaline Phosphatase: 123 U/L (ref 38–126)
Anion gap: 9 (ref 5–15)
BUN: 18 mg/dL (ref 6–20)
CO2: 19 mmol/L — ABNORMAL LOW (ref 22–32)
Calcium: 8.9 mg/dL (ref 8.9–10.3)
Chloride: 96 mmol/L — ABNORMAL LOW (ref 98–111)
Creatinine, Ser: 0.77 mg/dL (ref 0.44–1.00)
GFR calc Af Amer: >60 mL/min (ref >60)
GFR calc non Af Amer: >60 mL/min (ref >60)
Glucose, Bld: 114 mg/dL — ABNORMAL HIGH (ref 70–99)
Potassium: 5 mmol/L (ref 3.5–5.1)
Sodium: 124 mmol/L — ABNORMAL LOW (ref 135–145)
Total Bilirubin: 7.6 mg/dL — ABNORMAL HIGH (ref 0.3–1.2)
Total Protein: 6.5 g/dL (ref 6.5–8.1)

## 2019-09-06 LAB — PROTIME-INR
INR: 1.3 — ABNORMAL HIGH (ref 0.8–1.2)
Prothrombin Time: 16 seconds — ABNORMAL HIGH (ref 11.4–15.2)

## 2019-09-06 MED ORDER — HEPARIN SOD (PORK) LOCK FLUSH 100 UNIT/ML IV SOLN: 500.0000 [IU] | INTRAVENOUS | Status: DC | PRN

## 2019-09-06 MED ORDER — HEPARIN SOD (PORK) LOCK FLUSH 100 UNIT/ML IV SOLN: 500.0000 [IU] | INTRAVENOUS | Status: DC

## 2019-09-06 MED ORDER — FUROSEMIDE 10 MG/ML IJ SOLN: 40.0000 mg | INTRAMUSCULAR | Status: DC

## 2019-09-06 MED ORDER — FUROSEMIDE 10 MG/ML IJ SOLN
INTRAMUSCULAR | Status: AC
  Administered 2019-09-06: 40 mg via INTRAVENOUS
  Filled 2019-09-06: qty 4

## 2019-09-06 MED ORDER — ALBUMIN HUMAN 25 % IV SOLN
50.0000 g | INTRAVENOUS | Status: DC
  Administered 2019-09-06: 50 g via INTRAVENOUS
  Filled 2019-09-06: qty 200

## 2019-09-06 MED ORDER — HEPARIN SOD (PORK) LOCK FLUSH 100 UNIT/ML IV SOLN
INTRAVENOUS | Status: AC
  Administered 2019-09-06: 500 [IU]
  Filled 2019-09-06: qty 5

## 2019-09-07 MED FILL — SPIRONOLACTONE 100 MG TAB: 100 | 90 days supply | Qty: 90 | Fill #3

## 2019-09-07 MED FILL — PANTOPRAZOLE SOD DR 40 MG T: 40 | 14 days supply | Qty: 14 | Fill #1

## 2019-09-13 ENCOUNTER — Other Ambulatory Visit: Payer: Self-pay

## 2019-09-13 ENCOUNTER — Ambulatory Visit (HOSPITAL_COMMUNITY)
Admission: RE | Admit: 2019-09-13 | Discharge: 2019-09-13 | Disposition: A | Discharge status: Home / Self Care | Payer: 59 | Source: Ambulatory Visit | Attending: Transplant Hepatology | Admitting: Transplant Hepatology

## 2019-09-13 DIAGNOSIS — K7469 Other cirrhosis of liver: Secondary | ICD-10-CM | POA: Insufficient documentation

## 2019-09-13 DIAGNOSIS — M9901 Segmental and somatic dysfunction of cervical region: Secondary | ICD-10-CM | POA: Diagnosis not present

## 2019-09-13 DIAGNOSIS — M9903 Segmental and somatic dysfunction of lumbar region: Secondary | ICD-10-CM | POA: Diagnosis not present

## 2019-09-13 DIAGNOSIS — M41123 Adolescent idiopathic scoliosis, cervicothoracic region: Secondary | ICD-10-CM | POA: Diagnosis not present

## 2019-09-13 DIAGNOSIS — M9905 Segmental and somatic dysfunction of pelvic region: Secondary | ICD-10-CM | POA: Diagnosis not present

## 2019-09-13 LAB — COMPREHENSIVE METABOLIC PANEL
ALT: 41 U/L (ref 0–44)
AST: 53 U/L — ABNORMAL HIGH (ref 15–41)
Albumin: 3.6 g/dL (ref 3.5–5.0)
Alkaline Phosphatase: 120 U/L (ref 38–126)
Anion gap: 13 (ref 5–15)
BUN: 17 mg/dL (ref 6–20)
CO2: 21 mmol/L — ABNORMAL LOW (ref 22–32)
Calcium: 9 mg/dL (ref 8.9–10.3)
Chloride: 88 mmol/L — ABNORMAL LOW (ref 98–111)
Creatinine, Ser: 0.84 mg/dL (ref 0.44–1.00)
GFR calc Af Amer: >60 mL/min (ref >60)
GFR calc non Af Amer: >60 mL/min (ref >60)
Glucose, Bld: 139 mg/dL — ABNORMAL HIGH (ref 70–99)
Potassium: 3.8 mmol/L (ref 3.5–5.1)
Sodium: 122 mmol/L — ABNORMAL LOW (ref 135–145)
Total Bilirubin: 7 mg/dL — ABNORMAL HIGH (ref 0.3–1.2)
Total Protein: 6.8 g/dL (ref 6.5–8.1)

## 2019-09-13 LAB — PROTIME-INR
INR: 1.4 — ABNORMAL HIGH (ref 0.8–1.2)
Prothrombin Time: 16.6 seconds — ABNORMAL HIGH (ref 11.4–15.2)

## 2019-09-13 MED ORDER — ALBUMIN HUMAN 25 % IV SOLN
50.0000 g | INTRAVENOUS | Status: DC
  Administered 2019-09-13: 50 g via INTRAVENOUS
  Filled 2019-09-13: qty 200

## 2019-09-13 MED ORDER — HEPARIN SOD (PORK) LOCK FLUSH 100 UNIT/ML IV SOLN: 500.0000 [IU] | INTRAVENOUS | Status: DC | PRN

## 2019-09-13 MED ORDER — HEPARIN SOD (PORK) LOCK FLUSH 100 UNIT/ML IV SOLN: 500.0000 [IU] | INTRAVENOUS | Status: DC

## 2019-09-13 MED ORDER — FUROSEMIDE 10 MG/ML IJ SOLN
INTRAMUSCULAR | Status: AC
  Administered 2019-09-13: 40 mg via INTRAVENOUS
  Filled 2019-09-13: qty 4

## 2019-09-13 MED ORDER — FUROSEMIDE 10 MG/ML IJ SOLN: 40.0000 mg | INTRAMUSCULAR | Status: DC

## 2019-09-13 MED ORDER — HEPARIN SOD (PORK) LOCK FLUSH 100 UNIT/ML IV SOLN
INTRAVENOUS | Status: AC
  Administered 2019-09-13: 500 [IU]
  Filled 2019-09-13: qty 5

## 2019-09-14 ENCOUNTER — Other Ambulatory Visit: Payer: Self-pay | Admitting: *Deleted

## 2019-09-14 DIAGNOSIS — F411 Generalized anxiety disorder: Secondary | ICD-10-CM | POA: Diagnosis not present

## 2019-09-14 NOTE — Patient Outreach (Signed)
[facility]) Care Management  09/14/2019  Christine Butler 09/20/1970 [id number]   Transition of care /Case Closure    Referral received:08/11/19 Initial outreach:08/22/19 Insurance: [employer]    Subjective: Successful outreach call to patient , she reports having a good week. She has scheduled counselor appointment on today. She discussed liver transplant office working on prior authorization for medication Xifaxan, and she has follow up appointment on 7/26 to discuss plan.She denies any new concerns at this time.   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 Noted patient admitted at [facility] 6/13-6/19/21, [facility] DxAltered Mental status,Hyponatremia, Agitated Catatonia . 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 She was discharged on home 08/19/19, without home health or DME .  Plan Will close case to [facility] care management no new care coordination needs identified.    [name], RN, BSN  [place] Management Coordinator  [phone]- Mobile [phone]- Toll Free Main Office

## 2019-09-16 MED FILL — CIPROFLOXACIN HCL 500 MG TA: 500 | 30 days supply | Qty: 30 | Fill #1

## 2019-09-17 ENCOUNTER — Ambulatory Visit
Admit: 2019-09-17 | Discharge: 2019-09-17 | Disposition: A | Discharge status: Home / Self Care | Payer: PRIVATE HEALTH INSURANCE | Attending: Emergency Medicine

## 2019-09-17 ENCOUNTER — Emergency Department
Admit: 2019-09-17 | Discharge: 2019-09-17 | Disposition: A | Discharge status: Home / Self Care | Payer: PRIVATE HEALTH INSURANCE | Attending: Emergency Medicine

## 2019-09-17 DIAGNOSIS — R4182 Altered mental status, unspecified: Principal | ICD-10-CM

## 2019-09-17 DIAGNOSIS — K7460 Unspecified cirrhosis of liver: Secondary | ICD-10-CM | POA: Diagnosis not present

## 2019-09-17 DIAGNOSIS — R9431 Abnormal electrocardiogram [ECG] [EKG]: Secondary | ICD-10-CM | POA: Diagnosis not present

## 2019-09-17 DIAGNOSIS — I2510 Atherosclerotic heart disease of native coronary artery without angina pectoris: Secondary | ICD-10-CM | POA: Diagnosis not present

## 2019-09-17 DIAGNOSIS — Z20822 Contact with and (suspected) exposure to covid-19: Secondary | ICD-10-CM | POA: Diagnosis not present

## 2019-09-17 DIAGNOSIS — Z885 Allergy status to narcotic agent status: Secondary | ICD-10-CM | POA: Diagnosis not present

## 2019-09-17 DIAGNOSIS — E7849 Other hyperlipidemia: Secondary | ICD-10-CM | POA: Diagnosis not present

## 2019-09-17 DIAGNOSIS — R638 Other symptoms and signs concerning food and fluid intake: Secondary | ICD-10-CM | POA: Diagnosis not present

## 2019-09-17 DIAGNOSIS — R1110 Vomiting, unspecified: Secondary | ICD-10-CM | POA: Diagnosis not present

## 2019-09-17 DIAGNOSIS — Z951 Presence of aortocoronary bypass graft: Secondary | ICD-10-CM | POA: Diagnosis not present

## 2019-09-19 ENCOUNTER — Emergency Department (HOSPITAL_COMMUNITY)
Admission: EM | Admit: 2019-09-19 | Discharge: 2019-09-19 | Disposition: A | Discharge status: Home / Self Care | Payer: 59 | Attending: Emergency Medicine | Admitting: Emergency Medicine

## 2019-09-19 ENCOUNTER — Encounter (HOSPITAL_COMMUNITY): Payer: Self-pay | Admitting: *Deleted

## 2019-09-19 ENCOUNTER — Emergency Department (HOSPITAL_COMMUNITY): Payer: 59

## 2019-09-19 ENCOUNTER — Other Ambulatory Visit: Payer: Self-pay

## 2019-09-19 ENCOUNTER — Ambulatory Visit: Admit: 2019-09-19 | Payer: PRIVATE HEALTH INSURANCE

## 2019-09-19 DIAGNOSIS — Z7982 Long term (current) use of aspirin: Secondary | ICD-10-CM | POA: Insufficient documentation

## 2019-09-19 DIAGNOSIS — R0789 Other chest pain: Secondary | ICD-10-CM | POA: Diagnosis not present

## 2019-09-19 DIAGNOSIS — I959 Hypotension, unspecified: Secondary | ICD-10-CM | POA: Diagnosis not present

## 2019-09-19 DIAGNOSIS — I2510 Atherosclerotic heart disease of native coronary artery without angina pectoris: Secondary | ICD-10-CM | POA: Diagnosis not present

## 2019-09-19 DIAGNOSIS — R0602 Shortness of breath: Secondary | ICD-10-CM | POA: Diagnosis not present

## 2019-09-19 DIAGNOSIS — Z951 Presence of aortocoronary bypass graft: Secondary | ICD-10-CM | POA: Insufficient documentation

## 2019-09-19 DIAGNOSIS — R17 Unspecified jaundice: Secondary | ICD-10-CM | POA: Diagnosis not present

## 2019-09-19 DIAGNOSIS — R457 State of emotional shock and stress, unspecified: Secondary | ICD-10-CM | POA: Diagnosis not present

## 2019-09-19 DIAGNOSIS — Z79899 Other long term (current) drug therapy: Secondary | ICD-10-CM | POA: Insufficient documentation

## 2019-09-19 DIAGNOSIS — R079 Chest pain, unspecified: Secondary | ICD-10-CM

## 2019-09-19 LAB — COMPREHENSIVE METABOLIC PANEL
ALT: 37 U/L (ref 0–44)
AST: 60 U/L — ABNORMAL HIGH (ref 15–41)
Albumin: 3.5 g/dL (ref 3.5–5.0)
Alkaline Phosphatase: 108 U/L (ref 38–126)
Anion gap: 11 (ref 5–15)
BUN: 17 mg/dL (ref 6–20)
CO2: 20 mmol/L — ABNORMAL LOW (ref 22–32)
Calcium: 8.8 mg/dL — ABNORMAL LOW (ref 8.9–10.3)
Chloride: 90 mmol/L — ABNORMAL LOW (ref 98–111)
Creatinine, Ser: 0.96 mg/dL (ref 0.44–1.00)
GFR calc Af Amer: >60 mL/min (ref >60)
GFR calc non Af Amer: >60 mL/min (ref >60)
Glucose, Bld: 112 mg/dL — ABNORMAL HIGH (ref 70–99)
Potassium: 4.6 mmol/L (ref 3.5–5.1)
Sodium: 121 mmol/L — ABNORMAL LOW (ref 135–145)
Total Bilirubin: 6.7 mg/dL — ABNORMAL HIGH (ref 0.3–1.2)
Total Protein: 6.4 g/dL — ABNORMAL LOW (ref 6.5–8.1)

## 2019-09-19 LAB — CBC WITH DIFFERENTIAL/PLATELET
Abs Immature Granulocytes: 0.05 10*3/uL (ref 0.00–0.07)
Basophils Absolute: 0 10*3/uL (ref 0.0–0.1)
Basophils Relative: 1 %
Eosinophils Absolute: 0.1 10*3/uL (ref 0.0–0.5)
Eosinophils Relative: 2 %
HCT: 24 % — ABNORMAL LOW (ref 36.0–46.0)
Hemoglobin: 8.4 g/dL — ABNORMAL LOW (ref 12.0–15.0)
Immature Granulocytes: 1 %
Lymphocytes Relative: 11 %
Lymphs Abs: 0.7 10*3/uL (ref 0.7–4.0)
MCH: 32.9 pg (ref 26.0–34.0)
MCHC: 35 g/dL (ref 30.0–36.0)
MCV: 94.1 fL (ref 80.0–100.0)
Monocytes Absolute: 1 10*3/uL (ref 0.1–1.0)
Monocytes Relative: 16 %
Neutro Abs: 4.6 10*3/uL (ref 1.7–7.7)
Neutrophils Relative %: 69 %
Platelets: 114 10*3/uL — ABNORMAL LOW (ref 150–400)
RBC: 2.55 MIL/uL — ABNORMAL LOW (ref 3.87–5.11)
RDW: 16.2 % — ABNORMAL HIGH (ref 11.5–15.5)
WBC: 6.4 10*3/uL (ref 4.0–10.5)
nRBC: 0 % (ref 0.0–0.2)

## 2019-09-19 LAB — TROPONIN I (HIGH SENSITIVITY)
Troponin I (High Sensitivity): 5 ng/L (ref <18)
Troponin I (High Sensitivity): 4 ng/L (ref <18)

## 2019-09-19 LAB — PROTIME-INR
INR: 1.5 — ABNORMAL HIGH (ref 0.8–1.2)
Prothrombin Time: 17.3 seconds — ABNORMAL HIGH (ref 11.4–15.2)

## 2019-09-19 LAB — BRAIN NATRIURETIC PEPTIDE: B Natriuretic Peptide: 122.5 pg/mL — ABNORMAL HIGH (ref 0.0–100.0)

## 2019-09-19 MED ORDER — HEPARIN SOD (PORK) LOCK FLUSH 100 UNIT/ML IV SOLN
500.0000 [IU] | Freq: Once | INTRAVENOUS | Status: AC
  Administered 2019-09-19: 500 [IU]
  Filled 2019-09-19: qty 5

## 2019-09-19 MED ORDER — SODIUM CHLORIDE 0.9% FLUSH: 10.0000 mL | Freq: Two times a day (BID) | INTRAVENOUS | Status: DC

## 2019-09-19 MED ORDER — CHLORHEXIDINE GLUCONATE CLOTH 2 % EX PADS: 6.0000 | MEDICATED_PAD | Freq: Every day | CUTANEOUS | Status: DC

## 2019-09-19 MED ORDER — SODIUM CHLORIDE 0.9% FLUSH: 10.0000 mL | INTRAVENOUS | Status: DC | PRN

## 2019-09-19 NOTE — ED Notes (Signed)
Signature pad not working in room pt verbalized understanding of paperwork

## 2019-09-19 NOTE — ED Triage Notes (Signed)
The pt arrived by [facility] from  Home chest pain and sob that awakened her from sleep 02 sat 100% on room air  She was given 324mg  of babt aspirin by ems  Pain 4/10   She is currently waiting for a liver tgransplant she has a power port in her  Rt chest she gets albumin  Infusions  frequently

## 2019-09-19 NOTE — ED Provider Notes (Signed)
Care handoff received from Quincy Carnes, PA-C at shift change please see her note for full details of visit.  In short 49 year old female with extensive medical history including CAD, CABG, cirrhosis awaiting transplant at [facility] presented for chest pain.  Work-up so far has been reassuring.  Plan of care at shift change is for a delta troponin if negative discharge with follow-up with her cardiologist. Physical Exam  BP (!) 98/46   Pulse 82   Temp (!) 97.5 F (36.4 C) (Oral)   Resp 18   Ht 5\' 2"  (1.575 m)   Wt 61.2 kg   SpO2 100%   BMI 24.68 kg/m   Physical Exam Constitutional:      General: She is not in acute distress.    Appearance: Normal appearance. She is well-developed. She is not ill-appearing or diaphoretic.  HENT:     Head: Normocephalic and atraumatic.  Eyes:     General: Vision grossly intact. Gaze aligned appropriately.  Neck:     Trachea: Trachea and phonation normal.  Pulmonary:     Effort: Pulmonary effort is normal. No respiratory distress.  Musculoskeletal:        General: Normal range of motion.     Cervical back: Normal range of motion.  Skin:    General: Skin is warm and dry.  Neurological:     Mental Status: She is alert.     GCS: GCS eye subscore is 4. GCS verbal subscore is 5. GCS motor subscore is 6.     Comments: Speech is clear and goal oriented, follows commands Major Cranial nerves without deficit, no facial droop Moves extremities without ataxia, coordination intact  Psychiatric:        Behavior: Behavior normal.     ED Course/Procedures     Procedures  MDM  Delta troponin is 4, initial troponin is 5, reassuring. BNP of 122.5 is improved from prior. CBC shows hemoglobin 8.4 which is improved from a month ago, no leukocytosis. CMP shows hyponatremia at 121, chloride 90, no AKI, LFTs reassuring, no gap. PT/INR slightly elevated.  Hyponatremia/hypochloremia appears baseline.  CXR:  IMPRESSION:  No acute abnormality noted.   EKG: Sinus  rhythm Nonspecific intraventricular conduction delay No STEMI Confirmed by Nanda Quinton [phone]) on 09/19/2019 1:29:34 AM ------------------- Patient reassessed she is resting comfortably in bed no acute distress reports she was feeling well and would like to be discharged.  She was encouraged to follow-up with her cardiologist Dr. Debara Pickett and to return to the [place] apartment for any concerns.  Patient made aware of laboratory findings as above and plans to follow-up with her regular specialist.   At this time there does not appear to be any evidence of an acute emergency medical condition and the patient appears stable for discharge with appropriate outpatient follow up. Diagnosis was discussed with patient who verbalizes understanding of care plan and is agreeable to discharge. I have discussed return precautions with patient who verbalizes understanding. Patient encouraged to follow-up with their PCP and cardiology. All questions answered.  I discussed patient's case discussed with Dr. Laverta Baltimore who agrees with plan to discharge with outpatient follow-up.   Note: Portions of this report may have been transcribed using voice recognition software. Every effort was made to ensure accuracy; however, inadvertent computerized transcription errors may still be present.   Deliah Boston, PA-C 09/19/19 1157    Margette Fast, MD 09/22/19 [phone]

## 2019-09-19 NOTE — ED Provider Notes (Signed)
[facility] EMERGENCY DEPARTMENT Provider Note   CSN: [id number] Arrival date & time: 09/19/19  0124     History Chief Complaint  Patient presents with  . Chest Pain    Christine Butler is a 49 y.o. female.  The history is provided by the patient and medical records.  Chest Pain   49 y.o. F with hx of CAD s/p CABG x2, GERD, IBS, primary biliary cirrhosis currently awaiting transplant at [facility], presenting to the ED with chest pain.  States she was just at home lying in bed watching TV when it began.  States it was sharp to her left side and a little bit into her left neck.  Denies left arm pain.  She did feel like her heart was beating irregularly but has since normalized.  She denies any significant SOB, this waxes and wanes a lot of because her underlying liver disease.  No fever, cough, or other infectious symptoms.  She was transiently taken off her lasix due to hyponatremia but is now back on it.  States her swelling is controlled well at this point. BP 91/49 which she reports is baseline for her.  She is followed by cardiology here in [place], Dr. Debara Pickett.  Past Medical History:  Diagnosis Date  . Arthritis    right knee  . Ascites--mild this admit 11/06/2017   per patient , now stable with meds and lifestlye adjustment  . CAD (coronary artery disease)    a. s/p CABGx2 in 02/2017 (LAD not suitable for PCI), EF normal.  . Familial hyperlipidemia   . GERD (gastroesophageal reflux disease)   . IBS (irritable bowel syndrome)   . PONV (postoperative nausea and vomiting)    i always throw up on waking up , but last EGD in march had no issues    . Primary biliary cirrhosis (HCC)    cirhosis/liver disease followed by transplant team led by Dr Manuella Ghazi at [facility]   . S/P CABG (coronary artery bypass graft)   . SVD (spontaneous vaginal delivery)    x 3  . Urinary tract bacterial infections     Patient Active Problem List   Diagnosis Date Noted  . Upper GI bleed  02/01/2018  . GI bleed 02/01/2018  . Ascites--mild this admit 11/06/2017  . Atypical chest pain 11/05/2017  . Dyslipidemia, goal LDL below 70 10/13/2017  . Acute blood loss anemia 04/20/2017  . Elevated LFTs 04/20/2017  . Coronary artery disease 03/24/2017  . S/P CABG x 2 03/24/2017  . CAD (coronary artery disease), native coronary artery 03/23/2017  . Biliary cirrhosis ([place]) 03/16/2017  . GERD (gastroesophageal reflux disease) 01/28/2012  . Primary biliary cirrhosis ([place]) 01/28/2012  . IBS (irritable bowel syndrome) 01/28/2012    Past Surgical History:  Procedure Laterality Date  . CORONARY ARTERY BYPASS GRAFT N/A 03/24/2017   Procedure: CORONARY ARTERY BYPASS GRAFTING (CABG) x two, using left internal mammary artery, left radial artery, and right leg greater saphenous vein harvested endoscopically;  Surgeon: Ivin Poot, MD;  Location: [facility];  Service: Open Heart Surgery;  Laterality: N/A;  . ENDOVEIN HARVEST OF GREATER SAPHENOUS VEIN Right 03/24/2017   Procedure: ENDOVEIN HARVEST OF GREATER SAPHENOUS VEIN;  Surgeon: Ivin Poot, MD;  Location: [facility];  Service: Open Heart Surgery;  Laterality: Right;  . ESOPHAGEAL BANDING  02/01/2018   Procedure: ESOPHAGEAL BANDING;  Surgeon: [name], MD;  Location: [facility];  Service: Gastroenterology;;  . ESOPHAGEAL BANDING N/A 03/29/2018   Procedure:  ESOPHAGEAL BANDING;  Surgeon: [name], MD;  Location: [facility];  Service: Gastroenterology;  Laterality: N/A;  . ESOPHAGEAL BANDING N/A 05/21/2018   Procedure: ESOPHAGEAL BANDING;  Surgeon: [name], MD;  Location: [facility];  Service: Gastroenterology;  Laterality: N/A;  . ESOPHAGEAL BANDING N/A 10/11/2018   Procedure: ESOPHAGEAL BANDING;  Surgeon: [name], MD;  Location: [facility];  Service: Gastroenterology;  Laterality: N/A;  . ESOPHAGOGASTRODUODENOSCOPY N/A 03/29/2018   Procedure: ESOPHAGOGASTRODUODENOSCOPY (EGD);  Surgeon: [name], MD;  Location: [facility];   Service: Gastroenterology;  Laterality: N/A;  . ESOPHAGOGASTRODUODENOSCOPY N/A 05/21/2018   Procedure: ESOPHAGOGASTRODUODENOSCOPY (EGD);  Surgeon: [name], MD;  Location: [facility];  Service: Gastroenterology;  Laterality: N/A;  . ESOPHAGOGASTRODUODENOSCOPY (EGD) WITH PROPOFOL N/A 02/01/2018   Procedure: ESOPHAGOGASTRODUODENOSCOPY (EGD) WITH PROPOFOL;  Surgeon: [name], MD;  Location: [facility];  Service: Gastroenterology;  Laterality: N/A;  . ESOPHAGOGASTRODUODENOSCOPY (EGD) WITH PROPOFOL N/A 10/11/2018   Procedure: ESOPHAGOGASTRODUODENOSCOPY (EGD) WITH PROPOFOL;  Surgeon: [name], MD;  Location: [facility];  Service: Gastroenterology;  Laterality: N/A;  . INCONTINENCE SURGERY    . IR IMAGING GUIDED PORT INSERTION  04/08/2019  . LEEP N/A 03/28/2014   Procedure: LOOP ELECTROSURGICAL EXCISION PROCEDURE (LEEP) cone biopsy;  Surgeon: Cyril Mourning, MD;  Location: [facility];  Service: Gynecology;  Laterality: N/A;  . LEFT HEART CATH AND CORONARY ANGIOGRAPHY N/A 03/23/2017   Procedure: LEFT HEART CATH AND CORONARY ANGIOGRAPHY;  Surgeon: Jettie Booze, MD;  Location: [facility];  Service: Cardiovascular;  Laterality: N/A;  . LIVER BIOPSY     x 2  . RADIAL ARTERY HARVEST Left 03/24/2017   Procedure: RADIAL ARTERY HARVEST;  Surgeon: Ivin Poot, MD;  Location: [facility];  Service: Open Heart Surgery;  Laterality: Left;  . TEE WITHOUT CARDIOVERSION N/A 03/24/2017   Procedure: TRANSESOPHAGEAL ECHOCARDIOGRAM (TEE);  Surgeon: Prescott Gum, Collier Salina, MD;  Location: [facility];  Service: Open Heart Surgery;  Laterality: N/A;  . WISDOM TOOTH EXTRACTION       OB History    Gravida  3   Para  3   Term      Preterm      AB      Living        SAB      TAB      Ectopic      Multiple      Live Births           Obstetric Comments  None        Family History  Problem Relation Age of Onset  . CAD Father   . Diabetes Mellitus II Father   . Heart disease Father   .  Heart attack Brother   . Heart disease Maternal Aunt   . Heart attack Paternal Grandmother   . Heart attack Paternal Grandfather     Social History   Tobacco Use  . Smoking status: Never Smoker  . Smokeless tobacco: Never Used  Vaping Use  . Vaping Use: Never used  Substance Use Topics  . Alcohol use: Not Currently    Alcohol/week: 1.0 standard drink    Types: 1 Glasses of wine per week    Comment: for dinner  . Drug use: No    Home Medications Prior to Admission medications   Medication Sig Start Date End Date Taking? Authorizing Provider  Alirocumab (PRALUENT) 150 MG/ML SOAJ Inject 1 Dose into the skin every 14 (fourteen) days. 08/03/19   Hilty, Nadean Corwin, MD  aspirin 81 MG EC tablet Take 81 mg by mouth daily. Swallow whole.    [provider]  calcium carbonate (TUMS - DOSED IN MG ELEMENTAL CALCIUM) 500 MG chewable tablet Chew 2 tablets by mouth 3 (three) times daily as needed for indigestion or heartburn.     [provider]  calcium-vitamin D (OSCAL WITH D) 500-200 MG-UNIT tablet Take 2 tablets by mouth daily.    [provider]  ciprofloxacin (CIPRO) 500 MG tablet Take 500 mg by mouth daily.    [provider]  cycloSPORINE (RESTASIS) 0.05 % ophthalmic emulsion Place 1 drop into both eyes 2 (two) times daily.  11/24/18   [provider]  fluticasone (FLONASE) 50 MCG/ACT nasal spray Place 1 spray into both nostrils daily as needed for allergies or rhinitis.     [provider]  furosemide (LASIX) 20 MG tablet Take 1 tablet (20 mg total) by mouth daily. Patient not taking: Reported on 08/07/2019 11/06/17   Isaiah Serge, NP  furosemide (LASIX) 40 MG tablet Take 40 mg by mouth daily. Take 3 tablets (total of 120 mg ) daily    [provider]  hydrocortisone ointment 0.5 % Apply 1 application topically 2 (two) times daily as needed for itching.    [provider]  hydrOXYzine (ATARAX/VISTARIL) 25 MG tablet Take  25 mg by mouth at bedtime as needed for itching.     [provider]  lactulose (CEPHULAC) 20 g packet Take 20 g by mouth 3 (three) times daily.    [provider]  lactulose (CHRONULAC) 10 GM/15ML solution Take 30 g by mouth daily.     [provider]  lidocaine-prilocaine (EMLA) cream Apply 1 application topically as needed (port).    [provider]  melatonin 3 MG TABS tablet Take 3 mg by mouth at bedtime as needed.    [provider]  metoprolol tartrate (LOPRESSOR) 25 MG tablet Take 0.5 tablets (12.5 mg total) by mouth 2 (two) times daily. Patient not taking: Reported on 08/07/2019 09/20/18   Revankar, Reita Cliche, MD  Multiple Vitamins-Minerals (DEKAS PLUS PO) Take 1 capsule by mouth daily.    [provider]  nitroGLYCERIN (NITROSTAT) 0.4 MG SL tablet Place 1 tablet (0.4 mg total) under the tongue every 5 (five) minutes as needed for chest pain. 05/12/17 05/10/19  Ward, Ozella Almond, PA-C  omeprazole (PRILOSEC) 20 MG capsule Take 20 mg by mouth daily.    [provider]  ondansetron (ZOFRAN ODT) 4 MG disintegrating tablet Take 1 tablet (4 mg total) by mouth every 8 (eight) hours as needed for nausea or vomiting. 01/26/19   Lucrezia Starch, MD  pantoprazole (PROTONIX) 40 MG tablet Take 40 mg by mouth daily.    [provider]  Probiotic Product (PROBIOTIC PO) Take 1 capsule by mouth daily.     [provider]  spironolactone (ALDACTONE) 100 MG tablet Take 200 mg by mouth daily.    [provider]  traZODone (DESYREL) 50 MG tablet Take 50 mg by mouth at bedtime. Patient not taking: Reported on 08/22/2019    [provider]  ursodiol (ACTIGALL) 300 MG capsule Take 300 mg by mouth 3 (three) times daily.     [provider]    Allergies    Codeine, Erythromycin, and Fentanyl  Review of Systems   Review of Systems  Cardiovascular: Positive for chest pain.  All other systems reviewed and  are negative.   Physical Exam Updated  Vital Signs BP (!) 91/49   Pulse 83   Temp (!) 97.5 F (36.4 C) (Oral)   Resp 19   Ht 5\' 2"  (1.575 m)   Wt 61.2 kg   BMI 24.68 kg/m   Physical Exam Vitals and nursing note reviewed.  Constitutional:      Appearance: She is well-developed.  HENT:     Head: Normocephalic and atraumatic.  Eyes:     General: Scleral icterus (chronic) present.     Conjunctiva/sclera: Conjunctivae normal.     Pupils: Pupils are equal, round, and reactive to light.  Cardiovascular:     Rate and Rhythm: Normal rate and regular rhythm.  Pulmonary:     Effort: Pulmonary effort is normal.     Breath sounds: Normal breath sounds. No wheezing or rhonchi.     Comments: Port right chest wall, clean Abdominal:     General: Bowel sounds are normal.     Palpations: Abdomen is soft.     Comments: Soft, non-tender  Musculoskeletal:        General: Normal range of motion.     Cervical back: Normal range of motion.     Comments: No peripheral edema  Skin:    General: Skin is warm and dry.     Coloration: Skin is jaundiced (chronic).  Neurological:     Mental Status: She is alert and oriented to person, place, and time.     ED Results / Procedures / Treatments   Labs (all labs ordered are listed, but only abnormal results are displayed) Labs Reviewed  CBC WITH DIFFERENTIAL/PLATELET - Abnormal; Notable for the following components:      Result Value   RBC 2.55 (*)    Hemoglobin 8.4 (*)    HCT 24.0 (*)    RDW 16.2 (*)    Platelets 114 (*)    All other components within normal limits  COMPREHENSIVE METABOLIC PANEL - Abnormal; Notable for the following components:   Sodium 121 (*)    Chloride 90 (*)    CO2 20 (*)    Glucose, Bld 112 (*)    Calcium 8.8 (*)    Total Protein 6.4 (*)    AST 60 (*)    Total Bilirubin 6.7 (*)    All other components within normal limits  PROTIME-INR - Abnormal; Notable for the following components:   Prothrombin Time 17.3 (*)     INR 1.5 (*)    All other components within normal limits  BRAIN NATRIURETIC PEPTIDE - Abnormal; Notable for the following components:   B Natriuretic Peptide 122.5 (*)    All other components within normal limits  TROPONIN I (HIGH SENSITIVITY)  TROPONIN I (HIGH SENSITIVITY)    EKG EKG Interpretation  Date/Time:  Monday September 19 2019 01:25:21 EDT Ventricular Rate:  82 PR Interval:    QRS Duration: 122 QT Interval:  411 QTC Calculation: 480 R Axis:   72 Text Interpretation: Sinus rhythm Nonspecific intraventricular conduction delay No STEMI Confirmed by Nanda Quinton [phone]) on 09/19/2019 1:29:34 AM   Radiology DG Chest 2 View  Result Date: 09/19/2019 CLINICAL DATA:  Chest pain shortness of breath EXAM: CHEST - 2 VIEW COMPARISON:  08/06/2019 FINDINGS: Heart is at the upper limits of normal in size. Postsurgical changes are noted. Right chest wall port is seen in satisfactory position. The lungs are well aerated bilaterally. No focal infiltrate is seen. No bony abnormality is noted. IMPRESSION: No acute abnormality noted. Electronically Signed   By: Elta Guadeloupe  Lukens M.D.   On: 09/19/2019 02:31    Procedures Procedures (including critical care time)  Medications Ordered in ED Medications - No data to display  ED Course  I have reviewed the triage vital signs and the nursing notes.  Pertinent labs & imaging results that were available during my care of the patient were reviewed by me and considered in my medical decision making (see chart for details).    MDM Rules/Calculators/A&P  49 year old female presenting to the ED with chest pain.  States this began while lying in bed watching TV.  Pain mostly seems to have dissipated by time of my evaluation.  She does have history of advanced liver disease, currently awaiting transplant with team at [facility].  No recent issues relating to this.  She is afebrile and nontoxic.  Hemodynamically stable, blood pressure is somewhat soft into the 90s  but reports this is her baseline.  She does not have any significant abdominal swelling on exam.  No peripheral edema.  Labs were obtained, does have hyponatremia, but this is chronic and appears around her baseline when compared with prior.  Initial troponin is negative.  Chest x-ray is clear.  Patient was reassessed, continues feeling well, was able to doze off and sleep a little here.  Has been is now at the bedside.  I have reviewed her results with her thus far.  We will plan to obtain delta troponin and if negative, feel she will be stable for discharge to follow-up with her cardiologist.  Patient is agreeable and desiring discharge if possible.  6:38 AM Delta trop pending (delay in lab draw).  If negative, plan to d/c home with cardiology follow-up.  She has appt next month but encouraged to call for possible earlier appointment.  Case discussed with attending physician, Dr. Laverta Baltimore who agrees with plan of care.  Final Clinical Impression(s) / ED Diagnoses Final diagnoses:  Chest pain in adult    Rx / DC Orders ED Discharge Orders    None       Larene Pickett, PA-C 09/19/19 0640    Margette Fast, MD 09/22/19 [phone]

## 2019-09-19 NOTE — Discharge Instructions (Addendum)
Continue all usual medications. Follow-up with Dr. Sammuel Cooper would contact his office to see if you can try to get an earlier appt. Return here for new concerns.    At this time there does not appear to be the presence of an emergent medical condition, however there is always the potential for conditions to change. Please read and follow the below instructions.  Please return to the Emergency Department immediately for any new or worsening symptoms. Please be sure to follow up with your Primary Care Provider within one week regarding your visit today; please call their office to schedule an appointment even if you are feeling better for a follow-up visit.   Please read the additional information packets attached to your discharge summary.  Do not take your medicine if  develop an itchy rash, swelling in your mouth or lips, or difficulty breathing; call 911 and seek immediate emergency medical attention if this occurs.  You may review your lab tests and imaging results in their entirety on your MyChart account.  Please discuss all results of fully with your primary care provider and other specialist at your follow-up visit.  Note: Portions of this text may have been transcribed using voice recognition software. Every effort was made to ensure accuracy; however, inadvertent computerized transcription errors may still be present.

## 2019-09-20 ENCOUNTER — Other Ambulatory Visit: Payer: Self-pay

## 2019-09-20 ENCOUNTER — Telehealth: Payer: Self-pay | Admitting: Internal Medicine

## 2019-09-20 ENCOUNTER — Encounter (HOSPITAL_COMMUNITY)
Admission: RE | Admit: 2019-09-20 | Discharge: 2019-09-20 | Disposition: A | Discharge status: Home / Self Care | Payer: 59 | Source: Ambulatory Visit | Attending: Transplant Hepatology | Admitting: Transplant Hepatology

## 2019-09-20 DIAGNOSIS — K7469 Other cirrhosis of liver: Secondary | ICD-10-CM | POA: Diagnosis not present

## 2019-09-20 LAB — COMPREHENSIVE METABOLIC PANEL
ALT: 38 U/L (ref 0–44)
AST: 57 U/L — ABNORMAL HIGH (ref 15–41)
Albumin: 3.5 g/dL (ref 3.5–5.0)
Alkaline Phosphatase: 110 U/L (ref 38–126)
Anion gap: 10 (ref 5–15)
BUN: 17 mg/dL (ref 6–20)
CO2: 22 mmol/L (ref 22–32)
Calcium: 9 mg/dL (ref 8.9–10.3)
Chloride: 90 mmol/L — ABNORMAL LOW (ref 98–111)
Creatinine, Ser: 0.83 mg/dL (ref 0.44–1.00)
GFR calc Af Amer: >60 mL/min (ref >60)
GFR calc non Af Amer: >60 mL/min (ref >60)
Glucose, Bld: 124 mg/dL — ABNORMAL HIGH (ref 70–99)
Potassium: 4.2 mmol/L (ref 3.5–5.1)
Sodium: 122 mmol/L — ABNORMAL LOW (ref 135–145)
Total Bilirubin: 7 mg/dL — ABNORMAL HIGH (ref 0.3–1.2)
Total Protein: 6.4 g/dL — ABNORMAL LOW (ref 6.5–8.1)

## 2019-09-20 LAB — PROTIME-INR
INR: 1.4 — ABNORMAL HIGH (ref 0.8–1.2)
Prothrombin Time: 16.5 seconds — ABNORMAL HIGH (ref 11.4–15.2)

## 2019-09-20 MED ORDER — FUROSEMIDE 10 MG/ML IJ SOLN
INTRAMUSCULAR | Status: AC
  Administered 2019-09-20: 40 mg via INTRAVENOUS
  Filled 2019-09-20: qty 4

## 2019-09-20 MED ORDER — HEPARIN SOD (PORK) LOCK FLUSH 100 UNIT/ML IV SOLN: 500.0000 [IU] | INTRAVENOUS | Status: DC | PRN

## 2019-09-20 MED ORDER — ALBUMIN HUMAN 25 % IV SOLN
50.0000 g | INTRAVENOUS | Status: DC
  Administered 2019-09-20: 50 g via INTRAVENOUS
  Filled 2019-09-20: qty 200

## 2019-09-20 MED ORDER — FUROSEMIDE 10 MG/ML IJ SOLN: 40.0000 mg | INTRAMUSCULAR | Status: DC

## 2019-09-20 MED ORDER — HEPARIN SOD (PORK) LOCK FLUSH 100 UNIT/ML IV SOLN
INTRAVENOUS | Status: AC
  Administered 2019-09-20: 500 [IU]
  Filled 2019-09-20: qty 5

## 2019-09-20 MED ORDER — HEPARIN SOD (PORK) LOCK FLUSH 100 UNIT/ML IV SOLN: 500.0000 [IU] | INTRAVENOUS | Status: DC

## 2019-09-20 NOTE — Telephone Encounter (Signed)
Spoke with patient who is requesting sooner lipid clinic f/u since she was just in hospital. Explained she has OV with general cardiologist this week on 7/29 and her currently scheduled visit with Dr. Debara Pickett 8/19 is the earliest available. She also states she has not had a fasting lipid panel done. Advised this would need to be done before f/u with Dr. Debara Pickett. She plans to get this done at [facility] on [address]. No further assistance needed.

## 2019-09-20 NOTE — Telephone Encounter (Signed)
New message   Patient is calling in to get a sooner appt with Dr. Debara Pickett. Informed patient that she is scheduled for the soonest lipid appointment. Patient would like to know if she can be fit in sooner due to her still having symptoms after being discharged from the hospital. Or if she can see a PA for the lipid appointment. Please assist.

## 2019-09-21 DIAGNOSIS — Z9889 Other specified postprocedural states: Secondary | ICD-10-CM | POA: Insufficient documentation

## 2019-09-21 DIAGNOSIS — A499 Bacterial infection, unspecified: Secondary | ICD-10-CM | POA: Insufficient documentation

## 2019-09-21 DIAGNOSIS — M9901 Segmental and somatic dysfunction of cervical region: Secondary | ICD-10-CM | POA: Diagnosis not present

## 2019-09-21 DIAGNOSIS — M41123 Adolescent idiopathic scoliosis, cervicothoracic region: Secondary | ICD-10-CM | POA: Diagnosis not present

## 2019-09-21 DIAGNOSIS — R112 Nausea with vomiting, unspecified: Secondary | ICD-10-CM | POA: Insufficient documentation

## 2019-09-21 DIAGNOSIS — M9905 Segmental and somatic dysfunction of pelvic region: Secondary | ICD-10-CM | POA: Diagnosis not present

## 2019-09-21 DIAGNOSIS — M9903 Segmental and somatic dysfunction of lumbar region: Secondary | ICD-10-CM | POA: Diagnosis not present

## 2019-09-21 DIAGNOSIS — M1990 Unspecified osteoarthritis, unspecified site: Secondary | ICD-10-CM | POA: Insufficient documentation

## 2019-09-21 DIAGNOSIS — I2510 Atherosclerotic heart disease of native coronary artery without angina pectoris: Secondary | ICD-10-CM | POA: Insufficient documentation

## 2019-09-22 ENCOUNTER — Ambulatory Visit (INDEPENDENT_AMBULATORY_CARE_PROVIDER_SITE_OTHER): Payer: 59 | Admitting: Cardiology

## 2019-09-22 ENCOUNTER — Other Ambulatory Visit: Payer: Self-pay

## 2019-09-22 ENCOUNTER — Encounter: Payer: Self-pay | Admitting: Cardiology

## 2019-09-22 VITALS — BP 88/38 | HR 72 | Ht 66.0 in | Wt 141.2 lb

## 2019-09-22 DIAGNOSIS — E785 Hyperlipidemia, unspecified: Secondary | ICD-10-CM

## 2019-09-22 DIAGNOSIS — I2510 Atherosclerotic heart disease of native coronary artery without angina pectoris: Secondary | ICD-10-CM | POA: Diagnosis not present

## 2019-09-22 DIAGNOSIS — I5030 Unspecified diastolic (congestive) heart failure: Secondary | ICD-10-CM

## 2019-09-22 DIAGNOSIS — Z951 Presence of aortocoronary bypass graft: Secondary | ICD-10-CM | POA: Diagnosis not present

## 2019-09-22 MED ORDER — NITROGLYCERIN 0.4 MG SUBLINGUAL TABLET
0.00000 days | Status: SS
Start: 2019-09-22 — End: ?

## 2019-09-22 MED ORDER — NITROGLYCERIN 0.4 MG SL SUBL: 0.4000 mg | SUBLINGUAL_TABLET | SUBLINGUAL | 6 refills | Status: DC | PRN

## 2019-09-22 MED FILL — NITROGLYCERIN 0.4 MG TAB SL: 0.4 | 8 days supply | Qty: 25 | Fill #0

## 2019-09-22 NOTE — Patient Instructions (Signed)

## 2019-09-22 NOTE — Progress Notes (Signed)
Cardiology Office Note:    Date:  09/22/2019   ID:  Christine Butler, DOB 1971/01/30, MRN [id number]  PCP:  Christine Leriche, PA-C  Cardiologist:  Christine Lindau, MD   Referring MD: Christine Leriche, PA-C    ASSESSMENT:    1. Heart failure with preserved ejection fraction, unspecified HF chronicity (Christine Butler)   2. Coronary artery disease involving native coronary artery of native heart without angina pectoris   3. Dyslipidemia, goal LDL below 70   4. S/P CABG x 2    PLAN:    In order of problems listed above:  1. Coronary artery disease: Secondary prevention stressed with the patient.  Importance of compliance with diet medication stressed and she vocalized understanding.  Her chest pain appears atypical and I agree with emergency room records and evaluation.  Since hospital discharge she is felt fine.  She is ambulating within the house. 2. Borderline blood pressure: Asymptomatic at this time and I told her to monitor it.  Beta-blocker has been discontinued and I agree with that at this point. 3. Dyslipidemia: On therapy.  Diet was emphasized 4. Cirrhosis and hepatic failure on transplant list at [facility].  She tells me that she is very close to getting a transplant and is awaiting call from those doctors.  I wished her well. 5. Patient will be seen in follow-up appointment in 3 months or earlier if the patient has any concerns    Medication Adjustments/Labs and Tests Ordered: Current medicines are reviewed at length with the patient today.  Concerns regarding medicines are outlined above.  No orders of the defined types were placed in this encounter.  No orders of the defined types were placed in this encounter.    No chief complaint on file.    History of Present Illness:    Christine Butler is a 49 y.o. female.  Patient has past medical history of coronary disease post CABG surgery preserved systolic function heart failure and is a patient for renal transplant.  She has  cirrhosis.  She has marked dyslipidemia.  She mentions to me that she went to the emergency room.  She had chest pain.  She feels that she might have had a panic reaction.  At the time of my evaluation, the patient is alert awake oriented and in no distress.  Past Medical History:  Diagnosis Date  . (HFpEF) heart failure with preserved ejection fraction ([place]) 08/09/2019  . Acute blood loss anemia 04/20/2017  . Arthritis    right knee  . Ascites--mild this admit 11/06/2017   per patient , now stable with meds and lifestlye adjustment  . Atypical chest pain 11/05/2017  . Atypical squamous cells of undetermined significance (ASCUS) on Papanicolaou smear of cervix 08/05/2018  . CAD (coronary artery disease)    a. s/p CABGx2 in 02/2017 (LAD not suitable for PCI), EF normal.  . CAD (coronary artery disease), native coronary artery 03/23/2017  . 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 Cirrhosis ([place]) 03/16/2017  . Coronary artery disease 03/24/2017  . Dyslipidemia, goal LDL below 70 10/13/2017  . Elevated LFTs 04/20/2017  . Familial hyperlipidemia   . GERD (gastroesophageal reflux disease)   . GI bleed 02/01/2018  . H/O LEEP 03/30/2019  . H/O two vessel coronary artery bypass graft 03/30/2019  . High grade squamous intraepithelial lesion (HGSIL) on cytologic smear of cervix 05/04/2019  . Hypo-osmolality and hyponatremia 03/30/2019  . IBS (irritable bowel syndrome)   . Other insomnia 09/05/2019  . PONV (  postoperative nausea and vomiting)    i always throw up on waking up , but last EGD in march had no issues    . 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 IMOUPDATE  . Primary biliary cirrhosis (HCC)    cirhosis/liver disease followed by transplant team led by Dr Manuella Ghazi at [facility]   . S/P CABG (coronary artery bypass graft)   . S/P CABG x 2 03/24/2017   LIMA to DIAGONAL Portion of SVG/LEFT RADIAL to LAD  . SVD (spontaneous vaginal delivery)    x 3    . Upper GI bleed 02/01/2018  . Urinary tract bacterial infections     Past Surgical History:  Procedure Laterality Date  . CORONARY ARTERY BYPASS GRAFT N/A 03/24/2017   Procedure: CORONARY ARTERY BYPASS GRAFTING (CABG) x two, using left internal mammary artery, left radial artery, and right leg greater saphenous vein harvested endoscopically;  Surgeon: Ivin Poot, MD;  Location: [place];  Service: Open Heart Surgery;  Laterality: N/A;  . ENDOVEIN HARVEST OF GREATER SAPHENOUS VEIN Right 03/24/2017   Procedure: ENDOVEIN HARVEST OF GREATER SAPHENOUS VEIN;  Surgeon: Ivin Poot, MD;  Location: [place];  Service: Open Heart Surgery;  Laterality: Right;  . ESOPHAGEAL BANDING  02/01/2018   Procedure: ESOPHAGEAL BANDING;  Surgeon: [name], MD;  Location[facility];  Service: Gastroenterology;;  . ESOPHAGEAL BANDING N/A 03/29/2018   Procedure: ESOPHAGEAL BANDING;  Surgeon: [name], MD;  Location: [facility];  Service: Gastroenterology;  Laterality: N/A;  . ESOPHAGEAL BANDING N/A 05/21/2018   Procedure: ESOPHAGEAL BANDING;  Surgeon: [name], MD;  Location: [facility];  Service: Gastroenterology;  Laterality: N/A;  . ESOPHAGEAL BANDING N/A 10/11/2018   Procedure: ESOPHAGEAL BANDING;  Surgeon: [name], MD;  [facility];  Service: Gastroenterology;  Laterality: N/A;  . ESOPHAGOGASTRODUODENOSCOPY N/A 03/29/2018   Procedure: ESOPHAGOGASTRODUODENOSCOPY (EGD);  Surgeon: [name], MD;  Location: [facility];  Service: Gastroenterology;  Laterality: N/A;  . ESOPHAGOGASTRODUODENOSCOPY N/A 05/21/2018   Procedure: ESOPHAGOGASTRODUODENOSCOPY (EGD);  Surgeon: [name], MD;  Location: [facility];  Service: Gastroenterology;  Laterality: N/A;  . ESOPHAGOGASTRODUODENOSCOPY (EGD) WITH PROPOFOL N/A 02/01/2018   Procedure: ESOPHAGOGASTRODUODENOSCOPY (EGD) WITH PROPOFOL;  Surgeon: [name], MD;  [facility];  Service: Gastroenterology;  Laterality: N/A;  .  ESOPHAGOGASTRODUODENOSCOPY (EGD) WITH PROPOFOL N/A 10/11/2018   Procedure: ESOPHAGOGASTRODUODENOSCOPY (EGD) WITH PROPOFOL;  Surgeon: [name], MD;  Location: [facility];  Service: Gastroenterology;  Laterality: N/A;  . INCONTINENCE SURGERY    . IR IMAGING GUIDED PORT INSERTION  04/08/2019  . LEEP N/A 03/28/2014   Procedure: LOOP ELECTROSURGICAL EXCISION PROCEDURE (LEEP) cone biopsy;  Surgeon: Cyril Mourning, MD;  Location: [facility];  Service: Gynecology;  Laterality: N/A;  . LEFT HEART CATH AND CORONARY ANGIOGRAPHY N/A 03/23/2017   Procedure: LEFT HEART CATH AND CORONARY ANGIOGRAPHY;  Surgeon: Jettie Booze, MD;  Location: [place];  Service: Cardiovascular;  Laterality: N/A;  . LIVER BIOPSY     x 2  . RADIAL ARTERY HARVEST Left 03/24/2017   Procedure: RADIAL ARTERY HARVEST;  Surgeon: Ivin Poot, MD;  Location: [place];  Service: Open Heart Surgery;  Laterality: Left;  . TEE WITHOUT CARDIOVERSION N/A 03/24/2017   Procedure: TRANSESOPHAGEAL ECHOCARDIOGRAM (TEE);  Surgeon: Prescott Gum, Collier Salina, MD;  Location: [place];  Service: Open Heart Surgery;  Laterality: N/A;  . WISDOM TOOTH EXTRACTION      Current Medications: Current  Meds  Medication Sig  . Alirocumab (PRALUENT) 150 MG/ML SOAJ Inject 1 Dose into the skin every 14 (fourteen) days.  . calcium carbonate (TUMS - DOSED IN MG ELEMENTAL CALCIUM) 500 MG chewable tablet Chew 2 tablets by mouth 3 (three) times daily as needed for indigestion or heartburn.   . calcium-vitamin D (OSCAL WITH D) 500-200 MG-UNIT tablet Take 2 tablets by mouth daily.  . ciprofloxacin (CIPRO) 500 MG tablet Take 500 mg by mouth daily.  . cycloSPORINE (RESTASIS) 0.05 % ophthalmic emulsion Place 1 drop into both eyes at bedtime.   . fluticasone (FLONASE) 50 MCG/ACT nasal spray Place 1 spray into both nostrils daily as needed for allergies or rhinitis.   . furosemide (LASIX) 40 MG tablet Take 100 mg by mouth daily.  . hydrOXYzine (ATARAX/VISTARIL) 25 MG  tablet Take 25 mg by mouth at bedtime as needed for itching.   . lactulose (CHRONULAC) 10 GM/15ML solution Take 30 g by mouth 3 (three) times daily.   Marland Kitchen lidocaine-prilocaine (EMLA) cream Apply 1 application topically as needed (port).  . melatonin 3 MG TABS tablet Take 3 mg by mouth at bedtime.   . nitroGLYCERIN (NITROSTAT) 0.4 MG SL tablet Place 1 tablet (0.4 mg total) under the tongue every 5 (five) minutes as needed for chest pain.  Marland Kitchen ondansetron (ZOFRAN ODT) 4 MG disintegrating tablet Take 1 tablet (4 mg total) by mouth every 8 (eight) hours as needed for nausea or vomiting.  . pantoprazole (PROTONIX) 40 MG tablet Take 40 mg by mouth daily.  . Probiotic Product (PROBIOTIC PO) Take 1 capsule by mouth daily.   Marland Kitchen spironolactone (ALDACTONE) 100 MG tablet Take 100 mg by mouth daily.   . ursodiol (ACTIGALL) 300 MG capsule Take 900 mg by mouth daily.      Allergies:   Codeine, Erythromycin, and Fentanyl   Social History   Socioeconomic History  . Marital status: Divorced    Spouse name: Not on file  . Number of children: 3  . Years of education: 22  . Highest education level: Associate degree: academic program  Occupational History  . Not on file  Tobacco Use  . Smoking status: Never Smoker  . Smokeless tobacco: Never Used  Vaping Use  . Vaping Use: Never used  Substance and Sexual Activity  . Alcohol use: Not Currently    Alcohol/week: 1.0 standard drink    Types: 1 Glasses of wine per week    Comment: for dinner  . Drug use: No  . Sexual activity: Yes    Partners: Male    Birth control/protection: None  Other Topics Concern  . Not on file  Social History Narrative  . Not on file   Social Determinants of Health   Financial Resource Strain:   . Difficulty of Paying Living Expenses:   Food Insecurity:   . Worried About Charity fundraiser in the Last Year:   . Arboriculturist in the Last Year:   Transportation Needs:   . Film/video editor (Medical):   Marland Kitchen Lack of  Transportation (Non-Medical):   Physical Activity:   . Days of Exercise per Week:   . Minutes of Exercise per Session:   Stress:   . Feeling of Stress :   Social Connections:   . Frequency of Communication with Friends and Family:   . Frequency of Social Gatherings with Friends and Family:   . Attends Religious Services:   . Active Member of Clubs or Organizations:   .  Attends Archivist Meetings:   Marland Kitchen Marital Status:      Family History: The patient's family history includes CAD in her father; Diabetes Mellitus II in her father; Heart attack in her brother, paternal grandfather, and paternal grandmother; Heart disease in her father and maternal aunt.  ROS:   Please see the history of present illness.    All other systems reviewed and are negative.  EKGs/Labs/Other Studies Reviewed:    The following studies were reviewed today: I discussed my findings with the patient at length and emergency room records were reviewed   Recent Labs: 09/19/2019: B Natriuretic Peptide 122.5; Hemoglobin 8.4; Platelets 114 09/20/2019: ALT 38; BUN 17; Creatinine, Ser 0.83; Potassium 4.2; Sodium 122  Recent Lipid Panel    Component Value Date/Time   CHOL 249 (H) 01/10/2019 0908   TRIG 124 01/10/2019 0908   HDL 15 (L) 01/10/2019 0908   CHOLHDL 16.6 (H) 01/10/2019 0908   CHOLHDL NOT CALCULATED 12/18/2017 0720   VLDL 38 12/18/2017 0720   LDLCALC 211 (H) 01/10/2019 0908   LDLDIRECT 229 (H) 12/30/2017 0856    Physical Exam:    VS:  BP (!) 88/38   Pulse 72   Ht 5\' 6"  (1.676 m)   Wt 141 lb 3.2 oz (64 kg)   SpO2 99%   BMI 22.79 kg/m     Wt Readings from Last 3 Encounters:  09/22/19 141 lb 3.2 oz (64 kg)  09/19/19 134 lb 14.7 oz (61.2 kg)  09/13/19 135 lb (61.2 kg)     GEN: Patient is in no acute distress HEENT: Normal NECK: No JVD; No carotid bruits LYMPHATICS: No lymphadenopathy CARDIAC: Hear sounds regular, 2/6 systolic murmur at the apex. RESPIRATORY:  Clear to  auscultation without rales, wheezing or rhonchi  ABDOMEN: Soft, non-tender, non-distended MUSCULOSKELETAL:  No edema; No deformity  SKIN: Warm and dry NEUROLOGIC:  Alert and oriented x 3 PSYCHIATRIC:  Normal affect   Signed, Christine Lindau, MD  09/22/2019 9:05 AM    [place]

## 2019-09-23 MED FILL — XIFAXAN 550 MG TABLET: 550 | 30 days supply | Qty: 60 | Fill #0

## 2019-09-25 HISTORY — PX: CHOLECYSTECTOMY: SHX55

## 2019-09-26 ENCOUNTER — Ambulatory Visit: Payer: 59 | Admitting: Cardiology

## 2019-09-26 MED ORDER — DOXEPIN 10 MG CAPSULE
ORAL_CAPSULE | Freq: Every evening | ORAL | 11 refills | 30 days | Status: CP
Start: 2019-09-26 — End: 2019-10-26

## 2019-09-26 MED FILL — DOXEPIN HCL 10 MG CAPS: 10 | 30 days supply | Qty: 30 | Fill #0

## 2019-09-27 ENCOUNTER — Other Ambulatory Visit: Payer: Self-pay

## 2019-09-27 ENCOUNTER — Encounter (HOSPITAL_COMMUNITY)
Admission: RE | Admit: 2019-09-27 | Discharge: 2019-09-27 | Disposition: A | Discharge status: Home / Self Care | Payer: 59 | Source: Ambulatory Visit | Attending: Transplant Hepatology | Admitting: Transplant Hepatology

## 2019-09-27 DIAGNOSIS — K7469 Other cirrhosis of liver: Secondary | ICD-10-CM | POA: Insufficient documentation

## 2019-09-27 LAB — COMPREHENSIVE METABOLIC PANEL
ALT: 44 U/L (ref 0–44)
AST: 68 U/L — ABNORMAL HIGH (ref 15–41)
Albumin: 4 g/dL (ref 3.5–5.0)
Alkaline Phosphatase: 146 U/L — ABNORMAL HIGH (ref 38–126)
Anion gap: 11 (ref 5–15)
BUN: 14 mg/dL (ref 6–20)
CO2: 21 mmol/L — ABNORMAL LOW (ref 22–32)
Calcium: 9.5 mg/dL (ref 8.9–10.3)
Chloride: 88 mmol/L — ABNORMAL LOW (ref 98–111)
Creatinine, Ser: 0.83 mg/dL (ref 0.44–1.00)
GFR calc Af Amer: >60 mL/min (ref >60)
GFR calc non Af Amer: >60 mL/min (ref >60)
Glucose, Bld: 116 mg/dL — ABNORMAL HIGH (ref 70–99)
Potassium: 4 mmol/L (ref 3.5–5.1)
Sodium: 120 mmol/L — ABNORMAL LOW (ref 135–145)
Total Bilirubin: 8.3 mg/dL — ABNORMAL HIGH (ref 0.3–1.2)
Total Protein: 7.3 g/dL (ref 6.5–8.1)

## 2019-09-27 LAB — PROTIME-INR
INR: 1.4 — ABNORMAL HIGH (ref 0.8–1.2)
Prothrombin Time: 16.3 seconds — ABNORMAL HIGH (ref 11.4–15.2)

## 2019-09-27 MED ORDER — HEPARIN SOD (PORK) LOCK FLUSH 100 UNIT/ML IV SOLN: 500.0000 [IU] | INTRAVENOUS | Status: DC

## 2019-09-27 MED ORDER — HEPARIN SOD (PORK) LOCK FLUSH 100 UNIT/ML IV SOLN
INTRAVENOUS | Status: AC
  Administered 2019-09-27: 500 [IU]
  Filled 2019-09-27: qty 5

## 2019-09-27 MED ORDER — FUROSEMIDE 10 MG/ML IJ SOLN
INTRAMUSCULAR | Status: AC
  Administered 2019-09-27: 40 mg via INTRAVENOUS
  Filled 2019-09-27: qty 4

## 2019-09-27 MED ORDER — HEPARIN SOD (PORK) LOCK FLUSH 100 UNIT/ML IV SOLN: 500.0000 [IU] | INTRAVENOUS | Status: DC | PRN

## 2019-09-27 MED ORDER — FUROSEMIDE 10 MG/ML IJ SOLN: 40.0000 mg | INTRAMUSCULAR | Status: DC

## 2019-09-27 MED ORDER — ALBUMIN HUMAN 25 % IV SOLN
50.0000 g | INTRAVENOUS | Status: DC
  Administered 2019-09-27: 50 g via INTRAVENOUS
  Filled 2019-09-27: qty 200

## 2019-09-28 ENCOUNTER — Ambulatory Visit
Admit: 2019-09-28 | Discharge: 2019-10-01 | Disposition: A | Discharge status: Home / Self Care | Payer: PRIVATE HEALTH INSURANCE | Source: Ambulatory Visit | Admitting: Internal Medicine

## 2019-09-28 ENCOUNTER — Ambulatory Visit: Admit: 2019-09-28 | Discharge: 2019-09-29 | Payer: PRIVATE HEALTH INSURANCE

## 2019-09-28 DIAGNOSIS — K7290 Hepatic failure, unspecified without coma: Principal | ICD-10-CM

## 2019-09-28 DIAGNOSIS — Z7682 Awaiting organ transplant status: Principal | ICD-10-CM

## 2019-09-28 DIAGNOSIS — K8301 Primary sclerosing cholangitis: Secondary | ICD-10-CM | POA: Diagnosis not present

## 2019-09-28 DIAGNOSIS — D61818 Other pancytopenia: Secondary | ICD-10-CM

## 2019-09-28 DIAGNOSIS — I8510 Secondary esophageal varices without bleeding: Secondary | ICD-10-CM | POA: Diagnosis not present

## 2019-09-28 DIAGNOSIS — K7460 Unspecified cirrhosis of liver: Secondary | ICD-10-CM | POA: Diagnosis not present

## 2019-09-28 DIAGNOSIS — G9340 Encephalopathy, unspecified: Secondary | ICD-10-CM | POA: Diagnosis not present

## 2019-09-28 DIAGNOSIS — K7210 Chronic hepatic failure without coma: Secondary | ICD-10-CM | POA: Diagnosis not present

## 2019-09-28 DIAGNOSIS — F202 Catatonic schizophrenia: Secondary | ICD-10-CM | POA: Diagnosis not present

## 2019-09-28 DIAGNOSIS — I2510 Atherosclerotic heart disease of native coronary artery without angina pectoris: Secondary | ICD-10-CM | POA: Diagnosis not present

## 2019-09-28 DIAGNOSIS — F061 Catatonic disorder due to known physiological condition: Secondary | ICD-10-CM | POA: Diagnosis not present

## 2019-09-28 DIAGNOSIS — R4182 Altered mental status, unspecified: Secondary | ICD-10-CM | POA: Diagnosis not present

## 2019-09-28 DIAGNOSIS — R188 Other ascites: Secondary | ICD-10-CM | POA: Diagnosis not present

## 2019-09-28 DIAGNOSIS — K7291 Hepatic failure, unspecified with coma: Secondary | ICD-10-CM | POA: Diagnosis not present

## 2019-09-28 DIAGNOSIS — I5032 Chronic diastolic (congestive) heart failure: Secondary | ICD-10-CM | POA: Diagnosis not present

## 2019-09-28 DIAGNOSIS — K743 Primary biliary cirrhosis: Secondary | ICD-10-CM | POA: Diagnosis not present

## 2019-09-28 DIAGNOSIS — K8309 Other cholangitis: Secondary | ICD-10-CM | POA: Diagnosis not present

## 2019-09-28 DIAGNOSIS — E871 Hypo-osmolality and hyponatremia: Secondary | ICD-10-CM | POA: Diagnosis not present

## 2019-09-28 HISTORY — DX: Other pancytopenia: D61.818

## 2019-09-29 DIAGNOSIS — G9340 Encephalopathy, unspecified: Secondary | ICD-10-CM | POA: Insufficient documentation

## 2019-09-29 HISTORY — DX: Encephalopathy, unspecified: G93.40

## 2019-10-01 MED ORDER — ZINC SULFATE 50 MG ZINC (220 MG) CAPSULE
ORAL_CAPSULE | Freq: Every day | ORAL | 11 refills | 30 days | Status: CP
Start: 2019-10-01 — End: 2020-09-30
  Filled 2019-10-01: qty 30, 30d supply, fill #0

## 2019-10-01 MED ORDER — LACTULOSE 10 GRAM/15 ML ORAL SOLUTION
Freq: Four times a day (QID) | ORAL | 0 refills | 30.00000 days | Status: CP
Start: 2019-10-01 — End: 2019-10-01

## 2019-10-01 MED ORDER — LACTULOSE 10 GRAM/15 ML ORAL SOLUTION: 20 g | mL | Freq: Four times a day (QID) | 0 refills | 30 days | Status: AC

## 2019-10-01 MED ORDER — URSODIOL 300 MG CAPSULE
ORAL_CAPSULE | Freq: Two times a day (BID) | ORAL | 0 refills | 30.00000 days | Status: CP
Start: 2019-10-01 — End: 2019-10-01

## 2019-10-01 MED ORDER — LORAZEPAM 1 MG TABLET
ORAL_TABLET | Freq: Every evening | ORAL | 0 refills | 5 days | Status: CP
Start: 2019-10-01 — End: 2019-10-06

## 2019-10-01 MED ORDER — URSODIOL 300 MG CAPSULE: 600 mg | capsule | Freq: Two times a day (BID) | 0 refills | 30 days | Status: AC

## 2019-10-01 MED ORDER — SPIRONOLACTONE 100 MG TABLET
ORAL_TABLET | Freq: Every day | ORAL | 0 refills | 15 days
Start: 2019-10-01 — End: 2019-10-31

## 2019-10-01 MED ORDER — ONDANSETRON 4 MG DISINTEGRATING TABLET
ORAL_TABLET | Freq: Two times a day (BID) | ORAL | 0 refills | 4 days | Status: CP | PRN
Start: 2019-10-01 — End: ?
  Filled 2019-10-01: qty 7, 4d supply, fill #0

## 2019-10-01 MED FILL — LORAZEPAM 0.5 MG TABLET: 5 days supply | Qty: 5 | Fill #0 | Status: AC

## 2019-10-01 MED FILL — LORAZEPAM 1 MG TABLET: 5 days supply | Qty: 5 | Fill #0 | Status: AC

## 2019-10-01 MED FILL — ONDANSETRON 4 MG DISINTEGRATING TABLET: 4 days supply | Qty: 7 | Fill #0 | Status: AC

## 2019-10-01 MED FILL — ZINC SULFATE 50 MG ZINC (220 MG) CAPSULE: 30 days supply | Qty: 30 | Fill #0 | Status: AC

## 2019-10-02 MED ORDER — LORAZEPAM 0.5 MG TABLET
ORAL_TABLET | Freq: Every day | ORAL | 0 refills | 5 days | Status: CP
Start: 2019-10-02 — End: 2019-10-07
  Filled 2019-10-01 (×2): qty 5, 5d supply, fill #0

## 2019-10-04 ENCOUNTER — Encounter (HOSPITAL_COMMUNITY)
Admission: RE | Admit: 2019-10-04 | Discharge: 2019-10-04 | Disposition: A | Discharge status: Home / Self Care | Payer: 59 | Source: Ambulatory Visit | Attending: Transplant Hepatology | Admitting: Transplant Hepatology

## 2019-10-04 ENCOUNTER — Other Ambulatory Visit: Payer: Self-pay | Admitting: *Deleted

## 2019-10-04 ENCOUNTER — Encounter: Payer: Self-pay | Admitting: *Deleted

## 2019-10-04 ENCOUNTER — Other Ambulatory Visit: Payer: Self-pay

## 2019-10-04 ENCOUNTER — Telehealth
Admit: 2019-10-04 | Discharge: 2019-10-05 | Payer: PRIVATE HEALTH INSURANCE | Attending: Psychiatry | Primary: Psychiatry

## 2019-10-04 DIAGNOSIS — F061 Catatonic disorder due to known physiological condition: Principal | ICD-10-CM

## 2019-10-04 DIAGNOSIS — G4709 Other insomnia: Principal | ICD-10-CM

## 2019-10-04 DIAGNOSIS — K7469 Other cirrhosis of liver: Secondary | ICD-10-CM | POA: Diagnosis not present

## 2019-10-04 LAB — COMPREHENSIVE METABOLIC PANEL
ALT: 52 U/L — ABNORMAL HIGH (ref 0–44)
AST: 76 U/L — ABNORMAL HIGH (ref 15–41)
Albumin: 3.5 g/dL (ref 3.5–5.0)
Alkaline Phosphatase: 156 U/L — ABNORMAL HIGH (ref 38–126)
Anion gap: 12 (ref 5–15)
BUN: 20 mg/dL (ref 6–20)
CO2: 19 mmol/L — ABNORMAL LOW (ref 22–32)
Calcium: 8.9 mg/dL (ref 8.9–10.3)
Chloride: 89 mmol/L — ABNORMAL LOW (ref 98–111)
Creatinine, Ser: 1 mg/dL (ref 0.44–1.00)
GFR calc Af Amer: >60 mL/min (ref >60)
GFR calc non Af Amer: >60 mL/min (ref >60)
Glucose, Bld: 119 mg/dL — ABNORMAL HIGH (ref 70–99)
Potassium: 4.4 mmol/L (ref 3.5–5.1)
Sodium: 120 mmol/L — ABNORMAL LOW (ref 135–145)
Total Bilirubin: 8.4 mg/dL — ABNORMAL HIGH (ref 0.3–1.2)
Total Protein: 6.7 g/dL (ref 6.5–8.1)

## 2019-10-04 LAB — PROTIME-INR
INR: 1.5 — ABNORMAL HIGH (ref 0.8–1.2)
Prothrombin Time: 17.2 seconds — ABNORMAL HIGH (ref 11.4–15.2)

## 2019-10-04 MED ORDER — KRISTALOSE 20 GRAM ORAL PACKET
PACK | Freq: Three times a day (TID) | ORAL | 11 refills | 30.00000 days | Status: CP
Start: 2019-10-04 — End: ?

## 2019-10-04 MED ORDER — FUROSEMIDE 10 MG/ML IJ SOLN: 40.0000 mg | INTRAMUSCULAR | Status: DC

## 2019-10-04 MED ORDER — ALBUMIN HUMAN 25 % IV SOLN
50.0000 g | INTRAVENOUS | Status: DC
  Administered 2019-10-04: 50 g via INTRAVENOUS
  Filled 2019-10-04: qty 200

## 2019-10-04 MED ORDER — FUROSEMIDE 10 MG/ML IJ SOLN
INTRAMUSCULAR | Status: AC
  Administered 2019-10-04: 40 mg via INTRAVENOUS
  Filled 2019-10-04: qty 4

## 2019-10-04 MED ORDER — HEPARIN SOD (PORK) LOCK FLUSH 100 UNIT/ML IV SOLN
INTRAVENOUS | Status: AC
  Administered 2019-10-04: 500 [IU]
  Filled 2019-10-04: qty 5

## 2019-10-04 MED ORDER — HEPARIN SOD (PORK) LOCK FLUSH 100 UNIT/ML IV SOLN: 500.0000 [IU] | INTRAVENOUS | Status: DC

## 2019-10-04 MED ORDER — HEPARIN SOD (PORK) LOCK FLUSH 100 UNIT/ML IV SOLN: 500.0000 [IU] | INTRAVENOUS | Status: DC | PRN

## 2019-10-04 NOTE — Patient Outreach (Signed)
[facility]) Care Management  10/04/2019  Christine Butler 05-19-1970 [id number]   Transition of care call/case closure   Referral received:10/04/19 Initial outreach:10/04/19 Insurance: UMR    Subjective: Initial successful telephone call to patient's preferred number in order to complete transition of care assessment; 2 HIPAA identifiers verified. Explained purpose of call and completed transition of care assessment.  Christine Butler reports that she is feeling better. She discussed sleeping better since being started on Ativan. She reports having telehealth visit with psychiatry that will be managing Ativan. She reports tolerating diet, denies nausea. She reports that she is not up to 3 bowel movements a day, she continues taking her lacutulose and other medications as prescribed. Reviewed symptoms of concern to follow up with MD sooner, fever, nausea vomiting,confusion, temperature elevation. Christine Butler discussed having her sister there for support and she has direct contact number to Transplant team.   Christine Butler  says she  does not need a referral to one of the [name] chronic disease management programs.  She states she does not have the hospital indemnity She uses a [facility] at [facility].  She denies educational needs related to staying safe during the COVID 19 pandemic.    Objective:  Christine Butler  was hospitalized at [facility] from 8/4-10/01/19 for Hepatic encephalopathy , hyponatremia. Comorbidities include: Endstage liver disease, Cirrhosis ,  Primary cholangitis, gastric varices, s/p banding , heart failure,CABG,  on liver transplant list , Agitated Catatonia  She  was discharged to home on 10/01/19 without the need for home health services or DME.   Assessment:  Patient voices good understanding of all discharge instructions.  See transition of care flowsheet for assessment details.Advised regarding PCP follow up after discharged she has declined at this time  will follow up with specialist in the next week. Advised regarding importance of .    Plan:  Reviewed hospital discharge diagnosis of  Hepatic encephalopathy    and discharge treatment plan using hospital discharge instructions, assessing medication adherence, reviewing problems requiring provider notification, and discussing the importance of follow up with surgeon, primary care provider and/or specialists as directed.  Reviewed [place] healthy lifestyle program information to receive discounted premium for  2022   Step 1: Get  your annual physical  Step 2: Complete your health assessment  Step 3:Identify your current health status and complete the corresponding action step between January 1, and October 26, 2019.     No ongoing care management needs identified so will close case to [place] Management services and route successful outreach letter with [place] Management pamphlet and 24 Hour Nurse Line Magnet to [place] Management clinical pool to be mailed to patient's home address.  Thanked patient for their services to [facility].  Christine Draft, RN, BSN  [place] Management Coordinator  [phone]- Mobile [phone]- Toll Free Main Office

## 2019-10-05 MED FILL — LORazepam 0.5 MG TABS: 0.5 | 30 days supply | Qty: 40 | Fill #0

## 2019-10-06 IMAGING — DX DG CHEST 2V
2 series · 2 of 2 positions shown · non-contrast
Comparison: Two-view chest x-ray 04/02/2017.

CLINICAL DATA: New onset left upper posterior chest pains. CABG
03/24/2017.

EXAM:
CHEST  2 VIEW

[dg chest 2 view (1 of 2)]
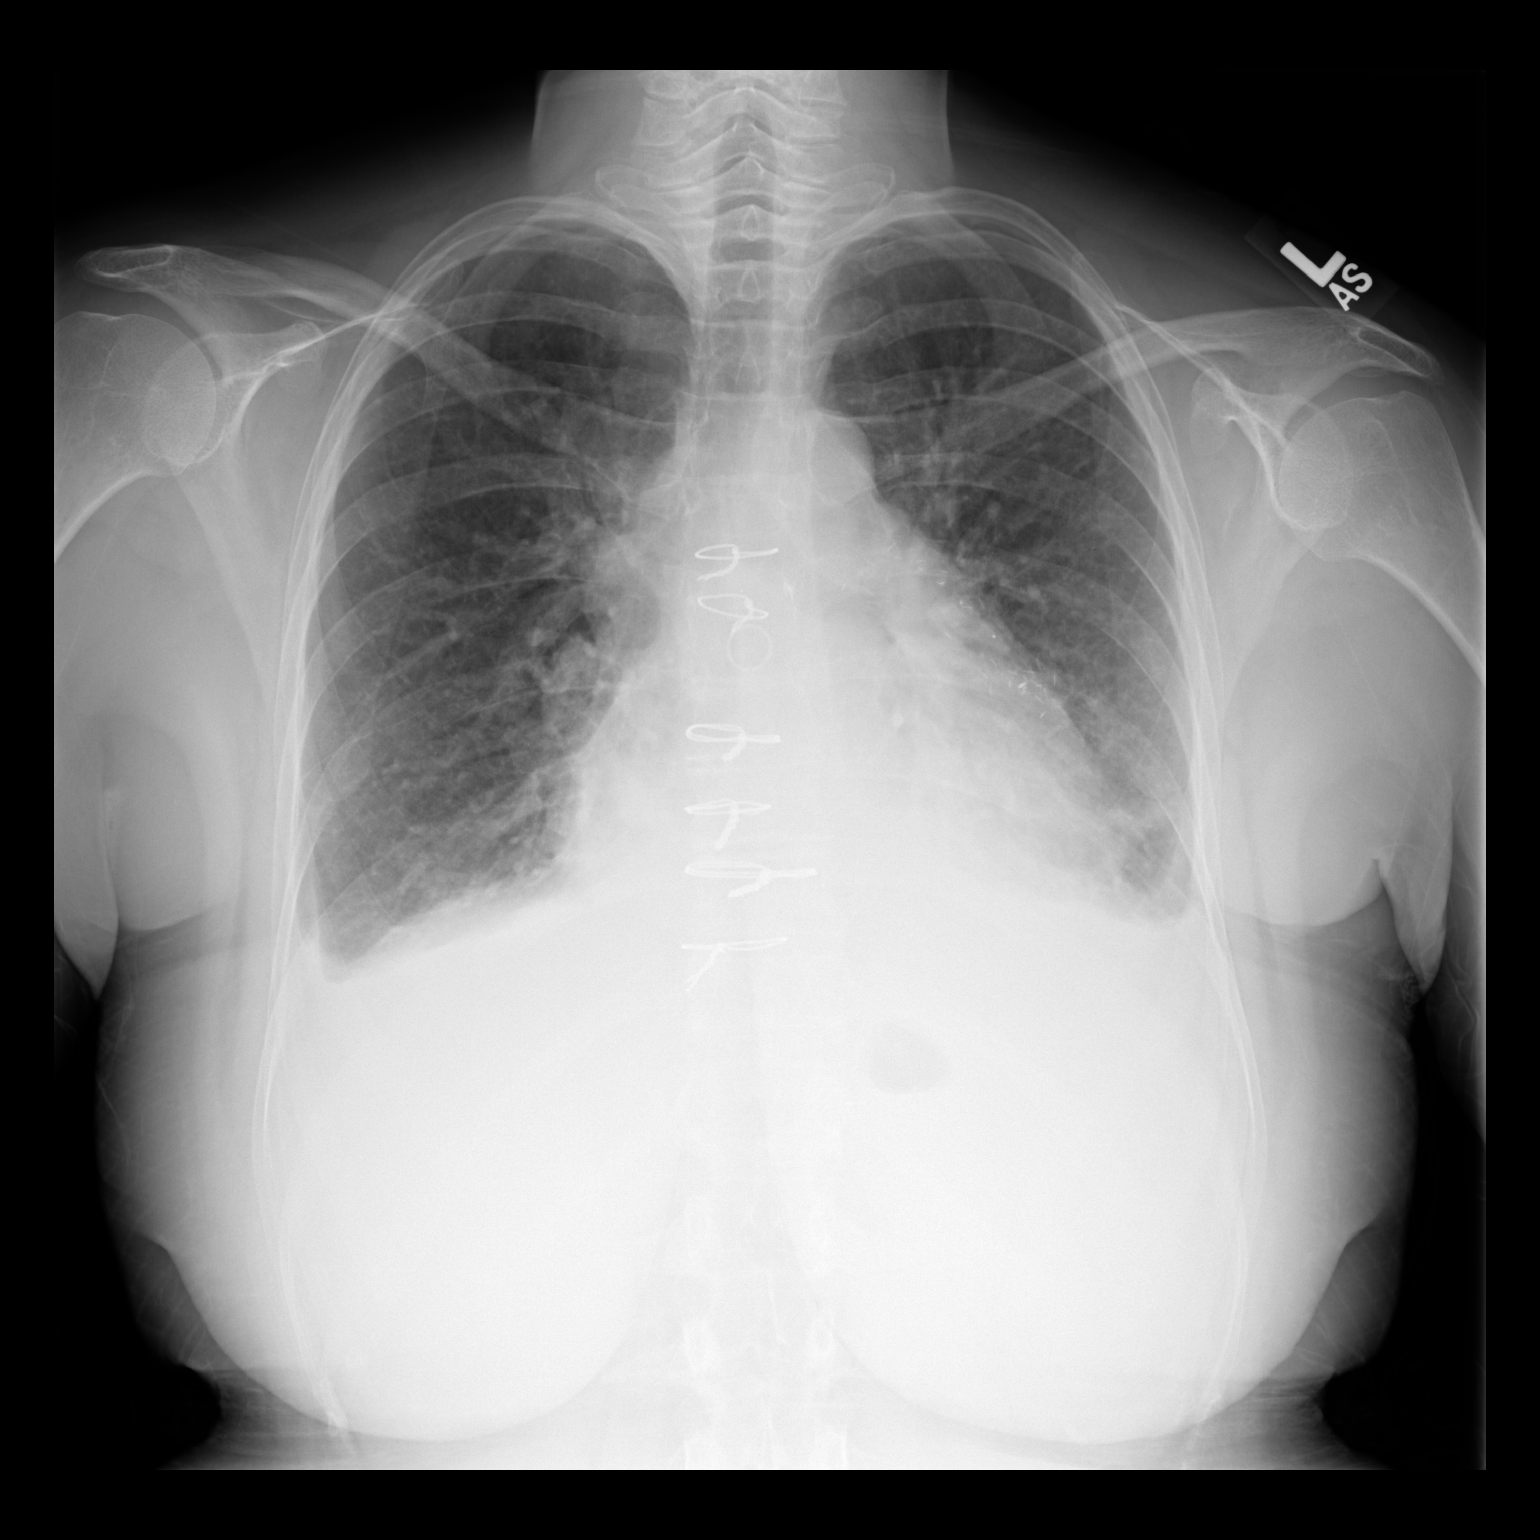

[dg chest 2 view (2 of 2)]
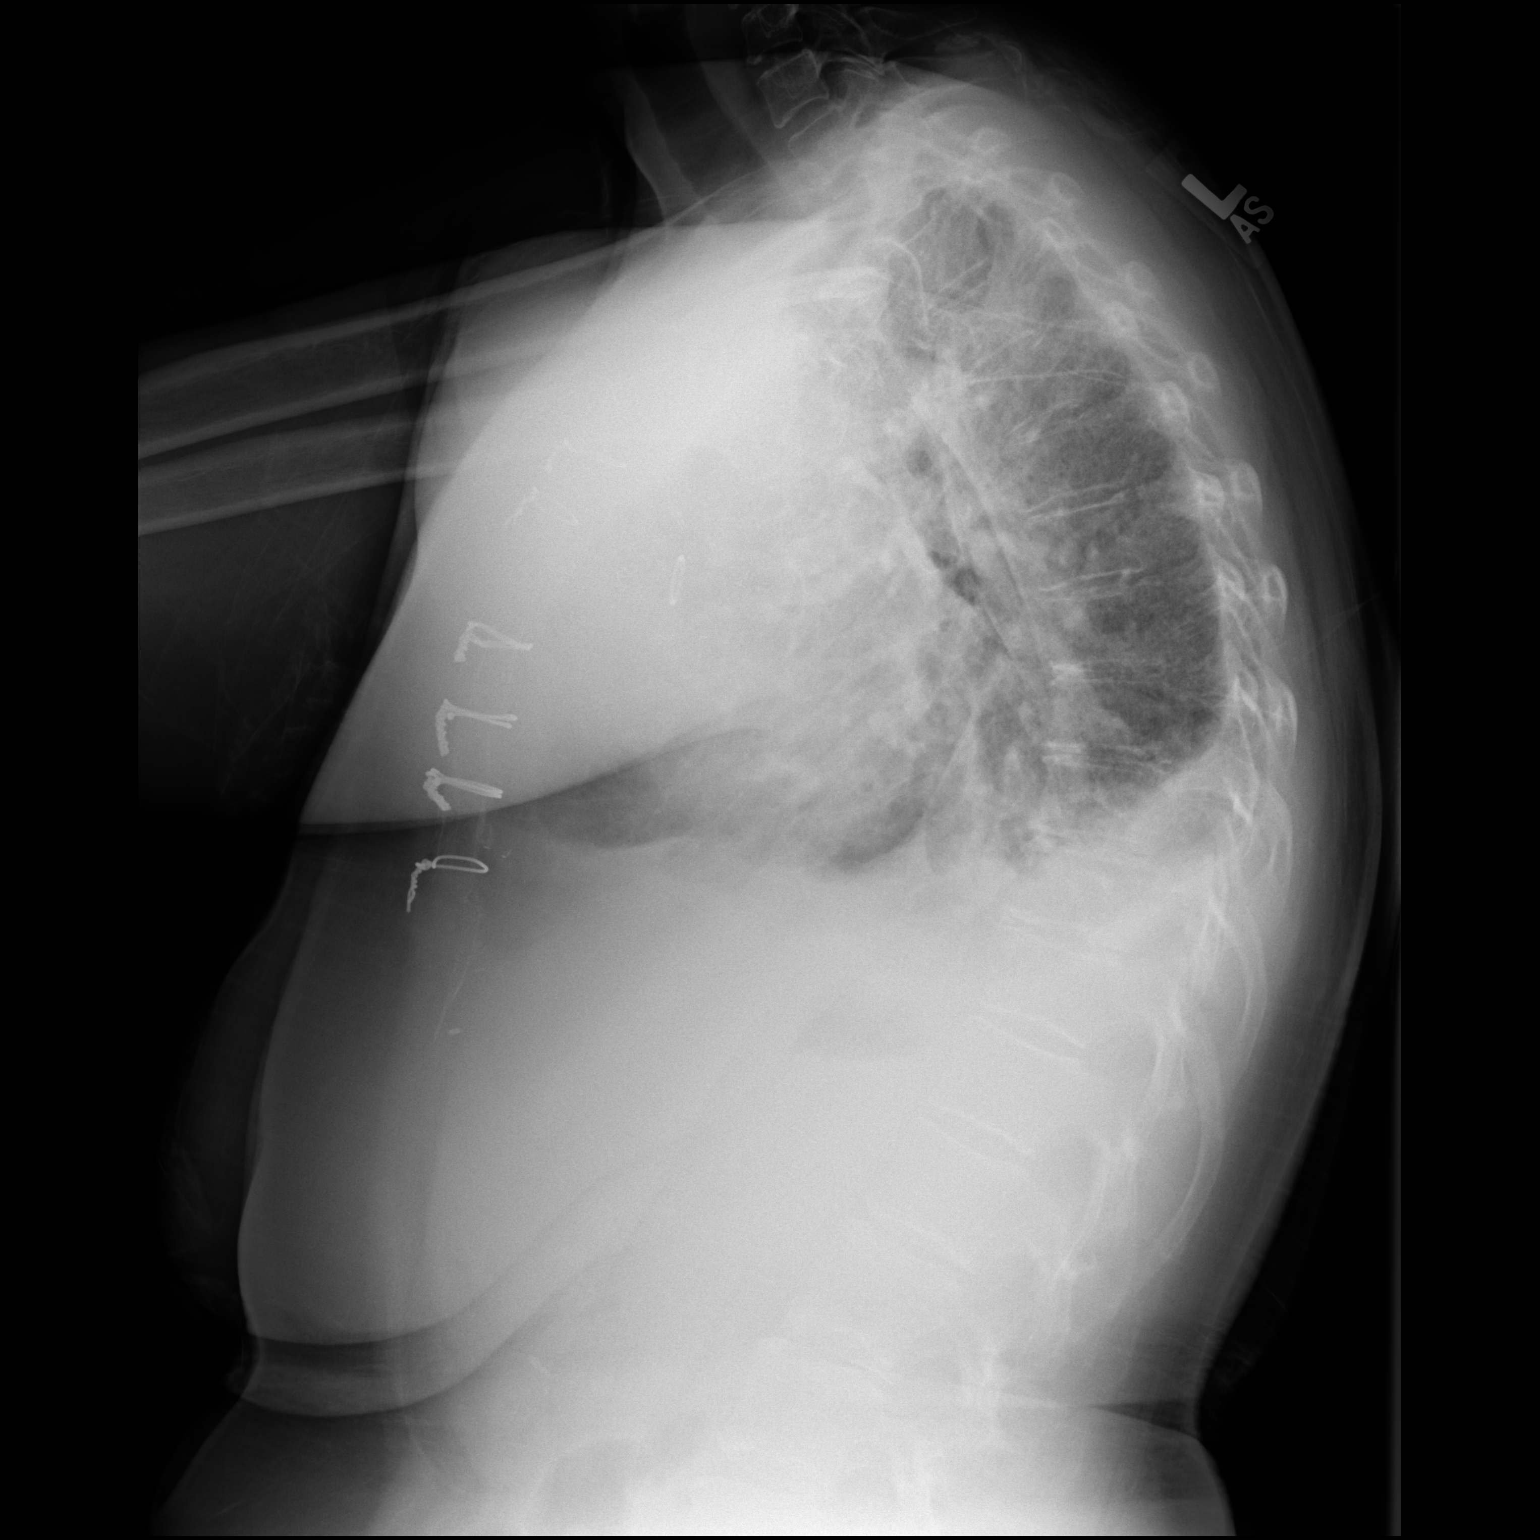

[2 of 2 positions shown; findings below may reference images not displayed]

FINDINGS: Patient is status post median sternotomy.  CABG is noted.

Heart size is enlarged. Interstitial edema and bilateral effusions
have increased since the prior exam. Bibasilar airspace disease
likely reflects atelectasis. The visualized soft tissues and bony
thorax are otherwise unremarkable.
IMPRESSION: 1. Cardiomegaly with increasing interstitial edema and effusions
compatible with congestive heart failure following CABG.
2. Bibasilar airspace disease likely reflects atelectasis.

## 2019-10-06 MED FILL — PANTOPRAZOLE SOD DR 40 MG T: 40 | 14 days supply | Qty: 14 | Fill #2

## 2019-10-07 MED ORDER — LORAZEPAM 0.5 MG TABLET
ORAL_TABLET | 0 refills | 0 days | Status: CP
Start: 2019-10-07 — End: ?

## 2019-10-11 ENCOUNTER — Encounter (HOSPITAL_COMMUNITY)
Admission: RE | Admit: 2019-10-11 | Discharge: 2019-10-11 | Disposition: A | Discharge status: Home / Self Care | Payer: 59 | Source: Ambulatory Visit | Attending: Transplant Hepatology | Admitting: Transplant Hepatology

## 2019-10-11 ENCOUNTER — Ambulatory Visit
Admit: 2019-10-11 | Discharge: 2019-10-16 | Disposition: A | Discharge status: Home / Self Care | Payer: PRIVATE HEALTH INSURANCE

## 2019-10-11 ENCOUNTER — Encounter
Admit: 2019-10-11 | Discharge: 2019-10-16 | Disposition: A | Discharge status: Home / Self Care | Payer: PRIVATE HEALTH INSURANCE

## 2019-10-11 DIAGNOSIS — K769 Liver disease, unspecified: Secondary | ICD-10-CM | POA: Diagnosis not present

## 2019-10-11 DIAGNOSIS — I5030 Unspecified diastolic (congestive) heart failure: Secondary | ICD-10-CM | POA: Diagnosis not present

## 2019-10-11 DIAGNOSIS — R6521 Severe sepsis with septic shock: Secondary | ICD-10-CM | POA: Diagnosis not present

## 2019-10-11 DIAGNOSIS — R188 Other ascites: Secondary | ICD-10-CM | POA: Diagnosis not present

## 2019-10-11 DIAGNOSIS — N179 Acute kidney failure, unspecified: Secondary | ICD-10-CM | POA: Diagnosis not present

## 2019-10-11 DIAGNOSIS — I361 Nonrheumatic tricuspid (valve) insufficiency: Secondary | ICD-10-CM | POA: Diagnosis not present

## 2019-10-11 DIAGNOSIS — K743 Primary biliary cirrhosis: Secondary | ICD-10-CM | POA: Diagnosis not present

## 2019-10-11 DIAGNOSIS — I517 Cardiomegaly: Secondary | ICD-10-CM | POA: Diagnosis not present

## 2019-10-11 DIAGNOSIS — A419 Sepsis, unspecified organism: Secondary | ICD-10-CM | POA: Diagnosis not present

## 2019-10-11 DIAGNOSIS — I959 Hypotension, unspecified: Secondary | ICD-10-CM | POA: Diagnosis not present

## 2019-10-11 DIAGNOSIS — K7460 Unspecified cirrhosis of liver: Secondary | ICD-10-CM | POA: Diagnosis not present

## 2019-10-11 DIAGNOSIS — I5032 Chronic diastolic (congestive) heart failure: Secondary | ICD-10-CM | POA: Diagnosis not present

## 2019-10-11 DIAGNOSIS — K7469 Other cirrhosis of liver: Secondary | ICD-10-CM | POA: Diagnosis not present

## 2019-10-11 DIAGNOSIS — N914 Secondary oligomenorrhea: Secondary | ICD-10-CM | POA: Diagnosis not present

## 2019-10-11 DIAGNOSIS — R110 Nausea: Secondary | ICD-10-CM | POA: Diagnosis not present

## 2019-10-11 DIAGNOSIS — R918 Other nonspecific abnormal finding of lung field: Secondary | ICD-10-CM | POA: Diagnosis not present

## 2019-10-11 DIAGNOSIS — E7849 Other hyperlipidemia: Secondary | ICD-10-CM | POA: Diagnosis not present

## 2019-10-11 DIAGNOSIS — R410 Disorientation, unspecified: Secondary | ICD-10-CM | POA: Diagnosis not present

## 2019-10-11 DIAGNOSIS — Z944 Liver transplant status: Secondary | ICD-10-CM | POA: Diagnosis not present

## 2019-10-11 DIAGNOSIS — E871 Hypo-osmolality and hyponatremia: Secondary | ICD-10-CM | POA: Diagnosis not present

## 2019-10-11 DIAGNOSIS — D696 Thrombocytopenia, unspecified: Secondary | ICD-10-CM | POA: Diagnosis not present

## 2019-10-11 DIAGNOSIS — R4182 Altered mental status, unspecified: Secondary | ICD-10-CM | POA: Diagnosis not present

## 2019-10-11 DIAGNOSIS — E785 Hyperlipidemia, unspecified: Secondary | ICD-10-CM | POA: Diagnosis not present

## 2019-10-11 DIAGNOSIS — K8309 Other cholangitis: Secondary | ICD-10-CM | POA: Diagnosis not present

## 2019-10-11 DIAGNOSIS — Z951 Presence of aortocoronary bypass graft: Secondary | ICD-10-CM | POA: Diagnosis not present

## 2019-10-11 DIAGNOSIS — K820 Obstruction of gallbladder: Secondary | ICD-10-CM | POA: Diagnosis not present

## 2019-10-11 DIAGNOSIS — R109 Unspecified abdominal pain: Secondary | ICD-10-CM | POA: Diagnosis not present

## 2019-10-11 DIAGNOSIS — R571 Hypovolemic shock: Secondary | ICD-10-CM | POA: Diagnosis not present

## 2019-10-11 DIAGNOSIS — K7290 Hepatic failure, unspecified without coma: Secondary | ICD-10-CM | POA: Diagnosis not present

## 2019-10-11 DIAGNOSIS — E873 Alkalosis: Secondary | ICD-10-CM | POA: Diagnosis not present

## 2019-10-11 DIAGNOSIS — R079 Chest pain, unspecified: Secondary | ICD-10-CM | POA: Diagnosis not present

## 2019-10-11 DIAGNOSIS — R1011 Right upper quadrant pain: Secondary | ICD-10-CM | POA: Diagnosis not present

## 2019-10-11 DIAGNOSIS — R5383 Other fatigue: Secondary | ICD-10-CM | POA: Diagnosis not present

## 2019-10-11 DIAGNOSIS — M818 Other osteoporosis without current pathological fracture: Secondary | ICD-10-CM | POA: Diagnosis not present

## 2019-10-11 LAB — CBC
HCT: 20 % — ABNORMAL LOW (ref 36.0–46.0)
Hemoglobin: 7.1 g/dL — ABNORMAL LOW (ref 12.0–15.0)
MCH: 32.4 pg (ref 26.0–34.0)
MCHC: 35.5 g/dL (ref 30.0–36.0)
MCV: 91.3 fL (ref 80.0–100.0)
Platelets: 129 10*3/uL — ABNORMAL LOW (ref 150–400)
RBC: 2.19 MIL/uL — ABNORMAL LOW (ref 3.87–5.11)
RDW: 16.8 % — ABNORMAL HIGH (ref 11.5–15.5)
WBC: 6.1 10*3/uL (ref 4.0–10.5)
nRBC: 0 % (ref 0.0–0.2)

## 2019-10-11 LAB — COMPREHENSIVE METABOLIC PANEL
ALT: 43 U/L (ref 0–44)
AST: 64 U/L — ABNORMAL HIGH (ref 15–41)
Albumin: 3.2 g/dL — ABNORMAL LOW (ref 3.5–5.0)
Alkaline Phosphatase: 117 U/L (ref 38–126)
Anion gap: 10 (ref 5–15)
BUN: 22 mg/dL — ABNORMAL HIGH (ref 6–20)
CO2: 19 mmol/L — ABNORMAL LOW (ref 22–32)
Calcium: 8.5 mg/dL — ABNORMAL LOW (ref 8.9–10.3)
Chloride: 84 mmol/L — ABNORMAL LOW (ref 98–111)
Creatinine, Ser: 1.24 mg/dL — ABNORMAL HIGH (ref 0.44–1.00)
GFR calc Af Amer: 59 mL/min — ABNORMAL LOW (ref >60)
GFR calc non Af Amer: 51 mL/min — ABNORMAL LOW (ref >60)
Glucose, Bld: 123 mg/dL — ABNORMAL HIGH (ref 70–99)
Potassium: 4.9 mmol/L (ref 3.5–5.1)
Sodium: 113 mmol/L — CL (ref 135–145)
Total Bilirubin: 9.2 mg/dL — ABNORMAL HIGH (ref 0.3–1.2)
Total Protein: 6.2 g/dL — ABNORMAL LOW (ref 6.5–8.1)

## 2019-10-11 LAB — PROTIME-INR
INR: 1.5 — ABNORMAL HIGH (ref 0.8–1.2)
Prothrombin Time: 17.8 seconds — ABNORMAL HIGH (ref 11.4–15.2)

## 2019-10-11 MED ORDER — ALBUMIN HUMAN 25 % IV SOLN
50.0000 g | INTRAVENOUS | Status: DC
  Administered 2019-10-11: 50 g via INTRAVENOUS
  Filled 2019-10-11: qty 200

## 2019-10-11 MED ORDER — HEPARIN SOD (PORK) LOCK FLUSH 100 UNIT/ML IV SOLN: 500.0000 [IU] | INTRAVENOUS | Status: DC | PRN

## 2019-10-11 MED ORDER — FUROSEMIDE 10 MG/ML IJ SOLN: 40.0000 mg | INTRAMUSCULAR | Status: DC

## 2019-10-11 MED ORDER — HEPARIN SOD (PORK) LOCK FLUSH 100 UNIT/ML IV SOLN: 500.0000 [IU] | INTRAVENOUS | Status: DC

## 2019-10-11 MED ORDER — FUROSEMIDE 10 MG/ML IJ SOLN
INTRAMUSCULAR | Status: AC
  Filled 2019-10-11: qty 4

## 2019-10-11 MED ORDER — HEPARIN SOD (PORK) LOCK FLUSH 100 UNIT/ML IV SOLN
INTRAVENOUS | Status: AC
  Administered 2019-10-11: 500 [IU]
  Filled 2019-10-11: qty 5

## 2019-10-11 NOTE — Progress Notes (Signed)
Patient reports "feeling dizzy, like I might pass out". Pulse 74, blood pressure 81/39. Head lowered, cool cloth applied to forehead. IV Albumin given via PAC as ordered. Called Neysa Bonito, RN, Liver Transplant Coordinator and informed of patient's complaints and questioned giving IV Lasix today. Per MD okay to hold IV Lasix after albumin, draw CBC which was done along with other ordered labs and will follow up with patient tomorrow. Patient and patient's sister verbalized understanding.

## 2019-10-11 NOTE — Progress Notes (Signed)
Pt's blood pressure slightly higher after IV albumin (88/39). Denies presyncopal feelings with position changes. Reports feeling better prior to DC home.

## 2019-10-12 LAB — LIPID PANEL
Chol/HDL Ratio: 9.1 ratio — ABNORMAL HIGH (ref 0.0–4.4)
Cholesterol, Total: 82 mg/dL — ABNORMAL LOW (ref 100–199)
HDL: 9 mg/dL — ABNORMAL LOW (ref >39)
LDL Chol Calc (NIH): 53 mg/dL (ref 0–99)
Triglycerides: 103 mg/dL (ref 0–149)
VLDL Cholesterol Cal: 20 mg/dL (ref 5–40)

## 2019-10-12 LAB — TSH: TSH: 2.36 u[IU]/mL (ref 0.450–4.500)

## 2019-10-12 LAB — FOLLICLE STIMULATING HORMONE: FSH: <0.3 m[IU]/mL

## 2019-10-12 LAB — T4, FREE: Free T4: 1.6 ng/dL (ref 0.82–1.77)

## 2019-10-13 ENCOUNTER — Ambulatory Visit: Payer: 59 | Admitting: Internal Medicine

## 2019-10-13 ENCOUNTER — Other Ambulatory Visit: Payer: Self-pay | Admitting: *Deleted

## 2019-10-13 DIAGNOSIS — E785 Hyperlipidemia, unspecified: Secondary | ICD-10-CM

## 2019-10-14 DIAGNOSIS — K743 Primary biliary cirrhosis: Principal | ICD-10-CM

## 2019-10-16 ENCOUNTER — Encounter
Admit: 2019-10-16 | Discharge: 2019-10-16 | Payer: PRIVATE HEALTH INSURANCE | Attending: Student in an Organized Health Care Education/Training Program | Primary: Student in an Organized Health Care Education/Training Program

## 2019-10-16 MED ORDER — PANTOPRAZOLE 40 MG TABLET,DELAYED RELEASE
ORAL_TABLET | Freq: Every day | ORAL | 2 refills | 14 days | Status: CP
Start: 2019-10-16 — End: 2019-10-30

## 2019-10-16 MED ORDER — MIDODRINE 5 MG TABLET
ORAL_TABLET | Freq: Three times a day (TID) | ORAL | 0 refills | 30.00000 days | Status: CP
Start: 2019-10-16 — End: 2019-11-15

## 2019-10-17 ENCOUNTER — Ambulatory Visit
Admit: 2019-10-17 | Discharge: 2019-11-11 | Disposition: A | Discharge status: Home / Self Care | Payer: PRIVATE HEALTH INSURANCE | Admitting: Surgery

## 2019-10-17 ENCOUNTER — Encounter
Admit: 2019-10-17 | Discharge: 2019-11-11 | Disposition: A | Discharge status: Home / Self Care | Payer: PRIVATE HEALTH INSURANCE | Attending: Student in an Organized Health Care Education/Training Program | Admitting: Surgery

## 2019-10-17 DIAGNOSIS — E872 Acidosis: Secondary | ICD-10-CM | POA: Diagnosis not present

## 2019-10-17 DIAGNOSIS — E7849 Other hyperlipidemia: Secondary | ICD-10-CM | POA: Diagnosis not present

## 2019-10-17 DIAGNOSIS — D72829 Elevated white blood cell count, unspecified: Secondary | ICD-10-CM | POA: Diagnosis not present

## 2019-10-17 DIAGNOSIS — J90 Pleural effusion, not elsewhere classified: Secondary | ICD-10-CM | POA: Diagnosis not present

## 2019-10-17 DIAGNOSIS — I517 Cardiomegaly: Secondary | ICD-10-CM | POA: Diagnosis not present

## 2019-10-17 DIAGNOSIS — R0603 Acute respiratory distress: Secondary | ICD-10-CM | POA: Diagnosis not present

## 2019-10-17 DIAGNOSIS — Z452 Encounter for adjustment and management of vascular access device: Secondary | ICD-10-CM | POA: Diagnosis not present

## 2019-10-17 DIAGNOSIS — R918 Other nonspecific abnormal finding of lung field: Secondary | ICD-10-CM | POA: Diagnosis not present

## 2019-10-17 DIAGNOSIS — F05 Delirium due to known physiological condition: Secondary | ICD-10-CM | POA: Diagnosis not present

## 2019-10-17 DIAGNOSIS — Z4682 Encounter for fitting and adjustment of non-vascular catheter: Secondary | ICD-10-CM | POA: Diagnosis not present

## 2019-10-17 DIAGNOSIS — Z79899 Other long term (current) drug therapy: Secondary | ICD-10-CM | POA: Diagnosis not present

## 2019-10-17 DIAGNOSIS — K7460 Unspecified cirrhosis of liver: Secondary | ICD-10-CM | POA: Diagnosis not present

## 2019-10-17 DIAGNOSIS — G479 Sleep disorder, unspecified: Secondary | ICD-10-CM | POA: Diagnosis not present

## 2019-10-17 DIAGNOSIS — R0789 Other chest pain: Secondary | ICD-10-CM | POA: Diagnosis not present

## 2019-10-17 DIAGNOSIS — I959 Hypotension, unspecified: Secondary | ICD-10-CM | POA: Diagnosis not present

## 2019-10-17 DIAGNOSIS — R410 Disorientation, unspecified: Secondary | ICD-10-CM | POA: Diagnosis not present

## 2019-10-17 DIAGNOSIS — R571 Hypovolemic shock: Secondary | ICD-10-CM | POA: Diagnosis not present

## 2019-10-17 DIAGNOSIS — R4182 Altered mental status, unspecified: Secondary | ICD-10-CM | POA: Diagnosis not present

## 2019-10-17 DIAGNOSIS — N179 Acute kidney failure, unspecified: Secondary | ICD-10-CM | POA: Diagnosis not present

## 2019-10-17 DIAGNOSIS — Z5181 Encounter for therapeutic drug level monitoring: Secondary | ICD-10-CM | POA: Diagnosis not present

## 2019-10-17 DIAGNOSIS — R638 Other symptoms and signs concerning food and fluid intake: Secondary | ICD-10-CM | POA: Diagnosis not present

## 2019-10-17 DIAGNOSIS — R109 Unspecified abdominal pain: Secondary | ICD-10-CM | POA: Diagnosis not present

## 2019-10-17 DIAGNOSIS — R52 Pain, unspecified: Secondary | ICD-10-CM | POA: Diagnosis not present

## 2019-10-17 DIAGNOSIS — Z951 Presence of aortocoronary bypass graft: Secondary | ICD-10-CM | POA: Diagnosis not present

## 2019-10-17 DIAGNOSIS — Z8719 Personal history of other diseases of the digestive system: Secondary | ICD-10-CM | POA: Diagnosis not present

## 2019-10-17 DIAGNOSIS — J9690 Respiratory failure, unspecified, unspecified whether with hypoxia or hypercapnia: Secondary | ICD-10-CM | POA: Diagnosis not present

## 2019-10-17 DIAGNOSIS — R6511 Systemic inflammatory response syndrome (SIRS) of non-infectious origin with acute organ dysfunction: Secondary | ICD-10-CM | POA: Diagnosis not present

## 2019-10-17 DIAGNOSIS — I2510 Atherosclerotic heart disease of native coronary artery without angina pectoris: Secondary | ICD-10-CM | POA: Diagnosis not present

## 2019-10-17 DIAGNOSIS — N170 Acute kidney failure with tubular necrosis: Secondary | ICD-10-CM | POA: Diagnosis not present

## 2019-10-17 DIAGNOSIS — R0600 Dyspnea, unspecified: Secondary | ICD-10-CM | POA: Diagnosis not present

## 2019-10-17 DIAGNOSIS — Z9911 Dependence on respirator [ventilator] status: Secondary | ICD-10-CM | POA: Diagnosis not present

## 2019-10-17 DIAGNOSIS — F061 Catatonic disorder due to known physiological condition: Secondary | ICD-10-CM | POA: Diagnosis not present

## 2019-10-17 DIAGNOSIS — F419 Anxiety disorder, unspecified: Secondary | ICD-10-CM | POA: Diagnosis not present

## 2019-10-17 DIAGNOSIS — E875 Hyperkalemia: Secondary | ICD-10-CM | POA: Diagnosis not present

## 2019-10-17 DIAGNOSIS — R509 Fever, unspecified: Secondary | ICD-10-CM | POA: Diagnosis not present

## 2019-10-17 DIAGNOSIS — I5030 Unspecified diastolic (congestive) heart failure: Secondary | ICD-10-CM | POA: Diagnosis not present

## 2019-10-17 DIAGNOSIS — Z978 Presence of other specified devices: Secondary | ICD-10-CM | POA: Diagnosis not present

## 2019-10-17 DIAGNOSIS — R0902 Hypoxemia: Secondary | ICD-10-CM | POA: Diagnosis not present

## 2019-10-17 DIAGNOSIS — K7290 Hepatic failure, unspecified without coma: Secondary | ICD-10-CM | POA: Diagnosis not present

## 2019-10-17 DIAGNOSIS — R609 Edema, unspecified: Secondary | ICD-10-CM | POA: Diagnosis not present

## 2019-10-17 DIAGNOSIS — J189 Pneumonia, unspecified organism: Secondary | ICD-10-CM | POA: Diagnosis not present

## 2019-10-17 DIAGNOSIS — R0989 Other specified symptoms and signs involving the circulatory and respiratory systems: Secondary | ICD-10-CM | POA: Diagnosis not present

## 2019-10-17 DIAGNOSIS — R451 Restlessness and agitation: Secondary | ICD-10-CM | POA: Diagnosis not present

## 2019-10-17 DIAGNOSIS — F202 Catatonic schizophrenia: Secondary | ICD-10-CM | POA: Diagnosis not present

## 2019-10-17 DIAGNOSIS — K745 Biliary cirrhosis, unspecified: Secondary | ICD-10-CM | POA: Diagnosis not present

## 2019-10-17 DIAGNOSIS — K766 Portal hypertension: Secondary | ICD-10-CM | POA: Diagnosis not present

## 2019-10-17 DIAGNOSIS — K819 Cholecystitis, unspecified: Secondary | ICD-10-CM | POA: Diagnosis not present

## 2019-10-17 DIAGNOSIS — Z944 Liver transplant status: Secondary | ICD-10-CM | POA: Diagnosis not present

## 2019-10-17 DIAGNOSIS — J9601 Acute respiratory failure with hypoxia: Secondary | ICD-10-CM | POA: Diagnosis not present

## 2019-10-17 DIAGNOSIS — M6281 Muscle weakness (generalized): Secondary | ICD-10-CM | POA: Diagnosis not present

## 2019-10-17 DIAGNOSIS — K567 Ileus, unspecified: Secondary | ICD-10-CM | POA: Diagnosis not present

## 2019-10-17 DIAGNOSIS — J9811 Atelectasis: Secondary | ICD-10-CM | POA: Diagnosis not present

## 2019-10-17 DIAGNOSIS — K8301 Primary sclerosing cholangitis: Secondary | ICD-10-CM | POA: Diagnosis not present

## 2019-10-17 DIAGNOSIS — K8309 Other cholangitis: Secondary | ICD-10-CM | POA: Diagnosis not present

## 2019-10-17 DIAGNOSIS — R0689 Other abnormalities of breathing: Secondary | ICD-10-CM | POA: Diagnosis not present

## 2019-10-17 DIAGNOSIS — R401 Stupor: Secondary | ICD-10-CM | POA: Diagnosis not present

## 2019-10-17 DIAGNOSIS — N990 Postprocedural (acute) (chronic) kidney failure: Secondary | ICD-10-CM | POA: Diagnosis not present

## 2019-10-17 DIAGNOSIS — E871 Hypo-osmolality and hyponatremia: Secondary | ICD-10-CM | POA: Diagnosis not present

## 2019-10-17 DIAGNOSIS — Z8659 Personal history of other mental and behavioral disorders: Secondary | ICD-10-CM | POA: Diagnosis not present

## 2019-10-17 MED FILL — MIDODRINE HCL 5 MG TABS: 5 | 30 days supply | Qty: 270 | Fill #0

## 2019-10-17 MED FILL — PANTOPRAZOLE SOD DR 40 MG T: 40 | 14 days supply | Qty: 14 | Fill #0

## 2019-10-18 ENCOUNTER — Inpatient Hospital Stay (HOSPITAL_COMMUNITY): Admission: RE | Admit: 2019-10-18 | Payer: 59 | Source: Ambulatory Visit

## 2019-10-18 DIAGNOSIS — Z944 Liver transplant status: Principal | ICD-10-CM

## 2019-10-18 MED ORDER — TACROLIMUS 1 MG CAPSULE, IMMEDIATE-RELEASE
ORAL_CAPSULE | Freq: Two times a day (BID) | ORAL | 11 refills | 30.00000 days | Status: CP
Start: 2019-10-18 — End: 2020-10-17

## 2019-10-18 MED ORDER — VALGANCICLOVIR 450 MG TABLET
ORAL_TABLET | Freq: Every day | ORAL | 2 refills | 30.00000 days | Status: CP
Start: 2019-10-18 — End: 2020-01-16
  Filled 2019-11-11: qty 30, 30d supply, fill #0

## 2019-10-18 MED ORDER — MYCOPHENOLATE SODIUM 180 MG TABLET,DELAYED RELEASE
ORAL_TABLET | Freq: Two times a day (BID) | ORAL | 11 refills | 30.00000 days | Status: CP
Start: 2019-10-18 — End: 2020-10-17

## 2019-10-19 ENCOUNTER — Other Ambulatory Visit: Payer: Self-pay | Admitting: *Deleted

## 2019-10-19 DIAGNOSIS — Z79899 Other long term (current) drug therapy: Principal | ICD-10-CM

## 2019-10-19 DIAGNOSIS — Z944 Liver transplant status: Principal | ICD-10-CM

## 2019-10-19 DIAGNOSIS — Z7952 Long term (current) use of systemic steroids: Principal | ICD-10-CM

## 2019-10-19 MED ORDER — TACROLIMUS 1 MG CAPSULE, IMMEDIATE-RELEASE
ORAL_CAPSULE | Freq: Two times a day (BID) | ORAL | 11 refills | 30.00000 days | Status: CP
Start: 2019-10-19 — End: 2020-10-18

## 2019-10-19 MED FILL — TACROLIMUS 1 MG CAPSULE: 1 | 30 days supply | Qty: 300 | Fill #0

## 2019-10-19 NOTE — Patient Outreach (Signed)
[facility]) Care Management  10/19/2019  Christine Butler January 23, 1971 [id number]   Transition of care   Referral received:10/19/19 Initial outreach:10/19/19 Insurance: [place] referral for transition of care outreach , in [place] was admitted to [facility] 8/17-8/22 for Hyponatremia, hypotension, dizziness. PMHX: includes : Cirrhosis secondary to Primary biliary cholangitis, CAD, CABG (02/2017), HFpEF,   She was discharged to home on 8/22.   Per Electronic record review on 10/17/19 , care everywhere patient was readmitted on 10/17/19 at [facility]  for Liver replaced by transplant.   Plan Will follow progress for discharge disposition and transition of care follow up.   [name], RN, BSN  [place] Management Coordinator  [phone]- Mobile [phone]- Toll Free Main Office

## 2019-10-25 ENCOUNTER — Ambulatory Visit (HOSPITAL_COMMUNITY): Payer: 59

## 2019-11-02 DIAGNOSIS — R401 Stupor: Secondary | ICD-10-CM | POA: Diagnosis not present

## 2019-11-02 DIAGNOSIS — Z944 Liver transplant status: Secondary | ICD-10-CM | POA: Diagnosis not present

## 2019-11-02 DIAGNOSIS — Z8659 Personal history of other mental and behavioral disorders: Secondary | ICD-10-CM | POA: Diagnosis not present

## 2019-11-09 ENCOUNTER — Other Ambulatory Visit (HOSPITAL_COMMUNITY): Payer: Self-pay

## 2019-11-09 MED ORDER — MULTIVITAMIN-IRON 9 MG-FOLIC ACID 400 MCG-CALCIUM AND MINERALS TABLET
ORAL_TABLET | Freq: Every day | ORAL | 11 refills | 130.00000 days | Status: CP
Start: 2019-11-09 — End: 2020-11-08
  Filled 2019-11-11: qty 30, 30d supply, fill #0

## 2019-11-09 MED ORDER — TACROLIMUS 1 MG CAPSULE, IMMEDIATE-RELEASE
ORAL_CAPSULE | Freq: Two times a day (BID) | ORAL | 11 refills | 30 days | Status: CP
Start: 2019-11-09 — End: 2020-11-08

## 2019-11-09 MED ORDER — CHOLECALCIFEROL (VITAMIN D3) 10 MCG (400 UNIT) TABLET
ORAL_TABLET | Freq: Every day | GASTROENTERAL | 11 refills | 100 days | Status: CP
Start: 2019-11-09 — End: 2020-11-08

## 2019-11-09 MED ORDER — OLANZAPINE 5 MG TABLET
ORAL_TABLET | Freq: Every evening | ORAL | 11 refills | 30.00000 days | Status: CP
Start: 2019-11-09 — End: 2020-11-08
  Filled 2019-11-11: qty 30, 30d supply, fill #0

## 2019-11-09 MED ORDER — ACETAMINOPHEN 325 MG TABLET
ORAL_TABLET | Freq: Four times a day (QID) | ORAL | 0 refills | 13.00000 days | Status: CP | PRN
Start: 2019-11-09 — End: ?
  Filled 2019-11-11: qty 100, 12d supply, fill #0

## 2019-11-09 MED ORDER — LORAZEPAM 1 MG TABLET
ORAL_TABLET | Freq: Every evening | ORAL | 0 refills | 15.00000 days | Status: CP
Start: 2019-11-09 — End: 2019-11-09

## 2019-11-09 MED ORDER — POLYETHYLENE GLYCOL 3350 17 GRAM/DOSE ORAL POWDER
Freq: Every day | ORAL | 2 refills | 14 days | Status: CP | PRN
Start: 2019-11-09 — End: ?
  Filled 2019-11-11: qty 238, 14d supply, fill #0

## 2019-11-09 MED ORDER — THIAMINE HCL (VITAMIN B1) 100 MG TABLET
ORAL_TABLET | Freq: Every day | ORAL | 11 refills | 110 days | Status: CP
Start: 2019-11-09 — End: 2020-11-08
  Filled 2019-11-11: qty 110, 110d supply, fill #0

## 2019-11-09 MED ORDER — OXYCODONE 5 MG TABLET
ORAL_TABLET | Freq: Four times a day (QID) | ORAL | 0 refills | 5.00000 days | Status: CP | PRN
Start: 2019-11-09 — End: 2019-11-09
  Filled 2019-11-11: qty 40, 5d supply, fill #0

## 2019-11-09 MED ORDER — LORAZEPAM 1 MG TABLET: 1 mg | tablet | Freq: Every evening | 0 refills | 30 days | Status: AC

## 2019-11-09 MED ORDER — ONDANSETRON 4 MG DISINTEGRATING TABLET
ORAL_TABLET | Freq: Two times a day (BID) | ORAL | 0 refills | 4 days | Status: CP | PRN
Start: 2019-11-09 — End: ?
  Filled 2019-11-11: qty 7, 4d supply, fill #0

## 2019-11-09 MED ORDER — CARVEDILOL 6.25 MG TABLET
ORAL_TABLET | Freq: Two times a day (BID) | ORAL | 11 refills | 30.00000 days | Status: CP
Start: 2019-11-09 — End: 2020-11-08

## 2019-11-09 MED ORDER — DOCUSATE SODIUM 100 MG CAPSULE
ORAL_CAPSULE | Freq: Two times a day (BID) | ORAL | 2 refills | 15 days | Status: CP | PRN
Start: 2019-11-09 — End: 2020-11-08
  Filled 2019-11-11: qty 30, 15d supply, fill #0

## 2019-11-09 MED ORDER — OXYCODONE 5 MG TABLET: tablet | Freq: Four times a day (QID) | 0 refills | 5 days | Status: AC

## 2019-11-09 MED ORDER — PREDNISONE 5 MG TABLET
ORAL_TABLET | Freq: Every day | ORAL | 11 refills | 30.00000 days | Status: CP
Start: 2019-11-09 — End: 2020-11-08
  Filled 2019-11-11: qty 30, 30d supply, fill #0

## 2019-11-09 MED ORDER — CALCIUM CARBONATE 200 MG CALCIUM (500 MG) CHEWABLE TABLET
ORAL_TABLET | Freq: Three times a day (TID) | ORAL | 11 refills | 50.00000 days | Status: CP | PRN
Start: 2019-11-09 — End: 2019-11-09

## 2019-11-09 MED ORDER — MG-PLUS-PROTEIN 133 MG TABLET
ORAL_TABLET | Freq: Two times a day (BID) | ORAL | 11 refills | 50.00000 days | Status: CP
Start: 2019-11-09 — End: ?
  Filled 2019-11-11: qty 100, 50d supply, fill #0

## 2019-11-09 MED ORDER — PENTAMIDINE 50 MG/ML INHALATION SOLUTION
RESPIRATORY_TRACT | 5 refills | 28 days
Start: 2019-11-09 — End: 2020-05-07

## 2019-11-09 MED ORDER — FOLIC ACID 1 MG TABLET
ORAL_TABLET | Freq: Every day | ORAL | 11 refills | 30 days | Status: CP
Start: 2019-11-09 — End: 2020-11-08

## 2019-11-09 MED ORDER — OLANZAPINE 10 MG TABLET
ORAL_TABLET | Freq: Every evening | ORAL | 11 refills | 30.00000 days | Status: CP
Start: 2019-11-09 — End: 2019-11-09

## 2019-11-09 MED ORDER — OMEPRAZOLE 20 MG CAPSULE,DELAYED RELEASE
ORAL_CAPSULE | Freq: Every day | ORAL | 11 refills | 30 days | Status: CP
Start: 2019-11-09 — End: 2020-11-08
  Filled 2019-11-11: qty 30, 30d supply, fill #0

## 2019-11-11 MED ORDER — LOPERAMIDE 2 MG CAPSULE
ORAL_CAPSULE | Freq: Three times a day (TID) | ORAL | 0 refills | 5.00000 days | Status: CP | PRN
Start: 2019-11-11 — End: ?
  Filled 2019-11-11: qty 30, 5d supply, fill #0

## 2019-11-11 MED ORDER — LORAZEPAM 0.5 MG TABLET
ORAL_TABLET | Freq: Every evening | ORAL | 0 refills | 7 days | Status: CP
Start: 2019-11-11 — End: 2019-11-18
  Filled 2019-11-11: qty 7, 7d supply, fill #0

## 2019-11-11 MED ORDER — CARVEDILOL 12.5 MG TABLET
ORAL_TABLET | Freq: Two times a day (BID) | ORAL | 11 refills | 30 days | Status: CP
Start: 2019-11-11 — End: 2020-11-10
  Filled 2019-11-11: qty 60, 30d supply, fill #0

## 2019-11-11 MED ORDER — URSODIOL 300 MG CAPSULE
ORAL_CAPSULE | Freq: Three times a day (TID) | ORAL | 11 refills | 30.00000 days | Status: CP
Start: 2019-11-11 — End: 2020-11-10

## 2019-11-11 MED ORDER — TACROLIMUS 1 MG CAPSULE, IMMEDIATE-RELEASE
ORAL_CAPSULE | Freq: Two times a day (BID) | ORAL | 11 refills | 30 days
Start: 2019-11-11 — End: 2020-11-10

## 2019-11-11 MED ORDER — LORAZEPAM 1 MG TABLET
ORAL_TABLET | Freq: Every evening | ORAL | 0 refills | 7 days | Status: CP
Start: 2019-11-11 — End: 2019-11-11

## 2019-11-11 MED ORDER — MYCOPHENOLATE SODIUM 180 MG TABLET,DELAYED RELEASE
ORAL_TABLET | Freq: Three times a day (TID) | ORAL | 11 refills | 30 days | Status: CP
Start: 2019-11-11 — End: 2020-11-10
  Filled 2019-11-11: qty 90, 30d supply, fill #0

## 2019-11-11 MED FILL — VITAMIN B-1 100 MG TABLET: 110 days supply | Qty: 110 | Fill #0 | Status: AC

## 2019-11-11 MED FILL — FOLIC ACID 1 MG TABLET: 30 days supply | Qty: 30 | Fill #0 | Status: AC

## 2019-11-11 MED FILL — PREDNISONE 5 MG TABLET: 30 days supply | Qty: 30 | Fill #0 | Status: AC

## 2019-11-11 MED FILL — LORAZEPAM 0.5 MG TABLET: 7 days supply | Qty: 7 | Fill #0 | Status: AC

## 2019-11-11 MED FILL — ACETAMINOPHEN 325 MG TABLET: 12 days supply | Qty: 100 | Fill #0 | Status: AC

## 2019-11-11 MED FILL — MG-PLUS-PROTEIN 133 MG TABLET: 50 days supply | Qty: 100 | Fill #0 | Status: AC

## 2019-11-11 MED FILL — POLYETHYLENE GLYCOL 3350 17 GRAM/DOSE ORAL POWDER: 14 days supply | Qty: 238 | Fill #0 | Status: AC

## 2019-11-11 MED FILL — LOPERAMIDE 2 MG CAPSULE: 5 days supply | Qty: 30 | Fill #0 | Status: AC

## 2019-11-11 MED FILL — MYCOPHENOLATE SODIUM 180 MG TABLET,DELAYED RELEASE: 30 days supply | Qty: 90 | Fill #0 | Status: AC

## 2019-11-11 MED FILL — OXYCODONE 5 MG TABLET: 5 days supply | Qty: 40 | Fill #0 | Status: AC

## 2019-11-11 MED FILL — OMEPRAZOLE 20 MG CAPSULE,DELAYED RELEASE: 30 days supply | Qty: 30 | Fill #0 | Status: AC

## 2019-11-11 MED FILL — ONDANSETRON 4 MG DISINTEGRATING TABLET: 4 days supply | Qty: 7 | Fill #0 | Status: AC

## 2019-11-11 MED FILL — VALGANCICLOVIR 450 MG TABLET: 30 days supply | Qty: 30 | Fill #0 | Status: AC

## 2019-11-11 MED FILL — CARVEDILOL 12.5 MG TABLET: 30 days supply | Qty: 60 | Fill #0 | Status: AC

## 2019-11-11 MED FILL — DOCUSATE SODIUM 100 MG CAPSULE: 15 days supply | Qty: 30 | Fill #0 | Status: AC

## 2019-11-11 MED FILL — OLANZAPINE 5 MG TABLET: 30 days supply | Qty: 30 | Fill #0 | Status: AC

## 2019-11-14 ENCOUNTER — Encounter: Payer: Self-pay | Admitting: *Deleted

## 2019-11-14 ENCOUNTER — Other Ambulatory Visit: Payer: Self-pay | Admitting: *Deleted

## 2019-11-14 DIAGNOSIS — Z944 Liver transplant status: Principal | ICD-10-CM

## 2019-11-14 DIAGNOSIS — Z1159 Encounter for screening for other viral diseases: Principal | ICD-10-CM

## 2019-11-14 DIAGNOSIS — Z7721 Contact with and (suspected) exposure to potentially hazardous body fluids: Principal | ICD-10-CM

## 2019-11-14 DIAGNOSIS — Z79899 Other long term (current) drug therapy: Principal | ICD-10-CM

## 2019-11-14 DIAGNOSIS — Z130 Encounter for screening for diseases of the blood and blood-forming organs and certain disorders involving the immune mechanism: Principal | ICD-10-CM

## 2019-11-14 DIAGNOSIS — Z7289 Other problems related to lifestyle: Principal | ICD-10-CM

## 2019-11-14 DIAGNOSIS — Z114 Encounter for screening for human immunodeficiency virus [HIV]: Principal | ICD-10-CM

## 2019-11-14 NOTE — Patient Outreach (Signed)
[name]) Care Management  11/14/2019  Christine Butler 1971/01/07 [id number]   Transition of care call  Referral received:10/19/19 Initial outreach:11/11/19  Insurance: [place] UMR    Subjective: Initial successful telephone call to patient's preferred number in order to complete transition of care assessment; 2 HIPAA identifiers verified. Explained purpose of call and completed transition of care assessment.  Christine Butler states that she is doing better, she feels like the light bulb is back on, she discussed being so grateful for liver transplant as she was just getting sicker.She reports improvement in skin color and  being so surprised to see her eyes or white now. She report improved mental clarity. She is working on getting her strength back, taking is slow and steady very pleased with progress she is making. She report abdominal incision area with staples in place without redness , states surgical pain well managed with prescribed medications. She is working on improved food/drink intake taking is slow and steady,  reinforced eating small meals through out day, reinforced importance of recommended daily fluid intake. She discussed episode of nausea on yesterday, vomiting after taking medication, reports getting relief after medication for nausea medication. Patient states that her sister is helping with organizing her medications. She  denies bowel or bladder problems. Patient sister and parents  are assisting with her  Recovery she states that her home has been setup, she is currently set up downstairs for her bedroom,and they have plans to assist her upstairs to take a shower on tomorrow. Patient is using a rollator for mobility.  Patient discussed that they were not able to get home health setup for health as no home health agency available  , she is currently going to [facility] for labs three days a week, and they are unable to use her portacath and states that she has bad  veins she reports no problem with getting labs done on today. Patient has discussed they were unable to get home health setup for her , states she is unsure where she will go for therapy, states she has gone for outpatient therapy [place] in the past after a surgery. Patient discussed speaking to  her transplant coordinator Pincus Large on today.   She does not have the hospital indemnity She  uses a [facility] pharmacy at [facility].   Discussed  denies education  related to staying safe during the COVID 19 pandemic.    Objective:  Shabreka Coulon  was hospitalized at [facility]  from 8/23 - 11/11/19 Liver Transplant  Comorbidities include: Endstage liver disease , cirrhosis, primary cholangitis, gastric varices s/p banding, heart failure, cabg, agitated Catatonia.  She  was discharged to home on 9/17/2 outpatient lab work at [facility], and outpatient physical therapy to be arranged, patient was provided rollator at discharge    Assessment:  Patient voices good understanding of all discharge instructions.  See transition of care flowsheet for assessment details.   Plan:  Reviewed hospital discharge diagnosis of Liver transplant   and discharge treatment plan using hospital discharge instructions, assessing medication adherence, reviewing problems requiring provider notification, and discussing the importance of follow up with surgeon, primary care provider and/or specialists as directed.  Reviewed [place] healthy lifestyle program information to receive discounted premium for  2022   Step 1: Get  your annual physical  Step 2: Complete your health assessment  Step 3:Identify your current health status and complete the corresponding action step between January 1, and October 26, 2019.    Placed call to patient transplant coordinator Pincus Large , contact number provided by patient . Spoke with [name] to clarify patient concerns regarding outpatient physical therapy , whether referral has been  sent and discussed patient concern with going outpatient for labs. [name] will follow up with  inpatient social worker regarding discharge plans not including home health .  Will plan follow up call to patient in the next 4 business or sooner if update from Transplant Coordinator or inpatient LCSW.   Routed successful outreach letter with [place] Management pamphlet and 24 Hour Nurse Line Magnet to [place] Management clinical pool to be mailed to patient's home address.    [name], RN, BSN  [place] Management Coordinator  [phone]- Mobile [phone]- Toll Free Main Office

## 2019-11-15 DIAGNOSIS — Z944 Liver transplant status: Principal | ICD-10-CM

## 2019-11-15 DIAGNOSIS — Z79899 Other long term (current) drug therapy: Principal | ICD-10-CM

## 2019-11-16 DIAGNOSIS — Z944 Liver transplant status: Principal | ICD-10-CM

## 2019-11-16 DIAGNOSIS — R5381 Other malaise: Principal | ICD-10-CM

## 2019-11-16 DIAGNOSIS — Z79899 Other long term (current) drug therapy: Secondary | ICD-10-CM | POA: Diagnosis not present

## 2019-11-17 ENCOUNTER — Other Ambulatory Visit: Payer: Self-pay | Admitting: *Deleted

## 2019-11-17 DIAGNOSIS — Z944 Liver transplant status: Principal | ICD-10-CM

## 2019-11-17 DIAGNOSIS — Z79899 Other long term (current) drug therapy: Principal | ICD-10-CM

## 2019-11-17 DIAGNOSIS — M41123 Adolescent idiopathic scoliosis, cervicothoracic region: Secondary | ICD-10-CM | POA: Diagnosis not present

## 2019-11-17 DIAGNOSIS — M9903 Segmental and somatic dysfunction of lumbar region: Secondary | ICD-10-CM | POA: Diagnosis not present

## 2019-11-17 DIAGNOSIS — M9901 Segmental and somatic dysfunction of cervical region: Secondary | ICD-10-CM | POA: Diagnosis not present

## 2019-11-17 DIAGNOSIS — M9905 Segmental and somatic dysfunction of pelvic region: Secondary | ICD-10-CM | POA: Diagnosis not present

## 2019-11-17 NOTE — Patient Outreach (Signed)
[facility] [facility]) Care Management  11/17/2019  Christine Butler Mar 24, 1970 [id number]   Transition of care follow up call   Referral received:8/25/2` Initial outreach:11/11/19 Insurance: [employer]     Subjective: Successful follow up call to patient, she reports that she continues to make good progress. She reports tolerating taking a shower with her sister support, tolerating walking up stairs to shower, taking rest break at stair landing. She reports that transplant coordinator has arranged outpatient physical therapy , initial session in the next week. Patient reports labs draws at [facility] going well and hopefully frequency will become less than current 3 days a week, patient reports being satisfied with current arrangements for lab and outpatient therapy, without seeking other plans.  Patient discussed incision area looks good, she continues to work on drinking at least 80 ounces of fluid daily. She discussed being pleased with decrease in swelling in lower legs. She has post discharge visit on 11/21/19 hoping staples at incision may be removed.            Objective: Christine Butler was hospitalized at [facility]  from 8/23 - 11/11/19 Liver Transplant  Comorbidities include: Endstage liver disease , cirrhosis, primary cholangitis, gastric varices s/p banding, heart failure, cabg, agitated Catatonia.  She  was discharged to home on 9/17/2 outpatient lab work at [facility], and outpatient physical therapy to be arranged, patient was provided rollator at discharge.   Plan: Will plan follow up call in the next week.to assess for ongoing care management needs prior to case closure.   [name], RN, BSN  [place] Management Coordinator  [phone]- Mobile [phone]- Toll Free Main Office

## 2019-11-18 DIAGNOSIS — Z944 Liver transplant status: Principal | ICD-10-CM

## 2019-11-18 DIAGNOSIS — Z79899 Other long term (current) drug therapy: Secondary | ICD-10-CM | POA: Diagnosis not present

## 2019-11-18 MED ORDER — TACROLIMUS 1 MG CAPSULE, IMMEDIATE-RELEASE
ORAL_CAPSULE | 11 refills | 0 days | Status: CP
Start: 2019-11-18 — End: ?

## 2019-11-18 MED FILL — TACROLIMUS 1 MG CAPSULE: 1 | 30 days supply | Qty: 390 | Fill #0

## 2019-11-21 ENCOUNTER — Ambulatory Visit
Admit: 2019-11-21 | Discharge: 2019-11-21 | Payer: PRIVATE HEALTH INSURANCE | Attending: Student in an Organized Health Care Education/Training Program | Primary: Student in an Organized Health Care Education/Training Program

## 2019-11-21 ENCOUNTER — Institutional Professional Consult (permissible substitution): Admit: 2019-11-21 | Discharge: 2019-11-21 | Payer: PRIVATE HEALTH INSURANCE

## 2019-11-21 ENCOUNTER — Ambulatory Visit
Admit: 2019-11-21 | Discharge: 2019-11-21 | Payer: PRIVATE HEALTH INSURANCE | Attending: Nutritionist | Primary: Nutritionist

## 2019-11-21 DIAGNOSIS — Z79899 Other long term (current) drug therapy: Principal | ICD-10-CM

## 2019-11-21 DIAGNOSIS — Z944 Liver transplant status: Principal | ICD-10-CM

## 2019-11-21 DIAGNOSIS — M810 Age-related osteoporosis without current pathological fracture: Principal | ICD-10-CM

## 2019-11-21 DIAGNOSIS — Z114 Encounter for screening for human immunodeficiency virus [HIV]: Principal | ICD-10-CM

## 2019-11-21 DIAGNOSIS — Z7721 Contact with and (suspected) exposure to potentially hazardous body fluids: Principal | ICD-10-CM

## 2019-11-21 DIAGNOSIS — D649 Anemia, unspecified: Principal | ICD-10-CM

## 2019-11-21 DIAGNOSIS — Z7289 Other problems related to lifestyle: Principal | ICD-10-CM

## 2019-11-21 DIAGNOSIS — Z95828 Presence of other vascular implants and grafts: Principal | ICD-10-CM

## 2019-11-21 DIAGNOSIS — Z130 Encounter for screening for diseases of the blood and blood-forming organs and certain disorders involving the immune mechanism: Principal | ICD-10-CM

## 2019-11-21 DIAGNOSIS — Z1159 Encounter for screening for other viral diseases: Principal | ICD-10-CM

## 2019-11-21 DIAGNOSIS — Z5181 Encounter for therapeutic drug level monitoring: Secondary | ICD-10-CM | POA: Diagnosis not present

## 2019-11-21 HISTORY — DX: Presence of other vascular implants and grafts: Z95.828

## 2019-11-21 LAB — COMPREHENSIVE METABOLIC PANEL
ALBUMIN: 3.6 g/dL (ref 3.4–5.0)
ALKALINE PHOSPHATASE: 96 U/L (ref 46–116)
ALT (SGPT): 9 U/L — ABNORMAL LOW (ref 10–49)
AST (SGOT): 11 U/L (ref <=34)
BILIRUBIN TOTAL: 0.6 mg/dL (ref 0.3–1.2)
BLOOD UREA NITROGEN: 15 mg/dL (ref 9–23)
BUN / CREAT RATIO: 11
CALCIUM: 8.9 mg/dL (ref 8.7–10.4)
CHLORIDE: 108 mmol/L — ABNORMAL HIGH (ref 98–107)
CO2: 18 mmol/L — ABNORMAL LOW (ref 20.0–31.0)
CREATININE: 1.35 mg/dL — ABNORMAL HIGH
EGFR CKD-EPI AA FEMALE: 53 mL/min/{1.73_m2} — ABNORMAL LOW (ref >=60)
EGFR CKD-EPI NON-AA FEMALE: 46 mL/min/{1.73_m2} — ABNORMAL LOW (ref >=60)
GLUCOSE RANDOM: 101 mg/dL (ref 70–179)
PROTEIN TOTAL: 6.1 g/dL (ref 5.7–8.2)
SODIUM: 137 mmol/L (ref 135–145)

## 2019-11-21 LAB — IRON SATURATION: Iron saturation:MFr:Pt:Ser/Plas:Qn:: 61

## 2019-11-21 LAB — CBC W/ AUTO DIFF
BASOPHILS ABSOLUTE COUNT: 0 10*9/L (ref 0.0–0.1)
BASOPHILS RELATIVE PERCENT: 0.6 %
EOSINOPHILS ABSOLUTE COUNT: 0.1 10*9/L (ref 0.0–0.4)
EOSINOPHILS RELATIVE PERCENT: 1.1 %
HEMOGLOBIN: 8.4 g/dL — ABNORMAL LOW (ref 12.0–16.0)
LARGE UNSTAINED CELLS: 1 % (ref 0–4)
LYMPHOCYTES ABSOLUTE COUNT: 0.6 10*9/L — ABNORMAL LOW (ref 1.5–5.0)
LYMPHOCYTES RELATIVE PERCENT: 13.2 %
MEAN CORPUSCULAR HEMOGLOBIN CONC: 34.3 g/dL (ref 31.0–37.0)
MEAN CORPUSCULAR HEMOGLOBIN: 33.8 pg (ref 26.0–34.0)
MEAN CORPUSCULAR VOLUME: 98.8 fL (ref 80.0–100.0)
MEAN PLATELET VOLUME: 7.7 fL (ref 7.0–10.0)
MONOCYTES ABSOLUTE COUNT: 0.2 10*9/L (ref 0.2–0.8)
MONOCYTES RELATIVE PERCENT: 4 %
NEUTROPHILS RELATIVE PERCENT: 80.1 %
PLATELET COUNT: 119 10*9/L — ABNORMAL LOW (ref 150–440)
RED BLOOD CELL COUNT: 2.49 10*12/L — ABNORMAL LOW (ref 4.00–5.20)
RED CELL DISTRIBUTION WIDTH: 19.5 % — ABNORMAL HIGH (ref 12.0–15.0)
WBC ADJUSTED: 4.3 10*9/L — ABNORMAL LOW (ref 4.5–11.0)

## 2019-11-21 LAB — IRON PANEL: IRON SATURATION: 61 %

## 2019-11-21 LAB — GAMMA GLUTAMYL TRANSFERASE: Gamma glutamyl transferase:CCnc:Pt:Ser/Plas:Qn:: 20

## 2019-11-21 LAB — PHOSPHORUS: Phosphate:MCnc:Pt:Ser/Plas:Qn:: 4.2

## 2019-11-21 LAB — BILIRUBIN DIRECT: Bilirubin.glucuronidated+Bilirubin.albumin bound:MCnc:Pt:Ser/Plas:Qn:: 0.4 — ABNORMAL HIGH

## 2019-11-21 LAB — SMEAR REVIEW

## 2019-11-21 LAB — TACROLIMUS, TROUGH: Lab: 8.5

## 2019-11-21 LAB — MACROCYTES

## 2019-11-21 LAB — EGFR CKD-EPI NON-AA FEMALE
Glomerular filtration rate/1.73 sq M.predicted.non black:ArVRat:Pt:Ser/Plas/Bld:Qn:Creatinine-based formula (CKD-EPI): 46 — ABNORMAL LOW

## 2019-11-21 LAB — FERRITIN: Ferritin:MCnc:Pt:Ser/Plas:Qn:: 379.9 — ABNORMAL HIGH

## 2019-11-21 LAB — MAGNESIUM: Magnesium:MCnc:Pt:Ser/Plas:Qn:: 1.3 — ABNORMAL LOW

## 2019-11-21 MED ORDER — URSODIOL 300 MG CAPSULE
ORAL_CAPSULE | Freq: Three times a day (TID) | ORAL | 11 refills | 30.00000 days | Status: CP
Start: 2019-11-21 — End: 2020-11-20

## 2019-11-21 MED ORDER — LOPERAMIDE 2 MG CAPSULE
ORAL_CAPSULE | Freq: Three times a day (TID) | ORAL | 5 refills | 17 days | Status: CP | PRN
Start: 2019-11-21 — End: ?

## 2019-11-21 MED ORDER — MELATONIN 3 MG TABLET
ORAL_TABLET | Freq: Every evening | ORAL | 3 refills | 100 days | Status: CP
Start: 2019-11-21 — End: ?

## 2019-11-21 MED ORDER — FLUTICASONE PROPIONATE 50 MCG/ACTUATION NASAL SPRAY,SUSPENSION
Freq: Every day | NASAL | 5 refills | 0 days | Status: CP | PRN
Start: 2019-11-21 — End: ?

## 2019-11-21 MED ORDER — PREDNISONE 5 MG TABLET
ORAL_TABLET | Freq: Every day | ORAL | 11 refills | 30.00000 days | Status: CP
Start: 2019-11-21 — End: 2020-11-20
  Filled 2019-12-14: qty 30, 30d supply, fill #0

## 2019-11-21 MED ORDER — MYCOPHENOLATE SODIUM 180 MG TABLET,DELAYED RELEASE
ORAL_TABLET | Freq: Two times a day (BID) | ORAL | 11 refills | 30.00000 days | Status: CP
Start: 2019-11-21 — End: 2020-11-20

## 2019-11-21 MED ORDER — CARVEDILOL 25 MG TABLET
ORAL_TABLET | Freq: Two times a day (BID) | ORAL | 11 refills | 30 days | Status: CP
Start: 2019-11-21 — End: 2020-11-20
  Filled 2019-11-24: qty 60, 30d supply, fill #0

## 2019-11-21 MED ORDER — ASPIRIN 81 MG TABLET,DELAYED RELEASE
ORAL_TABLET | Freq: Every day | ORAL | 10 refills | 100.00000 days | Status: CP
Start: 2019-11-21 — End: ?

## 2019-11-21 MED ORDER — VALGANCICLOVIR 450 MG TABLET
ORAL_TABLET | Freq: Every day | ORAL | 2 refills | 30 days | Status: CP
Start: 2019-11-21 — End: 2020-02-19

## 2019-11-21 MED ORDER — OMEPRAZOLE 20 MG CAPSULE,DELAYED RELEASE
ORAL_CAPSULE | Freq: Every day | ORAL | 11 refills | 30 days | Status: CP
Start: 2019-11-21 — End: 2019-11-21
  Filled 2019-11-24: qty 30, 30d supply, fill #0

## 2019-11-21 MED ORDER — FOLIC ACID 1 MG TABLET
ORAL_TABLET | Freq: Every day | ORAL | 11 refills | 30.00000 days | Status: CP
Start: 2019-11-21 — End: 2020-11-20

## 2019-11-21 MED ORDER — OMEPRAZOLE 40 MG CAPSULE,DELAYED RELEASE
ORAL_CAPSULE | Freq: Every day | ORAL | 5 refills | 30 days | Status: CP
Start: 2019-11-21 — End: 2020-11-20

## 2019-11-21 MED ADMIN — heparin, porcine (PF) 100 unit/mL injection 500 Units: 500 [IU] | INTRAVENOUS | @ 13:00:00 | Stop: 2019-11-21

## 2019-11-21 NOTE — Unmapped (Signed)
See Pharmacy encounter for documentation of PORT access.

## 2019-11-21 NOTE — Unmapped (Signed)
[facility]patient [facility] Services   Medical Nutrition Therapy Consultation       Visit Type:    Return Assessment    Referral Reason: :  Post liver transplant 10/18/19       Kathy Armstrong is a 49 y.o. female seen for medical nutrition therapy for texture issues and early satiety. Her active problem list, medication list and notes from last encounter were reviewed.     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    Anthropometrics   Estimated body mass index is 25.92 kg/m?? as calculated from the following:    Height as of an earlier encounter on 11/21/19: 154.9 cm (5' 1).    Weight as of an earlier encounter on 11/21/19: 62.2 kg (137 lb 3.2 oz).    Wt Readings from Last 5 Encounters:   11/21/19 62.2 kg (137 lb 3.2 oz)   11/11/19 66.5 kg (146 lb 8 oz)   10/15/19 66.7 kg (147 lb 0.8 oz)   09/29/19 62.9 kg (138 lb 10.7 oz)   09/17/19 62.9 kg (138 lb 10.7 oz)      Usual body weight:~135- 140 lbs prior to transplant   Ideal Body Weight:   48.74 kg     Nutrition Risk Screening:     Nutrition Focused Physical Exam:  Nutrition Evaluation  Overall Impressions: No fat loss;No muscle loss (11/21/19 1333)    Malnutrition Screening:   Malnutrition Assessment using AND/ASPEN Clinical Characteristics:  Patient does not meet AND/ASPEN criteria for malnutrition at this time (11/21/19 1354)       Biochemical Data, Medical Tests and Procedures:  All pertinent labs and imaging reviewed by Lanelle Bal at 9:36 AM 11/21/2019.    Lab Results   Component Value Date    A1C <3.8 (L) 10/17/2019    A1C <3.8 (L) 10/14/2019    A1C <4.2 (L) 04/07/2019    GLU 101 11/21/2019    and   Lab Results   Component Value Date    BUN 15 11/21/2019    CREATININE 1.35 (H) 11/21/2019    NA 137 11/21/2019    K 4.0 11/21/2019    CL 108 (H) 11/21/2019    CO2 18.0 (L) 11/21/2019    CALCIUM 8.9 11/21/2019    PHOS 4.2 11/21/2019    ALBUMIN 3.6 11/21/2019       Medications and Vitamin/Mineral Supplementation:   All nutritionally pertinent medications reviewed on 11/21/2019.   Nutritionally pertinent medications include: Colace, Imodium, Prilosec, Zofran, Miralax, prednisone, thiamine, ursodiol  She is taking nutrition supplements.  magnesium oxide, multivitamin (DEKAS), folic acid     Current Outpatient Medications   Medication Sig Dispense Refill   ??? acetaminophen (TYLENOL) 325 MG tablet Take 2 tablets (650 mg total) by mouth every six (6) hours as needed for pain or fever (> 100.4 F). 100 tablet 0   ??? aspirin (ASPIR-81) 81 MG tablet Take 1 tablet (81 mg total) by mouth daily. 100 tablet 10   ??? carvediloL (COREG) 25 MG tablet Take 1 tablet (25 mg total) by mouth Two (2) times a day. 60 tablet 11   ??? fluticasone propionate (FLONASE) 50 mcg/actuation nasal spray 1 spray into each nostril daily as needed for rhinitis. 16 g 5   ??? folic acid (FOLVITE) 1 MG tablet Take 1 tablet (1 mg total) by mouth daily. 30  tablet 11   ??? loperamide (IMODIUM) 2 mg capsule Take 2 capsules (4 mg total) by mouth Three (3) times a day as needed for diarrhea. 100 capsule 5   ??? magnesium oxide-Mg AA chelate (MAGNESIUM, AMINO ACID CHELATE,) 133 mg Tab Take 1 tablet by mouth Two (2) times a day. HOLD until directed to start by your coordinator. 100 tablet 11   ??? melatonin 3 mg Tab Take 1 tablet (3 mg total) by mouth every evening. 100 tablet 3   ??? multivit with min #53-FA-K-Q10 (DEKAS PLUS, FOLIC ACID,) 200 mcg-1,000 [id number] mg cap Take 1 tablet by mouth. Take 1 tablet by mouth daily     ??? mycophenolate (MYFORTIC) 180 MG EC tablet Take 1 tablet (180 mg total) by mouth Two (2) times a day. 60 tablet 11   ??? NON FORMULARY Take 500 mg by mouth daily with evening meal. Vitafusion Calcium: Take two gummies by mouth daily     ??? OLANZapine (ZYPREXA) 5 MG tablet Take 1 tablet (5 mg total) by mouth nightly. 30 tablet 11 ??? omeprazole (PRILOSEC) 40 MG capsule Take 1 capsule (40 mg total) by mouth daily. 30 capsule 5   ??? ondansetron (ZOFRAN-ODT) 4 MG disintegrating tablet Dissolve 1 tablet on the tongue every twelve (12) hours as needed for nausea or vomiting for up to 7 doses. 7 tablet 0   ??? oxyCODONE (ROXICODONE) 5 MG immediate release tablet Take 1-2 tablets (5-10 mg total) by mouth every six (6) hours as needed for pain. 40 tablet 0   ??? pentamidine in sterile water (PF) Inhale 6 mL (300 mg total) every twenty-eight (28) days. 6 mL 5   ??? predniSONE (DELTASONE) 5 MG tablet Take 1 tablet (5 mg total) by mouth daily. 30 tablet 11   ??? tacrolimus (PROGRAF) 1 MG capsule Take 7mg  in the morning and 6mg  at night (holding 9/24 pm dose and 9/25 am dose) 390 capsule 11   ??? ursodioL (ACTIGALL) 300 mg capsule Take 1 capsule (300 mg total) by mouth Three (3) times a day. 90 capsule 11   ??? valGANciclovir (VALCYTE) 450 mg tablet Take 1 tablet (450 mg total) by mouth daily. 30 tablet 2     No current facility-administered medications for this visit.     Facility-Administered Medications Ordered in Other Visits   Medication Dose Route Frequency Provider Last Rate Last Admin   ??? heparin, porcine (PF) 100 unit/mL injection 500 Units  500 Units Intravenous Q30 Min PRN Jordan Likes, CPP   500 Units at 11/21/19 0915       Nutrition History:     Dietary Restrictions: No known food allergies or food intolerances.     Gastrointestinal Issues: Nausea Vomiting Increased gas and bloating Bowel movements per day: 4-6 despite Imodium 2x per day      Hunger and Satiety: Early satiety.    Food Safety and Access: No to little issues noted.     Diet Recall:   Time Intake   Breakfast Frosted mini wheats or cheesy toast   Snack (AM) Clear premier Protein + fruit (made into popsicle)   Lunch    Snack (PM) Apple with chia seeds    Dinner    Snack (HS) Watermelon (2 large slices) or lays chips     Food-Related History:  Beverages:  Water (has been working on staying hydrated)  Dining Out:  Not since before transplant       Physical Activity:  Physical activity level is light with  some exercise.  She is able to walk up stairs and does light household chores. She walks slowly but can walk short distances without walker    Daily Estimated Nutrient Needs:  Energy: 2040-2380 kcals [30-35 kcal/kg using admission body weight, 68 kg]  Protein: 74-98 gm [1.5-2.0 gm/kg using ideal body weight, 49 kg]  Fluid: [per MD team]    Nutrition Goals & Evaluation      1. Meet estimated daily needs (Not Met)  2. Normal vitamin levels (Progressing)  3. Balanced macronutrient intake (Not Met)    Nutrition goals reviewed, and relevant barriers identified and addressed: acuity of illness. She is evaluated to have fair willingness and ability to achieve nutrition goals.     Nutrition Assessment     Per the patient's diet recall, nutrition intake is poor and does not meet estimated nutrition needs. Patient's intake of water and Premier Protein is appropriate for post transplant. Discussed importance of increasing overall calories in diet to improve overall nutrition and prevent further malnutrition.     Patient reports having a difficult time tolerating most foods with strong fragrances and most heavy foods. She has done well with starchy, crunchy foods. Suggested patient try nuts, couscous, lentils, beans and rice for calories. She was amenable to this, and hopes that recent changes in her medications will help improve her appetite. Patient also reports continued ~4 loose, orange-y bowel movements per day. This has improved slightly with medication changes.     Nutrition Intervention      Nutrition Education: post transplant diet, high calorie foods/snacks     Materials Provided were: Verbal tips and recommendations, patient took notes     Nutrition Plan:   ??? Increase caloric intake through starchy, high fiber foods you can tolerate at this time   ??? Eat 5-6 small, frequent protein containing meals per day   ??? Space meals out every 3-4 hours while awake  ??? Consume a night time snack   ??? Increase exercise to 30 minutes, 5 days per week   ??? Continue multivitamin daily     Follow up will occur in 2 months.     Food/Nutrition-related history and Patient understanding or compliance with intervention and recommendations  will be assessed at time of follow-up.         Recommendations for Care Team:  Monitor diarrhea and appetite, please schedule appointment with this writer if negative symptoms continue        Time spent 24 minutes       Lanelle Bal, RD, LDN  Abdominal Transplant Dietitian   Pager: [phone]

## 2019-11-21 NOTE — Unmapped (Signed)
[facility] PHARMACY NOTE  11/21/2019   Kathy Armstrong  [id number]    Medication changes today:   1. Increase carvedilol to 25 mg BID  2. Decrease Myfortic to 180 mg BID  3. Increase omeprazole to 40 mg daily     Education/Adherence tools provided today:  1.provided updated medication list  2. provided additional pill box education  3.  provided additional education on immunosuppression and transplant related medications including reviewing indications of medications, dosing and side effects    Follow up items:  1. goal of understanding indications and dosing of immunosuppression medications  2. Consider rosuvastatin or atorvastatin at next visit (CAD s/p CABG history)  3. If abdominal pain/diarrhea improved with lower Myfortic dose  4. Consider changing pentamidine to atovaquone at next visit    Next visit with pharmacy in 1-2 weeks  ____________________________________________________________________    Kathy Armstrong is a 49 y.o. 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     Other PMH significant for HFpEF, CAD s/p CABG x2 (2019), HLD (familial)    Post op complicated by: catatonia w/psychiatry consul.    Seen by pharmacy today for: medication management and pill box fill and adherence education; last seen by pharmacy first visit     CC:  Patient complains of abdominal pain, 4-5 episodes of diarrhea daily, poor appetite    There were no vitals filed for this visit.    Allergies   Allergen Reactions   ??? Codeine Nausea And Vomiting   ??? Erythromycin Nausea And Vomiting   ??? Fentanyl Nausea And Vomiting       Medications reviewed in EPIC medication station and updated today by the clinical pharmacist practitioner.    Outpatient Encounter Medications as of 11/21/2019   Medication Sig Dispense Refill   ??? acetaminophen (TYLENOL) 325 MG tablet Take 2 tablets (650 mg total) by mouth every six (6) hours as needed for pain or fever (> 100.4 F). 100 tablet 0   ??? aspirin (ASPIR-81) 81 MG tablet Take 1 tablet by mouth daily.      ??? carvediloL (COREG) 12.5 MG tablet Take 1 tablet (12.5 mg total) by mouth Two (2) times a day. 60 tablet 11   ??? docusate sodium (COLACE) 100 MG capsule Take 1 capsule (100 mg total) by mouth two (2) times a day as needed for constipation. 30 capsule 2   ??? fluticasone propionate (FLONASE) 50 mcg/actuation nasal spray 1 spray into each nostril daily as needed for rhinitis.      ??? folic acid (FOLVITE) 1 MG tablet Take 1 tablet (1 mg total) by mouth daily. 30 tablet 11   ??? loperamide (IMODIUM) 2 mg capsule Take 2 capsules (4 mg total) by mouth Three (3) times a day as needed for diarrhea. 30 capsule 0   ??? [EXPIRED] LORazepam (ATIVAN) 0.5 MG tablet Take 1 tablet (0.5 mg total) by mouth nightly for 7 days. 7 tablet 0   ??? magnesium oxide-Mg AA chelate (MAGNESIUM, AMINO ACID CHELATE,) 133 mg Tab Take 1 tablet by mouth Two (2) times a day. HOLD until directed to start by your coordinator. 100 tablet 11   ??? melatonin 3 mg Tab Take 1 tablet (3 mg total) by mouth every evening. 30 tablet 3   ??? multivit with min #53-FA-K-Q10 (DEKAS PLUS, FOLIC ACID,) 200 mcg-1,000 [id number]-91 mg cap Take 1 tablet by mouth. Take 1 tablet by mouth daily     ??? mycophenolate (MYFORTIC) 180 MG EC tablet  Take 1 tablet (180 mg total) by mouth Three (3) times a day. 90 tablet 11   ??? NON FORMULARY Take 500 mg by mouth daily with evening meal. Vitafusion Calcium: Take two gummies by mouth daily     ??? OLANZapine (ZYPREXA) 5 MG tablet Take 1 tablet (5 mg total) by mouth nightly. 30 tablet 11   ??? omeprazole (PRILOSEC) 20 MG capsule Take 1 capsule (20 mg total) by mouth daily. 30 capsule 11   ??? ondansetron (ZOFRAN-ODT) 4 MG disintegrating tablet Dissolve 1 tablet on the tongue every twelve (12) hours as needed for nausea or vomiting for up to 7 doses. 7 tablet 0   ??? oxyCODONE (ROXICODONE) 5 MG immediate release tablet Take 1-2 tablets (5-10 mg total) by mouth every six (6) hours as needed for pain. 40 tablet 0   ??? pentamidine in sterile water (PF) Inhale 6 mL (300 mg total) every twenty-eight (28) days. 6 mL 5   ??? polyethylene glycol (MIRALAX) 17 gram/dose powder Mix 1 capful (17 g) in 8oz of water, tea, coffee, or juice and drink by mouth daily as needed. 238 g 2   ??? predniSONE (DELTASONE) 5 MG tablet Take 1 tablet (5 mg total) by mouth daily. 30 tablet 11   ??? tacrolimus (PROGRAF) 1 MG capsule Take 7mg  in the morning and 6mg  at night (holding 9/24 pm dose and 9/25 am dose) 390 capsule 11   ??? thiamine (B-1) 100 MG tablet Take 1 tablet (100 mg total) by mouth daily. 110 tablet 11   ??? ursodioL (ACTIGALL) 300 mg capsule Take 1 capsule (300 mg total) by mouth Three (3) times a day. 90 capsule 11   ??? valGANciclovir (VALCYTE) 450 mg tablet Take 1 tablet (450 mg total) by mouth daily. 30 tablet 2     No facility-administered encounter medications on file as of 11/21/2019.     Induction agent : basiliximab    CURRENT IMMUNOSUPPRESSION: tacrolimus 7 mg PO bid  prograf/Envarsus/cyclosporine goal: 8-10   myfortic180  mg PO tid    prednisone 5 mg daily (per PBC protocol)    Patient complains of diarrhea 4-5 times per day, abdominal pain, moderate tremor, and constant L temporal HA    IMMUNOSUPPRESSION DRUG LEVELS:  Lab Results   Component Value Date    Tacrolimus, Trough 10.4 11/14/2019    Tacrolimus, Trough 1.0 (L) 10/19/2019    Tacrolimus Lvl 11.7 11/18/2019    Tacrolimus Lvl 15.3 11/16/2019    Tacrolimus, Timed 5.9 11/11/2019    Tacrolimus, Timed 6.2 11/09/2019    Tacrolimus, Timed 5.0 11/07/2019     No results found for: CYCLO  No results found for: EVEROLIMUS  No results found for: SIROLIMUS    Prograf level is accurate 12 hour trough    Graft function: stable  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  If HA remains constant consider transition to Envarsus. Continue to monitor.    OI Prophylaxis:   CMV Status: D+/ R+, moderate risk . CMV prophylaxis: valganciclovir 450 mg daily x 3 months per protocol.  No results found for: CMVCP  PCP Prophylaxis: pentamidine 300 mg inhalation monthly (last dose 9/15 )x 6 months (switched from Bactrim 2/2 hyperkalemia)  Thrush:  completed in hospital  Patient is  tolerating  infectious prophylaxis well    Plan: Given abdominal pain will c/w pentamidine.  Once abdominal pain/nausea improve consider transition to atovaquone. Continue to monitor.    CAD s/p CABG: asa 81 mg   The 10-year ASCVD risk score Denman George DC Jr., et al., 2013) is: 3.1%  Statin therapy: Indicated; currently on no statin, was on alirocumab pre transplant (lipid specialist had avoided statin 2/2 ESLD)  Plan: At next visit consider starting rosuvastatin or atorvastatin given CAD history.  Told pt she could resume alirocumab (pt to make cardiology appointment). Continue to monitor     BP: Goal < 140/90. Clinic vitals reported above  Home BP ranges:  Date AM BP PM BP   11/14/2019 143/65 148/63   11/15/2019 132/63    11/16/2019 143/83    11/17/2019 155/81    11/18/2019 150/82 154/76   11/19/2019 156/78 142/69   11/20/2019 134/70 152/86     Current meds include: carvedilol 12.5 mg BID  Plan: out of goal increase carvedilol to 25 mg BID . Continue to monitor    Anemia:  H/H:   Lab Results   Component Value Date    HGB 8.5 (L) 11/18/2019     Lab Results   Component Value Date    HCT 25.8 (L) 11/18/2019     Iron panel:  Lab Results   Component Value Date    IRON 19 (L) 11/03/2019    TIBC 222 (L) 11/03/2019    FERRITIN 288.0 (H) 11/03/2019     Lab Results   Component Value Date    Iron Saturation (%) 9 11/03/2019     Prior ESA use: none post transplant    Plan: below goal but stable. Check iron panel. Continue to monitor.     DM:   Lab Results   Component Value Date    A1C <3.8 (L) 10/17/2019   . Goal A1c < 7  History of Dm? No  Plan: Continue to monitor    Fluid intake: 32 oz daily  Plan: Increase fluid intake as able (difficulty given nausea)    Electrolytes: mag 1.3  Meds currently on: none  Plan: Start mg plus protein 133 mg BID. Continue to monitor     GI/BM: Pt reports diarrhea 4-5 times per day, nausea (vomited once since DC), poor appetite  Meds currently on: loperamide 2 mg TID PRN (using 4 mg BID), Dekas Plus 1 tablet daily, thiamine 100 mg daily, docusate 100 mg PRN (not using), Miralax PRN (not using), omeprazole 20 mg daily, ondansetron 4 mg BID PRN (infrequent use as doesn't find helpful)  Plan: Increase omeprazole to 40 mg daily.  Decreased Myfortic as above    PBC  Meds currently on: urosdiol 300 mg TID  Plan: Continue to monitor    Psych/insomnia - reports mentation is back to baseline; sleeping very well  Meds currently on: olanzapine 5 mg HS, melatonin 3 mg HS   Plan: Defer to psychiatry (follow up 10/5)    Pain: pt reports mild HA pain  Meds currently on: APAP 650 mg PRN (QD), oxycodone 5-10 mg q6h PRN (not using)  Plan: Encouraged increased APAP use Continue to monitor    Bone health:   Vitamin D Level: none available. Goal > 30.   Last DEXA results:  04/07/19 osteoporosis of lumbar spine and hip)  Current meds include: Reclast 5 mg q12 months, Vitafusion calcium 1g daily  Plan: Vitamin D level  pending,at next visit can evaluate SCr and decide if ok to  schedule annual Reclast apt locally. Continue to monitor.     Women's/Men's Health:  Omni Dunsworth is a 49 y.o. Female perimenopausal. Patient reports no men's/women's health issues  Plan: Continue to monitor    Pharmacy Preference:  [facility] for tacrolimus, all other meds from [facility]    Medication Access Issues:  none    Adherence: Patient has average understanding of medications; was able to independently identify names/doses of immunosuppressants and OI meds.  Patient  does fill their own pill box on a regular basis at home.  Patient brought medication card:yes  Pill box:was correct  Plan: Provided extensive adherence counseling/intervention    I spent a total of 35 minutes face to face with the patient delivering clinical care and providing education/counseling.  Patient was reviewed with Dr. Doyne Keel who was agreement with the stated plan:     During this visit, the following was completed:   BG log data assessment  BP log data assessment  Labs ordered and evaluated  complex treatment plan >1 DS     All questions/concerns were addressed to the patient's satisfaction.  __________________________________________  Cleone Slim, PHARMD, CPP  [facility] CLINICAL PHARMACIST PRACTITIONER  PAGER [phone]

## 2019-11-21 NOTE — Unmapped (Signed)
Set up for welcome/onboard later in the week.  Patient has to get tacrolimus at [facility].

## 2019-11-21 NOTE — Unmapped (Signed)
FOLLOW UP CLINIC NOTE       Date of Service: 11/21/2019    Current complaint: 1 month follow up for liver transplant      Assessment and Plan:   Kathy Armstrong is a 49 y.o. female who underwent OLT on 10/18/19 here for 1 month f/u.    Pt is doing very well. Cr is 1.35 today and is down trending. AST and ALT are stable. Hgb is 8.4 and platelet is 119. Pts current Tac level on 9/24 is 11.7. Pt also experiences 3-4 episodes of diarrhea likely due to her mycophenolate.  - Reduce mycophenolate 180mg  from TID to BID  - Plan for follow up in 2 weeks for staples removal.    History of Present Illness:   Kathy Armstrong is a 49 y.o. female who underwent  OLT on 10/18/19. She continues to progress post-operatively in terms of his activity and exercise levels. She is walking up and down the stairs and doing light work at her house. Pt reports that she is drinking plenty of water but is having some issues getting back to her regular appetite. She mentioned that she is drinking premier clear peach protein shakes and is snacking every few hours. She additionally continues to have 3-4 episodes of diarrhea likely due to her mycophenolate. She denies any fever, cough, SOB or chest pain. Pt is taking her medications as recommended.     Today's labs were remarkable for a down trending Cr (1.35). AST, ALT and hgb is stable.       Physical Exam:  BP 160/84  - Pulse 76  - Temp 36.8 ??C (98.3 ??F)  - Ht 154.9 cm (5' 1)  - Wt 62.2 kg (137 lb 3.2 oz)  - BMI 25.92 kg/m??   General Appearance:  No acute distress, well appearing and well nourished.   Head:  Normocephalic, atraumatic.   Eyes:  No scleral icterus.   Pulmonary:    Normal respiratory effort.   Cardiovascular:  Regular rate and rhythm.   Abdomen:   Abdominal scar is healing well and w/o erythema or leakage.    Neurologic: Non-focal exam.         Lab Results   Component Value Date    WBC 4.3 (L) 11/21/2019    HGB 8.4 (L) 11/21/2019    HCT 24.6 (L) 11/21/2019    PLT 119 (L) 11/21/2019 Lab Results   Component Value Date    NA 137 11/21/2019    K 4.0 11/21/2019    CL 108 (H) 11/21/2019    CO2 18.0 (L) 11/21/2019    BUN 15 11/21/2019    CREATININE 1.35 (H) 11/21/2019    CALCIUM 8.9 11/21/2019    MG 1.3 (L) 11/21/2019    PHOS 4.2 11/21/2019      Lab Results   Component Value Date    ALKPHOS 96 11/21/2019    BILITOT 0.6 11/21/2019    BILIDIR 0.40 (H) 11/21/2019    PROT 6.1 11/21/2019    ALBUMIN 3.6 11/21/2019    ALT 9 (L) 11/21/2019    AST 11 11/21/2019    GGT 20 11/21/2019      Lab Results   Component Value Date    APTT 27.6 11/03/2019          IMAGING:  All recent imaging were reviewed.     Felecia Shelling, MS3

## 2019-11-21 NOTE — Unmapped (Signed)
0900 R upper chest PORT accessed using 20g 1 inch Huber needle with good return and flush. Labs drawn and sent. PORT flushed and heparin locked per protocol. Port remains accessed for possible hydration needs, pending lab.    1225 No hydration needed per Liver Coordinator, KJ. PORT deaccessed. Patient tolerated the procedure well.

## 2019-11-21 NOTE — Unmapped (Signed)
Please see pharmacy note to correspond with charges.

## 2019-11-22 ENCOUNTER — Ambulatory Visit: Payer: 59 | Admitting: Physical Therapy

## 2019-11-22 DIAGNOSIS — Z944 Liver transplant status: Principal | ICD-10-CM

## 2019-11-22 DIAGNOSIS — Z79899 Other long term (current) drug therapy: Principal | ICD-10-CM

## 2019-11-22 LAB — VITAMIN D 25 HYDROXY: VITAMIN D, TOTAL (25OH): 15.4 ng/mL — ABNORMAL LOW (ref 20.0–80.0)

## 2019-11-23 DIAGNOSIS — Z944 Liver transplant status: Principal | ICD-10-CM

## 2019-11-23 DIAGNOSIS — E559 Vitamin D deficiency, unspecified: Principal | ICD-10-CM

## 2019-11-23 LAB — HEPATITIS C RNA, QUANTITATIVE, PCR: HCV RNA: NOT DETECTED

## 2019-11-23 MED ORDER — ERGOCALCIFEROL (VITAMIN D2) 1,250 MCG (50,000 UNIT) CAPSULE
ORAL_CAPSULE | ORAL | 0 refills | 56.00000 days | Status: CP
Start: 2019-11-23 — End: 2020-11-22
  Filled 2019-11-25: qty 4, 28d supply, fill #0

## 2019-11-23 NOTE — Unmapped (Signed)
Received message from patient:  ----- Message -----       From:Kathy Armstrong       Sent:11/21/2019  4:06 PM EDT         FA:[id number] Kathy Armstrong    Subject:Scheduling question    [place] Faster    Dr zendel said he wanted to take out my staples in two weeks . Is this something I need to schedule or do y'all set that up . I'm sorry I just thought about it . So does I'm guessing that means my restrictions stay the same ? Thank you for all your help so far !!    Kathy Armstrong     Sent message to TPA:   I received this message from Kathy Armstrong saying Dr. Doyne Keel wanted her to return in 2 weeks, which would be October 11.  Can you schedule her for nurse visit at 8:30am to draw labs from port-a-cath? and place this in the appointment.  Pharmacy after  Dr. Doyne Keel after this with a note to remove staples.     Sent message to patient:  Hi Kathy Armstrong.     It was nice to put the name with the face yesterday and meet you in person.     Thanks for letting us know. I have sent a message to Priya asking her to schedule a nurse visit for labs from your port, pharmacy appointment, and then Dr. Doyne Keel appointment to remove staples.     Same restrictions apply for now. No driving until no staples (and no pain medications).     I asked Priya to send you a message when these are scheduled for October 11.     Take care,  [name]

## 2019-11-23 NOTE — Unmapped (Signed)
This onboarding contains the following medication:  1) Myfortic  2) [facility] Ha[facility]   Patient Onboarding/Medication Counseling    Kathy Armstrong is a 49 y.o. female with a liver transplant who I am counseling today on continuation of therapy.  I am speaking to the patient.    Was a Nurse, learning disability used for this call? No    Verified patient's date of birth / HIPAA.    Specialty medication(s) to be sent: Transplant: None- patient has over 2 weeks of mycophenolate and valganciclovir on hand.      Non-specialty medications/supplies to be sent: none      Medications not needed at this time: none     The patient declined counseling on missed dose instructions, goals of therapy, side effects and monitoring parameters, warnings and precautions, drug/food interactions and storage, handling precautions, and disposal because they have taken the medication previously. The information in the declined sections below are for informational purposes only and was not discussed with patient.       Valcyte (valganciclovir)    Medication & Administration     Dosage:   ??? Take one tablet daily.    Administration:   ??? Take with food  ??? Swallow the pills whole, do not break, crush, or chew    Adherence/Missed dose instructions:  ??? Take a missed dose as soon as you think about it with food  ??? If it is close to your next dose, skip the missed dose and go back to your normal time.  ??? Do not take 2 doses at the same time or extra doses.  ??? Report any missed doses to coordinator    Goals of Therapy     ??? To prevent or treat CMV infection in setting of solid organ transplant    Side Effects & Monitoring Parameters   ??? Common side effects  ??? Headache  ??? Diarrhea or constipation  ??? Appetite or sleep disturbances  ??? Back, muscle, joint, or belly pain  ??? Weight loss  ??? Dizziness  ??? Muscle spasm  ??? Upset stomach or vomiting    ??? The following side effects should be reported to the provider:  ??? Allergic reaction  (rash, hives, swelling, blistered or peeling skin, shortness of breath)  ??? Infection (fever, chills, sore throat, ear/sinus pain, cough, sputum change, urinary pain, mouth sores, non-healing wounds)  ??? Bleeding (cough ground vomit, blood in urine, black/red/tarry stools, unexplained bruising or bleeding)  ??? Electrolyte problems (mood changes, confusion, weakness, abnormal heartbeat, seizures)  ??? Kidney problems (urine changes, weight gain)  ??? Yellowing skin or eyes  ??? Swelling in arms, legs, stomach  ??? Severe dizziness or passing out  ??? Eye issues (eyesight changes, pain, or irritation)  ??? Night sweats    ??? Monitoring parameters  ??? Have eye exam as directed by doctor  ??? CMV counts  ??? CBC  ??? Renal function  ??? Pregnancy test prior to initiation    Contraindications, Warnings, & Precautions   ??? BBW: severe leukopenia, neutropenia, anemia, thrombocytopenia, pancytopenia, and bone marrow failure, including aplastic anemia have been reported  ??? BBW: may cause temporary or permanent inhibition of spermatogenesis and suppression of fertilty; has the potential to cause birth defects and cancers in humans  ??? Female patients should have pregnancy test prior to initiation and use birth control for at least 30 days after discontinuation  ??? Female patients should use a barrier contraceptive while on therapy and  for 90 days after discontinuation  ??? Acute renal failure  ??? Not indicated for use in liver transplant recipients  ??? Breastfeeding is not recommended    Drug/Food Interactions   ??? Medication list reviewed in Epic. The patient was instructed to inform the care team before taking any new medications or supplements. No drug interactions identified.   ??? Check with your doctor before getting any vaccinations (live or inactivated)    Storage, Handling Precautions, & Disposal   ??? Store at room temperature  ??? Keep away from children and pets    The patient declined counseling on missed dose instructions, goals of therapy, side effects and monitoring parameters, warnings and precautions, drug/food interactions and storage, handling precautions, and disposal because they have taken the medication previously. The information in the declined sections below are for informational purposes only and was not discussed with patient.     Myfortic (mycophenolic acid)    Medication & Administration     Dosage:   ??? Take one tablet two times a day.    Administration:   ??? Take with or without food, although taking with food helps minimize GI side effects.  ??? Swallow the pills whole, do not chew or crush    Adherence/Missed dose instructions:  ??? Take a missed dose as soon as you think about it.  ??? If it is less than 2 hours until your next dose, skip the missed dose and go back to your normal time.  ??? Do not take 2 doses at the same time or extra doses.    Goals of Therapy     ??? To prevent organ rejection    Side Effects & Monitoring Parameters     ??? Common side effects  ??? Back or joint pain  ??? Constipation  ??? Headache/dizziness  ??? Not hungry  ??? Stomach pain, diarrhea, constipation, gas, upset stomach, vomiting, nausea  ??? Feeling tired or weak  ??? Shakiness  ??? Trouble sleeping  ??? Increased risk of infection    ??? The following side effects should be reported to the provider:  ??? Allergic reaction  ??? High blood sugar (confusion, feeling sleepy, more thirst, more hungry, passing urine more often, flushing, fast breathing, or breath that smells like fruit)  ??? Electrolyte issues (mood changes, confusion, muscle pain or weakness, a heartbeat that does not feel normal, seizures, not hungry, or very bad upset stomach or throwing up)  ??? High or low blood pressure (bad headache or dizziness, passing out, or change in eyesight)  ??? Kidney issues (unable to pass urine, change in how much urine is passed, blood in the urine, or a big weight gain)  ??? Skin (oozing, heat, swelling, redness, or pain), UTI and other infections   ??? Chest pain or pressure  ??? Abnormal heartbeat  ??? Unexplained bleeding or bruising  ??? Abnormal burning, numbness, or tingling  ??? Muscle cramps,  ??? Yellowing of skin or eyes    ??? Monitoring parameters  ??? Pregnancy test initially prior to treatment and 8-10 days later then as needed)  ??? CBC weekly for first month then twice monthly for next 2 months, then monthly)  ??? Monitor Renal and liver functions  ??? Signs of organ rejection    Contraindications, Warnings, & Precautions     ??? *This is a REMS drug and an FDA-approved patient medication guide will be printed with each dispensation  ??? Black Box Warning: Infections   ??? Black Box Warning: Lymphoproliferative disorders -  risk of development of lymphoma and skin malignancy is increased  ??? Black Box Warning: Use during pregnancy is associated with increased risks of first trimester pregnancy loss and congenital malformations.   ??? Black Box Warning: Females of reproductive potential should use contraception during treatment and for 6 weeks after therapy is discontinued  ??? CNS depression  ??? New or reactivated viral infections  ??? Neutropenia  ??? Female patients and/or their female partners should use effective contraception during treatment of the female patient and for at least 3 months after last dose.  ??? Breastfeeding is not recommended during therapy and for 6 weeks after last dose    Drug/Food Interactions     ??? Medication list reviewed in Epic. The patient was instructed to inform the care team before taking any new medications or supplements. No drug interactions identified.   ??? Do not take Echinacea while on this medication  ??? Check with your doctor before getting any vaccinations (live or inactivated)    Storage, Handling Precautions, & Disposal     ??? Store at room temperature  ??? Keep away from children and pets  ??? This drug is considered hazardous and should be handled as little as possible.  Wash hands before and after touching pills. If someone else helps with medication administration, they should wear gloves.      Current Medications (including OTC/herbals), Comorbidities and Allergies     Current Outpatient Medications   Medication Sig Dispense Refill   ??? acetaminophen (TYLENOL) 325 MG tablet Take 2 tablets (650 mg total) by mouth every six (6) hours as needed for pain or fever (> 100.4 F). 100 tablet 0   ??? aspirin (ASPIR-81) 81 MG tablet Take 1 tablet (81 mg total) by mouth daily. 100 tablet 10   ??? carvediloL (COREG) 25 MG tablet Take 1 tablet (25 mg total) by mouth Two (2) times a day. 60 tablet 11   ??? fluticasone propionate (FLONASE) 50 mcg/actuation nasal spray Use 1 spray into each nostril daily as needed for rhinitis. 16 g 5   ??? folic acid (FOLVITE) 1 MG tablet Take 1 tablet (1 mg total) by mouth daily. 30 tablet 11   ??? loperamide (IMODIUM) 2 mg capsule Take 2 capsules (4 mg total) by mouth Three (3) times a day as needed for diarrhea. 100 capsule 5   ??? magnesium oxide-Mg AA chelate (MAGNESIUM, AMINO ACID CHELATE,) 133 mg Tab Take 1 tablet by mouth Two (2) times a day. HOLD until directed to start by your coordinator. 100 tablet 11   ??? melatonin 3 mg Tab Take 1 tablet (3 mg total) by mouth every evening. 100 tablet 3   ??? multivit with min #[id number] (DEKAS PLUS, FOLIC ACID,) 200 mcg-1,000 ZOX-09 mg cap Take 1 tablet by mouth. Take 1 tablet by mouth daily     ??? mycophenolate (MYFORTIC) 180 MG EC tablet Take 1 tablet (180 mg total) by mouth Two (2) times a day. 60 tablet 11   ??? NON FORMULARY Take 500 mg by mouth daily with evening meal. Vitafusion Calcium: Take two gummies by mouth daily     ??? OLANZapine (ZYPREXA) 5 MG tablet Take 1 tablet (5 mg total) by mouth nightly. 30 tablet 11   ??? omeprazole (PRILOSEC) 40 MG capsule Take 1 capsule (40 mg total) by mouth daily. 30 capsule 5   ??? ondansetron (ZOFRAN-ODT) 4 MG disintegrating tablet Dissolve 1 tablet on the tongue every twelve (12) hours as needed for nausea or vomiting  for up to 7 doses. 7 tablet 0   ??? oxyCODONE (ROXICODONE) 5 MG immediate release tablet Take 1-2 tablets (5-10 mg total) by mouth every six (6) hours as needed for pain. 40 tablet 0   ??? pentamidine in sterile water (PF) Inhale 6 mL (300 mg total) every twenty-eight (28) days. 6 mL 5   ??? predniSONE (DELTASONE) 5 MG tablet Take 1 tablet (5 mg total) by mouth daily. 30 tablet 11   ??? tacrolimus (PROGRAF) 1 MG capsule Take 7mg  in the morning and 6mg  at night (holding 9/24 pm dose and 9/25 am dose) 390 capsule 11   ??? ursodioL (ACTIGALL) 300 mg capsule Take 1 capsule (300 mg total) by mouth Three (3) times a day. 90 capsule 11   ??? valGANciclovir (VALCYTE) 450 mg tablet Take 1 tablet (450 mg total) by mouth daily. 30 tablet 2     No current facility-administered medications for this visit.       Allergies   Allergen Reactions   ??? Codeine Nausea And Vomiting   ??? Erythromycin Nausea And Vomiting   ??? Fentanyl Nausea And Vomiting       Patient Active Problem List   Diagnosis   ??? Primary biliary cholangitis (CMS-HCC)   ??? S/P CABG x 2   ??? Cirrhosis (CMS-HCC)   ??? Hyponatremia with normal extracellular fluid volume   ??? Dyslipidemia, goal LDL below 70   ??? Hyponatremia with excess extracellular fluid volume   ??? (HFpEF) heart failure with preserved ejection fraction (CMS-HCC)   ??? Altered mental status   ??? Catatonia   ??? Other insomnia   ??? Ascites   ??? Pancytopenia (CMS-HCC)   ??? Encephalopathy   ??? Liver replaced by transplant (CMS-HCC)   ??? Acute blood loss anemia   ??? Atypical chest pain   ??? Atypical squamous cells of undetermined significance (ASCUS) on Papanicolaou smear of cervix   ??? CAD (coronary artery disease), native coronary artery   ??? GERD (gastroesophageal reflux disease)   ??? High grade squamous intraepithelial lesion (HGSIL) on cytologic smear of cervix   ??? Hypo-osmolality and hyponatremia   ??? IBS (irritable bowel syndrome)   ??? Upper GI bleed   ??? S/P LEEP (loop electrosurgical excision procedure)   ??? Arthritis   ??? PONV (postoperative nausea and vomiting)   ??? SVD (spontaneous vaginal delivery)   ??? Urinary tract bacterial infections   ??? Port-A-Cath in place       Reviewed and up to date in Epic.    Appropriateness of Therapy     Is medication and dose appropriate based on diagnosis? Yes    Prescription has been clinically reviewed: Yes    Baseline Quality of Life Assessment      How many days over the past month did your liver transplant  keep you from your normal activities? For example, brushing your teeth or getting up in the morning. 0 since discharge from hospital    Financial Information     Medication Assistance provided: None Required    Anticipated copay of $0 each mycophenolate, valganciclovir and prednisone reviewed with patient. Verified delivery address.    Delivery Information     Scheduled delivery date: Patient currently has over 2 weeks of medicine on hand and does not need anything today.    Expected start date: Patient has medication at home and is currently taking.    Medication will be delivered via UPS to the prescription address in [facility].  This shipment will  not require a signature.      Explained the services we provide at [facility] and that each month we would call to set up refills.  Stressed importance of returning phone calls so that we could ensure they receive their medications in time each month.  Informed patient that we should be setting up refills 7-10 days prior to when they will run out of medication.  A pharmacist will reach out to perform a clinical assessment periodically.  Informed patient that a welcome packet and a drug information handout will be sent.      Patient verbalized understanding of the above information as well as how to contact the pharmacy at [phone] option 4 with any questions/concerns.  The pharmacy is open Monday through Friday 8:30am-4:30pm.  A pharmacist is available 24/7 via pager to answer any clinical questions they may have.    Patient Specific Needs     - Does the patient have any physical, cognitive, or cultural barriers? No    - Patient prefers to have medications discussed with  Patient     - Is the patient or caregiver able to read and understand education materials at a high school level or above? Yes    - Patient's primary language is  English     - Is the patient high risk? Yes, patient is taking a REMS drug. Medication is dispensed in compliance with REMS program    - Does the patient require a Care Management Plan? No     - Does the patient require physician intervention or other additional services (i.e. nutrition, smoking cessation, social work)? No      Tera Helper  [facility] Specialty Pharmacist

## 2019-11-23 NOTE — Unmapped (Addendum)
Sent message to Kathy Armstrong, PharmD:  From: Kathy Armstrong   Sent: 11/23/2019 ?? 9:25 AM EDT   To: Kathy Armstrong, CPP   Subject: Vitamin D 15.4 ?? ?? ?? ?? ?? ?? ?? ?? ?? ?? ?? ?? ?? ?? ??     Hi.     Her vitamin D resulted at 15.4 from Monday.     Just let me know the dose you want.     Thanks,   Kathy Armstrong     Received message back from Kathy Armstrong saying to start ergocalciferol 50,000 units weekly x8 weeks         Received message from patient:  ----- Message -----       From:Kathy Armstrong       Sent:11/23/2019 12:16 PM EDT         [id number] Kathy Armstrong    Subject:Allergy med question    Kathy Armstrong ! It was so so nice to meet you in person . I'm excited to be working with you during this next phase of my journey . I'll put oct 11 on the calendar . I was wondering if there is any allergy meds I could take . I've got a bad headache today in the base of my skull which is new . The tac h/a has been temporal . And sneezing . I don't know if my Flonase would help the headache side of it . Tylenol isn't touching it today !    Thank you ,   Kathy Armstrong     Sent patient message about the vitamin D and answering her question:  Hi.     Before I get to your question, your vitamin D is low at 15.4 and we want it to be closer to 30. I sent a message to Kathy Armstrong with pharmacy this morning and she wants you to:  1)  start ergocalciferol 50,000 units (Vitamin D) only one time a week for 8 weeks. We usually recheck your vitamin D level at 3 months after transplant and we will determine how much vitamin D you will need after completing this.   This script was sent to [facility]; if they have not called you by tomorrow at lunch, then call them to have them send the vitamin D 50, 000 unit capsule once a week to you. Just pick the day of the week that you will remember to take it. Vitamin D helps your body absorb the calcium into your bones to help your bone health.     2) For allergies, you can take benadryl, allegra, claritin, zyrtec or nasalcrom. Of note: only take the medications as shown.  Do not take Allegra-D, claritin-D and so on because the added D so to speak is tough on your kidneys and we do not want to add another agent that can impact your kidneys.     I am sorry that you are having this headache and hope you can get relief soon.     Please send me a message back confirming you received this.     Take care,  Kim    Patient sent message back confirming received:    ----- Message -----       From:Kathy Armstrong       Sent:11/23/2019  4:01 PM EDT         [id number] Kathy Armstrong    Subject:RE: Allergy med question    I received your email ! The pharmacy called me today but that  was early this morning . I've got their number now so if I don't hear from them I'll call them   Kathy Armstrong     Sent message back to patient:  Thank you for confirming you received this message.   Sounds good and thank you.   I am glad you have their phone number. After you call their phone number [phone]), press option/number 4 when the recording comes on (and you may have to hit option 4 again) and it will transfer you to a live person.   Have a good night,  Kathy Armstrong

## 2019-11-24 ENCOUNTER — Ambulatory Visit: Payer: 59 | Attending: Family Medicine | Admitting: Physical Therapy

## 2019-11-24 ENCOUNTER — Other Ambulatory Visit: Payer: Self-pay

## 2019-11-24 ENCOUNTER — Encounter: Payer: Self-pay | Admitting: Physical Therapy

## 2019-11-24 DIAGNOSIS — Z79899 Other long term (current) drug therapy: Principal | ICD-10-CM

## 2019-11-24 DIAGNOSIS — Z944 Liver transplant status: Principal | ICD-10-CM

## 2019-11-24 DIAGNOSIS — R2689 Other abnormalities of gait and mobility: Secondary | ICD-10-CM | POA: Diagnosis not present

## 2019-11-24 DIAGNOSIS — M6281 Muscle weakness (generalized): Secondary | ICD-10-CM | POA: Diagnosis not present

## 2019-11-24 DIAGNOSIS — M62830 Muscle spasm of back: Secondary | ICD-10-CM | POA: Insufficient documentation

## 2019-11-24 DIAGNOSIS — R293 Abnormal posture: Secondary | ICD-10-CM | POA: Insufficient documentation

## 2019-11-24 DIAGNOSIS — R2681 Unsteadiness on feet: Secondary | ICD-10-CM | POA: Diagnosis not present

## 2019-11-24 LAB — HIV RNA, QUANTITATIVE, PCR

## 2019-11-24 LAB — HIV RNA: HIV 1 RNA:NCnc:Pt:Ser/Plas:Qn:Probe.amp.tar: 0

## 2019-11-24 MED FILL — CARVEDILOL 25 MG TABLET: 30 days supply | Qty: 60 | Fill #0 | Status: AC

## 2019-11-24 MED FILL — OMEPRAZOLE 40 MG CAPSULE,DELAYED RELEASE: 30 days supply | Qty: 30 | Fill #0 | Status: AC

## 2019-11-24 NOTE — Therapy (Signed)
[place], [place], [address] Phone: [phone]   Fax:  [phone]  Physical Therapy Evaluation  Patient Details  Name: Christine Butler MRN: [id number] Date of Birth: Jul 20, 1970 Referring Provider (PT): Lauralee Evener Placido Sou, NP    Encounter Date: 11/24/2019   PT End of Session - 11/24/19 1310    Visit Number 1    Number of Visits 17    Date for PT Re-Evaluation 01/19/20    Authorization Type [facility] UMR    PT Start Time 1330    PT Stop Time 1415    PT Time Calculation (min) 45 min    Activity Tolerance Patient tolerated treatment well    Behavior During Therapy [facility] for tasks assessed/performed           Past Medical History:  Diagnosis Date  . (HFpEF) heart failure with preserved ejection fraction ([name]) 08/09/2019  . Acute blood loss anemia 04/20/2017  . Arthritis    right knee  . Ascites--mild this admit 11/06/2017   per patient , now stable with meds and lifestlye adjustment  . Atypical chest pain 11/05/2017  . Atypical squamous cells of undetermined significance (ASCUS) on Papanicolaou smear of cervix 08/05/2018  . CAD (coronary artery disease)    a. s/p CABGx2 in 02/2017 (LAD not suitable for PCI), EF normal.  . CAD (coronary artery disease), native coronary artery 03/23/2017  . 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 Cirrhosis ([place]) 03/16/2017  . Coronary artery disease 03/24/2017  . Dyslipidemia, goal LDL below 70 10/13/2017  . Elevated LFTs 04/20/2017  . Familial hyperlipidemia   . GERD (gastroesophageal reflux disease)   . GI bleed 02/01/2018  . H/O LEEP 03/30/2019  . H/O two vessel coronary artery bypass graft 03/30/2019  . High grade squamous intraepithelial lesion (HGSIL) on cytologic smear of cervix 05/04/2019  . Hypo-osmolality and hyponatremia 03/30/2019  . IBS (irritable bowel syndrome)   . Other insomnia 09/05/2019  . PONV (postoperative nausea and vomiting)    i always throw up on waking up , but  last EGD in march had no issues    . Primary biliary cholangitis ([name]) 07/26/2012   Formatting of this note might be different from the original. IMOUPDATE  . Primary biliary cirrhosis (HCC)    cirhosis/liver disease followed by transplant team led by Dr Manuella Ghazi at [facility]   . S/P CABG (coronary artery bypass graft)   . S/P CABG x 2 03/24/2017   LIMA to DIAGONAL Portion of SVG/LEFT RADIAL to LAD  . SVD (spontaneous vaginal delivery)    x 3  . Upper GI bleed 02/01/2018  . Urinary tract bacterial infections     Past Surgical History:  Procedure Laterality Date  . CORONARY ARTERY BYPASS GRAFT N/A 03/24/2017   Procedure: CORONARY ARTERY BYPASS GRAFTING (CABG) x two, using left internal mammary artery, left radial artery, and right leg greater saphenous vein harvested endoscopically;  Surgeon: Ivin Poot, MD;  Location: [facility];  Service: Open Heart Surgery;  Laterality: N/A;  . ENDOVEIN HARVEST OF GREATER SAPHENOUS VEIN Right 03/24/2017   Procedure: ENDOVEIN HARVEST OF GREATER SAPHENOUS VEIN;  Surgeon: Ivin Poot, MD;  Location: [facility];  Service: Open Heart Surgery;  Laterality: Right;  . ESOPHAGEAL BANDING  02/01/2018   Procedure: ESOPHAGEAL BANDING;  Surgeon: [name], MD;  Location: [facility];  Service: Gastroenterology;;  . ESOPHAGEAL BANDING N/A 03/29/2018   Procedure: ESOPHAGEAL BANDING;  Surgeon: [name], MD;  Location: [facility];  Service: Gastroenterology;  Laterality: N/A;  . ESOPHAGEAL BANDING N/A 05/21/2018   Procedure: ESOPHAGEAL BANDING;  Surgeon: [name], MD;  Location: [facility];  Service: Gastroenterology;  Laterality: N/A;  . ESOPHAGEAL BANDING N/A 10/11/2018   Procedure: ESOPHAGEAL BANDING;  Surgeon: [name], MD;  Location: [facility];  Service: Gastroenterology;  Laterality: N/A;  . ESOPHAGOGASTRODUODENOSCOPY N/A 03/29/2018   Procedure: ESOPHAGOGASTRODUODENOSCOPY (EGD);  Surgeon: [name], MD;  Location: [facility];  Service: Gastroenterology;   Laterality: N/A;  . ESOPHAGOGASTRODUODENOSCOPY N/A 05/21/2018   Procedure: ESOPHAGOGASTRODUODENOSCOPY (EGD);  Surgeon: [name], MD;  Location: [facility];  Service: Gastroenterology;  Laterality: N/A;  . ESOPHAGOGASTRODUODENOSCOPY (EGD) WITH PROPOFOL N/A 02/01/2018   Procedure: ESOPHAGOGASTRODUODENOSCOPY (EGD) WITH PROPOFOL;  Surgeon: [name], MD;  Location[facility];  Service: Gastroenterology;  Laterality: N/A;  . ESOPHAGOGASTRODUODENOSCOPY (EGD) WITH PROPOFOL N/A 10/11/2018   Procedure: ESOPHAGOGASTRODUODENOSCOPY (EGD) WITH PROPOFOL;  Surgeon: [name], MD;  Location: [facility];  Service: Gastroenterology;  Laterality: N/A;  . INCONTINENCE SURGERY    . IR IMAGING GUIDED PORT INSERTION  04/08/2019  . LEEP N/A 03/28/2014   Procedure: LOOP ELECTROSURGICAL EXCISION PROCEDURE (LEEP) cone biopsy;  Surgeon: Cyril Mourning, MD;  Location: [facility];  Service: Gynecology;  Laterality: N/A;  . LEFT HEART CATH AND CORONARY ANGIOGRAPHY N/A 03/23/2017   Procedure: LEFT HEART CATH AND CORONARY ANGIOGRAPHY;  Surgeon: Jettie Booze, MD;  Location: [facility];  Service: Cardiovascular;  Laterality: N/A;  . LIVER BIOPSY     x 2  . RADIAL ARTERY HARVEST Left 03/24/2017   Procedure: RADIAL ARTERY HARVEST;  Surgeon: Ivin Poot, MD;  Location: [facility];  Service: Open Heart Surgery;  Laterality: Left;  . TEE WITHOUT CARDIOVERSION N/A 03/24/2017   Procedure: TRANSESOPHAGEAL ECHOCARDIOGRAM (TEE);  Surgeon: Prescott Gum, Collier Salina, MD;  Location: [facility];  Service: Open Heart Surgery;  Laterality: N/A;  . WISDOM TOOTH EXTRACTION      There were no vitals filed for this visit.    Subjective Assessment - 11/24/19 1335    Subjective pt is a 49 y.o Fwith CC or feeling fatigued with limited stamina and difficulty with weakness in the hands. 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 She reports general weakness globally. she repors soreness from the  stables from the surgery, and soreness and spasm in the back"    Pertinent History CAD, liver tranplants    Limitations Standing;Walking;Sitting    How long can you sit comfortably? 1 hour    How long can you stand comfortably? 15 min    How long can you walk comfortably? 15 min    Patient Stated Goals get back to walking a mile, increase stamina, promote strength and return work.    Currently in Pain? Yes    Pain Score 4     Pain Location Back    Pain Orientation Lower    Pain Descriptors / Indicators Aching;Tightness    Pain Type Surgical pain    Pain Onset More than a month ago    Pain Frequency Intermittent    Aggravating Factors  standing/ walking,    Pain Relieving Factors massage, heat pad              [facility] PT Assessment - 11/24/19 1313      Assessment   Medical Diagnosis Other malaise R53.81, Liver transplant status Z94.4    Referring Provider (PT) Lauralee Evener, Placido Sou, NP  Onset Date/Surgical Date --   liver transplatn 10/18/2019   Hand Dominance Right    Next MD Visit --   with surgeon on 12/05/2019   Prior Therapy yes   cardiac rehab     Precautions   Precaution Comments no lifting over 5lbs, no bending, lifting/ twisting      Restrictions   Weight Bearing Restrictions No      Balance Screen   Has the patient fallen in the past 6 months Yes    How many times? 1   noted while she was confused at hospital   Has the patient had a decrease in activity level because of a fear of falling?  No    Is the patient reluctant to leave their home because of a fear of falling?  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, radiology   Vocation Requirements lifting/ pushing, pulling carrying    Leisure painting, traveling, listen to live music,       Cognition   Overall Cognitive Status Within Functional Limits for tasks assessed      Observation/Other Assessments   Focus on Therapeutic Outcomes (FOTO)  57% limited   predicted 44% limited     Posture/Postural Control   Posture/Postural Control Postural limitations    Postural Limitations Rounded Shoulders;Forward head      ROM / Strength   AROM / PROM / Strength AROM;Strength      AROM   AROM Assessment Site Lumbar      Strength   Strength Assessment Site Hip;Knee;Shoulder    Right/Left Shoulder Right;Left    Right Shoulder Flexion 3+/5    Right Shoulder Extension 3+/5    Right Shoulder ABduction 3+/5    Right Shoulder Internal Rotation 4-/5    Right Shoulder External Rotation 3+/5    Left Shoulder Flexion 3+/5    Left Shoulder Extension 3+/5    Left Shoulder ABduction 3+/5    Left Shoulder Internal Rotation 4-/5    Left Shoulder External Rotation 3/5    Right/Left Hip Right;Left    Right Hip Flexion 4-/5    Right Hip Extension 4-/5    Right Hip ABduction 3+/5    Right Hip ADduction 4/5    Left Hip Flexion 4-/5    Left Hip Extension 4-/5    Left Hip ABduction 3+/5    Left Hip ADduction 4/5    Right/Left Knee Right;Left    Right Knee Flexion 4/5    Right Knee Extension 4/5    Left Knee Flexion 4/5    Left Knee Extension 4/5      Palpation   Palpation comment TTP along bil lumbar parapsinals with spasm noted      Ambulation/Gait   Ambulation/Gait Yes    Gait Pattern Step-through pattern;Decreased stride length;Antalgic;Trunk flexed      Standardized Balance Assessment   Standardized Balance Assessment Timed Up and Go Test;Five Times Sit to  Stand    Five times sit to stand comments  16 seconds    with use of hands     Timed Up and  Go Test   TUG Comments 15 second                      Objective measurements completed on examination: See above findings.               PT Education - 11/24/19 1348    Education Details evaluation findings, POC, goals, HEP with proper form/ rationale.    Person(s) Educated Patient    Methods Explanation;Verbal cues;Handout    Comprehension Verbalized understanding;Verbal cues required            PT Short Term Goals - 11/24/19 1426      PT SHORT TERM GOAL #1   Title pt to be IND with initial HEP    Time 4    Period Weeks    Status New    Target Date 12/22/19      PT SHORT TERM GOAL #2   Title increase bil hip abductor / extensor strength to >/ 3-/5 to promote hip stability    Time 4    Period Weeks    Status New    Target Date 12/22/19      PT SHORT TERM GOAL #3   Title increase 5 x sit to stand  and TUG to </= 13 seconds to work toward functional / safe transitions    Time 4    Period Weeks    Status New    Target Date 12/22/19             PT Long Term Goals - 11/24/19 1428      PT LONG TERM GOAL #1   Title increase bil LE gross strength to 4+/5 to promtoe hip/ knee stability and promote efficient posture    Time 8    Period Weeks    Status New    Target Date 01/19/20      PT LONG TERM GOAL #2   Title increase bil Gross UE strength to >/= 4+/5 to assist with functional lifting/ carrying / pushing and pull as it relates to potential work related tasks.    Time 8    Period Weeks    Status New    Target Date 01/19/20      PT LONG TERM GOAL #3   Title improve 5 x STS to </= 12 seconds and TUG to </= 10 seconds to for functiona/ efficent and safe mobility    Time 8    Period Weeks    Status New    Target Date 01/19/20      PT LONG TERM GOAL #4   Title pt to be able to stand and walk for >/= 1 hour for functional endurnace required for work tasks and community ambulation with no report of pain    Time 8    Period Weeks     Status New    Target Date 01/19/20      PT LONG TERM GOAL #5   Title increase FOTO score to </=44% limited to demo improvement in function    Time 8    Period Weeks    Status New    Target Date 01/19/20      Additional Long Term Goals   Additional Long Term Goals Yes      PT LONG TERM GOAL #6   Title pt  to be IND with all HEP to maintain and progress current LOF IND    Time 8    Period Weeks    Status New    Target Date 01/19/20                  Plan - 11/24/19 1328    Clinical Impression Statement pt is a 50 y.o F s/p liver transplant on 10/18/2019 with dx of malaise. She has gross weakness noted in bil UE/LE with no pain during testing. TTP located along thoracolumbar parapsinals with spasm noted. She scored 16 seconds on 5 x sit to stand yusing her hands to assist with stand and 15 seconds with the TUG. She would benefit from physical therapy to decrease low back pain, improve gross strength/ endurnace, improve gait efficiency, and maximize her function by addressing the deficts listed.    Personal Factors and Comorbidities Comorbidity 1    Comorbidities hx of liver transplant, anemia, significant PMHx/ surgical hx    Stability/Clinical Decision Making Evolving/Moderate complexity    Clinical Decision Making Moderate    Rehab Potential Good    PT Frequency 2x / week    PT Duration 8 weeks    PT Treatment/Interventions ADLs/Self Care Home Management;Moist Heat;Cryotherapy;Gait training;Stair training;Functional mobility training;Therapeutic activities;Therapeutic exercise;Balance training;Neuromuscular re-education;Manual techniques;Passive range of motion;Taping;Patient/family education    PT Next Visit Plan unable to lay supine due to sutures, reviewe/ update HEP, review FOTO, gross LE/ UE strengthening,   STW for low back (can try massage chair), gentle core strengthening,    PT Home Exercise Plan [phone] - low back stretch (seated walking hands on chair), sit to stand  with hands on knees, lower trunk rotations, sidelying hip abduction, gripping, finger extnesion with putty/ rubber band.    Consulted and Agree with Plan of Care Patient           Patient will benefit from skilled therapeutic intervention in order to improve the following deficits and impairments:  Abnormal gait, Improper body mechanics, Postural dysfunction, Increased muscle spasms, Decreased strength, Pain, Decreased activity tolerance, Decreased balance, Decreased endurance  Visit Diagnosis: Muscle weakness (generalized)  Other abnormalities of gait and mobility  Abnormal posture  Unsteadiness on feet  Muscle spasm of back     Problem List Patient Active Problem List   Diagnosis Date Noted  . Arthritis   . CAD (coronary artery disease)   . PONV (postoperative nausea and vomiting)   . SVD (spontaneous vaginal delivery)   . Urinary tract bacterial infections   . Other insomnia 09/05/2019  . 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 (HFpEF) heart failure with preserved ejection fraction ([place]) 08/09/2019  . High grade squamous intraepithelial lesion (HGSIL) on cytologic smear of cervix 05/04/2019  . Familial hyperlipidemia 03/30/2019  . H/O two vessel coronary artery bypass graft 03/30/2019  . H/O LEEP 03/30/2019  . Hypo-osmolality and hyponatremia 03/30/2019  . Atypical squamous cells of undetermined significance (ASCUS) on Papanicolaou smear of cervix 08/05/2018  . Upper GI bleed 02/01/2018  . GI bleed 02/01/2018  . Ascites--mild this admit 11/06/2017  . Atypical chest pain 11/05/2017  . Dyslipidemia, goal LDL below 70 10/13/2017  . Acute blood loss anemia 04/20/2017  . Elevated LFTs 04/20/2017  . Coronary artery disease 03/24/2017  . S/P CABG x 2 03/24/2017  . CAD (coronary artery disease), native coronary artery 03/23/2017  . Cirrhosis ([place]) 03/16/2017  . Primary biliary cholangitis ([place]) 07/26/2012  . GERD (gastroesophageal reflux disease) 01/28/2012  . Primary  biliary cirrhosis (  [place]) 01/28/2012  . IBS (irritable bowel syndrome) 01/28/2012   Starr Lake PT, DPT, LAT, ATC  11/24/19  2:33 PM      [facility] [address] [place], [place], [address] Phone: [phone]   Fax:  [phone]  Name: TAYLER HEIDEN MRN: [id number] Date of Birth: 10/01/1970

## 2019-11-25 DIAGNOSIS — Z944 Liver transplant status: Principal | ICD-10-CM

## 2019-11-25 DIAGNOSIS — Z298 Encounter for other specified prophylactic measures: Principal | ICD-10-CM

## 2019-11-25 LAB — CBC W/ DIFFERENTIAL
BANDED NEUTROPHILS ABSOLUTE COUNT: 0 10*3/uL (ref 0.0–0.1)
BASOPHILS ABSOLUTE COUNT: 0 10*3/uL (ref 0.0–0.2)
BASOPHILS RELATIVE PERCENT: 1 %
EOSINOPHILS RELATIVE PERCENT: 2 %
HEMATOCRIT: 24.8 % — ABNORMAL LOW (ref 34.0–46.6)
HEMOGLOBIN: 8.8 g/dL — ABNORMAL LOW (ref 11.1–15.9)
IMMATURE GRANULOCYTES: 1 %
LYMPHOCYTES RELATIVE PERCENT: 12 %
MEAN CORPUSCULAR HEMOGLOBIN CONC: 35.5 g/dL (ref 31.5–35.7)
MEAN CORPUSCULAR HEMOGLOBIN: 34.2 pg — ABNORMAL HIGH (ref 26.6–33.0)
MEAN CORPUSCULAR VOLUME: 97 fL (ref 79–97)
MONOCYTES ABSOLUTE COUNT: 0.3 10*3/uL (ref 0.1–0.9)
MONOCYTES RELATIVE PERCENT: 6 %
NEUTROPHILS ABSOLUTE COUNT: 4.1 10*3/uL (ref 1.4–7.0)
NEUTROPHILS RELATIVE PERCENT: 78 %
PLATELET COUNT: 99 10*3/uL — CL (ref 150–450)
RED BLOOD CELL COUNT: 2.57 x10E6/uL — CL (ref 3.77–5.28)
RED CELL DISTRIBUTION WIDTH: 17.7 % — ABNORMAL HIGH (ref 11.7–15.4)
WHITE BLOOD CELL COUNT: 5.2 10*3/uL (ref 3.4–10.8)

## 2019-11-25 LAB — COMPREHENSIVE METABOLIC PANEL
A/G RATIO: 2.1 (ref 1.2–2.2)
ALBUMIN: 4.2 g/dL (ref 3.8–4.8)
ALT (SGPT): 10 IU/L (ref 0–32)
AST (SGOT): 16 IU/L (ref 0–40)
BILIRUBIN TOTAL: 0.7 mg/dL (ref 0.0–1.2)
BLOOD UREA NITROGEN: 17 mg/dL (ref 6–24)
BUN / CREAT RATIO: 11 (ref 9–23)
CALCIUM: 8.7 mg/dL (ref 8.7–10.2)
CHLORIDE: 108 mmol/L — ABNORMAL HIGH (ref 96–106)
CO2: 16 mmol/L — ABNORMAL LOW (ref 20–29)
CREATININE: 1.56 mg/dL — ABNORMAL HIGH (ref 0.57–1.00)
GLOBULIN, TOTAL: 2 g/dL (ref 1.5–4.5)
GLUCOSE: 100 mg/dL — ABNORMAL HIGH (ref 65–99)
POTASSIUM: 4.4 mmol/L (ref 3.5–5.2)
SODIUM: 140 mmol/L (ref 134–144)
TOTAL PROTEIN: 6.2 g/dL (ref 6.0–8.5)

## 2019-11-25 LAB — PHOSPHORUS, SERUM: Phosphate:MCnc:Pt:Ser/Plas:Qn:: 3.7

## 2019-11-25 LAB — MAGNESIUM: Magnesium:MCnc:Pt:Ser/Plas:Qn:: 1.5 — ABNORMAL LOW

## 2019-11-25 LAB — EOSINOPHILS RELATIVE PERCENT: Eosinophils/100 leukocytes:NFr:Pt:Bld:Qn:Automated count: 2

## 2019-11-25 LAB — BLOOD UREA NITROGEN: Urea nitrogen:MCnc:Pt:Ser/Plas:Qn:: 17

## 2019-11-25 LAB — BILIRUBIN DIRECT: Bilirubin.glucuronidated+Bilirubin.albumin bound:MCnc:Pt:Ser/Plas:Qn:: 0.3

## 2019-11-25 LAB — GAMMA GLUTAMYL TRANSFERASE: Gamma glutamyl transferase:CCnc:Pt:Ser/Plas:Qn:: 35

## 2019-11-25 MED ORDER — ATOVAQUONE 750 MG/5 ML ORAL SUSPENSION
Freq: Every day | ORAL | 5 refills | 30 days | Status: CP
Start: 2019-11-25 — End: 2019-12-25

## 2019-11-25 MED FILL — ERGOCALCIFEROL (VITAMIN D2) 1,250 MCG (50,000 UNIT) CAPSULE: 28 days supply | Qty: 4 | Fill #0 | Status: AC

## 2019-11-25 NOTE — Unmapped (Signed)
Gertie Fey, NP returned from offsite clinic and signed the PT plan of Care and successfully faxed the forms to [facility] outpatient rehab on [facility] this afternoon.   Fax: [phone]  Phone: [phone]      Received message back from patient about the atovaquone:  ----- Message -----       From:Kathy Armstrong       Sent:11/25/2019 12:51 PM EDT         [id number] Kathy Armstrong    Subject:RE: Labs are stable    Thank you for the encouragement!! So is this new med a pill? I would be anxious about adding a new pill with 0possible side effects . I don't want anything to slow down my efforts .    Sent message back to patient:  This is the medication that the inpatient coordinator discussed with you as she said you would prefer the pill form medication. Otherwise it would be coming once a [facility] to [facility] to receive albuterol and then the pentamidine inhalation in a room.   This is not to say this will upset your stomach as she was holding off initially from adding another medication until you improved.   Either way, you would either need a new medication that would require a monthly trip in the clinic room to receive albuterol followed by inhaling the penatmidine or taking atovaquone daily that is a 10mL only liquid if insurance approves or if assistance is available.   Kim    Received message back from patient about the donor letter:  --- Message -----       From:Kathy Armstrong       Sent:11/25/2019  1:17 PM EDT         EX:[id number] Kathy Armstrong    Subject:RE: Forwarded your donor letter on to [facility], Thank you so much ! It took me forever to write it . I'm glad it got to the right [name] . I filled up the envelope with specific identifiers for you LOL!    Happy Friday !   [name]     Sent message back  Yes, it made it to me and if the donor family is ready to receive it, I am sure they appreciate the time you took to write it and the words shared with them.  Take care,  [name]

## 2019-11-25 NOTE — Unmapped (Addendum)
Sent message to patient in regards to her 9/30 labs:  Hi Kathy Armstrong.    Your 11/24/19 labs are stable and the liver labs are looking good. The magnesium level looks better. The creatinine/kidney function increased some to 1.56; keep hydrating well.     Have you seen any improvement yet from decreasing the myfortic to 180mg  twice a day yet with the frequency of your loose stools?    Has increasing the omeprazole to 40mg  daily helped the feeling in your stomach?    Thanks,  [name]    Received message back from patient:  ----- Message -----       From:Kathy Armstrong       Sent:11/25/2019  9:07 AM EDT         [id number] Karsten Ro    Subject:RE: Labs are stable    [name],  I'm glad to hear my labs are stable . I feel like I'm drinking more so that's disappointing with my kidney function. I'm trying on the hydration and able to drink more . The diarrhea is still the same but the amount of time i go has lessened . Like 3 times a day . Which I'm sure doesn't help my hydration efforts .    I don't have that gnawing feeling anymore after the increase in omeprAzole. I am able to eat more thjngs and not gagging as much . I focus on protein based things . The issue is I'm still not able to eat a lot at meal time . My weight is continually going down each day . I'm at 130 currently . It's a bit worrisome for me . Is there a type of infusion I could get outpt through my port like a TPN type thing until I can eat more at meal time ? I know most of those things take many hours to complete but wasn't sure If there were other options . I'm not going to do any type of tube down my nose . That didn't help my cause being able to eat :). I feel like I'm progressing so much since discharge but it's still not enough to stop loosing the weight .     Kathy Armstrong     Sent message to Cecilie Lowers, PharmD with some of the information about in regards to the increase in omeprazole working to resolve the gnawing feeling in her stomach and able to eat more things. The plan was to possibly do pentamidine mid-October until this resolved before trying atovaquone. Since the omeprazole increase resolved this, have asked if want to try Atovaquone instead of pentamidine or proceed with pentamidine.   Heard back from Vernona Rieger just before sending message to patient that we can see if Atovaquone 1500mg  daily is covered. Escripted to [facility] pharmacy and included in note to patient.     Sent patient a message back:  Thanks for writing back.   I am glad that you are drinking more and it will be interesting to see your prograf/tacrolimus level when it results (will see it on Monday) as it might have increased again. Having diarrhea does make it tough to keep up with hydration but hopefully this will continue to improve with the myfortic decrease since it sounds like the amount of times has lessened.     This is great that the increase in omeprazole has resolved the gnawing feeling and you are able to eat more things. Due to this, I reached out to Vernona Rieger, the pharmacist, to see if we  can try the Atovaquone for you instead of the pentamidine. When you were having the gnawing feeling, she wanted to hold off and do the pentamidine mid October. I will let you know.   *Prior to me sending this to you, Mamie Nick back saying we can try the Atovaquone if it is covered by insurance and if not to see if assistance is available for it.     TPN is a last resort in general for people who cannot tolerate any foods in their GI system at all and under different circumstances.   Thankfully you met with Katie, dietitian, and continue working on eating and incorporating the foods she mentioned. This takes time and many people take awhile before they are able to have their appetites return. Take this one day at a time. Remember it took you awhile to get to the point of having a transplant and it will take time for your body to recover and get used to these new medications, the liver settling in and begin to gain muscle.   Usually months down the road, we are saying the opposite for people to pull back on the amount they are eating because they start to overeat and gain weight as they continue to recover and heal from their surgery.     I hope this weekend goes well for you and we will see your tacrolimus on Monday and your labs next week.     We received physical therapy orders today for Kathy Armstrong to sign and we will fax these on Monday since Kathy Armstrong had to go to an offsite clinic this morning; so she is aware they are on her desk to sign when she returns on Monday.     Take care,  [name]

## 2019-11-25 NOTE — Unmapped (Signed)
Addended by: Nigel Bridgeman on: 11/25/2019 12:08 PM     Modules accepted: Orders

## 2019-11-25 NOTE — Unmapped (Signed)
Received pt's letter to her donor. Completed the slip with the UNOS ID showing in the transplant tab and mailed her donor letter and this slip of paper to [facility].     Sent message to patient:  Hi.     I received the donor letter in the mail today. I filled out the slip of paper with the UNOS ID and the organ transplanted as liver and mailed this to [facility] today.     This was nice of you to send a letter.     Take care,  Kathy Armstrong

## 2019-11-26 LAB — TACROLIMUS BLOOD: Tacrolimus:MCnc:Pt:Bld:Qn:LC/MS/MS: 20.4

## 2019-11-26 NOTE — Unmapped (Signed)
Received call from pt regarding 9/30 supra therapeutic tac level she saw in her MyChart. Pt denies taking meds before getting labs. She denies consumption of grapefruit containing foods. Reviewed level with Dr. Celine Mans. Recommendation was to hold doses until Monday morning. Pt will continue taking Prednisone and Myfortic. Called pt and relayed plan. Asked her to repeat labs on Monday morning and contact primary coordinator regarding future dosing. Pt verbalized understanding.

## 2019-11-28 ENCOUNTER — Other Ambulatory Visit: Payer: Self-pay

## 2019-11-28 ENCOUNTER — Encounter: Payer: Self-pay | Admitting: Physical Therapy

## 2019-11-28 ENCOUNTER — Ambulatory Visit: Payer: 59 | Attending: Family Medicine | Admitting: Physical Therapy

## 2019-11-28 ENCOUNTER — Ambulatory Visit
Admit: 2019-11-28 | Discharge: 2019-11-29 | Payer: PRIVATE HEALTH INSURANCE | Attending: Physician Assistant | Primary: Physician Assistant

## 2019-11-28 DIAGNOSIS — Z944 Liver transplant status: Principal | ICD-10-CM

## 2019-11-28 DIAGNOSIS — R509 Fever, unspecified: Principal | ICD-10-CM

## 2019-11-28 DIAGNOSIS — Z79899 Other long term (current) drug therapy: Principal | ICD-10-CM

## 2019-11-28 DIAGNOSIS — Z298 Encounter for other specified prophylactic measures: Principal | ICD-10-CM

## 2019-11-28 DIAGNOSIS — M62830 Muscle spasm of back: Secondary | ICD-10-CM | POA: Diagnosis not present

## 2019-11-28 DIAGNOSIS — R2681 Unsteadiness on feet: Secondary | ICD-10-CM | POA: Diagnosis not present

## 2019-11-28 DIAGNOSIS — R293 Abnormal posture: Secondary | ICD-10-CM | POA: Insufficient documentation

## 2019-11-28 DIAGNOSIS — R2689 Other abnormalities of gait and mobility: Secondary | ICD-10-CM | POA: Diagnosis not present

## 2019-11-28 DIAGNOSIS — M6281 Muscle weakness (generalized): Secondary | ICD-10-CM | POA: Diagnosis not present

## 2019-11-28 DIAGNOSIS — Z20822 Contact with and (suspected) exposure to covid-19: Secondary | ICD-10-CM | POA: Diagnosis not present

## 2019-11-28 MED ORDER — ATOVAQUONE 750 MG/5 ML ORAL SUSPENSION
5 refills | 0 days
Start: 2019-11-28 — End: ?

## 2019-11-28 MED ORDER — TACROLIMUS 1 MG CAPSULE, IMMEDIATE-RELEASE
ORAL_CAPSULE | 11 refills | 0 days | Status: CP
Start: 2019-11-28 — End: ?

## 2019-11-28 NOTE — Unmapped (Signed)
Assessment     Kathy Armstrong is a 48 y.o. female presenting to [facility] for COVID testing.     Problem List Items Addressed This Visit     None      Visit Diagnoses     Contact with and (suspected) exposure to covid-19    -  Primary    Fever of unknown origin        Relevant Orders    Respiratory Pathogen Panel with COVID-19    History of liver transplant (CMS-HCC)        Relevant Orders    Respiratory Pathogen Panel with COVID-19          Plan     If no testing performed, pt counseled on routine care for respiratory illness.  If testing performed, COVID sent.  Patient directed to Home given findings during today's visit.    Subjective     Kathy Armstrong is a 49 y.o. female who [facility] with complaints of the following:    Exposure History: In the last 21 days?     Have you traveled outside of [place]? No               Have you been in close contact with someone confirmed by a test to have COVID? (Close contact is within 6 feet for at least 10 minutes) No       Have you worked in a health care facility? No     Lived or worked facility like a nursing home, group home, or assisted living?    No         Are you scheduled to have surgery or a procedure in the next 3 days? No               Are you scheduled to receive cancer chemotherapy within the next 7 days?    No     Have you ever been tested before for COVID-19 with a swab of your nose? Yes: When: 10/17/19, Where:  hosp   Are you a healthcare worker being tested so to return to work No         Right now,  do you have any of the following that developed over the past 7 days (as stated by patient on intake form):    Subjective fever (felt feverish) Yes, how many days? 2   Chills (especially repeated shaking chills) Yes, how many days? 1   Severe fatigue (felt very tired) Yes, how many days? 1   Muscle aches Yes, how many days? 2   Runny nose Yes, how many days? 5   Sore throat no   Loss of taste or smell No   Cough (new onset or worsening of chronic cough) No   Shortness of breath No   Nausea or vomiting No   Headache Yes, how many days? 5   Abdominal Pain No   Diarrhea (3 or more loose stools in last 24 hours) Yes, how many days? since hosp stay on 8/24     History/Medical Conditions (as stated by patient on intake form):    Do you have any of the following:   Asthma or emphysema or COPD No   Cystic Fibrosis No   Diabetes No   High Blood Pressure  Yes   Cardiovascular Disease Yes   Chronic Kidney Disease No   Chronic Liver Disease Yes   Chronic blood disorder like Sickle Cell Disease  No  Weak immune system due to disease or medication Yes   Neurologic condition that limits movement  No   Developmental delay - Moderate to Severe  No   Recent (within past 2 weeks) or current Pregnancy No   Morbid Obesity (>100 pounds over ideal weight) No   Current Smoker No   Former Smoker No       Objective     Given above, testing performed: Yes    Testing Performed:  Test Specimen Type Sent to   COVID-19  NP Swab [employer]       Scribe's Attestation: [place], [place] obtained and performed the history, physical exam and medical decision making elements that were entered into the chart.  Signed by Ballard Russell, CMA serving as Scribe, on 11/28/2019 3:56 PM  The documentation recorded by the scribe accurately reflects the service I personally performed and the decisions made by me. Jay Kempe F. Latanya Maudlin  November 29, 2019 8:20 AM

## 2019-11-28 NOTE — Unmapped (Signed)
Patient left VM that [facility] would not do the respiratory pathogen panel with COVID. Talked with txp clinic/RDC manager, Trey Paula, who confirmed this would have to be done at [facility] or some urgent cares are doing them. He mentioned patient can schedule via MyChart portal or he could schedule it.   Talked with patient and she is able to get here before 3:30pm. Trey Paula scheduled her and let patient know that it was scheduled for 3:15pm and to get here before 3:30pm today. Aware the appointment with the address on [address] is in [place] to use her GPS.   Jeff placed appointment notes for them to do the RPP (respiratory pathogen panel with COVID) and patient can let them know too when she arrives.

## 2019-11-28 NOTE — Unmapped (Signed)
Received message back from patient:  Kathy Armstrong, Kleman  Phone Number: [phone]   [place] Faster !     Yes, I didn't ??think I though that through . I'm so so sorry for the back and forth on this. I do think making the trip would be better even though it's prob ??more of a mental thing . I just don't want anything to set me back though . My whole GI tract is a Diva I swear ! I'm sorry you put in the time to look into this for me . I feel so bad :/ You have been so awesome to work with . I'm glad I got you as my coordinator.     I hope you have a great weekend !   [name]       Talked with on call coordinator this morning who mentioned pt's 9/30 tac resulted as 20.4 and Dr. Celine Mans held tac doses until this morning.     Reviewed past changes and reviewed past tac levels and the 9/30 tac with Cecilie Lowers, PharmD who mentioned for patient to start tacrolimus 5mg  this morning and 4mg  at night.     Talked with patient letting her know that we reviewed her tac level and she verbalized understanding to take tac 5mg  this morning and 4mg  tonight and to continue with this. She confirmed she has hydrated well this weekend. She also already got labs at [facility] this morning (of note: today's tac level will reflect holding Saturday night, Sunday morning and Sunday night; it will not reflect the tac dose change made this morning).   Patient confirmed she continues with diarrhea 3x a day and it had lessened last week with the myfortic decrease.     She confirmed she would prefer doing the 1x a month pentamidine instead of taking another oral medication (atovaquone); mentioned that this is fine and to be aware that she may need to come to [facility] a couple of times a month with clinic schedule and then pentamidine since pentamidine can only be Tuesday through Friday after 2pm. Verbalized understanding after the pentamidine is done that she would need to schedule her next month's pentamidine before she leaves the clinic. Patient already has pentamidine therapy plan entered for October.   Called [facility] pharmacy letting them know to hold off on the pentamidine order and staff was making a note not to fill for now but holding in case patient changes to wanting to try the atovaquone.      Patient then c/o fever 100.8 and chills yesterday, today 100.1, headache and cold like symptoms; both temperatures are without tylenol. No one in her household has fevers and says daughter has allergies. She continues with urinary incontinence since surgery though improving at home.     As seen with next telephone encounter (copy of order in note and sent to pt's MyChart), reviewed the above with Geralyn Corwin, PA  entered [facility] order for Respiratory pathogen panel with COVID 19. Mentioned we will see results but if patient continues for another day with fever of 100.4 or greater she may need to be admitted.   In terms of the urinary incontinence, she may need referral for pelvic PT or Bladder retraining and can discuss in clinic next week since patient reports this is improving.     Talked with patient letting her know the need to return to [facility] to have respiratory pathogen panel with COVID 19 done today due to the symptoms  she is reporting to rule out any infections; mentioned if she has fever of 100.4 or more tomorrow that she may need to be brought in.   Aware one time lab order copy was sent to her MyChart for today's order in case [facility] cannot find it.  Discussed that Candise Bowens mentioned that she may need pelvic PT or bladder retraining and can discuss further in clinic since she is reporting improvement.

## 2019-11-28 NOTE — Unmapped (Signed)
Discussed with Leslye Peer, PA that patient c/o fever 100.8 and chills yesterday, today 100.1 without tylenol, headache and cold like symptoms.     Per Candise Bowens, patient to do respiratory pathogen panel with COVID 19 and completed with [facility] in computer.     Also mentioned that patient had urinary incontinence since the txp surgery and urinalysis was done in patient and showed the 9/9 result to [facility]; mentioned patient reports this as being better. Candise Bowens mentioned if needed, patient can have pelvic PT/bladder retraining and can discuss further when patient in clinic next week.     Sent copy to pt's MyChart.   Label Here    [facility]   COR EDI   [facility]             ??   Jeani Sow EDI - [facility] Page 1 of 1   Account #: [id number] Collection Date: ??   ?? ?? ??   Req/Control #: [id number] Collection Time: ??         Client / Ordering Site Information: Physician Information:   Account Name: [facility] [place]   Address 1: [address]   Address 2: ??   [address] [place] Zip: [place], [place] [address]   Phone: [phone]    Ordering: Drake Leach   Degree: PA   NPI: [id number]   UPIN: ??   Physician ID: ??         Patient Information:   Name: Gunby,Haroldine   Gender: Female   Date of Birth: 07-14-1970   Age: 3 years   Address: [address]   [place], [place] Zip: [place], [place] [address]    SSN: [id number]   Patient ID: [id number]   Phone: [phone]       Alt Control#: [id number]   Alt Patient ID: ??            Comments:         Order Code Tests Ordered (Total: 1)    Order Code Tests Ordered      [id number] Respiratory Pathogen Panel with COVID-19                Specimen Source:   ([id number]) Respiratory Pathogen Panel with COVID-19: ?? Nasopharyngeal            Diagnosis Codes:   R50.9 Z94.4 ?? ?? ?? ?? ?? ??   Bill Type: Third-Party    Carrier Code: ??        ??      Responsible Party / Guarantor Information:   Name: ??   Address: ??   [place], [place] Zip: ??   Phone: ??   Relation to Pt: ??   Employer Name: ABN: ??    Worker's Comp: ?? Date of Injury: ??            Insurance Information:   Primary Insurance: Secondary Insurance:   Carrier Code: Not on file   Ins Co Name: ??   Address 1: ??   Address 2: ??   [place], [place] Zip: ??   Policy Number: ??   Group #: ??    Carrier Code: ??   Ins Co Name: ??   Address 1: ??   Address 2: ??   [place], [place] Zip: ??   Policy Number: ??   Group #: ??      Primary Policy Holder / Insured: Astronomer / Insured:   Name: ??   Address: ??    ??  Pt Relation to Subscriber: ??    Name: ??   Address: ??    ??   Pt Relation to Subscriber: ??         Authorization - Please Sign and Date  I hereby authorize the release of medical information related to the services described hereon and authorize payment directly to [employer] [place].  ??  ??  __________________________________                    ______________  Patient Signature                                                                    Date  ??  ??  __________________________________                     ______________  Physician Signature                                                                Date   ??   ?? Fessenden,Mahayla   08-09-1970 [id number]   MRN: [id number]   Coll Dt/Time: ??-     Bartling,Briseidy   03/01/1970 [id number]   MRN: [id number]   Coll Dt/Time: ??-     Mcaffee,Aminata   [birth year] [id number]   MRN: [id number]   Coll Dt/Time: ??-     Bachar,Kinslee   Oct 25, 1970 [id number]   MRN: [id number]   Coll Dt/Time: ??-       Mcwherter,Mariann   [birth year] [id number]   MRN: [id number]   Coll Dt/Time: ??-     Gubser,Ariana   02-08-1971 [id number]   MRN: [id number]   Coll Dt/Time: ??-     Tash,Reannon   [birth year] [id number]   MRN: [id number]   Coll Dt/Time: ??-     Zahradnik,Tzipora   [birth year] [id number]   MRN: [id number]   Coll Dt/Time: ??-

## 2019-11-28 NOTE — Therapy (Signed)
[place], [place], [address] Phone: [phone]   Fax:  [phone]  Physical Therapy Treatment  Patient Details  Name: Christine Butler MRN: [id number] Date of Birth: March 23, 1970 Referring Provider (PT): Benna Dunks, NP    Encounter Date: 11/28/2019   PT End of Session - 11/28/19 1151    Visit Number 2    Number of Visits 17    Date for PT Re-Evaluation 01/19/20    Authorization Type [facility] UMR    PT Start Time 1147    PT Stop Time 1228    PT Time Calculation (min) 41 min    Activity Tolerance Patient tolerated treatment well    Behavior During Therapy [facility] for tasks assessed/performed           Past Medical History:  Diagnosis Date  . (HFpEF) heart failure with preserved ejection fraction ([place]) 08/09/2019  . Acute blood loss anemia 04/20/2017  . Arthritis    right knee  . Ascites--mild this admit 11/06/2017   per patient , now stable with meds and lifestlye adjustment  . Atypical chest pain 11/05/2017  . Atypical squamous cells of undetermined significance (ASCUS) on Papanicolaou smear of cervix 08/05/2018  . CAD (coronary artery disease)    a. s/p CABGx2 in 02/2017 (LAD not suitable for PCI), EF normal.  . CAD (coronary artery disease), native coronary artery 03/23/2017  . 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 Cirrhosis ([place]) 03/16/2017  . Coronary artery disease 03/24/2017  . Dyslipidemia, goal LDL below 70 10/13/2017  . Elevated LFTs 04/20/2017  . Familial hyperlipidemia   . GERD (gastroesophageal reflux disease)   . GI bleed 02/01/2018  . H/O LEEP 03/30/2019  . H/O two vessel coronary artery bypass graft 03/30/2019  . High grade squamous intraepithelial lesion (HGSIL) on cytologic smear of cervix 05/04/2019  . Hypo-osmolality and hyponatremia 03/30/2019  . IBS (irritable bowel syndrome)   . Other insomnia 09/05/2019  . PONV (postoperative nausea and vomiting)    i always throw up on waking up , but  last EGD in march had no issues    . 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 IMOUPDATE  . Primary biliary cirrhosis (H[facility])    cirhosis/liver disease followed by transplant team led by Dr Manuella Ghazi at [facility]   . S/P CABG (coronary artery bypass graft)   . S/P CABG x 2 03/24/2017   LIMA to DIAGONAL Portion of SVG/LEFT RADIAL to LAD  . SVD (spontaneous vaginal delivery)    x 3  . Upper GI bleed 02/01/2018  . Urinary tract bacterial infections     Past Surgical History:  Procedure Laterality Date  . CORONARY ARTERY BYPASS GRAFT N/A 03/24/2017   Procedure: CORONARY ARTERY BYPASS GRAFTING (CABG) x two, using left internal mammary artery, left radial artery, and right leg greater saphenous vein harvested endoscopically;  Surgeon: Ivin Poot, MD;  Location: [place];  Service: Open Heart Surgery;  Laterality: N/A;  . ENDOVEIN HARVEST OF GREATER SAPHENOUS VEIN Right 03/24/2017   Procedure: ENDOVEIN HARVEST OF GREATER SAPHENOUS VEIN;  Surgeon: Ivin Poot, MD;  Location: [place];  Service: Open Heart Surgery;  Laterality: Right;  . ESOPHAGEAL BANDING  02/01/2018   Procedure: ESOPHAGEAL BANDING;  Surgeon: [name], MD;  Location: [facility];  Service: Gastroenterology;;  . ESOPHAGEAL BANDING N/A 03/29/2018   Procedure: ESOPHAGEAL BANDING;  Surgeon: [name], MD;  Location: [facility];  Service: Gastroenterology;  Laterality: N/A;  . ESOPHAGEAL BANDING N/A 05/21/2018   Procedure: ESOPHAGEAL BANDING;  Surgeon: [name], MD;  Location: [facility];  Service: Gastroenterology;  Laterality: N/A;  . ESOPHAGEAL BANDING N/A 10/11/2018   Procedure: ESOPHAGEAL BANDING;  Surgeon: [name], MD;  Location: [facility];  Service: Gastroenterology;  Laterality: N/A;  . ESOPHAGOGASTRODUODENOSCOPY N/A 03/29/2018   Procedure: ESOPHAGOGASTRODUODENOSCOPY (EGD);  Surgeon: [name], MD;  Location: [facility];  Service: Gastroenterology;   Laterality: N/A;  . ESOPHAGOGASTRODUODENOSCOPY N/A 05/21/2018   Procedure: ESOPHAGOGASTRODUODENOSCOPY (EGD);  Surgeon: [name], MD;  Location: [facility];  Service: Gastroenterology;  Laterality: N/A;  . ESOPHAGOGASTRODUODENOSCOPY (EGD) WITH PROPOFOL N/A 02/01/2018   Procedure: ESOPHAGOGASTRODUODENOSCOPY (EGD) WITH PROPOFOL;  Surgeon: [name], MD;  Location: [facility];  Service: Gastroenterology;  Laterality: N/A;  . ESOPHAGOGASTRODUODENOSCOPY (EGD) WITH PROPOFOL N/A 10/11/2018   Procedure: ESOPHAGOGASTRODUODENOSCOPY (EGD) WITH PROPOFOL;  Surgeon: [name], MD;  Location: [facility];  Service: Gastroenterology;  Laterality: N/A;  . INCONTINENCE SURGERY    . IR IMAGING GUIDED PORT INSERTION  04/08/2019  . LEEP N/A 03/28/2014   Procedure: LOOP ELECTROSURGICAL EXCISION PROCEDURE (LEEP) cone biopsy;  Surgeon: Cyril Mourning, MD;  Location: [facility];  Service: Gynecology;  Laterality: N/A;  . LEFT HEART CATH AND CORONARY ANGIOGRAPHY N/A 03/23/2017   Procedure: LEFT HEART CATH AND CORONARY ANGIOGRAPHY;  Surgeon: Jettie Booze, MD;  Location: [place] [facility];  Service: Cardiovascular;  Laterality: N/A;  . LIVER BIOPSY     x 2  . RADIAL ARTERY HARVEST Left 03/24/2017   Procedure: RADIAL ARTERY HARVEST;  Surgeon: Ivin Poot, MD;  Location: [place];  Service: Open Heart Surgery;  Laterality: Left;  . TEE WITHOUT CARDIOVERSION N/A 03/24/2017   Procedure: TRANSESOPHAGEAL ECHOCARDIOGRAM (TEE);  Surgeon: [place] Gum, Collier Salina, MD;  Location: [place];  Service: Open Heart Surgery;  Laterality: N/A;  . WISDOM TOOTH EXTRACTION      There were no vitals filed for this visit.   Subjective Assessment - 11/28/19 1151    Subjective "I am doing pretty good, the exercises are going pretty good."    Patient Stated Goals get back to walking a mile, increase stamina, promote strength and return work.    Currently in Pain? No/denies                             [facility] Adult PT  Treatment/Exercise - 11/28/19 0001      Exercises   Exercises Knee/Hip      Knee/Hip Exercises: Aerobic   Nustep L4 x 5 min LE only    avoided UE for now until staples are removed     Knee/Hip Exercises: Standing   Hip Flexion Stengthening;Both;1 set;10 reps   with 1 HHA for stability,   Hip Flexion Limitations focusing on controlled lowering    Hip Abduction Stengthening;Both;1 set;10 reps;Knee straight   bil HHA on chair for support     Knee/Hip Exercises: Seated   Long Arc Quad Strengthening;Both;2 sets;10 reps;Weights   with ball squeeze to promote VMO activation   Long Arc Quad Weight 3 lbs.    Marching Strengthening;Both;2 sets;20 reps   alternating L/R    Marching Limitations demonstration to avoid leaning back    Marching Weights 3 lbs.    Sit to Sand 2 sets;10 reps   2nd set focusing on 4 second eccentric loading     Manual Therapy   Manual Therapy Soft  tissue mobilization    Manual therapy comments MTPR along R lumbar paraspinals x 3   with pt in massage chair   Soft tissue mobilization IASTM along R lumbar paraspinals    with pt in massage chair                 PT Education - 11/28/19 1218    Education Details reviewed HEP and FOTO assessment.    Person(s) Educated Patient    Methods Explanation    Comprehension Verbalized understanding            PT Short Term Goals - 11/24/19 1426      PT SHORT TERM GOAL #1   Title pt to be IND with initial HEP    Time 4    Period Weeks    Status New    Target Date 12/22/19      PT SHORT TERM GOAL #2   Title increase bil hip abductor / extensor strength to >/ 3-/5 to promote hip stability    Time 4    Period Weeks    Status New    Target Date 12/22/19      PT SHORT TERM GOAL #3   Title increase 5 x sit to stand  and TUG to </= 13 seconds to work toward functional / safe transitions    Time 4    Period Weeks    Status New    Target Date 12/22/19             PT Long Term Goals - 11/24/19 1428       PT LONG TERM GOAL #1   Title increase bil LE gross strength to 4+/5 to promtoe hip/ knee stability and promote efficient posture    Time 8    Period Weeks    Status New    Target Date 01/19/20      PT LONG TERM GOAL #2   Title increase bil Gross UE strength to >/= 4+/5 to assist with functional lifting/ carrying / pushing and pull as it relates to potential work related tasks.    Time 8    Period Weeks    Status New    Target Date 01/19/20      PT LONG TERM GOAL #3   Title improve 5 x STS to </= 12 seconds and TUG to </= 10 seconds to for functiona/ efficent and safe mobility    Time 8    Period Weeks    Status New    Target Date 01/19/20      PT LONG TERM GOAL #4   Title pt to be able to stand and walk for >/= 1 hour for functional endurnace required for work tasks and community ambulation with no report of pain    Time 8    Period Weeks    Status New    Target Date 01/19/20      PT LONG TERM GOAL #5   Title increase FOTO score to </=44% limited to demo improvement in function    Time 8    Period Weeks    Status New    Target Date 01/19/20      Additional Long Term Goals   Additional Long Term Goals Yes      PT LONG TERM GOAL #6   Title pt to be IND with all HEP to maintain and progress current LOF IND    Time 8    Period Weeks    Status New  Target Date 01/19/20                 Plan - 11/28/19 1236    Clinical Impression Statement Christine Butler reports being consistent with her HEP. She already has demonstrating improvement with gait biomechancis and sit to stand transitions with limited UE assitance but does continue to fatigue quickly. She completed all strengthening exercise today, requiring intermittent rest breaks. Held off on core strengthening in supine/ recumbent position due to pulling/ sorenss from staples which she reports will be removed next week.    PT Next Visit Plan unable to lay supine due to sutures, reviewe/ update HEP, gross LE/ UE  strengthening,   STW for low back (she did well using massage chair), gentle core strengthening,    PT Home Exercise Plan [phone] - low back stretch (seated walking hands on chair), sit to stand with hands on knees, lower trunk rotations, sidelying hip abduction, gripping, finger extnesion with putty/ rubber band.    Consulted and Agree with Plan of Care Patient           Patient will benefit from skilled therapeutic intervention in order to improve the following deficits and impairments:  Abnormal gait, Improper body mechanics, Postural dysfunction, Increased muscle spasms, Decreased strength, Pain, Decreased activity tolerance, Decreased balance, Decreased endurance  Visit Diagnosis: Unsteadiness on feet  Muscle weakness (generalized)  Other abnormalities of gait and mobility  Abnormal posture  Muscle spasm of back     Problem List Patient Active Problem List   Diagnosis Date Noted  . Arthritis   . CAD (coronary artery disease)   . PONV (postoperative nausea and vomiting)   . SVD (spontaneous vaginal delivery)   . Urinary tract bacterial infections   . Other insomnia 09/05/2019  . 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 (HFpEF) heart failure with preserved ejection fraction ([place]) 08/09/2019  . High grade squamous intraepithelial lesion (HGSIL) on cytologic smear of cervix 05/04/2019  . Familial hyperlipidemia 03/30/2019  . H/O two vessel coronary artery bypass graft 03/30/2019  . H/O LEEP 03/30/2019  . Hypo-osmolality and hyponatremia 03/30/2019  . Atypical squamous cells of undetermined significance (ASCUS) on Papanicolaou smear of cervix 08/05/2018  . Upper GI bleed 02/01/2018  . GI bleed 02/01/2018  . Ascites--mild this admit 11/06/2017  . Atypical chest pain 11/05/2017  . Dyslipidemia, goal LDL below 70 10/13/2017  . Acute blood loss anemia 04/20/2017  . Elevated LFTs 04/20/2017  . Coronary artery disease 03/24/2017  . S/P CABG x 2 03/24/2017  . CAD (coronary artery  disease), native coronary artery 03/23/2017  . Cirrhosis ([place]) 03/16/2017  . 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 GERD (gastroesophageal reflux disease) 01/28/2012  . Primary biliary cirrhosis ([place]) 01/28/2012  . IBS (irritable bowel syndrome) 01/28/2012   Starr Lake PT, DPT, LAT, ATC  11/28/19  12:44 PM      [facility] [address] [place], [place], [address] Phone: [phone]   Fax:  [phone]  Name: Christine Butler MRN: [id number] Date of Birth: 07-Jan-1971

## 2019-11-29 ENCOUNTER — Other Ambulatory Visit: Payer: Self-pay | Admitting: *Deleted

## 2019-11-29 ENCOUNTER — Ambulatory Visit
Admit: 2019-11-29 | Discharge: 2019-12-02 | Disposition: A | Discharge status: Home / Self Care | Payer: PRIVATE HEALTH INSURANCE | Admitting: Surgery

## 2019-11-29 ENCOUNTER — Telehealth
Admit: 2019-11-29 | Discharge: 2019-11-30 | Payer: PRIVATE HEALTH INSURANCE | Attending: Psychiatry | Primary: Psychiatry

## 2019-11-29 DIAGNOSIS — F061 Catatonic disorder due to known physiological condition: Principal | ICD-10-CM

## 2019-11-29 DIAGNOSIS — G4709 Other insomnia: Principal | ICD-10-CM

## 2019-11-29 DIAGNOSIS — R410 Disorientation, unspecified: Principal | ICD-10-CM

## 2019-11-29 DIAGNOSIS — Z944 Liver transplant status: Principal | ICD-10-CM

## 2019-11-29 DIAGNOSIS — Z79899 Other long term (current) drug therapy: Principal | ICD-10-CM

## 2019-11-29 DIAGNOSIS — Z951 Presence of aortocoronary bypass graft: Secondary | ICD-10-CM | POA: Diagnosis not present

## 2019-11-29 DIAGNOSIS — N390 Urinary tract infection, site not specified: Secondary | ICD-10-CM | POA: Diagnosis not present

## 2019-11-29 DIAGNOSIS — J9811 Atelectasis: Secondary | ICD-10-CM | POA: Diagnosis not present

## 2019-11-29 DIAGNOSIS — R5383 Other fatigue: Secondary | ICD-10-CM | POA: Diagnosis not present

## 2019-11-29 DIAGNOSIS — J90 Pleural effusion, not elsewhere classified: Secondary | ICD-10-CM | POA: Diagnosis not present

## 2019-11-29 DIAGNOSIS — I509 Heart failure, unspecified: Secondary | ICD-10-CM | POA: Diagnosis not present

## 2019-11-29 DIAGNOSIS — R197 Diarrhea, unspecified: Secondary | ICD-10-CM | POA: Diagnosis not present

## 2019-11-29 DIAGNOSIS — D849 Immunodeficiency, unspecified: Secondary | ICD-10-CM | POA: Diagnosis not present

## 2019-11-29 DIAGNOSIS — R918 Other nonspecific abnormal finding of lung field: Secondary | ICD-10-CM | POA: Diagnosis not present

## 2019-11-29 DIAGNOSIS — Z5181 Encounter for therapeutic drug level monitoring: Secondary | ICD-10-CM | POA: Diagnosis not present

## 2019-11-29 DIAGNOSIS — Z20822 Contact with and (suspected) exposure to covid-19: Secondary | ICD-10-CM | POA: Diagnosis not present

## 2019-11-29 DIAGNOSIS — Z7982 Long term (current) use of aspirin: Secondary | ICD-10-CM | POA: Diagnosis not present

## 2019-11-29 DIAGNOSIS — R509 Fever, unspecified: Secondary | ICD-10-CM | POA: Diagnosis not present

## 2019-11-29 DIAGNOSIS — I2510 Atherosclerotic heart disease of native coronary artery without angina pectoris: Secondary | ICD-10-CM | POA: Diagnosis not present

## 2019-11-29 LAB — COMPREHENSIVE METABOLIC PANEL
A/G RATIO: 2 (ref 1.2–2.2)
ALBUMIN: 4.1 g/dL (ref 3.8–4.8)
ALBUMIN: 3.4 g/dL (ref 3.4–5.0)
ALKALINE PHOSPHATASE: 281 IU/L — ABNORMAL HIGH (ref 44–121)
ALKALINE PHOSPHATASE: 519 U/L — ABNORMAL HIGH (ref 46–116)
ALT (SGPT): 12 IU/L (ref 0–32)
ALT (SGPT): 35 U/L (ref 10–49)
ANION GAP: 9 mmol/L (ref 5–14)
AST (SGOT): 10 IU/L (ref 0–40)
AST (SGOT): 36 U/L — ABNORMAL HIGH (ref <=34)
BILIRUBIN TOTAL: 0.6 mg/dL (ref 0.0–1.2)
BILIRUBIN TOTAL: 0.7 mg/dL (ref 0.3–1.2)
BLOOD UREA NITROGEN: 14 mg/dL (ref 9–23)
BUN / CREAT RATIO: 12 (ref 9–23)
BUN / CREAT RATIO: 11
CALCIUM: 8.9 mg/dL (ref 8.7–10.2)
CALCIUM: 8.7 mg/dL (ref 8.7–10.4)
CHLORIDE: 107 mmol/L — ABNORMAL HIGH (ref 96–106)
CHLORIDE: 108 mmol/L — ABNORMAL HIGH (ref 98–107)
CO2: 18 mmol/L — ABNORMAL LOW (ref 20–29)
CO2: 18 mmol/L — ABNORMAL LOW (ref 20.0–31.0)
CREATININE: 1.33 mg/dL — ABNORMAL HIGH (ref 0.57–1.00)
CREATININE: 1.31 mg/dL — ABNORMAL HIGH
EGFR CKD-EPI NON-AA FEMALE: 48 mL/min/{1.73_m2} — ABNORMAL LOW (ref >=60)
GLOBULIN, TOTAL: 2.1 g/dL (ref 1.5–4.5)
GLUCOSE RANDOM: 120 mg/dL (ref 70–179)
GLUCOSE: 97 mg/dL (ref 65–99)
POTASSIUM: 4.6 mmol/L (ref 3.5–5.2)
POTASSIUM: 4.4 mmol/L (ref 3.4–4.5)
PROTEIN TOTAL: 6.3 g/dL (ref 5.7–8.2)
SODIUM: 137 mmol/L (ref 134–144)
SODIUM: 135 mmol/L (ref 135–145)
TOTAL PROTEIN: 6.2 g/dL (ref 6.0–8.5)

## 2019-11-29 LAB — CBC W/ DIFFERENTIAL
BANDED NEUTROPHILS ABSOLUTE COUNT: 0 10*3/uL (ref 0.0–0.1)
BASOPHILS RELATIVE PERCENT: 1 %
EOSINOPHILS ABSOLUTE COUNT: 0.1 10*3/uL (ref 0.0–0.4)
EOSINOPHILS RELATIVE PERCENT: 1 %
HEMATOCRIT: 26.8 % — ABNORMAL LOW (ref 34.0–46.6)
HEMOGLOBIN: 9.1 g/dL — ABNORMAL LOW (ref 11.1–15.9)
IMMATURE GRANULOCYTES: 1 %
LYMPHOCYTES ABSOLUTE COUNT: 0.4 10*3/uL — ABNORMAL LOW (ref 0.7–3.1)
LYMPHOCYTES RELATIVE PERCENT: 6 %
MEAN CORPUSCULAR HEMOGLOBIN CONC: 34 g/dL (ref 31.5–35.7)
MEAN CORPUSCULAR HEMOGLOBIN: 32.9 pg (ref 26.6–33.0)
MEAN CORPUSCULAR VOLUME: 97 fL (ref 79–97)
MONOCYTES ABSOLUTE COUNT: 0.6 10*3/uL (ref 0.1–0.9)
MONOCYTES RELATIVE PERCENT: 10 %
PLATELET COUNT: 114 10*3/uL — ABNORMAL LOW (ref 150–450)
RED BLOOD CELL COUNT: 2.77 x10E6/uL — ABNORMAL LOW (ref 3.77–5.28)
RED CELL DISTRIBUTION WIDTH: 17.1 % — ABNORMAL HIGH (ref 11.7–15.4)
WHITE BLOOD CELL COUNT: 6.3 10*3/uL (ref 3.4–10.8)

## 2019-11-29 LAB — MAGNESIUM
Magnesium:MCnc:Pt:Ser/Plas:Qn:: 1.5 — ABNORMAL LOW
Magnesium:MCnc:Pt:Ser/Plas:Qn:: 1.6

## 2019-11-29 LAB — CBC W/ AUTO DIFF
BASOPHILS ABSOLUTE COUNT: 0 10*9/L (ref 0.0–0.1)
BASOPHILS RELATIVE PERCENT: 0.3 %
EOSINOPHILS ABSOLUTE COUNT: 0 10*9/L (ref 0.0–0.4)
EOSINOPHILS RELATIVE PERCENT: 0.5 %
HEMATOCRIT: 28.2 % — ABNORMAL LOW (ref 36.0–46.0)
HEMOGLOBIN: 9.6 g/dL — ABNORMAL LOW (ref 12.0–16.0)
LARGE UNSTAINED CELLS: 0 % (ref 0–4)
LYMPHOCYTES ABSOLUTE COUNT: 0.2 10*9/L — ABNORMAL LOW (ref 1.5–5.0)
LYMPHOCYTES RELATIVE PERCENT: 2.7 %
MEAN CORPUSCULAR HEMOGLOBIN CONC: 34.1 g/dL (ref 31.0–37.0)
MEAN CORPUSCULAR HEMOGLOBIN: 34.4 pg — ABNORMAL HIGH (ref 26.0–34.0)
MEAN CORPUSCULAR VOLUME: 100.9 fL — ABNORMAL HIGH (ref 80.0–100.0)
MEAN PLATELET VOLUME: 8.3 fL (ref 7.0–10.0)
MONOCYTES RELATIVE PERCENT: 2.6 %
NEUTROPHILS ABSOLUTE COUNT: 6.1 10*9/L (ref 2.0–7.5)
NEUTROPHILS RELATIVE PERCENT: 93.5 %
PLATELET COUNT: 125 10*9/L — ABNORMAL LOW (ref 150–440)
RED BLOOD CELL COUNT: 2.8 10*12/L — ABNORMAL LOW (ref 4.00–5.20)
RED CELL DISTRIBUTION WIDTH: 18.7 % — ABNORMAL HIGH (ref 12.0–15.0)
WBC ADJUSTED: 6.6 10*9/L (ref 4.5–11.0)

## 2019-11-29 LAB — TACROLIMUS BLOOD: Tacrolimus:MCnc:Pt:Bld:Qn:LC/MS/MS: 4.4

## 2019-11-29 LAB — PREGNANCY TEST URINE: Choriogonadotropin (pregnancy test):PrThr:Pt:Urine:Ord:: NEGATIVE

## 2019-11-29 LAB — URINALYSIS WITH CULTURE REFLEX
BLOOD UA: NEGATIVE
GLUCOSE UA: NEGATIVE
KETONES UA: NEGATIVE
NITRITE UA: NEGATIVE
PROTEIN UA: NEGATIVE
RBC UA: <1 /HPF (ref <=4)
SPECIFIC GRAVITY UA: 1.008 (ref 1.003–1.030)
SQUAMOUS EPITHELIAL: <1 /HPF (ref 0–5)
UROBILINOGEN UA: 0.2
WBC UA: 12 /HPF — ABNORMAL HIGH (ref 0–5)

## 2019-11-29 LAB — BILIRUBIN DIRECT: Bilirubin.glucuronidated+Bilirubin.albumin bound:MCnc:Pt:Ser/Plas:Qn:: 0.26

## 2019-11-29 LAB — SMEAR REVIEW

## 2019-11-29 LAB — GAMMA GLUTAMYL TRANSFERASE: Gamma glutamyl transferase:CCnc:Pt:Ser/Plas:Qn:: 97 — ABNORMAL HIGH

## 2019-11-29 LAB — INR: Coagulation tissue factor induced.INR:RelTime:Pt:PPP:Qn:Coag: 1.21

## 2019-11-29 LAB — LEUKOCYTE ESTERASE UA

## 2019-11-29 LAB — ALKALINE PHOSPHATASE: Alkaline phosphatase:CCnc:Pt:Ser/Plas:Qn:: 519 — ABNORMAL HIGH

## 2019-11-29 LAB — PHOSPHORUS: Phosphate:MCnc:Pt:Ser/Plas:Qn:: 2.9

## 2019-11-29 LAB — PHOSPHORUS, SERUM: Phosphate:MCnc:Pt:Ser/Plas:Qn:: 3.1

## 2019-11-29 LAB — SLIDE REVIEW

## 2019-11-29 LAB — PLATELET COUNT: Platelets:NCnc:Pt:Bld:Qn:Automated count: 114 — ABNORMAL LOW

## 2019-11-29 LAB — HEMATOCRIT: Hematocrit:VFr:Pt:Bld:Qn:: 28.2 — ABNORMAL LOW

## 2019-11-29 LAB — LACTATE BLOOD VENOUS: Lactate:SCnc:Pt:BldV:Qn:: 0.5

## 2019-11-29 LAB — CALCIUM: Calcium:MCnc:Pt:Ser/Plas:Qn:: 8.9

## 2019-11-29 MED ADMIN — promethazine (PHENERGAN) 12.5 mg in sodium chloride (NS) 0.9 % 25 mL infusion: 12.5 mg | INTRAVENOUS | @ 18:00:00 | Stop: 2019-11-29

## 2019-11-29 MED ADMIN — lactated ringers bolus 1,000 mL: 1000 mL | INTRAVENOUS | @ 18:00:00 | Stop: 2019-11-29

## 2019-11-29 NOTE — Unmapped (Addendum)
[facility]  Psychiatry   Established Patient E&M Service - Outpatient       Assessment:    Kathy Armstrong presents for follow-up evaluation. Pt w/ no evidence of catatonia or delirium today. Pt has been stable since discharge on current med regimen, so will not make any changes today, especially given the acute medical issue with fever. Will consider cross-taper from Zyprexa to Remeron once pt is medically more stable as Remeron has better side effect profile than Zyprexa. No acte safety concerns. Follow up in 6-8 weeks.    Identifying Information:  Kathy Armstrong is a 49 y.o. 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 She tapered off of Ativan after discharge with continued resolution of catatonia on initial outpatient evaluation by psychiatry on September 05, 2019, but she was shortly found to be catatonic again in the setting of likely hepatic encephalopathy, requiring a short inpatient medicine admission in Aug 2021. Her catatonic symptoms at that time also quickly resolved with scheduled Ativan and improvements with hepatic encephalopathy. She was discharged on 10/01/19 with Ativan taper, which was continued on the outpatient follow-up. In late Aug 2021, pt had a liver transplant. Her hospital course was complicated by re-emergence of catatonia and subsequently delirium post-transplant, and she was started on Ativan and subsequently Zyprexa, with good effect, which was her discharge psych regimen as well.    Risk Assessment:  An assessment of suicide and violence risk factors was performed as part of this evaluation and is not significantly changed from the last visit.   While future psychiatric events cannot be accurately predicted, the patient does not currently require acute inpatient psychiatric care and does not currently meet [facility] involuntary commitment criteria.      Plan:    Problem 1: Catatonia  Status of problem: improved or improving  Interventions:   - Ativan taper completed on 9/24    Problem 2: Delirium  Status of problem:  improved or improving  Interventions:  - Continue Zyprexa 5mg  PO at bedtime    Problem 2: Insomnia  Status of problem: improved or improving  Interventions:   - Continue melatonin 3mg  qEvening  - Continue Zyprexa as above  - Will consider cross-taper from Zyprexa to Remeron at next visit given more favorable side effect profile    Psychotherapy provided:  No billable psychotherapy service provided.    Patient has been given this writer's contact information as well as the [facility] Psychiatry urgent line number. The patient has been instructed to call 911 for emergencies.    Patient was seen and plan of care was discussed with the Attending MD, Dr. Elita Boone, who agrees with the above statement and plan.      Subjective:    Interval History:   Doing great since discharge. Sleeping well. No confusion. Only taking Zyprexa at night and feels like it is helping her sleep. Mood is really good. Sometimes a little bit frustrated at times about the progress with recovery and a little bit nervous today because she's been told to go to the ED due to having a fever. Having GI issues, especially with diarrhea and nausea. No appetite, eatinglike a bird. Never tried Remeron.       Objective:  Mental Status Exam:  Appearance:    Appears stated age, Well nourished, Well developed and Clean/Neat   Motor:   No abnormal movements   Speech/Language:    Normal rate, volume, tone, fluency and Language intact, well formed   Mood:   Really good   Affect:   Calm, Cooperative, Euthymic, Full and Mood congruent   Thought process and Associations:   Logical, linear, clear, coherent, goal directed   Abnormal/psychotic thought content:     Denies SI, HI, self harm, delusions, obsessions, paranoid ideation, or ideas of reference   Perceptual disturbances:     Denies auditory and visual hallucinations, behavior not concerning for response to internal stimuli     Other:   None     Visit was completed by video (or phone) and the appropriate disclaimer has been included below.        I spent 10 minutes on the real-time audio and video with the patient on the date of service. I spent an additional 10 minutes on pre- and post-visit activities on the date of service.     The patient was physically located in [place] or a state in which I am permitted to provide care. The patient and/or parent/guardian understood that s/he may incur co-pays and cost sharing, and agreed to the telemedicine visit. The visit was reasonable and appropriate under the circumstances given the patient's presentation at the time.    The patient and/or parent/guardian has been advised of the potential risks and limitations of this mode of treatment (including, but not limited to, the absence of in-person examination) and has agreed to be treated using telemedicine. The patient's/patient's family's questions regarding telemedicine have been answered.     If the visit was completed in an ambulatory setting, the patient and/or parent/guardian has also been advised to contact their provider???s office for worsening conditions, and seek emergency medical treatment and/or call 911 if the patient deems either necessary.    Park Pope, MD

## 2019-11-29 NOTE — Unmapped (Addendum)
Pt is 42 days out from liver transplant and is having fevers. Pt told to come by transplant coordinator. Pt took tylenol this morning

## 2019-11-29 NOTE — Unmapped (Signed)
Follow-up instructions:  -- Please continue taking your medications as prescribed for your mental health.   -- Do not make changes to your medications, including taking more or less than prescribed, unless under the supervision of your physician. Be aware that some medications may make you feel worse if abruptly stopped  -- Please refrain from using illicit substances, as these can affect your mood and could cause anxiety or other concerning symptoms.   -- Seek further medical care for any increase in symptoms or new symptoms such as thoughts of wanting to hurt yourself or hurt others.     Contact info:  Life-threatening emergencies: call 911 or go to the nearest ER for medical or psychiatric attention.     Issues that need urgent attention but are not life threatening: call the clinic outpatient frontdesk at [phone] for assistance.     Non-urgent routine concerns, questions, and refill requests: please send me a MyChart message and I will get back to you within 2 business days.    Regarding appointments:  - If you need to cancel your appointment, we ask that you call [phone] at least 24 hours before your scheduled appointment.  - If for any reason you arrive 15 minutes later than your scheduled appointment time, you may not be seen and your visit may be rescheduled.  - Please remember that we will not automatically reschedule missed appointments.  - If you miss two (2) appointments without letting us know in advance, you will likely be referred to a provider in your community.  - We will do our best to be on time. Sometimes an emergency will arise that might cause your clinician to be late. We will try to inform you of this when you check in for your appointment. If you wait more than 15 minutes past your appointment time without such notice, please speak with the front desk staff.    In the event of bad weather, the clinic staff will attempt to contact you, should your appointment need to be rescheduled. Additionally, you can call the Patient Weather Line [phone] for system-wide clinic status    For more information and reminders regarding clinic policies (these were provided when you were admitted to the clinic), please ask the front desk.

## 2019-11-29 NOTE — Unmapped (Signed)
Transplant Surgery H&P Note    11/29/2019    Room: 76-D/76-D    Assessment/Plan:    Kathy Armstrong is a 49 y.o. 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    - Urine culture sent, follow up results  - IV antibiotics  - Regular diet  - Home immunosuppression medications started (myfortic, prednisone, Prograf)  - Daily labs including tacrolimus trough levels  - Follow LFTs      History of Present Illness:     Kathy Armstrong is a 49 y.o. 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 She was last seen in clinic on 11/21/19, at which point she was doing well at home. Patient reported that since she was last seen in clinic, she has been in her usual state of health until Sunday, 2 days prior to presentation, when she woke up with chills and was feeling unwell. She checks her temperature daily at home and had a fever to 100.9. She has had intermittent fevers in the last 3 days and has been taking Tylenol. Associated with headache, urinary urgency, dysuria. Denied any chest pain, shortness of breath, abdominal pain. She woke up today feeling unwell with Tmax 100.9 and called in to clinic, was then advised to present to ED.    In the ED, patient is afebrile and hemodynamically stable. Workup is notable for normal WBC. Cr 1.31, downtrending from previous labs. Tbili 0.7, AST 36, ALT 35, alk phos 519. UA notable for small leuk esterase, WBC 12, rare bacteria. Urine culture collected.      Past Medical History    Past Medical History:   Diagnosis Date   ??? CAD (coronary artery disease)    ??? Hiatal hernia    ??? Osteopenia    ??? Primary biliary cholangitis (CMS-HCC)        Past Surgical History    Past Surgical History:   Procedure Laterality Date   ??? CERVICAL BIOPSY  W/ LOOP ELECTRODE EXCISION     ??? CORONARY ARTERY BYPASS GRAFT     ??? PR RIGHT HEART CATH O2 SATURATION & CARDIAC OUTPUT N/A 08/12/2019    Procedure: Right Heart Catheterization;  Surgeon: Marlaine Hind, MD;  Location: [facility] CATH;  Service: Cardiology   ??? PR TRANSPLANT LIVER,ALLOTRANSPLANT Midline 10/18/2019    Procedure: Liver Allotransplantation; Orthotopic, Partial Or Whole, From Cadaver Or Living Donor, Any Age;  Surgeon: Loney Hering, MD;  Location: MAIN OR [facility];  Service: Transplant   ??? PR TRANSPLANT,PREP DONOR LIVER, WHOLE N/A 10/18/2019    Procedure: Rogelia Boga Std Prep Cad Donor Whole Liver Gft Prior Tnsplnt,Inc Chole,Diss/Rem Surr Tissu Wo Triseg/Lobe Splt;  Surgeon: Loney Hering, MD;  Location: MAIN OR [facility];  Service: Transplant   ??? PR TRANSPLANT,PREP DONOR LIVER/ARTERIAL N/A 10/18/2019    Procedure: BACKBNCH RECONSTRUCT OF CAD/LIVE DONOR LIVER GFT PRIOR TRANSPLANT; ARTERIAL ANASTAMOSIS, EA;  Surgeon: Loney Hering, MD;  Location: MAIN OR [facility];  Service: Transplant   ??? PR UPPER GI ENDOSCOPY,DIAGNOSIS  07/19/2019    Procedure: UGI ENDO, INCLUDE ESOPHAGUS, STOMACH, & DUODENUM &/OR JEJUNUM; DX W/WO COLLECTION SPECIMN, BY BRUSH OR WASH;  Surgeon: Lloyd Huger  Nancy Fetter, MD;  Location: GI PROCEDURES [facility];  Service: Gastroenterology   ??? PR UPPER GI ENDOSCOPY,LIGAT VARIX N/A 04/01/2019    Procedure: UGI ENDO; W/BAND LIG ESOPH &/OR GASTRIC VARICES;  Surgeon: Beverly Milch, MD;  Location: GI [facility]URES [facility];  Service: Gastroenterology       Medications      No current facility-administered medications on file prior to encounter.     Current Outpatient Medications on File Prior to Encounter   Medication Sig   ??? acetaminophen (TYLENOL) 325 MG tablet Take 2 tablets (650 mg total) by mouth every six (6) hours as needed for pain or fever (> 100.4 F).   ??? aspirin (ASPIR-81) 81 MG tablet Take 1 tablet (81 mg total) by mouth daily.   ??? atovaquone (MEPRON) 750 mg/5 mL suspension Holding script for now (1500mg  daily) as patient wanting to try pentamidine each month   ??? carvediloL (COREG) 25 MG tablet Take 1 tablet (25 mg total) by mouth Two (2) times a day.   ??? ergocalciferol-1,250 mcg, 50,000 unit, (DRISDOL) 1,250 mcg (50,000 unit) capsule Take 1 capsule (1,250 mcg total) by mouth once a week.   ??? fluticasone propionate (FLONASE) 50 mcg/actuation nasal spray Use 1 spray into each nostril daily as needed for rhinitis.   ??? folic acid (FOLVITE) 1 MG tablet Take 1 tablet (1 mg total) by mouth daily.   ??? loperamide (IMODIUM) 2 mg capsule Take 2 capsules (4 mg total) by mouth Three (3) times a day as needed for diarrhea.   ??? magnesium oxide-Mg AA chelate (MAGNESIUM, AMINO ACID CHELATE,) 133 mg Tab Take 1 tablet by mouth Two (2) times a day. HOLD until directed to start by your coordinator.   ??? melatonin 3 mg Tab Take 1 tablet (3 mg total) by mouth every evening.   ??? multivit with min #[id number] (DEKAS PLUS, FOLIC ACID,) 200 mcg-1,000 DGL-87 mg cap Take 1 tablet by mouth. Take 1 tablet by mouth daily   ??? mycophenolate (MYFORTIC) 180 MG EC tablet Take 1 tablet (180 mg total) by mouth Two (2) times a day.   ??? NON FORMULARY Take 500 mg by mouth daily with evening meal. Vitafusion Calcium: Take two gummies by mouth daily   ??? OLANZapine (ZYPREXA) 5 MG tablet Take 1 tablet (5 mg total) by mouth nightly.   ??? omeprazole (PRILOSEC) 40 MG capsule Take 1 capsule (40 mg total) by mouth daily.   ??? ondansetron (ZOFRAN-ODT) 4 MG disintegrating tablet Dissolve 1 tablet on the tongue every twelve (12) hours as needed for nausea or vomiting for up to 7 doses.   ??? oxyCODONE (ROXICODONE) 5 MG immediate release tablet Take 1-2 tablets (5-10 mg total) by mouth every six (6) hours as needed for pain.   ??? pentamidine in sterile water (PF) Inhale 6 mL (300 mg total) every twenty-eight (28) days.   ??? predniSONE (DELTASONE) 5 MG tablet Take 1 tablet (5 mg total) by mouth daily.   ??? tacrolimus (PROGRAF) 1 MG capsule Take 5mg  in the morning and 4mg  at night (held 10/2 pm dose and 10/3 am & pm dose)   ??? ursodioL (ACTIGALL) 300 mg capsule Take 1 capsule (300 mg total) by mouth Three (3) times a day.   ??? valGANciclovir (VALCYTE) 450 mg tablet Take 1 tablet (450 mg total) by mouth daily.       Allergies    Codeine, Erythromycin, and Fentanyl      Family History    History reviewed. No  pertinent family history.      Social History:    Social History     Tobacco Use   ??? Smoking status: Never Smoker   ??? Smokeless tobacco: Never Used   Substance Use Topics   ??? Alcohol use: Not Currently   ??? Drug use: Not Currently         Review of Systems    A 10 system review of systems was negative except as noted in HPI.      Vital Signs  BP 137/68  - Pulse 73  - Temp 36.8 ??C (Oral)  - Resp 16  - SpO2 99%   There is no height or weight on file to calculate BMI.    Physical Exam  General Appearance: No acute distress. Alert and oriented.   Pulmonary: Normal respiratory effort on room air.  Cardiovascular: Normal rate.  Abdomen: Soft, nondistended. Tender to palpation over suprapubic region. Staples in place over Avilla incision, no erythema, fluctuance, or drainage.  Back: +R CVA tenderness.  Extremities: Warm and well perfused.  Neurologic: No motor abnormalities noted. Sensation grossly intact.  Skin: Skin color normal. No rashes or lesions.      Data Review  Labs:  All lab results last 24 hours:    Recent Results (from the past 24 hour(s))   Comprehensive Metabolic Panel    Collection Time: 11/29/19  1:35 PM   Result Value Ref Range    Sodium 135 135 - 145 mmol/L    Potassium 4.4 3.4 - 4.5 mmol/L    Chloride 108 (H) 98 - 107 mmol/L    Anion Gap 9 5 - 14 mmol/L    CO2 18.0 (L) 20.0 - 31.0 mmol/L    BUN 14 9 - 23 mg/dL    Creatinine 1.61 (H) 0.55 - 1.02 mg/dL    BUN/Creatinine Ratio 11     EGFR CKD-EPI Non-African American, Female 48 (L) >=60 mL/min/1.62m2    EGFR CKD-EPI African American, Female 55 (L) >=60 mL/min/1.39m2    Glucose 120 70 - 179 mg/dL    Calcium 8.7 8.7 - 09.6 mg/dL    Albumin 3.4 3.4 - 5.0 g/dL    Total Protein 6.3 5.7 - 8.2 g/dL    Total Bilirubin 0.7 0.3 - 1.2 mg/dL    AST 36 (H) <=04 U/L    ALT 35 10 - 49 U/L    Alkaline Phosphatase 519 (H) 46 - 116 U/L   Lactate Sepsis, Venous    Collection Time: 11/29/19  1:35 PM   Result Value Ref Range    Lactate, Venous 0.5 0.5 - 1.8 mmol/L   Magnesium Level    Collection Time: 11/29/19  1:35 PM   Result Value Ref Range    Magnesium 1.5 (L) 1.6 - 2.6 mg/dL   Phosphorus Level    Collection Time: 11/29/19  1:35 PM   Result Value Ref Range    Phosphorus 2.9 2.4 - 5.1 mg/dL   PT-INR    Collection Time: 11/29/19  1:35 PM   Result Value Ref Range    PT 14.1 (H) 10.3 - 13.4 sec    INR 1.21    CBC w/ Differential    Collection Time: 11/29/19  1:35 PM   Result Value Ref Range    WBC 6.6 4.5 - 11.0 10*9/L    RBC 2.80 (L) 4.00 - 5.20 10*12/L    HGB 9.6 (L) 12.0 - 16.0 g/dL    HCT 54.0 (L) 98.1 - 46.0 %  MCV 100.9 (H) 80.0 - 100.0 fL    MCH 34.4 (H) 26.0 - 34.0 pg    MCHC 34.1 31.0 - 37.0 g/dL    RDW 43.3 (H) 29.5 - 15.0 %    MPV 8.3 7.0 - 10.0 fL    Platelet 125 (L) 150 - 440 10*9/L    Variable HGB Concentration Slight (A) Not Present    Neutrophils % 93.5 %    Lymphocytes % 2.7 %    Monocytes % 2.6 %    Eosinophils % 0.5 %    Basophils % 0.3 %    Neutrophil Left Shift 1+ (A) Not Present    Absolute Neutrophils 6.1 2.0 - 7.5 10*9/L    Absolute Lymphocytes 0.2 (L) 1.5 - 5.0 10*9/L    Absolute Monocytes 0.2 0.2 - 0.8 10*9/L    Absolute Eosinophils 0.0 0.0 - 0.4 10*9/L    Absolute Basophils 0.0 0.0 - 0.1 10*9/L    Large Unstained Cells 0 0 - 4 %    Macrocytosis Marked (A) Not Present    Anisocytosis Moderate (A) Not Present   Urinalysis with Culture Reflex    Collection Time: 11/29/19  2:29 PM    Specimen: Clean Catch; Urine   Result Value Ref Range    Color, UA Yellow     Clarity, UA Clear     Specific Gravity, UA 1.008 1.003 - 1.030    pH, UA 5.0 5.0 - 9.0    Leukocyte Esterase, UA Small (A) Negative    Nitrite, UA Negative Negative    Protein, UA Negative Negative    Glucose, UA Negative Negative    Ketones, UA Negative Negative    Urobilinogen, UA 0.2 mg/dL 0.2 mg/dL, 1.0 mg/dL    Bilirubin, UA Negative Negative    Blood, UA Negative Negative    RBC, UA <1 <=4 /HPF    WBC, UA 12 (H) 0 - 5 /HPF    Squam Epithel, UA <1 0 - 5 /HPF    Bacteria, UA Rare (A) None Seen /HPF   Pregnancy Qualitative, Urine (Sent to lab)    Collection Time: 11/29/19  2:29 PM   Result Value Ref Range    Pregnancy Test, Urine Negative Negative       Imaging:  XR Chest Portable    Result Date: 11/29/2019  EXAM: XR CHEST PORTABLE DATE: 11/29/2019 ACCESSION: [id number] UN DICTATED: 11/29/2019 2:17 PM INTERPRETATION LOCATION: [facility] CLINICAL INDICATION: 49 years old Female with FEVER  COMPARISON: None TECHNIQUE: Portable Chest Radiograph. FINDINGS: Sequelae of CABG. Right-sided Port-A-Cath with tip in the cavoatrial junction. Patchy right basilar opacity. Small right-sided pleural effusion. No pneumothorax. Stable cardiac mediastinal silhouette.     Patchy right basilar opacity, which may represent atelectasis versus aspiration or infection. Small right-sided pleural effusion.

## 2019-11-29 NOTE — Unmapped (Signed)
Received message from patient this morning:  ---- Message -----       From:Magda Maurine Minister       Sent:11/29/2019  8:31 AM EDT         UJ:[id number] L Sreshta Cressler    Subject:Fever    Kim,   I woke up this morning feeling crappy again. Muscle aches in back of neck and shoulder , temp of 100.9. And headache.  Tummy felt off as soon as I woke up . Lower left side cramping today as well but that has subsided at this moment .  :/     Ashe     Also noted that all of her Respiratory pathogen panel with covid was not detected.     Called and talked with Dr. Celine Mans reviewing this information:  42 days out from transplant on 8/24; discharged 9/17.  Yesterday Discussed with Leslye Peer, PA that patient c/o fever 100.8 and chills Sunday and yesterday (Monday) 100.1 without tylenol, headache and cold like symptoms. Candise Bowens mentioned if she has fever over 100.4 another day that she may need to be admitted.   Yesterday afternoon, we sent her to [facility] for Respiratory pathogen panel with covid and all of them (flu, covid, etc) returned as not detected.   This morning she sent a message with muscle aches, headache and temp of 100.9 (her message is below).    Per Dr. Celine Mans, patient to be brought in the ED.     Talked with patient letting her know that she will need to go to the ED since she still has fever; patient aware that her respiratory pathogen panel and COVID tests were negative. Verbalized understanding to go to the ED this morning to be assessed. Patient to arrive by 11:30am to ED.   Let the [facility] know that patient going to ED and aware all of her tests were negative yesterday in case a bed becomes available. Also talked with ED charge nurse letting her know patient had 3 days of fever and she was negative for all the tests at the [facility] center from the respiratory pathogen panel and covid tests yesterday; to arrive by 11:30am.   Sent email to [facility] fellow, inpatient coordinator, inpatient case managers, inpatient pharmacist and inpatient PA to be aware patient coming to the ED.

## 2019-11-29 NOTE — Unmapped (Signed)
Tacrolimus Therapeutic Monitoring Pharmacy Note    Oneisha Degrave is a 49 y.o. female starting tacrolimus.     Indication: Kidney transplant     Date of Transplant: 10/18/19      Prior Dosing Information: Home regimen 5 mg QAM and 4 mg QPM     Goals:  Therapeutic Drug Levels  Tacrolimus trough goal: 8-10 ng/mL    Additional Clinical Monitoring/Outcomes  ?? Monitor renal function (SCr and urine output) and liver function (LFTs)  ?? Monitor for signs/symptoms of adverse events (e.g., hyperglycemia, hyperkalemia, hypomagnesemia, hypertension, headache, tremor)    Results:   Tacrolimus level: Not applicable    Pharmacokinetic Considerations and Significant Drug Interactions:  ??? Concurrent hepatotoxic medications: None identified  ??? Concurrent CYP3A4 substrates/inhibitors: None identified  ??? Concurrent nephrotoxic medications: None identified    Assessment/Plan:  Recommendedation(s)  ??? Continue current regimen of 5 mg QAM and 4 mg QPM    Follow-up  ??? Daily levels ordered.   ??? A pharmacist will continue to monitor and recommend levels as appropriate    Please page service pharmacist with questions/clarifications.    Willey Blade, PharmD

## 2019-11-29 NOTE — Unmapped (Signed)
 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 Tmax-100.23F, compliant with Tacrolimus and MMF. She has had increased urinary frequency since discharge from transplant admission but denies dysuria. Differential included bacteremia, pneumonia, transplant rejection, COVID and UTI. CBC not indicative of infection but she is immunosuppressed, she is anemic at Hgb- 9.6 but it is increasing when compared to past. CMP shows mild derangements but not concerning in setting of chronic illness. CXR showed small R sided pleural effusion, likely 2/2 transplant. UA revealed UTI, + leuk est, WBC- 12cells/hpf.  Will start on antibiotics and contact transplant team.  Signed out to oncoming team pending discussion with transplant.      Additional Medical Decision Making     I have reviewed the vital signs and the nursing notes. Labs and radiology results that were available during my care of the patient were independently reviewed by me and considered in my medical decision making.   I independently visualized the radiology images.   I reviewed the patient's prior medical records (recent transplant records).           History     Chief Complaint  Fever Immunocompromised      History of Present Illness   Kathy Armstrong is a 49 y.o. 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    She began having fevers, fatigue, and headache 3 days ago. Tmax was today at 100.23F. Patient compliant with immunosuppressants (Tacro and MMF), and has been treating fevers with tylenol. Denies sore throat, change of taste/smell, cough, dysuria. She has runny nose, urinary frequency, more so at night with some incontinence issues, diarrhea. States that this is different from previous UTIs. Diarrhea and urinary frequency present since admission for transplant and diarrhea is improving, down from 8-10/day to approx 3x/day, treated with Imodium. Expresses some mild, intermittent, suprapubic tenderness that can sometimes radiate to her L groin and mild RUQ tenderness. She has had no post-op wound complications.    Patient denies recent travel but daughter has been experiencing sore throat w/o fever. COVID19/RPP neg  yesterday.       Past Medical History:   Diagnosis Date   ??? CAD (coronary artery disease)    ??? Hiatal hernia    ??? Osteopenia    ??? Primary biliary cholangitis (CMS-HCC)        Patient Active Problem List   Diagnosis   ??? Primary biliary cholangitis (CMS-HCC)   ??? S/P CABG x 2   ??? Cirrhosis (CMS-HCC)   ??? Hyponatremia with normal extracellular fluid volume   ??? Dyslipidemia, goal LDL below 70   ??? Hyponatremia with excess extracellular fluid volume   ??? (HFpEF) heart failure with preserved ejection fraction (CMS-HCC)   ??? Altered mental status   ??? Catatonia   ??? Other insomnia   ??? Ascites   ??? Pancytopenia (CMS-HCC)   ??? Encephalopathy   ??? Liver replaced by transplant (CMS-HCC)   ??? Acute blood loss anemia   ??? Atypical chest pain   ??? Atypical squamous cells of undetermined significance (ASCUS) on Papanicolaou smear of cervix   ??? CAD (coronary artery disease), native coronary artery   ??? GERD (gastroesophageal reflux disease)   ???  High grade squamous intraepithelial lesion (HGSIL) on cytologic smear of cervix   ??? Hypo-osmolality and hyponatremia   ??? IBS (irritable bowel syndrome)   ??? Upper GI bleed   ??? S/P LEEP (loop electrosurgical excision procedure)   ??? Arthritis   ??? PONV (postoperative nausea and vomiting)   ??? SVD (spontaneous vaginal delivery)   ??? Urinary tract bacterial infections   ??? Port-A-Cath in place       Past Surgical History:   Procedure Laterality Date   ??? CERVICAL BIOPSY  W/ LOOP ELECTRODE EXCISION     ??? CORONARY ARTERY BYPASS GRAFT     ??? PR RIGHT HEART CATH O2 SATURATION & CARDIAC OUTPUT N/A 08/12/2019    Procedure: Right Heart Catheterization;  Surgeon: Marlaine Hind, MD;  Location: [facility] CATH;  Service: Cardiology   ??? PR TRANSPLANT LIVER,ALLOTRANSPLANT Midline 10/18/2019    Procedure: Liver Allotransplantation; Orthotopic, Partial Or Whole, From Cadaver Or Living Donor, Any Age;  Surgeon: Loney Hering, MD;  Location: MAIN OR [facility];  Service: Transplant   ??? PR TRANSPLANT,PREP DONOR LIVER, WHOLE N/A 10/18/2019    Procedure: Rogelia Boga Std Prep Cad Donor Whole Liver Gft Prior Tnsplnt,Inc Chole,Diss/Rem Surr Tissu Wo Triseg/Lobe Splt;  Surgeon: Loney Hering, MD;  Location: MAIN OR [facility];  Service: Transplant   ??? PR TRANSPLANT,PREP DONOR LIVER/ARTERIAL N/A 10/18/2019    Procedure: BACKBNCH RECONSTRUCT OF CAD/LIVE DONOR LIVER GFT PRIOR TRANSPLANT; ARTERIAL ANASTAMOSIS, EA;  Surgeon: Loney Hering, MD;  Location: MAIN OR [facility];  Service: Transplant   ??? PR UPPER GI ENDOSCOPY,DIAGNOSIS  07/19/2019    Procedure: UGI ENDO, INCLUDE ESOPHAGUS, STOMACH, & DUODENUM &/OR JEJUNUM; DX W/WO COLLECTION SPECIMN, BY BRUSH OR WASH;  Surgeon: Pia Mau, MD;  [facility]: [facility];  Service: Gastroenterology   ??? PR UPPER GI ENDOSCOPY,LIGAT VARIX N/A 04/01/2019    Procedure: UGI ENDO; W/BAND LIG ESOPH &/OR GASTRIC VARICES;  Surgeon: Beverly Milch, MD;  [facility]: [facility];  Service: Gastroenterology       No current facility-administered medications for this encounter.    Current Outpatient Medications:   ???  acetaminophen (TYLENOL) 325 MG tablet, Take 2 tablets (650 mg total) by mouth every six (6) hours as needed for pain or fever (> 100.4 F)., Disp: 100 tablet, Rfl: 0  ???  aspirin (ASPIR-81) 81 MG tablet, Take 1 tablet (81 mg total) by mouth daily., Disp: 100 tablet, Rfl: 10  ???  atovaquone (MEPRON) 750 mg/5 mL suspension, Holding script for now (1500mg  daily) as patient wanting to try pentamidine each month, Disp: 300 mL, Rfl: 5  ???  carvediloL (COREG) 25 MG tablet, Take 1 tablet (25 mg total) by mouth Two (2) times a day., Disp: 60 tablet, Rfl: 11  ???  ergocalciferol-1,250 mcg, 50,000 unit, (DRISDOL) 1,250 mcg (50,000 unit) capsule, Take 1 capsule (1,250 mcg total) by mouth once a week., Disp: 8 capsule, Rfl: 0  ???  fluticasone propionate (FLONASE) 50 mcg/actuation nasal spray, Use 1 spray into each nostril daily as needed for rhinitis., Disp: 16 g, Rfl: 5  ???  folic acid (FOLVITE) 1 MG tablet, Take 1 tablet (1 mg total) by mouth daily., Disp: 30 tablet, Rfl: 11  ???  loperamide (IMODIUM) 2 mg capsule, Take 2 capsules (4 mg total) by mouth Three (3) times a day as needed for diarrhea., Disp: 100 capsule, Rfl: 5  ???  magnesium oxide-Mg AA chelate (MAGNESIUM, AMINO ACID CHELATE,) 133 mg Tab, Take 1 tablet by mouth Two (2) times  a day. HOLD until directed to start by your coordinator., Disp: 100 tablet, Rfl: 11  ???  melatonin 3 mg Tab, Take 1 tablet (3 mg total) by mouth every evening., Disp: 100 tablet, Rfl: 3  ???  multivit with min #53-[id number] (DEKAS PLUS, FOLIC ACID,) 200 mcg-1,000 TDV-76 mg cap, Take 1 tablet by mouth. Take 1 tablet by mouth daily, Disp: , Rfl:   ???  mycophenolate (MYFORTIC) 180 MG EC tablet, Take 1 tablet (180 mg total) by mouth Two (2) times a day., Disp: 60 tablet, Rfl: 11  ???  NON FORMULARY, Take 500 mg by mouth daily with evening meal. Vitafusion Calcium: Take two gummies by mouth daily, Disp: , Rfl:   ???  OLANZapine (ZYPREXA) 5 MG tablet, Take 1 tablet (5 mg total) by mouth nightly., Disp: 30 tablet, Rfl: 11  ???  omeprazole (PRILOSEC) 40 MG capsule, Take 1 capsule (40 mg total) by mouth daily., Disp: 30 capsule, Rfl: 5  ???  ondansetron (ZOFRAN-ODT) 4 MG disintegrating tablet, Dissolve 1 tablet on the tongue every twelve (12) hours as needed for nausea or vomiting for up to 7 doses., Disp: 7 tablet, Rfl: 0  ???  oxyCODONE (ROXICODONE) 5 MG immediate release tablet, Take 1-2 tablets (5-10 mg total) by mouth every six (6) hours as needed for pain., Disp: 40 tablet, Rfl: 0  ??? pentamidine in sterile water (PF), Inhale 6 mL (300 mg total) every twenty-eight (28) days., Disp: 6 mL, Rfl: 5  ???  predniSONE (DELTASONE) 5 MG tablet, Take 1 tablet (5 mg total) by mouth daily., Disp: 30 tablet, Rfl: 11  ???  tacrolimus (PROGRAF) 1 MG capsule, Take 5mg  in the morning and 4mg  at night (held 10/2 pm dose and 10/3 am & pm dose), Disp: 270 capsule, Rfl: 11  ???  ursodioL (ACTIGALL) 300 mg capsule, Take 1 capsule (300 mg total) by mouth Three (3) times a day., Disp: 90 capsule, Rfl: 11  ???  valGANciclovir (VALCYTE) 450 mg tablet, Take 1 tablet (450 mg total) by mouth daily., Disp: 30 tablet, Rfl: 2    Allergies  Codeine, Erythromycin, and Fentanyl    History reviewed. No pertinent family history.    Social History  Social History     Tobacco Use   ??? Smoking status: Never Smoker   ??? Smokeless tobacco: Never Used   Substance Use Topics   ??? Alcohol use: Not Currently   ??? Drug use: Not Currently       Review of Systems    Constitutional: + fatigue, anorexia and fever.  Eyes: Negative for visual changes.  ENT: Negative for sore throat.  Cardiovascular: Negative for chest pain.  Respiratory: Negative for shortness of breath.  Gastrointestinal: + intermittent abdominal pain, nausea and diarrhea.  Genitourinary: Negative for dysuria. + frequency   Musculoskeletal: Negative for back pain.  Skin: Negative for rash.  Neurological: Negative for focal weakness or numbness. + headaches       Physical Exam     ED Triage Vitals   Vital Signs Group      Temp 11/29/19 1227 36.4 ??C (97.5 ??F)      Temp Source 11/29/19 1428 Oral      Heart Rate 11/29/19 1227 80      SpO2 Pulse 11/29/19 1428 73      Heart Rate Source 11/29/19 1227 Monitor      Resp 11/29/19 1227 18      BP 11/29/19 1227 129/73      MAP (mmHg) 11/29/19  1428 87      BP Location 11/29/19 1227 Right arm      BP Method 11/29/19 1227 Automatic      Patient Position 11/29/19 1227 Sitting   SpO2 11/29/19 1227 98 %   O2 Flow Rate (L/min) --    O2 Device 11/29/19 1428 None (Room air)       Constitutional: Alert and oriented. Well appearing and in no distress.  Eyes: Conjunctivae are normal, no scleral icterus, PERRLA, EOMI  ENT       Head: Normocephalic and atraumatic.       Nose: No congestion.       Mouth/Throat: Mucous membranes are moist.       Neck: No stridor.  Cardiovascular: Normal rate, regular rhythm. Normal and symmetric distal pulses are present in all extremities.  Respiratory: Normal respiratory effort. Breath sounds are normal.  Gastrointestinal: Mild tenderness to palpation in suprapubic region. There is no CVA tenderness.  Musculoskeletal: Normal range of motion in all extremities.       Right lower leg: No tenderness or edema.       Left lower leg: No tenderness or edema.  Neurologic: Normal speech and language. No gross focal neurologic deficits are appreciated.  Skin: Abdominal incision dry and intact. No erythema, tenderness, or rash noted.  Psychiatric: Mood and affect are normal. Speech and behavior are normal.         Radiology     CXR    Impression:    Patchy right basilar opacity, which may represent atelectasis versus aspiration or infection. Small right-sided pleural effusion.     I evaluated this patient with Greenland Edwards, MSIV. I attest that I have reviewed the student note and that the components of the history of the present illness, the physical exam, and the assessment and plan documented were performed by me or were performed in my presence by the student where I verified the documentation and performed (or re-performed) the exam and medical decision making.            Sherryl Barters, MD  11/30/19 1807

## 2019-11-29 NOTE — Patient Outreach (Addendum)
[facility]) Care Management  11/29/2019  TYARA DASSOW May 12, 1970 [id number]   Care Coordination    Received voicemail message from Pincus Large, Transplant coordinator on 9/27, during time I was out of office, message discussed  patient receiving outpatient physical therapy, and going outpatient for lab work, inquiring regarding coordination assistance with getting labs drawn from portacath as she has outreached several infusion sites and home agencies without success.  Placed return call to Pincus Large on my return to work on today, able to leave a message for return call.    Addendum Outreach call to Remote health to speak with representative regarding [employer] employee eligibility for services after discharge for  Kidney transplant, needing lab draw via portacath.  Representative will return call after following up.      [name], RN, BSN  [place] Management Coordinator  [phone]- Mobile [phone]- Toll Free Main Office

## 2019-11-30 ENCOUNTER — Ambulatory Visit: Payer: Self-pay | Admitting: *Deleted

## 2019-11-30 DIAGNOSIS — Z5181 Encounter for therapeutic drug level monitoring: Secondary | ICD-10-CM | POA: Diagnosis not present

## 2019-11-30 DIAGNOSIS — Z79899 Other long term (current) drug therapy: Secondary | ICD-10-CM | POA: Diagnosis not present

## 2019-11-30 DIAGNOSIS — Z944 Liver transplant status: Secondary | ICD-10-CM | POA: Diagnosis not present

## 2019-11-30 LAB — CBC
HEMOGLOBIN: 8.2 g/dL — ABNORMAL LOW (ref 12.0–16.0)
MEAN CORPUSCULAR HEMOGLOBIN CONC: 33.2 g/dL (ref 31.0–37.0)
MEAN CORPUSCULAR HEMOGLOBIN: 33.3 pg (ref 26.0–34.0)
MEAN CORPUSCULAR VOLUME: 100.3 fL — ABNORMAL HIGH (ref 80.0–100.0)
MEAN PLATELET VOLUME: 8.4 fL (ref 7.0–10.0)
RED BLOOD CELL COUNT: 2.47 10*12/L — ABNORMAL LOW (ref 4.00–5.20)
WBC ADJUSTED: 4.2 10*9/L — ABNORMAL LOW (ref 4.5–11.0)

## 2019-11-30 LAB — BASIC METABOLIC PANEL
BLOOD UREA NITROGEN: 16 mg/dL (ref 9–23)
BUN / CREAT RATIO: 13
CALCIUM: 8.4 mg/dL — ABNORMAL LOW (ref 8.7–10.4)
CHLORIDE: 111 mmol/L — ABNORMAL HIGH (ref 98–107)
CO2: 20 mmol/L (ref 20.0–31.0)
CREATININE: 1.28 mg/dL — ABNORMAL HIGH
EGFR CKD-EPI AA FEMALE: 57 mL/min/{1.73_m2} — ABNORMAL LOW (ref >=60)
EGFR CKD-EPI NON-AA FEMALE: 49 mL/min/{1.73_m2} — ABNORMAL LOW (ref >=60)
GLUCOSE RANDOM: 87 mg/dL (ref 70–179)
POTASSIUM: 4.2 mmol/L (ref 3.4–4.5)
SODIUM: 139 mmol/L (ref 135–145)

## 2019-11-30 LAB — HEPATIC FUNCTION PANEL
ALBUMIN: 3 g/dL — ABNORMAL LOW (ref 3.4–5.0)
ALKALINE PHOSPHATASE: 394 U/L — ABNORMAL HIGH (ref 46–116)
ALT (SGPT): 25 U/L (ref 10–49)
BILIRUBIN TOTAL: 0.5 mg/dL (ref 0.3–1.2)
PROTEIN TOTAL: 5.6 g/dL — ABNORMAL LOW (ref 5.7–8.2)

## 2019-11-30 LAB — TACROLIMUS, TROUGH: Lab: 11.5

## 2019-11-30 LAB — PHOSPHORUS: Phosphate:MCnc:Pt:Ser/Plas:Qn:: 3.3

## 2019-11-30 LAB — MAGNESIUM
MAGNESIUM: 1.4 mg/dL — ABNORMAL LOW (ref 1.6–2.6)
Magnesium:MCnc:Pt:Ser/Plas:Qn:: 1.4 — ABNORMAL LOW

## 2019-11-30 LAB — EGFR CKD-EPI AA FEMALE
Glomerular filtration rate/1.73 sq M.predicted.black:ArVRat:Pt:Ser/Plas/Bld:Qn:Creatinine-based formula (CKD-EPI): 57 — ABNORMAL LOW

## 2019-11-30 LAB — AST (SGOT): Aspartate aminotransferase:CCnc:Pt:Ser/Plas:Qn:: 18

## 2019-11-30 LAB — RED BLOOD CELL COUNT: Lab: 2.47 — ABNORMAL LOW

## 2019-11-30 MED ADMIN — carvediloL (COREG) tablet 25 mg: 25 mg | ORAL | @ 01:00:00

## 2019-11-30 MED ADMIN — acetaminophen (TYLENOL) tablet 650 mg: 650 mg | ORAL | @ 01:00:00 | Stop: 2019-11-29

## 2019-11-30 MED ADMIN — mycophenolate (MYFORTIC) EC tablet 180 mg: 180 mg | ORAL | @ 01:00:00

## 2019-11-30 MED ADMIN — valGANciclovir (VALCYTE) tablet 450 mg: 450 mg | ORAL | @ 16:00:00

## 2019-11-30 MED ADMIN — ursodioL (ACTIGALL) capsule 300 mg: 300 mg | ORAL | @ 01:00:00

## 2019-11-30 MED ADMIN — carvediloL (COREG) tablet 25 mg: 25 mg | ORAL | @ 13:00:00

## 2019-11-30 MED ADMIN — tacrolimus (PROGRAF) capsule 4 mg: 4 mg | ORAL | @ 01:00:00

## 2019-11-30 MED ADMIN — pantoprazole (PROTONIX) EC tablet 20 mg: 20 mg | ORAL | @ 01:00:00

## 2019-11-30 MED ADMIN — ursodioL (ACTIGALL) capsule 300 mg: 300 mg | ORAL | @ 13:00:00

## 2019-11-30 MED ADMIN — magnesium sulfate 2gm/50mL IVPB: 2 g | INTRAVENOUS | @ 15:00:00 | Stop: 2019-11-30

## 2019-11-30 MED ADMIN — OLANZapine (ZYPREXA) tablet 5 mg: 5 mg | ORAL | @ 01:00:00

## 2019-11-30 MED ADMIN — lactated Ringers infusion: 10 mL/h | INTRAVENOUS | @ 01:00:00

## 2019-11-30 MED ADMIN — cefTRIAXone (ROCEPHIN) 1 g in sodium chloride 0.9 % (NS) 100 mL IVPB-connector bag: 1 g | INTRAVENOUS | @ 01:00:00 | Stop: 2019-12-06

## 2019-11-30 MED ADMIN — loperamide (IMODIUM) capsule 2 mg: 2 mg | ORAL | @ 23:00:00

## 2019-11-30 MED ADMIN — predniSONE (DELTASONE) tablet 5 mg: 5 mg | ORAL | @ 13:00:00

## 2019-11-30 MED ADMIN — ursodioL (ACTIGALL) capsule 300 mg: 300 mg | ORAL | @ 19:00:00

## 2019-11-30 NOTE — Unmapped (Signed)
SRF TEACHING ROUNDS    An interdisciplinary care conference was held today and included the following team members: Jacqulyn Ducking, MD Transplant Surgeon , SRF Surgery Resident , Drake Leach, [place] Transplant Physician Assistant, Salem Senate, RN, MSN Transplant Nurse Coordinator and [employer], PharmD .    Per team, afebrile; LFT downtrending; diarrhea continues-GI path panel pending; cdiff neg; urine culture pending, pt c/o burning/discomfort urinating-ceftriaxone IV started; review CXR; remove staples at 6wk

## 2019-11-30 NOTE — Unmapped (Signed)
Transplant Surgery Progress Note    Hospital Day: 2    Assessment:     Kathy Armstrong is a 49 y.o. female with a PMH of OLT on 10/18/19 history of CAD, heart failure (last echo 8/21 with LVEF 60-65%), liver failure 2/2 PBC s/p OLT on 8/24/21who is now admitted for evaluation of intermittent fevers, found to have UTI on initial workup    Interval Events:   Admitted yesterday after presenting to the ED. Received 1L LR bolus, was started on IV Ceftriaxone. Reports incontinence and suprapubic pain at home, no sick contacts recently although her daughter has had a sore throat which she attributes to allergies. Continues to endorse diarrhea.    Plan:     Neuro:   - PRN Tylenol 650mg   CV:   - Coreg 25mg  BID   - holding home ASA  Pulm:   - Stable on room air  Continue incentive spirometry, pulmonary toilet, out of bed as tolerated   - 2V CXR this AM  GI:   - F: ML   - E: Replete PRN   - N: Continue regular diet   - GI: Protonix    -consider possibly starting Imodium   GU:   - Strict I/O   - IV Ceftriaxone for UTI    Heme/ID: Afebrile, with UTI on UA   - f/u blood cx, urine cx, GI pathogen panel   - C. Diff negative    Immuno:   - Tacrolimus, mycophenolate, prednisone   - Ursodiol   - Valcyte PPX    Psych: Zyprexa 5mg  qhs    - Dispo   - Floor    Objective:        Vital Signs:  BP 138/71  - Pulse 80  - Temp 36.9 ??C (Oral)  - Resp 16  - Ht 157.5 cm (5' 2)  - Wt 60.4 kg (133 lb 2.5 oz)  - SpO2 98%  - BMI 24.35 kg/m??     Input/Output:  I/O last 3 completed shifts:  In: -   Out: 500 [Urine:500]    Physical Exam:    General: Cooperative, no distress, well appearing female  Head: Normocephalic, atraumatic  Eyes: Pupil equal and round, conjunctiva not injected  Pulmonary: Normal work of breathing, equal bilateral chest rise  Cardiovascular: Regular rate  Abdomen: Soft, non-tender, non-distended; surgical incision well approximated with staples in place, two sutures lateral to incision on the right side  Musculoskeletal: Good range of motion in bilateral upper/lower extremities.  Neurologic: Alert and interactive, no motor abnormalities noted.  Labs:  Lab Results   Component Value Date    WBC 4.2 (L) 11/30/2019    HGB 8.2 (L) 11/30/2019    HCT 24.8 (L) 11/30/2019    PLT 128 (L) 11/30/2019       Lab Results   Component Value Date    NA 139 11/30/2019    K 4.2 11/30/2019    CL 111 (H) 11/30/2019    CO2 20.0 11/30/2019    BUN 16 11/30/2019    CREATININE 1.28 (H) 11/30/2019    CALCIUM 8.4 (L) 11/30/2019    MG 1.4 (L) 11/30/2019    PHOS 3.3 11/30/2019       Microbiology Results (last day)     Procedure Component Value Date/Time Date/Time    Clostridium difficile Assay [[id number]]  (Normal) Collected: 11/29/19 1551    Lab Status: Final result Specimen: Stool  Updated: 11/29/19 2236     C. Diff Result Negative  Comment: Clostridium difficile NOT detected       Narrative:      The methodology of this test detects C. difficile toxin A and/or toxin B, by EIA.      COVID-19 PCR [[id number]]     Lab Status: No result Specimen: Nasopharyngeal Swab     GI Pathogen Panel (instead of Stool O&P) [[id number]] Collected: 11/29/19 1551    Lab Status: In process Specimen: Stool  Updated: 11/29/19 1604    Urine Culture [[id number]] Collected: 11/29/19 1429    Lab Status: In process Specimen: Urine from Clean Catch Updated: 11/29/19 1449    Blood Culture #2 [[id number]] Collected: 11/29/19 1335    Lab Status: In process Specimen: Blood from 1 Peripheral Draw Updated: 11/29/19 1358    Blood Culture #1 [[id number]] Collected: 11/29/19 1335    Lab Status: In process Specimen: Blood from 1 Peripheral Draw Updated: 11/29/19 1357          Imaging:  All pertinent imaging personally reviewed.

## 2019-11-30 NOTE — Unmapped (Signed)
Social Work  Psychosocial Assessment    Patient Name: Kathy Armstrong   Medical Record Number: [id number]   Date of Birth: [birth year]  Sex: Female     Referral  Referred by: Care Manager  Reason for Referral: Complex Discharge Planning  No Psychosocial Interventions Necessary: No Psychosocial Interventions Necessary    Extended Emergency Contact Information  Primary Emergency Contact: Crutcher,Stephanie  Mobile Phone: [phone]  Relation: Sister  Preferred language: ENGLISH  Interpreter needed? No  Secondary Emergency Contact: Dodgen,Christopher   [place]  Home Phone: [phone]  Relation: Friend  Preferred language: ENGLISH  Interpreter needed? No    Legal Next of Kin / Guardian / POA / Advance Directives    HCDM (patient stated preference): Ostrum,Christopher - Friend [phone]    HCDM, First Alternate: Crutcher,Stephanie - Sister - [phone]    Advance Directive (Medical Treatment)  Does patient have an advance directive covering medical treatment?: Patient does not have advance directive covering medical treatment.  Reason patient does not have an advance directive covering medical treatment:: Patient does not wish to complete one at this time.    Health Care Decision Maker [HCDM] (Medical & Mental Health Treatment)  Healthcare Decision Maker: Patient does not wish to appoint a Health Care Decision Maker at this time  Information offered on HCDM, Medical & Mental Health advance directives:: Patient given information.    Advance Directive (Mental Health Treatment)  Does patient have an advance directive covering mental health treatment?: Patient does not have advance directive covering mental health treatment.  Reason patient does not have an advance directive covering mental health treatment:: Patient does not wish to complete one at this time.    Discharge Planning  Discharge Planning Information:   Type of Residence   Mailing Address:  [address] [place]  [place] [address]    Medical Information   Past Medical History:   Diagnosis Date   ??? CAD (coronary artery disease)    ??? Hiatal hernia    ??? Osteopenia    ??? Primary biliary cholangitis (CMS-HCC)        Past Surgical History:   Procedure Laterality Date   ??? CERVICAL BIOPSY  W/ LOOP ELECTRODE EXCISION     ??? CORONARY ARTERY BYPASS GRAFT     ??? PR RIGHT HEART CATH O2 SATURATION & CARDIAC OUTPUT N/A 08/12/2019    Procedure: Right Heart Catheterization;  Surgeon: Marlaine Hind, MD;  Location: [facility] CATH;  Service: Cardiology   ??? PR TRANSPLANT LIVER,ALLOTRANSPLANT Midline 10/18/2019    Procedure: Liver Allotransplantation; Orthotopic, Partial Or Whole, From Cadaver Or Living Donor, Any Age;  Surgeon: Loney Hering, MD;  Location: MAIN OR [facility];  Service: Transplant   ??? PR TRANSPLANT,PREP DONOR LIVER, WHOLE N/A 10/18/2019    Procedure: Rogelia Boga Std Prep Cad Donor Whole Liver Gft Prior Tnsplnt,Inc Chole,Diss/Rem Surr Tissu Wo Triseg/Lobe Splt;  Surgeon: Loney Hering, MD;  Location: MAIN OR [facility];  Service: Transplant   ??? PR TRANSPLANT,PREP DONOR LIVER/ARTERIAL N/A 10/18/2019    Procedure: BACKBNCH RECONSTRUCT OF CAD/LIVE DONOR LIVER GFT PRIOR TRANSPLANT; ARTERIAL ANASTAMOSIS, EA;  Surgeon: Loney Hering, MD;  Location: MAIN OR [facility];  Service: Transplant   ??? PR UPPER GI ENDOSCOPY,DIAGNOSIS  07/19/2019    Procedure: UGI ENDO, INCLUDE ESOPHAGUS, STOMACH, & DUODENUM &/OR JEJUNUM; DX W/WO COLLECTION SPECIMN, BY BRUSH OR WASH;  Surgeon: Pia Mau, MD;  [facility];  Service: Gastroenterology   ??? PR UPPER GI ENDOSCOPY,LIGAT VARIX N/A 04/01/2019  Procedure: UGI ENDO; W/BAND LIG ESOPH &/OR GASTRIC VARICES;  Surgeon: Beverly Milch, MD;  [facility];  Service: Gastroenterology       History reviewed. No pertinent family history.    Financial Information   Primary Insurance: Payor: UMR / Plan: UMR / Product Type: *No Product type* /    Secondary Insurance: None   Prescription Coverage: Nurse, learning disability (listed above)   Preferred Pharmacy: [facility] [place], [place] - [address]  [facility]  [facility]    Barriers to taking medication: No    Transition Home   Transportation at time of discharge: Family/Friend's Private Vehicle    Anticipated changes related to Illness: s/p liver transplant   Services in place prior to admission: outpatient PT/OT; has DME (rolling walker)   Services anticipated for DC: TBD   Hemodialysis Prior to Admission: No    Readmission  Risk of Unplanned Readmission Score: UNPLANNED READMISSION SCORE: 49%  Readmitted Within the Last 30 Days? Yes  Readmission Factors include: other: s/p liver transplant    Social Determinants of Health  Social Determinants of Health were addressed in provider documentation.  Please refer to patient history.    Social History  Support Systems/Concerns: Family Members, Parent, Transport planner Service: No Marine scientist and Psychiatric History  Psychosocial Stressors: Coping with health challenges/recent hospitalization    Psychological Issues/Information: No issues    Chemical Dependency: None    Outpatient Providers: Specialist   Name / Contact #: Conservator, museum/gallery [facility]     Legal: No legal issues    Ability to [employer]: No issues accessing community services

## 2019-11-30 NOTE — Unmapped (Signed)
Patient seen in clinic with Cecilie Lowers, PharmD and Dr. Doyne Keel. Reports that Saturday/weekend was good.   Eating small meals at a time. Denies vomiting. Trying to increase her fluid intake.   Before labs today she drank Gatorade and ate cheese toast. Diarrhea continues as it did inpatient with 4-5x a day with taking Imodium 2x a day. Headaches and tremors not as bad this past weekend.   At times, she feels like someone ???punched her in the gut.??? Dr. Sherryll Burger came in briefly to say hello and this coordinator mentioned this GI symptom to Dr. Sherryll Burger who told patient to increase her omeprazole to 40mg  daily. Patient verbalized understanding; passed this information to pharmacist.   Vernona Rieger mentioned until her stomach discomfort resolves that we may proceed with pentamidine instead of atovaquone (inpatient scheduled her for the next one mid-October).   Patient has endocrinologist who manages her bone health and a ???lipid cardiologist??? who manages cholesterol.   Reviewed labs with patient and noted magnesium is low; reported magnesium to Vernona Rieger who may start magnesium tablets.   Took tacrolimus at 8:30pm last night.   Discussed with patient that txp case manager tried again to obtain home health for her to draw labs from her port-a-cath and there were no agencies available to do this. To continue with [facility].   Called pt's [employer] employee case manager while in room with patient and left VM for Ander Purpura (ph: [phone]) asking if she knows an infusion center who would draw labs from her port-a-cath since we have not found a place that would do this and no home health available. Mentioned to patient that the case manager's VM mentioned she is out of the office this week and may hear back next week.   Vernona Rieger confirmed patient can do 2x a week labs; discussed with patient who verbalized understanding to have labs drawn 2x a week on Mondays and Thursdays.

## 2019-11-30 NOTE — Unmapped (Signed)
.  Patient rounding complete, call bell in reach, bed locked and in lowest position, patient belongings at bedside and within reach of patient.  Patient updated on plan of care.      Patient speaking on her cell phone at this time.

## 2019-11-30 NOTE — Unmapped (Signed)
Admitted to unit from ED. Oriented to room and admission questions were completed. Sister at bedside for half of shift. IV antibiotic given. Vitals w/in provider's parameters. Abdominal incision c/d/I w/ staples. Ambulating with no issues. MD paged for pt c/o headache; tylenol was given w/ relief.   Problem: Adult Inpatient Plan of Care  Goal: Plan of Care Review  Outcome: Progressing  Goal: Patient-Specific Goal (Individualized)  Outcome: Progressing  Goal: Absence of Hospital-Acquired Illness or Injury  Outcome: Progressing  Intervention: Identify and Manage Fall Risk  Recent Flowsheet Documentation  Taken 11/30/2019 0100 by Judeen Hammans, RN  Safety Interventions:   low bed   lighting adjusted for tasks/safety   fall reduction program maintained   environmental modification   infection management  Taken 11/29/2019 2310 by Judeen Hammans, RN  Safety Interventions:   low bed   lighting adjusted for tasks/safety   fall reduction program maintained   environmental modification   infection management  Taken 11/29/2019 2106 by Judeen Hammans, RN  Safety Interventions:   low bed   lighting adjusted for tasks/safety   infection management   family at bedside   fall reduction program maintained   environmental modification  Taken 11/29/2019 1918 by Judeen Hammans, RN  Safety Interventions:   low bed   lighting adjusted for tasks/safety   infection management   family at bedside   fall reduction program maintained   environmental modification  Goal: Optimal Comfort and Wellbeing  Outcome: Progressing  Goal: Readiness for Transition of Care  Outcome: Progressing  Goal: Rounds/Family Conference  Outcome: Progressing     Problem: Adult Inpatient Plan of Care  Goal: Optimal Comfort and Wellbeing  Outcome: Progressing     Problem: Impaired Wound Healing  Goal: Optimal Wound Healing  Outcome: Progressing     Problem: UTI (Urinary Tract Infection)  Goal: Improved Infection Symptoms  Outcome: Progressing

## 2019-12-01 ENCOUNTER — Other Ambulatory Visit (HOSPITAL_COMMUNITY): Payer: Self-pay

## 2019-12-01 ENCOUNTER — Ambulatory Visit: Payer: 59 | Admitting: Physical Therapy

## 2019-12-01 DIAGNOSIS — Z944 Liver transplant status: Principal | ICD-10-CM

## 2019-12-01 DIAGNOSIS — Z79899 Other long term (current) drug therapy: Principal | ICD-10-CM

## 2019-12-01 LAB — HEPATIC FUNCTION PANEL
ALBUMIN: 3 g/dL — ABNORMAL LOW (ref 3.4–5.0)
ALT (SGPT): 44 U/L (ref 10–49)
AST (SGOT): 50 U/L — ABNORMAL HIGH (ref <=34)
BILIRUBIN DIRECT: 0.3 mg/dL (ref 0.00–0.30)
PROTEIN TOTAL: 5.6 g/dL — ABNORMAL LOW (ref 5.7–8.2)

## 2019-12-01 LAB — BASIC METABOLIC PANEL
ANION GAP: 7 mmol/L (ref 5–14)
BLOOD UREA NITROGEN: 13 mg/dL (ref 9–23)
BUN / CREAT RATIO: 12
CALCIUM: 8.5 mg/dL — ABNORMAL LOW (ref 8.7–10.4)
CHLORIDE: 108 mmol/L — ABNORMAL HIGH (ref 98–107)
CREATININE: 1.06 mg/dL — ABNORMAL HIGH
EGFR CKD-EPI AA FEMALE: 71 mL/min/{1.73_m2} (ref >=60)
EGFR CKD-EPI NON-AA FEMALE: 62 mL/min/{1.73_m2} (ref >=60)
GLUCOSE RANDOM: 91 mg/dL (ref 70–179)
SODIUM: 137 mmol/L (ref 135–145)

## 2019-12-01 LAB — MAGNESIUM: Magnesium:MCnc:Pt:Ser/Plas:Qn:: 2.5

## 2019-12-01 LAB — CBC
HEMATOCRIT: 24.5 % — ABNORMAL LOW (ref 36.0–46.0)
MEAN CORPUSCULAR HEMOGLOBIN CONC: 33.7 g/dL (ref 31.0–37.0)
MEAN CORPUSCULAR HEMOGLOBIN: 33.6 pg (ref 26.0–34.0)
MEAN CORPUSCULAR VOLUME: 99.9 fL (ref 80.0–100.0)
PLATELET COUNT: 133 10*9/L — ABNORMAL LOW (ref 150–440)
RED BLOOD CELL COUNT: 2.45 10*12/L — ABNORMAL LOW (ref 4.00–5.20)
RED CELL DISTRIBUTION WIDTH: 18.7 % — ABNORMAL HIGH (ref 12.0–15.0)
WBC ADJUSTED: 2.8 10*9/L — ABNORMAL LOW (ref 4.5–11.0)

## 2019-12-01 LAB — HEMOGLOBIN: Hemoglobin:MCnc:Pt:Bld:Qn:: 8.2 — ABNORMAL LOW

## 2019-12-01 LAB — ANION GAP: Anion gap 3:SCnc:Pt:Ser/Plas:Qn:: 7

## 2019-12-01 LAB — PHOSPHORUS: Phosphate:MCnc:Pt:Ser/Plas:Qn:: 3.6

## 2019-12-01 LAB — TACROLIMUS, TROUGH: Lab: 11.4

## 2019-12-01 LAB — ALT (SGPT): Alanine aminotransferase:CCnc:Pt:Ser/Plas:Qn:: 44

## 2019-12-01 MED ORDER — VALGANCICLOVIR 450 MG TABLET
ORAL_TABLET | Freq: Every day | ORAL | 2 refills | 30 days | Status: CP
Start: 2019-12-01 — End: 2020-02-29

## 2019-12-01 MED ADMIN — ursodioL (ACTIGALL) capsule 300 mg: 300 mg | ORAL

## 2019-12-01 MED ADMIN — OLANZapine (ZYPREXA) tablet 5 mg: 5 mg | ORAL

## 2019-12-01 MED ADMIN — carvediloL (COREG) tablet 25 mg: 25 mg | ORAL | @ 13:00:00

## 2019-12-01 MED ADMIN — loperamide (IMODIUM) capsule 4 mg: 4 mg | ORAL | @ 17:00:00

## 2019-12-01 MED ADMIN — acetaminophen (TYLENOL) tablet 650 mg: 650 mg | ORAL | @ 17:00:00

## 2019-12-01 MED ADMIN — loperamide (IMODIUM) capsule 4 mg: 4 mg | ORAL | @ 22:00:00

## 2019-12-01 MED ADMIN — mycophenolate (MYFORTIC) EC tablet 180 mg: 180 mg | ORAL | @ 03:00:00

## 2019-12-01 MED ADMIN — tacrolimus (PROGRAF) capsule 5 mg: 5 mg | ORAL | @ 13:00:00 | Stop: 2019-12-01

## 2019-12-01 MED ADMIN — cephalexin (KEFLEX) capsule 500 mg: 500 mg | ORAL | @ 17:00:00 | Stop: 2019-12-07

## 2019-12-01 NOTE — Unmapped (Signed)
Met with patient and she reports feeling better than before transplant, however would like to get diarrhea under control. Reviewed with her plan from rounds-scheduled loperamide, transition to PO keflex for her UTI and probable discharge tomorrow with follow up in clinic Monday. Pt confirmed her staples were removed. She inquired about her bump in liver enzymes and assured patient her labs would be checked again tomorrow, sometimes with stress and infection can see a rise in LFT's. Pt appreciative of visit.

## 2019-12-01 NOTE — Unmapped (Signed)
VSS. A+Ox4. IV abx given as ordered. No c/o pain. Pt. Asleep overnight with no acute events.     Problem: Adult Inpatient Plan of Care  Goal: Plan of Care Review  Outcome: Progressing  Goal: Patient-Specific Goal (Individualized)  Outcome: Progressing  Goal: Absence of Hospital-Acquired Illness or Injury  Outcome: Progressing  Intervention: Identify and Manage Fall Risk  Recent Flowsheet Documentation  Taken 11/30/2019 1901 by Simone Curia, RN  Safety Interventions:   fall reduction program maintained   family at bedside   nonskid shoes/slippers when out of bed  Goal: Optimal Comfort and Wellbeing  Outcome: Progressing     Problem: Impaired Wound Healing  Goal: Optimal Wound Healing  Outcome: Progressing     Problem: UTI (Urinary Tract Infection)  Goal: Improved Infection Symptoms  Outcome: Progressing

## 2019-12-01 NOTE — Unmapped (Signed)
Plan of care reviewed with pt, all questions answered. Pt's sister is at the bedside. COVID swab collected from pt. VSS, afebrile. Pt requested imodium for diarrhea. Pt is calm and comfortable will continue to monitor.     Problem: Adult Inpatient Plan of Care  Goal: Plan of Care Review  Outcome: Progressing  Goal: Patient-Specific Goal (Individualized)  Outcome: Progressing  Goal: Absence of Hospital-Acquired Illness or Injury  Outcome: Progressing  Intervention: Identify and Manage Fall Risk  Recent Flowsheet Documentation  Taken 11/30/2019 0715 by Carmell Austria, RN  Safety Interventions:   fall reduction program maintained   environmental modification   lighting adjusted for tasks/safety   low bed   nonskid shoes/slippers when out of bed  Goal: Optimal Comfort and Wellbeing  Outcome: Progressing  Goal: Readiness for Transition of Care  Outcome: Progressing  Goal: Rounds/Family Conference  Outcome: Progressing     Problem: Impaired Wound Healing  Goal: Optimal Wound Healing  Outcome: Progressing     Problem: UTI (Urinary Tract Infection)  Goal: Improved Infection Symptoms  Outcome: Progressing

## 2019-12-01 NOTE — Unmapped (Signed)
Tacrolimus Therapeutic Monitoring Pharmacy Note    Emalyn Hilleary is a 49 y.o. female starting tacrolimus.     Indication: Kidney transplant     Date of Transplant: 10/18/19      Prior Dosing Information: Home regimen 5 mg QAM and 4 mg QPM     Goals:  Therapeutic Drug Levels  Tacrolimus trough goal: 8-10 ng/mL    Additional Clinical Monitoring/Outcomes  ?? Monitor renal function (SCr and urine output) and liver function (LFTs)  ?? Monitor for signs/symptoms of adverse events (e.g., hyperglycemia, hyperkalemia, hypomagnesemia, hypertension, headache, tremor)    Results:   Tacrolimus level: 11.4 ng/mL, drawn appropriately    Pharmacokinetic Considerations and Significant Drug Interactions:  ??? Concurrent hepatotoxic medications: None identified  ??? Concurrent CYP3A4 substrates/inhibitors: None identified  ??? Concurrent nephrotoxic medications: None identified    Assessment/Plan:  Recommendedation(s)  ??? Decrease to 4 mg BID    Follow-up  ??? Daily levels ordered.   ??? A pharmacist will continue to monitor and recommend levels as appropriate    Please page service pharmacist with questions/clarifications.    Vertis Kelch, PharmD

## 2019-12-01 NOTE — Unmapped (Cosign Needed)
Transplant Surgery Progress Note    Hospital Day: 3    Assessment:     Kathy Armstrong is a 49 y.o. 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    Interval Events:     Feels improved. Urine culture grew E coli, sensitivities returned. Continues to report diarrhea.    Plan:     Neuro:   - PRN Tylenol 650mg   CV:   - Coreg 25mg  BID   - Holding home ASA  Pulm:   - Stable on room air   - Continue incentive spirometry, pulmonary toilet, out of bed as tolerated    GI:   - F: ML   - E: Replete PRN   - N: Continue regular diet   - GI: Protonix   - Imodium 4mg  TID with meals  GU:   - Strict I/O   - Switch to Keflex today for UTI    Heme/ID: Afebrile, with UTI on UA   - GI pathogen panel and C diff negative   - Switch to Keflex today for UTI    Immuno:   - Tacrolimus, mycophenolate, prednisone   - Ursodiol   - Valcyte PPX    Psych: Zyprexa 5mg  qhs    - Dispo   - Floor    Objective:        Vital Signs:  BP 156/77  - Pulse 69  - Temp 36.6 ??C (Oral)  - Resp 16  - Ht 157.5 cm (5' 2)  - Wt 60.4 kg (133 lb 2.5 oz)  - SpO2 99%  - BMI 24.35 kg/m??     Input/Output:  I/O last 3 completed shifts:  In: 207 [I.V.:207]  Out: 2900 [Urine:2900]    Physical Exam:    General: Cooperative, no distress, well appearing female  Pulmonary: Normal work of breathing, equal bilateral chest rise  Cardiovascular: Regular rate  Abdomen: Soft, non-tender, non-distended; surgical incision well approximated, steri-strips in place  Musculoskeletal: Good range of motion in bilateral upper/lower extremities.  Neurologic: Alert and interactive, no motor abnormalities noted.    Labs:  Lab Results   Component Value Date    WBC 2.8 (L) 12/01/2019    HGB 8.2 (L) 12/01/2019    HCT 24.5 (L) 12/01/2019    PLT 133 (L) 12/01/2019       Lab Results   Component Value Date    NA 137 12/01/2019    K 4.0 12/01/2019    CL 108 (H) 12/01/2019    CO2 22.0 12/01/2019    BUN 13 12/01/2019    CREATININE 1.06 (H) 12/01/2019    CALCIUM 8.5 (L) 12/01/2019    MG 2.5 12/01/2019    PHOS 3.6 12/01/2019       Microbiology Results (last day)     Procedure Component Value Date/Time Date/Time    Clostridium difficile Assay [[id number]]  (Normal) Collected: 11/29/19 1551    Lab Status: Final result Specimen: Stool  Updated: 11/29/19 2236     C. Diff Result Negative     Comment: Clostridium difficile NOT detected       Narrative:      The methodology of this test detects C. difficile toxin A and/or toxin B, by EIA.      COVID-19 PCR [[id number]]     Lab Status:  No result Specimen: Nasopharyngeal Swab     GI Pathogen Panel (instead of Stool O&P) [[id number]] Collected: 11/29/19 1551    Lab Status: In process Specimen: Stool  Updated: 11/29/19 1604    Urine Culture [[id number]] Collected: 11/29/19 1429    Lab Status: In process Specimen: Urine from Clean Catch Updated: 11/29/19 1449    Blood Culture #2 [[id number]] Collected: 11/29/19 1335    Lab Status: In process Specimen: Blood from 1 Peripheral Draw Updated: 11/29/19 1358    Blood Culture #1 [[id number]] Collected: 11/29/19 1335    Lab Status: In process Specimen: Blood from 1 Peripheral Draw Updated: 11/29/19 1357          Imaging:  All pertinent imaging personally reviewed.

## 2019-12-01 NOTE — Unmapped (Signed)
Received notification from [facility] pharmacy that they cannot provide pt's valcyte but the [facility] pharmacy can provide this.    Sent script for valcyte to [facility] and sent a message to inpatient coordinator to let the patient know who is currently admitted.

## 2019-12-02 LAB — HEPATIC FUNCTION PANEL
ALBUMIN: 2.8 g/dL — ABNORMAL LOW (ref 3.4–5.0)
ALKALINE PHOSPHATASE: 634 U/L — ABNORMAL HIGH (ref 46–116)
ALT (SGPT): 53 U/L — ABNORMAL HIGH (ref 10–49)
AST (SGOT): 49 U/L — ABNORMAL HIGH (ref <=34)
BILIRUBIN TOTAL: 0.5 mg/dL (ref 0.3–1.2)

## 2019-12-02 LAB — TACROLIMUS, TROUGH: Lab: 10.9

## 2019-12-02 LAB — BASIC METABOLIC PANEL
ANION GAP: 8 mmol/L (ref 5–14)
BUN / CREAT RATIO: 12
CALCIUM: 7.9 mg/dL — ABNORMAL LOW (ref 8.7–10.4)
CHLORIDE: 110 mmol/L — ABNORMAL HIGH (ref 98–107)
CO2: 21 mmol/L (ref 20.0–31.0)
CREATININE: 0.98 mg/dL
EGFR CKD-EPI AA FEMALE: 78 mL/min/{1.73_m2} (ref >=60)
EGFR CKD-EPI NON-AA FEMALE: 68 mL/min/{1.73_m2} (ref >=60)
GLUCOSE RANDOM: 85 mg/dL (ref 70–179)
POTASSIUM: 3.8 mmol/L (ref 3.4–4.5)

## 2019-12-02 LAB — CBC
HEMATOCRIT: 24.2 % — ABNORMAL LOW (ref 36.0–46.0)
HEMOGLOBIN: 8.3 g/dL — ABNORMAL LOW (ref 12.0–16.0)
MEAN CORPUSCULAR HEMOGLOBIN CONC: 34.3 g/dL (ref 31.0–37.0)
MEAN CORPUSCULAR HEMOGLOBIN: 34.2 pg — ABNORMAL HIGH (ref 26.0–34.0)
MEAN CORPUSCULAR VOLUME: 99.7 fL (ref 80.0–100.0)
PLATELET COUNT: 152 10*9/L (ref 150–440)
RED BLOOD CELL COUNT: 2.43 10*12/L — ABNORMAL LOW (ref 4.00–5.20)
RED CELL DISTRIBUTION WIDTH: 18.3 % — ABNORMAL HIGH (ref 12.0–15.0)

## 2019-12-02 LAB — MAGNESIUM: Magnesium:MCnc:Pt:Ser/Plas:Qn:: 2.1

## 2019-12-02 LAB — ALBUMIN: Albumin:MCnc:Pt:Ser/Plas:Qn:: 2.8 — ABNORMAL LOW

## 2019-12-02 LAB — RED CELL DISTRIBUTION WIDTH: Lab: 18.3 — ABNORMAL HIGH

## 2019-12-02 LAB — GLUCOSE RANDOM: Glucose:MCnc:Pt:Ser/Plas:Qn:: 85

## 2019-12-02 LAB — PHOSPHORUS: Phosphate:MCnc:Pt:Ser/Plas:Qn:: 3.7

## 2019-12-02 MED ORDER — TACROLIMUS 1 MG CAPSULE, IMMEDIATE-RELEASE
ORAL_CAPSULE | Freq: Two times a day (BID) | ORAL | 11 refills | 34 days
Start: 2019-12-02 — End: ?

## 2019-12-02 MED ORDER — CEPHALEXIN 500 MG CAPSULE
ORAL_CAPSULE | Freq: Four times a day (QID) | ORAL | 0 refills | 6.00000 days | Status: CP
Start: 2019-12-02 — End: 2019-12-08
  Filled 2019-12-02: qty 24, 6d supply, fill #0

## 2019-12-02 MED ORDER — LOPERAMIDE 2 MG CAPSULE
ORAL_CAPSULE | Freq: Three times a day (TID) | ORAL | 5 refills | 17 days | Status: CP | PRN
Start: 2019-12-02 — End: ?

## 2019-12-02 MED ADMIN — mycophenolate (MYFORTIC) EC tablet 180 mg: 180 mg | ORAL | @ 01:00:00

## 2019-12-02 MED ADMIN — cephalexin (KEFLEX) capsule 500 mg: 500 mg | ORAL | @ 17:00:00 | Stop: 2019-12-02

## 2019-12-02 MED ADMIN — pantoprazole (PROTONIX) EC tablet 40 mg: 40 mg | ORAL | @ 13:00:00 | Stop: 2019-12-02

## 2019-12-02 MED ADMIN — heparin, porcine (PF) 100 unit/mL injection 500 Units: 500 [IU] | INTRAVENOUS | @ 17:00:00 | Stop: 2019-12-02

## 2019-12-02 MED ADMIN — ursodioL (ACTIGALL) capsule 300 mg: 300 mg | ORAL | @ 01:00:00

## 2019-12-02 MED ADMIN — ursodioL (ACTIGALL) capsule 300 mg: 300 mg | ORAL | @ 19:00:00 | Stop: 2019-12-02

## 2019-12-02 MED ADMIN — predniSONE (DELTASONE) tablet 5 mg: 5 mg | ORAL | @ 13:00:00 | Stop: 2019-12-02

## 2019-12-02 MED ADMIN — tacrolimus (PROGRAF) capsule 4 mg: 4 mg | ORAL | @ 13:00:00 | Stop: 2019-12-02

## 2019-12-02 MED ADMIN — loperamide (IMODIUM) capsule 4 mg: 4 mg | ORAL | @ 17:00:00 | Stop: 2019-12-02

## 2019-12-02 MED FILL — CEPHALEXIN 500 MG CAPSULE: 6 days supply | Qty: 24 | Fill #0 | Status: AC

## 2019-12-02 NOTE — Unmapped (Signed)
Patient seen by [facility] team  this shift and determined to be discharged home. VSS and patient remains afebrile. Patient voiding in bathroom with clear yellow urine noted. Denies pain. Appetite fair, no complaints of nausea and no reported vomiting. Discharge instructions given and indicates understanding. Patient discharged home as ordered. Left the unit with her personal belongings accompanied by sister.  Problem: Adult Inpatient Plan of Care  Goal: Plan of Care Review  Outcome: Discharged to Home  Goal: Patient-Specific Goal (Individualized)  Outcome: Discharged to Home  Goal: Absence of Hospital-Acquired Illness or Injury  Outcome: Discharged to Home  Intervention: Identify and Manage Fall Risk  Recent Flowsheet Documentation  Taken 12/02/2019 0900 by Maren Beach, RN  Safety Interventions: low bed  Intervention: Prevent and Manage VTE (Venous Thromboembolism) Risk  Recent Flowsheet Documentation  Taken 12/02/2019 0900 by Maren Beach, RN  Activity Management: up ad lib  Goal: Optimal Comfort and Wellbeing  Outcome: Discharged to Home  Goal: Readiness for Transition of Care  Outcome: Discharged to Home  Goal: Rounds/Family Conference  Outcome: Discharged to Home     Problem: Impaired Wound Healing  Goal: Optimal Wound Healing  Outcome: Discharged to Home  Intervention: Promote Wound Healing  Recent Flowsheet Documentation  Taken 12/02/2019 0900 by Maren Beach, RN  Activity Management: up ad lib     Problem: UTI (Urinary Tract Infection)  Goal: Improved Infection Symptoms  Outcome: Discharged to Home     Problem: Heart Failure Comorbidity  Goal: Maintenance of Heart Failure Symptom Control  Outcome: Discharged to Home     Problem: Hypertension Comorbidity  Goal: Blood Pressure in Desired Range  Outcome: Discharged to Home

## 2019-12-02 NOTE — Unmapped (Addendum)
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    In the ED, patient was afebrile and hemodynamically stable. Workup was notable for normal WBC. Cr 1.31, downtrending from previous labs. Tbili 0.7, AST 36, ALT 35, alk phos 519. UA notable for small leuk esterase, WBC 12, rare bacteria. Urine culture collected.    Patient was admitted for IV antibiotics, started on ceftriaxone.  Further infectious work-up was pursued.  Chest x-ray revealed no significant pneumonia.  C. difficile and GI pathogen panel negative.  Blood cultures collected on 10/5 have resulted in no growth at the time of discharge.  COVID-19 PCR negative.  Her urine cultures revealed E. Coli susceptible to Keflex.  She was transitioned to PO Keflex on 10/7 and will continue this for a total antibiotic course of 10 days.  Her diarrhea was managed with scheduled loperamide.  Her staples from her abdominal incisions were removed during this hospital course.    Patient was afebrile and hemodynamically stable, tolerating PO intake and reporting improved diarrhea at the time of discharge on 12/02/19.  She will return to transplant clinic on 12/05/2019.

## 2019-12-02 NOTE — Unmapped (Signed)
VSS. A+Ox4. PO abx given as ordered. No acute events overnight. Bed is low, locked, and call bell is within reach.   Problem: Adult Inpatient Plan of Care  Goal: Plan of Care Review  Outcome: Progressing  Goal: Patient-Specific Goal (Individualized)  Outcome: Progressing  Goal: Absence of Hospital-Acquired Illness or Injury  Outcome: Progressing  Intervention: Identify and Manage Fall Risk  Recent Flowsheet Documentation  Taken 12/01/2019 1901 by Simone Curia, RN  Safety Interventions:   low bed   lighting adjusted for tasks/safety   fall reduction program maintained  Goal: Optimal Comfort and Wellbeing  Outcome: Progressing     Problem: UTI (Urinary Tract Infection)  Goal: Improved Infection Symptoms  Outcome: Progressing

## 2019-12-02 NOTE — Unmapped (Signed)
Discharge Summary    Admit date: 11/29/2019    Discharge date and time: 12/02/19    Discharge to:  Home    Discharge Service: Surg Transplant [facility])    Discharge Attending Physician: Phillips Grout Des*    Discharge  Diagnoses: UTI    Secondary Diagnosis: Active Problems:    * No active hospital problems. *  Resolved Problems:    * No resolved hospital problems. *      OR Procedures:  N/A     Ancillary Procedures: no procedures    Discharge Day Services:     Subjective   No acute events overnight. Pain Controlled. No fever or chills. Reports improvement in her diarrhea after scheduling loperamide, only 2 BMs yesterday.     Objective   Patient Vitals for the past 8 hrs:   BP Temp Temp src Pulse Resp SpO2   12/02/19 0831 135/75 36.7 ??C Oral 73 16 99 %     I/O this shift:  In: -   Out: 800 [Urine:800]    Physical Exam  General: Cooperative, no distress, well appearing female  Pulmonary: Normal work of breathing, equal bilateral chest rise  Cardiovascular: Regular rate  Abdomen: Soft, non-tender, non-distended; surgical incision well approximated, steri-strips in place  Musculoskeletal: Good range of motion in bilateral upper/lower extremities.  Neurologic: Alert and interactive, no motor abnormalities noted.    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    In the ED, patient was afebrile and hemodynamically stable. Workup was notable for normal WBC. Cr 1.31, downtrending from previous labs. Tbili 0.7, AST 36, ALT 35, alk phos 519. UA notable for small leuk esterase, WBC 12, rare bacteria. Urine culture collected.    Patient was admitted for IV antibiotics, started on ceftriaxone.  Further infectious work-up was pursued.  Chest x-ray revealed no significant pneumonia.  C. difficile and GI pathogen panel negative.  Blood cultures collected on 10/5 have resulted in no growth at the time of discharge.  COVID-19 PCR negative.  Her urine cultures revealed E. Coli susceptible to Keflex.  She was transitioned to PO Keflex on 10/7 and will continue this for a total antibiotic course of 10 days.  Her diarrhea was managed with scheduled loperamide.  Her staples from her abdominal incisions were removed during this hospital course.    Patient was afebrile and hemodynamically stable, tolerating PO intake and reporting improved diarrhea at the time of discharge on 12/02/19.  She will return to transplant clinic on 12/05/2019.      Condition at Discharge: Improved  Discharge Medications:      Medication List      START taking these medications    ??? cephalexin 500 MG capsule; Commonly known as: KEFLEX; Take 1 capsule   (500 mg total) by mouth Every six (6) hours for 6 days.     CHANGE how you take these medications    ??? tacrolimus 1 MG capsule; Commonly known as: PROGRAF; Take 4 capsules (4   mg total) by mouth two (2) times a day.; What changed: how much to take,   how to take this, when to take this, additional instructions     CONTINUE taking these medications    ??? acetaminophen 325 MG tablet; Commonly known as: TYLENOL; Take 2 tablets   (  650 mg total) by mouth every six (6) hours as needed for pain or fever (>   100.4 F).  ??? aspirin 81 MG tablet; Commonly known as: ASPIR-81; Take 1 tablet (81 mg   total) by mouth daily.  ??? atovaquone 750 mg/5 mL suspension; Commonly known as: MEPRON; Holding   script for now (1500mg  daily) as patient wanting to try pentamidine each   month  ??? carvediloL 25 MG tablet; Commonly known as: COREG; Take 1 tablet (25 mg   total) by mouth Two (2) times a day.  ??? DEKAS PLUS (FOLIC ACID) 200 mcg-1,000 mcg-10 mg Cap; Generic drug:   multivit with min #[id number]  ??? ergocalciferol-1,250 mcg (50,000 unit) 1,250 mcg (50,000 unit) capsule;   Commonly known as: DRISDOL; Take 1 capsule (1,250 mcg total) by mouth once   a week.  ??? fluticasone propionate 50 mcg/actuation nasal spray; Commonly known as:   FLONASE; Use 1 spray into each nostril daily as needed for rhinitis.  ??? folic acid 1 MG tablet; Commonly known as: FOLVITE; Take 1 tablet (1 mg   total) by mouth daily.  ??? loperamide 2 mg capsule; Commonly known as: IMODIUM; Take 2 capsules (4   mg total) by mouth Three (3) times a day as needed for diarrhea.  ??? magnesium (amino acid chelate) 133 mg Tab; Generic drug: magnesium   oxide-Mg AA chelate; Take 1 tablet by mouth Two (2) times a day. HOLD   until directed to start by your coordinator.  ??? melatonin 3 mg Tab; Take 1 tablet (3 mg total) by mouth every evening.  ??? mycophenolate 180 MG EC tablet; Commonly known as: MYFORTIC; Take 1   tablet (180 mg total) by mouth Two (2) times a day.  ??? NON FORMULARY  ??? OLANZapine 5 MG tablet; Commonly known as: ZYPREXA; Take 1 tablet (5 mg   total) by mouth nightly.  ??? omeprazole 40 MG capsule; Commonly known as: PriLOSEC; Take 1 capsule   (40 mg total) by mouth daily.  ??? pentamidine in sterile water (PF); Inhale 6 mL (300 mg total) every   twenty-eight (28) days.  ??? predniSONE 5 MG tablet; Commonly known as: DELTASONE; Take 1 tablet (5   mg total) by mouth daily.  ??? ursodioL 300 mg capsule; Commonly known as: ACTIGALL; Take 1 capsule   (300 mg total) by mouth Three (3) times a day.  ??? valGANciclovir 450 mg tablet; Commonly known as: VALCYTE; Take 1 tablet   (450 mg total) by mouth daily.     STOP taking these medications    ??? oxyCODONE 5 MG immediate release tablet; Commonly known as: ROXICODONE       Pending Test Results:  10/5 blood cultures - NGTD at time of discharge    Discharge Instructions:  Activity:     Diet:    Other Instructions:  Other Instructions     Discharge instructions      Activity: Do not lift > 10-15 lbs for 1st 6 weeks, then gradually increase to 25 lbs over the following 6 weeks. Resume heavy lifting only after being cleared to do so at follow-up appointment in Transplant Surgery clinic.    Diet: Regular    Contact your transplant coordinator or the Transplant Surgery Office [phone]) during business hours or page the transplant coordinator on call [phone]) after business hours for:    - fever >100.5 degrees F by mouth, any fever with shaking chills, or other signs or symptoms of infection   -  uncontrolled nausea, vomiting, or diarrhea; inability to have a bowel movement for > 3 days.   - any problem that prevents taking medications as scheduled.   - pain uncontrolled with prescribed medication or new pain or tenderness at the surgical site   - sudden weight gain or increase in blood pressure (greater than 140/85)   - shortness of breath, chest pain / discomfort   - new or increasing jaundice   - urinary symptoms including pain / difficulty / burning or tea-colored urine   - any other new or concerning symptoms   - questions regarding your medications or continuing care         Activity: Do not lift > 10-15 lbs for 1st 6 weeks, then gradually increase to 25 lbs over the following 6 weeks. Resume heavy lifting only after being cleared to do so at follow-up appointment in Transplant Surgery clinic.    Diet: Regular    Contact your transplant coordinator or the Transplant Surgery Office [phone]) during business hours or page the transplant coordinator on call [phone]) after business hours for:    - fever >100.5 degrees F by mouth, any fever with shaking chills, or other signs or symptoms of infection   - uncontrolled nausea, vomiting, or diarrhea; inability to have a bowel movement for > 3 days.   - any problem that prevents taking medications as scheduled.   - pain uncontrolled with prescribed medication or new pain or tenderness at the surgical site   - sudden weight gain or increase in blood pressure (greater than 140/85)   - shortness of breath, chest pain / discomfort   - new or increasing jaundice   - urinary symptoms including pain / difficulty / burning or tea-colored urine   - any other new or concerning symptoms   - questions regarding your medications or continuing care        Labs and Other Follow-ups after Discharge:  Follow Up instructions and Outpatient Referrals     Discharge instructions      Activity: Do not lift > 10-15 lbs for 1st 6 weeks, then gradually increase to 25 lbs over the following 6 weeks. Resume heavy lifting only after being cleared to do so at follow-up appointment in Transplant Surgery clinic.    Diet: Regular    Contact your transplant coordinator or the Transplant Surgery Office [phone]) during business hours or page the transplant coordinator on call [phone]) after business hours for:    - fever >100.5 degrees F by mouth, any fever with shaking chills, or other signs or symptoms of infection   - uncontrolled nausea, vomiting, or diarrhea; inability to have a bowel movement for > 3 days.   - any problem that prevents taking medications as scheduled.   - pain uncontrolled with prescribed medication or new pain or tenderness at the surgical site   - sudden weight gain or increase in blood pressure (greater than 140/85)   - shortness of breath, chest pain / discomfort   - new or increasing jaundice   - urinary symptoms including pain / difficulty / burning or tea-colored urine   - any other new or concerning symptoms   - questions regarding your medications or continuing care          Future Appointments:  Appointments which have been scheduled for you    Dec 05, 2019  8:30 AM  (Arrive by 8:00 AM)  NURSE  30 with SURTRA NURSE  [facility] TRANSPLANT SURGERY [place] (  [facility] REGION) [address]  [place] [address]  [phone]      Dec 05, 2019  9:40 AM  (Arrive by 9:10 AM)  RETURN PHARMD with Jordan Likes, CPP  [facility] TRANSPLANT SURGERY [place] [facility] REGION) [address]  [place] [address]  [phone]      Dec 05, 2019 11:45 AM  (Arrive by 11:15 AM)  RETURN 15 with Loney Hering, MD  [facility] TRANSPLANT SURGERY [place] [facility] REGION) [address]  [place] [address]  [phone]      Dec 08, 2019  2:00 PM  (Arrive by 1:30 PM)  NURSE  30 with [facility]  [facility] TRANSPLANT SURGERY [place] [facility] REGION) [address]  [place] [address]  [phone]      Jan 13, 2020  9:00 AM  (Arrive by 8:30 AM)  RETURN VIDEO HCP DIRECT LINK with Bjorn Pippin  [facility] LIVER TRANSPLANT [place] [facility] REGION) [address]  [place] [address]  [phone]      Jan 13, 2020  1:00 PM  (Arrive by 12:30 PM)  RETURN VIDEO HCP DIRECT LINK with Lanelle Bal, RD/LDN  [facility] NUTRITION SERVICES TRANSPLANT [place] [facility] REGION) [address]  [place] [address]  [phone]      Jan 16, 2020  8:30 AM  (Arrive by 8:00 AM)  LAB ONLY with LAB PHLEB GRND UNCW  LAB PHLEB GRND FLR [employer] [facility] REGION) [address]  [place] [address]  [phone]      Jan 16, 2020  9:00 AM  (Arrive by 8:30 AM)  XR ABDOMEN 1 VIEW with Toney Reil RM 11  IMG DIAG X-RAY [facility]) [address]  [place] [address]  [phone]      Jan 16, 2020  9:40 AM  (Arrive by 9:10 AM)  RETURN PHARMD with Jordan Likes, CPP  [facility] TRANSPLANT SURGERY [place] [facility] REGION) [address]  [place] [address]  [phone]      Jan 16, 2020 11:00 AM  (Arrive by 10:30 AM)  RETURN  30 with Bertram Denver, FNP  [facility] TRANSPLANT SURGERY [place] [facility] REGION) [address]  [place] [address]  [phone]      Jan 24, 2020 10:30 AM  (Arrive by 10:20 AM)  RETURN VIDEO - EPIC AMWELL with Park Pope, MD  [facility] PSYCHIATRY Transplant AT [facility] REGION) [address]  [address]  [place] [address]  [phone]   Please sign into My [place] Chart at least 15 minutes before your appointment to complete any unfinished steps in the Get Started process. Get Started is required to be complete prior to your video visit.     Please visit [email or web address] for information about our safe promise as you receive care at [facility].    My [place] chart allows you to manage your health, send messages to your provider, view your test results, schedule and manage appointments, and request prescription refills securely and conveniently from your computer or mobile device.    You can go to [email or web address] to Sign in to your My  Chart account with your username and password. If you have forgotten them, please choose the Forgot Username? and/or Forgot Password? links to gain access. You can also  access your My [place] Chart account with the free MyChart mobile app for Android or iPhone.    If you need further assistance with accessing your My [facility] Chart account or for assistance in reaching your provider's office to reschedule or cancel your appointment  call  [facility] at [phone].         Apr 16, 2020  9:00 AM  (Arrive by 8:30 AM)  RETURN PHARMD with Jordan Likes, CPP  [facility] TRANSPLANT SURGERY [place] [facility] REGION) [address]  [place] [address]  [phone]      Apr 16, 2020 10:00 AM  (Arrive by 9:30 AM)  RETURN  30 with Bertram Denver, FNP  [facility] TRANSPLANT SURGERY [place] [facility] REGION) [address]  [place] [address]  [phone]      Apr 16, 2020 11:30 AM  (Arrive by 11:00 AM)  RETURN NUTRITION with Lanelle Bal, RD/LDN  [facility] NUTRITION SERVICES TRANSPLANT [place] [facility] REGION) [address]  [place] [address]  [phone]      Apr 16, 2020  1:00 PM  (Arrive by 12:30 PM)  RETURN  PSYCHOLOGY with Adalberto Cole, PhD  [facility] TRANSPLANT SURGERY [place] [facility] REGION) [address]  [place] [address]  [phone]      Jul 16, 2020  9:00 AM  (Arrive by 8:30 AM)  RETURN PHARMD with Jordan Likes, CPP  [facility] TRANSPLANT SURGERY [place] [facility] REGION) [address]  [place] [address]  [phone]      Jul 16, 2020 10:00 AM  (Arrive by 9:30 AM)  RETURN  30 with Bertram Denver, FNP  [facility] TRANSPLANT SURGERY [place] [facility] REGION) [address]  [place] [address]  [phone]

## 2019-12-02 NOTE — Unmapped (Signed)
VSS, afebrile. Pt c/o occipital headache throughout the day; prn tylenol and heating pad given. Tolerating regular diet. IV abx given. Up ad lib. Pt requests to have tacrolimus and mycophenolate at 8:30 am and 8:30 pm instead of scheduled times (6 am and 6 pm). Resting comfortably in room with family at bedside; will continue to monitor.    Problem: Adult Inpatient Plan of Care  Goal: Plan of Care Review  Outcome: Progressing  Goal: Patient-Specific Goal (Individualized)  Outcome: Progressing  Goal: Absence of Hospital-Acquired Illness or Injury  Outcome: Progressing  Intervention: Identify and Manage Fall Risk  Recent Flowsheet Documentation  Taken 12/01/2019 0757 by Catha Gosselin, RN  Safety Interventions:  ??? fall reduction program maintained  ??? lighting adjusted for tasks/safety  ??? low bed  Goal: Optimal Comfort and Wellbeing  Outcome: Progressing  Goal: Readiness for Transition of Care  Outcome: Progressing  Goal: Rounds/Family Conference  Outcome: Progressing     Problem: Impaired Wound Healing  Goal: Optimal Wound Healing  Outcome: Progressing     Problem: UTI (Urinary Tract Infection)  Goal: Improved Infection Symptoms  Outcome: Progressing

## 2019-12-02 NOTE — Unmapped (Signed)
Pharmacist Discharge Note for  Liver Transplant Recipient  Date of admission to [facility]: 11/29/2019  Reason for writing this note: patient requires medication-related outpatient intervention and/or monitoring    Reason for Admission: readmitted for evaluation of intermittent fevers, found to have UTI on admission  Diarrhea - gradually improving  s/p liver transplant on 10/18/19 due to [facility]     Discharge Date: 12/02/19    Past Medical History:   Diagnosis Date   ??? CAD (coronary artery disease)    ??? Hiatal hernia    ??? Osteopenia    ??? Primary biliary cholangitis (CMS-HCC)        Immunosuppression regimen:  Tacrolimus 4 mg BID; goal 8-10 (dose reduced on admission from 5 mg in AM and 4 mg in PM)  Myfortic 180 mg BID  Prednisone 5 mg daily    New antimicrobials during admission:   Ceftriaxone -> Keflex (E.coli UTI)  ID ppx: Valcyte, Pentamidine (last dose 9/15)    Medication changes to be instituted upon discharge:  Pertinent new medications: Keflex    Suggested monitoring for outpatient follow-up:   1. Continue to monitor diarrhea. Patient's diarrhea was reported to improve with reduction of Myfortic to 180 mg BID and the start of scheduled loperamide after admission. More loperamide will be given to patient on discharge. She may be a candidate for conversion to azathioprine from Myfortic due to GI intolerance. Due to delayed efficacy of azathioprine, would recommend increasing prednisone (and provide a gradual taper) if converting.   2. LFTs/alk phos. LFTs and alk phos increased from 10/6 to 10/7 and has remained stable from 10/7. Continue monitor liver function.    Vertis Kelch,  PharmD

## 2019-12-03 MED FILL — VALGANCICLOVIR 450 MG TAB: 450 | 30 days supply | Qty: 30 | Fill #0

## 2019-12-05 ENCOUNTER — Other Ambulatory Visit: Payer: Self-pay | Admitting: *Deleted

## 2019-12-05 ENCOUNTER — Ambulatory Visit: Admit: 2019-12-05 | Discharge: 2019-12-06 | Payer: PRIVATE HEALTH INSURANCE

## 2019-12-05 ENCOUNTER — Ambulatory Visit
Admit: 2019-12-05 | Discharge: 2019-12-06 | Payer: PRIVATE HEALTH INSURANCE | Attending: Student in an Organized Health Care Education/Training Program | Primary: Student in an Organized Health Care Education/Training Program

## 2019-12-05 DIAGNOSIS — K831 Obstruction of bile duct: Principal | ICD-10-CM

## 2019-12-05 DIAGNOSIS — Z944 Liver transplant status: Principal | ICD-10-CM

## 2019-12-05 DIAGNOSIS — Z79899 Other long term (current) drug therapy: Principal | ICD-10-CM

## 2019-12-05 DIAGNOSIS — R7989 Other specified abnormal findings of blood chemistry: Principal | ICD-10-CM

## 2019-12-05 DIAGNOSIS — E783 Hyperchylomicronemia: Secondary | ICD-10-CM | POA: Diagnosis not present

## 2019-12-05 DIAGNOSIS — I5030 Unspecified diastolic (congestive) heart failure: Secondary | ICD-10-CM | POA: Diagnosis not present

## 2019-12-05 DIAGNOSIS — Z7982 Long term (current) use of aspirin: Secondary | ICD-10-CM | POA: Diagnosis not present

## 2019-12-05 DIAGNOSIS — Z951 Presence of aortocoronary bypass graft: Secondary | ICD-10-CM | POA: Diagnosis not present

## 2019-12-05 DIAGNOSIS — D849 Immunodeficiency, unspecified: Secondary | ICD-10-CM | POA: Diagnosis not present

## 2019-12-05 DIAGNOSIS — I2510 Atherosclerotic heart disease of native coronary artery without angina pectoris: Secondary | ICD-10-CM | POA: Diagnosis not present

## 2019-12-05 LAB — CBC W/ AUTO DIFF
BASOPHILS ABSOLUTE COUNT: 0 10*9/L (ref 0.0–0.1)
BASOPHILS RELATIVE PERCENT: 0.6 %
EOSINOPHILS ABSOLUTE COUNT: 0.1 10*9/L (ref 0.0–0.4)
EOSINOPHILS RELATIVE PERCENT: 1.6 %
HEMATOCRIT: 26.4 % — ABNORMAL LOW (ref 36.0–46.0)
HEMOGLOBIN: 9 g/dL — ABNORMAL LOW (ref 12.0–16.0)
LARGE UNSTAINED CELLS: 1 % (ref 0–4)
LYMPHOCYTES ABSOLUTE COUNT: 0.5 10*9/L — ABNORMAL LOW (ref 1.5–5.0)
LYMPHOCYTES RELATIVE PERCENT: 12.2 %
MEAN CORPUSCULAR HEMOGLOBIN CONC: 34 g/dL (ref 31.0–37.0)
MEAN CORPUSCULAR HEMOGLOBIN: 34.8 pg — ABNORMAL HIGH (ref 26.0–34.0)
MEAN CORPUSCULAR VOLUME: 102.4 fL — ABNORMAL HIGH (ref 80.0–100.0)
MEAN PLATELET VOLUME: 8.2 fL (ref 7.0–10.0)
NEUTROPHILS ABSOLUTE COUNT: 2.9 10*9/L (ref 2.0–7.5)
NEUTROPHILS RELATIVE PERCENT: 78.7 %
PLATELET COUNT: 190 10*9/L (ref 150–440)
RED BLOOD CELL COUNT: 2.57 10*12/L — ABNORMAL LOW (ref 4.00–5.20)
RED CELL DISTRIBUTION WIDTH: 18 % — ABNORMAL HIGH (ref 12.0–15.0)
WBC ADJUSTED: 3.7 10*9/L — ABNORMAL LOW (ref 4.5–11.0)

## 2019-12-05 LAB — GAMMA GLUTAMYL TRANSFERASE: Gamma glutamyl transferase:CCnc:Pt:Ser/Plas:Qn:: 475 — ABNORMAL HIGH

## 2019-12-05 LAB — COMPREHENSIVE METABOLIC PANEL
ALBUMIN: 3.2 g/dL — ABNORMAL LOW (ref 3.4–5.0)
ALKALINE PHOSPHATASE: 788 U/L — ABNORMAL HIGH (ref 46–116)
ALT (SGPT): 174 U/L — ABNORMAL HIGH (ref 10–49)
ANION GAP: 8 mmol/L (ref 5–14)
AST (SGOT): 127 U/L — ABNORMAL HIGH (ref <=34)
BILIRUBIN TOTAL: 2.1 mg/dL — ABNORMAL HIGH (ref 0.3–1.2)
BLOOD UREA NITROGEN: 15 mg/dL (ref 9–23)
BUN / CREAT RATIO: 13
CALCIUM: 8.5 mg/dL — ABNORMAL LOW (ref 8.7–10.4)
CHLORIDE: 105 mmol/L (ref 98–107)
CO2: 20 mmol/L (ref 20.0–31.0)
EGFR CKD-EPI AA FEMALE: 63 mL/min/{1.73_m2} (ref >=60)
EGFR CKD-EPI NON-AA FEMALE: 54 mL/min/{1.73_m2} — ABNORMAL LOW (ref >=60)
GLUCOSE RANDOM: 121 mg/dL (ref 70–179)
POTASSIUM: 3.8 mmol/L (ref 3.4–4.5)
PROTEIN TOTAL: 6 g/dL (ref 5.7–8.2)
SODIUM: 133 mmol/L — ABNORMAL LOW (ref 135–145)

## 2019-12-05 LAB — PHOSPHORUS: Phosphate:MCnc:Pt:Ser/Plas:Qn:: 4.6

## 2019-12-05 LAB — ANION GAP: Anion gap 3:SCnc:Pt:Ser/Plas:Qn:: 8

## 2019-12-05 LAB — BILIRUBIN DIRECT: Bilirubin.glucuronidated+Bilirubin.albumin bound:MCnc:Pt:Ser/Plas:Qn:: 1.8 — ABNORMAL HIGH

## 2019-12-05 LAB — TACROLIMUS BLOOD: Lab: 9.4

## 2019-12-05 LAB — MEAN PLATELET VOLUME: Platelet mean volume:EntVol:Pt:Bld:Qn:Automated count: 8.2

## 2019-12-05 LAB — MAGNESIUM: Magnesium:MCnc:Pt:Ser/Plas:Qn:: 1.9

## 2019-12-05 MED ORDER — MYCOPHENOLATE SODIUM 180 MG TABLET,DELAYED RELEASE
ORAL_TABLET | Freq: Two times a day (BID) | ORAL | 11 refills | 30 days | Status: CP
Start: 2019-12-05 — End: 2020-12-04

## 2019-12-05 MED ADMIN — heparin, porcine (PF) 100 unit/mL injection 500 Units: 500 [IU] | INTRAVENOUS | @ 13:00:00 | Stop: 2019-12-06

## 2019-12-05 MED FILL — MYCOPHENOLATE SODIUM 180 MG: 180 | 30 days supply | Qty: 60 | Fill #0

## 2019-12-05 NOTE — Unmapped (Signed)
[facility] TRANS[facility]INIC PHARMACY NOTE  12/05/2019   Kathy Armstrong  [id number]    Medication changes today:   1. Increase Vitafusion calcium to 2 gummies BID    Education/Adherence tools provided today:  1.provided updated medication list  2. provided additional pill box education  3.  provided additional education on immunosuppression and transplant related medications including reviewing indications of medications, dosing and side effects    Follow up items:  1. goal of understanding indications and dosing of immunosuppression medications  2. Consider rosuvastatin or atorvastatin at future visit (CAD s/p CABG history); patient also followed closely by cardiologist  3. If persistent headache continues consider switching from Prograf to Envarsus  4. If heartburn/reflux continues consider increasing frequency of omeprazole to BID  5. Encourage patient to schedule Reclast visit in the next month or so  6. LFTs    Next visit with pharmacy in 1-2 weeks  ____________________________________________________________________    Kathy Armstrong is a 50 y.o. 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     Other PMH significant for HFpEF, CAD s/p CABG x2 (2019), HLD (familial)    Post op complicated by: catatonia w/psychiatry consult.    Interval history: Admitted 10/5-10/8 w/E Coli UTI.  She was treated w/CTX -> cephalexin.    Seen by pharmacy today for: medication management and pill box fill and adherence education; last seen by pharmacy 3 weeks ago     CC:  Patient complains of  persistent headache, n/v (likely related to current antibiotic therapy), and poor appetite    Vitals:    12/05/19 0833   BP: 122/59   Pulse: 66   Temp: 36.4 ??C (97.5 ??F)       Allergies   Allergen Reactions   ??? Codeine Nausea And Vomiting   ??? Erythromycin Nausea And Vomiting   ??? Fentanyl Nausea And Vomiting       Medications reviewed in EPIC medication station and updated today by the clinical pharmacist practitioner.    Outpatient Encounter Medications as of 12/05/2019   Medication Sig Dispense Refill   ??? acetaminophen (TYLENOL) 325 MG tablet Take 2 tablets (650 mg total) by mouth every six (6) hours as needed for pain or fever (> 100.4 F). 100 tablet 0   ??? aspirin (ASPIR-81) 81 MG tablet Take 1 tablet (81 mg total) by mouth daily. 100 tablet 10   ??? atovaquone (MEPRON) 750 mg/5 mL suspension Holding script for now (1500mg  daily) as patient wanting to try pentamidine each month 300 mL 5   ??? carvediloL (COREG) 25 MG tablet Take 1 tablet (25 mg total) by mouth Two (2) times a day. 60 tablet 11   ??? cephalexin (KEFLEX) 500 MG capsule Take 1 capsule (500 mg total) by mouth Every six (6) hours for 6 days. 24 capsule 0   ??? ergocalciferol-1,250 mcg, 50,000 unit, (DRISDOL) 1,250 mcg (50,000 unit) capsule Take 1 capsule (1,250 mcg total) by mouth once a week. 8 capsule 0   ??? fluticasone propionate (FLONASE) 50 mcg/actuation nasal spray Use 1 spray into each nostril daily as needed for rhinitis. 16 g 5   ??? folic acid (FOLVITE) 1 MG tablet Take 1 tablet (1 mg total) by mouth daily. 30 tablet 11   ??? loperamide (IMODIUM) 2 mg capsule Take 2 capsules (4 mg total) by mouth Three (3) times a day as needed for diarrhea. 100 capsule 5   ??? magnesium oxide-Mg AA chelate (MAGNESIUM, AMINO ACID CHELATE,) 133 mg Tab  Take 1 tablet by mouth Two (2) times a day. HOLD until directed to start by your coordinator. 100 tablet 11   ??? melatonin 3 mg Tab Take 1 tablet (3 mg total) by mouth every evening. 100 tablet 3   ??? multivit with min #[id number] (DEKAS PLUS, FOLIC ACID,) 200 mcg-1,000 [id number] mg cap Take 1 tablet by mouth. Take 1 tablet by mouth daily     ??? NON FORMULARY Take 500 mg by mouth daily with evening meal. Vitafusion Calcium: Take two gummies by mouth daily     ??? OLANZapine (ZYPREXA) 5 MG tablet Take 1 tablet (5 mg total) by mouth nightly. 30 tablet 11   ??? omeprazole (PRILOSEC) 40 MG capsule Take 1 capsule (40 mg total) by mouth daily. 30 capsule 5   ??? pentamidine in sterile water (PF) Inhale 6 mL (300 mg total) every twenty-eight (28) days. 6 mL 5   ??? predniSONE (DELTASONE) 5 MG tablet Take 1 tablet (5 mg total) by mouth daily. 30 tablet 11   ??? tacrolimus (PROGRAF) 1 MG capsule Take 4 capsules (4 mg total) by mouth two (2) times a day. 270 capsule 11   ??? ursodioL (ACTIGALL) 300 mg capsule Take 1 capsule (300 mg total) by mouth Three (3) times a day. 90 capsule 11   ??? valGANciclovir (VALCYTE) 450 mg tablet Take 1 tablet (450 mg total) by mouth daily. 30 tablet 2   ??? [DISCONTINUED] mycophenolate (MYFORTIC) 180 MG EC tablet Take 1 tablet (180 mg total) by mouth Two (2) times a day. 60 tablet 11     No facility-administered encounter medications on file as of 12/05/2019.     Induction agent : basiliximab    CURRENT IMMUNOSUPPRESSION: tacrolimus 4 mg PO bid  prograf/Envarsus/cyclosporine goal: 8-10   myfortic180  mg PO bid    prednisone 5 mg daily (per PBC protocol)    Patient complains of headache--constant L temporal HA; tremor seen at last visit seems to have resolved.    IMMUNOSUPPRESSION DRUG LEVELS:  Lab Results   Component Value Date    Tacrolimus, Trough 10.9 12/02/2019    Tacrolimus, Trough 11.4 12/01/2019    Tacrolimus, Trough 11.5 11/30/2019    Tacrolimus, Timed 9.4 12/05/2019     No results found for: CYCLO  No results found for: EVEROLIMUS  No results found for: SIROLIMUS    Prograf level is accurate 12 hour trough    Graft function: worsening  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  Liver U/S today appeared WNL; will schedule for EUS then possible ERCP vs biopsy. If HA remains constant consider transition to Envarsus. Continue to monitor.    OI Prophylaxis:   CMV Status: D+/ R+, moderate risk . CMV prophylaxis: valganciclovir 450 mg daily x 3 months per protocol.  No results found for: CMVCP  PCP Prophylaxis: pentamidine 300 mg inhalation monthly (last dose 9/15) x 6 months (switched from Bactrim 2/2 hyperkalemia)  Thrush:  completed in hospital  Patient is  tolerating infectious prophylaxis well    Plan: Given recent UTI and medication side effects, the patient does not want to adjust her regimen at this time; continue w/pentamidine. Patient does not want liquid medication and is not interested in switching to atovaquone; consider switching bact to Bactrim if oral therapy is desired. Continue to monitor.    CAD s/p CABG:  asa 81 mg   The 10-year ASCVD risk score Denman George DC Jr., et al., 2013) is: 4.2%  Statin therapy: Indicated; currently on no statin, was on alirocumab pre transplant (lipid specialist had avoided statin 2/2 ESLD)  Plan: At next visit consider starting rosuvastatin or atorvastatin given CAD history. Continue to monitor.    BP: Goal < 140/90. Clinic vitals reported above  Home BP ranges: 100-110s/60s  Current meds include: carvedilol 25 mg BID  Plan: within goal, continue to monitor.    Anemia:  H/H:   Lab Results   Component Value Date    HGB 9.0 (L) 12/05/2019     Lab Results   Component Value Date    HCT 26.4 (L) 12/05/2019     Iron panel:  Lab Results   Component Value Date    IRON 138 11/21/2019    TIBC 227 (L) 11/21/2019    FERRITIN 379.9 (H) 11/21/2019     Lab Results   Component Value Date    Iron Saturation (%) 61 11/21/2019     Prior ESA use: none post transplant    Plan: below goal but stable. Continue to monitor.     DM:   Lab Results   Component Value Date    A1C <3.8 (L) 10/17/2019   . Goal A1c < 7  History of Dm? No  Plan:  Continue to monitor    Fluid intake: 40 oz free water daily + other drinks (non-caffeinated ginger ale, root beer, juices)  Plan: Increase fluid intake as able (difficulty given nausea and reduced appetite)    Electrolytes: wnl  Meds currently on: Mg plus protein 133 mg BID  Plan: Continue to monitor     GI/BM: Pt reports nausea (vomited once since 10/8 discharge, may be due to antibiotics on empty stomach), poor appetite  Meds currently on: loperamide 2 mg TID PRN (no longer using), Dekas Plus 1 tablet daily, docusate 100 mg PRN (not using), Miralax PRN (not using), omeprazole 40 mg daily, ondansetron 4 mg BID PRN (infrequent use as doesn't find helpful)  Plan: Nausea seems to be linked to current UTI antibiotic therapy, which ends today. If nausea persists, reassess other contributing factors. The patient reports poor appetite; she is trying to eat high protein foods but has noticed the more water/fluids she drinks the less she wants to eat. Psych intends to switch the patient from olanzapine to mirtazapine; messaged psychiatry team to ask if can switch sooner rather than later for appetite stimulating benefits. Continue to monitor.     PBC  Meds currently on: urosdiol 300 mg TID  Plan: Continue to monitor    Psych/insomnia - Sleeping very well  Meds currently on: olanzapine 5 mg HS, melatonin 3 mg HS   Plan: Defer to psychiatry.    Pain: pt reports significant HA pain  Meds currently on: APAP 500 mg PRN (taking 1000 mg BID-TID), oxycodone 5-10 mg q6h PRN (not using)  Plan: Consider switching to Envarsus to help reduce side effects burden. Continue to monitor    Bone health:   Vitamin D Level: last level is 15.4 (11/21/19). Goal > 30.   Last DEXA results:  04/07/19 osteoporosis of lumbar spine and hip)  Current meds include: Reclast 5 mg q12 months, Vitafusion calcium 1g daily, ergocalciferol 50,000 units weekly x8 weeks (first dose 10/4)  Plan: Patient's vitamin D level is out of goal, therefore will continue on ergocalciferol. Note: When it is time for maintenance therapy the patient is already  getting cholecalciferol 3000 units daily in her Dekas Plus multivitamin. Current Scr okay to schedule annual Reclast appointment. Continue to monitor.     Women's/Men's Health:  Kathy Armstrong is a 49 y.o. Female perimenopausal. Patient reports no men's/women's health issues  Plan: Continue to monitor    Pharmacy Preference:  [facility] for tacrolimus, Myfortic, and Valcyte; all other meds from [facility]    Medication Access Issues:  none    Adherence: Patient has average understanding of medications; was able to independently identify names/doses of immunosuppressants and OI meds.  Patient  does fill their own pill box on a regular basis at home.  Patient brought medication card:yes  Pill box: was correct  Plan: Provided extensive adherence counseling/intervention    I spent a total of 15 minutes face to face with the patient delivering clinical care and providing education/counseling. Patient was reviewed with Dr. Doyne Keel who was agreement with the stated plan:     During this visit, the following was completed:   BG log data assessment  BP log data assessment  Labs ordered and evaluated  complex treatment plan >1 DS     All questions/concerns were addressed to the patient's satisfaction.  __________________________________________  Maryruth Eve, PharmD Candidate 2022    Rand Boller MINCEMOYER, PHARMD, CPP  [employer] CLINICAL PHARMACIST PRACTITIONER  PAGER [phone]

## 2019-12-05 NOTE — Unmapped (Signed)
Please see pharmacy visit for additional charge information.    Patient's port-a-cath accessed in clinic today for lab draw with brisk blood return noted. Labs collected and sent, port flushed with 20 mLs normal saline and heparin locked per protocol. Please see flowsheets for details. Patient tolerated well with no complications/complaints.

## 2019-12-05 NOTE — Patient Outreach (Signed)
[facility]) Care Management  12/05/2019  Christine Butler 09-Apr-1970 [id number]   Transition of care call/case closure   Referral received:10/21/19 Initial outreach:11/11/19 Insurance: [employer]    Subjective: Outreach call to patient to follow up on readmission 105/-10/8, patient states that she is currently in clinic appointment. She discussed concern regarding liver enzymes being elevated.     Objective:  [place] [place] [facility] Liver TransplantComorbidities include: Endstage liver disease , cirrhosis, primary cholangitis, gastric varices s/p banding, heart failure, cabg, agitated Catatonia.  Shewas discharged to home on 9/17/2 outpatient lab work at [facility], and outpatient physical therapy to be arranged, patient was provided rollator at discharge.  Patient readmitted to [facility] 10/5-10/8/48for Urinary tract infection.     Plan:  Will monitor patient disposition and plan call for transition of care assess for care coordination needs.    [name], RN, BSN  [facility] Management Coordinator  [phone]- Mobile [phone]- Toll Free Main Office

## 2019-12-06 ENCOUNTER — Encounter
Admit: 2019-12-06 | Discharge: 2019-12-10 | Disposition: A | Discharge status: Home / Self Care | Payer: PRIVATE HEALTH INSURANCE | Attending: Certified Registered" | Admitting: Student in an Organized Health Care Education/Training Program

## 2019-12-06 ENCOUNTER — Ambulatory Visit
Admit: 2019-12-06 | Discharge: 2019-12-10 | Disposition: A | Discharge status: Home / Self Care | Payer: PRIVATE HEALTH INSURANCE | Admitting: Student in an Organized Health Care Education/Training Program

## 2019-12-06 ENCOUNTER — Encounter
Admit: 2019-12-06 | Discharge: 2019-12-10 | Disposition: A | Discharge status: Home / Self Care | Payer: PRIVATE HEALTH INSURANCE | Attending: Anesthesiology | Admitting: Student in an Organized Health Care Education/Training Program

## 2019-12-06 DIAGNOSIS — Z944 Liver transplant status: Secondary | ICD-10-CM | POA: Diagnosis not present

## 2019-12-06 DIAGNOSIS — R918 Other nonspecific abnormal finding of lung field: Secondary | ICD-10-CM | POA: Diagnosis not present

## 2019-12-06 DIAGNOSIS — R0600 Dyspnea, unspecified: Secondary | ICD-10-CM | POA: Diagnosis not present

## 2019-12-06 DIAGNOSIS — K838 Other specified diseases of biliary tract: Secondary | ICD-10-CM | POA: Diagnosis not present

## 2019-12-06 DIAGNOSIS — E441 Mild protein-calorie malnutrition: Secondary | ICD-10-CM | POA: Diagnosis not present

## 2019-12-06 DIAGNOSIS — I499 Cardiac arrhythmia, unspecified: Secondary | ICD-10-CM | POA: Diagnosis not present

## 2019-12-06 DIAGNOSIS — Z79899 Other long term (current) drug therapy: Secondary | ICD-10-CM | POA: Diagnosis not present

## 2019-12-06 DIAGNOSIS — K9189 Other postprocedural complications and disorders of digestive system: Secondary | ICD-10-CM | POA: Diagnosis not present

## 2019-12-06 DIAGNOSIS — R1011 Right upper quadrant pain: Secondary | ICD-10-CM | POA: Diagnosis not present

## 2019-12-06 DIAGNOSIS — Z951 Presence of aortocoronary bypass graft: Secondary | ICD-10-CM | POA: Diagnosis not present

## 2019-12-06 DIAGNOSIS — R1010 Upper abdominal pain, unspecified: Secondary | ICD-10-CM | POA: Diagnosis not present

## 2019-12-06 DIAGNOSIS — Z5181 Encounter for therapeutic drug level monitoring: Secondary | ICD-10-CM | POA: Diagnosis not present

## 2019-12-06 DIAGNOSIS — R748 Abnormal levels of other serum enzymes: Secondary | ICD-10-CM | POA: Diagnosis not present

## 2019-12-06 DIAGNOSIS — K831 Obstruction of bile duct: Secondary | ICD-10-CM | POA: Diagnosis not present

## 2019-12-06 DIAGNOSIS — R1013 Epigastric pain: Secondary | ICD-10-CM | POA: Diagnosis not present

## 2019-12-06 DIAGNOSIS — R7401 Elevation of levels of liver transaminase levels: Secondary | ICD-10-CM | POA: Diagnosis not present

## 2019-12-06 DIAGNOSIS — K766 Portal hypertension: Secondary | ICD-10-CM | POA: Diagnosis not present

## 2019-12-06 DIAGNOSIS — K859 Acute pancreatitis without necrosis or infection, unspecified: Secondary | ICD-10-CM | POA: Diagnosis not present

## 2019-12-06 DIAGNOSIS — I5032 Chronic diastolic (congestive) heart failure: Secondary | ICD-10-CM | POA: Diagnosis not present

## 2019-12-06 DIAGNOSIS — J90 Pleural effusion, not elsewhere classified: Secondary | ICD-10-CM | POA: Diagnosis not present

## 2019-12-06 DIAGNOSIS — Z09 Encounter for follow-up examination after completed treatment for conditions other than malignant neoplasm: Secondary | ICD-10-CM | POA: Diagnosis not present

## 2019-12-06 DIAGNOSIS — R161 Splenomegaly, not elsewhere classified: Secondary | ICD-10-CM | POA: Diagnosis not present

## 2019-12-06 DIAGNOSIS — R188 Other ascites: Secondary | ICD-10-CM | POA: Diagnosis not present

## 2019-12-06 DIAGNOSIS — I2510 Atherosclerotic heart disease of native coronary artery without angina pectoris: Secondary | ICD-10-CM | POA: Diagnosis not present

## 2019-12-06 DIAGNOSIS — Z20822 Contact with and (suspected) exposure to covid-19: Secondary | ICD-10-CM | POA: Diagnosis not present

## 2019-12-06 LAB — CBC W/ AUTO DIFF
BASOPHILS ABSOLUTE COUNT: 0 10*9/L (ref 0.0–0.1)
BASOPHILS RELATIVE PERCENT: 0.3 %
EOSINOPHILS ABSOLUTE COUNT: 0 10*9/L (ref 0.0–0.4)
EOSINOPHILS RELATIVE PERCENT: 0.4 %
HEMATOCRIT: 27.7 % — ABNORMAL LOW (ref 36.0–46.0)
HEMOGLOBIN: 9.2 g/dL — ABNORMAL LOW (ref 12.0–16.0)
LARGE UNSTAINED CELLS: 1 % (ref 0–4)
LYMPHOCYTES RELATIVE PERCENT: 5.4 %
MEAN CORPUSCULAR HEMOGLOBIN CONC: 33.1 g/dL (ref 31.0–37.0)
MEAN CORPUSCULAR HEMOGLOBIN: 34.1 pg — ABNORMAL HIGH (ref 26.0–34.0)
MEAN CORPUSCULAR VOLUME: 103.3 fL — ABNORMAL HIGH (ref 80.0–100.0)
MEAN PLATELET VOLUME: 7.4 fL (ref 7.0–10.0)
MONOCYTES ABSOLUTE COUNT: 0.3 10*9/L (ref 0.2–0.8)
MONOCYTES RELATIVE PERCENT: 4.9 %
NEUTROPHILS ABSOLUTE COUNT: 5.1 10*9/L (ref 2.0–7.5)
NEUTROPHILS RELATIVE PERCENT: 88.4 %
PLATELET COUNT: 215 10*9/L (ref 150–440)
RED BLOOD CELL COUNT: 2.68 10*12/L — ABNORMAL LOW (ref 4.00–5.20)
WBC ADJUSTED: 5.8 10*9/L (ref 4.5–11.0)

## 2019-12-06 LAB — MAGNESIUM: Magnesium:MCnc:Pt:Ser/Plas:Qn:: 2

## 2019-12-06 LAB — COMPREHENSIVE METABOLIC PANEL
ALBUMIN: 3.3 g/dL — ABNORMAL LOW (ref 3.4–5.0)
ALKALINE PHOSPHATASE: 696 U/L — ABNORMAL HIGH (ref 46–116)
ALT (SGPT): 160 U/L — ABNORMAL HIGH (ref 10–49)
ANION GAP: 7 mmol/L (ref 5–14)
AST (SGOT): 88 U/L — ABNORMAL HIGH (ref <=34)
BILIRUBIN TOTAL: 1.3 mg/dL — ABNORMAL HIGH (ref 0.3–1.2)
BUN / CREAT RATIO: 11
CALCIUM: 8.9 mg/dL (ref 8.7–10.4)
CHLORIDE: 106 mmol/L (ref 98–107)
CO2: 20 mmol/L (ref 20.0–31.0)
CREATININE: 1.15 mg/dL — ABNORMAL HIGH
EGFR CKD-EPI AA FEMALE: 65 mL/min/{1.73_m2} (ref >=60)
EGFR CKD-EPI NON-AA FEMALE: 56 mL/min/{1.73_m2} — ABNORMAL LOW (ref >=60)
GLUCOSE RANDOM: 126 mg/dL (ref 70–179)
PROTEIN TOTAL: 6 g/dL (ref 5.7–8.2)
SODIUM: 133 mmol/L — ABNORMAL LOW (ref 135–145)

## 2019-12-06 LAB — ANISOCYTOSIS

## 2019-12-06 LAB — GAMMA GLUTAMYL TRANSFERASE: Gamma glutamyl transferase:CCnc:Pt:Ser/Plas:Qn:: 412 — ABNORMAL HIGH

## 2019-12-06 LAB — SLIDE REVIEW

## 2019-12-06 LAB — PROTIME: Coagulation tissue factor induced:Time:Pt:PPP:Qn:Coag: 11.8

## 2019-12-06 LAB — SMEAR REVIEW

## 2019-12-06 LAB — LACTATE BLOOD VENOUS: Lactate:SCnc:Pt:BldV:Qn:: 0.8

## 2019-12-06 LAB — LIPASE: Triacylglycerol lipase:CCnc:Pt:Ser/Plas:Qn:: 859 — ABNORMAL HIGH

## 2019-12-06 LAB — ALBUMIN: Albumin:MCnc:Pt:Ser/Plas:Qn:: 3.3 — ABNORMAL LOW

## 2019-12-06 LAB — BILIRUBIN DIRECT: Bilirubin.glucuronidated+Bilirubin.albumin bound:MCnc:Pt:Ser/Plas:Qn:: 1 — ABNORMAL HIGH

## 2019-12-06 LAB — PHOSPHORUS: Phosphate:MCnc:Pt:Ser/Plas:Qn:: 4.3

## 2019-12-06 MED ADMIN — MORPhine injection 1 mg: 1 mg | INTRAVENOUS | @ 23:00:00 | Stop: 2019-12-06

## 2019-12-06 MED ADMIN — promethazine (PHENERGAN) 6.25 mg in sodium chloride (NS) 0.9 % 25 mL infusion: 6.25 mg | INTRAVENOUS | @ 15:00:00 | Stop: 2019-12-06

## 2019-12-06 MED ADMIN — MORPhine injection 1 mg: 1 mg | INTRAVENOUS | @ 15:00:00 | Stop: 2019-12-06

## 2019-12-06 MED ADMIN — lactated ringers bolus 1,000 mL: 1000 mL | INTRAVENOUS | @ 18:00:00 | Stop: 2019-12-06

## 2019-12-06 MED ADMIN — gadobenate dimeglumine (MULTIHANCE) 529 mg/mL (0.1mmol/0.2mL) solution 5.8 mL: 5.8 mL | INTRAVENOUS | @ 21:00:00 | Stop: 2019-12-06

## 2019-12-06 MED ADMIN — promethazine (PHENERGAN) 6.25 mg in sodium chloride (NS) 0.9 % 25 mL infusion: 6.25 mg | INTRAVENOUS | @ 18:00:00 | Stop: 2019-12-06

## 2019-12-06 MED ADMIN — MORPhine injection 1 mg: 1 mg | INTRAVENOUS | @ 18:00:00 | Stop: 2019-12-06

## 2019-12-06 MED ADMIN — ondansetron (ZOFRAN) injection 4 mg: 4 mg | INTRAVENOUS | @ 23:00:00

## 2019-12-06 NOTE — Unmapped (Signed)
Received on call page from patient's sister who reports she paged on call coordinator due to confusion over clarity of plan of care MRI vs. ERCP.  After returning call to patient's sister she reports a provider has come into the room and they now have clarity of plan of care.  Reminded sister that inpatient team will have most up to date information regarding plan of care given outpatient coordinator is not directly involved with inpatient team she verbalized understanding provided blameless apology for patient experiencing confusion surrounding plan of care and conflicting information.  She confirms they now have clarity.  Encouraged her they can always speak up to ask bedisde RN to page provider or hepatology team as needed during hospitalization.

## 2019-12-06 NOTE — Unmapped (Signed)
Pt is post liver transplant and is having severe abd pain. Pt saw her md yesterday and was told she needed an mrcp possibly

## 2019-12-06 NOTE — Unmapped (Signed)
Gastroenterology Advanced Biliary Service  Initial Consultation      Consulting provider: Trenda Moots, MD  Consulting service: Emergency Medicine  Date of admission: 12/06/2019    Kathy Armstrong is a 49 y.o. female seen in consultation at the request of Trenda Moots, MD for abnormal liver enzymes post OLT.            Assessment and Plan:   Kathy Armstrong is a 49 y.o. 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 Lipase elevated 859, concerning for acute pancreatitis.    MRCP with narrowing of the level of the anastomosis in the common bile duct with associated moderate upstream intra and extrahepatic biliary ductal dilatation.    Recommendations:  Plan for ERCP today for stent placement    Thank you for this consult. We will continue to follow along.   Discussed with Dr. Council Mechanic  GI Biliary consult pager: [phone]    [name], MD  Gastroenterology and Hepatology Fellow, PGY-4    GI staff  I saw and evaluated the patient, participating in the key portions of the service.  I reviewed the resident???s note.  I agree with the resident???s findings and plan. [name], MD    Subjective:   Subjective:   HPI: 49 y.o. female with PMHx of  PMH of OLT on 10/18/19 history of CAD, heart failure (last echo 8/21 with LVEF 60-65%), liver failure 2/2 PBC s/p OLT on 10/18/19.    Woke up this morning at 2 AM with severe epigastric pain associated with nausea and vomiting. Last ate cereal at 2AM which she vomiting and has not had anything else to eat or drink since then. Currently having 8-9/10 epigastric abdominal pain.     Bilirubin acutely elevated to 2.1 from 0.5, acute elevation in AST/ALT to the 100s from 50s. ALP 788, which has been uptrending progressively. Also found to have a lipase elevation to 859 on admission.    Review of Systems: ROS otherwise negative other than those discussed in HPI           Objective:   Objective:   Vital signs in last 24 hours:  Temp:  [36.5 ??C] 36.5 ??C  Heart Rate:  [71-79] 72  Resp:  [20-24] 20  BP: (120-141)/(62-73) 120/62  SpO2:  [98 %-99 %] 98 %    Physical exam  Constitutional: Appears very uncomfortable, in significant amount of pain.  CV: No noted peripheral edema  Pulm: Normal work of breathing  Abdomen: Epigastric tenderness to palpation.   Skin: No rashes, jaundice or skin lesions noted.  Neuro: No focal deficits.     Intake/Output last 3 shifts:  No intake/output data recorded.  Intake/Output this shift:  No intake/output data recorded.    Scheduled meds:      Continuous meds:      PRN meds:        Labs/Studies:  Labs, studies, and imaging from the last 24hrs per EMR and personally reviewed.    Recent Labs     12/05/19  0828 12/06/19  1003 12/06/19  1004   WBC 3.7*  --  5.8   HGB 9.0*  --  9.2*   PLT 190  --  215   NA 133*  --  133*   K 3.8  --  3.8   CL 105  --  106   BUN 15  --  13   CREATININE 1.18*  --  1.15*   GLU 121  --  126   INR  --  1.01  --    ALBUMIN 3.2*  --  3.3*   AST 127*  --  88*   ALT 174*  --  160*   ALKPHOS 788*  --  696*   BILITOT 2.1*  --  1.3*     No results for input(s): IRON, TIBC, FERRITIN in the last 72 hours.      Imaging:  MRCP 12/07/19  -- Diffuse restricted diffusion throughout the pancreas with mesenteric stranding in the epigastrium, which could be seen with acute pancreatitis. There is also slightly increased T1 signal throughout the pancreatic body which could be seen with hemorrhagic pancreatitis versus normal pancreatic enzymes. Recommend clinical correlation.     -- Cirrhotic liver morphology with stigmata of portal hypertension including diffuse abdominopelvic ascites and splenomegaly.     ====================  ADDENDUM (12/06/2019 8:39 PM):   On review, the following additional findings were noted:     Status post liver transplant without evidence of cirrhosis. Narrowing of the level of the anastomosis in the common bile duct with associated moderate upstream intra and extrahepatic biliary ductal dilatation.     Pancreas demonstrates diffusely decreased T1 signal, probably related to prior/current inflammation with surrounding edema. Findings are suspicious for acute interstitial pancreatitis. Correlate with serum amylase and lipase.    US hepatic artery vasculature 10/12  Patent hepatic transplant vasculature.  Stable resistive indices in the hepatic transplant arteries, within normal limits.  Small volume free fluid within the pelvis.

## 2019-12-06 NOTE — Unmapped (Signed)
Patient seen in clinic with Dr. Doyne Keel and txp pharmacy. Denies N/V/D/Fever. Yesterday had soft BM and no water. Not using imodium. Reports tonight is the last night of the abx she was given at discharge. Been sneezing and blowing her nose.   Took her tac at 8:30pm.   Escripted myfortic to [employer] and patient aware.   To have pentamidine on Thursday.   Reviewed labs with patient letting her know that her LFTs are elevated. Mentioned this could be biliary related. Discussed that at times scans are done first to determine if there is biliary issue occurring or something else. Patient asked if she was rejecting if she would need a new liver. Mentioned if she has rejection that depending on the severity of the rejection that she would be given oral steroids, IV steroids or another type of infusion.   Paged Dr. Doyne Keel with liver labs and also discussed with Dr.Shah. Dr. Doyne Keel wanted liver US done today.   Called TPA who submitted for PA and talked with PA specialist. Communicated with patient to go down for liver US as a slot was open and to return to clinic after per Dr. Doyne Keel.   Dr. Doyne Keel aware.   At 2:45pm called reading room for liver US and provider had reviewed and waited for attending. Sent message to Dr. Doyne Keel around 3:30pm that liver US report not up and can call the reading room #.   Spoke with Dr. Doyne Keel and said liver US was fine and patient to be scheduled for MRI/MRCP ideally tomorrow or Wednesday, labs tomorrow and he will talk with Dr. Sherryll Burger about scheduling ERCP for Thursday; if MRI/MRCP is okay, then ERCP can be canceled.   Talked with patient letting her know the above and she denied allergy to contrast and no metal objects in her body. Called PA specialist and sent email with information and included TPA on email.   Came back to talk with patient who verbalized understanding to:  1) Get labs at [facility] tomorrow so we get results tomorrow versus [facility] that is a day later; gave her a copy of the lab order to take to [facility].  2) Do not eat anything tomorrow morning and fine to wait until 10am to take medications if needed until we find out the time of the MRI; TPA will call her with this.  3) ERCP is being scheduled for Thursday to have in place with hopes MRI/MRCP will be done tomorrow or Wednesday and if needed the ERCP will be canceled if MRI/MRCP is okay.     Patient talked with GIP about ERCP and scheduled for 9am for ERCP.   Educated patient 6-10 minutes about elevated LFTs and possible causes and treatments; also plan with MRI/MRCP, labs and ERCP.

## 2019-12-06 NOTE — Unmapped (Signed)
Pt's sister left me VM ~2:30pm today requesting a call back. Returned her call and she had already spoken with on call coordinator and her questions were answered. Sister reported pt in MRI now. Offered support and will follow tomorrow if inpatient

## 2019-12-06 NOTE — Unmapped (Signed)
[facility]  Emergency Department Provider Note      ED Clinical Impression     Final diagnoses:   None       Initial Impression, ED Course, Assessment and Plan     Impression:   Kathy Armstrong is a 49 y.o. 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 coli UTI Keflex on DC, presenting for significant epigastric abdominal pain.Of note patient is currently on Tac/CellCept/Pred 5 for immunosuppression. Presentation to ED, patient's VS stable. Significant bilateral upper quadrant abdominal pain noted. T bili elevation noted yesterday to 2.1, D bili 1.8, AST 127, ALT 174, ALP 788, GGT 630, CR 1.18. RUQ U/S not read with obstruction CBD.Presentation likely due to choledocholithiasis.          Additional Medical Decision Making     I have reviewed the vital signs and the nursing notes. Labs and radiology results that were available during my care of the patient were independently reviewed by me and considered in my medical decision making.     I staffed the case with the ED attending, Dr. Lum Babe.    :  I independently visualized the EKG tracing.   I independently visualized the radiology images.   I reviewed the patient's prior medical records .     Portions of this record have been created using Scientist, clinical (histocompatibility and immunogenetics). Dictation errors have been sought, but may not have been identified and corrected.  ____________________________________________       History     Chief Complaint  Abdominal Pain      HPI   Kathy Armstrong is a 49 y.o. 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 coli UTI Keflex on DC, presenting for significant epigastric abdominal pain.    Patient states that she went to her transplant clinic yesterday and was noted to have elevated counts. Ultrasound was performed and noted CBD obstruction. Recommended MRI outpatient for possible MRCP/ERCP. Patient states that roughly at 2 AM she no significant epigastric pain alongside emesis. Her emesis has been green in nature, limiting her p.o. intake. Denies fever/chills, states her diarrhea from prior admit has mostly resolved, CP/S OB. Of note patient is currently on Tac/CellCept/Pred 5 for immunosuppression.     Presentation to ED, patient's VS stable. Significant bilateral upper quadrant abdominal pain noted. T bili elevation noted yesterday to 2.1, D bili 1.8, AST 127, ALT 174, ALP 788, GGT 630, CR 1.18. RUQ U/S not read with obstruction CBD.    Presentation likely due to choledocholithiasis.        Past Medical History:   Diagnosis Date   ??? CAD (coronary artery disease)    ??? Hiatal hernia    ??? Osteopenia    ??? Primary biliary cholangitis (CMS-HCC)        Patient Active Problem List   Diagnosis   ??? Primary biliary cholangitis (CMS-HCC)   ??? S/P CABG x 2   ??? Cirrhosis (CMS-HCC)   ??? Hyponatremia with normal extracellular fluid volume   ??? Dyslipidemia, goal LDL below 70   ??? Hyponatremia with excess extracellular fluid volume   ??? (HFpEF) heart failure with preserved ejection fraction (CMS-HCC)   ??? Altered mental status   ??? Catatonia   ??? Other insomnia   ??? Ascites   ??? Pancytopenia (CMS-HCC)   ???  Encephalopathy   ??? Liver replaced by transplant (CMS-HCC)   ??? Acute blood loss anemia   ??? Atypical chest pain   ??? Atypical squamous cells of undetermined significance (ASCUS) on Papanicolaou smear of cervix   ??? CAD (coronary artery disease), native coronary artery   ??? GERD (gastroesophageal reflux disease)   ??? High grade squamous intraepithelial lesion (HGSIL) on cytologic smear of cervix   ??? Hypo-osmolality and hyponatremia   ??? IBS (irritable bowel syndrome)   ??? Upper GI bleed   ??? S/P LEEP (loop electrosurgical excision procedure)   ??? Arthritis   ??? PONV (postoperative nausea and vomiting)   ??? SVD (spontaneous vaginal delivery)   ??? Urinary tract bacterial infections   ??? Port-A-Cath in place       Past Surgical History:   Procedure Laterality Date   ??? CERVICAL BIOPSY  W/ LOOP ELECTRODE EXCISION     ??? CORONARY ARTERY BYPASS GRAFT     ??? PR RIGHT HEART CATH O2 SATURATION & CARDIAC OUTPUT N/A 08/12/2019    Procedure: Right Heart Catheterization;  Surgeon: Marlaine Hind, MD;  Location: [facility] CATH;  Service: Cardiology   ??? PR TRANSPLANT LIVER,ALLOTRANSPLANT Midline 10/18/2019    Procedure: Liver Allotransplantation; Orthotopic, Partial Or Whole, From Cadaver Or Living Donor, Any Age;  Surgeon: Loney Hering, MD;  Location: MAIN OR [facility];  Service: Transplant   ??? PR TRANSPLANT,PREP DONOR LIVER, WHOLE N/A 10/18/2019    Procedure: Rogelia Boga Std Prep Cad Donor Whole Liver Gft Prior Tnsplnt,Inc Chole,Diss/Rem Surr Tissu Wo Triseg/Lobe Splt;  Surgeon: Loney Hering, MD;  Location: MAIN OR [facility];  Service: Transplant   ??? PR TRANSPLANT,PREP DONOR LIVER/ARTERIAL N/A 10/18/2019    Procedure: BACKBNCH RECONSTRUCT OF CAD/LIVE DONOR LIVER GFT PRIOR TRANSPLANT; ARTERIAL ANASTAMOSIS, EA;  Surgeon: Loney Hering, MD;  Location: MAIN OR [facility];  Service: Transplant   ??? PR UPPER GI ENDOSCOPY,DIAGNOSIS  07/19/2019    Procedure: UGI ENDO, INCLUDE ESOPHAGUS, STOMACH, & DUODENUM &/OR JEJUNUM; DX W/WO COLLECTION SPECIMN, BY BRUSH OR WASH;  Surgeon: Pia Mau, MD;  Location: GI [facility];  Service: Gastroenterology   ??? PR UPPER GI ENDOSCOPY,LIGAT VARIX N/A 04/01/2019    Procedure: UGI ENDO; W/BAND LIG ESOPH &/OR GASTRIC VARICES;  Surgeon: Beverly Milch, MD;  Location: [facility];  Service: Gastroenterology         Current Facility-Administered Medications:   ???  MORPhine injection 1 mg, 1 mg, Intravenous, Once, Jorge Mandril, MD  ???  promethazine (PHENERGAN) 6.25 mg in sodium chloride (NS) 0.9 % 25 mL infusion, 6.25 mg, Intravenous, Once, Jorge Mandril, MD    Current Outpatient Medications:   ???  acetaminophen (TYLENOL) 325 MG tablet, Take 2 tablets (650 mg total) by mouth every six (6) hours as needed for pain or fever (> 100.4 F)., Disp: 100 tablet, Rfl: 0  ???  aspirin (ASPIR-81) 81 MG tablet, Take 1 tablet (81 mg total) by mouth daily., Disp: 100 tablet, Rfl: 10  ???  carvediloL (COREG) 25 MG tablet, Take 1 tablet (25 mg total) by mouth Two (2) times a day., Disp: 60 tablet, Rfl: 11  ???  ergocalciferol-1,250 mcg, 50,000 unit, (DRISDOL) 1,250 mcg (50,000 unit) capsule, Take 1 capsule (1,250 mcg total) by mouth once a week., Disp: 8 capsule, Rfl: 0  ???  fluticasone propionate (FLONASE) 50 mcg/actuation nasal spray, Use 1 spray into each nostril daily as needed for rhinitis., Disp: 16 g, Rfl: 5  ???  folic acid (FOLVITE) 1 MG  tablet, Take 1 tablet (1 mg total) by mouth daily., Disp: 30 tablet, Rfl: 11  ???  loperamide (IMODIUM) 2 mg capsule, Take 2 capsules (4 mg total) by mouth Three (3) times a day as needed for diarrhea., Disp: 100 capsule, Rfl: 5  ???  magnesium oxide-Mg AA chelate (MAGNESIUM, AMINO ACID CHELATE,) 133 mg Tab, Take 1 tablet by mouth Two (2) times a day. HOLD until directed to start by your coordinator., Disp: 100 tablet, Rfl: 11  ???  melatonin 3 mg Tab, Take 1 tablet (3 mg total) by mouth every evening., Disp: 100 tablet, Rfl: 3  ???  multivit with min #53-[id number] (DEKAS PLUS, FOLIC ACID,) 200 mcg-1,000 ZOX-09 mg cap, Take 1 tablet by mouth. Take 1 tablet by mouth daily, Disp: , Rfl:   ???  mycophenolate (MYFORTIC) 180 MG EC tablet, Take 1 tablet (180 mg total) by mouth Two (2) times a day., Disp: 60 tablet, Rfl: 11  ???  NON FORMULARY, Take 1,000 mg by mouth two (2) times a day. Vitafusion Calcium: Take two gummies by mouth twice daily, Disp: , Rfl:   ???  OLANZapine (ZYPREXA) 5 MG tablet, Take 1 tablet (5 mg total) by mouth nightly., Disp: 30 tablet, Rfl: 11  ???  omeprazole (PRILOSEC) 40 MG capsule, Take 1 capsule (40 mg total) by mouth daily., Disp: 30 capsule, Rfl: 5  ???  pentamidine in sterile water (PF), Inhale 6 mL (300 mg total) every twenty-eight (28) days., Disp: 6 mL, Rfl: 5  ??? predniSONE (DELTASONE) 5 MG tablet, Take 1 tablet (5 mg total) by mouth daily., Disp: 30 tablet, Rfl: 11  ???  tacrolimus (PROGRAF) 1 MG capsule, Take 4 capsules (4 mg total) by mouth two (2) times a day., Disp: 270 capsule, Rfl: 11  ???  ursodioL (ACTIGALL) 300 mg capsule, Take 1 capsule (300 mg total) by mouth Three (3) times a day., Disp: 90 capsule, Rfl: 11  ???  valGANciclovir (VALCYTE) 450 mg tablet, Take 1 tablet (450 mg total) by mouth daily., Disp: 30 tablet, Rfl: 2    Allergies  Codeine, Erythromycin, and Fentanyl    History reviewed. No pertinent family history.    Social History  Social History     Tobacco Use   ??? Smoking status: Never Smoker   ??? Smokeless tobacco: Never Used   Substance Use Topics   ??? Alcohol use: Not Currently   ??? Drug use: Not Currently       Review of Systems  Constitutional: Negative for fever.  Eyes: Negative for visual changes.  ENT: Negative for sore throat.  Cardiovascular: Negative for chest pain.  Respiratory: Negative for shortness of breath.  Gastrointestinal: +bilat abdominal pain, +vomiting neg diarrhea.  Genitourinary: Negative for dysuria.   Musculoskeletal: Negative for back pain.  Skin: Negative for rash.  Neurological: Negative for headaches, focal weakness or numbness.      Physical Exam     ED Triage Vitals [12/06/19 0924]   Enc Vitals Group      BP 141/73      Heart Rate 79      SpO2 Pulse       Resp 24      Temp 36.5 ??C (97.7 ??F)      Temp Source Oral      SpO2 99 %      Weight       Height       Head Circumference       Peak Flow  Pain Score       Pain Loc       Pain Edu?       Excl. in GC?        Constitutional: Alert and oriented. Well appearing and in no distress.  Eyes: Conjunctivae are normal.  ENT       Head: Normocephalic and atraumatic.       Nose: No congestion.       Mouth/Throat: Mucous membranes are moist.       Neck: No stridor.  Hematological/Lymphatic/Immunilogical: No cervical lymphadenopathy.  Cardiovascular: Normal rate, regular rhythm. Normal and symmetric distal pulses are present in all extremities.  Respiratory: Normal respiratory effort. Breath sounds are normal.  Gastrointestinal: sig tener RUQ/LUQ. There is no CVA tenderness.  Musculoskeletal: Normal range of motion in all extremities.       Right lower leg: No tenderness or edema.       Left lower leg: No tenderness or edema.  Neurologic: Normal speech and language. No gross focal neurologic deficits are appreciated.  Skin: Skin is warm, dry and intact. No rash noted.  Psychiatric: Mood and affect are normal. Speech and behavior are normal.      EKG   Pending    Radiology   Pending      Procedures   Penidng           Jorge Mandril, MD  Resident  12/06/19 1017

## 2019-12-06 NOTE — Unmapped (Signed)
Received on call page. Called back and pt reported 8/10 abdominal pain since 2am which is not improving. Reviewed pt with Dr. Doyne Keel who recommended bringing pt to the ED. Called pt and relayed info. Notified SRF intern through [employer].

## 2019-12-06 NOTE — Unmapped (Signed)
ED Progress Note    This 49 year old female with an extensive cardiac history and known liver failure 2/2 PBC now s/p liver transplant 10/18/2019 with recent UTI presents again to [facility] ED with complaints of significant epigastric tenderness found to have rising LFTs and elevated lipase concerning for choledocholithiasis and acute pancreatitis.  Current lab work demonstrating AKI, elevated AST/ALT 88/160 with elevated alk phos 696 (GGT 412) and lipase 859.  Currently has been evaluated by GI who are recommending MRCP.  Patient is awaiting study.  Given need for IV antiemetics as well as pain control in the setting of acute pancreatitis, anticipate admission.    3:18 PM  At this time, assumed care of patient.    3:34 PM  SRF paged for admission.     3:54 PM  Patient continuing to have pain.  Orders placed for morphine IV and antiemetics.     5:33 PM  MRCP showing evidence of acute pancreatitis.  GI biliary team will continue to follow for discussion of eventual ERCP.  Spoke with SRS who will come down to evaluate patient and place admit orders.

## 2019-12-07 DIAGNOSIS — K838 Other specified diseases of biliary tract: Secondary | ICD-10-CM | POA: Diagnosis not present

## 2019-12-07 DIAGNOSIS — Z79899 Other long term (current) drug therapy: Secondary | ICD-10-CM | POA: Diagnosis not present

## 2019-12-07 DIAGNOSIS — K859 Acute pancreatitis without necrosis or infection, unspecified: Secondary | ICD-10-CM | POA: Diagnosis not present

## 2019-12-07 DIAGNOSIS — Z944 Liver transplant status: Secondary | ICD-10-CM | POA: Diagnosis not present

## 2019-12-07 DIAGNOSIS — Z5181 Encounter for therapeutic drug level monitoring: Secondary | ICD-10-CM | POA: Diagnosis not present

## 2019-12-07 DIAGNOSIS — K9189 Other postprocedural complications and disorders of digestive system: Secondary | ICD-10-CM | POA: Diagnosis not present

## 2019-12-07 LAB — URINALYSIS WITH CULTURE REFLEX
BACTERIA: NONE SEEN /HPF
BLOOD UA: NEGATIVE
GLUCOSE UA: NEGATIVE
KETONES UA: NEGATIVE
LEUKOCYTE ESTERASE UA: NEGATIVE
PH UA: 5 (ref 5.0–9.0)
PROTEIN UA: 30 — AB
RBC UA: <1 /HPF (ref <=4)
SPECIFIC GRAVITY UA: 1.015 (ref 1.003–1.030)
SQUAMOUS EPITHELIAL: <1 /HPF (ref 0–5)
WBC UA: <1 /HPF (ref 0–5)

## 2019-12-07 LAB — HEPATIC FUNCTION PANEL
ALBUMIN: 2.8 g/dL — ABNORMAL LOW (ref 3.4–5.0)
ALKALINE PHOSPHATASE: 613 U/L — ABNORMAL HIGH (ref 46–116)
AST (SGOT): 104 U/L — ABNORMAL HIGH (ref <=34)
BILIRUBIN DIRECT: 2.7 mg/dL — ABNORMAL HIGH (ref 0.00–0.30)
BILIRUBIN TOTAL: 3.3 mg/dL — ABNORMAL HIGH (ref 0.3–1.2)
PROTEIN TOTAL: 5.4 g/dL — ABNORMAL LOW (ref 5.7–8.2)

## 2019-12-07 LAB — TACROLIMUS, TROUGH
Lab: 5.9
Lab: 9.2

## 2019-12-07 LAB — BASIC METABOLIC PANEL
ANION GAP: 8 mmol/L (ref 5–14)
BLOOD UREA NITROGEN: 17 mg/dL (ref 9–23)
BUN / CREAT RATIO: 17
CALCIUM: 8.8 mg/dL (ref 8.7–10.4)
CHLORIDE: 106 mmol/L (ref 98–107)
CREATININE: 1.01 mg/dL
EGFR CKD-EPI AA FEMALE: 76 mL/min/{1.73_m2} (ref >=60)
GLUCOSE RANDOM: 103 mg/dL (ref 70–179)
POTASSIUM: 4.3 mmol/L (ref 3.4–4.5)
SODIUM: 135 mmol/L (ref 135–145)

## 2019-12-07 LAB — LIPASE: Triacylglycerol lipase:CCnc:Pt:Ser/Plas:Qn:: 985 — ABNORMAL HIGH

## 2019-12-07 LAB — MEAN CORPUSCULAR HEMOGLOBIN: Erythrocyte mean corpuscular hemoglobin:EntMass:Pt:RBC:Qn:Automated count: 33.5

## 2019-12-07 LAB — CBC
HEMATOCRIT: 27.1 % — ABNORMAL LOW (ref 36.0–46.0)
HEMOGLOBIN: 8.8 g/dL — ABNORMAL LOW (ref 12.0–16.0)
MEAN CORPUSCULAR HEMOGLOBIN: 33.5 pg (ref 26.0–34.0)
MEAN CORPUSCULAR VOLUME: 102.9 fL — ABNORMAL HIGH (ref 80.0–100.0)
MEAN PLATELET VOLUME: 7.8 fL (ref 7.0–10.0)
RED BLOOD CELL COUNT: 2.63 10*12/L — ABNORMAL LOW (ref 4.00–5.20)
RED CELL DISTRIBUTION WIDTH: 17.8 % — ABNORMAL HIGH (ref 12.0–15.0)
WBC ADJUSTED: 7.2 10*9/L (ref 4.5–11.0)

## 2019-12-07 LAB — PROTEIN TOTAL: Protein:MCnc:Pt:Ser/Plas:Qn:: 5.4 — ABNORMAL LOW

## 2019-12-07 LAB — PHOSPHORUS: Phosphate:MCnc:Pt:Ser/Plas:Qn:: 3.8

## 2019-12-07 LAB — PH UA: pH:LsCnc:Pt:Urine:Qn:Test strip: 5

## 2019-12-07 LAB — BLOOD UREA NITROGEN: Urea nitrogen:MCnc:Pt:Ser/Plas:Qn:: 17

## 2019-12-07 LAB — MAGNESIUM: Magnesium:MCnc:Pt:Ser/Plas:Qn:: 1.8

## 2019-12-07 MED ADMIN — promethazine (PHENERGAN) 12.5 mg in sodium chloride (NS) 0.9 % 25 mL infusion: 12.5 mg | INTRAVENOUS | @ 16:00:00

## 2019-12-07 MED ADMIN — ondansetron (ZOFRAN) injection: INTRAVENOUS | @ 19:00:00 | Stop: 2019-12-07

## 2019-12-07 MED ADMIN — predniSONE (DELTASONE) tablet 5 mg: 5 mg | ORAL | @ 01:00:00

## 2019-12-07 MED ADMIN — fentaNYL (PF) (SUBLIMAZE) injection: INTRAVENOUS | @ 19:00:00 | Stop: 2019-12-07

## 2019-12-07 MED ADMIN — promethazine (PHENERGAN) injection: INTRAVENOUS | @ 19:00:00 | Stop: 2019-12-07

## 2019-12-07 MED ADMIN — oxyCODONE (ROXICODONE) immediate release tablet 10 mg: 10 mg | ORAL | @ 13:00:00 | Stop: 2019-12-20

## 2019-12-07 MED ADMIN — valGANciclovir (VALCYTE) tablet 450 mg: 450 mg | ORAL | @ 01:00:00

## 2019-12-07 MED ADMIN — piperacillin-tazobactam (ZOSYN) injection: INTRAVENOUS | @ 20:00:00 | Stop: 2019-12-07

## 2019-12-07 MED ADMIN — tacrolimus (PROGRAF) capsule 4 mg: 4 mg | ORAL | @ 01:00:00

## 2019-12-07 MED ADMIN — dexamethasone (DECADRON) 4 mg/mL injection: INTRAVENOUS | @ 19:00:00 | Stop: 2019-12-07

## 2019-12-07 MED ADMIN — tacrolimus (PROGRAF) capsule 4 mg: 4 mg | ORAL | @ 13:00:00

## 2019-12-07 MED ADMIN — fentaNYL (PF) (SUBLIMAZE) injection: INTRAVENOUS | @ 20:00:00 | Stop: 2019-12-07

## 2019-12-07 MED ADMIN — lidocaine (XYLOCAINE) 20 mg/mL (2 %) injection: INTRAVENOUS | @ 19:00:00 | Stop: 2019-12-07

## 2019-12-07 MED ADMIN — lactated Ringers infusion: 10 mL/h | INTRAVENOUS | @ 18:00:00 | Stop: 2019-12-07

## 2019-12-07 MED ADMIN — pantoprazole (PROTONIX) EC tablet 20 mg: 20 mg | ORAL | @ 04:00:00

## 2019-12-07 MED ADMIN — pantoprazole (PROTONIX) EC tablet 20 mg: 20 mg | ORAL | @ 13:00:00

## 2019-12-07 MED ADMIN — promethazine (PHENERGAN) 12.5 mg in sodium chloride (NS) 0.9 % 25 mL infusion: 12.5 mg | INTRAVENOUS | @ 09:00:00

## 2019-12-07 MED ADMIN — indomethacin (INDOCIN) 50 mg suppository: RECTAL | @ 19:00:00 | Stop: 2019-12-07

## 2019-12-07 MED ADMIN — propofoL (DIPRIVAN) injection: INTRAVENOUS | @ 19:00:00 | Stop: 2019-12-07

## 2019-12-07 MED ADMIN — succinylcholine chloride (ANECTINE) injection: INTRAVENOUS | @ 19:00:00 | Stop: 2019-12-07

## 2019-12-07 MED ADMIN — ondansetron (ZOFRAN) injection 4 mg: 4 mg | INTRAVENOUS | @ 13:00:00

## 2019-12-07 MED ADMIN — propofol (DIPRIVAN) infusion 10 mg/mL: INTRAVENOUS | @ 19:00:00 | Stop: 2019-12-07

## 2019-12-07 MED ADMIN — oxyCODONE (ROXICODONE) immediate release tablet 5 mg: 5 mg | ORAL | @ 03:00:00 | Stop: 2019-12-20

## 2019-12-07 MED ADMIN — promethazine (PHENERGAN) 6.25 mg in sodium chloride (NS) 0.9 % 25 mL infusion: 6.25 mg | INTRAVENOUS | @ 02:00:00

## 2019-12-07 MED ADMIN — carvediloL (COREG) tablet 25 mg: 25 mg | ORAL | @ 13:00:00

## 2019-12-07 NOTE — Unmapped (Signed)
Tacrolimus Therapeutic Monitoring Pharmacy Note    Kathy Armstrong is a 49 y.o. female continuing tacrolimus.     Indication: Liver transplant     Date of Transplant: 10/18/19      Prior Dosing Information: Home regimen Tacrolimus 4 mg PO BID     Goals:  Therapeutic Drug Levels  Tacrolimus trough goal: 8-10 ng/mL    Additional Clinical Monitoring/Outcomes  ?? Monitor renal function (SCr and urine output) and liver function (LFTs)  ?? Monitor for signs/symptoms of adverse events (e.g., hyperglycemia, hyperkalemia, hypomagnesemia, hypertension, headache, tremor)    Results:   Tacrolimus level: level in clinic on 10/11 was within goal range at 9.4 ng/mL    Pharmacokinetic Considerations and Significant Drug Interactions:  ??? Concurrent hepatotoxic medications: None identified  ??? Concurrent CYP3A4 substrates/inhibitors: None identified  ??? Concurrent nephrotoxic medications: None identified    Assessment/Plan:  Recommendedation(s)  ??? Continue home regimen of tacrolimus 4 mg PO BID    Follow-up  ??? Next level to be determined by primary team.   ??? A pharmacist will continue to monitor and recommend levels as appropriate    Please page service pharmacist with questions/clarifications.    Christene Lye, PharmD

## 2019-12-07 NOTE — Unmapped (Signed)
Pt arrived to floor from ED. Pt oriented to room, falls precautions in place. Pt VSS. Pt sister staying in room. Pt experiencing nausea and Phenergen is the first line. Fluids (NSS) running at 150cc/hr. Chest port right side, labs collected.  Problem: Adult Inpatient Plan of Care  Goal: Absence of Hospital-Acquired Illness or Injury  Intervention: Prevent Skin Injury  Recent Flowsheet Documentation  Taken 12/07/2019 0400 by Georgette Shell, RN  Skin Protection: tubing/devices free from skin contact  Intervention: Prevent and Manage VTE (Venous Thromboembolism) Risk  Recent Flowsheet Documentation  Taken 12/07/2019 0400 by Georgette Shell, RN  Activity Management: ambulated to bathroom     Problem: Impaired Wound Healing  Goal: Optimal Wound Healing  Intervention: Promote Wound Healing  Recent Flowsheet Documentation  Taken 12/07/2019 0400 by Georgette Shell, RN  Activity Management: ambulated to bathroom

## 2019-12-07 NOTE — Unmapped (Signed)
Pt taken on stretcher by Presenter, broadcasting on monitor. Pt belongings at bedside, pt family member also at bedside. Report given prior to transport, care transferred at this time.

## 2019-12-07 NOTE — Unmapped (Signed)
Social Work  Psychosocial Assessment    Patient Name: Kathy Armstrong   Medical Record Number: [id number]   Date of Birth: 1970/07/10  Sex: Female     Referral  Referred by: Care Manager  Reason for Referral: Complex Discharge Planning  No Psychosocial Interventions Necessary: No Psychosocial Interventions Necessary    Extended Emergency Contact Information  Primary Emergency Contact: Crutcher,Stephanie  Mobile Phone: [phone]  Relation: Sister  Preferred language: ENGLISH  Interpreter needed? No  Secondary Emergency Contact: Buser,Christopher   [place]  Home Phone: [phone]  Relation: Friend  Preferred language: ENGLISH  Interpreter needed? No    Legal Next of Kin / Guardian / POA / Advance Directives    HCDM (patient stated preference): Bufkin,Christopher - Friend [phone]    HCDM, First Alternate: Crutcher,Stephanie - Sister - [phone]    Advance Directive (Medical Treatment)  Does patient have an advance directive covering medical treatment?: Patient does not have advance directive covering medical treatment.    Health Care Decision Maker [HCDM] (Medical & Mental Health Treatment)  Healthcare Decision Maker: Patient needs follow-up to appoint a Health Care Decision Maker.  Information offered on HCDM, Medical & Mental Health advance directives:: Referral made for patient.         Discharge Planning  Discharge Planning Information:   Type of Residence   Mailing Address:  [address] [place]  [place] [address]    Medical Information   Past Medical History:   Diagnosis Date   ??? CAD (coronary artery disease)     CABG   ??? Hiatal hernia    ??? Osteopenia    ??? Primary biliary cholangitis (CMS-HCC)        Past Surgical History:   Procedure Laterality Date   ??? CERVICAL BIOPSY  W/ LOOP ELECTRODE EXCISION     ??? CORONARY ARTERY BYPASS GRAFT     ??? PR ERCP BALLOON DILATE BILIARY/PANC DUCT/AMPULLA EA N/A 12/06/2019    Procedure: ERCP;WITH TRANS-ENDOSCOPIC BALLOON DILATION OF BILIARY/PANCREATIC DUCT(S) OR OF AMPULLA, INCLUDING SPHINCTERECTOMY, WHEN PERFOREMD,EACH DUCT (25956);  Surgeon: Jules Husbands, MD;  [facility]: [facility];  Service: Gastroenterology   ??? PR RIGHT HEART CATH O2 SATURATION & CARDIAC OUTPUT N/A 08/12/2019    Procedure: Right Heart Catheterization;  Surgeon: Marlaine Hind, MD;  Location: [facility] CATH;  Service: Cardiology   ??? PR TRANSPLANT LIVER,ALLOTRANSPLANT Midline 10/18/2019    Procedure: Liver Allotransplantation; Orthotopic, Partial Or Whole, From Cadaver Or Living Donor, Any Age;  Surgeon: Loney Hering, MD;  Location: MAIN OR [facility];  Service: Transplant   ??? PR TRANSPLANT,PREP DONOR LIVER, WHOLE N/A 10/18/2019    Procedure: Rogelia Boga Std Prep Cad Donor Whole Liver Gft Prior Tnsplnt,Inc Chole,Diss/Rem Surr Tissu Wo Triseg/Lobe Splt;  Surgeon: Loney Hering, MD;  Location: MAIN OR [facility];  Service: Transplant   ??? PR TRANSPLANT,PREP DONOR LIVER/ARTERIAL N/A 10/18/2019    Procedure: BACKBNCH RECONSTRUCT OF CAD/LIVE DONOR LIVER GFT PRIOR TRANSPLANT; ARTERIAL ANASTAMOSIS, EA;  Surgeon: Loney Hering, MD;  Location: MAIN OR [facility];  Service: Transplant   ??? PR UPPER GI ENDOSCOPY,DIAGNOSIS  07/19/2019    Procedure: UGI ENDO, INCLUDE ESOPHAGUS, STOMACH, & DUODENUM &/OR JEJUNUM; DX W/WO COLLECTION SPECIMN, BY BRUSH OR WASH;  Surgeon: Pia Mau, MD;  Location: [facility];  Service: Gastroenterology   ??? PR UPPER GI ENDOSCOPY,LIGAT VARIX N/A 04/01/2019    Procedure: UGI ENDO; W/BAND LIG ESOPH &/OR GASTRIC VARICES;  Surgeon: Beverly Milch, MD;  Location: GI [facility]URES [facility] [place];  Service: Gastroenterology       History reviewed. No pertinent family history.    Financial Information   Primary Insurance: Payor: UMR / Plan: UMR / Product Type: *No Product type* /    Secondary Insurance: None   Prescription Coverage: Nurse, learning disability (listed above)   Preferred Pharmacy: [facility] - [place], [place] - [address]  [facility]  [facility]    Barriers to taking medication: No    Transition Home   Transportation at time of discharge: Family/Friend's Private Vehicle    Anticipated changes related to Illness: s/p liver transplant   Services in place prior to admission: outpatient PT   Services anticipated for DC: TBD   Hemodialysis Prior to Admission: No    Readmission  Risk of Unplanned Readmission Score: UNPLANNED READMISSION SCORE: 50%  Readmitted Within the Last 30 Days? Yes  Readmission Factors include: other: s/p liver transplant    Social Determinants of Health  Social Determinants of Health were addressed in provider documentation.  Please refer to patient history.    Social History  Support Systems/Concerns: Family Members, Children, Education officer, museum Service: No Marine scientist and Psychiatric History  Psychosocial Stressors: Denies     Psychological Issues/Information: No issues     Chemical Dependency: None    Outpatient Providers: Specialist   Name / Solicitor #: Conservator, museum/gallery [facility]     Legal: No legal issues    Ability to [employer]: No issues accessing community services

## 2019-12-07 NOTE — Unmapped (Cosign Needed)
Transplant Surgery H&P Note    12/06/2019    Assessment/Plan:    Kathy Armstrong is a 49 y.o. 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    - Admit to SRF, stepdown status  - NPO sips with meds  - IVF @ 150cc/hr  - Medications for pain, nausea  - Home medications restarted  - AM labs        History of Present Illness:     Kathy Armstrong is a 49 y.o. 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 Patient reports that she woke up from the pain at around 2 AM this morning. Associated with multiple episodes of nausea and vomiting. Has not been able to tolerate any PO intake. Denied any recent fevers or chills. Having regular BMs.    In the ED, patient is afebrile with normal HR and SBPs 100s-130s. Workup is notable for Cr 1.15, Tbili 1.3, AST 88, ALT 160, alk phos 696. Her liver function tests are downtrending from labs drawn yesterday in clinic. MRCP demonstrated biliary anastomotic stricture as well as acute pancreatitis.    Of note, given patient's recent rise in LFTs, a liver ultrasound was obtained in clinic yesterday which demonstrated patent hepatic transplant vasculature and stable RIs.      Past Medical History    Past Medical History:   Diagnosis Date   ??? CAD (coronary artery disease)    ??? Hiatal hernia    ??? Osteopenia    ??? Primary biliary cholangitis (CMS-HCC)        Past Surgical History    Past Surgical History:   Procedure Laterality Date   ??? CERVICAL BIOPSY  W/ LOOP ELECTRODE EXCISION     ??? CORONARY ARTERY BYPASS GRAFT     ??? PR RIGHT HEART CATH O2 SATURATION & CARDIAC OUTPUT N/A 08/12/2019    Procedure: Right Heart Catheterization;  Surgeon: Marlaine Hind, MD;  Location: [facility] CATH;  Service: Cardiology   ??? PR TRANSPLANT LIVER,ALLOTRANSPLANT Midline 10/18/2019    Procedure: Liver Allotransplantation; Orthotopic, Partial Or Whole, From Cadaver Or Living Donor, Any Age;  Surgeon: Loney Hering, MD;  Location: MAIN OR [facility];  Service: Transplant   ??? PR TRANSPLANT,PREP DONOR LIVER, WHOLE N/A 10/18/2019    Procedure: Rogelia Boga Std Prep Cad Donor Whole Liver Gft Prior Tnsplnt,Inc Chole,Diss/Rem Surr Tissu Wo Triseg/Lobe Splt;  Surgeon: Loney Hering, MD;  Location: MAIN OR [facility];  Service: Transplant   ??? PR TRANSPLANT,PREP DONOR LIVER/ARTERIAL N/A 10/18/2019    Procedure: BACKBNCH RECONSTRUCT OF CAD/LIVE DONOR LIVER GFT PRIOR TRANSPLANT; ARTERIAL ANASTAMOSIS, EA;  Surgeon: Loney Hering, MD;  Location: MAIN OR [facility];  Service: Transplant   ??? PR UPPER GI ENDOSCOPY,DIAGNOSIS  07/19/2019    Procedure: UGI ENDO, INCLUDE ESOPHAGUS, STOMACH, & DUODENUM &/OR JEJUNUM; DX W/WO COLLECTION SPECIMN, BY BRUSH OR WASH;  Surgeon: Pia Mau, MD;  Location: GI PROC[facility]URES [facility];  Service: Gastroenterology   ??? PR UPPER GI ENDOSCOPY,LIGAT VARIX N/A 04/01/2019    Procedure: UGI ENDO; W/BAND LIG ESOPH &/OR GASTRIC VARICES;  Surgeon: Beverly Milch,  MD;  Location: GI PROCEDURES [facility];  Service: Gastroenterology       Medications      Current Facility-Administered Medications on File Prior to Encounter   Medication   ??? [EXPIRED] heparin, porcine (PF) 100 unit/mL injection 500 Units     Current Outpatient Medications on File Prior to Encounter   Medication Sig   ??? acetaminophen (TYLENOL) 500 MG tablet Take 1,000 mg by mouth every eight (8) hours as needed.   ??? alirocumab (PRALUENT PEN) 150 mg/mL subcutaneous injection Inject 150 mg under the skin every fourteen (14) days.   ??? loratadine (CLARITIN) 10 mg tablet Take 10 mg by mouth daily.   ??? ondansetron (ZOFRAN-ODT) 8 MG disintegrating tablet Take 8 mg by mouth every eight (8) hours as needed.   ??? acetaminophen (TYLENOL) 325 MG tablet Take 2 tablets (650 mg total) by mouth every six (6) hours as needed for pain or fever (> 100.4 F).   ??? aspirin (ASPIR-81) 81 MG tablet Take 1 tablet (81 mg total) by mouth daily.   ??? carvediloL (COREG) 25 MG tablet Take 1 tablet (25 mg total) by mouth Two (2) times a day.   ??? ergocalciferol-1,250 mcg, 50,000 unit, (DRISDOL) 1,250 mcg (50,000 unit) capsule Take 1 capsule (1,250 mcg total) by mouth once a week.   ??? fluticasone propionate (FLONASE) 50 mcg/actuation nasal spray Use 1 spray into each nostril daily as needed for rhinitis.   ??? folic acid (FOLVITE) 1 MG tablet Take 1 tablet (1 mg total) by mouth daily.   ??? loperamide (IMODIUM) 2 mg capsule Take 2 capsules (4 mg total) by mouth Three (3) times a day as needed for diarrhea.   ??? magnesium oxide-Mg AA chelate (MAGNESIUM, AMINO ACID CHELATE,) 133 mg Tab Take 1 tablet by mouth Two (2) times a day. HOLD until directed to start by your coordinator.   ??? melatonin 3 mg Tab Take 1 tablet (3 mg total) by mouth every evening.   ??? multivit with min #53-[id number] (DEKAS PLUS, FOLIC ACID,) 200 mcg-1,000 ZOX-09 mg cap Take 1 tablet by mouth. Take 1 tablet by mouth daily   ??? mycophenolate (MYFORTIC) 180 MG EC tablet Take 1 tablet (180 mg total) by mouth Two (2) times a day.   ??? NON FORMULARY Take 1,000 mg by mouth two (2) times a day. Vitafusion Calcium: Take two gummies by mouth twice daily   ??? OLANZapine (ZYPREXA) 5 MG tablet Take 1 tablet (5 mg total) by mouth nightly.   ??? omeprazole (PRILOSEC) 40 MG capsule Take 1 capsule (40 mg total) by mouth daily.   ??? pentamidine in sterile water (PF) Inhale 6 mL (300 mg total) every twenty-eight (28) days.   ??? predniSONE (DELTASONE) 5 MG tablet Take 1 tablet (5 mg total) by mouth daily.   ??? tacrolimus (PROGRAF) 1 MG capsule Take 4 capsules (4 mg total) by mouth two (2) times a day.   ??? ursodioL (ACTIGALL) 300 mg capsule Take 1 capsule (300 mg total) by mouth Three (3) times a day.   ??? valGANciclovir (VALCYTE) 450 mg tablet Take 1 tablet (450 mg total) by mouth daily.       Allergies    Codeine, Erythromycin, and Fentanyl      Family History    History reviewed. No pertinent family history.      Social History:    Social History     Tobacco Use   ??? Smoking status: Never Smoker   ??? Smokeless tobacco: Never  Used   Substance Use Topics   ??? Alcohol use: Not Currently   ??? Drug use: Not Currently         Review of Systems    A 10 system review of systems was negative except as noted in HPI.      Vital Signs  BP 134/64  - Pulse 78  - Temp 36.2 ??C (Temporal)  - Resp 23  - SpO2 100%   There is no height or weight on file to calculate BMI.    Physical Exam  General Appearance: Appears tired but in no acute distress. Alert and oriented.   Pulmonary: Normal respiratory effort on room air.  Cardiovascular: Normal rate.  Abdomen: Soft, non-distended, tender to palpation in epigastric reigion and LUQ. Well-healed Mercedes incision.  Extremities: Warm and well perfused, no edema  Neurologic: No motor abnormalities noted. Sensation grossly intact.  Skin: Skin color normal. No rashes or lesions.      Data Review  Labs:  All lab results last 24 hours:    Recent Results (from the past 24 hour(s))   ECG 12 Lead    Collection Time: 12/06/19  9:43 AM   Result Value Ref Range    EKG Systolic BP  mmHg    EKG Diastolic BP  mmHg    EKG Ventricular Rate 67 BPM    EKG Atrial Rate 67 BPM    EKG P-R Interval 156 ms    EKG QRS Duration 92 ms    EKG Q-T Interval 442 ms    EKG QTC Calculation 467 ms    EKG Calculated P Axis 27 degrees    EKG Calculated R Axis 18 degrees    EKG Calculated T Axis 51 degrees    QTC Fredericia 458 ms   Lactic Acid, Venous, Whole Blood    Collection Time: 12/06/19 10:03 AM   Result Value Ref Range    Lactate, Venous 0.8 0.5 - 1.8 mmol/L   Protime-INR    Collection Time: 12/06/19 10:03 AM   Result Value Ref Range    PT 11.8 10.3 - 13.4 sec    INR 1.01    Gamma GT (GGT) Collection Time: 12/06/19 10:03 AM   Result Value Ref Range    GGT 412 (H) 0 - 38 U/L   Magnesium Level    Collection Time: 12/06/19 10:03 AM   Result Value Ref Range    Magnesium 2.0 1.6 - 2.6 mg/dL   Phosphorus Level    Collection Time: 12/06/19 10:03 AM   Result Value Ref Range    Phosphorus 4.3 2.4 - 5.1 mg/dL   Comprehensive metabolic panel    Collection Time: 12/06/19 10:04 AM   Result Value Ref Range    Sodium 133 (L) 135 - 145 mmol/L    Potassium 3.8 3.4 - 4.5 mmol/L    Chloride 106 98 - 107 mmol/L    Anion Gap 7 5 - 14 mmol/L    CO2 20.0 20.0 - 31.0 mmol/L    BUN 13 9 - 23 mg/dL    Creatinine 1.61 (H) 0.55 - 1.02 mg/dL    BUN/Creatinine Ratio 11     EGFR CKD-EPI Non-African American, Female 56 (L) >=60 mL/min/1.26m2    EGFR CKD-EPI African American, Female 65 >=60 mL/min/1.40m2    Glucose 126 70 - 179 mg/dL    Calcium 8.9 8.7 - 09.6 mg/dL    Albumin 3.3 (L) 3.4 - 5.0 g/dL    Total Protein 6.0 5.7 - 8.2 g/dL  Total Bilirubin 1.3 (H) 0.3 - 1.2 mg/dL    AST 88 (H) <=16 U/L    ALT 160 (H) 10 - 49 U/L    Alkaline Phosphatase 696 (H) 46 - 116 U/L   Lipase    Collection Time: 12/06/19 10:04 AM   Result Value Ref Range    Lipase 859 (H) 12 - 53 U/L   Bilirubin, Direct    Collection Time: 12/06/19 10:04 AM   Result Value Ref Range    Bilirubin, Direct 1.00 (H) 0.00 - 0.30 mg/dL   Respiratory Pathogen Panel with COVID-19    Collection Time: 12/06/19 10:04 AM   Result Value Ref Range    Adenovirus Not Detected Not Detected    Coronavirus HKU1 Not Detected Not Detected    Coronavirus NL63 Not Detected Not Detected    Coronavirus 229E Not Detected Not Detected    Coronavirus OC43 PCR Not Detected Not Detected    Metapneumovirus Not Detected Not Detected    Rhinovirus/Enterovirus Not Detected Not Detected    Influenza A Not Detected Not Detected    Influenza B Not Detected Not Detected    Parainfluenza 1 Not Detected Not Detected    Parainfluenza 2 Not Detected Not Detected    Parainfluenza 3 Not Detected Not Detected    Parainfluenza 4 Not Detected Not Detected    RSV Not Detected Not Detected    Bordetella pertussis Not Detected Not Detected    Bordetella parapertussis Not Detected Not Detected    Chlamydophila (Chlamydia) pneumoniae Not Detected Not Detected    Mycoplasma pneumoniae Not Detected Not Detected    SARS-CoV-2 PCR Not Detected Not Detected   CBC w/ Differential    Collection Time: 12/06/19 10:04 AM   Result Value Ref Range    WBC 5.8 4.5 - 11.0 10*9/L    RBC 2.68 (L) 4.00 - 5.20 10*12/L    HGB 9.2 (L) 12.0 - 16.0 g/dL    HCT 10.9 (L) 60.4 - 46.0 %    MCV 103.3 (H) 80.0 - 100.0 fL    MCH 34.1 (H) 26.0 - 34.0 pg    MCHC 33.1 31.0 - 37.0 g/dL    RDW 54.0 (H) 98.1 - 15.0 %    MPV 7.4 7.0 - 10.0 fL    Platelet 215 150 - 440 10*9/L    Neutrophils % 88.4 %    Lymphocytes % 5.4 %    Monocytes % 4.9 %    Eosinophils % 0.4 %    Basophils % 0.3 %    Neutrophil Left Shift 1+ (A) Not Present    Absolute Neutrophils 5.1 2.0 - 7.5 10*9/L    Absolute Lymphocytes 0.3 (L) 1.5 - 5.0 10*9/L    Absolute Monocytes 0.3 0.2 - 0.8 10*9/L    Absolute Eosinophils 0.0 0.0 - 0.4 10*9/L    Absolute Basophils 0.0 0.0 - 0.1 10*9/L    Large Unstained Cells 1 0 - 4 %    Macrocytosis Marked (A) Not Present    Anisocytosis Slight (A) Not Present    Hypochromasia Slight (A) Not Present   Morphology Review    Collection Time: 12/06/19 10:04 AM   Result Value Ref Range    Smear Review Comments See Comment (A) Undefined    Hypersegmented Neutrophils Present (A) Not Present   ECG 12 lead    Collection Time: 12/06/19  6:41 PM   Result Value Ref Range    EKG Systolic BP  mmHg    EKG Diastolic BP  mmHg    EKG Ventricular Rate 71 BPM    EKG Atrial Rate 71 BPM    EKG P-R Interval 156 ms    EKG QRS Duration 84 ms    EKG Q-T Interval 426 ms    EKG QTC Calculation 462 ms    EKG Calculated P Axis 39 degrees    EKG Calculated R Axis 24 degrees    EKG Calculated T Axis 47 degrees    QTC Fredericia 450 ms       Imaging:  ECG 12 lead    Result Date: 12/06/2019  NORMAL SINUS RHYTHM NORMAL ECG WHEN COMPARED WITH ECG OF 06-Dec-2019 09:43, NO SIGNIFICANT CHANGE WAS FOUND    ECG 12 Lead    Result Date: 12/06/2019  NORMAL SINUS RHYTHM NONSPECIFIC ST ABNORMALITY ABNORMAL ECG WHEN COMPARED WITH ECG OF 08-Nov-2019 07:26, WOLFF-PARKINSON-WHITE IS NO LONGER PRESENT    XR Chest Portable    Result Date: 12/06/2019  EXAM: XR CHEST PORTABLE DATE: 12/06/2019 9:44 AM ACCESSION: [id number] UN DICTATED: 12/06/2019 9:52 AM INTERPRETATION LOCATION: [facility] CLINICAL INDICATION: 49 years old Female with DYSPNEA  COMPARISON: 11/30/2019 chest radiograph. TECHNIQUE: Portable Chest Radiograph. FINDINGS: Similar moderate right pleural effusion with adjacent streaky and patchy opacities. No pneumothorax. No left pleural effusion. Unchanged sequelae of sternotomy. Stable cardiomediastinal silhouette which is partially obscured..     Similar moderate right pleural effusion. Similar streaky and patchy opacities adjacent to the right pleural effusion likely representing atelectasis. Differential also includes infection versus aspiration. .    MRI Abdomen W Wo Contrast MRCP    Result Date: 12/06/2019  EXAM: MRI abdomen with and without contrast, MRCP DATE: 12/06/2019 5:33 PM ACCESSION: [id number] UN DICTATED: 12/06/2019 6:20 PM INTERPRETATION LOCATION: [facility] CLINICAL INDICATION: 49 years old Female with r/o pancreatitis ; Pancreatitis suspected  COMPARISON: Abdominal MRI 04/07/2019 TECHNIQUE: MRI of the abdomen was obtained with and without IV contrast.  Multisequence, multiplanar and dedicated T2 weighted images that highlight the biliary tree were obtained. FINDINGS: LOWER THORAX: Moderate right pleural effusion. HEPATOBILIARY: The liver is cirrhotic with a nodular contour and left hepatic hypertrophy. No arterial enhancing or washout lesions are present. No biliary ductal dilatation. Gallbladder is unremarkable. PANCREAS: Modified restricted diffusion throughout the pancreatic body and tail with slightly increased T1 signal intensity. SPLEEN: Mild splenomegaly measuring 15.1 cm in craniocaudal dimension. ADRENAL GLANDS: Unremarkable. KIDNEYS/URETERS: Symmetric nephrograms. No hydronephrosis. BOWEL/PERITONEUM/RETROPERITONEUM: Edematous and thickened appearance of the ascending colon likely related to portal colopathy. Diffuse abdominopelvic ascites. Mesenteric stranding the region of the epigastrium. VASCULATURE: Abdominal aorta within normal limits for patient's age. Portal and hepatic veins are patent.  Patent portal venous system. Prominent left greater than right gonadal veins. Small moderate caliber perisplenic, perigastric, and paraesophageal varices. Recanalization of the umbilical vein. Unremarkable inferior vena cava. LYMPH NODES: No adenopathy. BONES/SOFT TISSUES: Ascitic fluid-containing umbilical hernia. Poststernotomy. No enhancing osseous lesions. Mild body wall edema.     -- Diffuse restricted diffusion throughout the pancreas with mesenteric stranding in the epigastrium, which could be seen with acute pancreatitis. There is also slightly increased T1 signal throughout the pancreatic body which could be seen with hemorrhagic pancreatitis versus normal pancreatic enzymes. Recommend clinical correlation. -- Cirrhotic liver morphology with stigmata of portal hypertension including diffuse abdominopelvic ascites and splenomegaly. ==================== ADDENDUM (12/06/2019 8:39 PM): On review, the following additional findings were noted: Status post liver transplant without evidence of cirrhosis. Narrowing of the level of the anastomosis in the common bile duct with associated moderate upstream intra and extrahepatic biliary  ductal dilatation. Pancreas demonstrates diffusely decreased T1 signal, probably related to prior/current inflammation with surrounding edema. Findings are suspicious for acute interstitial pancreatitis. Correlate with serum amylase and lipase.

## 2019-12-08 DIAGNOSIS — Z944 Liver transplant status: Principal | ICD-10-CM

## 2019-12-08 DIAGNOSIS — Z79899 Other long term (current) drug therapy: Principal | ICD-10-CM

## 2019-12-08 DIAGNOSIS — Z09 Encounter for follow-up examination after completed treatment for conditions other than malignant neoplasm: Secondary | ICD-10-CM | POA: Diagnosis not present

## 2019-12-08 DIAGNOSIS — K831 Obstruction of bile duct: Secondary | ICD-10-CM | POA: Diagnosis not present

## 2019-12-08 DIAGNOSIS — K9189 Other postprocedural complications and disorders of digestive system: Secondary | ICD-10-CM | POA: Diagnosis not present

## 2019-12-08 DIAGNOSIS — Z5181 Encounter for therapeutic drug level monitoring: Secondary | ICD-10-CM | POA: Diagnosis not present

## 2019-12-08 LAB — TACROLIMUS, TROUGH: Lab: 10.1

## 2019-12-08 LAB — BASIC METABOLIC PANEL
ANION GAP: 8 mmol/L (ref 5–14)
BLOOD UREA NITROGEN: 23 mg/dL (ref 9–23)
BUN / CREAT RATIO: 18
CALCIUM: 8.1 mg/dL — ABNORMAL LOW (ref 8.7–10.4)
CHLORIDE: 106 mmol/L (ref 98–107)
CO2: 21 mmol/L (ref 20.0–31.0)
CREATININE: 1.26 mg/dL — ABNORMAL HIGH
EGFR CKD-EPI AA FEMALE: 58 mL/min/{1.73_m2} — ABNORMAL LOW (ref >=60)
GLUCOSE RANDOM: 69 mg/dL — ABNORMAL LOW (ref 70–179)
POTASSIUM: 4.8 mmol/L — ABNORMAL HIGH (ref 3.4–4.5)
SODIUM: 135 mmol/L (ref 135–145)

## 2019-12-08 LAB — HEPATIC FUNCTION PANEL
ALBUMIN: 2.6 g/dL — ABNORMAL LOW (ref 3.4–5.0)
ALT (SGPT): 107 U/L — ABNORMAL HIGH (ref 10–49)
BILIRUBIN DIRECT: 0.8 mg/dL — ABNORMAL HIGH (ref 0.00–0.30)
BILIRUBIN TOTAL: 1.1 mg/dL (ref 0.3–1.2)
PROTEIN TOTAL: 5.1 g/dL — ABNORMAL LOW (ref 5.7–8.2)

## 2019-12-08 LAB — PROTEIN TOTAL: Protein:MCnc:Pt:Ser/Plas:Qn:: 5.1 — ABNORMAL LOW

## 2019-12-08 LAB — CBC
HEMATOCRIT: 25.2 % — ABNORMAL LOW (ref 36.0–46.0)
MEAN CORPUSCULAR HEMOGLOBIN CONC: 32.2 g/dL (ref 31.0–37.0)
MEAN CORPUSCULAR HEMOGLOBIN: 33.3 pg (ref 26.0–34.0)
MEAN CORPUSCULAR VOLUME: 103.3 fL — ABNORMAL HIGH (ref 80.0–100.0)
MEAN PLATELET VOLUME: 8.3 fL (ref 7.0–10.0)
PLATELET COUNT: 161 10*9/L (ref 150–440)
RED BLOOD CELL COUNT: 2.44 10*12/L — ABNORMAL LOW (ref 4.00–5.20)
RED CELL DISTRIBUTION WIDTH: 17.9 % — ABNORMAL HIGH (ref 12.0–15.0)
WBC ADJUSTED: 6.1 10*9/L (ref 4.5–11.0)

## 2019-12-08 LAB — PHOSPHORUS: Phosphate:MCnc:Pt:Ser/Plas:Qn:: 4.6

## 2019-12-08 LAB — MAGNESIUM: Magnesium:MCnc:Pt:Ser/Plas:Qn:: 1.6

## 2019-12-08 LAB — LIPASE: Triacylglycerol lipase:CCnc:Pt:Ser/Plas:Qn:: 236 — ABNORMAL HIGH

## 2019-12-08 LAB — RED BLOOD CELL COUNT: Lab: 2.44 — ABNORMAL LOW

## 2019-12-08 LAB — CO2: Carbon dioxide:SCnc:Pt:Ser/Plas:Qn:: 21

## 2019-12-08 MED ADMIN — dexmedeTOMIDine (PRECEDEX) injection: INTRAVENOUS | @ 20:00:00 | Stop: 2019-12-08

## 2019-12-08 MED ADMIN — midazolam (VERSED) injection: INTRAVENOUS | @ 20:00:00 | Stop: 2019-12-08

## 2019-12-08 MED ADMIN — ursodioL (ACTIGALL) capsule 300 mg: 300 mg | ORAL | @ 17:00:00

## 2019-12-08 MED ADMIN — piperacillin-tazobactam (ZOSYN) IVPB (premix) 3.375 g: 3.375 g | INTRAVENOUS | @ 17:00:00 | Stop: 2019-12-15

## 2019-12-08 MED ADMIN — propofoL (DIPRIVAN) injection: INTRAVENOUS | @ 20:00:00 | Stop: 2019-12-08

## 2019-12-08 MED ADMIN — predniSONE (DELTASONE) tablet 5 mg: 5 mg | ORAL | @ 12:00:00

## 2019-12-08 MED ADMIN — lactated Ringers infusion: INTRAVENOUS | @ 20:00:00 | Stop: 2019-12-08

## 2019-12-08 MED ADMIN — HYDROmorphone (PF) (DILAUDID) injection 0.5 mg: 0.5 mg | INTRAVENOUS | @ 22:00:00 | Stop: 2019-12-20

## 2019-12-08 MED ADMIN — albuterol 2.5 mg /3 mL (0.083 %) nebulizer solution 2.5 mg: 2.5 mg | RESPIRATORY_TRACT | @ 16:00:00

## 2019-12-08 MED ADMIN — tacrolimus (PROGRAF) capsule 4 mg: 4 mg | ORAL | @ 01:00:00

## 2019-12-08 MED ADMIN — mycophenolate (MYFORTIC) EC tablet 180 mg: 180 mg | ORAL | @ 01:00:00

## 2019-12-08 MED ADMIN — piperacillin-tazobactam (ZOSYN) IVPB (premix) 3.375 g: 3.375 g | INTRAVENOUS | @ 01:00:00 | Stop: 2019-12-14

## 2019-12-08 MED ADMIN — succinylcholine chloride (ANECTINE) injection: INTRAVENOUS | @ 20:00:00 | Stop: 2019-12-08

## 2019-12-08 MED ADMIN — valGANciclovir (VALCYTE) tablet 450 mg: 450 mg | ORAL | @ 12:00:00

## 2019-12-08 MED ADMIN — fentaNYL (PF) (SUBLIMAZE) injection: INTRAVENOUS | @ 20:00:00 | Stop: 2019-12-08

## 2019-12-08 MED ADMIN — ondansetron (ZOFRAN) injection: INTRAVENOUS | @ 20:00:00 | Stop: 2019-12-08

## 2019-12-08 MED ADMIN — aspirin chewable tablet 81 mg: 81 mg | ORAL | @ 12:00:00

## 2019-12-08 MED ADMIN — OLANZapine (ZYPREXA) tablet 5 mg: 5 mg | ORAL | @ 02:00:00

## 2019-12-08 MED ADMIN — ondansetron (ZOFRAN) injection 4 mg: 4 mg | INTRAVENOUS | @ 23:00:00

## 2019-12-08 MED ADMIN — dexamethasone (DECADRON) 4 mg/mL injection: INTRAVENOUS | @ 20:00:00 | Stop: 2019-12-08

## 2019-12-08 MED ADMIN — promethazine (PHENERGAN) injection 6.25 mg: 6.25 mg | INTRAVENOUS | @ 21:00:00 | Stop: 2019-12-08

## 2019-12-08 MED ADMIN — pentamidine (PENTAM) 300 mg in sterile water (PF) nebulizer solution: 300 mg | RESPIRATORY_TRACT | @ 16:00:00

## 2019-12-08 MED ADMIN — lactated Ringers infusion: 150 mL/h | INTRAVENOUS | @ 08:00:00

## 2019-12-08 MED ADMIN — lidocaine (XYLOCAINE) 20 mg/mL (2 %) injection: INTRAVENOUS | @ 20:00:00 | Stop: 2019-12-08

## 2019-12-08 NOTE — Unmapped (Signed)
Received care of patient this morning, A+OX4, pleasant and cooperative throughout shift. Vital signs have remained stable throughout shift. Incision site from liver transplant has healed, no drainage noted. Medications given as ordered. Patient ambulated in hall today, minimum assist. Patient is currently having an ERCP. Will continue to monitor patient's vital signs, pain level, I+Os, and incision sites.   Problem: Adult Inpatient Plan of Care  Goal: Plan of Care Review  Outcome: Progressing  Flowsheets (Taken 12/08/2019 1646)  Progress: improving  Plan of Care Reviewed With:   patient   family  Goal: Patient-Specific Goal (Individualized)  Outcome: Progressing  Goal: Absence of Hospital-Acquired Illness or Injury  Outcome: Progressing  Intervention: Identify and Manage Fall Risk  Flowsheets  Taken 12/08/2019 1646  Safety Interventions:   aspiration precautions   bed alarm   family at bedside   fall reduction program maintained   low bed  Taken 12/08/2019 1400  Safety Interventions:   bed alarm   low bed   fall reduction program maintained   family at bedside  Taken 12/08/2019 1200  Safety Interventions:   bed alarm   low bed   fall reduction program maintained  Taken 12/08/2019 1000  Safety Interventions:   bed alarm   low bed   fall reduction program maintained  Taken 12/08/2019 0800  Safety Interventions:   bed alarm   low bed   fall reduction program maintained   family at bedside  Intervention: Prevent Skin Injury  Flowsheets  Taken 12/08/2019 1646  Skin Protection: adhesive use limited  Taken 12/08/2019 1400  Skin Protection: adhesive use limited  Taken 12/08/2019 1200  Skin Protection: adhesive use limited  Taken 12/08/2019 1000  Skin Protection: adhesive use limited  Taken 12/08/2019 0800  Skin Protection: adhesive use limited  Intervention: Prevent and Manage VTE (Venous Thromboembolism) Risk  Flowsheets  Taken 12/08/2019 1646  Activity Management:   activity encouraged   ambulated outside room   ambulated in room  VTE Prevention/Management: ambulation promoted  Taken 12/08/2019 0800  Activity Management:   activity adjusted per tolerance   activity encouraged  Intervention: Prevent Infection  Flowsheets  Taken 12/08/2019 1646  Infection Prevention:   hand hygiene promoted   single patient room provided  Taken 12/08/2019 1400  Infection Prevention: hand hygiene promoted  Taken 12/08/2019 1200  Infection Prevention: hand hygiene promoted  Taken 12/08/2019 1000  Infection Prevention: hand hygiene promoted  Taken 12/08/2019 0800  Infection Prevention: hand hygiene promoted  Goal: Optimal Comfort and Wellbeing  Outcome: Progressing  Intervention: Monitor Pain and Promote Comfort  Flowsheets (Taken 12/08/2019 1646)  Pain Management Interventions: care clustered  Intervention: Provide Person-Centered Care  Flowsheets (Taken 12/08/2019 1646)  Trust Relationship/Rapport:   care explained   choices provided   questions encouraged   questions answered  Goal: Readiness for Transition of Care  Outcome: Progressing  Goal: Rounds/Family Conference  Outcome: Progressing  Flowsheets (Taken 12/08/2019 1339)  Participants:   nursing   case manager     Problem: Impaired Wound Healing  Goal: Optimal Wound Healing  Outcome: Progressing  Intervention: Promote Wound Healing  Flowsheets  Taken 12/08/2019 1646  Activity Management:   activity encouraged   ambulated outside room   ambulated in room  Pain Management Interventions: care clustered  Sleep/Rest Enhancement: awakenings minimized  Taken 12/08/2019 0800  Activity Management:   activity adjusted per tolerance   activity encouraged     Problem: Fall Injury Risk  Goal: Absence of Fall and Fall-Related Injury  Outcome:  Progressing  Intervention: Identify and Manage Contributors  Flowsheets (Taken 12/08/2019 1646)  Medication Review/Management: medications reviewed  Self-Care Promotion: independence encouraged  Intervention: Promote Injury-Free Environment  Flowsheets  Taken 12/08/2019 1646  Safety Interventions:   aspiration precautions   bed alarm   family at bedside   fall reduction program maintained   low bed  Taken 12/08/2019 1400  Safety Interventions:   bed alarm   low bed   fall reduction program maintained   family at bedside  Taken 12/08/2019 1200  Safety Interventions:   bed alarm   low bed   fall reduction program maintained  Taken 12/08/2019 1000  Safety Interventions:   bed alarm   low bed   fall reduction program maintained  Taken 12/08/2019 0800  Safety Interventions:   bed alarm   low bed   fall reduction program maintained   family at bedside     Problem: Diarrhea  Goal: Fluid and Electrolyte Balance  Outcome: Progressing  Intervention: Manage Diarrhea  Flowsheets  Taken 12/08/2019 1646  Medication Review/Management: medications reviewed  Taken 12/08/2019 1400  Isolation Precautions: protective precautions maintained  Taken 12/08/2019 1200  Isolation Precautions: protective precautions maintained  Taken 12/08/2019 1000  Isolation Precautions: protective precautions maintained  Taken 12/08/2019 0800  Isolation Precautions: protective precautions maintained

## 2019-12-08 NOTE — Unmapped (Signed)
Transplant Surgery Progress Note    Hospital Day: 2    Assessment:     Kathy Armstrong is a 49 y.o. 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      Plan:     Neuro:   - Multimodal pain control    CV:   - HDS    Pulm:  - Encourage IS    FEN/GI:  - F: IVF @ 150cc/hr  - E: Replete PRN  - N: NPO sips with meds  - GI: holding bowel regimen  - S/p ERCP, unsuccessful at crossing biliary anastomotic stricture    Renal/GU:   - Voiding spontaneously    Heme/ID:   - Zosyn for empiric coverage peri-ERCP  - Home immunosuppression medications restarted    Ppx:   - SCDs, holding SQH    Dispo:  - Stepdown status    Subjective/Interval Events:     Underoing ERCP today per GI. Rreports that she is feeling better this morning. Had one episode of vomiting in the ED prior to arriving in stepdown unit, but has not had any vomiting since.    Objective:      Vital Signs:  BP 177/86  - Pulse 77  - Temp 36.8 ??C (Oral)  - Resp 20  - Ht 157.5 cm (5' 2)  - Wt 57.6 kg (127 lb)  - SpO2 97%  - BMI 23.23 kg/m??     Physical Exam:    General: uncomfortable-appearing, but in no acute distress  Neuro: alert and oriented, conversational and appropriate  CV: regular rate  Pulm: unlabored breathing on room air  Abd: soft, moderately tender to palpation LUQ > epigastric region, nondistended. Mercedes incision well-healed.

## 2019-12-08 NOTE — Unmapped (Signed)
Transplant Surgery Progress Note    Hospital Day: 3    Assessment:     Camala Petion is a 49 y.o. 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      Plan:     Neuro:   - Multimodal pain control    CV:   - HDS    Pulm:  - Encourage IS    FEN/GI:  - F: IVF @ 150cc/hr  - E: Replete PRN  - N: NPO sips with meds  - GI: Holding bowel regimen  - S/p ERCP, unsuccessful at crossing biliary anastomotic stricture  - Possible repeat ERCP today    Renal/GU:   - Voiding spontaneously    Heme/ID:   - Zosyn for empiric coverage peri-ERCP  - Home immunosuppression medications restarted    Ppx:   - SCDs, holding SQH    Dispo:  - Stepdown status    Subjective/Interval Events:     Feels better this morning. Reports feeling hungry. Lipase 236, downtrending. Possible ERCP later today.    Objective:      Vital Signs:  BP 153/83  - Pulse 76  - Temp 37.3 ??C (Temporal)  - Resp 22  - Ht 157.5 cm (5' 2)  - Wt 57.6 kg  - SpO2 100%  - BMI 23.23 kg/m??     Physical Exam:    General: uncomfortable-appearing, but in no acute distress  Neuro: alert and oriented, conversational and appropriate  CV: regular rate  Pulm: unlabored breathing on room air  Abd: soft, mildly tender to palpation LUQ > epigastric region, nondistended. Mercedes incision well-healed.

## 2019-12-09 ENCOUNTER — Other Ambulatory Visit: Payer: Self-pay | Admitting: *Deleted

## 2019-12-09 LAB — HEPATIC FUNCTION PANEL
AST (SGOT): 24 U/L (ref <=34)
BILIRUBIN DIRECT: 0.7 mg/dL — ABNORMAL HIGH (ref 0.00–0.30)
BILIRUBIN TOTAL: 1 mg/dL (ref 0.3–1.2)
PROTEIN TOTAL: 4.9 g/dL — ABNORMAL LOW (ref 5.7–8.2)

## 2019-12-09 LAB — BASIC METABOLIC PANEL
ANION GAP: 12 mmol/L (ref 5–14)
BLOOD UREA NITROGEN: 27 mg/dL — ABNORMAL HIGH (ref 9–23)
BUN / CREAT RATIO: 21
CALCIUM: 7.1 mg/dL — ABNORMAL LOW (ref 8.7–10.4)
CHLORIDE: 107 mmol/L (ref 98–107)
CO2: 18 mmol/L — ABNORMAL LOW (ref 20.0–31.0)
CREATININE: 1.3 mg/dL — ABNORMAL HIGH
EGFR CKD-EPI NON-AA FEMALE: 48 mL/min/{1.73_m2} — ABNORMAL LOW (ref >=60)
GLUCOSE RANDOM: 70 mg/dL (ref 70–179)
SODIUM: 137 mmol/L (ref 135–145)

## 2019-12-09 LAB — CBC
HEMATOCRIT: 26.1 % — ABNORMAL LOW (ref 36.0–46.0)
HEMOGLOBIN: 8.4 g/dL — ABNORMAL LOW (ref 12.0–16.0)
MEAN CORPUSCULAR HEMOGLOBIN CONC: 32.1 g/dL (ref 31.0–37.0)
MEAN CORPUSCULAR HEMOGLOBIN: 33.8 pg (ref 26.0–34.0)
MEAN CORPUSCULAR VOLUME: 105.1 fL — ABNORMAL HIGH (ref 80.0–100.0)
MEAN PLATELET VOLUME: 7.9 fL (ref 7.0–10.0)
RED BLOOD CELL COUNT: 2.48 10*12/L — ABNORMAL LOW (ref 4.00–5.20)
WBC ADJUSTED: 5.4 10*9/L (ref 4.5–11.0)

## 2019-12-09 LAB — TACROLIMUS, TROUGH
Lab: 13.9
Lab: 19.3 — ABNORMAL HIGH

## 2019-12-09 LAB — LIPASE: Triacylglycerol lipase:CCnc:Pt:Ser/Plas:Qn:: 129 — ABNORMAL HIGH

## 2019-12-09 LAB — AST (SGOT): Aspartate aminotransferase:CCnc:Pt:Ser/Plas:Qn:: 24

## 2019-12-09 LAB — MAGNESIUM: Magnesium:MCnc:Pt:Ser/Plas:Qn:: 1.4 — ABNORMAL LOW

## 2019-12-09 LAB — EGFR CKD-EPI NON-AA FEMALE
Glomerular filtration rate/1.73 sq M.predicted.non black:ArVRat:Pt:Ser/Plas/Bld:Qn:Creatinine-based formula (CKD-EPI): 48 — ABNORMAL LOW

## 2019-12-09 LAB — MEAN CORPUSCULAR VOLUME: Erythrocyte mean corpuscular volume:EntVol:Pt:RBC:Qn:Automated count: 105.1 — ABNORMAL HIGH

## 2019-12-09 LAB — PHOSPHORUS: Phosphate:MCnc:Pt:Ser/Plas:Qn:: 4.1

## 2019-12-09 MED ORDER — ONDANSETRON 8 MG DISINTEGRATING TABLET
ORAL_TABLET | Freq: Three times a day (TID) | ORAL | 0 refills | 10 days | Status: CP | PRN
Start: 2019-12-09 — End: ?
  Filled 2019-12-10: qty 30, 10d supply, fill #0

## 2019-12-09 MED ORDER — CIPROFLOXACIN 500 MG TABLET
ORAL_TABLET | Freq: Two times a day (BID) | ORAL | 0 refills | 7.00000 days | Status: CP
Start: 2019-12-09 — End: 2019-12-16
  Filled 2019-12-10: qty 14, 7d supply, fill #0

## 2019-12-09 MED ORDER — MG-PLUS-PROTEIN 133 MG TABLET
ORAL_TABLET | Freq: Two times a day (BID) | ORAL | 11 refills | 50 days
Start: 2019-12-09 — End: ?

## 2019-12-09 MED ADMIN — piperacillin-tazobactam (ZOSYN) IVPB (premix) 3.375 g: 3.375 g | INTRAVENOUS | @ 18:00:00 | Stop: 2019-12-15

## 2019-12-09 MED ADMIN — ursodioL (ACTIGALL) capsule 300 mg: 300 mg | ORAL | @ 01:00:00

## 2019-12-09 MED ADMIN — HYDROmorphone (PF) (DILAUDID) injection 0.5 mg: 0.5 mg | INTRAVENOUS | @ 08:00:00 | Stop: 2019-12-20

## 2019-12-09 MED ADMIN — promethazine (PHENERGAN) 12.5 mg in sodium chloride (NS) 0.9 % 25 mL infusion: 12.5 mg | INTRAVENOUS | @ 15:00:00

## 2019-12-09 MED ADMIN — predniSONE (DELTASONE) tablet 5 mg: 5 mg | ORAL | @ 13:00:00

## 2019-12-09 MED ADMIN — HYDROmorphone (PF) (DILAUDID) injection 0.5 mg: 0.5 mg | INTRAVENOUS | @ 18:00:00 | Stop: 2019-12-20

## 2019-12-09 MED ADMIN — promethazine (PHENERGAN) 12.5 mg in sodium chloride (NS) 0.9 % 25 mL infusion: 12.5 mg | INTRAVENOUS | @ 08:00:00

## 2019-12-09 MED ADMIN — carvediloL (COREG) tablet 25 mg: 25 mg | ORAL | @ 13:00:00

## 2019-12-09 MED ADMIN — promethazine (PHENERGAN) 12.5 mg in sodium chloride (NS) 0.9 % 25 mL infusion: 12.5 mg | INTRAVENOUS | @ 22:00:00

## 2019-12-09 MED ADMIN — mycophenolate (MYFORTIC) EC tablet 180 mg: 180 mg | ORAL | @ 01:00:00

## 2019-12-09 MED ADMIN — pantoprazole (PROTONIX) EC tablet 20 mg: 20 mg | ORAL | @ 13:00:00

## 2019-12-09 MED ADMIN — folic acid (FOLVITE) tablet 1 mg: 1 mg | ORAL | @ 13:00:00

## 2019-12-09 MED ADMIN — ursodioL (ACTIGALL) capsule 300 mg: 300 mg | ORAL | @ 13:00:00

## 2019-12-09 MED ADMIN — magnesium oxide (MAG-OX) tablet 400 mg: 400 mg | ORAL | @ 13:00:00 | Stop: 2019-12-09

## 2019-12-09 MED ADMIN — magnesium sulfate 2gm/50mL IVPB: 2 g | INTRAVENOUS | @ 15:00:00 | Stop: 2019-12-09

## 2019-12-09 MED ADMIN — albumin human 5 % bottle 25 g: 25 g | INTRAVENOUS | @ 13:00:00 | Stop: 2019-12-09

## 2019-12-09 MED ADMIN — tacrolimus (PROGRAF) capsule 4 mg: 4 mg | ORAL | @ 01:00:00

## 2019-12-09 MED ADMIN — ondansetron (ZOFRAN) injection 4 mg: 4 mg | INTRAVENOUS | @ 13:00:00

## 2019-12-09 MED ADMIN — lidocaine (LIDODERM) 5 % patch 1 patch: 1 | TRANSDERMAL | @ 13:00:00

## 2019-12-09 MED ADMIN — mycophenolate (MYFORTIC) EC tablet 180 mg: 180 mg | ORAL | @ 13:00:00

## 2019-12-09 NOTE — Unmapped (Signed)
Tacrolimus Therapeutic Monitoring Pharmacy Note    Janautica Wesche is a 49 y.o. female continuing tacrolimus.     Indication: Liver transplant     Date of Transplant: 10/18/19      Prior Dosing Information: Home regimen Tacrolimus 4 mg PO BID     Goals:  Therapeutic Drug Levels  Tacrolimus trough goal: 8-10 ng/mL    Additional Clinical Monitoring/Outcomes  ?? Monitor renal function (SCr and urine output) and liver function (LFTs)  ?? Monitor for signs/symptoms of adverse events (e.g., hyperglycemia, hyperkalemia, hypomagnesemia, hypertension, headache, tremor)    Results:   Tacrolimus level: 19.3 ng/mL, drawn appropriately    Pharmacokinetic Considerations and Significant Drug Interactions:  ??? Concurrent hepatotoxic medications: None identified  ??? Concurrent CYP3A4 substrates/inhibitors: None identified  ??? Concurrent nephrotoxic medications: None identified    Assessment/Plan:  Recommendedation(s)  ??? Hold tac until level results tomorrow. Likely elevated level is due to diarrhea    Follow-up  ??? Daily tac levels.   ??? A pharmacist will continue to monitor and recommend levels as appropriate    Please page service pharmacist with questions/clarifications.    Vertis Kelch, PharmD

## 2019-12-09 NOTE — Unmapped (Addendum)
Patient alert and oriented x4. Afebrile. NSR. Hypertensive but within parameters. Patient on room air. Pain managed with PRN Dilaudid x2. Nausea managed with PRN Zofran x1 and Phenergan x2. Pt voiding; adequate UOP. No BM. Pt turns self in bed; No new skin breakdown noted. Will continue to monitor patient's vital signs, pain level, I+Os    Problem: Adult Inpatient Plan of Care  Goal: Plan of Care Review  Outcome: Ongoing - Unchanged  Goal: Patient-Specific Goal (Individualized)  Outcome: Ongoing - Unchanged  Goal: Absence of Hospital-Acquired Illness or Injury  Outcome: Ongoing - Unchanged  Intervention: Identify and Manage Fall Risk  Recent Flowsheet Documentation  Taken 12/08/2019 2000 by Alexander Mt, RN  Safety Interventions:  ??? aspiration precautions  ??? fall reduction program maintained  ??? low bed  Intervention: Prevent and Manage VTE (Venous Thromboembolism) Risk  Recent Flowsheet Documentation  Taken 12/09/2019 0600 by Alexander Mt, RN  Activity Management: activity adjusted per tolerance  Taken 12/09/2019 0400 by Alexander Mt, RN  Activity Management: activity adjusted per tolerance  Taken 12/09/2019 0200 by Alexander Mt, RN  Activity Management: activity adjusted per tolerance  Taken 12/09/2019 0000 by Alexander Mt, RN  Activity Management: activity adjusted per tolerance  Taken 12/08/2019 2200 by Alexander Mt, RN  Activity Management: activity adjusted per tolerance  Taken 12/08/2019 2000 by Alexander Mt, RN  Activity Management: activity adjusted per tolerance  Intervention: Prevent Infection  Recent Flowsheet Documentation  Taken 12/08/2019 2000 by Alexander Mt, RN  Infection Prevention: hand hygiene promoted  Goal: Optimal Comfort and Wellbeing  Outcome: Ongoing - Unchanged  Goal: Readiness for Transition of Care  Outcome: Ongoing - Unchanged  Goal: Rounds/Family Conference  Outcome: Ongoing - Unchanged

## 2019-12-09 NOTE — Patient Outreach (Signed)
[place] [facility]) Care Management  12/09/2019  RIONNA FELTES 05-01-1970 [id number]  Insurance  Coordination   12/08/19 Monthly telephonic UMR case management review with UMR RNCMs and Dr Alonza Bogus. Update provided to include:Patient transition of care outreach updates with progress, was participating in outpatient physical therapy. Care coordination call with Transplant coordinator. Discussed readmission 10/5-10/8 with Dx:UTI, attempted transition of care call on 10/11 patient in clinic appointment Noted patient readmission on 10/12 for abdominal pain .   Plan Will continue transition of care outreaches pending discharge disposition.   [name], RN, BSN  [place] Management Coordinator  [phone]- Mobile [phone]- Toll Free Main Office

## 2019-12-10 DIAGNOSIS — Z5181 Encounter for therapeutic drug level monitoring: Secondary | ICD-10-CM | POA: Diagnosis not present

## 2019-12-10 DIAGNOSIS — Z944 Liver transplant status: Secondary | ICD-10-CM | POA: Diagnosis not present

## 2019-12-10 DIAGNOSIS — Z79899 Other long term (current) drug therapy: Secondary | ICD-10-CM | POA: Diagnosis not present

## 2019-12-10 LAB — BASIC METABOLIC PANEL
ANION GAP: 10 mmol/L (ref 5–14)
BLOOD UREA NITROGEN: 21 mg/dL (ref 9–23)
BUN / CREAT RATIO: 16
CALCIUM: 8.3 mg/dL — ABNORMAL LOW (ref 8.7–10.4)
CHLORIDE: 105 mmol/L (ref 98–107)
CREATININE: 1.33 mg/dL — ABNORMAL HIGH
EGFR CKD-EPI AA FEMALE: 54 mL/min/{1.73_m2} — ABNORMAL LOW (ref >=60)
EGFR CKD-EPI NON-AA FEMALE: 47 mL/min/{1.73_m2} — ABNORMAL LOW (ref >=60)
GLUCOSE RANDOM: 71 mg/dL (ref 70–179)
POTASSIUM: 3.7 mmol/L (ref 3.4–4.5)
SODIUM: 137 mmol/L (ref 135–145)

## 2019-12-10 LAB — CBC
HEMATOCRIT: 22.4 % — ABNORMAL LOW (ref 36.0–46.0)
HEMOGLOBIN: 7.7 g/dL — ABNORMAL LOW (ref 12.0–16.0)
MEAN CORPUSCULAR HEMOGLOBIN CONC: 34.3 g/dL (ref 31.0–37.0)
MEAN CORPUSCULAR HEMOGLOBIN: 35.1 pg — ABNORMAL HIGH (ref 26.0–34.0)
MEAN CORPUSCULAR VOLUME: 102.4 fL — ABNORMAL HIGH (ref 80.0–100.0)
MEAN PLATELET VOLUME: 9 fL (ref 7.0–10.0)
PLATELET COUNT: 130 10*9/L — ABNORMAL LOW (ref 150–440)
RED CELL DISTRIBUTION WIDTH: 17.3 % — ABNORMAL HIGH (ref 12.0–15.0)
WBC ADJUSTED: 4.4 10*9/L — ABNORMAL LOW (ref 4.5–11.0)

## 2019-12-10 LAB — HEPATIC FUNCTION PANEL
ALBUMIN: 2.9 g/dL — ABNORMAL LOW (ref 3.4–5.0)
ALKALINE PHOSPHATASE: 323 U/L — ABNORMAL HIGH (ref 46–116)
AST (SGOT): 14 U/L (ref <=34)
BILIRUBIN DIRECT: 0.6 mg/dL — ABNORMAL HIGH (ref 0.00–0.30)
PROTEIN TOTAL: 5.4 g/dL — ABNORMAL LOW (ref 5.7–8.2)

## 2019-12-10 LAB — PHOSPHORUS
PHOSPHORUS: 2.7 mg/dL (ref 2.4–5.1)
Phosphate:MCnc:Pt:Ser/Plas:Qn:: 2.7

## 2019-12-10 LAB — TACROLIMUS, TROUGH: Lab: 6.4

## 2019-12-10 LAB — MAGNESIUM: Magnesium:MCnc:Pt:Ser/Plas:Qn:: 2

## 2019-12-10 LAB — CO2: Carbon dioxide:SCnc:Pt:Ser/Plas:Qn:: 22

## 2019-12-10 LAB — MEAN CORPUSCULAR VOLUME: Erythrocyte mean corpuscular volume:EntVol:Pt:RBC:Qn:Automated count: 102.4 — ABNORMAL HIGH

## 2019-12-10 LAB — AST (SGOT): Aspartate aminotransferase:CCnc:Pt:Ser/Plas:Qn:: 14

## 2019-12-10 MED ADMIN — piperacillin-tazobactam (ZOSYN) IVPB (premix) 3.375 g: 3.375 g | INTRAVENOUS | @ 01:00:00 | Stop: 2019-12-15

## 2019-12-10 MED ADMIN — carvediloL (COREG) tablet 25 mg: 25 mg | ORAL | @ 01:00:00

## 2019-12-10 MED ADMIN — carvediloL (COREG) tablet 25 mg: 25 mg | ORAL | @ 14:00:00 | Stop: 2019-12-10

## 2019-12-10 MED ADMIN — ursodioL (ACTIGALL) capsule 300 mg: 300 mg | ORAL | @ 14:00:00 | Stop: 2019-12-10

## 2019-12-10 MED ADMIN — heparin, porcine (PF) 100 unit/mL injection 500 Units: 500 [IU] | INTRAVENOUS | @ 19:00:00 | Stop: 2019-12-10

## 2019-12-10 MED ADMIN — mycophenolate (MYFORTIC) EC tablet 180 mg: 180 mg | ORAL | @ 15:00:00 | Stop: 2019-12-10

## 2019-12-10 MED ADMIN — valGANciclovir (VALCYTE) tablet 450 mg: 450 mg | ORAL | @ 15:00:00 | Stop: 2019-12-10

## 2019-12-10 MED ADMIN — ursodioL (ACTIGALL) capsule 300 mg: 300 mg | ORAL | @ 19:00:00 | Stop: 2019-12-10

## 2019-12-10 MED ADMIN — calcium carbonate (TUMS) chewable tablet 200 mg of elem calcium: 200 mg | ORAL | @ 12:00:00 | Stop: 2019-12-10

## 2019-12-10 MED ADMIN — mycophenolate (MYFORTIC) EC tablet 180 mg: 180 mg | ORAL | @ 01:00:00

## 2019-12-10 MED ADMIN — ursodioL (ACTIGALL) capsule 300 mg: 300 mg | ORAL | @ 02:00:00

## 2019-12-10 MED FILL — ONDANSETRON 8 MG DISINTEGRATING TABLET: 10 days supply | Qty: 30 | Fill #0 | Status: AC

## 2019-12-10 MED FILL — CIPROFLOXACIN 500 MG TABLET: 7 days supply | Qty: 14 | Fill #0 | Status: AC

## 2019-12-10 NOTE — Unmapped (Signed)
Shift Note: Patient remained stable overnight on ISCU. Vital signs stable on room air. Pain intermittent throughout the night and controlled with PRN pain medications. Alert and oriented x4. Complained of nausea overnight so PRN anti-emetics administered which helped slightly. Nursing will continue to monitor patient.     Problem: Adult Inpatient Plan of Care  Goal: Plan of Care Review  Outcome: Progressing  Goal: Patient-Specific Goal (Individualized)  Outcome: Progressing  Goal: Absence of Hospital-Acquired Illness or Injury  Outcome: Progressing  Intervention: Identify and Manage Fall Risk  Recent Flowsheet Documentation  Taken 12/09/2019 2000 by Raynald Kemp, RN  Safety Interventions:  ??? bed alarm  ??? fall reduction program maintained  ??? family at bedside  ??? lighting adjusted for tasks/safety  ??? low bed  Intervention: Prevent Skin Injury  Recent Flowsheet Documentation  Taken 12/10/2019 0000 by Raynald Kemp, RN  Skin Protection:  ??? adhesive use limited  ??? incontinence pads utilized  ??? silicone foam dressing in place  Taken 12/09/2019 2200 by Raynald Kemp, RN  Skin Protection:  ??? adhesive use limited  ??? incontinence pads utilized  Taken 12/09/2019 2000 by Raynald Kemp, RN  Skin Protection:  ??? adhesive use limited  ??? incontinence pads utilized  Intervention: Prevent and Manage VTE (Venous Thromboembolism) Risk  Recent Flowsheet Documentation  Taken 12/10/2019 0000 by Raynald Kemp, RN  Activity Management: activity adjusted per tolerance  Taken 12/09/2019 2200 by Raynald Kemp, RN  Activity Management: activity adjusted per tolerance  Taken 12/09/2019 2000 by Raynald Kemp, RN  Activity Management: activity adjusted per tolerance  Intervention: Prevent Infection  Recent Flowsheet Documentation  Taken 12/09/2019 2000 by Raynald Kemp, RN  Infection Prevention:  ??? rest/sleep promoted  ??? personal protective equipment utilized  ??? hand hygiene promoted  ??? equipment surfaces disinfected  ??? single patient room provided  Goal: Optimal Comfort and Wellbeing  Outcome: Progressing  Note: Patient remained stable overnight on ISCU.   Goal: Readiness for Transition of Care  Outcome: Progressing  Goal: Rounds/Family Conference  Outcome: Progressing     Problem: Impaired Wound Healing  Goal: Optimal Wound Healing  Outcome: Progressing  Note: Wounds clean, dry, and intact.   Intervention: Promote Wound Healing  Recent Flowsheet Documentation  Taken 12/10/2019 0000 by Raynald Kemp, RN  Activity Management: activity adjusted per tolerance  Taken 12/09/2019 2200 by Raynald Kemp, RN  Activity Management: activity adjusted per tolerance  Taken 12/09/2019 2000 by Raynald Kemp, RN  Activity Management: activity adjusted per tolerance     Problem: Fall Injury Risk  Goal: Absence of Fall and Fall-Related Injury  Outcome: Progressing  Note: Patient remained free from falls overnight on ISCU.   Intervention: Promote Injury-Free Environment  Recent Flowsheet Documentation  Taken 12/09/2019 2000 by Raynald Kemp, RN  Safety Interventions:  ??? bed alarm  ??? fall reduction program maintained  ??? family at bedside  ??? lighting adjusted for tasks/safety  ??? low bed     Problem: Diarrhea  Goal: Fluid and Electrolyte Balance  Outcome: Progressing  Intervention: Manage Diarrhea  Recent Flowsheet Documentation  Taken 12/09/2019 2000 by Raynald Kemp, RN  Isolation Precautions: protective precautions maintained

## 2019-12-10 NOTE — Unmapped (Signed)
Discharge Summary    Admit date: 12/06/2019    Discharge date and time: 12/10/2019    Discharge to:  Home    Discharge Service: Surg Transplant [facility])    Discharge Attending Physician: Lilyan Punt [facility]*    Discharge  Diagnoses: biliary stricture    Secondary Diagnosis: Active Problems:    * No active hospital problems. *  Resolved Problems:    * No resolved hospital problems. *      OR Procedures:    ERCP;WITH TRANS-ENDOSCOPIC BALLOON DILATION OF BILIARY/PANCREATIC DUCT(S) OR OF AMPULLA, INCLUDING SPHINCTERECTOMY, WHEN PERFOREMD,EACH DUCT ([address])  Date  12/06/2019  -------------------    ENDOSCOPIC RETROGRADE CHOLANGIOPANCREATOGRAPHY (ERCP); W/ REMOVAL OF FOREIGN BODY/STENT FROM BILIARY/PANCREATIC DUCT(S)  ERCP; W/SPHINCTEROTOMY/PAPILLOTOMY  ENDOSCOPIC CATHETERIZATION OF THE BILIARY DUCTAL SYSTEM, RADIOLOGICAL SUPERVISION AND INTERPRETATION  Date  12/07/2019  -------------------  **Canceled**    ERCP; DIAGNOSTIC, WITH OR WITHOUT COLLECTION OF SPECIMEN(S) BY BRUSHING OR WASHING  Date  12/08/2019  -------------------    ENDOSCOPIC RETROGRADE CHOLANGIOPANCREATOGRAPHY (ERCP); WITH PLACEMENT OF ENDOSCOPIC STENT INTO BILIARY OR PANCREATIC DUCT  ENDOSCOPIC CATHETERIZATION OF THE BILIARY DUCTAL SYSTEM, RADIOLOGICAL SUPERVISION AND INTERPRETATION  Date  12/08/2019  -------------------     Ancillary Procedures: no procedures    Discharge Day Services:     Subjective   No acute events overnight.    Objective   Patient Vitals for the past 8 hrs:   BP Temp Temp src Pulse SpO2 Pulse Resp SpO2   12/10/19 1028 175/84 ??? ??? 77 ??? ??? ???   12/10/19 0730 156/66 37.1 ??C Oral 80 80 19 96 %   12/10/19 0622 ??? ??? ??? 74 74 21 98 %   12/10/19 0522 ??? ??? ??? 71 71 12 99 %     I/O this shift:  In: 250 [P.O.:200; IV Piggyback:50]  Out: 2 [Stool:2]    Abdomen: soft, non-tender, non-distended, no organomegaly, no hernia or masses palpated.    Hospital Course: Chrystie Hagwood is a 49 y.o. 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 Patient reports that she woke up from the pain at around 2 AM this morning. Associated with multiple episodes of nausea and vomiting. Has not been able to tolerate any PO intake. Denied any recent fevers or chills. Having regular BMs. She was found to have pancreatitis and biliary stricture. ERCP done 10/14 showed biliary stricture. She had a stent placed.     Her clinical status improved. By discharge, she was tolerating a regular diet and abdominal pain had resolved. She will follow-up with transplant clinic in 2 weeks.    Condition at Discharge: Improved  Discharge Medications:      Medication List      START taking these medications    ??? ciprofloxacin HCl 500 MG tablet; Commonly known as: CIPRO; Take 1 tablet   (500 mg total) by mouth Two (2) times a day for 7 days.     CHANGE how you take these medications    ??? magnesium (amino acid chelate) 133 mg Tab; Generic drug: magnesium   oxide-Mg AA chelate; Take 1 tablet by mouth Two (2) times a day.; What   changed: additional instructions     CONTINUE taking these medications    ??? * acetaminophen 500 MG tablet; Commonly known as: TYLENOL  ??? * acetaminophen  325 MG tablet; Commonly known as: TYLENOL; Take 2   tablets (650 mg total) by mouth every six (6) hours as needed for pain or   fever (> 100.4 F).  ??? aspirin 81 MG tablet; Commonly known as: ASPIR-81; Take 1 tablet (81 mg   total) by mouth daily.  ??? carvediloL 25 MG tablet; Commonly known as: COREG; Take 1 tablet (25 mg   total) by mouth Two (2) times a day.  ??? DEKAS PLUS (FOLIC ACID) 200 mcg-1,000 mcg-10 mg Cap; Generic drug:   multivit with min #[id number]  ??? ergocalciferol-1,250 mcg (50,000 unit) 1,250 mcg (50,000 unit) capsule;   Commonly known as: DRISDOL; Take 1 capsule (1,250 mcg total) by mouth once   a week.  ??? fluticasone propionate 50 mcg/actuation nasal spray; Commonly known as:   FLONASE; Use 1 spray into each nostril daily as needed for rhinitis.  ??? folic acid 1 MG tablet; Commonly known as: FOLVITE; Take 1 tablet (1 mg   total) by mouth daily.  ??? loperamide 2 mg capsule; Commonly known as: IMODIUM; Take 2 capsules (4   mg total) by mouth Three (3) times a day as needed for diarrhea.  ??? loratadine 10 mg tablet; Commonly known as: CLARITIN  ??? melatonin 3 mg Tab; Take 1 tablet (3 mg total) by mouth every evening.  ??? mycophenolate 180 MG EC tablet; Commonly known as: MYFORTIC; Take 1   tablet (180 mg total) by mouth Two (2) times a day.  ??? NON FORMULARY  ??? OLANZapine 5 MG tablet; Commonly known as: ZYPREXA; Take 1 tablet (5 mg   total) by mouth nightly.  ??? omeprazole 40 MG capsule; Commonly known as: PriLOSEC; Take 1 capsule   (40 mg total) by mouth daily.  ??? ondansetron 8 MG disintegrating tablet; Commonly known as: ZOFRAN-ODT;   Dissolve 1 tablet (8 mg total) by mouth every eight (8) hours as needed.  ??? pentamidine in sterile water (PF); Inhale 6 mL (300 mg total) every   twenty-eight (28) days.  ??? PRALUENT PEN 150 mg/mL subcutaneous injection; Generic drug: alirocumab  ??? predniSONE 5 MG tablet; Commonly known as: DELTASONE; Take 1 tablet (5   mg total) by mouth daily.  ??? tacrolimus 1 MG capsule; Commonly known as: PROGRAF; Take 4 capsules (4   mg total) by mouth two (2) times a day.  ??? ursodioL 300 mg capsule; Commonly known as: ACTIGALL; Take 1 capsule   (300 mg total) by mouth Three (3) times a day.  ??? valGANciclovir 450 mg tablet; Commonly known as: VALCYTE; Take 1 tablet   (450 mg total) by mouth daily.  * This list has 2 medication(s) that are the same as other medications   prescribed for you. Read the directions carefully, and ask your doctor or   other care provider to review them with you.       Pending Test Results:     Discharge Instructions:  Activity:     Diet:    Other Instructions:  Other Instructions     Discharge instructions Activity: Do not lift > 10-15 lbs for 1st 6 weeks, then gradually increase to 25 lbs over the following 6 weeks. Resume heavy lifting only after being cleared to do so at follow-up appointment in Transplant Surgery clinic.    Diet: Regular    Contact your transplant coordinator or the Transplant Surgery Office [phone]) during business hours or page the transplant coordinator on call [phone]) after business hours for:    -  fever >100.5 degrees F by mouth, any fever with shaking chills, or other signs or symptoms of infection   - uncontrolled nausea, vomiting, or diarrhea; inability to have a bowel movement for > 3 days.   - any problem that prevents taking medications as scheduled.   - pain uncontrolled with prescribed medication or new pain or tenderness at the surgical site   - sudden weight gain or increase in blood pressure (greater than 140/85)   - shortness of breath, chest pain / discomfort   - new or increasing jaundice   - urinary symptoms including pain / difficulty / burning or tea-colored urine   - any other new or concerning symptoms   - questions regarding your medications or continuing care         Activity: Do not lift > 10-15 lbs for 1st 6 weeks, then gradually increase to 25 lbs over the following 6 weeks. Resume heavy lifting only after being cleared to do so at follow-up appointment in Transplant Surgery clinic.    Diet: Regular    Contact your transplant coordinator or the Transplant Surgery Office [phone]) during business hours or page the transplant coordinator on call [phone]) after business hours for:    - fever >100.5 degrees F by mouth, any fever with shaking chills, or other signs or symptoms of infection   - uncontrolled nausea, vomiting, or diarrhea; inability to have a bowel movement for > 3 days.   - any problem that prevents taking medications as scheduled.   - pain uncontrolled with prescribed medication or new pain or tenderness at the surgical site   - sudden weight gain or increase in blood pressure (greater than 140/85)   - shortness of breath, chest pain / discomfort   - new or increasing jaundice   - urinary symptoms including pain / difficulty / burning or tea-colored urine   - any other new or concerning symptoms   - questions regarding your medications or continuing care        Labs and Other Follow-ups after Discharge:  Follow Up instructions and Outpatient Referrals     Discharge instructions      Activity: Do not lift > 10-15 lbs for 1st 6 weeks, then gradually increase to 25 lbs over the following 6 weeks. Resume heavy lifting only after being cleared to do so at follow-up appointment in Transplant Surgery clinic.    Diet: Regular    Contact your transplant coordinator or the Transplant Surgery Office [phone]) during business hours or page the transplant coordinator on call [phone]) after business hours for:    - fever >100.5 degrees F by mouth, any fever with shaking chills, or other signs or symptoms of infection   - uncontrolled nausea, vomiting, or diarrhea; inability to have a bowel movement for > 3 days.   - any problem that prevents taking medications as scheduled.   - pain uncontrolled with prescribed medication or new pain or tenderness at the surgical site   - sudden weight gain or increase in blood pressure (greater than 140/85)   - shortness of breath, chest pain / discomfort   - new or increasing jaundice   - urinary symptoms including pain / difficulty / burning or tea-colored urine   - any other new or concerning symptoms   - questions regarding your medications or continuing care          Future Appointments:  Appointments which have been scheduled for you    Jan 13, 2020  9:00 AM  (Arrive by 8:30 AM)  RETURN VIDEO HCP DIRECT LINK with Bjorn Pippin  [facility] LIVER TRANSPLANT [place] [facility] REGION) [address]  [place] [address]  [phone]      Jan 13, 2020  1:00 PM  (Arrive by 12:30 PM)  RETURN VIDEO HCP DIRECT LINK with Lanelle Bal, RD/LDN  [facility] NUTRITION SERVICES TRANSPLANT [place] [facility] REGION) [address]  [place] [address]  [phone]      Jan 16, 2020  8:30 AM  (Arrive by 8:00 AM)  LAB ONLY with LAB PHLEB GRND UNCW  LAB PHLEB GRND FLR [employer] [facility] REGION) [address]  [place] [address]  [phone]      Jan 16, 2020  9:00 AM  (Arrive by 8:30 AM)  XR ABDOMEN 1 VIEW with Toney Reil RM 11  IMG DIAG X-RAY [facility]) [address]  [place] [address]  [phone]      Jan 16, 2020  9:40 AM  (Arrive by 9:10 AM)  RETURN PHARMD with Jordan Likes, CPP  [facility] TRANSPLANT SURGERY [facility] REGION) [address]  [place] [address]  [phone]      Jan 16, 2020 11:00 AM  (Arrive by 10:30 AM)  RETURN  30 with Bertram Denver, FNP  [facility] TRANSPLANT SURGERY [facility] REGION) [address]  [place] [address]  [phone]      Jan 24, 2020 10:30 AM  (Arrive by 10:20 AM)  RETURN VIDEO - EPIC AMWELL with Park Pope, MD  [facility] PSYCHIATRY Transplant AT [facility] REGION) [address]  [address]  [place] [address]  [phone]   Please sign into My [place] Chart at least 15 minutes before your appointment to complete any unfinished steps in the Get Started process. Get Started is required to be complete prior to your video visit.     Please visit [email or web address] for information about our safe promise as you receive care at [facility].    My [place] chart allows you to manage your health, send messages to your provider, view your test results, schedule and manage appointments, and request prescription refills securely and conveniently from your computer or mobile device.    You can go to [email or web address] to Sign in to your My [place] Chart account with your username and password. If you have forgotten them, please choose the Forgot Username? and/or Forgot Password? links to gain access. You can also access your My [place] Chart account with the free MyChart mobile app for Android or iPhone.    If you need further assistance with accessing your My [place] Chart account or for assistance in reaching your provider's office to reschedule or cancel your appointment  call [facility] at [phone].         Apr 16, 2020  9:00 AM  (Arrive by 8:30 AM)  RETURN PHARMD with Jordan Likes, CPP  [facility] TRANSPLANT SURGERY [facility] REGION) [address]  [place] [address]  [phone]      Apr 16, 2020 10:00 AM  (Arrive by 9:30 AM)  RETURN  30 with Bertram Denver, FNP  [facility] TRANSPLANT SURGERY [place] [facility] REGION) [address]  [place] [address]  [phone]      Apr 16, 2020 11:30  AM  (Arrive by 11:00 AM)  RETURN NUTRITION with Lanelle Bal, RD/LDN  [facility] NUTRITION SERVICES TRANSPLANT [place] [facility] REGION) [address]  [place] [address]  [phone]      Apr 16, 2020  1:00 PM  (Arrive by 12:30 PM)  RETURN  PSYCHOLOGY with Adalberto Cole, PhD  [facility] TRANSPLANT SURGERY [place] [facility] REGION) [address]  [place] [address]  [phone]      Jul 16, 2020  9:00 AM  (Arrive by 8:30 AM)  RETURN PHARMD with Jordan Likes, CPP  [facility] TRANSPLANT SURGERY [place] [facility] REGION) [address]  [place] [address]  [phone]      Jul 16, 2020 10:00 AM  (Arrive by 9:30 AM)  RETURN  30 with Bertram Denver, FNP  [facility] TRANSPLANT SURGERY [place] [facility] REGION) [address]  [place] [address]  [phone]

## 2019-12-10 NOTE — Unmapped (Signed)
Kathy Armstrong c/o nausea throughout shift with 2 episodes of emesis. Medicated frequently with IV antiemetics. Not tolerating PO intake, difficult to keep pills down. Plan of care reviewed with patient and family and all questions answered.     Problem: Adult Inpatient Plan of Care  Goal: Plan of Care Review  Outcome: Progressing  Goal: Patient-Specific Goal (Individualized)  Outcome: Progressing  Goal: Absence of Hospital-Acquired Illness or Injury  Outcome: Progressing  Intervention: Identify and Manage Fall Risk  Recent Flowsheet Documentation  Taken 12/09/2019 0800 by Crissie Figures, RN BSN  Safety Interventions:   family at bedside   low bed   isolation precautions   nonskid shoes/slippers when out of bed  Goal: Optimal Comfort and Wellbeing  Outcome: Progressing  Goal: Readiness for Transition of Care  Outcome: Progressing  Goal: Rounds/Family Conference  Outcome: Progressing  Flowsheets (Taken 12/09/2019 1348)  Participants:   case manager   nursing     Problem: Impaired Wound Healing  Goal: Optimal Wound Healing  Outcome: Progressing     Problem: Fall Injury Risk  Goal: Absence of Fall and Fall-Related Injury  Outcome: Progressing  Intervention: Promote Injury-Free Environment  Recent Flowsheet Documentation  Taken 12/09/2019 0800 by Crissie Figures, RN BSN  Safety Interventions:   family at bedside   low bed   isolation precautions   nonskid shoes/slippers when out of bed     Problem: Diarrhea  Goal: Fluid and Electrolyte Balance  Outcome: Progressing  Intervention: Manage Diarrhea  Recent Flowsheet Documentation  Taken 12/09/2019 0800 by Crissie Figures, RN BSN  Isolation Precautions: protective precautions maintained

## 2019-12-10 NOTE — Unmapped (Signed)
VSS. Pt still experiencing mild nausea, no vomiting. Controlled with PRN anti-emetics. Still having loose stools, team notified when they rounded. Deaccessed port and discharged home.   Lavone Nian Rhian Funari    Problem: Adult Inpatient Plan of Care  Goal: Plan of Care Review  Outcome: Discharged to Home  Goal: Patient-Specific Goal (Individualized)  Outcome: Discharged to Home  Goal: Absence of Hospital-Acquired Illness or Injury  Outcome: Discharged to Home  Goal: Optimal Comfort and Wellbeing  Outcome: Discharged to Home  Goal: Readiness for Transition of Care  Outcome: Discharged to Home  Goal: Rounds/Family Conference  Outcome: Discharged to Home     Problem: Impaired Wound Healing  Goal: Optimal Wound Healing  Outcome: Discharged to Home     Problem: Fall Injury Risk  Goal: Absence of Fall and Fall-Related Injury  Outcome: Discharged to Home     Problem: Diarrhea  Goal: Fluid and Electrolyte Balance  Outcome: Discharged to Home

## 2019-12-10 NOTE — Unmapped (Signed)
Transplant Surgery Progress Note    Hospital Day: 5    Assessment:     Kathy Armstrong is a 49 y.o. 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      Plan:     Neuro:   - Multimodal pain control    CV:   - HDS    Pulm:  - Encourage IS    FEN/GI:  - F: 500cc albumin 5%  - E: Replete PRN  - N: Regular diet  - GI: Holding bowel regimen  - S/p ERCP x2 for biliary anastomotic stricture    Renal/GU:   - Voiding spontaneously    Heme/ID:   - Zosyn for empiric coverage peri-ERCP, will plan to continue while inpatient and discharge with a course of cipro  - Home immunosuppression medications restarted    Ppx:   - SCDs, holding SQH    Dispo:  - Possible discharge today    Subjective/Interval Events:     Feels well this morning. Reports feeling hungry.    Objective:      Vital Signs:  BP 166/76  - Pulse 71  - Temp 36.8 ??C (Oral)  - Resp 12  - Ht 157.5 cm (5' 2)  - Wt 57.6 kg (127 lb)  - SpO2 99%  - BMI 23.23 kg/m??     Physical Exam:    General: comfortable-appearing, in no acute distress  Neuro: alert and oriented, conversational and appropriate  CV: regular rate  Pulm: unlabored breathing on room air  Abd: soft, mildly tender to palpation LUQ > epigastric region, nondistended. Mercedes incision well-healed.

## 2019-12-10 NOTE — Unmapped (Signed)
Transplant Surgery Progress Note    Hospital Day: 5    Assessment:     Kathy Armstrong is a 49 y.o. 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    Interval: doing well, po intake improved. Likely home today.       Plan:     Neuro:   - Multimodal pain control    CV:   - HDS    Pulm:  - Encourage IS    FEN/GI:  - E: Replete PRN  - N: Regular diet  - GI: Holding bowel regimen  - S/p ERCP x2 for biliary anastomotic stricture    Renal/GU:   - Voiding spontaneously    Heme/ID:   - Zosyn for empiric coverage peri-ERCP, will plan to continue while inpatient and discharge with a course of cipro  - Home immunosuppression medications restarted    Ppx:   - SCDs, holding SQH    Dispo:  - Possible discharge today      Objective:      Vital Signs:  BP 156/66  - Pulse 80  - Temp 37.1 ??C (Oral)  - Resp 19  - Ht 157.5 cm (5' 2)  - Wt 57.6 kg (127 lb)  - SpO2 96%  - BMI 23.23 kg/m??     Physical Exam:    General: comfortable-appearing, in no acute distress  Neuro: alert and oriented, conversational and appropriate  CV: regular rate  Pulm: unlabored breathing on room air  Abd: soft, mildly tender to palpation LUQ > epigastric region, nondistended. Mercedes incision well-healed.

## 2019-12-12 ENCOUNTER — Encounter: Payer: Self-pay | Admitting: *Deleted

## 2019-12-12 ENCOUNTER — Other Ambulatory Visit: Payer: Self-pay | Admitting: *Deleted

## 2019-12-12 DIAGNOSIS — Z79899 Other long term (current) drug therapy: Principal | ICD-10-CM

## 2019-12-12 DIAGNOSIS — Z944 Liver transplant status: Principal | ICD-10-CM

## 2019-12-12 LAB — IMMUNOGLOBULIN SUBCLASS IGG4
IMMUNOGLOBULIN SUBCLASS IGG4: 2.8 mg/dL
IgG subclass 4:MCnc:Pt:Ser:Qn:: 2.8

## 2019-12-12 NOTE — Patient Outreach (Signed)
[place] [facility]) Care Management  12/12/2019  Christine Butler 1970-12-28 [id number]  Transition of care call/case closure   Referral received:10/21/19 Initial outreach:11/11/19 Insurance: [employer]    Subjective: Initial successful telephone call to patient's preferred number in order to complete transition of care assessment; 2 HIPAA identifiers verified. Explained purpose of call and completed transition of care assessment.  Christine Butler states that she is doing better, she discussed recent admission related to abdominal pain, reports having ERCP , biliary stricture and able to place stent after second ERCP . She  denies post-operative problems,  states surgical pain well managed with prescribed medications. She continues to take antibiotic to complete course. She discussed still working on improving diet , states its like she is starting over after 2 recent admissions.Discussed taking in small frequent meals through out the day to improve nutrition and intake.  She reports nauseas controlled no vomiting.  She  denies bowel or bladder problems.  Patient sister continues to assist with her  recovery. She reports continuing to tolerate pre admission mobility in home and to climb steps to 2nd level to take a shower but states she can tell that her legs are weaker. Patient reports plan to continue outpatient physical therapy, next visit on 10/19,   Reviewed accessing the following [place] Benefits :  She discussed  ongoing health issues of hypertension  and says she is agreeable  referral to one of the [place] chronic disease management programs if she is not already enrolled, as she thought she was.  Shedoes not have the hospital indemnity She uses a [facility] outpatient pharmacy at [employer]  Reminded patient of open enrollment for benefits ends on October 29, benefit contact provided.   Objective:  [facility] [place] [facility] Liver  TransplantComorbidities include: Endstage liver disease , cirrhosis, primary cholangitis, gastric varices s/p banding, heart failure, cabg, agitated Catatonia.  Shewas discharged to home on 9/17/2 outpatient lab work at [facility], and outpatient physical therapy to be arranged, patient was provided Designer, television/film set. Patient read[facility] to [facility] 10/5[facility]10[facility]8/77for Urinary tract infection Patient hospitalized at [facility] 10/12-10/16 Dx: Pancreatitis, ERCP , biliary stricture with stent placed.  Assessment:  Patient voices good understanding of all discharge instructions.  See transition of care flowsheet for assessment details.   Plan:  Reviewed hospital discharge diagnosis of Pancreatitis, biliary stricture and stent placed.  and discharge treatment plan using hospital discharge instructions, assessing medication adherence, reviewing problems requiring provider notification, and discussing the importance of follow up with surgeon, primary care provider and/or specialists as directed.  Reviewed [place] healthy lifestyle program information to receive discounted premium for  2023   Step 1: Get  your annual physical  Step 2: Complete your health assessment  Step 3:Identify your current health status and complete the corresponding action step between January 1, and October 25, 2020 .    Using [place] website, with patient agreement enrolled to participate in [place]'s Active Health Management chronic disease management program.    No ongoing care management needs identified so will close case to [facility] services. Will send patient per request information on benefits enrollment to her personal email .    Christine Draft, RN, BSN  [place] Management Coordinator  [phone]- Mobile [phone]- Toll Free Main Office

## 2019-12-13 ENCOUNTER — Ambulatory Visit: Payer: 59 | Admitting: Physical Therapy

## 2019-12-13 ENCOUNTER — Telehealth: Payer: Self-pay | Admitting: Physical Therapy

## 2019-12-13 DIAGNOSIS — Z79899 Other long term (current) drug therapy: Principal | ICD-10-CM

## 2019-12-13 DIAGNOSIS — Z944 Liver transplant status: Principal | ICD-10-CM

## 2019-12-13 DIAGNOSIS — R5381 Other malaise: Principal | ICD-10-CM

## 2019-12-13 LAB — CBC W/ DIFFERENTIAL
BANDED NEUTROPHILS ABSOLUTE COUNT: 0.1 10*3/uL (ref 0.0–0.1)
BASOPHILS ABSOLUTE COUNT: 0 10*3/uL (ref 0.0–0.2)
BASOPHILS RELATIVE PERCENT: 1 %
EOSINOPHILS ABSOLUTE COUNT: 0 10*3/uL (ref 0.0–0.4)
EOSINOPHILS RELATIVE PERCENT: 1 %
HEMATOCRIT: 25.6 % — ABNORMAL LOW (ref 34.0–46.6)
HEMOGLOBIN: 8.6 g/dL — ABNORMAL LOW (ref 11.1–15.9)
IMMATURE GRANULOCYTES: 2 %
LYMPHOCYTES ABSOLUTE COUNT: 0.5 10*3/uL — ABNORMAL LOW (ref 0.7–3.1)
LYMPHOCYTES RELATIVE PERCENT: 11 %
MEAN CORPUSCULAR HEMOGLOBIN CONC: 33.6 g/dL (ref 31.5–35.7)
MEAN CORPUSCULAR HEMOGLOBIN: 33.3 pg — ABNORMAL HIGH (ref 26.6–33.0)
MEAN CORPUSCULAR VOLUME: 99 fL — ABNORMAL HIGH (ref 79–97)
MONOCYTES RELATIVE PERCENT: 9 %
PLATELET COUNT: 122 10*3/uL — ABNORMAL LOW (ref 150–450)
RED BLOOD CELL COUNT: 2.58 x10E6/uL — CL (ref 3.77–5.28)
RED CELL DISTRIBUTION WIDTH: 15.4 % (ref 11.7–15.4)
WHITE BLOOD CELL COUNT: 4.1 10*3/uL (ref 3.4–10.8)

## 2019-12-13 LAB — COMPREHENSIVE METABOLIC PANEL
ALBUMIN: 4 g/dL (ref 3.8–4.8)
ALKALINE PHOSPHATASE: 316 IU/L — ABNORMAL HIGH (ref 44–121)
ALT (SGPT): 34 IU/L — ABNORMAL HIGH (ref 0–32)
AST (SGOT): 14 IU/L (ref 0–40)
BILIRUBIN TOTAL: 0.9 mg/dL (ref 0.0–1.2)
BLOOD UREA NITROGEN: 9 mg/dL (ref 6–24)
BUN / CREAT RATIO: 9 (ref 9–23)
CALCIUM: 9.1 mg/dL (ref 8.7–10.2)
CHLORIDE: 104 mmol/L (ref 96–106)
CO2: 20 mmol/L (ref 20–29)
CREATININE: 0.97 mg/dL (ref 0.57–1.00)
GLUCOSE: 102 mg/dL — ABNORMAL HIGH (ref 65–99)
POTASSIUM: 3.9 mmol/L (ref 3.5–5.2)
SODIUM: 139 mmol/L (ref 134–144)
TOTAL PROTEIN: 6.1 g/dL (ref 6.0–8.5)

## 2019-12-13 LAB — GAMMA GLUTAMYL TRANSFERASE: Gamma glutamyl transferase:CCnc:Pt:Ser/Plas:Qn:: 174 — ABNORMAL HIGH

## 2019-12-13 LAB — CHLORIDE: Chloride:SCnc:Pt:Ser/Plas:Qn:: 104

## 2019-12-13 LAB — MONOCYTES ABSOLUTE COUNT: Monocytes:NCnc:Pt:Bld:Qn:Automated count: 0.4

## 2019-12-13 LAB — PHOSPHORUS, SERUM: Phosphate:MCnc:Pt:Ser/Plas:Qn:: 2.8 — ABNORMAL LOW

## 2019-12-13 LAB — MAGNESIUM: Magnesium:MCnc:Pt:Ser/Plas:Qn:: 1.8

## 2019-12-13 LAB — BILIRUBIN DIRECT: Bilirubin.glucuronidated+Bilirubin.albumin bound:MCnc:Pt:Ser/Plas:Qn:: 0.44 — ABNORMAL HIGH

## 2019-12-13 NOTE — Unmapped (Signed)
[facility] Clinical Assessment & Refill Coordination Note    Kathy Armstrong, DOB: [birth year]  Phone: [phone] (home)     All above HIPAA information was verified with patient.     Was a Nurse, learning disability used for this call? No    Specialty Medication(s):   Transplant: valgancyclovir 450mg  and Prednisone 5mg      Current Outpatient Medications   Medication Sig Dispense Refill   ??? acetaminophen (TYLENOL) 325 MG tablet Take 2 tablets (650 mg total) by mouth every six (6) hours as needed for pain or fever (> 100.4 F). 100 tablet 0   ??? acetaminophen (TYLENOL) 500 MG tablet Take 1,000 mg by mouth every eight (8) hours as needed.     ??? alirocumab (PRALUENT PEN) 150 mg/mL subcutaneous injection Inject 150 mg under the skin every fourteen (14) days.     ??? aspirin (ASPIR-81) 81 MG tablet Take 1 tablet (81 mg total) by mouth daily. 100 tablet 10   ??? carvediloL (COREG) 25 MG tablet Take 1 tablet (25 mg total) by mouth Two (2) times a day. 60 tablet 11   ??? ciprofloxacin HCl (CIPRO) 500 MG tablet Take 1 tablet (500 mg total) by mouth Two (2) times a day for 7 days. 14 tablet 0   ??? ergocalciferol-1,250 mcg, 50,000 unit, (DRISDOL) 1,250 mcg (50,000 unit) capsule Take 1 capsule (1,250 mcg total) by mouth once a week. 8 capsule 0   ??? fluticasone propionate (FLONASE) 50 mcg/actuation nasal spray Use 1 spray into each nostril daily as needed for rhinitis. 16 g 5   ??? folic acid (FOLVITE) 1 MG tablet Take 1 tablet (1 mg total) by mouth daily. 30 tablet 11   ??? loperamide (IMODIUM) 2 mg capsule Take 2 capsules (4 mg total) by mouth Three (3) times a day as needed for diarrhea. 100 capsule 5   ??? loratadine (CLARITIN) 10 mg tablet Take 10 mg by mouth daily.     ??? magnesium oxide-Mg AA chelate (MAGNESIUM, AMINO ACID CHELATE,) 133 mg Tab Take 1 tablet by mouth Two (2) times a day. 100 tablet 11   ??? melatonin 3 mg Tab Take 1 tablet (3 mg total) by mouth every evening. 100 tablet 3   ??? multivit with min #[id number] (DEKAS PLUS, FOLIC ACID,) 200 mcg-1,000 UJW-11 mg cap Take 1 tablet by mouth. Take 1 tablet by mouth daily     ??? mycophenolate (MYFORTIC) 180 MG EC tablet Take 1 tablet (180 mg total) by mouth Two (2) times a day. 60 tablet 11   ??? NON FORMULARY Take 1,000 mg by mouth two (2) times a day. Vitafusion Calcium: Take two gummies by mouth twice daily     ??? OLANZapine (ZYPREXA) 5 MG tablet Take 1 tablet (5 mg total) by mouth nightly. 30 tablet 11   ??? omeprazole (PRILOSEC) 40 MG capsule Take 1 capsule (40 mg total) by mouth daily. 30 capsule 5   ??? ondansetron (ZOFRAN-ODT) 8 MG disintegrating tablet Dissolve 1 tablet (8 mg total) by mouth every eight (8) hours as needed. 30 tablet 0   ??? pentamidine in sterile water (PF) Inhale 6 mL (300 mg total) every twenty-eight (28) days. 6 mL 5   ??? predniSONE (DELTASONE) 5 MG tablet Take 1 tablet (5 mg total) by mouth daily. 30 tablet 11   ??? tacrolimus (PROGRAF) 1 MG capsule Take 4 capsules (4 mg total) by mouth two (2) times a day. 270 capsule 11   ??? ursodioL (ACTIGALL) 300  mg capsule Take 1 capsule (300 mg total) by mouth Three (3) times a day. 90 capsule 11   ??? valGANciclovir (VALCYTE) 450 mg tablet Take 1 tablet (450 mg total) by mouth daily. 30 tablet 2     No current facility-administered medications for this visit.     Facility-Administered Medications Ordered in Other Visits   Medication Dose Route Frequency Provider Last Rate Last Admin   ??? albuterol 2.5 mg /3 mL (0.083 %) nebulizer solution 2.5 mg  2.5 mg Nebulization Once PRN Alexey Zendel, MD       ??? pentamidine (PENTAM) inhalation solution  300 mg Inhalation Once Loney Hering, MD            Changes to medications: Aanika reports no changes at this time.    Allergies   Allergen Reactions   ??? Codeine Nausea And Vomiting   ??? Erythromycin Nausea And Vomiting   ??? Fentanyl Nausea And Vomiting       Changes to allergies: No    SPECIALTY MEDICATION ADHERENCE     Valganciclovir 450 mg: 5 days of medicine on hand   Prednsione 5 mg: 5 days of medicine on hand       Medication Adherence    Patient reported X missed doses in the last month: 0  Specialty Medication: Valganciclovir 450mg   Patient is on additional specialty medications: Yes  Additional Specialty Medications: Prednisone 5mg   Patient Reported Additional Medication X Missed Doses in the Last Month: 0  Patient is on more than two specialty medications: No          Specialty medication(s) dose(s) confirmed: Regimen is correct and unchanged.     Are there any concerns with adherence? No    Adherence counseling provided? Not needed    CLINICAL MANAGEMENT AND INTERVENTION      Clinical Benefit Assessment:    Do you feel the medicine is effective or helping your condition? Yes    Clinical Benefit counseling provided? Not needed    Adverse Effects Assessment:    Are you experiencing any side effects? No    Are you experiencing difficulty administering your medicine? No    Quality of Life Assessment:    How many days over the past month did your liver transplant  keep you from your normal activities? For example, brushing your teeth or getting up in the morning. 0    Have you discussed this with your provider? Not needed    Therapy Appropriateness:    Is therapy appropriate? Yes, therapy is appropriate and should be continued    DISEASE/MEDICATION-SPECIFIC INFORMATION      N/A    PATIENT SPECIFIC NEEDS     - Does the patient have any physical, cognitive, or cultural barriers? No    - Is the patient high risk? No    - Does the patient require a Care Management Plan? No     - Does the patient require physician intervention or other additional services (i.e. nutrition, smoking cessation, social work)? No      SHIPPING     Specialty Medication(s) to be Shipped:   Transplant: Prednisone 5mg   Patient declined valganciclovir today.  States she is getting at different pharmacy.  Other medication(s) to be shipped: olanzapine     Changes to insurance: No    Delivery Scheduled: Yes, Expected medication delivery date: 12/15/19.     Medication will be delivered via UPS to the confirmed prescription address in [facility].    The patient will  receive a drug information handout for each medication shipped and additional FDA Medication Guides as required.  Verified that patient has previously received a Conservation officer, historic buildings.    All of the patient's questions and concerns have been addressed.    Tera Helper   [facility] Specialty Pharmacist

## 2019-12-13 NOTE — Unmapped (Signed)
Received message from patient:  ---- Message -----       From:Jaylea Maurine Minister       Sent:12/13/2019  8:45 AM EDT         ZO:XWRUEAVW L Emmely Bittinger    Subject:Physical therapy    [name],     Thank you for getting that scheduled . I see it in MyChart . I'll schedule my breathing treatment when I go that day !     I just got a call from PT and they said they need an updated referral since I was in the hospital and had two surgeries.  He said since I hade a change of. status they need they new referral. Thank you     Soni     Sent message to Gertie Fey, NP to be aware of the above with re-referral.     Successfully faxed new referral and order for PT to eval and treat for deconditioning s/p liver txp and second hospitalization recently after 4 day admission including ERCP to [facility] outpatient rehab (fax: [phone]).     Sent message back to patient:  Hi Adrina.     I just faxed manually from the office a new referral and order for PT to evaluate and treat from recent 4 day admission with GI procedure and due to liver transplant.     So, hopefully they can resume your physical therapy soon.     Your labs from this week are nice and stable. We are waiting on the tacrolimus level.     Hope you are well,  [name]

## 2019-12-13 NOTE — Unmapped (Signed)
-----   Message -----       From:Colin Maurine Minister       Sent:12/12/2019 11:55 AM EDT         ZO:XWRUEAVW Karsten Ro    Subject:RE: Test Results Question    [place] Faster!    I hope you had a good weekend ! Dr Doyne Keel told me Saturday he wanted a follow up in two weeks from my hospital stay. What is the number to where I can schedule that . I do have clinic follow ups scheduled but wasn't sure the exact date off the top of my head . Plus I do need to schedule my breathing treatment as well . Thanks !    Berenize     Sent message to patient:  Hi.     The inpatient coordinator sent a message this morning to Priya who schedules these appointments and to me.     B/c it was to be done in 2 weeks, you were scheduled on Thursday, October 28. I asked for the nurse visit for your labs to be done through the port, pharmacy appointment, and then you will see Dr. Matilde Haymaker.     If you cannot see them in MyChart on that date, then let us know.     Hope you had a good weekend. I am sorry that you were in the hospital instead of seeing [facility]. Hope you can have a rain check event in its place.     I asked when you were admitted if you could have your pentamidine inpatient. When I bumped into your sister last week, she mentioned you were able to have this done. So, do you need to schedule the one for November? If so, then you can wait to schedule it when you come on October 28 if easier.    Take care,  [name]

## 2019-12-13 NOTE — Telephone Encounter (Signed)
Spoke with pt today regarding being admitted to the hospital and having multiple procedures performed since she was last seen in physical therapy. Due to change in status we would need an updated referral for clearance from her referring provider to resume physical therapy. Pt verbalized understanding and will cancel today's visit, and if she is able to get an updated referral we will plan to see her on her next scheduled appointment.    Becci Batty PT, DPT, LAT, ATC  12/13/19  8:43 AM

## 2019-12-14 MED FILL — PREDNISONE 5 MG TABLET: 30 days supply | Qty: 30 | Fill #0 | Status: AC

## 2019-12-15 ENCOUNTER — Ambulatory Visit: Payer: 59 | Admitting: Physical Therapy

## 2019-12-15 ENCOUNTER — Encounter: Payer: Self-pay | Admitting: Physical Therapy

## 2019-12-15 ENCOUNTER — Other Ambulatory Visit: Payer: Self-pay

## 2019-12-15 DIAGNOSIS — Z944 Liver transplant status: Principal | ICD-10-CM

## 2019-12-15 DIAGNOSIS — Z79899 Other long term (current) drug therapy: Principal | ICD-10-CM

## 2019-12-15 DIAGNOSIS — M6281 Muscle weakness (generalized): Secondary | ICD-10-CM | POA: Diagnosis not present

## 2019-12-15 DIAGNOSIS — R2681 Unsteadiness on feet: Secondary | ICD-10-CM | POA: Diagnosis not present

## 2019-12-15 DIAGNOSIS — R293 Abnormal posture: Secondary | ICD-10-CM

## 2019-12-15 DIAGNOSIS — R2689 Other abnormalities of gait and mobility: Secondary | ICD-10-CM

## 2019-12-15 DIAGNOSIS — M62830 Muscle spasm of back: Secondary | ICD-10-CM

## 2019-12-15 LAB — TACROLIMUS BLOOD: Tacrolimus:MCnc:Pt:Bld:Qn:LC/MS/MS: 8.3

## 2019-12-15 NOTE — Unmapped (Signed)
Had received VM from Lorayne Bender with [facility] saying patient mentioned we faxed orders to be re-referred for PT and he asked if they could be re-faxed since they cannot find and to send to the [phone] fax (that we sent to before).   Sent fax again with the confirmation page we received on 12/13/19 and then called at [phone] and staff confirmed they did have the referral and Onalee Hua is seeing patient right now.

## 2019-12-16 ENCOUNTER — Encounter: Payer: Self-pay | Admitting: Physical Therapy

## 2019-12-16 LAB — CBC W/ DIFFERENTIAL
BANDED NEUTROPHILS ABSOLUTE COUNT: 0.1 10*3/uL (ref 0.0–0.1)
BASOPHILS ABSOLUTE COUNT: 0 10*3/uL (ref 0.0–0.2)
BASOPHILS RELATIVE PERCENT: 1 %
EOSINOPHILS ABSOLUTE COUNT: 0 10*3/uL (ref 0.0–0.4)
EOSINOPHILS RELATIVE PERCENT: 1 %
HEMATOCRIT: 24.4 % — ABNORMAL LOW (ref 34.0–46.6)
HEMOGLOBIN: 8.1 g/dL — ABNORMAL LOW (ref 11.1–15.9)
MEAN CORPUSCULAR HEMOGLOBIN CONC: 33.2 g/dL (ref 31.5–35.7)
MEAN CORPUSCULAR HEMOGLOBIN: 32.4 pg (ref 26.6–33.0)
MEAN CORPUSCULAR VOLUME: 98 fL — ABNORMAL HIGH (ref 79–97)
MONOCYTES ABSOLUTE COUNT: 0.4 10*3/uL (ref 0.1–0.9)
MONOCYTES RELATIVE PERCENT: 12 %
NEUTROPHILS ABSOLUTE COUNT: 2.3 10*3/uL (ref 1.4–7.0)
NEUTROPHILS RELATIVE PERCENT: 67 %
PLATELET COUNT: 129 10*3/uL — ABNORMAL LOW (ref 150–450)
RED BLOOD CELL COUNT: 2.5 x10E6/uL — CL (ref 3.77–5.28)
RED CELL DISTRIBUTION WIDTH: 15.7 % — ABNORMAL HIGH (ref 11.7–15.4)
WHITE BLOOD CELL COUNT: 3.4 10*3/uL (ref 3.4–10.8)

## 2019-12-16 LAB — COMPREHENSIVE METABOLIC PANEL
ALBUMIN: 4 g/dL (ref 3.8–4.8)
ALKALINE PHOSPHATASE: 261 IU/L — ABNORMAL HIGH (ref 44–121)
ALT (SGPT): 23 IU/L (ref 0–32)
AST (SGOT): 17 IU/L (ref 0–40)
BILIRUBIN TOTAL: 0.7 mg/dL (ref 0.0–1.2)
BLOOD UREA NITROGEN: 8 mg/dL (ref 6–24)
BUN / CREAT RATIO: 7 — ABNORMAL LOW (ref 9–23)
CALCIUM: 8.7 mg/dL (ref 8.7–10.2)
CHLORIDE: 105 mmol/L (ref 96–106)
CREATININE: 1.16 mg/dL — ABNORMAL HIGH (ref 0.57–1.00)
GLOBULIN, TOTAL: 2.3 g/dL (ref 1.5–4.5)
GLUCOSE: 109 mg/dL — ABNORMAL HIGH (ref 65–99)
POTASSIUM: 3.7 mmol/L (ref 3.5–5.2)
SODIUM: 141 mmol/L (ref 134–144)

## 2019-12-16 LAB — MAGNESIUM
MAGNESIUM: 1.4 mg/dL — ABNORMAL LOW (ref 1.6–2.3)
Magnesium:MCnc:Pt:Ser/Plas:Qn:: 1.4 — ABNORMAL LOW

## 2019-12-16 LAB — PHOSPHORUS, SERUM: Phosphate:MCnc:Pt:Ser/Plas:Qn:: 3.1

## 2019-12-16 LAB — BILIRUBIN DIRECT: Bilirubin.glucuronidated+Bilirubin.albumin bound:MCnc:Pt:Ser/Plas:Qn:: 0.41 — ABNORMAL HIGH

## 2019-12-16 LAB — BLOOD UREA NITROGEN: Urea nitrogen:MCnc:Pt:Ser/Plas:Qn:: 8

## 2019-12-16 LAB — PLATELET COUNT: Platelets:NCnc:Pt:Bld:Qn:Automated count: 129 — ABNORMAL LOW

## 2019-12-16 LAB — GAMMA GLUTAMYL TRANSFERASE: Gamma glutamyl transferase:CCnc:Pt:Ser/Plas:Qn:: 137 — ABNORMAL HIGH

## 2019-12-16 MED FILL — OLANZapine 5 MG TABS: 5 | 30 days supply | Qty: 30 | Fill #0

## 2019-12-16 NOTE — Therapy (Signed)
[place], [place], [address] Phone: [phone]   Fax:  [phone]  Physical Therapy Treatment  Patient Details  Name: Christine Butler MRN: [id number] Date of Birth: [birth year] Referring Provider (PT): Lauralee Evener Placido Sou, NP    Encounter Date: 12/15/2019   PT End of Session - 12/15/19 1108    Visit Number 3    Number of Visits 17    Date for PT Re-Evaluation 01/19/20    Authorization Type [facility] U[facility]    PT Start Time 1100    PT Stop Time 1140    PT Time Calculation (min) 40 min    Activity Tolerance Patient tolerated treatment well    Behavior During Therapy [facility] for tasks assessed/performed           Past Medical History:  Diagnosis Date  . (HFpEF) heart failure with preserved ejection fraction ([place]) 08/09/2019  . Acute blood loss anemia 04/20/2017  . Arthritis    right knee  . Ascites--mild this admit 11/06/2017   per patient , now stable with meds and lifestlye adjustment  . Atypical chest pain 11/05/2017  . Atypical squamous cells of undetermined significance (ASCUS) on Papanicolaou smear of cervix 08/05/2018  . CAD (coronary artery disease)    a. s/p CABGx2 in 02/2017 (LAD not suitable for PCI), EF normal.  . CAD (coronary artery disease), native coronary artery 03/23/2017  . 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 Cirrhosis ([place]) 03/16/2017  . Coronary artery disease 03/24/2017  . Dyslipidemia, goal LDL below 70 10/13/2017  . Elevated LFTs 04/20/2017  . Familial hyperlipidemia   . GERD (gastroesophageal reflux disease)   . GI bleed 02/01/2018  . H/O LEEP 03/30/2019  . H/O two vessel coronary artery bypass graft 03/30/2019  . High grade squamous intraepithelial lesion (HGSIL) on cytologic smear of cervix 05/04/2019  . Hypo-osmolality and hyponatremia 03/30/2019  . IBS (irritable bowel syndrome)   . Other insomnia 09/05/2019  . PONV (postoperative nausea and vomiting)    i always throw up on waking up , but  last EGD in march had no issues    . Primary biliary cholangitis ([place]) 07/26/2012   Formatting of this note might be different from the original. IMOUPDATE  . Primary biliary cirrhosis ([facility])    cirhosis/liver disease followed by transplant team led by Dr Manuella Ghazi at [facility]   . S/P CABG (coronary artery bypass graft)   . S/P CABG x 2 03/24/2017   LIMA to DIAGONAL Portion of SVG/LEFT RADIAL to LAD  . SVD (spontaneous vaginal delivery)    x 3  . Upper GI bleed 02/01/2018  . Urinary tract bacterial infections     Past Surgical History:  Procedure Laterality Date  . CORONARY ARTERY BYPASS GRAFT N/A 03/24/2017   Procedure: CORONARY ARTERY BYPASS GRAFTING (CABG) x two, using left internal mammary artery, left radial artery, and right leg greater saphenous vein harvested endoscopically;  Surgeon: Ivin Poot, MD;  Location: [place];  Service: Open Heart Surgery;  Laterality: N/A;  . ENDOVEIN HARVEST OF GREATER SAPHENOUS VEIN Right 03/24/2017   Procedure: ENDOVEIN HARVEST OF GREATER SAPHENOUS VEIN;  Surgeon: Ivin Poot, MD;  Location: [facility];  Service: Open Heart Surgery;  Laterality: Right;  . ESOPHAGEAL BANDING  02/01/2018   Procedure: ESOPHAGEAL BANDING;  Surgeon: [name], MD;  Location: [facility];  Service: Gastroenterology;;  . ESOPHAGEAL BANDING N/A 03/29/2018   Procedure: ESOPHAGEAL BANDING;  Surgeon: [name], MD;  Location: [facility];  Service: Gastroenterology;  Laterality: N/A;  . ESOPHAGEAL BANDING N/A 05/21/2018   Procedure: ESOPHAGEAL BANDING;  Surgeon: [name], MD;  Location: [facility];  Service: Gastroenterology;  Laterality: N/A;  . ESOPHAGEAL BANDING N/A 10/11/2018   Procedure: ESOPHAGEAL BANDING;  Surgeon: [name], MD;  Location: [facility];  Service: Gastroenterology;  Laterality: N/A;  . ESOPHAGOGASTRODUODENOSCOPY N/A 03/29/2018   Procedure: ESOPHAGOGASTRODUODENOSCOPY (EGD);  Surgeon: [name], MD;  Location: [facility];  Service: Gastroenterology;   Laterality: N/A;  . ESOPHAGOGASTRODUODENOSCOPY N/A 05/21/2018   Procedure: ESOPHAGOGASTRODUODENOSCOPY (EGD);  Surgeon: [name], MD;  Location: [facility];  Service: Gastroenterology;  Laterality: N/A;  . ESOPHAGOGASTRODUODENOSCOPY (EGD) WITH PROPOFOL N/A 02/01/2018   Procedure: ESOPHAGOGASTRODUODENOSCOPY (EGD) WITH PROPOFOL;  Surgeon: [name], MD;  Location: [facility];  Service: Gastroenterology;  Laterality: N/A;  . ESOPHAGOGASTRODUODENOSCOPY (EGD) WITH PROPOFOL N/A 10/11/2018   Procedure: ESOPHAGOGASTRODUODENOSCOPY (EGD) WITH PROPOFOL;  Surgeon: [name], MD;  Location: [facility];  Service: Gastroenterology;  Laterality: N/A;  . INCONTINENCE SURGERY    . IR IMAGING GUIDED PORT INSERTION  04/08/2019  . LEEP N/A 03/28/2014   Procedure: LOOP ELECTROSURGICAL EXCISION PROCEDURE (LEEP) cone biopsy;  Surgeon: Cyril Mourning, MD;  Location: [facility];  Service: Gynecology;  Laterality: N/A;  . LEFT HEART CATH AND CORONARY ANGIOGRAPHY N/A 03/23/2017   Procedure: LEFT HEART CATH AND CORONARY ANGIOGRAPHY;  Surgeon: Jettie Booze, MD;  Location: [facility];  Service: Cardiovascular;  Laterality: N/A;  . LIVER BIOPSY     x 2  . RADIAL ARTERY HARVEST Left 03/24/2017   Procedure: RADIAL ARTERY HARVEST;  Surgeon: Ivin Poot, MD;  Location: [facility];  Service: Open Heart Surgery;  Laterality: Left;  . TEE WITHOUT CARDIOVERSION N/A 03/24/2017   Procedure: TRANSESOPHAGEAL ECHOCARDIOGRAM (TEE);  Surgeon: Prescott Gum, Collier Salina, MD;  Location: [facility];  Service: Open Heart Surgery;  Laterality: N/A;  . WISDOM TOOTH EXTRACTION      There were no vitals filed for this visit.   Subjective Assessment - 12/15/19 1105    Subjective Patient reports feeling weak since being discharged. She was discharged on Saturday. She has not had an appatitie    Pertinent History CAD, liver tranplants    Limitations Standing;Walking;Sitting    How long can you sit comfortably? 1 hour    How long can you  stand comfortably? 15 min    How long can you walk comfortably? 15 min    Patient Stated Goals get back to walking a mile, increase stamina, promote strength and return work.    Currently in Pain? Yes    Pain Location --   nausea   Pain Descriptors / Indicators --   nausea   Pain Type Chronic pain    Pain Onset More than a month ago    Pain Frequency Intermittent    Aggravating Factors  standing and walking    Pain Relieving Factors massage              [facility] PT Assessment - 12/15/19 0001      Strength   Right Shoulder Flexion 3+/5    Right Shoulder Extension 3+/5    Right Shoulder ABduction 3+/5    Right Shoulder Internal Rotation 4-/5    Right Shoulder External Rotation 3+/5    Left Shoulder Flexion 3+/5    Left Shoulder Extension 3+/5    Left Shoulder ABduction 3+/5    Left Shoulder Internal Rotation 4-/5    Left Shoulder External Rotation 3/5  Right Hip Flexion 4-/5    Right Hip Extension 4-/5    Right Hip ABduction 3+/5    Right Hip ADduction 4/5    Left Hip Flexion 4-/5    Left Hip Extension 4-/5    Left Hip ABduction 3+/5    Left Hip ADduction 4/5    Right Knee Flexion 4/5    Right Knee Extension 4/5    Left Knee Flexion 4/5    Left Knee Extension 4/5                         [facility] Adult PT Treatment/Exercise - 12/16/19 0001      Knee/Hip Exercises: Seated   Long Arc Quad Strengthening;Both;2 sets;Weights;15 reps   with ball squeeze to promote VMO activation   Long Arc Quad Limitations red    Marching Strengthening;Both;2 sets;15 reps   alternating L/R    Marching Limitations red band     Hamstring Curl 2 sets;15 reps;Right;Left    Hamstring Limitations red band     Abd/Adduction Limitations seated clamshell x20 red.     Sit to Sand 2 sets;10 reps   from raised table      Knee/Hip Exercises: Supine   Short Arc Quad Sets Limitations x15 bilateral     Heel Slides Limitations 2x15     Hip Adduction Isometric Limitations ball squeeze 2x15                    PT Education - 12/15/19 1108    Education Details HEP and symptom management    Person(s) Educated Patient    Methods Explanation;Demonstration;Tactile cues;Verbal cues    Comprehension Verbalized understanding;Returned demonstration;Verbal cues required;Tactile cues required            PT Short Term Goals - 11/24/19 1426      PT SHORT TERM GOAL #1   Title pt to be IND with initial HEP    Time 4    Period Weeks    Status New    Target Date 12/22/19      PT SHORT TERM GOAL #2   Title increase bil hip abductor / extensor strength to >/ 3-/5 to promote hip stability    Time 4    Period Weeks    Status New    Target Date 12/22/19      PT SHORT TERM GOAL #3   Title increase 5 x sit to stand  and TUG to </= 13 seconds to work toward functional / safe transitions    Time 4    Period Weeks    Status New    Target Date 12/22/19             PT Long Term Goals - 11/24/19 1428      PT LONG TERM GOAL #1   Title increase bil LE gross strength to 4+/5 to promtoe hip/ knee stability and promote efficient posture    Time 8    Period Weeks    Status New    Target Date 01/19/20      PT LONG TERM GOAL #2   Title increase bil Gross UE strength to >/= 4+/5 to assist with functional lifting/ carrying / pushing and pull as it relates to potential work related tasks.    Time 8    Period Weeks    Status New    Target Date 01/19/20      PT LONG TERM GOAL #3   Title improve  5 x STS to </= 12 seconds and TUG to </= 10 seconds to for functiona/ efficent and safe mobility    Time 8    Period Weeks    Status New    Target Date 01/19/20      PT LONG TERM GOAL #4   Title pt to be able to stand and walk for >/= 1 hour for functional endurnace required for work tasks and community ambulation with no report of pain    Time 8    Period Weeks    Status New    Target Date 01/19/20      PT LONG TERM GOAL #5   Title increase FOTO score to </=44% limited to demo  improvement in function    Time 8    Period Weeks    Status New    Target Date 01/19/20      Additional Long Term Goals   Additional Long Term Goals Yes      PT LONG TERM GOAL #6   Title pt to be IND with all HEP to maintain and progress current LOF IND    Time 8    Period Weeks    Status New    Target Date 01/19/20                 Plan - 12/15/19 1117    Clinical Impression Statement Patient tolerated ther-ex well. She had no significant increase in pain or nausea. Therapy reviewed ther-ex for home. She is very motivated to get stronger. Therapy will continue to progress as tolerated. She was given a seated band progression of exercises for home. she tolerated well. Therapy is waiting on a resume prescription. She reports it ihas been faxed. Patients strength is the same of eval. endurance not tested today.    Personal Factors and Comorbidities Comorbidity 1    Comorbidities hx of liver transplant, anemia, significant PMHx/ surgical hx    Stability/Clinical Decision Making Evolving/Moderate complexity    Clinical Decision Making Moderate    Rehab Potential Good    PT Frequency 2x / week    PT Duration 8 weeks    PT Treatment/Interventions ADLs/Self Care Home Management;Moist Heat;Cryotherapy;Gait training;Stair training;Functional mobility training;Therapeutic activities;Therapeutic exercise;Balance training;Neuromuscular re-education;Manual techniques;Passive range of motion;Taping;Patient/family education    PT Next Visit Plan FOTO at 6th visit, unable to lay supine due to sutures, reviewe/ update HEP, gross LE/ UE strengthening,   STW for low back (she did well using massage chair), gentle core strengthening,    PT Home Exercise Plan [phone] - low back stretch (seated walking hands on chair), sit to stand with hands on knees, lower trunk rotations, sidelying hip abduction, gripping, finger extnesion with putty/ rubber band.    Consulted and Agree with Plan of Care Patient            Patient will benefit from skilled therapeutic intervention in order to improve the following deficits and impairments:  Abnormal gait, Improper body mechanics, Postural dysfunction, Increased muscle spasms, Decreased strength, Pain, Decreased activity tolerance, Decreased balance, Decreased endurance  Visit Diagnosis: Unsteadiness on feet  Muscle weakness (generalized)  Other abnormalities of gait and mobility  Abnormal posture  Muscle spasm of back     Problem List Patient Active Problem List   Diagnosis Date Noted  . Arthritis   . CAD (coronary artery disease)   . PONV (postoperative nausea and vomiting)   . SVD (spontaneous vaginal delivery)   . Urinary tract bacterial infections   .  Other insomnia 09/05/2019  . 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 (HFpEF) heart failure with preserved ejection fraction ([place]) 08/09/2019  . High grade squamous intraepithelial lesion (HGSIL) on cytologic smear of cervix 05/04/2019  . Familial hyperlipidemia 03/30/2019  . H/O two vessel coronary artery bypass graft 03/30/2019  . H/O LEEP 03/30/2019  . Hypo-osmolality and hyponatremia 03/30/2019  . Atypical squamous cells of undetermined significance (ASCUS) on Papanicolaou smear of cervix 08/05/2018  . Upper GI bleed 02/01/2018  . GI bleed 02/01/2018  . Ascites--mild this admit 11/06/2017  . Atypical chest pain 11/05/2017  . Dyslipidemia, goal LDL below 70 10/13/2017  . Acute blood loss anemia 04/20/2017  . Elevated LFTs 04/20/2017  . Coronary artery disease 03/24/2017  . S/P CABG x 2 03/24/2017  . CAD (coronary artery disease), native coronary artery 03/23/2017  . Cirrhosis ([place]) 03/16/2017  . Primary biliary cholangitis ([place]) 07/26/2012  . GERD (gastroesophageal reflux disease) 01/28/2012  . Primary biliary cirrhosis ([place]) 01/28/2012  . IBS (irritable bowel syndrome) 01/28/2012    Carney Living PT DPT  12/16/2019, 7:48 AM  [facility] [address] [place], [place], [address] Phone: [phone]   Fax:  [phone]  Name: MARCI POLITO MRN: [id number] Date of Birth: 11-10-1970

## 2019-12-19 DIAGNOSIS — Z79899 Other long term (current) drug therapy: Principal | ICD-10-CM

## 2019-12-19 DIAGNOSIS — Z944 Liver transplant status: Principal | ICD-10-CM

## 2019-12-20 ENCOUNTER — Ambulatory Visit: Payer: 59 | Admitting: Physical Therapy

## 2019-12-20 ENCOUNTER — Other Ambulatory Visit: Payer: Self-pay

## 2019-12-20 DIAGNOSIS — Z944 Liver transplant status: Principal | ICD-10-CM

## 2019-12-20 DIAGNOSIS — Z79899 Other long term (current) drug therapy: Principal | ICD-10-CM

## 2019-12-20 DIAGNOSIS — R2689 Other abnormalities of gait and mobility: Secondary | ICD-10-CM

## 2019-12-20 DIAGNOSIS — M62830 Muscle spasm of back: Secondary | ICD-10-CM

## 2019-12-20 DIAGNOSIS — M6281 Muscle weakness (generalized): Secondary | ICD-10-CM | POA: Diagnosis not present

## 2019-12-20 DIAGNOSIS — R2681 Unsteadiness on feet: Secondary | ICD-10-CM | POA: Diagnosis not present

## 2019-12-20 DIAGNOSIS — R293 Abnormal posture: Secondary | ICD-10-CM | POA: Diagnosis not present

## 2019-12-20 LAB — CBC W/ DIFFERENTIAL
BANDED NEUTROPHILS ABSOLUTE COUNT: 0.1 10*3/uL (ref 0.0–0.1)
BASOPHILS ABSOLUTE COUNT: 0.1 10*3/uL (ref 0.0–0.2)
BASOPHILS RELATIVE PERCENT: 1 %
EOSINOPHILS ABSOLUTE COUNT: 0.1 10*3/uL (ref 0.0–0.4)
EOSINOPHILS RELATIVE PERCENT: 2 %
HEMATOCRIT: 24.4 % — ABNORMAL LOW (ref 34.0–46.6)
HEMOGLOBIN: 8.2 g/dL — ABNORMAL LOW (ref 11.1–15.9)
IMMATURE GRANULOCYTES: 4 %
LYMPHOCYTES ABSOLUTE COUNT: 0.6 10*3/uL — ABNORMAL LOW (ref 0.7–3.1)
MEAN CORPUSCULAR HEMOGLOBIN CONC: 33.6 g/dL (ref 31.5–35.7)
MEAN CORPUSCULAR HEMOGLOBIN: 32.3 pg (ref 26.6–33.0)
MEAN CORPUSCULAR VOLUME: 96 fL (ref 79–97)
MONOCYTES RELATIVE PERCENT: 10 %
NEUTROPHILS ABSOLUTE COUNT: 2.5 10*3/uL (ref 1.4–7.0)
NEUTROPHILS RELATIVE PERCENT: 66 %
PLATELET COUNT: 128 10*3/uL — ABNORMAL LOW (ref 150–450)
RED BLOOD CELL COUNT: 2.54 x10E6/uL — CL (ref 3.77–5.28)
RED CELL DISTRIBUTION WIDTH: 15.8 % — ABNORMAL HIGH (ref 11.7–15.4)
WHITE BLOOD CELL COUNT: 3.7 10*3/uL (ref 3.4–10.8)

## 2019-12-20 LAB — COMPREHENSIVE METABOLIC PANEL
ALBUMIN: 3.8 g/dL (ref 3.8–4.8)
ALKALINE PHOSPHATASE: 202 IU/L — ABNORMAL HIGH (ref 44–121)
ALT (SGPT): 14 IU/L (ref 0–32)
AST (SGOT): 20 IU/L (ref 0–40)
BILIRUBIN TOTAL: 0.6 mg/dL (ref 0.0–1.2)
BUN / CREAT RATIO: 8 — ABNORMAL LOW (ref 9–23)
CALCIUM: 8.9 mg/dL (ref 8.7–10.2)
CHLORIDE: 107 mmol/L — ABNORMAL HIGH (ref 96–106)
CREATININE: 1.22 mg/dL — ABNORMAL HIGH (ref 0.57–1.00)
GLOBULIN, TOTAL: 1.9 g/dL (ref 1.5–4.5)
GLUCOSE: 99 mg/dL (ref 65–99)
POTASSIUM: 3.6 mmol/L (ref 3.5–5.2)
SODIUM: 143 mmol/L (ref 134–144)
TOTAL PROTEIN: 5.7 g/dL — ABNORMAL LOW (ref 6.0–8.5)

## 2019-12-20 LAB — MAGNESIUM: Magnesium:MCnc:Pt:Ser/Plas:Qn:: 1.3 — ABNORMAL LOW

## 2019-12-20 LAB — TACROLIMUS BLOOD: Tacrolimus:MCnc:Pt:Bld:Qn:LC/MS/MS: 9.7

## 2019-12-20 LAB — CHLORIDE: Chloride:SCnc:Pt:Ser/Plas:Qn:: 107 — ABNORMAL HIGH

## 2019-12-20 LAB — BILIRUBIN DIRECT: Bilirubin.glucuronidated+Bilirubin.albumin bound:MCnc:Pt:Ser/Plas:Qn:: 0.32

## 2019-12-20 LAB — PHOSPHORUS, SERUM: Phosphate:MCnc:Pt:Ser/Plas:Qn:: 3.7

## 2019-12-20 LAB — PHOSPHORUS: PHOSPHORUS, SERUM: 3.7 mg/dL (ref 3.0–4.3)

## 2019-12-20 LAB — GAMMA GLUTAMYL TRANSFERASE: Gamma glutamyl transferase:CCnc:Pt:Ser/Plas:Qn:: 96 — ABNORMAL HIGH

## 2019-12-20 LAB — NEUTROPHILS ABSOLUTE COUNT: Neutrophils:NCnc:Pt:Bld:Qn:Automated count: 2.5

## 2019-12-20 NOTE — Unmapped (Signed)
Sent patient message:  Hi Kathy Armstrong.     I hope you are well. We received your 12/15/19 tacrolimus level today and it was in goal.     Your 10/25 labs also dropped in and your liver labs are nice and stable.     The magnesium level decreased to 1.3. Has there been a difference in your diet or missed any doses of magnesium?    We will see another level on Thursday when you have your labs drawn.     Take care,  [name]

## 2019-12-20 NOTE — Therapy (Addendum)
[place], [place], [address] Phone: [phone]   Fax:  [phone]  Physical Therapy Treatment  Patient Details  Name: Christine Butler MRN: [id number] Date of Birth: [birth year] Referring Provider (PT): Lauralee Evener Placido Sou, NP    Encounter Date: 12/20/2019   PT End of Session - 12/20/19 1328    Visit Number 4    Number of Visits 17    Date for PT Re-Evaluation 01/19/20    Authorization Type [facility]    PT Start Time 1330    PT Stop Time 1415    PT Time Calculation (min) 45 min    Activity Tolerance Patient tolerated treatment well    Behavior During Therapy [facility] for tasks assessed/performed           Past Medical History:  Diagnosis Date  . (HFpEF) heart failure with preserved ejection fraction ([place]) 08/09/2019  . Acute blood loss anemia 04/20/2017  . Arthritis    right knee  . Ascites--mild this admit 11/06/2017   per patient , now stable with meds and lifestlye adjustment  . Atypical chest pain 11/05/2017  . Atypical squamous cells of undetermined significance (ASCUS) on Papanicolaou smear of cervix 08/05/2018  . CAD (coronary artery disease)    a. s/p CABGx2 in 02/2017 (LAD not suitable for PCI), EF normal.  . CAD (coronary artery disease), native coronary artery 03/23/2017  . 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 Cirrhosis ([place]) 03/16/2017  . Coronary artery disease 03/24/2017  . Dyslipidemia, goal LDL below 70 10/13/2017  . Elevated LFTs 04/20/2017  . Familial hyperlipidemia   . GERD (gastroesophageal reflux disease)   . GI bleed 02/01/2018  . H/O LEEP 03/30/2019  . H/O two vessel coronary artery bypass graft 03/30/2019  . High grade squamous intraepithelial lesion (HGSIL) on cytologic smear of cervix 05/04/2019  . Hypo-osmolality and hyponatremia 03/30/2019  . IBS (irritable bowel syndrome)   . Other insomnia 09/05/2019  . PONV (postoperative nausea and vomiting)    i always throw up on waking up , but  last EGD in march had no issues    . Primary biliary cholangitis ([place]) 07/26/2012   Formatting of this note might be different from the original. IMOUPDATE  . Primary biliary cirrhosis ([facility])    cirhosis/liver disease followed by transplant team led by Dr Manuella Ghazi at [facility]   . S/P CABG (coronary artery bypass graft)   . S/P CABG x 2 03/24/2017   LIMA to DIAGONAL Portion of SVG/LEFT RADIAL to LAD  . SVD (spontaneous vaginal delivery)    x 3  . Upper GI bleed 02/01/2018  . Urinary tract bacterial infections     Past Surgical History:  Procedure Laterality Date  . CORONARY ARTERY BYPASS GRAFT N/A 03/24/2017   Procedure: CORONARY ARTERY BYPASS GRAFTING (CABG) x two, using left internal mammary artery, left radial artery, and right leg greater saphenous vein harvested endoscopically;  Surgeon: Ivin Poot, MD;  Location: [place];  Service: Open Heart Surgery;  Laterality: N/A;  . ENDOVEIN HARVEST OF GREATER SAPHENOUS VEIN Right 03/24/2017   Procedure: ENDOVEIN HARVEST OF GREATER SAPHENOUS VEIN;  Surgeon: Ivin Poot, MD;  Location: [place];  Service: Open Heart Surgery;  Laterality: Right;  . ESOPHAGEAL BANDING  02/01/2018   Procedure: ESOPHAGEAL BANDING;  Surgeon: [name], MD;  Location: [facility];  Service: Gastroenterology;;  . ESOPHAGEAL BANDING N/A 03/29/2018   Procedure: ESOPHAGEAL BANDING;  Surgeon: [name], MD;  Location: [facility];  Service: Gastroenterology;  Laterality: N/A;  . ESOPHAGEAL BANDING N/A 05/21/2018   Procedure: ESOPHAGEAL BANDING;  Surgeon: [name], MD;  Location: [facility];  Service: Gastroenterology;  Laterality: N/A;  . ESOPHAGEAL BANDING N/A 10/11/2018   Procedure: ESOPHAGEAL BANDING;  Surgeon: [name], MD;  Location: [facility];  Service: Gastroenterology;  Laterality: N/A;  . ESOPHAGOGASTRODUODENOSCOPY N/A 03/29/2018   Procedure: ESOPHAGOGASTRODUODENOSCOPY (EGD);  Surgeon: [name], MD;  Location: [facility];  Service: Gastroenterology;   Laterality: N/A;  . ESOPHAGOGASTRODUODENOSCOPY N/A 05/21/2018   Procedure: ESOPHAGOGASTRODUODENOSCOPY (EGD);  Surgeon: [name], MD;  Location: [facility];  Service: Gastroenterology;  Laterality: N/A;  . ESOPHAGOGASTRODUODENOSCOPY (EGD) WITH PROPOFOL N/A 02/01/2018   Procedure: ESOPHAGOGASTRODUODENOSCOPY (EGD) WITH PROPOFOL;  Surgeon: [name], MD;  Location: [facility];  Service: Gastroenterology;  Laterality: N/A;  . ESOPHAGOGASTRODUODENOSCOPY (EGD) WITH PROPOFOL N/A 10/11/2018   Procedure: ESOPHAGOGASTRODUODENOSCOPY (EGD) WITH PROPOFOL;  Surgeon: [name], MD;  Location: [facility];  Service: Gastroenterology;  Laterality: N/A;  . INCONTINENCE SURGERY    . IR IMAGING GUIDED PORT INSERTION  04/08/2019  . LEEP N/A 03/28/2014   Procedure: LOOP ELECTROSURGICAL EXCISION PROCEDURE (LEEP) cone biopsy;  Surgeon: Cyril Mourning, MD;  Location: [facility];  Service: Gynecology;  Laterality: N/A;  . LEFT HEART CATH AND CORONARY ANGIOGRAPHY N/A 03/23/2017   Procedure: LEFT HEART CATH AND CORONARY ANGIOGRAPHY;  Surgeon: Jettie Booze, MD;  Location: [facility];  Service: Cardiovascular;  Laterality: N/A;  . LIVER BIOPSY     x 2  . RADIAL ARTERY HARVEST Left 03/24/2017   Procedure: RADIAL ARTERY HARVEST;  Surgeon: Ivin Poot, MD;  Location: [place];  Service: Open Heart Surgery;  Laterality: Left;  . TEE WITHOUT CARDIOVERSION N/A 03/24/2017   Procedure: TRANSESOPHAGEAL ECHOCARDIOGRAM (TEE);  Surgeon: Prescott Gum, Collier Salina, MD;  Location: [facility];  Service: Open Heart Surgery;  Laterality: N/A;  . WISDOM TOOTH EXTRACTION      There were no vitals filed for this visit.   Subjective Assessment - 12/20/19 1331    Subjective Pt reports feeling better and that she is pushing it. Some problems in her left hip. Went to daughter's competition and struggled with steps into bandstands.    Pertinent History CAD, liver tranplants    Limitations Standing;Walking;Sitting    How long can you sit  comfortably? 1 hour    How long can you stand comfortably? 15 min    How long can you walk comfortably? 15 min    Patient Stated Goals get back to walking a mile, increase stamina, promote strength and return work.    Currently in Pain? No/denies    Pain Onset More than a month ago                             [facility] Adult PT Treatment/Exercise - 12/20/19 0001      Ambulation/Gait   Stairs Yes    Stairs Assistance 7: Independent    Stair Management Technique Two rails    Number of Stairs 6    Height of Stairs 4    Gait Comments Instruction on step-to pattern and descending stairs safely      Knee/Hip Exercises: Aerobic   Nustep L5 x 6 min UE & LE both  -  53min L5 and 47min L3     Knee/Hip Exercises: Seated   Long Arc Quad --    Marching Strengthening;Both;2 sets;15 reps    Marching Limitations  red band     Abduction/Adduction  AROM;Both;1 set;15 reps -  Seated ball adduction squeeze   Abd/Adduction Limitations seated clamshell x20 red.     Sit to Sand 2 sets;10 reps;without UE support -  cues to squeeze glute at top and slower pace     Knee/Hip Exercises: Supine   Short Arc Quad Sets Limitations 2x20 bilateral on bolster    Heel Slides Limitations 2X20      Shoulder Exercises: Standing   Retraction --                  PT Education - 12/20/19 1333    Education Details reviewed HEP, pacing of exercises    Person(s) Educated Patient    Methods Explanation    Comprehension Verbalized understanding            PT Short Term Goals - 11/24/19 1426      PT SHORT TERM GOAL #1   Title pt to be IND with initial HEP    Time 4    Period Weeks    Status New    Target Date 12/22/19      PT SHORT TERM GOAL #2   Title increase bil hip abductor / extensor strength to >/ 3-/5 to promote hip stability    Time 4    Period Weeks    Status New    Target Date 12/22/19      PT SHORT TERM GOAL #3   Title increase 5 x sit to stand  and TUG to </= 13  seconds to work toward functional / safe transitions    Time 4    Period Weeks    Status New    Target Date 12/22/19             PT Long Term Goals - 11/24/19 1428      PT LONG TERM GOAL #1   Title increase bil LE gross strength to 4+/5 to promtoe hip/ knee stability and promote efficient posture    Time 8    Period Weeks    Status New    Target Date 01/19/20      PT LONG TERM GOAL #2   Title increase bil Gross UE strength to >/= 4+/5 to assist with functional lifting/ carrying / pushing and pull as it relates to potential work related tasks.    Time 8    Period Weeks    Status New    Target Date 01/19/20      PT LONG TERM GOAL #3   Title improve 5 x STS to </= 12 seconds and TUG to </= 10 seconds to for functiona/ efficent and safe mobility    Time 8    Period Weeks    Status New    Target Date 01/19/20      PT LONG TERM GOAL #4   Title pt to be able to stand and walk for >/= 1 hour for functional endurnace required for work tasks and community ambulation with no report of pain    Time 8    Period Weeks    Status New    Target Date 01/19/20      PT LONG TERM GOAL #5   Title increase FOTO score to </=44% limited to demo improvement in function    Time 8    Period Weeks    Status New    Target Date 01/19/20      Additional Long Term Goals   Additional Long Term  Goals Yes      PT LONG TERM GOAL #6   Title pt to be IND with all HEP to maintain and progress current LOF IND    Time 8    Period Weeks    Status New    Target Date 01/19/20                 Plan - 12/20/19 1334    Clinical Impression Statement Pt remains highly motivated to continue therapy but currently waiting on a resume prescription; patient reports it has been faxed. Pt was able to lay supine so focus of treatment today was on review of HEP and management of pacing and fatigue. Pt tolerated exercises well with rest breaks and minimal cuing.    Personal Factors and Comorbidities  Comorbidity 1    Comorbidities hx of liver transplant, anemia, significant PMHx/ surgical hx    Stability/Clinical Decision Making Evolving/Moderate complexity    Rehab Potential Good    PT Frequency 2x / week    PT Duration 8 weeks    PT Treatment/Interventions ADLs/Self Care Home Management;Moist Heat;Cryotherapy;Gait training;Stair training;Functional mobility training;Therapeutic activities;Therapeutic exercise;Balance training;Neuromuscular re-education;Manual techniques;Passive range of motion;Taping;Patient/family education    PT Next Visit Plan FOTO at 6th visit, utilize stair boxes for stair training (esp descent), overall strengthening grossly, increase difficulty of sit to stand    PT Home Exercise Plan [phone] - low back stretch (seated walking hands on chair), sit to stand with hands on knees, lower trunk rotations, sidelying hip abduction, gripping, finger extnesion with putty/ rubber band.    Consulted and Agree with Plan of Care Patient           Patient will benefit from skilled therapeutic intervention in order to improve the following deficits and impairments:  Abnormal gait, Improper body mechanics, Postural dysfunction, Increased muscle spasms, Decreased strength, Pain, Decreased activity tolerance, Decreased balance, Decreased endurance  Visit Diagnosis: No diagnosis found.     Problem List Patient Active Problem List   Diagnosis Date Noted  . Arthritis   . CAD (coronary artery disease)   . PONV (postoperative nausea and vomiting)   . SVD (spontaneous vaginal delivery)   . Urinary tract bacterial infections   . Other insomnia 09/05/2019  . 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 (HFpEF) heart failure with preserved ejection fraction ([place]) 08/09/2019  . High grade squamous intraepithelial lesion (HGSIL) on cytologic smear of cervix 05/04/2019  . Familial hyperlipidemia 03/30/2019  . H/O two vessel coronary artery bypass graft 03/30/2019  . H/O LEEP 03/30/2019  .  Hypo-osmolality and hyponatremia 03/30/2019  . Atypical squamous cells of undetermined significance (ASCUS) on Papanicolaou smear of cervix 08/05/2018  . Upper GI bleed 02/01/2018  . GI bleed 02/01/2018  . Ascites--mild this admit 11/06/2017  . Atypical chest pain 11/05/2017  . Dyslipidemia, goal LDL below 70 10/13/2017  . Acute blood loss anemia 04/20/2017  . Elevated LFTs 04/20/2017  . Coronary artery disease 03/24/2017  . S/P CABG x 2 03/24/2017  . CAD (coronary artery disease), native coronary artery 03/23/2017  . Cirrhosis ([place]) 03/16/2017  . Primary biliary cholangitis ([place]) 07/26/2012  . GERD (gastroesophageal reflux disease) 01/28/2012  . Primary biliary cirrhosis ([place]) 01/28/2012  . IBS (irritable bowel syndrome) 01/28/2012    Carolyne Littles PT DPT  12/20/2019 Dawayne Cirri, SPT 12/20/2019, 3:08 PM   During this treatment session, the therapist was present, participating in and directing the treatment.   [place],  [place], [address] Phone: [phone]   Fax:  [phone]  Name: Christine Butler MRN: [id number] Date of Birth: [birth year]

## 2019-12-21 LAB — TACROLIMUS BLOOD: Tacrolimus:MCnc:Pt:Bld:Qn:LC/MS/MS: 9.8

## 2019-12-22 ENCOUNTER — Telehealth: Payer: Self-pay | Admitting: Physical Therapy

## 2019-12-22 ENCOUNTER — Ambulatory Visit: Payer: 59 | Admitting: Physical Therapy

## 2019-12-22 ENCOUNTER — Other Ambulatory Visit (HOSPITAL_COMMUNITY): Payer: Self-pay | Admitting: Internal Medicine

## 2019-12-22 ENCOUNTER — Ambulatory Visit: Admit: 2019-12-22 | Discharge: 2019-12-22 | Payer: PRIVATE HEALTH INSURANCE

## 2019-12-22 ENCOUNTER — Ambulatory Visit
Admit: 2019-12-22 | Discharge: 2019-12-22 | Payer: PRIVATE HEALTH INSURANCE | Attending: Student in an Organized Health Care Education/Training Program | Primary: Student in an Organized Health Care Education/Training Program

## 2019-12-22 ENCOUNTER — Institutional Professional Consult (permissible substitution): Admit: 2019-12-22 | Discharge: 2019-12-22 | Payer: PRIVATE HEALTH INSURANCE

## 2019-12-22 DIAGNOSIS — Z944 Liver transplant status: Principal | ICD-10-CM

## 2019-12-22 DIAGNOSIS — D649 Anemia, unspecified: Principal | ICD-10-CM

## 2019-12-22 DIAGNOSIS — R1010 Upper abdominal pain, unspecified: Principal | ICD-10-CM

## 2019-12-22 DIAGNOSIS — Z79899 Other long term (current) drug therapy: Principal | ICD-10-CM

## 2019-12-22 DIAGNOSIS — R1013 Epigastric pain: Secondary | ICD-10-CM | POA: Diagnosis not present

## 2019-12-22 DIAGNOSIS — Z23 Encounter for immunization: Secondary | ICD-10-CM | POA: Diagnosis not present

## 2019-12-22 DIAGNOSIS — R519 Headache, unspecified: Secondary | ICD-10-CM | POA: Diagnosis not present

## 2019-12-22 DIAGNOSIS — I2510 Atherosclerotic heart disease of native coronary artery without angina pectoris: Secondary | ICD-10-CM | POA: Diagnosis not present

## 2019-12-22 DIAGNOSIS — K7290 Hepatic failure, unspecified without coma: Secondary | ICD-10-CM | POA: Diagnosis not present

## 2019-12-22 DIAGNOSIS — K859 Acute pancreatitis without necrosis or infection, unspecified: Secondary | ICD-10-CM | POA: Diagnosis not present

## 2019-12-22 DIAGNOSIS — Z5181 Encounter for therapeutic drug level monitoring: Secondary | ICD-10-CM | POA: Diagnosis not present

## 2019-12-22 DIAGNOSIS — D849 Immunodeficiency, unspecified: Secondary | ICD-10-CM | POA: Diagnosis not present

## 2019-12-22 DIAGNOSIS — I509 Heart failure, unspecified: Secondary | ICD-10-CM | POA: Diagnosis not present

## 2019-12-22 LAB — COMPREHENSIVE METABOLIC PANEL
ALBUMIN: 3.4 g/dL (ref 3.4–5.0)
ALKALINE PHOSPHATASE: 176 U/L — ABNORMAL HIGH (ref 46–116)
ALT (SGPT): 11 U/L (ref 10–49)
ANION GAP: 10 mmol/L (ref 5–14)
AST (SGOT): 16 U/L (ref <=34)
BILIRUBIN TOTAL: 0.9 mg/dL (ref 0.3–1.2)
BLOOD UREA NITROGEN: 11 mg/dL (ref 9–23)
BUN / CREAT RATIO: 9
CALCIUM: 8.8 mg/dL (ref 8.7–10.4)
CHLORIDE: 106 mmol/L (ref 98–107)
CO2: 25 mmol/L (ref 20.0–31.0)
CREATININE: 1.21 mg/dL — ABNORMAL HIGH
EGFR CKD-EPI AA FEMALE: 61 mL/min/{1.73_m2} (ref >=60)
EGFR CKD-EPI NON-AA FEMALE: 53 mL/min/{1.73_m2} — ABNORMAL LOW (ref >=60)
GLUCOSE RANDOM: 90 mg/dL (ref 70–179)
POTASSIUM: 3.3 mmol/L — ABNORMAL LOW (ref 3.4–4.5)
PROTEIN TOTAL: 6 g/dL (ref 5.7–8.2)
SODIUM: 141 mmol/L (ref 135–145)

## 2019-12-22 LAB — CBC W/ AUTO DIFF
BASOPHILS ABSOLUTE COUNT: 0 10*9/L (ref 0.0–0.1)
BASOPHILS RELATIVE PERCENT: 0.6 %
EOSINOPHILS ABSOLUTE COUNT: 0.1 10*9/L (ref 0.0–0.4)
EOSINOPHILS RELATIVE PERCENT: 3.5 %
HEMATOCRIT: 22.7 % — ABNORMAL LOW (ref 36.0–46.0)
HEMOGLOBIN: 8.3 g/dL — ABNORMAL LOW (ref 12.0–16.0)
LARGE UNSTAINED CELLS: 2 % (ref 0–4)
LYMPHOCYTES ABSOLUTE COUNT: 0.7 10*9/L — ABNORMAL LOW (ref 1.5–5.0)
LYMPHOCYTES RELATIVE PERCENT: 26.3 %
MEAN CORPUSCULAR HEMOGLOBIN CONC: 36.7 g/dL (ref 31.0–37.0)
MEAN CORPUSCULAR HEMOGLOBIN: 34.9 pg — ABNORMAL HIGH (ref 26.0–34.0)
MEAN CORPUSCULAR VOLUME: 95.2 fL (ref 80.0–100.0)
MEAN PLATELET VOLUME: 8.2 fL (ref 7.0–10.0)
MONOCYTES ABSOLUTE COUNT: 0.1 10*9/L — ABNORMAL LOW (ref 0.2–0.8)
MONOCYTES RELATIVE PERCENT: 5.1 %
NEUTROPHILS ABSOLUTE COUNT: 1.6 10*9/L — ABNORMAL LOW (ref 2.0–7.5)
NEUTROPHILS RELATIVE PERCENT: 63 %
PLATELET COUNT: 123 10*9/L — ABNORMAL LOW (ref 150–440)
RED BLOOD CELL COUNT: 2.39 10*12/L — ABNORMAL LOW (ref 4.00–5.20)
RED CELL DISTRIBUTION WIDTH: 17.3 % — ABNORMAL HIGH (ref 12.0–15.0)
WBC ADJUSTED: 2.6 10*9/L — ABNORMAL LOW (ref 4.5–11.0)

## 2019-12-22 LAB — FERRITIN: FERRITIN: 420.1 ng/mL — ABNORMAL HIGH

## 2019-12-22 LAB — AMYLASE: AMYLASE: 35 U/L (ref 30–118)

## 2019-12-22 LAB — LIPASE: LIPASE: 34 U/L (ref 12–53)

## 2019-12-22 LAB — GAMMA GT: GAMMA GLUTAMYL TRANSFERASE: 80 U/L — ABNORMAL HIGH

## 2019-12-22 LAB — PHOSPHORUS: PHOSPHORUS: 3.2 mg/dL (ref 2.4–5.1)

## 2019-12-22 LAB — BILIRUBIN, DIRECT: BILIRUBIN DIRECT: 0.5 mg/dL — ABNORMAL HIGH (ref 0.00–0.30)

## 2019-12-22 LAB — MAGNESIUM: MAGNESIUM: 1.2 mg/dL — ABNORMAL LOW (ref 1.6–2.6)

## 2019-12-22 LAB — TACROLIMUS LEVEL, TROUGH: TACROLIMUS, TROUGH: 6.5 ng/mL (ref 5.0–15.0)

## 2019-12-22 MED ORDER — TACROLIMUS XR 1 MG TABLET,EXTENDED RELEASE 24 HR
ORAL_TABLET | Freq: Every day | ORAL | 11 refills | 60.00000 days | Status: CP
Start: 2019-12-22 — End: 2019-12-22

## 2019-12-22 MED ORDER — TACROLIMUS XR 4 MG TABLET,EXTENDED RELEASE 24 HR
ORAL_TABLET | ORAL | 11 refills | 0.00000 days | Status: CP
Start: 2019-12-22 — End: 2019-12-22

## 2019-12-22 MED ORDER — ONDANSETRON 8 MG DISINTEGRATING TABLET
ORAL_TABLET | Freq: Three times a day (TID) | ORAL | 1 refills | 10 days | Status: CP | PRN
Start: 2019-12-22 — End: ?

## 2019-12-22 MED ORDER — TACROLIMUS XR 1 MG TABLET,EXTENDED RELEASE 24 HR: 2 mg | tablet | Freq: Every day | 11 refills | 30 days | Status: AC

## 2019-12-22 MED ORDER — TACROLIMUS XR 4 MG TABLET,EXTENDED RELEASE 24 HR: tablet | 11 refills | 0 days | Status: AC

## 2019-12-22 MED ORDER — OMEPRAZOLE 40 MG CAPSULE,DELAYED RELEASE
ORAL_CAPSULE | Freq: Two times a day (BID) | ORAL | 5 refills | 30 days | Status: CP
Start: 2019-12-22 — End: 2020-12-21

## 2019-12-22 MED ORDER — CARVEDILOL 25 MG TABLET
ORAL_TABLET | Freq: Two times a day (BID) | ORAL | 11 refills | 30 days | Status: CP
Start: 2019-12-22 — End: 2020-12-21

## 2019-12-22 MED ADMIN — heparin, porcine (PF) 100 unit/mL injection 500 Units: 500 [IU] | INTRAVENOUS | @ 14:00:00 | Stop: 2019-12-22

## 2019-12-22 MED FILL — ENVARSUS XR 1 MG TB24: 1 | 30 days supply | Qty: 60 | Fill #0

## 2019-12-22 MED FILL — ENVARSUS XR 4 MG TB24: 4 | 30 days supply | Qty: 60 | Fill #0

## 2019-12-22 NOTE — Unmapped (Signed)
Patient's port-a-cath accessed in clinic today for lab draw with brisk blood return noted. Labs collected and sent, port flushed with 20 mLs normal saline and heparin locked per protocol. Please see flowsheets for details. Patient tolerated well with no complications/complaints.

## 2019-12-22 NOTE — Unmapped (Signed)
Pt ID verified with Name and Date of birth. The [employer] Covid-19 Fact Sheet given to patient. All screening questions answered. Verbal consent taken. Vaccine administered as ordered.  See immunization history for documentation.  Pt tolerated well with no issues noted. Recommend that patient stay in observation area for 15 minutes.

## 2019-12-22 NOTE — Unmapped (Signed)
Please see pharmacy visit for additional charge information.

## 2019-12-22 NOTE — Unmapped (Addendum)
[facility]IT[facility] TRANSPLANT CLINIC PHARMACY NOTE  12/22/2019   Jarvis Knodel  [id number]    Medication changes today:   1. Stop folic acid  2. Increase carvedilol to 37.5 mg BID  3. Increase omeprazole to 40 mg BID  4. 3rd COVID vaccine administered today  5. If insurance covers Envarsus will transition from Prograf to Envarsus 5 mg daily    Education/Adherence tools provided today:  1.provided updated medication list  2.  provided additional education on immunosuppression and transplant related medications including reviewing indications of medications, dosing and side effects    Follow up items:  1. goal of understanding indications and dosing of immunosuppression medications  2. Consider rosuvastatin or atorvastatin at future visit (CAD s/p CABG history); patient also followed closely by cardiologist  3. If persistent headache continues consider switching from Prograf to Envarsus  4. Assess heartburn  5. Encourage patient to schedule Reclast    Next visit with pharmacy in 1 month  ____________________________________________________________________    Kathy Armstrong is a 49 y.o. 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     Other PMH significant for HFpEF, CAD s/p CABG x2 (2019), HLD (familial)    Post op complicated by: catatonia w/psychiatry consult.    Admitted 10/5-10/8 w/E Coli UTI.  She was treated w/CTX -> cephalexin.    Interval history: Admitted 10/12-10/16 w/epigastric pain found to have pancreatitis w/biliary stricture.  ERCP was completed on 10/14 which showed biliary stricture and stent was placed.    Seen by pharmacy today for: medication management and pill box fill and adherence education; last seen by pharmacy 2 weeks ago     CC:  Patient complains of  few day history of epigastric tenderness after eating    There were no vitals filed for this visit.    Allergies   Allergen Reactions   ??? Codeine Nausea And Vomiting   ??? Erythromycin Nausea And Vomiting   ??? Fentanyl Nausea And Vomiting       Medications reviewed in EPIC medication station and updated today by the clinical pharmacist practitioner.    Outpatient Encounter Medications as of 12/22/2019   Medication Sig Dispense Refill   ??? acetaminophen (TYLENOL) 325 MG tablet Take 2 tablets (650 mg total) by mouth every six (6) hours as needed for pain or fever (> 100.4 F). 100 tablet 0   ??? acetaminophen (TYLENOL) 500 MG tablet Take 1,000 mg by mouth every eight (8) hours as needed.     ??? alirocumab (PRALUENT PEN) 150 mg/mL subcutaneous injection Inject 150 mg under the skin every fourteen (14) days.     ??? aspirin (ASPIR-81) 81 MG tablet Take 1 tablet (81 mg total) by mouth daily. 100 tablet 10   ??? carvediloL (COREG) 25 MG tablet Take 1 tablet (25 mg total) by mouth Two (2) times a day. 60 tablet 11   ??? [EXPIRED] ciprofloxacin HCl (CIPRO) 500 MG tablet Take 1 tablet (500 mg total) by mouth Two (2) times a day for 7 days. 14 tablet 0   ??? ergocalciferol-1,250 mcg, 50,000 unit, (DRISDOL) 1,250 mcg (50,000 unit) capsule Take 1 capsule (1,250 mcg total) by mouth once a week. 8 capsule 0   ??? fluticasone propionate (FLONASE) 50 mcg/actuation nasal spray Use 1 spray into each nostril daily as needed for rhinitis. 16 g 5   ??? folic acid (FOLVITE) 1 MG tablet Take 1 tablet (1 mg total) by mouth daily. 30 tablet 11   ??? loperamide (IMODIUM) 2  mg capsule Take 2 capsules (4 mg total) by mouth Three (3) times a day as needed for diarrhea. 100 capsule 5   ??? loratadine (CLARITIN) 10 mg tablet Take 10 mg by mouth daily.     ??? magnesium oxide-Mg AA chelate (MAGNESIUM, AMINO ACID CHELATE,) 133 mg Tab Take 1 tablet by mouth Two (2) times a day. 100 tablet 11   ??? melatonin 3 mg Tab Take 1 tablet (3 mg total) by mouth every evening. 100 tablet 3   ??? multivit with min #[id number] (DEKAS PLUS, FOLIC ACID,) 200 mcg-1,000 ZOX-09 mg cap Take 1 tablet by mouth. Take 1 tablet by mouth daily     ??? mycophenolate (MYFORTIC) 180 MG EC tablet Take 1 tablet (180 mg total) by mouth Two (2) times a day. 60 tablet 11   ??? NON FORMULARY Take 1,000 mg by mouth two (2) times a day. Vitafusion Calcium: Take two gummies by mouth twice daily     ??? OLANZapine (ZYPREXA) 5 MG tablet Take 1 tablet (5 mg total) by mouth nightly. 30 tablet 11   ??? omeprazole (PRILOSEC) 40 MG capsule Take 1 capsule (40 mg total) by mouth daily. 30 capsule 5   ??? ondansetron (ZOFRAN-ODT) 8 MG disintegrating tablet Dissolve 1 tablet (8 mg total) by mouth every eight (8) hours as needed. 30 tablet 0   ??? pentamidine in sterile water (PF) Inhale 6 mL (300 mg total) every twenty-eight (28) days. 6 mL 5   ??? predniSONE (DELTASONE) 5 MG tablet Take 1 tablet (5 mg total) by mouth daily. 30 tablet 11   ??? tacrolimus (PROGRAF) 1 MG capsule Take 4 capsules (4 mg total) by mouth two (2) times a day. 270 capsule 11   ??? ursodioL (ACTIGALL) 300 mg capsule Take 1 capsule (300 mg total) by mouth Three (3) times a day. 90 capsule 11   ??? valGANciclovir (VALCYTE) 450 mg tablet Take 1 tablet (450 mg total) by mouth daily. 30 tablet 2     Facility-Administered Encounter Medications as of 12/22/2019   Medication Dose Route Frequency Provider Last Rate Last Admin   ??? albuterol 2.5 mg /3 mL (0.083 %) nebulizer solution 2.5 mg  2.5 mg Nebulization Once PRN Loney Hering, MD       ??? pentamidine (PENTAM) inhalation solution  300 mg Inhalation Once Loney Hering, MD         Induction agent : basiliximab    CURRENT IMMUNOSUPPRESSION: tacrolimus 4 mg PO bid  prograf/Envarsus/cyclosporine goal: 8-10   myfortic180  mg PO bid    prednisone 5 mg daily (per PBC protocol)    Patient complains of significant HA a few days per week and diarrhea that is manageable w/loperamide    IMMUNOSUPPRESSION DRUG LEVELS:  Lab Results   Component Value Date    Tacrolimus, Trough 6.4 12/10/2019    Tacrolimus, Trough 13.9 12/09/2019    Tacrolimus, Trough 19.3 (H) 12/09/2019    Tacrolimus Lvl 9.8 12/19/2019    Tacrolimus Lvl 9.7 12/15/2019    Tacrolimus Lvl 8.3 12/12/2019 No results found for: CYCLO  No results found for: EVEROLIMUS  No results found for: SIROLIMUS    Prograf level is accurate 12 hour trough    Graft function: worsening  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 Investigate insurance coverage for Envarsus 5 mg daily to see if it  helps with HA. Continue to monitor.    OI Prophylaxis:   CMV Status: D+/ R+, moderate risk . CMV prophylaxis: valganciclovir 450 mg daily x 3 months per protocol.  Estimated Creatinine Clearance: 44.1 mL/min (A) (based on SCr of 1.22 mg/dL (H)).  No results found for: CMVCP  PCP Prophylaxis: pentamidine 300 mg inhalation monthly (last dose 10/14) x 6 months (switched from Bactrim 2/2 hyperkalemia)  Thrush:  completed in hospital  Patient is  tolerating infectious prophylaxis well    Plan: Will plan on one more pentamidine dose in November at 3 month visit and then transition to Bactrim in December. Continue to monitor.    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 Continue to monitor.    BP: Goal < 140/90. Clinic vitals reported above  Home BP ranges: 140-150/70s in AM and systolics in 120-130s in PM  Current meds include: carvedilol 25 mg BID  Plan: out of goal, increase carvedilol to 37.5 mg BID. continue to monitor.    Anemia:  H/H:   Lab Results   Component Value Date    HGB 8.2 (L) 12/19/2019     Lab Results   Component Value Date    HCT 24.4 (L) 12/19/2019     Iron panel:  Lab Results   Component Value Date    IRON 138 11/21/2019    TIBC 227 (L) 11/21/2019    FERRITIN 379.9 (H) 11/21/2019     Lab Results   Component Value Date    Iron Saturation (%) 61 11/21/2019     Prior ESA use: none post transplant    Plan: below goal but stable. Continue to monitor.     DM:   Lab Results   Component Value Date    A1C <3.8 (L) 10/17/2019   . Goal A1c < 7  History of Dm? No  Plan:  Continue to monitor    Fluid intake: 40 oz free water daily + some Gatorade  Plan: Increase fluid intake as able - Gatorade ok for now as K+ <4    Electrolytes: wnl  Meds currently on: Mg plus protein 133 mg BID (although admits that she'd held it for 1 week after recent hospitalization 2/2 GI upset)  Plan: Continue to monitor     GI/BM: Pt reports reflux that is worse at night and  nausea most days; appetite improving but still has taste aversions  Meds currently on: loperamide 2 mg TID PRN (~once daily), Dekas Plus 1 tablet daily, omeprazole 40 mg daily, ondansetron 4 mg BID PRN  (~once daily), folic acid 1 mg daily   Plan: Increase omeprazole to 40 mg BID and stop folic acid.  Psych intends to switch the patient from olanzapine to mirtazapine; messaged psychiatry team to ask if can switch sooner rather than later for appetite stimulating benefits. Continue to monitor.     PBC  Meds currently on: urosdiol 300 mg TID  Plan: Continue to monitor    Psych/insomnia - Sleeping very well  Meds currently on: olanzapine 5 mg HS, melatonin 3 mg HS   Plan: Defer to psychiatry.    Pain: pt reports significant HA pain  Meds currently on: APAP 500 mg PRN, oxycodone 5-10 mg q6h PRN (  not using)  Plan: Consider switching to Envarsus to help reduce side effects burden. Continue to monitor    Bone health:   Vitamin D Level: last level is 15.4 (11/21/19). Goal > 30.   Last DEXA results:  04/07/19 osteoporosis of lumbar spine and hip)  Current meds include: Reclast 5 mg q12 months, Vitafusion calcium 2g BID (800 mg elemental calcium), ergocalciferol 50,000 units weekly x8 weeks (first dose 10/4)  Plan: Patient's vitamin D level is out of goal, therefore will continue on ergocalciferol. Note: . Current Scr okay to schedule annual Reclast appointment.  Vitamin D level at next visit Continue to monitor.     Women's/Men's Health:  Yesly Gerety is a 49 y.o. Female perimenopausal. Patient reports no men's/women's health issues  Plan: Continue to monitor    Pharmacy Preference:  [facility] for tacrolimus, Myfortic, and Valcyte; all other meds from [facility]    Medication Access Issues:  none    Adherence: Patient has average understanding of medications; was able to independently identify names/doses of immunosuppressants and OI meds.  Patient  does fill their own pill box on a regular basis at home.  Patient brought medication card:yes  Pill box: did not bring  Plan: Provided extensive adherence counseling/intervention    I spent a total of 25 minutes face to face with the patient delivering clinical care and providing education/counseling. Patient was reviewed with Dr. Matilde Haymaker who was agreement with the stated plan:     During this visit, the following was completed:   BG log data assessment  BP log data assessment  Labs ordered and evaluated  complex treatment plan >1 DS     All questions/concerns were addressed to the patient's satisfaction.  __________________________________________    Cleone Slim, PHARMD, CPP  [employer] TRANSPL[facility] CLINICAL PHARMACIST PRACTITIONER  PAGER [phone]

## 2019-12-22 NOTE — Unmapped (Signed)
TRANSPLANT SURGERY PROGRESS NOTE    Assessment and Plan  49 y.o. 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 Doing well overall. Her epigastric pain is most likely related to GERD, and her omeprazole will be increased to BID. Her headaches are likely related to her Prograf use. I will switch her over to longer-acting Envarsus if her insurance covers this. Her Tacrolimus Troughs have all been within normal limits. Lastly, her pharmacist is working with psychiatry to switch her from [place] to Remeron to cover both her insomnia and her decreased appetite. She is doing well from a hepatobiliary standpoint.     Subjective  Kathy Armstrong is a 49 y.o. 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 She presents today for follow up. She does report some epigastric pain that began 3 days ago after a heavy meal. This pain is dull, located just below her sternum, does not radiate, and is improved with tums and Zofran. She also reports that staying upright after meals helps and that it is at it's worst at night. She denies any nausea or vomiting and reports that her stool frequency has actually improved. She is having a bowel movement x1-2 per day, however they are still loose. She has a hard time staying hydrated because drink because large volumes of water hurt her stomach. She also reports fairly severe headaches x1-2 per week for which she uses oxycodone. Lastly, she reports that her appetite has been low over the past several months and that she has lost 10-12 lbs over that time. No fevers or chills.     Objective    Vitals:    12/22/19 0932   BP: 154/78   BP Site: R Arm   BP Position: Sitting   Pulse: 74   Temp: 36.6 ??C (97.8 ??F)   TempSrc: Tympanic   Weight: 56.2 kg (123 lb 14.4 oz)   Height: 157.5 cm (5' 2.01)      Body mass index is 22.66 kg/m??.     Physical Exam:    General Appearance:   No acute distress  Lungs:                Clear to auscultation bilaterally  Heart:                           Regular rate and rhythm  Abdomen:                Soft, non-tender, non-distended. Her large mercedes incision from her liver transplant has healed well.   Extremities:              Warm and well perfused        Data Review:  All lab results last 24 hours:    Recent Results (from the past 24 hour(s))   Gamma GT    Collection Time: 12/22/19  9:25 AM   Result Value Ref Range    GGT 80 (H) 0 - 38 U/L   Magnesium Level    Collection Time: 12/22/19  9:25 AM   Result Value Ref Range    Magnesium  1.2 (L) 1.6 - 2.6 mg/dL   Phosphorus Level    Collection Time: 12/22/19  9:25 AM   Result Value Ref Range    Phosphorus 3.2 2.4 - 5.1 mg/dL   Bilirubin, Direct    Collection Time: 12/22/19  9:25 AM   Result Value Ref Range    Bilirubin, Direct 0.50 (H) 0.00 - 0.30 mg/dL   Comprehensive Metabolic Panel    Collection Time: 12/22/19  9:25 AM   Result Value Ref Range    Sodium 141 135 - 145 mmol/L    Potassium 3.3 (L) 3.4 - 4.5 mmol/L    Chloride 106 98 - 107 mmol/L    Anion Gap 10 5 - 14 mmol/L    CO2 25.0 20.0 - 31.0 mmol/L    BUN 11 9 - 23 mg/dL    Creatinine 1.61 (H) 0.55 - 1.02 mg/dL    BUN/Creatinine Ratio 9     EGFR CKD-EPI Non-African American, Female 53 (L) >=60 mL/min/1.72m2    EGFR CKD-EPI African American, Female 64 >=60 mL/min/1.30m2    Glucose 90 70 - 179 mg/dL    Calcium 8.8 8.7 - 09.6 mg/dL    Albumin 3.4 3.4 - 5.0 g/dL    Total Protein 6.0 5.7 - 8.2 g/dL    Total Bilirubin 0.9 0.3 - 1.2 mg/dL    AST 16 <=04 U/L    ALT 11 10 - 49 U/L    Alkaline Phosphatase 176 (H) 46 - 116 U/L   CBC w/ Differential    Collection Time: 12/22/19  9:25 AM   Result Value Ref Range    WBC 2.6 (L) 4.5 - 11.0 10*9/L    RBC 2.39 (L) 4.00 - 5.20 10*12/L    HGB 8.3 (L) 12.0 - 16.0 g/dL    HCT 54.0 (L) 98.1 - 46.0 %    MCV 95.2 80.0 - 100.0 fL    MCH 34.9 (H) 26.0 - 34.0 pg    MCHC 36.7 31.0 - 37.0 g/dL    RDW 19.1 (H) 47.8 - 15.0 %    MPV 8.2 7.0 - 10.0 fL    Platelet 123 (L) 150 - 440 10*9/L    Variable HGB Concentration Slight (A) Not Present    Neutrophils % 63.0 %    Lymphocytes % 26.3 %    Monocytes % 5.1 %    Eosinophils % 3.5 %    Basophils % 0.6 %    Absolute Neutrophils 1.6 (L) 2.0 - 7.5 10*9/L    Absolute Lymphocytes 0.7 (L) 1.5 - 5.0 10*9/L    Absolute Monocytes 0.1 (L) 0.2 - 0.8 10*9/L    Absolute Eosinophils 0.1 0.0 - 0.4 10*9/L    Absolute Basophils 0.0 0.0 - 0.1 10*9/L    Large Unstained Cells 2 0 - 4 %    Macrocytosis Slight (A) Not Present    Anisocytosis Slight (A) Not Present    Hyperchromasia Moderate (A) Not Present   Amylase    Collection Time: 12/22/19  9:25 AM   Result Value Ref Range    Amylase 35 30 - 118 U/L   Lipase    Collection Time: 12/22/19  9:25 AM   Result Value Ref Range    Lipase 34 12 - 53 U/L       Park Breed, MS3

## 2019-12-22 NOTE — Telephone Encounter (Signed)
LVM regarding missed appointment today. Noted when her next scheduled visit is and the time. If she cannot make that appointment to call and let us know. Noted some open slots today if she had available time to call back and let us know.   Saul Fabiano PT, DPT, LAT, ATC  12/22/19  11:33 AM    .

## 2019-12-22 NOTE — Telephone Encounter (Signed)
Pt returned voicemail noting on her last attended appointment she stated she had conflicting appointment in [place] and that today's appointment needed to be cancelled. She stated she plans to attend her next scheduled appointment.  Valaria Kohut PT, DPT, LAT, ATC  12/22/19  11:38 AM

## 2019-12-23 DIAGNOSIS — Z944 Liver transplant status: Principal | ICD-10-CM

## 2019-12-23 DIAGNOSIS — R410 Disorientation, unspecified: Principal | ICD-10-CM

## 2019-12-23 DIAGNOSIS — G4709 Other insomnia: Principal | ICD-10-CM

## 2019-12-23 MED ORDER — MIRTAZAPINE 7.5 MG TABLET
ORAL_TABLET | Freq: Every evening | ORAL | 6 refills | 30 days | Status: CP
Start: 2019-12-23 — End: 2020-01-22

## 2019-12-23 MED FILL — MIRTAZAPINE 7.5 MG TABLET: 7.5 | 30 days supply | Qty: 30 | Fill #0

## 2019-12-23 NOTE — Unmapped (Addendum)
Cecilie Lowers, PharmD sent message to Dr. Thornell Sartorius with psychiatry asking if patient could transition from Zyprexa to Remeron to help with appetite stimulant. Dr. Polo Riley was fine with stopping Zyprexa and starting Remeron 7.5mg  daily at bedtime if patient has been stable. Vernona Rieger confirmed patient is stable and well.   Patient has a follow up visit on 01/24/20 but said he will follow up with patient in 2 weeks after this change.     Talked with patient about the above that Dr. Polo Riley was fine with patient stopping zyprexa and starting remeron 7.5mg  at bedtime. Mentioned we will send script to [facility] and she will hopefully receive it by Monday to either start Monday/Tuesday. Verbalized understanding that if she has questions/issues with this medication to follow up with Dr. Polo Riley who mentioned he will follow up with her 2 weeks after this transition. Mentioned Dr. Jolene Provost is managing this medication for her and if refills/changes are needed to reach out to psychiatry.     Sent message back to Vernona Rieger, Dr. Polo Riley and Dr. Breck Coons (who Dr. Polo Riley included as attending provider) that patient is aware to stop zyprexa, start remeron 7.5mg  at bedtime that will start on Monday/Tuesday when pharmacy has, and that Dr. Polo Riley will follow up in 2 weeks; mentioned patient is aware she can contact Dr. Jolene Provost with any issues/questions that may arise related to the transition.     Of note: Yesterday (12/22/19) Cecilie Lowers, PharmD saw the [facility] tac level result at 6.5 after [facility] results have been consistently in the 8-9 range. Per Vernona Rieger, no change to pt's tacrolimus and to keep her [facility] tac levels around 9 (due to lab variability with [facility] tac levels resulting higher). Discussed this with patient letting her know that we did see the [facility] tac level of 6.5 and we were making no changes and that [facility]'s results are typically higher as researched in the past and we will keep her in the 9 range with [facility] labs.

## 2019-12-24 NOTE — Unmapped (Signed)
Patient seen in clinic with Dr. Matilde Haymaker and Cecilie Lowers, PharmD. Denies vomiting and fevers. Diarrhea 1-2x a day with imodium use. Nausea and feels this is related to indigestion/heartburn and Vernona Rieger is increasing her omeprazole to twice a day.   Talked with both Dr. Matilde Haymaker and Vernona Rieger about pt's down trending WBC and ANC and both mentioned no interventions and continue to monitor.   Mentioned to patient that we messaged her on Tuesday, October 26 in MyChart about her magnesium level of 1.3 to see if she had missed any magnesium doses and that pharmacist noted the magnesium 1.2 from today's labs. Patient mentioned she had missed doses of magnesium and was not taking everyday trying to reduce the amount of medications in her system (was taking the other medications). Mentioned that with her mag levels decreasing to 1.2 that her body is showing that she needs the added magnesium and she agreed and said she would continue to take the magnesium.   Reports that tremors are better; headaches have improved and now 2x a week. Dr. Matilde Haymaker talked with patient about Envarsus will be sent to her pharmacy to see if insurance will cover to see if she can transition to the once a day Envarsus that at times can help with the headaches.   Dr. Matilde Haymaker encouraged her to continue to eat throughout day and that the taste of food will return.   Takes tacrolimus at 8:30am/pm.   Vernona Rieger mentioned that patient wanted to take pentamidine for 1 more month in November and then will transition to Bactrim. Vernona Rieger mentioned to move pt's 3 month appointments from Nov 22 to Thursday, Nov 18. Sent message to [facility] to move these appts and to include nurse visit for labs to be drawn, appt with Vernona Rieger and Gertie Fey, NP and then nurse visit for pentamidine in the afternoon. (of note: 3 month KUB is not needed since she had ERCP with stent removal on 12/07/19 and stent placed on 10/14 ERCP while inpatient).  Patient aware these appointments hopefully will be moved and aware the pentamidine will need to be in the afternoon that day. Mentioned she can see the appt changes in MyChart.

## 2019-12-26 ENCOUNTER — Other Ambulatory Visit: Payer: Self-pay | Admitting: *Deleted

## 2019-12-26 DIAGNOSIS — Z79899 Other long term (current) drug therapy: Principal | ICD-10-CM

## 2019-12-26 DIAGNOSIS — Z944 Liver transplant status: Principal | ICD-10-CM

## 2019-12-26 DIAGNOSIS — E785 Hyperlipidemia, unspecified: Secondary | ICD-10-CM

## 2019-12-26 LAB — CBC W/ DIFFERENTIAL
BASOPHILS ABSOLUTE COUNT: 0 10*3/uL (ref 0.0–0.2)
BASOPHILS RELATIVE PERCENT: 1 %
EOSINOPHILS ABSOLUTE COUNT: 0.1 10*3/uL (ref 0.0–0.4)
EOSINOPHILS RELATIVE PERCENT: 4 %
HEMATOCRIT: 25.4 % — ABNORMAL LOW (ref 34.0–46.6)
HEMOGLOBIN: 8.4 g/dL — ABNORMAL LOW (ref 11.1–15.9)
IMMATURE GRANULOCYTES: 2 %
LYMPHOCYTES ABSOLUTE COUNT: 0.9 10*3/uL (ref 0.7–3.1)
LYMPHOCYTES RELATIVE PERCENT: 30 %
MEAN CORPUSCULAR HEMOGLOBIN CONC: 33.1 g/dL (ref 31.5–35.7)
MEAN CORPUSCULAR HEMOGLOBIN: 32.2 pg (ref 26.6–33.0)
MEAN CORPUSCULAR VOLUME: 97 fL (ref 79–97)
MONOCYTES RELATIVE PERCENT: 11 %
NEUTROPHILS RELATIVE PERCENT: 52 %
PLATELET COUNT: 105 10*3/uL — ABNORMAL LOW (ref 150–450)
RED BLOOD CELL COUNT: 2.61 x10E6/uL — CL (ref 3.77–5.28)
RED CELL DISTRIBUTION WIDTH: 15.9 % — ABNORMAL HIGH (ref 11.7–15.4)
WHITE BLOOD CELL COUNT: 2.8 10*3/uL — ABNORMAL LOW (ref 3.4–10.8)

## 2019-12-26 LAB — LYMPHOCYTES RELATIVE PERCENT: Lymphocytes/100 leukocytes:NFr:Pt:Bld:Qn:Automated count: 30

## 2019-12-26 MED ORDER — CARVEDILOL 25 MG TABLET
ORAL_TABLET | Freq: Two times a day (BID) | ORAL | 11 refills | 30.00000 days | Status: CP
Start: 2019-12-26 — End: 2020-12-25

## 2019-12-26 MED ORDER — OMEPRAZOLE 40 MG CAPSULE,DELAYED RELEASE
ORAL_CAPSULE | Freq: Two times a day (BID) | ORAL | 5 refills | 30.00000 days | Status: CP
Start: 2019-12-26 — End: 2020-12-25

## 2019-12-26 MED FILL — CARVEDILOL 25 MG TABLET: 25 | 30 days supply | Qty: 90 | Fill #0

## 2019-12-26 MED FILL — OMEPRAZOLE 40 MG CPDR: 40 | 30 days supply | Qty: 60 | Fill #0

## 2019-12-27 ENCOUNTER — Encounter: Payer: Self-pay | Admitting: Physical Therapy

## 2019-12-27 ENCOUNTER — Ambulatory Visit: Payer: 59 | Attending: Family Medicine | Admitting: Physical Therapy

## 2019-12-27 ENCOUNTER — Other Ambulatory Visit: Payer: Self-pay

## 2019-12-27 DIAGNOSIS — Z944 Liver transplant status: Principal | ICD-10-CM

## 2019-12-27 DIAGNOSIS — Z79899 Other long term (current) drug therapy: Principal | ICD-10-CM

## 2019-12-27 DIAGNOSIS — R2689 Other abnormalities of gait and mobility: Secondary | ICD-10-CM | POA: Insufficient documentation

## 2019-12-27 DIAGNOSIS — R293 Abnormal posture: Secondary | ICD-10-CM | POA: Diagnosis not present

## 2019-12-27 DIAGNOSIS — Z951 Presence of aortocoronary bypass graft: Secondary | ICD-10-CM | POA: Insufficient documentation

## 2019-12-27 DIAGNOSIS — R2681 Unsteadiness on feet: Secondary | ICD-10-CM | POA: Diagnosis not present

## 2019-12-27 DIAGNOSIS — M6281 Muscle weakness (generalized): Secondary | ICD-10-CM | POA: Diagnosis not present

## 2019-12-27 DIAGNOSIS — M62830 Muscle spasm of back: Secondary | ICD-10-CM | POA: Insufficient documentation

## 2019-12-27 LAB — MAGNESIUM: Magnesium:MCnc:Pt:Ser/Plas:Qn:: 1.4 — ABNORMAL LOW

## 2019-12-27 LAB — BILIRUBIN DIRECT: Bilirubin.glucuronidated+Bilirubin.albumin bound:MCnc:Pt:Ser/Plas:Qn:: 0.32

## 2019-12-27 LAB — COMPREHENSIVE METABOLIC PANEL
A/G RATIO: 1.9 (ref 1.2–2.2)
ALBUMIN: 3.9 g/dL (ref 3.8–4.8)
ALKALINE PHOSPHATASE: 145 IU/L — ABNORMAL HIGH (ref 44–121)
ALT (SGPT): 10 IU/L (ref 0–32)
AST (SGOT): 12 IU/L (ref 0–40)
BILIRUBIN TOTAL: 0.7 mg/dL (ref 0.0–1.2)
BUN / CREAT RATIO: 8 — ABNORMAL LOW (ref 9–23)
CALCIUM: 9 mg/dL (ref 8.7–10.2)
CHLORIDE: 107 mmol/L — ABNORMAL HIGH (ref 96–106)
CO2: 23 mmol/L (ref 20–29)
CREATININE: 1.37 mg/dL — ABNORMAL HIGH (ref 0.57–1.00)
GLOBULIN, TOTAL: 2.1 g/dL (ref 1.5–4.5)
POTASSIUM: 3.7 mmol/L (ref 3.5–5.2)
SODIUM: 144 mmol/L (ref 134–144)
TOTAL PROTEIN: 6 g/dL (ref 6.0–8.5)

## 2019-12-27 LAB — CO2: Carbon dioxide:SCnc:Pt:Ser/Plas:Qn:: 23

## 2019-12-27 LAB — GAMMA GLUTAMYL TRANSFERASE: Gamma glutamyl transferase:CCnc:Pt:Ser/Plas:Qn:: 64 — ABNORMAL HIGH

## 2019-12-27 LAB — PHOSPHORUS, SERUM: Phosphate:MCnc:Pt:Ser/Plas:Qn:: 3.6

## 2019-12-27 MED ORDER — TACROLIMUS XR 1 MG TABLET,EXTENDED RELEASE 24 HR
ORAL_TABLET | Freq: Every day | ORAL | 11 refills | 60 days | Status: CN
Start: 2019-12-27 — End: ?

## 2019-12-27 NOTE — Unmapped (Addendum)
Patient sent MyChart message asking for clarification of Envarsus dosing as she is currently taking 4mg  BID - she notes envarsus bottles indicate she should take a total of 10mg  once daily.  Sent message to PharmD Chargualaf to clarify - she confirmed dosing conversion from 4mg  BID should be 5.mg once daily - returned call to patient to relay this, she verbalized understanding.  It should be noted 11/1 tac level is still pending and will NOT reflect transition to envarsus.

## 2019-12-27 NOTE — Unmapped (Signed)
Assessment:   ??  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    She's subjectively improving and feeling better. No fevers. Few soft bowel movements.  Her issue now are elevated cholestatic enzymes.  ??  ??  Plan:   ??  1. Liver US stat today  2. Will follow-up labs  3. If no improvement - will proceed with ERCP, discussed with liver team  4. Will see IS level today    Physical Exam:  ??  General: uncomfortable-appearing, but in no acute distress  Neuro: alert and oriented, conversational and appropriate  CV: regular rate  Pulm: unlabored breathing on room air  Abd: soft, non-tender. Mercedes incision well-healed.

## 2019-12-27 NOTE — Therapy (Signed)
[place], [place], [address] Phone: [phone]   Fax:  [phone]  Physical Therapy Treatment  Patient Details  Name: Christine Butler MRN: [id number] Date of Birth: 23-Jul-1970 Referring Provider (PT): Benna Dunks, NP    Encounter Date: 12/27/2019   PT End of Session - 12/27/19 1138    Visit Number 5    Number of Visits 17    Date for PT Re-Evaluation 01/19/20    Authorization Type [facility] U[facility]    PT Start Time 1100    PT Stop Time 1143    PT Time Calculation (min) 43 min    Activity Tolerance Other (comment)   Pt had bout of nausea   Behavior During Therapy [facility] for tasks assessed/performed           Past Medical History:  Diagnosis Date  . (HFpEF) heart failure with preserved ejection fraction ([place]) 08/09/2019  . Acute blood loss anemia 04/20/2017  . Altered mental status 08/14/2019  . Arthritis    right knee  . Ascites--mild this admit 11/06/2017   per patient , now stable with meds and lifestlye adjustment  . Atypical chest pain 11/05/2017  . Atypical squamous cells of undetermined significance (ASCUS) on Papanicolaou smear of cervix 08/05/2018  . CAD (coronary artery disease)    a. s/p CABGx2 in 02/2017 (LAD not suitable for PCI), EF normal.  . CAD (coronary artery disease), native coronary artery 03/23/2017  . 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 Cirrhosis ([place]) 03/16/2017  . Coronary artery disease 03/24/2017  . Dyslipidemia, goal LDL below 70 10/13/2017  . Elevated LFTs 04/20/2017  . Encephalopathy 09/29/2019  . Familial hyperlipidemia   . GERD (gastroesophageal reflux disease)   . GI bleed 02/01/2018  . H/O LEEP 03/30/2019  . H/O two vessel coronary artery bypass graft 03/30/2019  . High grade squamous intraepithelial lesion (HGSIL) on cytologic smear of cervix 05/04/2019  . Hypo-osmolality and hyponatremia 03/30/2019  . IBS (irritable bowel syndrome)   . Other insomnia  09/05/2019  . Pancytopenia ([place]) 09/28/2019  . PONV (postoperative nausea and vomiting)    i always throw up on waking up , but last EGD in march had no issues    . Port-A-Cath in place 11/21/2019  . Primary biliary cholangitis ([place]) 07/26/2012   Formatting of this note might be different from the original. IMOUPDATE  . Primary biliary cirrhosis (H[facility])    cirhosis/liver disease followed by transplant team led by Dr Manuella Ghazi at [facility]   . S/P CABG (coronary artery bypass graft)   . S/P CABG x 2 03/24/2017   LIMA to DIAGONAL Portion of SVG/LEFT RADIAL to LAD  . S/P LEEP (loop electrosurgical excision procedure) 05/11/2019  . Status post liver transplantation ([place]) 03/30/2019  . SVD (spontaneous vaginal delivery)    x 3  . Upper GI bleed 02/01/2018  . Urinary tract bacterial infections     Past Surgical History:  Procedure Laterality Date  . CORONARY ARTERY BYPASS GRAFT N/A 03/24/2017   Procedure: CORONARY ARTERY BYPASS GRAFTING (CABG) x two, using left internal mammary artery, left radial artery, and right leg greater saphenous vein harvested endoscopically;  Surgeon: Ivin Poot, MD;  Location: [place];  Service: Open Heart Surgery;  Laterality: N/A;  . ENDOVEIN HARVEST OF GREATER SAPHENOUS VEIN Right 03/24/2017   Procedure: ENDOVEIN HARVEST OF GREATER SAPHENOUS VEIN;  Surgeon: Ivin Poot, MD;  Location: [place];  Service: Open Heart Surgery;  Laterality: Right;  . ESOPHAGEAL BANDING  02/01/2018   Procedure: ESOPHAGEAL BANDING;  Surgeon: [name], MD;  Location: [facility];  Service: Gastroenterology;;  . ESOPHAGEAL BANDING N/A 03/29/2018   Procedure: ESOPHAGEAL BANDING;  Surgeon: [name], MD;  Location: [facility];  Service: Gastroenterology;  Laterality: N/A;  . ESOPHAGEAL BANDING N/A 05/21/2018   Procedure: ESOPHAGEAL BANDING;  Surgeon: [name], MD;  Location: [facility];  Service: Gastroenterology;  Laterality: N/A;  . ESOPHAGEAL BANDING N/A 10/11/2018   Procedure:  ESOPHAGEAL BANDING;  Surgeon: [name], MD;  Location: [facility];  Service: Gastroenterology;  Laterality: N/A;  . ESOPHAGOGASTRODUODENOSCOPY N/A 03/29/2018   Procedure: ESOPHAGOGASTRODUODENOSCOPY (EGD);  Surgeon: [name], MD;  Location: [facility];  Service: Gastroenterology;  Laterality: N/A;  . ESOPHAGOGASTRODUODENOSCOPY N/A 05/21/2018   Procedure: ESOPHAGOGASTRODUODENOSCOPY (EGD);  Surgeon: [name], MD;  Location: [facility];  Service: Gastroenterology;  Laterality: N/A;  . ESOPHAGOGASTRODUODENOSCOPY (EGD) WITH PROPOFOL N/A 02/01/2018   Procedure: ESOPHAGOGASTRODUODENOSCOPY (EGD) WITH PROPOFOL;  Surgeon: [name], MD;  Location: [facility];  Service: Gastroenterology;  Laterality: N/A;  . ESOPHAGOGASTRODUODENOSCOPY (EGD) WITH PROPOFOL N/A 10/11/2018   Procedure: ESOPHAGOGASTRODUODENOSCOPY (EGD) WITH PROPOFOL;  Surgeon: [name], MD;  Location: [facility];  Service: Gastroenterology;  Laterality: N/A;  . INCONTINENCE SURGERY    . IR IMAGING GUIDED PORT INSERTION  04/08/2019  . LEEP N/A 03/28/2014   Procedure: LOOP ELECTROSURGICAL EXCISION PROCEDURE (LEEP) cone biopsy;  Surgeon: Cyril Mourning, MD;  Location: [facility];  Service: Gynecology;  Laterality: N/A;  . LEFT HEART CATH AND CORONARY ANGIOGRAPHY N/A 03/23/2017   Procedure: LEFT HEART CATH AND CORONARY ANGIOGRAPHY;  Surgeon: Jettie Booze, MD;  Location: [facility];  Service: Cardiovascular;  Laterality: N/A;  . LIVER BIOPSY     x 2  . RADIAL ARTERY HARVEST Left 03/24/2017   Procedure: RADIAL ARTERY HARVEST;  Surgeon: Ivin Poot, MD;  Location: [facility];  Service: Open Heart Surgery;  Laterality: Left;  . TEE WITHOUT CARDIOVERSION N/A 03/24/2017   Procedure: TRANSESOPHAGEAL ECHOCARDIOGRAM (TEE);  Surgeon: Prescott Gum, Collier Salina, MD;  Location: [facility];  Service: Open Heart Surgery;  Laterality: N/A;  . WISDOM TOOTH EXTRACTION      There were no vitals filed for this visit.   Subjective Assessment -  12/27/19 1102    Subjective Pt reports feeling better, Walking longer distances, trying to get her breathing back. Pt reported she plans to return to work in Jan 2022    Pertinent History CAD, liver tranplants    Limitations Standing;Walking;Sitting    How long can you sit comfortably? 1 hour    How long can you stand comfortably? 15 min    How long can you walk comfortably? 15 min    Patient Stated Goals get back to walking a mile, increase stamina, promote strength and return work.    Pain Score 0-No pain   feels tension   Pain Type Chronic pain    Pain Onset More than a month ago    Aggravating Factors  walking longer distances, standing for long time,    Pain Relieving Factors stretches, popping my back, heat                             [facility] Adult PT Treatment/Exercise - 12/27/19 0001      Lumbar Exercises: Supine   Other Supine Lumbar Exercises Transverse abdominis activation practice, 10x30s   VC and TC from PT  and pt.    Other Supine Lumbar Exercises Clamshell with red theraband 2x15      Knee/Hip Exercises: Stretches   Piriformis Stretch Both;4 reps;30 seconds      Knee/Hip Exercises: Aerobic   Nustep L5 x 6 min UE & LE both       Knee/Hip Exercises: Standing   Forward Step Up --    Forward Step Up Limitations --    Step Down --    Step Down Limitations --      Knee/Hip Exercises: Seated   Abduction/Adduction  AROM;Both;1 set;15 reps   Ball squeeze   Sit to [employer] 2 sets;10 reps;without UE support   red therband around knees, VC for leaning forward     Knee/Hip Exercises: Supine   Short Arc Quad Sets AROM;Strengthening;2 sets;20 reps   Ball adductor squeeze, knees bent, activation of TrA   Bridges AROM;2 sets;Strengthening;10 reps   pause at top for 1s   Bridges Limitations Limited to small range of motion by tight clothing                  PT Education - 12/27/19 1109    Education Details Firing of Transverse Abdominis to activate core  for stability    Person(s) Educated Patient    Methods Explanation;Demonstration;Tactile cues;Verbal cues    Comprehension Verbalized understanding;Returned demonstration            PT Short Term Goals - 11/24/19 1426      PT SHORT TERM GOAL #1   Title pt to be IND with initial HEP    Time 4    Period Weeks    Status New    Target Date 12/22/19      PT SHORT TERM GOAL #2   Title increase bil hip abductor / extensor strength to >/ 3-/5 to promote hip stability    Time 4    Period Weeks    Status New    Target Date 12/22/19      PT SHORT TERM GOAL #3   Title increase 5 x sit to stand  and TUG to </= 13 seconds to work toward functional / safe transitions    Time 4    Period Weeks    Status New    Target Date 12/22/19             PT Long Term Goals - 11/24/19 1428      PT LONG TERM GOAL #1   Title increase bil LE gross strength to 4+/5 to promtoe hip/ knee stability and promote efficient posture    Time 8    Period Weeks    Status New    Target Date 01/19/20      PT LONG TERM GOAL #2   Title increase bil Gross UE strength to >/= 4+/5 to assist with functional lifting/ carrying / pushing and pull as it relates to potential work related tasks.    Time 8    Period Weeks    Status New    Target Date 01/19/20      PT LONG TERM GOAL #3   Title improve 5 x STS to </= 12 seconds and TUG to </= 10 seconds to for functiona/ efficent and safe mobility    Time 8    Period Weeks    Status New    Target Date 01/19/20      PT LONG TERM GOAL #4   Title pt to be able to stand and walk for >/=  1 hour for functional endurnace required for work tasks and community ambulation with no report of pain    Time 8    Period Weeks    Status New    Target Date 01/19/20      PT LONG TERM GOAL #5   Title increase FOTO score to </=44% limited to demo improvement in function    Time 8    Period Weeks    Status New    Target Date 01/19/20      Additional Long Term Goals    Additional Long Term Goals Yes      PT LONG TERM GOAL #6   Title pt to be IND with all HEP to maintain and progress current LOF IND    Time 8    Period Weeks    Status New    Target Date 01/19/20                 Plan - 12/27/19 1139    Clinical Impression Statement Pt reported simply feeling sore after last visit so exercises were progressed including sit-to-stand and short-arc quad. Pt was educated on activation of transverse abdominis with verbal and tactile cues and applied activation to partial range bridges. Pt did have a bout of nausea at the end of the session that resolved on its own but bears monitoring for future sessions, but otherwise tolerated all treatments well with no increased pain report. Treatment session terminated early due to patient needing to take phone call from [facility] where they had their transplant surgery.    Personal Factors and Comorbidities Comorbidity 1    Comorbidities hx of liver transplant, anemia, significant PMHx/ surgical hx    Stability/Clinical Decision Making Evolving/Moderate complexity    Rehab Potential Good    PT Frequency 2x / week    PT Duration 8 weeks    PT Treatment/Interventions ADLs/Self Care Home Management;Moist Heat;Cryotherapy;Gait training;Stair training;Functional mobility training;Therapeutic activities;Therapeutic exercise;Balance training;Neuromuscular re-education;Manual techniques;Passive range of motion;Taping;Patient/family education    PT Next Visit Plan FOTO at 6th visit, utilize stair boxes for stair training (esp descent), work on endurance and strengthening of lower quarter    PT Home Exercise Plan [phone] - low back stretch (seated walking hands on chair), sit to stand with hands on knees, lower trunk rotations, sidelying hip abduction, gripping, finger extnesion with putty/ rubber band.    Consulted and Agree with Plan of Care Patient           Patient will benefit from skilled therapeutic intervention in  order to improve the following deficits and impairments:  Abnormal gait, Improper body mechanics, Postural dysfunction, Increased muscle spasms, Decreased strength, Pain, Decreased activity tolerance, Decreased balance, Decreased endurance  Visit Diagnosis: Muscle weakness (generalized)  Other abnormalities of gait and mobility  Unsteadiness on feet     Problem List Patient Active Problem List   Diagnosis Date Noted  . S/P CABG (coronary artery bypass graft)   . Port-A-Cath in place 11/21/2019  . Encephalopathy 09/29/2019  . Pancytopenia ([place]) 09/28/2019  . Arthritis 09/21/2019  . PONV (postoperative nausea and vomiting) 09/21/2019  . SVD (spontaneous vaginal delivery) 09/21/2019  . Urinary tract bacterial infections 09/21/2019  . CAD (coronary artery disease)   . Other insomnia 09/05/2019  . 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 Altered mental status 08/14/2019  . (HFpEF) heart failure with preserved ejection fraction ([place]) 08/09/2019  . S/P LEEP (loop electrosurgical excision procedure) 05/11/2019  . High grade squamous intraepithelial lesion (HGSIL) on cytologic smear of  cervix 05/04/2019  . Familial hyperlipidemia 03/30/2019  . H/O two vessel coronary artery bypass graft 03/30/2019  . Status post liver transplantation ([place]) 03/30/2019  . Hypo-osmolality and hyponatremia 03/30/2019  . Atypical squamous cells of undetermined significance (ASCUS) on Papanicolaou smear of cervix 08/05/2018  . Upper GI bleed 02/01/2018  . GI bleed 02/01/2018  . Ascites--mild this admit 11/06/2017  . Atypical chest pain 11/05/2017  . Dyslipidemia, goal LDL below 70 10/13/2017  . Acute blood loss anemia 04/20/2017  . Elevated LFTs 04/20/2017  . Coronary artery disease 03/24/2017  . S/P CABG x 2 03/24/2017  . CAD (coronary artery disease), native coronary artery 03/23/2017  . Cirrhosis ([place]) 03/16/2017  . GERD (gastroesophageal reflux disease) 01/28/2012  . Primary biliary cirrhosis ([place]) 01/28/2012  .  IBS (irritable bowel syndrome) 01/28/2012  . Primary biliary cholangitis ([place]) 01/28/2012    Dawayne Cirri, SPT 12/27/2019, 1:17 PM  [facility] [address] [place], [place], [address] Phone: [phone]   Fax:  [phone]  Name: SHAVANNA FURNARI MRN: [id number] Date of Birth: [birth year]

## 2019-12-28 ENCOUNTER — Ambulatory Visit: Payer: 59 | Admitting: Cardiology

## 2019-12-28 LAB — TACROLIMUS BLOOD: Tacrolimus:MCnc:Pt:Bld:Qn:LC/MS/MS: 10.3

## 2019-12-28 MED ORDER — OMEPRAZOLE 40 MG CAPSULE,DELAYED RELEASE
ORAL_CAPSULE | Freq: Every day | ORAL | 5 refills | 30 days | Status: CN
Start: 2019-12-28 — End: 2020-12-27

## 2019-12-28 MED ORDER — CARVEDILOL 25 MG TABLET
ORAL_TABLET | Freq: Two times a day (BID) | ORAL | 11 refills | 30 days | Status: CN
Start: 2019-12-28 — End: 2020-12-27

## 2019-12-29 ENCOUNTER — Encounter: Payer: Self-pay | Admitting: Physical Therapy

## 2019-12-29 ENCOUNTER — Ambulatory Visit: Payer: 59 | Admitting: Physical Therapy

## 2019-12-29 ENCOUNTER — Other Ambulatory Visit: Payer: Self-pay

## 2019-12-29 DIAGNOSIS — Z944 Liver transplant status: Principal | ICD-10-CM

## 2019-12-29 DIAGNOSIS — Z79899 Other long term (current) drug therapy: Principal | ICD-10-CM

## 2019-12-29 DIAGNOSIS — R2689 Other abnormalities of gait and mobility: Secondary | ICD-10-CM

## 2019-12-29 DIAGNOSIS — M6281 Muscle weakness (generalized): Secondary | ICD-10-CM | POA: Diagnosis not present

## 2019-12-29 DIAGNOSIS — E785 Hyperlipidemia, unspecified: Secondary | ICD-10-CM | POA: Diagnosis not present

## 2019-12-29 DIAGNOSIS — R2681 Unsteadiness on feet: Secondary | ICD-10-CM | POA: Diagnosis not present

## 2019-12-29 DIAGNOSIS — M62830 Muscle spasm of back: Secondary | ICD-10-CM

## 2019-12-29 DIAGNOSIS — R293 Abnormal posture: Secondary | ICD-10-CM | POA: Diagnosis not present

## 2019-12-29 NOTE — Therapy (Signed)
[place], [place], [address] Phone: [phone]   Fax:  [phone]  Physical Therapy Treatment  Patient Details  Name: Christine Butler MRN: [id number] Date of Birth: August 27, 1970 Referring Provider (PT): Lauralee Evener Placido Sou, NP    Encounter Date: 12/29/2019   PT End of Session - 12/29/19 1128    Visit Number 6    Number of Visits 17    Date for PT Re-Evaluation 01/19/20    Authorization Type [facility]    PT Start Time 1106   Pt arrived late   PT Stop Time 1147    PT Time Calculation (min) 41 min    Activity Tolerance Other (comment)   Pt had bout of nausea   Behavior During Therapy [facility] for tasks assessed/performed           Past Medical History:  Diagnosis Date  . (HFpEF) heart failure with preserved ejection fraction ([place]) 08/09/2019  . Acute blood loss anemia 04/20/2017  . Altered mental status 08/14/2019  . Arthritis    right knee  . Ascites--mild this admit 11/06/2017   per patient , now stable with meds and lifestlye adjustment  . Atypical chest pain 11/05/2017  . Atypical squamous cells of undetermined significance (ASCUS) on Papanicolaou smear of cervix 08/05/2018  . CAD (coronary artery disease)    a. s/p CABGx2 in 02/2017 (LAD not suitable for PCI), EF normal.  . CAD (coronary artery disease), native coronary artery 03/23/2017  . 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 Cirrhosis ([place]) 03/16/2017  . Coronary artery disease 03/24/2017  . Dyslipidemia, goal LDL below 70 10/13/2017  . Elevated LFTs 04/20/2017  . Encephalopathy 09/29/2019  . Familial hyperlipidemia   . GERD (gastroesophageal reflux disease)   . GI bleed 02/01/2018  . H/O LEEP 03/30/2019  . H/O two vessel coronary artery bypass graft 03/30/2019  . High grade squamous intraepithelial lesion (HGSIL) on cytologic smear of cervix 05/04/2019  . Hypo-osmolality and hyponatremia 03/30/2019  . IBS (irritable bowel syndrome)   .  Other insomnia 09/05/2019  . Pancytopenia ([place]) 09/28/2019  . PONV (postoperative nausea and vomiting)    i always throw up on waking up , but last EGD in march had no issues    . Port-A-Cath in place 11/21/2019  . Primary biliary cholangitis ([place]) 07/26/2012   Formatting of this note might be different from the original. IMOUPDATE  . Primary biliary cirrhosis ([facility])    cirhosis/liver disease followed by transplant team led by Dr Manuella Ghazi at [facility]   . S/P CABG (coronary artery bypass graft)   . S/P CABG x 2 03/24/2017   LIMA to DIAGONAL Portion of SVG/LEFT RADIAL to LAD  . S/P LEEP (loop electrosurgical excision procedure) 05/11/2019  . Status post liver transplantation ([place]) 03/30/2019  . SVD (spontaneous vaginal delivery)    x 3  . Upper GI bleed 02/01/2018  . Urinary tract bacterial infections     Past Surgical History:  Procedure Laterality Date  . CORONARY ARTERY BYPASS GRAFT N/A 03/24/2017   Procedure: CORONARY ARTERY BYPASS GRAFTING (CABG) x two, using left internal mammary artery, left radial artery, and right leg greater saphenous vein harvested endoscopically;  Surgeon: Ivin Poot, MD;  Location: [facility];  Service: Open Heart Surgery;  Laterality: N/A;  . ENDOVEIN HARVEST OF GREATER SAPHENOUS VEIN Right 03/24/2017   Procedure: ENDOVEIN HARVEST OF GREATER SAPHENOUS VEIN;  Surgeon: Ivin Poot, MD;  Location: [facility];  Service: Open Heart Surgery;  Laterality: Right;  . ESOPHAGEAL BANDING  02/01/2018   Procedure: ESOPHAGEAL BANDING;  Surgeon: [name], MD;  Location: [facility];  Service: Gastroenterology;;  . ESOPHAGEAL BANDING N/A 03/29/2018   Procedure: ESOPHAGEAL BANDING;  Surgeon: [name], MD;  Location: [facility];  Service: Gastroenterology;  Laterality: N/A;  . ESOPHAGEAL BANDING N/A 05/21/2018   Procedure: ESOPHAGEAL BANDING;  Surgeon: [name], MD;  Location: [facility];  Service: Gastroenterology;  Laterality: N/A;  . ESOPHAGEAL BANDING N/A 10/11/2018    Procedure: ESOPHAGEAL BANDING;  Surgeon: [name], MD;  Location: [facility];  Service: Gastroenterology;  Laterality: N/A;  . ESOPHAGOGASTRODUODENOSCOPY N/A 03/29/2018   Procedure: ESOPHAGOGASTRODUODENOSCOPY (EGD);  Surgeon: [name], MD;  Location: [facility];  Service: Gastroenterology;  Laterality: N/A;  . ESOPHAGOGASTRODUODENOSCOPY N/A 05/21/2018   Procedure: ESOPHAGOGASTRODUODENOSCOPY (EGD);  Surgeon: [name], MD;  Location: [facility];  Service: Gastroenterology;  Laterality: N/A;  . ESOPHAGOGASTRODUODENOSCOPY (EGD) WITH PROPOFOL N/A 02/01/2018   Procedure: ESOPHAGOGASTRODUODENOSCOPY (EGD) WITH PROPOFOL;  Surgeon: [name], MD;  Location: [facility];  Service: Gastroenterology;  Laterality: N/A;  . ESOPHAGOGASTRODUODENOSCOPY (EGD) WITH PROPOFOL N/A 10/11/2018   Procedure: ESOPHAGOGASTRODUODENOSCOPY (EGD) WITH PROPOFOL;  Surgeon: [name], MD;  Location: [facility];  Service: Gastroenterology;  Laterality: N/A;  . INCONTINENCE SURGERY    . IR IMAGING GUIDED PORT INSERTION  04/08/2019  . LEEP N/A 03/28/2014   Procedure: LOOP ELECTROSURGICAL EXCISION PROCEDURE (LEEP) cone biopsy;  Surgeon: Cyril Mourning, MD;  Location: [facility];  Service: Gynecology;  Laterality: N/A;  . LEFT HEART CATH AND CORONARY ANGIOGRAPHY N/A 03/23/2017   Procedure: LEFT HEART CATH AND CORONARY ANGIOGRAPHY;  Surgeon: Jettie Booze, MD;  Location: [facility];  Service: Cardiovascular;  Laterality: N/A;  . LIVER BIOPSY     x 2  . RADIAL ARTERY HARVEST Left 03/24/2017   Procedure: RADIAL ARTERY HARVEST;  Surgeon: Ivin Poot, MD;  Location: [facility];  Service: Open Heart Surgery;  Laterality: Left;  . TEE WITHOUT CARDIOVERSION N/A 03/24/2017   Procedure: TRANSESOPHAGEAL ECHOCARDIOGRAM (TEE);  Surgeon: Prescott Gum, Collier Salina, MD;  Location: [facility];  Service: Open Heart Surgery;  Laterality: N/A;  . WISDOM TOOTH EXTRACTION      There were no vitals filed for this visit.   Subjective  Assessment - 12/29/19 1108    Subjective Pt reports feeling sore yesterday and today after therapy 2 days ago, nausea symptoms have resolved. Pt feels less motivated.    Pertinent History CAD, liver tranplants    Limitations Standing;Walking;Sitting    How long can you sit comfortably? 1 hour    How long can you stand comfortably? 15 min    How long can you walk comfortably? 15 min    Patient Stated Goals get back to walking a mile, increase stamina, promote strength and return work.    Currently in Pain? No/denies    Pain Onset More than a month ago              [facility] PT Assessment - 12/29/19 0001      Observation/Other Assessments   Focus on Therapeutic Outcomes (FOTO)  51% limited                         [facility] Adult PT Treatment/Exercise - 12/29/19 0001      Self-Care   Self-Care Other Self-Care Comments   Tennis ball self-massage for low back trigger points and sor  Lumbar Exercises: Supine   Large Ball Abdominal Isometric 20 reps;3 seconds   2x20; Red Swiss ball on abdomen     Knee/Hip Exercises: Stretches   Active Hamstring Stretch Both;2 reps;30 seconds      Knee/Hip Exercises: Aerobic   Nustep L4 x 4 min UE & LE both (UE at 8)      Knee/Hip Exercises: Standing   Hip Abduction AROM;Stengthening;Both;1 set;5 reps;Limitations;Knee bent   2" step hip dips by leg press   Forward Step Up Both;2 sets;10 reps;Hand Hold: 1;Step Height: 2"      Knee/Hip Exercises: Seated   Long Arc Quad Strengthening;Both;15 reps;3 sets   first 2 sets unweighted; 3rd w/2# ankle weights   Long Arc Quad Weight 2 lbs.   ankle weights   Sit to Sand 2 sets;10 reps;without UE support;Other (comment)   Hovering on descent (isometric)     Knee/Hip Exercises: Supine   Bridges AROM;2 sets;Strengthening;10 reps    Bridges Limitations Wedge under shoulders due to recent surgeyr                  PT Education - 12/29/19 1129    Education Details Education on delayed onset  of muscular soreness; self-massage with tennis    Person(s) Educated Patient    Methods Explanation    Comprehension Verbalized understanding            PT Short Term Goals - 11/24/19 1426      PT SHORT TERM GOAL #1   Title pt to be IND with initial HEP    Time 4    Period Weeks    Status New    Target Date 12/22/19      PT SHORT TERM GOAL #2   Title increase bil hip abductor / extensor strength to >/ 3-/5 to promote hip stability    Time 4    Period Weeks    Status New    Target Date 12/22/19      PT SHORT TERM GOAL #3   Title increase 5 x sit to stand  and TUG to </= 13 seconds to work toward functional / safe transitions    Time 4    Period Weeks    Status New    Target Date 12/22/19             PT Long Term Goals - 12/29/19 1206      PT LONG TERM GOAL #1   Title increase bil LE gross strength to 4+/5 to promtoe hip/ knee stability and promote efficient posture    Time 8    Period Weeks    Status New      PT LONG TERM GOAL #2   Title increase bil Gross UE strength to >/= 4+/5 to assist with functional lifting/ carrying / pushing and pull as it relates to potential work related tasks.    Time 8    Period Weeks    Status New      PT LONG TERM GOAL #3   Title improve 5 x STS to </= 12 seconds and TUG to </= 10 seconds to for functiona/ efficent and safe mobility    Time 8    Period Weeks    Status New      PT LONG TERM GOAL #4   Title pt to be able to stand and walk for >/= 1 hour for functional endurnace required for work tasks and community ambulation with no report of pain    Time  8    Period Weeks    Status New      PT LONG TERM GOAL #5   Title increase FOTO score to </=44% limited to demo improvement in function    Baseline 57% limited at initial eval    Time 8    Period Weeks    Status Partially Met      PT LONG TERM GOAL #6   Title pt to be IND with all HEP to maintain and progress current LOF IND    Time 8    Period Weeks    Status New                  Plan - 12/29/19 1129    Clinical Impression Statement Pt responded well to progression of exercises previous visit and that was continued today: STS with hover, long arc quads seated with weights, and abdominal isometrics on ball (supine). Pt responded well to all exercises with minimal verbal and tactile cues and pt continues to make progress and benefits from therapy as demonstrated by FOTO limitation decreasing to 51%.    Personal Factors and Comorbidities Comorbidity 1    Comorbidities hx of liver transplant, anemia, significant PMHx/ surgical hx    Stability/Clinical Decision Making Evolving/Moderate complexity    Rehab Potential Good    PT Frequency 2x / week    PT Duration 8 weeks    PT Treatment/Interventions ADLs/Self Care Home Management;Moist Heat;Cryotherapy;Gait training;Stair training;Functional mobility training;Therapeutic activities;Therapeutic exercise;Balance training;Neuromuscular re-education;Manual techniques;Passive range of motion;Taping;Patient/family education    PT Next Visit Plan update and progress HEP and provide to pt., utilize stair boxes for stair training (esp descent), work on endurance and strengthening of lower quarter    PT Home Exercise Plan [phone] - low back stretch (seated walking hands on chair), sit to stand with hands on knees, lower trunk rotations, sidelying hip abduction, gripping, finger extnesion with putty/ rubber band.    Consulted and Agree with Plan of Care Patient           Patient will benefit from skilled therapeutic intervention in order to improve the following deficits and impairments:  Abnormal gait, Improper body mechanics, Postural dysfunction, Increased muscle spasms, Decreased strength, Pain, Decreased activity tolerance, Decreased balance, Decreased endurance  Visit Diagnosis: Muscle weakness (generalized)  Other abnormalities of gait and mobility  Unsteadiness on feet  Abnormal posture  Muscle  spasm of back     Problem List Patient Active Problem List   Diagnosis Date Noted  . S/P CABG (coronary artery bypass graft)   . Port-A-Cath in place 11/21/2019  . Encephalopathy 09/29/2019  . Pancytopenia ([place]) 09/28/2019  . Arthritis 09/21/2019  . PONV (postoperative nausea and vomiting) 09/21/2019  . SVD (spontaneous vaginal delivery) 09/21/2019  . Urinary tract bacterial infections 09/21/2019  . CAD (coronary artery disease)   . Other insomnia 09/05/2019  . 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 Altered mental status 08/14/2019  . (HFpEF) heart failure with preserved ejection fraction ([place]) 08/09/2019  . S/P LEEP (loop electrosurgical excision procedure) 05/11/2019  . High grade squamous intraepithelial lesion (HGSIL) on cytologic smear of cervix 05/04/2019  . Familial hyperlipidemia 03/30/2019  . H/O two vessel coronary artery bypass graft 03/30/2019  . Status post liver transplantation ([place]) 03/30/2019  . Hypo-osmolality and hyponatremia 03/30/2019  . Atypical squamous cells of undetermined significance (ASCUS) on Papanicolaou smear of cervix 08/05/2018  . Upper GI bleed 02/01/2018  . GI bleed 02/01/2018  . Ascites--mild this admit 11/06/2017  . Atypical chest  pain 11/05/2017  . Dyslipidemia, goal LDL below 70 10/13/2017  . Acute blood loss anemia 04/20/2017  . Elevated LFTs 04/20/2017  . Coronary artery disease 03/24/2017  . S/P CABG x 2 03/24/2017  . CAD (coronary artery disease), native coronary artery 03/23/2017  . Cirrhosis ([place]) 03/16/2017  . GERD (gastroesophageal reflux disease) 01/28/2012  . Primary biliary cirrhosis ([place]) 01/28/2012  . IBS (irritable bowel syndrome) 01/28/2012  . Primary biliary cholangitis ([place]) 01/28/2012    Dawayne Cirri, SPT 12/29/2019, 12:06 PM  [facility] [address] [place], [place], [address] Phone: [phone]   Fax:  [phone]  Name: MARSHAE AZAM MRN: [id number] Date of Birth:  Jul 08, 1970

## 2019-12-30 LAB — COMPREHENSIVE METABOLIC PANEL
A/G RATIO: 1.8 (ref 1.2–2.2)
ALT (SGPT): 8 IU/L (ref 0–32)
AST (SGOT): 12 IU/L (ref 0–40)
BILIRUBIN TOTAL: 0.6 mg/dL (ref 0.0–1.2)
BLOOD UREA NITROGEN: 11 mg/dL (ref 6–24)
BUN / CREAT RATIO: 9 (ref 9–23)
CALCIUM: 8.6 mg/dL — ABNORMAL LOW (ref 8.7–10.2)
CHLORIDE: 107 mmol/L — ABNORMAL HIGH (ref 96–106)
CO2: 24 mmol/L (ref 20–29)
CREATININE: 1.23 mg/dL — ABNORMAL HIGH (ref 0.57–1.00)
GLOBULIN, TOTAL: 2.1 g/dL (ref 1.5–4.5)
GLUCOSE: 90 mg/dL (ref 65–99)
POTASSIUM: 4.2 mmol/L (ref 3.5–5.2)
SODIUM: 142 mmol/L (ref 134–144)

## 2019-12-30 LAB — CBC W/ DIFFERENTIAL
BANDED NEUTROPHILS ABSOLUTE COUNT: 0 10*3/uL (ref 0.0–0.1)
BASOPHILS ABSOLUTE COUNT: 0 10*3/uL (ref 0.0–0.2)
EOSINOPHILS ABSOLUTE COUNT: 0.1 10*3/uL (ref 0.0–0.4)
EOSINOPHILS RELATIVE PERCENT: 3 %
HEMATOCRIT: 25.8 % — ABNORMAL LOW (ref 34.0–46.6)
HEMOGLOBIN: 8.7 g/dL — ABNORMAL LOW (ref 11.1–15.9)
IMMATURE GRANULOCYTES: 1 %
LYMPHOCYTES ABSOLUTE COUNT: 0.9 10*3/uL (ref 0.7–3.1)
LYMPHOCYTES RELATIVE PERCENT: 28 %
MEAN CORPUSCULAR HEMOGLOBIN CONC: 33.7 g/dL (ref 31.5–35.7)
MEAN CORPUSCULAR HEMOGLOBIN: 32.7 pg (ref 26.6–33.0)
MEAN CORPUSCULAR VOLUME: 97 fL (ref 79–97)
MONOCYTES ABSOLUTE COUNT: 0.4 10*3/uL (ref 0.1–0.9)
NEUTROPHILS ABSOLUTE COUNT: 1.8 10*3/uL (ref 1.4–7.0)
NEUTROPHILS RELATIVE PERCENT: 55 %
PLATELET COUNT: 121 10*3/uL — ABNORMAL LOW (ref 150–450)
RED BLOOD CELL COUNT: 2.66 x10E6/uL — CL (ref 3.77–5.28)
RED CELL DISTRIBUTION WIDTH: 16 % — ABNORMAL HIGH (ref 11.7–15.4)
WHITE BLOOD CELL COUNT: 3.3 10*3/uL — ABNORMAL LOW (ref 3.4–10.8)

## 2019-12-30 LAB — MEAN CORPUSCULAR VOLUME: Erythrocyte mean corpuscular volume:EntVol:Pt:RBC:Qn:Automated count: 97

## 2019-12-30 LAB — BILIRUBIN DIRECT: Bilirubin.glucuronidated+Bilirubin.albumin bound:MCnc:Pt:Ser/Plas:Qn:: 0.28

## 2019-12-30 LAB — PHOSPHORUS, SERUM: Phosphate:MCnc:Pt:Ser/Plas:Qn:: 3.7

## 2019-12-30 LAB — SODIUM: Sodium:SCnc:Pt:Ser/Plas:Qn:: 142

## 2019-12-30 LAB — GAMMA GLUTAMYL TRANSFERASE: Gamma glutamyl transferase:CCnc:Pt:Ser/Plas:Qn:: 54

## 2019-12-30 LAB — MAGNESIUM: Magnesium:MCnc:Pt:Ser/Plas:Qn:: 1.4 — ABNORMAL LOW

## 2019-12-30 LAB — LIPID PANEL
Chol/HDL Ratio: 3.3 ratio (ref 0.0–4.4)
Cholesterol, Total: 137 mg/dL (ref 100–199)
HDL: 41 mg/dL (ref >39)
LDL Chol Calc (NIH): 64 mg/dL (ref 0–99)
Triglycerides: 190 mg/dL — ABNORMAL HIGH (ref 0–149)
VLDL Cholesterol Cal: 32 mg/dL (ref 5–40)

## 2020-01-02 DIAGNOSIS — Z79899 Other long term (current) drug therapy: Principal | ICD-10-CM

## 2020-01-02 DIAGNOSIS — Z944 Liver transplant status: Principal | ICD-10-CM

## 2020-01-03 DIAGNOSIS — Z79899 Other long term (current) drug therapy: Principal | ICD-10-CM

## 2020-01-03 DIAGNOSIS — Z944 Liver transplant status: Principal | ICD-10-CM

## 2020-01-03 LAB — TACROLIMUS BLOOD: Tacrolimus:MCnc:Pt:Bld:Qn:LC/MS/MS: 9.7

## 2020-01-03 LAB — COMPREHENSIVE METABOLIC PANEL
A/G RATIO: 1.9 (ref 1.2–2.2)
ALBUMIN: 3.9 g/dL (ref 3.8–4.8)
ALKALINE PHOSPHATASE: 127 IU/L — ABNORMAL HIGH (ref 44–121)
ALT (SGPT): 8 IU/L (ref 0–32)
AST (SGOT): 15 IU/L (ref 0–40)
BILIRUBIN TOTAL: 0.6 mg/dL (ref 0.0–1.2)
BLOOD UREA NITROGEN: 8 mg/dL (ref 6–24)
BUN / CREAT RATIO: 8 — ABNORMAL LOW (ref 9–23)
CALCIUM: 8.9 mg/dL (ref 8.7–10.2)
CHLORIDE: 105 mmol/L (ref 96–106)
CO2: 22 mmol/L (ref 20–29)
CREATININE: 0.99 mg/dL (ref 0.57–1.00)
GLOBULIN, TOTAL: 2.1 g/dL (ref 1.5–4.5)
GLUCOSE: 94 mg/dL (ref 65–99)
POTASSIUM: 3.9 mmol/L (ref 3.5–5.2)
SODIUM: 142 mmol/L (ref 134–144)
TOTAL PROTEIN: 6 g/dL (ref 6.0–8.5)

## 2020-01-03 LAB — CBC W/ DIFFERENTIAL
BANDED NEUTROPHILS ABSOLUTE COUNT: 0 10*3/uL (ref 0.0–0.1)
BASOPHILS ABSOLUTE COUNT: 0 10*3/uL (ref 0.0–0.2)
BASOPHILS RELATIVE PERCENT: 1 %
EOSINOPHILS ABSOLUTE COUNT: 0 10*3/uL (ref 0.0–0.4)
EOSINOPHILS RELATIVE PERCENT: 2 %
HEMATOCRIT: 25.5 % — ABNORMAL LOW (ref 34.0–46.6)
HEMOGLOBIN: 8.7 g/dL — ABNORMAL LOW (ref 11.1–15.9)
IMMATURE GRANULOCYTES: 1 %
LYMPHOCYTES ABSOLUTE COUNT: 0.6 10*3/uL — ABNORMAL LOW (ref 0.7–3.1)
LYMPHOCYTES RELATIVE PERCENT: 22 %
MEAN CORPUSCULAR HEMOGLOBIN CONC: 34.1 g/dL (ref 31.5–35.7)
MEAN CORPUSCULAR HEMOGLOBIN: 33.1 pg — ABNORMAL HIGH (ref 26.6–33.0)
MEAN CORPUSCULAR VOLUME: 97 fL (ref 79–97)
MONOCYTES ABSOLUTE COUNT: 0.2 10*3/uL (ref 0.1–0.9)
MONOCYTES RELATIVE PERCENT: 8 %
NEUTROPHILS ABSOLUTE COUNT: 1.8 10*3/uL (ref 1.4–7.0)
NEUTROPHILS RELATIVE PERCENT: 66 %
PLATELET COUNT: 125 10*3/uL — ABNORMAL LOW (ref 150–450)
RED BLOOD CELL COUNT: 2.63 x10E6/uL — CL (ref 3.77–5.28)
RED CELL DISTRIBUTION WIDTH: 15.7 % — ABNORMAL HIGH (ref 11.7–15.4)
WHITE BLOOD CELL COUNT: 2.8 10*3/uL — ABNORMAL LOW (ref 3.4–10.8)

## 2020-01-03 LAB — GAMMA GT: GAMMA GLUTAMYL TRANSFERASE: 45 IU/L (ref 0–60)

## 2020-01-03 LAB — BILIRUBIN, DIRECT: BILIRUBIN DIRECT: 0.26 mg/dL (ref 0.00–0.40)

## 2020-01-03 LAB — PHOSPHORUS: PHOSPHORUS, SERUM: 3.7 mg/dL (ref 3.0–4.3)

## 2020-01-03 LAB — MAGNESIUM: MAGNESIUM: 1.5 mg/dL — ABNORMAL LOW (ref 1.6–2.3)

## 2020-01-04 ENCOUNTER — Encounter: Payer: Self-pay | Admitting: Physical Therapy

## 2020-01-04 ENCOUNTER — Other Ambulatory Visit: Payer: Self-pay

## 2020-01-04 ENCOUNTER — Ambulatory Visit: Payer: 59 | Admitting: Physical Therapy

## 2020-01-04 DIAGNOSIS — R2689 Other abnormalities of gait and mobility: Secondary | ICD-10-CM

## 2020-01-04 DIAGNOSIS — R2681 Unsteadiness on feet: Secondary | ICD-10-CM | POA: Diagnosis not present

## 2020-01-04 DIAGNOSIS — R293 Abnormal posture: Secondary | ICD-10-CM | POA: Diagnosis not present

## 2020-01-04 DIAGNOSIS — M6281 Muscle weakness (generalized): Secondary | ICD-10-CM | POA: Diagnosis not present

## 2020-01-04 DIAGNOSIS — M62830 Muscle spasm of back: Secondary | ICD-10-CM | POA: Diagnosis not present

## 2020-01-04 NOTE — Therapy (Signed)
[place], [place], [address] Phone: [phone]   Fax:  [phone]  Physical Therapy Treatment  Patient Details  Name: Christine Butler MRN: [id number] Date of Birth: 09-26-1970 Referring Provider (PT): Lauralee Evener Placido Sou, NP    Encounter Date: 01/04/2020   PT End of Session - 01/04/20 1327    Visit Number 7    Number of Visits 17    Date for PT Re-Evaluation 01/19/20    Authorization Type [facility] UMR    PT Start Time 1552    PT Stop Time 1410    PT Time Calculation (min) 43 min    Activity Tolerance Patient tolerated treatment well    Behavior During Therapy [facility] for tasks assessed/performed           Past Medical History:  Diagnosis Date  . (HFpEF) heart failure with preserved ejection fraction ([place]) 08/09/2019  . Acute blood loss anemia 04/20/2017  . Altered mental status 08/14/2019  . Arthritis    right knee  . Ascites--mild this admit 11/06/2017   per patient , now stable with meds and lifestlye adjustment  . Atypical chest pain 11/05/2017  . Atypical squamous cells of undetermined significance (ASCUS) on Papanicolaou smear of cervix 08/05/2018  . CAD (coronary artery disease)    a. s/p CABGx2 in 02/2017 (LAD not suitable for PCI), EF normal.  . CAD (coronary artery disease), native coronary artery 03/23/2017  . 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 Cirrhosis ([place]) 03/16/2017  . Coronary artery disease 03/24/2017  . Dyslipidemia, goal LDL below 70 10/13/2017  . Elevated LFTs 04/20/2017  . Encephalopathy 09/29/2019  . Familial hyperlipidemia   . GERD (gastroesophageal reflux disease)   . GI bleed 02/01/2018  . H/O LEEP 03/30/2019  . H/O two vessel coronary artery bypass graft 03/30/2019  . High grade squamous intraepithelial lesion (HGSIL) on cytologic smear of cervix 05/04/2019  . Hypo-osmolality and hyponatremia 03/30/2019  . IBS (irritable bowel syndrome)   . Other insomnia 09/05/2019   . Pancytopenia ([place]) 09/28/2019  . PONV (postoperative nausea and vomiting)    i always throw up on waking up , but last EGD in march had no issues    . Port-A-Cath in place 11/21/2019  . Primary biliary cholangitis ([place]) 07/26/2012   Formatting of this note might be different from the original. IMOUPDATE  . Primary biliary cirrhosis (HCC)    cirhosis/liver disease followed by transplant team led by Dr Manuella Ghazi at [facility]   . S/P CABG (coronary artery bypass graft)   . S/P CABG x 2 03/24/2017   LIMA to DIAGONAL Portion of SVG/LEFT RADIAL to LAD  . S/P LEEP (loop electrosurgical excision procedure) 05/11/2019  . Status post liver transplantation ([place]) 03/30/2019  . SVD (spontaneous vaginal delivery)    x 3  . Upper GI bleed 02/01/2018  . Urinary tract bacterial infections     Past Surgical History:  Procedure Laterality Date  . CORONARY ARTERY BYPASS GRAFT N/A 03/24/2017   Procedure: CORONARY ARTERY BYPASS GRAFTING (CABG) x two, using left internal mammary artery, left radial artery, and right leg greater saphenous vein harvested endoscopically;  Surgeon: Ivin Poot, MD;  Location: [facility];  Service: Open Heart Surgery;  Laterality: N/A;  . ENDOVEIN HARVEST OF GREATER SAPHENOUS VEIN Right 03/24/2017   Procedure: ENDOVEIN HARVEST OF GREATER SAPHENOUS VEIN;  Surgeon: Ivin Poot, MD;  Location: [place];  Service: Open Heart Surgery;  Laterality: Right;  .  ESOPHAGEAL BANDING  02/01/2018   Procedure: ESOPHAGEAL BANDING;  Surgeon: [name], MD;  Location: [facility];  Service: Gastroenterology;;  . ESOPHAGEAL BANDING N/A 03/29/2018   Procedure: ESOPHAGEAL BANDING;  Surgeon: [name], MD;  Location: [facility];  Service: Gastroenterology;  Laterality: N/A;  . ESOPHAGEAL BANDING N/A 05/21/2018   Procedure: ESOPHAGEAL BANDING;  Surgeon: [name], MD;  Location: [facility];  Service: Gastroenterology;  Laterality: N/A;  . ESOPHAGEAL BANDING N/A 10/11/2018   Procedure: ESOPHAGEAL BANDING;   Surgeon: [name], MD;  Location: [facility];  Service: Gastroenterology;  Laterality: N/A;  . ESOPHAGOGASTRODUODENOSCOPY N/A 03/29/2018   Procedure: ESOPHAGOGASTRODUODENOSCOPY (EGD);  Surgeon: [name], MD;  Location: [facility];  Service: Gastroenterology;  Laterality: N/A;  . ESOPHAGOGASTRODUODENOSCOPY N/A 05/21/2018   Procedure: ESOPHAGOGASTRODUODENOSCOPY (EGD);  Surgeon: [name], MD;  Location: [facility];  Service: Gastroenterology;  Laterality: N/A;  . ESOPHAGOGASTRODUODENOSCOPY (EGD) WITH PROPOFOL N/A 02/01/2018   Procedure: ESOPHAGOGASTRODUODENOSCOPY (EGD) WITH PROPOFOL;  Surgeon: [name], MD;  [facility]: [facility];  Service: Gastroenterology;  Laterality: N/A;  . ESOPHAGOGASTRODUODENOSCOPY (EGD) WITH PROPOFOL N/A 10/11/2018   Procedure: ESOPHAGOGASTRODUODENOSCOPY (EGD) WITH PROPOFOL;  Surgeon: [name], MD;  [facility]: [facility];  Service: Gastroenterology;  Laterality: N/A;  . INCONTINENCE SURGERY    . IR IMAGING GUIDED PORT INSERTION  04/08/2019  . LEEP N/A 03/28/2014   Procedure: LOOP ELECTROSURGICAL EXCISION PROCEDURE (LEEP) cone biopsy;  Surgeon: Cyril Mourning, MD;  Location: [facility];  Service: Gynecology;  Laterality: N/A;  . LEFT HEART CATH AND CORONARY ANGIOGRAPHY N/A 03/23/2017   Procedure: LEFT HEART CATH AND CORONARY ANGIOGRAPHY;  Surgeon: Jettie Booze, MD;  Location: [facility];  Service: Cardiovascular;  Laterality: N/A;  . LIVER BIOPSY     x 2  . RADIAL ARTERY HARVEST Left 03/24/2017   Procedure: RADIAL ARTERY HARVEST;  Surgeon: Ivin Poot, MD;  Location: [facility];  Service: Open Heart Surgery;  Laterality: Left;  . TEE WITHOUT CARDIOVERSION N/A 03/24/2017   Procedure: TRANSESOPHAGEAL ECHOCARDIOGRAM (TEE);  Surgeon: Prescott Gum, Collier Salina, MD;  Location: [facility];  Service: Open Heart Surgery;  Laterality: N/A;  . WISDOM TOOTH EXTRACTION      There were no vitals filed for this visit.   Subjective Assessment - 01/04/20 1327     Subjective "I went and [place] yesterday and just felt sore. After last visit I felt sore with the abdominal workouts.    Pertinent History CAD, liver tranplants    Limitations Standing;Walking;Sitting    How long can you sit comfortably? 1 hour    How long can you stand comfortably? 15 min    How long can you walk comfortably? 15 min    Patient Stated Goals get back to walking a mile, increase stamina, promote strength and return work.    Currently in Pain? No/denies    Pain Onset More than a month ago                             [facility] Adult PT Treatment/Exercise - 01/04/20 0001      Exercises   Exercises Shoulder      Knee/Hip Exercises: Stretches   Hip Flexor Stretch Both;4 reps;30 seconds   Stair stretch on cybex hip machine      Knee/Hip Exercises: Aerobic   Nustep L5 x 4 min UE & LE both (UE at 8)      Knee/Hip Exercises: Standing   Wall  Squat 1 set;10 reps   30s   Wall Squat Limitations VC/TC feet hip width apart, knees over toes      Knee/Hip Exercises: Seated   Marching Strengthening;Both;2 sets;10 reps   Green Programmer, multimedia Limitations --   VC to not use hands     Shoulder Exercises: Seated   Other Seated Exercises Palloff press with HABD and HADD on green swiss ball 2x10      Shoulder Exercises: Standing   Row AROM;Strengthening;Both;10 reps;Theraband    Theraband Level (Shoulder Row) Level 2 (Red)    Row Limitations VC/TC for scapular retraction at end of motion, core engagement throughout     Other Standing Exercises Bicep curl to shoulder press bilateral 2# 3x5                  PT Education - 01/04/20 1401    Education Details reviewed HEP, importance of slowing adding reps for endurance/strengthening    Person(s) Educated Patient    Methods Explanation;Handout;Demonstration    Comprehension Verbalized understanding;Returned demonstration;Verbal cues required;Tactile cues required            PT Short Term  Goals - 11/24/19 1426      PT SHORT TERM GOAL #1   Title pt to be IND with initial HEP    Time 4    Period Weeks    Status New    Target Date 12/22/19      PT SHORT TERM GOAL #2   Title increase bil hip abductor / extensor strength to >/ 3-/5 to promote hip stability    Time 4    Period Weeks    Status New    Target Date 12/22/19      PT SHORT TERM GOAL #3   Title increase 5 x sit to stand  and TUG to </= 13 seconds to work toward functional / safe transitions    Time 4    Period Weeks    Status New    Target Date 12/22/19             PT Long Term Goals - 12/29/19 1206      PT LONG TERM GOAL #1   Title increase bil LE gross strength to 4+/5 to promtoe hip/ knee stability and promote efficient posture    Time 8    Period Weeks    Status New      PT LONG TERM GOAL #2   Title increase bil Gross UE strength to >/= 4+/5 to assist with functional lifting/ carrying / pushing and pull as it relates to potential work related tasks.    Time 8    Period Weeks    Status New      PT LONG TERM GOAL #3   Title improve 5 x STS to </= 12 seconds and TUG to </= 10 seconds to for functiona/ efficent and safe mobility    Time 8    Period Weeks    Status New      PT LONG TERM GOAL #4   Title pt to be able to stand and walk for >/= 1 hour for functional endurnace required for work tasks and community ambulation with no report of pain    Time 8    Period Weeks    Status New      PT LONG TERM GOAL #5   Title increase FOTO score to </=44% limited to demo improvement in function    Baseline 57% limited at  initial eval    Time 8    Period Weeks    Status Partially Met      PT LONG TERM GOAL #6   Title pt to be IND with all HEP to maintain and progress current LOF IND    Time 8    Period Weeks    Status New                 Plan - 01/04/20 1402    Clinical Impression Statement Today therapy focused on core and UE workouts since pt had gone on a long hike yesterday and  their legs were sore. Pt is progressing well with exercises, tolerated them well, and needed moderate TC/VC to complete exercises. Pt continues to need endurance training especially of the hip and core to improve tolerance to work demands with the goal of returning to work in Jan 2023.    Personal Factors and Comorbidities Comorbidity 1    Comorbidities hx of liver transplant, anemia, significant PMHx/ surgical hx    Stability/Clinical Decision Making Evolving/Moderate complexity    Rehab Potential Good    PT Frequency 2x / week    PT Duration 8 weeks    PT Treatment/Interventions ADLs/Self Care Home Management;Moist Heat;Cryotherapy;Gait training;Stair training;Functional mobility training;Therapeutic activities;Therapeutic exercise;Balance training;Neuromuscular re-education;Manual techniques;Passive range of motion;Taping;Patient/family education    PT Next Visit Plan utilize stair boxes for stair training (esp descent), work on endurance and strengthening of lower quarter and upper quarter and CORE    PT Home Exercise Plan [phone] - low back stretch (seated walking hands on chair), sit to stand with hands on knees, lower trunk rotations, standing hip flexor stretch, STS, Heel taps off step, wall squat, anti-rotation step outs,    Consulted and Agree with Plan of Care Patient           Patient will benefit from skilled therapeutic intervention in order to improve the following deficits and impairments:  Abnormal gait, Improper body mechanics, Postural dysfunction, Increased muscle spasms, Decreased strength, Pain, Decreased activity tolerance, Decreased balance, Decreased endurance  Visit Diagnosis: Muscle weakness (generalized)  Other abnormalities of gait and mobility  Unsteadiness on feet  Abnormal posture     Problem List Patient Active Problem List   Diagnosis Date Noted  . S/P CABG (coronary artery bypass graft)   . Port-A-Cath in place 11/21/2019  . Encephalopathy  09/29/2019  . Pancytopenia ([place]) 09/28/2019  . Arthritis 09/21/2019  . PONV (postoperative nausea and vomiting) 09/21/2019  . SVD (spontaneous vaginal delivery) 09/21/2019  . Urinary tract bacterial infections 09/21/2019  . CAD (coronary artery disease)   . Other insomnia 09/05/2019  . 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 Altered mental status 08/14/2019  . (HFpEF) heart failure with preserved ejection fraction ([place]) 08/09/2019  . S/P LEEP (loop electrosurgical excision procedure) 05/11/2019  . High grade squamous intraepithelial lesion (HGSIL) on cytologic smear of cervix 05/04/2019  . Familial hyperlipidemia 03/30/2019  . H/O two vessel coronary artery bypass graft 03/30/2019  . Status post liver transplantation ([place]) 03/30/2019  . Hypo-osmolality and hyponatremia 03/30/2019  . Atypical squamous cells of undetermined significance (ASCUS) on Papanicolaou smear of cervix 08/05/2018  . Upper GI bleed 02/01/2018  . GI bleed 02/01/2018  . Ascites--mild this admit 11/06/2017  . Atypical chest pain 11/05/2017  . Dyslipidemia, goal LDL below 70 10/13/2017  . Acute blood loss anemia 04/20/2017  . Elevated LFTs 04/20/2017  . Coronary artery disease 03/24/2017  . S/P CABG x 2 03/24/2017  . CAD (coronary  artery disease), native coronary artery 03/23/2017  . Cirrhosis ([place]) 03/16/2017  . GERD (gastroesophageal reflux disease) 01/28/2012  . Primary biliary cirrhosis ([place]) 01/28/2012  . IBS (irritable bowel syndrome) 01/28/2012  . Primary biliary cholangitis ([place]) 01/28/2012    Dawayne Cirri, SPT 01/04/2020, 3:37 PM  [facility] [address] [place], [place], [address] Phone: [phone]   Fax:  [phone]  Name: Christine Butler MRN: [id number] Date of Birth: 14-Oct-1970

## 2020-01-05 DIAGNOSIS — Z944 Liver transplant status: Principal | ICD-10-CM

## 2020-01-05 DIAGNOSIS — Z79899 Other long term (current) drug therapy: Principal | ICD-10-CM

## 2020-01-05 LAB — TACROLIMUS LEVEL: TACROLIMUS BLOOD: 4.2 ng/mL (ref 2.0–20.0)

## 2020-01-05 MED ORDER — TACROLIMUS XR 4 MG TABLET,EXTENDED RELEASE 24 HR
ORAL_TABLET | ORAL | 11 refills | 0.00000 days | Status: CP
Start: 2020-01-05 — End: 2020-01-12

## 2020-01-05 MED ORDER — TACROLIMUS XR 1 MG TABLET,EXTENDED RELEASE 24 HR
ORAL_TABLET | 11 refills | 0 days | Status: CP
Start: 2020-01-05 — End: 2020-01-12

## 2020-01-05 NOTE — Unmapped (Signed)
Reviewed patient's 11/8 tacrolimus level of 4.2 (goal 8-10) with PharmD Chargualaf - patient transitioned to Envarsus 5.mg ~11/3 - per PHarmD patient to increase envarsus dosing to 6.mg once daily - call placed to patient to relay dosing increase she verbalized understanding.

## 2020-01-09 DIAGNOSIS — Z944 Liver transplant status: Principal | ICD-10-CM

## 2020-01-09 DIAGNOSIS — Z79899 Other long term (current) drug therapy: Principal | ICD-10-CM

## 2020-01-09 DIAGNOSIS — N879 Dysplasia of cervix uteri, unspecified: Secondary | ICD-10-CM | POA: Diagnosis not present

## 2020-01-09 DIAGNOSIS — R87613 High grade squamous intraepithelial lesion on cytologic smear of cervix (HGSIL): Secondary | ICD-10-CM | POA: Diagnosis not present

## 2020-01-09 LAB — CBC W/ DIFFERENTIAL
BANDED NEUTROPHILS ABSOLUTE COUNT: 0 10*3/uL (ref 0.0–0.1)
BASOPHILS ABSOLUTE COUNT: 0 10*3/uL (ref 0.0–0.2)
BASOPHILS RELATIVE PERCENT: 1 %
EOSINOPHILS ABSOLUTE COUNT: 0.1 10*3/uL (ref 0.0–0.4)
EOSINOPHILS RELATIVE PERCENT: 3 %
HEMATOCRIT: 28.3 % — ABNORMAL LOW (ref 34.0–46.6)
HEMOGLOBIN: 9.6 g/dL — ABNORMAL LOW (ref 11.1–15.9)
IMMATURE GRANULOCYTES: 1 %
LYMPHOCYTES ABSOLUTE COUNT: 0.7 10*3/uL (ref 0.7–3.1)
LYMPHOCYTES RELATIVE PERCENT: 23 %
MEAN CORPUSCULAR HEMOGLOBIN CONC: 33.9 g/dL (ref 31.5–35.7)
MEAN CORPUSCULAR HEMOGLOBIN: 33.4 pg — ABNORMAL HIGH (ref 26.6–33.0)
MEAN CORPUSCULAR VOLUME: 99 fL — ABNORMAL HIGH (ref 79–97)
MONOCYTES ABSOLUTE COUNT: 0.2 10*3/uL (ref 0.1–0.9)
MONOCYTES RELATIVE PERCENT: 8 %
NEUTROPHILS ABSOLUTE COUNT: 1.9 10*3/uL (ref 1.4–7.0)
NEUTROPHILS RELATIVE PERCENT: 64 %
PLATELET COUNT: 148 10*3/uL — ABNORMAL LOW (ref 150–450)
RED BLOOD CELL COUNT: 2.87 x10E6/uL — ABNORMAL LOW (ref 3.77–5.28)
RED CELL DISTRIBUTION WIDTH: 15.4 % (ref 11.7–15.4)
WHITE BLOOD CELL COUNT: 2.9 10*3/uL — ABNORMAL LOW (ref 3.4–10.8)

## 2020-01-10 ENCOUNTER — Telehealth: Payer: Self-pay | Admitting: Internal Medicine

## 2020-01-10 DIAGNOSIS — Z79899 Other long term (current) drug therapy: Principal | ICD-10-CM

## 2020-01-10 DIAGNOSIS — Z944 Liver transplant status: Principal | ICD-10-CM

## 2020-01-10 LAB — COMPREHENSIVE METABOLIC PANEL
A/G RATIO: 2.3 — ABNORMAL HIGH (ref 1.2–2.2)
ALBUMIN: 4.2 g/dL (ref 3.8–4.8)
ALKALINE PHOSPHATASE: 107 IU/L (ref 44–121)
ALT (SGPT): 8 IU/L (ref 0–32)
AST (SGOT): 10 IU/L (ref 0–40)
BILIRUBIN TOTAL: 0.5 mg/dL (ref 0.0–1.2)
BLOOD UREA NITROGEN: 10 mg/dL (ref 6–24)
BUN / CREAT RATIO: 13 (ref 9–23)
CALCIUM: 9 mg/dL (ref 8.7–10.2)
CHLORIDE: 109 mmol/L — ABNORMAL HIGH (ref 96–106)
CO2: 22 mmol/L (ref 20–29)
CREATININE: 0.76 mg/dL (ref 0.57–1.00)
GLOBULIN, TOTAL: 1.8 g/dL (ref 1.5–4.5)
GLUCOSE: 87 mg/dL (ref 65–99)
POTASSIUM: 4 mmol/L (ref 3.5–5.2)
SODIUM: 145 mmol/L — ABNORMAL HIGH (ref 134–144)
TOTAL PROTEIN: 6 g/dL (ref 6.0–8.5)

## 2020-01-10 LAB — GAMMA GT: GAMMA GLUTAMYL TRANSFERASE: 31 IU/L (ref 0–60)

## 2020-01-10 LAB — MAGNESIUM: MAGNESIUM: 1.8 mg/dL (ref 1.6–2.3)

## 2020-01-10 LAB — PHOSPHORUS: PHOSPHORUS, SERUM: 3.9 mg/dL (ref 3.0–4.3)

## 2020-01-10 LAB — BILIRUBIN, DIRECT: BILIRUBIN DIRECT: 0.22 mg/dL (ref 0.00–0.40)

## 2020-01-10 NOTE — Unmapped (Signed)
Spoke to pt on the phone from 3:35-3:40PM to follow up on the switch from Zyprexa to Remeron. Pt reports that her appetite has improved since the switch. Her sleep has been ok, and she has been tossing and turning throughout the night for the past few nights since moving back to her old room. She still gets 8-9 hrs of sleep, but some nights she does not feel rested. She denies daytime grogginess, and she reports having more energy in general during the day. Discussed w/ her about increasing Remeron to 15mg  to see if it will help with the tossing and turning and night. Pt was amenable. Pt instructed to update me via MyChart if she has any issues including side effects. Pt expressed understanding. Pt will follow up with me in 2 weeks.

## 2020-01-10 NOTE — Telephone Encounter (Signed)
PA for Praluent submitted to [employer] & approved until 01/04/2021 Must be filled at [facility] for 1 month supply

## 2020-01-11 ENCOUNTER — Telehealth (INDEPENDENT_AMBULATORY_CARE_PROVIDER_SITE_OTHER): Payer: 59 | Admitting: Internal Medicine

## 2020-01-11 ENCOUNTER — Ambulatory Visit: Payer: 59 | Admitting: Physical Therapy

## 2020-01-11 ENCOUNTER — Encounter: Payer: Self-pay | Admitting: Internal Medicine

## 2020-01-11 VITALS — BP 116/62 | Wt 116.0 lb

## 2020-01-11 DIAGNOSIS — E119 Type 2 diabetes mellitus without complications: Principal | ICD-10-CM

## 2020-01-11 DIAGNOSIS — Z944 Liver transplant status: Principal | ICD-10-CM

## 2020-01-11 DIAGNOSIS — E559 Vitamin D deficiency, unspecified: Principal | ICD-10-CM

## 2020-01-11 DIAGNOSIS — E785 Hyperlipidemia, unspecified: Principal | ICD-10-CM

## 2020-01-11 DIAGNOSIS — Z951 Presence of aortocoronary bypass graft: Secondary | ICD-10-CM | POA: Diagnosis not present

## 2020-01-11 DIAGNOSIS — I2510 Atherosclerotic heart disease of native coronary artery without angina pectoris: Secondary | ICD-10-CM | POA: Diagnosis not present

## 2020-01-11 DIAGNOSIS — K743 Primary biliary cirrhosis: Secondary | ICD-10-CM | POA: Diagnosis not present

## 2020-01-11 LAB — TACROLIMUS LEVEL: TACROLIMUS BLOOD: 4.2 ng/mL (ref 2.0–20.0)

## 2020-01-11 NOTE — Unmapped (Signed)
TRANSPLANT SURGERY PROGRESS NOTE    Assessment and Plan  Kathy Armstrong is a 49 y.o. 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 She presents today for 3 month post transplant visit.     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     Immunization History   Administered Date(s) Administered   ??? COVID-19 VACC,MRNA,(PFIZER)(PF)(IM) 02/22/2019, 03/15/2019, 12/22/2019   ??? Human Pappillomavirus Vaccine,9-Valent(PF) 04/08/2018, 05/31/2018   ??? Influenza Vaccine Quad (IIV4 PF) 80mo+ injectable 11/11/2019   ??? Influenza Virus Vaccine, unspecified formulation 12/08/2017, 12/07/2018   ??? PNEUMOCOCCAL POLYSACCHARIDE 23 11/29/2018     Follow up: 6 months post transplant  Labs: weekly    I personally spent 25 minutes face-to-face and non-face-to-face in the care of this patient, which includes all pre, intra, and post visit time on the date of service. Greater than 50% of the time was spent on counseling and the substance of the discussion.      Subjective  Kathy Armstrong is a 49 y.o. 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 Her PMH is additionally significant for CAD s/p CABG x2 2019, familial HLD, heart failure (last echo 8/21 with LVEF 60-65%). She presents today for 3 month follow up.     Her post-operative course has been complicated by catatonia with psych consult (resolveD), E Coli UTI admitted 10/5-10/8, diarrhea and biliary stricture, admitted 10/12-10/16 with pancreatitis and stent placed on 10/14 with symptom resolution. Since that time she has done well at home. shes going on hikes and hydrating well. No NVDC. No HA or dizzines, no tremors.    Objective    Vitals:    01/12/20 0949   BP: 125/73   BP Site: R Arm   BP Position: Sitting   Pulse: 76   Temp: 36.4 ??C (97.5 ??F)   TempSrc: Tympanic   Weight: 54.1 kg (119 lb 4.8 oz)   Height: 157.5 cm (5' 2.01)      Body mass index is 21.81 kg/m??.     Physical Exam:    General Appearance:   No acute distress  Lungs:                Clear to auscultation bilaterally  Heart:                           Regular rate and rhythm  Abdomen:                Soft, non-tender, non-distended. Her large mercedes incision from her liver transplant has healed well.   Extremities:              Warm and well perfused, no edema        Data Review:  All lab results last 24 hours:    Recent Results (from the past 48 hour(s))  Hemoglobin A1c    Collection Time: 01/12/20  9:56 AM   Result Value Ref Range    Hemoglobin A1C <4.0 (L) 4.8 - 5.6 %   Lipid panel    Collection Time: 01/12/20  9:56 AM   Result Value Ref Range    Triglycerides 131 0 - 150 mg/dL    Cholesterol 161 <=096 mg/dL    HDL 41 40 - 60 mg/dL    LDL Calculated 50 40 - 99 mg/dL    VLDL Cholesterol Cal 26.2 9 - 37 mg/dL    Chol/HDL Ratio 2.9 1.0 - 4.5    Non-HDL Cholesterol 76 70 - 130 mg/dL    FASTING Unknown    Tacrolimus Level, Trough    Collection Time: 01/12/20  9:56 AM   Result Value Ref Range    Tacrolimus, Trough 4.3 (L) 5.0 - 15.0 ng/mL   Gamma GT    Collection Time: 01/12/20  9:56 AM   Result Value Ref Range    GGT 29 0 - 38 U/L   Magnesium Level    Collection Time: 01/12/20  9:56 AM   Result Value Ref Range    Magnesium 1.4 (L) 1.6 - 2.6 mg/dL   Phosphorus Level    Collection Time: 01/12/20  9:56 AM   Result Value Ref Range    Phosphorus 3.8 2.4 - 5.1 mg/dL   Bilirubin, Direct    Collection Time: 01/12/20  9:56 AM   Result Value Ref Range    Bilirubin, Direct 0.20 0.00 - 0.30 mg/dL Comprehensive Metabolic Panel    Collection Time: 01/12/20  9:56 AM   Result Value Ref Range    Sodium 140 135 - 145 mmol/L    Potassium 3.7 3.5 - 5.1 mmol/L    Chloride 108 (H) 98 - 107 mmol/L    Anion Gap 9 5 - 14 mmol/L    CO2 23.0 20.0 - 31.0 mmol/L    BUN 16 9 - 23 mg/dL    Creatinine 0.45 4.09 - 0.80 mg/dL    BUN/Creatinine Ratio 20     EGFR CKD-EPI Non-African American, Female 87 >=60 mL/min/1.14m2    EGFR CKD-EPI African American, Female >90 >=60 mL/min/1.50m2    Glucose 98 70 - 179 mg/dL    Calcium 8.9 8.7 - 81.1 mg/dL    Albumin 3.4 3.4 - 5.0 g/dL    Total Protein 5.8 5.7 - 8.2 g/dL    Total Bilirubin 0.4 0.3 - 1.2 mg/dL    AST 10 <=91 U/L    ALT <7 (L) 10 - 49 U/L    Alkaline Phosphatase 89 46 - 116 U/L   Iron Panel    Collection Time: 01/12/20  9:56 AM   Result Value Ref Range    Iron 130 50 - 170 ug/dL    TIBC 478 (L) 295 - 621 ug/dL    Iron Saturation (%) 61 %   CBC w/ Differential    Collection Time: 01/12/20  9:56 AM   Result Value Ref Range    WBC 2.5 (L) 4.5 - 11.0 10*9/L    RBC 2.70 (L) 4.00 - 5.20 10*12/L    HGB 9.3 (L) 12.0 - 16.0 g/dL    HCT 30.8 (L) 65.7 - 46.0 %    MCV 98.1 80.0 - 100.0 fL    MCH 34.5 (H) 26.0 - 34.0 pg    MCHC 35.2 31.0 - 37.0 g/dL    RDW 84.6 (H) 96.2 - 15.0 %    MPV 8.2 7.0 -  10.0 fL    Platelet 166 150 - 440 10*9/L    Variable HGB Concentration Slight (A) Not Present    Neutrophils % 59.9 %    Lymphocytes % 29.8 %    Monocytes % 3.0 %    Eosinophils % 4.5 %    Basophils % 1.0 %    Neutrophil Left Shift 1+ (A) Not Present    Absolute Neutrophils 1.5 (L) 2.0 - 7.5 10*9/L    Absolute Lymphocytes 0.7 (L) 1.5 - 5.0 10*9/L    Absolute Monocytes 0.1 (L) 0.2 - 0.8 10*9/L    Absolute Eosinophils 0.1 0.0 - 0.4 10*9/L    Absolute Basophils 0.0 0.0 - 0.1 10*9/L    Large Unstained Cells 2 0 - 4 %    Macrocytosis Moderate (A) Not Present    Anisocytosis Slight (A) Not Present    Hyperchromasia Slight (A) Not Present   Morphology Review    Collection Time: 01/12/20  9:56 AM   Result Value Ref Range    Smear Review Comments See Comment (A) Undefined           Gertie Fey, DNP, APRN, FNP-C  Abdominal Transplant Surgery Nurse Practitioner  [facility] for [facility]  [address]  [place], [place]  [address]  [phone]

## 2020-01-11 NOTE — Patient Instructions (Signed)
Medication Instructions:  Remain off Praluent and other cholesterol medications for now  *If you need a refill on your cardiac medications before your next appointment, please call your pharmacy*   Lab Work: Fasting lipid panel in 6 months  If you have labs (blood work) drawn today and your tests are completely normal, you will receive your results only by:  [place] (if you have MyChart) OR  A paper copy in the mail If you have any lab test that is abnormal or we need to change your treatment, we will call you to review the results.   Testing/Procedures: None   Follow-Up: At [facility], you and your health needs are our priority.  As part of our continuing mission to provide you with exceptional heart care, we have created designated Provider Care Teams.  These Care Teams include your primary Cardiologist (physician) and Advanced Practice Providers (APPs -  Physician Assistants and Nurse Practitioners) who all work together to provide you with the care you need, when you need it.  We recommend signing up for the patient portal called "MyChart".  Sign up information is provided on this After Visit Summary.  MyChart is used to connect with patients for Virtual Visits (Telemedicine).  Patients are able to view lab/test results, encounter notes, upcoming appointments, etc.  Non-urgent messages can be sent to your provider as well.   To learn more about what you can do with MyChart, go to [email or web address].    Your next appointment:   6 month(s)  The format for your next appointment:   In Person or Virtual  Provider:   Raliegh Ip Mali Hilty, MD   Other Instructions

## 2020-01-11 NOTE — Progress Notes (Signed)
Virtual Visit via Video Note   This visit type was conducted due to national recommendations for restrictions regarding the COVID-19 Pandemic (e.g. social distancing) in an effort to limit this patient's exposure and mitigate transmission in our community.  Due to her co-morbid illnesses, this patient is at least at moderate risk for complications without adequate follow up.  This format is felt to be most appropriate for this patient at this time.  All issues noted in this document were discussed and addressed.  A limited physical exam was performed with this format.  Please refer to the patient's chart for her consent to telehealth for [facility].  Date:  01/11/2020   ID:  Christine Butler, DOB Jun 04, 1970, MRN [id number] The patient was identified using 2 identifiers.  Evaluation Performed:  Follow-Up Visit  Patient Location:  [place] [address]  Provider location:   [address] [place] [place], [place] [address]  PCP:  Maude Leriche, [place]  Cardiologist:  [name], MD Electrophysiologist:  None   Chief Complaint:  Recent liver transplant  History of Present Illness:    Christine Butler is a 49 y.o. female who presents via audio/video conferencing for a telehealth visit today.  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  She has significant dyslipidemia however in the setting of end-stage liver disease.  This was thought to be due to [place].  She ultimately recently underwent liver transplant and had a marked reduction in her lipids across the board.  She had subsequently not refilled her Praluent and has been off the medication.  Labs obtained 2 weeks ago showed total cholesterol 137, triglycerides 190, HDL 41 and LDL 64 off therapy.  She does have a history of CABG and diastolic heart failure.  Overall she says she is feeling much better.  She still adjusting to the medications for her transplant.  The  patient does not have symptoms concerning for COVID-19 infection (fever, chills, cough, or new SHORTNESS OF BREATH).    Prior CV studies:   The following studies were reviewed today:  Chart reviewed  PMHx:  Past Medical History:  Diagnosis Date  . (HFpEF) heart failure with preserved ejection fraction ([place]) 08/09/2019  . Acute blood loss anemia 04/20/2017  . Altered mental status 08/14/2019  . Arthritis    right knee  . Ascites--mild this admit 11/06/2017   per patient , now stable with meds and lifestlye adjustment  . Atypical chest pain 11/05/2017  . Atypical squamous cells of undetermined significance (ASCUS) on Papanicolaou smear of cervix 08/05/2018  . CAD (coronary artery disease)    a. s/p CABGx2 in 02/2017 (LAD not suitable for PCI), EF normal.  . CAD (coronary artery disease), native coronary artery 03/23/2017  . 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 Cirrhosis ([place]) 03/16/2017  . Coronary artery disease 03/24/2017  . Dyslipidemia, goal LDL below 70 10/13/2017  . Elevated LFTs 04/20/2017  . Encephalopathy 09/29/2019  . Familial hyperlipidemia   . GERD (gastroesophageal reflux disease)   . GI bleed 02/01/2018  . H/O LEEP 03/30/2019  . H/O two vessel coronary artery bypass graft 03/30/2019  . High grade squamous intraepithelial lesion (HGSIL) on cytologic smear of cervix 05/04/2019  . Hypo-osmolality and hyponatremia 03/30/2019  . IBS (irritable bowel syndrome)   . Other insomnia 09/05/2019  . Pancytopenia ([place]) 09/28/2019  . PONV (postoperative nausea and vomiting)    i always throw up on waking up ,  but last EGD in march had no issues    . Port-A-Cath in place 11/21/2019  . 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 IMOUPDATE  . Primary biliary cirrhosis (HCC)    cirhosis/liver disease followed by transplant team led by Dr Manuella Ghazi at [facility]   . S/P CABG (coronary artery bypass graft)   . S/P CABG x 2  03/24/2017   LIMA to DIAGONAL Portion of SVG/LEFT RADIAL to LAD  . S/P LEEP (loop electrosurgical excision procedure) 05/11/2019  . Status post liver transplantation ([place]) 03/30/2019  . SVD (spontaneous vaginal delivery)    x 3  . Upper GI bleed 02/01/2018  . Urinary tract bacterial infections     Past Surgical History:  Procedure Laterality Date  . CORONARY ARTERY BYPASS GRAFT N/A 03/24/2017   Procedure: CORONARY ARTERY BYPASS GRAFTING (CABG) x two, using left internal mammary artery, left radial artery, and right leg greater saphenous vein harvested endoscopically;  Surgeon: Ivin Poot, MD;  Location: [facility];  Service: Open Heart Surgery;  Laterality: N/A;  . ENDOVEIN HARVEST OF GREATER SAPHENOUS VEIN Right 03/24/2017   Procedure: ENDOVEIN HARVEST OF GREATER SAPHENOUS VEIN;  Surgeon: Ivin Poot, MD;  Location: [facility];  Service: Open Heart Surgery;  Laterality: Right;  . ESOPHAGEAL BANDING  02/01/2018   Procedure: ESOPHAGEAL BANDING;  Surgeon: [name], MD;  Location: [facility];  Service: Gastroenterology;;  . ESOPHAGEAL BANDING N/A 03/29/2018   Procedure: ESOPHAGEAL BANDING;  Surgeon: [name], MD;  Location: [facility];  Service: Gastroenterology;  Laterality: N/A;  . ESOPHAGEAL BANDING N/A 05/21/2018   Procedure: ESOPHAGEAL BANDING;  Surgeon: [name], MD;  Location: [facility];  Service: Gastroenterology;  Laterality: N/A;  . ESOPHAGEAL BANDING N/A 10/11/2018   Procedure: ESOPHAGEAL BANDING;  Surgeon: [name], MD;  Location: [facility];  Service: Gastroenterology;  Laterality: N/A;  . ESOPHAGOGASTRODUODENOSCOPY N/A 03/29/2018   Procedure: ESOPHAGOGASTRODUODENOSCOPY (EGD);  Surgeon: [name], MD;  Location: [facility];  Service: Gastroenterology;  Laterality: N/A;  . ESOPHAGOGASTRODUODENOSCOPY N/A 05/21/2018   Procedure: ESOPHAGOGASTRODUODENOSCOPY (EGD);  Surgeon: [name], MD;  Location: [facility];  Service: Gastroenterology;  Laterality: N/A;  .  ESOPHAGOGASTRODUODENOSCOPY (EGD) WITH PROPOFOL N/A 02/01/2018   Procedure: ESOPHAGOGASTRODUODENOSCOPY (EGD) WITH PROPOFOL;  Surgeon: [name], MD;  Location: [facility];  Service: Gastroenterology;  Laterality: N/A;  . ESOPHAGOGASTRODUODENOSCOPY (EGD) WITH PROPOFOL N/A 10/11/2018   Procedure: ESOPHAGOGASTRODUODENOSCOPY (EGD) WITH PROPOFOL;  Surgeon: [name], MD;  Location: [facility];  Service: Gastroenterology;  Laterality: N/A;  . INCONTINENCE SURGERY    . IR IMAGING GUIDED PORT INSERTION  04/08/2019  . LEEP N/A 03/28/2014   Procedure: LOOP ELECTROSURGICAL EXCISION PROCEDURE (LEEP) cone biopsy;  Surgeon: Cyril Mourning, MD;  Location: [facility];  Service: Gynecology;  Laterality: N/A;  . LEFT HEART CATH AND CORONARY ANGIOGRAPHY N/A 03/23/2017   Procedure: LEFT HEART CATH AND CORONARY ANGIOGRAPHY;  Surgeon: Jettie Booze, MD;  Location: [facility];  Service: Cardiovascular;  Laterality: N/A;  . LIVER BIOPSY     x 2  . RADIAL ARTERY HARVEST Left 03/24/2017   Procedure: RADIAL ARTERY HARVEST;  Surgeon: Ivin Poot, MD;  Location: [facility];  Service: Open Heart Surgery;  Laterality: Left;  . TEE WITHOUT CARDIOVERSION N/A 03/24/2017   Procedure: TRANSESOPHAGEAL ECHOCARDIOGRAM (TEE);  Surgeon: Prescott Gum, Collier Salina, MD;  Location: [facility];  Service: Open Heart Surgery;  Laterality: N/A;  . WISDOM TOOTH EXTRACTION  FAMHx:  Family History  Problem Relation Age of Onset  . CAD Father   . Diabetes Mellitus II Father   . Heart disease Father   . Heart attack Brother   . Heart disease Maternal Aunt   . Heart attack Paternal Grandmother   . Heart attack Paternal Grandfather     SOCHx:   reports that she has never smoked. She has never used smokeless tobacco. She reports previous alcohol use of about 1.0 standard drink of alcohol per week. She reports that she does not use drugs.  ALLERGIES:  Allergies  Allergen Reactions  . Codeine Nausea And Vomiting  . Erythromycin  Nausea And Vomiting  . Fentanyl Nausea And Vomiting    MEDS:  Current Meds  Medication Sig  . acetaminophen (TYLENOL) 500 MG tablet Take 1,000 mg by mouth every 8 (eight) hours as needed.  . Alirocumab (PRALUENT) 150 MG/ML SOAJ Inject 1 Dose into the skin every 14 (fourteen) days.  . ASPIRIN LOW DOSE 81 MG EC tablet Take 81 mg by mouth daily.  Marland Kitchen CALCIUM PO Take 2,000 mg by mouth in the morning and at bedtime.  . carvedilol (COREG) 25 MG tablet Take 25 mg by mouth 2 (two) times daily.   . cycloSPORINE (RESTASIS) 0.05 % ophthalmic emulsion Place 1 drop into both eyes at bedtime.   Marland Kitchen ENVARSUS XR 1 MG TB24 Take 6 mg by mouth daily.   . fluticasone (FLONASE) 50 MCG/ACT nasal spray Place 1 spray into both nostrils daily as needed for allergies or rhinitis.   Marland Kitchen loratadine (CLARITIN) 10 MG tablet Take 10 mg by mouth daily.  . mirtazapine (REMERON) 7.5 MG tablet Take 15 mg by mouth at bedtime.   . Multiple Vitamins-Minerals (DEKAS PLUS) CAPS Take 1 tablet by mouth daily.  . mycophenolate (MYFORTIC) 180 MG EC tablet Take 180 mg by mouth 2 (two) times daily.  Marland Kitchen omeprazole (PRILOSEC) 40 MG capsule Take 40 mg by mouth 2 (two) times daily.  . ondansetron (ZOFRAN-ODT) 8 MG disintegrating tablet Take 8 mg by mouth 3 (three) times daily.  . predniSONE (DELTASONE) 5 MG tablet Take 5 mg by mouth daily.  Marland Kitchen Specialty Vitamins Products (MAGNESIUM, AMINO ACID CHELATE,) 133 MG tablet Take 1 tablet by mouth 2 (two) times daily.  . ursodiol (ACTIGALL) 300 MG capsule Take 900 mg by mouth daily.   . valGANciclovir (VALCYTE) 450 MG tablet Take 450 mg by mouth daily. Until 01/18/20     ROS: Pertinent items noted in HPI and remainder of comprehensive ROS otherwise negative.  Labs/Other Tests and Data Reviewed:    Recent Labs: 09/19/2019: B Natriuretic Peptide 122.5 10/11/2019: ALT 43; BUN 22; Creatinine, Ser 1.24; Hemoglobin 7.1; Platelets 129; Potassium 4.9; Sodium 113; TSH 2.360   Recent Lipid Panel Lab  Results  Component Value Date/Time   CHOL 137 12/29/2019 08:22 AM   TRIG 190 (H) 12/29/2019 08:22 AM   HDL 41 12/29/2019 08:22 AM   CHOLHDL 3.3 12/29/2019 08:22 AM   CHOLHDL NOT CALCULATED 12/18/2017 07:20 AM   LDLCALC 64 12/29/2019 08:22 AM   LDLDIRECT 229 (H) 12/30/2017 08:56 AM    Wt Readings from Last 3 Encounters:  01/11/20 116 lb (52.6 kg)  10/11/19 138 lb (62.6 kg)  09/27/19 135 lb (61.2 kg)     Exam:    Vital Signs:  BP 116/62   Wt 116 lb (52.6 kg)   BMI 18.72 kg/m    General appearance: alert and no distress Lungs: No audible wheezing  Abdomen: soft, non-tender; bowel sounds normal; no masses,  no organomegaly Skin: Skin color, texture, turgor normal. No rashes or lesions or No jaundice Neurologic: Mental status: Alert, oriented, thought content appropriate Psych: Pleasant  ASSESSMENT & PLAN:    1. Possible HeFH 2. ASCVD with recent two-vessel CABG (02/2017) 3. Strong family history of premature coronary artery disease 4. PBC, with cirrhosis and ongoing jaundice (followed by Dr. [name] at [facility])  Christine Butler has had marked improvement in her lipids after transplantation of her liver.  Is not clear whether she has a genetic dyslipidemia to me at this point or whether we have essentially cured it by providing a normally functioning liver.  She has not required her Praluent and has maintained LDL at target less than 70.  We will continue to monitor this and she may need to start a low-dose statin.  For the time being I think she can continue to stay off of the Praluent since on treatment her lipid numbers were quite low.  COVID-19 Education: The signs and symptoms of COVID-19 were discussed with the patient and how to seek care for testing (follow up with PCP or arrange E-visit).  The importance of social distancing was discussed today.  Patient Risk:   After full review of this patients clinical status, I feel that they are at  least moderate risk at this time.  Time:   Today, I have spent 15 minutes with the patient with telehealth technology discussing dyslipidemia, post transplant, use of Praluent.     Medication Adjustments/Labs and Tests Ordered: Current medicines are reviewed at length with the patient today.  Concerns regarding medicines are outlined above.   Tests Ordered: Orders Placed This Encounter  Procedures  . Lipid panel    Medication Changes: No orders of the defined types were placed in this encounter.   Disposition:  in 6 month(s)  Pixie Casino, MD, [facility], [place] Director of the Advanced Lipid Disorders &  Cardiovascular Risk Reduction Clinic Diplomate of the American Board of Clinical Lipidology Attending Cardiologist  Direct Dial: [phone]  Fax: [phone]  Website:  [email or web address]  Pixie Casino, MD  01/11/2020 8:25 PM

## 2020-01-12 ENCOUNTER — Other Ambulatory Visit (HOSPITAL_COMMUNITY): Payer: Self-pay

## 2020-01-12 ENCOUNTER — Institutional Professional Consult (permissible substitution): Admit: 2020-01-12 | Discharge: 2020-01-12 | Payer: PRIVATE HEALTH INSURANCE

## 2020-01-12 ENCOUNTER — Ambulatory Visit: Admit: 2020-01-12 | Discharge: 2020-01-12 | Payer: PRIVATE HEALTH INSURANCE

## 2020-01-12 DIAGNOSIS — D649 Anemia, unspecified: Principal | ICD-10-CM

## 2020-01-12 DIAGNOSIS — E119 Type 2 diabetes mellitus without complications: Principal | ICD-10-CM

## 2020-01-12 DIAGNOSIS — Z944 Liver transplant status: Principal | ICD-10-CM

## 2020-01-12 DIAGNOSIS — G4709 Other insomnia: Principal | ICD-10-CM

## 2020-01-12 DIAGNOSIS — E785 Hyperlipidemia, unspecified: Principal | ICD-10-CM

## 2020-01-12 DIAGNOSIS — E559 Vitamin D deficiency, unspecified: Principal | ICD-10-CM

## 2020-01-12 DIAGNOSIS — Z79899 Other long term (current) drug therapy: Principal | ICD-10-CM

## 2020-01-12 DIAGNOSIS — Z95828 Presence of other vascular implants and grafts: Principal | ICD-10-CM

## 2020-01-12 DIAGNOSIS — R410 Disorientation, unspecified: Principal | ICD-10-CM

## 2020-01-12 DIAGNOSIS — D849 Immunodeficiency, unspecified: Principal | ICD-10-CM

## 2020-01-12 LAB — IRON PANEL
IRON SATURATION: 61 %
IRON: 130 ug/dL
TOTAL IRON BINDING CAPACITY: 213 ug/dL — ABNORMAL LOW (ref 250–425)

## 2020-01-12 LAB — CBC W/ AUTO DIFF
BASOPHILS ABSOLUTE COUNT: 0 10*9/L (ref 0.0–0.1)
BASOPHILS RELATIVE PERCENT: 1 %
EOSINOPHILS ABSOLUTE COUNT: 0.1 10*9/L (ref 0.0–0.4)
EOSINOPHILS RELATIVE PERCENT: 4.5 %
HEMATOCRIT: 26.4 % — ABNORMAL LOW (ref 36.0–46.0)
HEMOGLOBIN: 9.3 g/dL — ABNORMAL LOW (ref 12.0–16.0)
LARGE UNSTAINED CELLS: 2 % (ref 0–4)
LYMPHOCYTES ABSOLUTE COUNT: 0.7 10*9/L — ABNORMAL LOW (ref 1.5–5.0)
LYMPHOCYTES RELATIVE PERCENT: 29.8 %
MEAN CORPUSCULAR HEMOGLOBIN CONC: 35.2 g/dL (ref 31.0–37.0)
MEAN CORPUSCULAR HEMOGLOBIN: 34.5 pg — ABNORMAL HIGH (ref 26.0–34.0)
MEAN CORPUSCULAR VOLUME: 98.1 fL (ref 80.0–100.0)
MEAN PLATELET VOLUME: 8.2 fL (ref 7.0–10.0)
MONOCYTES ABSOLUTE COUNT: 0.1 10*9/L — ABNORMAL LOW (ref 0.2–0.8)
MONOCYTES RELATIVE PERCENT: 3 %
NEUTROPHILS ABSOLUTE COUNT: 1.5 10*9/L — ABNORMAL LOW (ref 2.0–7.5)
NEUTROPHILS RELATIVE PERCENT: 59.9 %
PLATELET COUNT: 166 10*9/L (ref 150–440)
RED BLOOD CELL COUNT: 2.7 10*12/L — ABNORMAL LOW (ref 4.00–5.20)
RED CELL DISTRIBUTION WIDTH: 16.3 % — ABNORMAL HIGH (ref 12.0–15.0)
WBC ADJUSTED: 2.5 10*9/L — ABNORMAL LOW (ref 4.5–11.0)

## 2020-01-12 LAB — LIPID PANEL
CHOLESTEROL/HDL RATIO SCREEN: 2.9 (ref 1.0–4.5)
CHOLESTEROL: 117 mg/dL (ref <=200)
HDL CHOLESTEROL: 41 mg/dL (ref 40–60)
LDL CHOLESTEROL CALCULATED: 50 mg/dL (ref 40–99)
NON-HDL CHOLESTEROL: 76 mg/dL (ref 70–130)
TRIGLYCERIDES: 131 mg/dL (ref 0–150)
VLDL CHOLESTEROL CAL: 26.2 mg/dL (ref 9–37)

## 2020-01-12 LAB — PHOSPHORUS: PHOSPHORUS: 3.8 mg/dL (ref 2.4–5.1)

## 2020-01-12 LAB — COMPREHENSIVE METABOLIC PANEL
ALBUMIN: 3.4 g/dL (ref 3.4–5.0)
ALKALINE PHOSPHATASE: 89 U/L (ref 46–116)
ALT (SGPT): <7 U/L — ABNORMAL LOW (ref 10–49)
ANION GAP: 9 mmol/L (ref 5–14)
AST (SGOT): 10 U/L (ref <=34)
BILIRUBIN TOTAL: 0.4 mg/dL (ref 0.3–1.2)
BLOOD UREA NITROGEN: 16 mg/dL (ref 9–23)
BUN / CREAT RATIO: 20
CALCIUM: 8.9 mg/dL (ref 8.7–10.4)
CHLORIDE: 108 mmol/L — ABNORMAL HIGH (ref 98–107)
CO2: 23 mmol/L (ref 20.0–31.0)
CREATININE: 0.8 mg/dL
EGFR CKD-EPI AA FEMALE: >90 mL/min/{1.73_m2} (ref >=60)
EGFR CKD-EPI NON-AA FEMALE: 87 mL/min/{1.73_m2} (ref >=60)
GLUCOSE RANDOM: 98 mg/dL (ref 70–179)
POTASSIUM: 3.7 mmol/L (ref 3.5–5.1)
PROTEIN TOTAL: 5.8 g/dL (ref 5.7–8.2)
SODIUM: 140 mmol/L (ref 135–145)

## 2020-01-12 LAB — SLIDE REVIEW

## 2020-01-12 LAB — MAGNESIUM: MAGNESIUM: 1.4 mg/dL — ABNORMAL LOW (ref 1.6–2.6)

## 2020-01-12 LAB — BILIRUBIN, DIRECT: BILIRUBIN DIRECT: 0.2 mg/dL (ref 0.00–0.30)

## 2020-01-12 LAB — HEMOGLOBIN A1C: HEMOGLOBIN A1C: <4 % — ABNORMAL LOW (ref 4.8–5.6)

## 2020-01-12 LAB — TACROLIMUS LEVEL, TROUGH: TACROLIMUS, TROUGH: 4.3 ng/mL — ABNORMAL LOW (ref 5.0–15.0)

## 2020-01-12 LAB — GAMMA GT: GAMMA GLUTAMYL TRANSFERASE: 29 U/L

## 2020-01-12 MED ORDER — MIRTAZAPINE 15 MG TABLET
ORAL_TABLET | Freq: Every evening | ORAL | 2 refills | 30 days | Status: CP
Start: 2020-01-12 — End: 2020-03-12

## 2020-01-12 MED ORDER — TACROLIMUS XR 4 MG TABLET,EXTENDED RELEASE 24 HR
ORAL_TABLET | Freq: Every day | ORAL | 11 refills | 30.00000 days | Status: CP
Start: 2020-01-12 — End: 2020-01-18

## 2020-01-12 MED ORDER — PREDNISONE 5 MG TABLET
ORAL_TABLET | Freq: Every day | ORAL | 11 refills | 30 days | Status: CP
Start: 2020-01-12 — End: 2021-01-11

## 2020-01-12 MED ORDER — OMEPRAZOLE 40 MG CAPSULE,DELAYED RELEASE
ORAL_CAPSULE | Freq: Every day | ORAL | 5 refills | 30 days | Status: CP
Start: 2020-01-12 — End: 2021-01-11

## 2020-01-12 MED ADMIN — pentamidine (PENTAM) inhalation solution: 300 mg | RESPIRATORY_TRACT | @ 20:00:00 | Stop: 2020-01-12

## 2020-01-12 MED ADMIN — heparin, porcine (PF) 100 unit/mL injection 500 Units: 500 [IU] | INTRAVENOUS | @ 15:00:00 | Stop: 2020-01-12

## 2020-01-12 MED ADMIN — albuterol 2.5 mg /3 mL (0.083 %) nebulizer solution 2.5 mg: 2.5 mg | RESPIRATORY_TRACT | @ 18:00:00 | Stop: 2020-01-12

## 2020-01-12 MED FILL — predniSONE 5 MG TABS: 5 | 30 days supply | Qty: 30 | Fill #0

## 2020-01-12 MED FILL — SULFAMETHOXAZOLE-TMP SS TAB: 400-80 | 28 days supply | Qty: 12 | Fill #0

## 2020-01-12 NOTE — Unmapped (Signed)
Received message from patient:  ----- Message -----       From:Kathy Armstrong       Sent:01/10/2020  1:54 PM EST         Kathy Armstrong    Subject:Leave for work     [employer] ! My leave from work ends next week on the 24th. I was wondering if I could get an  extension as I???m not ready to go back yet . I am behind in physical therapy due to my extended stay in the hospital and it was weeks after I got home when I finally got the referral and was able to schedule it . 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 I still feel weak from lack of eating and loosing so much weight . My appetite has increased over the past few weeks though.  Also, I don???t feel comfortable with my immune system yet especially during cold and flu season plus all the covid patients with care for . 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 I know PT is looking to have me physically ready in January. I have some papers that need to be filled out again for an   extension . I will fax them to you if that is okay ! I???ll see you Thursday !    Kathy Armstrong    Received the fax yesterday afternoon with the disability paperwork and saw pt's message today.     Sent message to Cecilie Lowers, PharmD:   Cleone Slim.     Her envarsus level dropped in right after 4pm today from 11/15 and the level is still the same at 4.2 as last week; she was increased to 6mg  last Thursday.     She will be in clinic tomorrow.     I sent a mychart for her to bring extra envarsus in case the dose is increased unless we will wait for tomorrow's level.    Thanks,  [employer] message to patient:  Hi.   I received the fax late yesterday afternoon and saw your message today.   Thanks for the information you shared in you message. We will work on this tomorrow when you are in clinic.   Share what you did in this message with Dr. Matilde Haymaker so he knows what you do.  Then he will need to say the dates to extend and re-evaluate.     Bring extra envarsus with you tomorrow. Your level just resulted a few minutes ago at the same level. Since you are on envarsus which is only in the morning, the change would not occur until tomorrow anyway. I have sent a message to Vernona Rieger with pharmacy and if she increases the dose, you will have it with you tomorrow.     See you tomorrow,  [name]

## 2020-01-12 NOTE — Unmapped (Signed)
[facility] HOSPITALS TRANSPLANT CLINIC PHARMACY NOTE  01/11/2020   Kathy Armstrong  [id number]    Medication changes today:   1. Decrease omeprazole to once daily  2. Stop melatonin  3. Increase Envarsus to 8 mg     Education/Adherence tools provided today:  1.provided updated medication list  2.  provided additional education on immunosuppression and transplant related medications including reviewing indications of medications, dosing and side effects    Follow up items:  1. goal of understanding indications and dosing of immunosuppression medications  2. Consider rosuvastatin or atorvastatin at future visit (CAD s/p CABG history); patient also followed closely by cardiologist who elected to hold all cholesterol medications until 6 months post-transplant  3. Assess heartburn  4. Encourage patient to schedule Reclast  5. Need 6 month visit Vit D level    Next visit with pharmacy in 1-3 months  ____________________________________________________________________    Kathy Armstrong is a 49 y.o. 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     Other PMH significant for HFpEF, CAD s/p CABG x2 (2019), HLD (familial)    Post op complicated by: catatonia w/psychiatry consult.    Admitted 10/5-10/8 w/E Coli UTI.  She was treated w/CTX -> cephalexin.    Interval history: Admitted 10/12-10/16 w/epigastric pain found to have pancreatitis w/biliary stricture.  ERCP was completed on 10/14 which showed biliary stricture and stent was placed.    Seen by pharmacy today for: medication management and pill box fill and adherence education; last seen by pharmacy 2 weeks ago     CC:  Patient has no complaints today     There were no vitals filed for this visit.    Allergies   Allergen Reactions   ??? Codeine Nausea And Vomiting   ??? Erythromycin Nausea And Vomiting   ??? Fentanyl Nausea And Vomiting       Medications reviewed in EPIC medication station and updated today by the clinical pharmacist practitioner.    Outpatient Encounter Medications as of 01/12/2020   Medication Sig Dispense Refill   ??? acetaminophen (TYLENOL) 325 MG tablet Take 2 tablets (650 mg total) by mouth every six (6) hours as needed for pain or fever (> 100.4 F). 100 tablet 0   ??? acetaminophen (TYLENOL) 500 MG tablet Take 1,000 mg by mouth every eight (8) hours as needed.     ??? alirocumab (PRALUENT PEN) 150 mg/mL subcutaneous injection Inject 150 mg under the skin every fourteen (14) days.     ??? aspirin (ASPIR-81) 81 MG tablet Take 1 tablet (81 mg total) by mouth daily. 100 tablet 10   ??? carvediloL (COREG) 25 MG tablet Take 1.5 tablets (37.5 mg total) by mouth Two (2) times a day. 90 tablet 11   ??? ergocalciferol-1,250 mcg, 50,000 unit, (DRISDOL) 1,250 mcg (50,000 unit) capsule Take 1 capsule (1,250 mcg total) by mouth once a week. 8 capsule 0   ??? fluticasone propionate (FLONASE) 50 mcg/actuation nasal spray Use 1 spray into each nostril daily as needed for rhinitis. 16 g 5   ??? loperamide (IMODIUM) 2 mg capsule Take 2 capsules (4 mg total) by mouth Three (3) times a day as needed for diarrhea. 100 capsule 5   ??? loratadine (CLARITIN) 10 mg tablet Take 10 mg by mouth daily.     ??? magnesium oxide-Mg AA chelate (MAGNESIUM, AMINO ACID CHELATE,) 133 mg Tab Take 1 tablet by mouth Two (2) times a day. 100 tablet 11   ??? melatonin 3 mg Tab  Take 1 tablet (3 mg total) by mouth every evening. 100 tablet 3   ??? mirtazapine (REMERON) 7.5 MG tablet Take 1 tablet (7.5 mg total) by mouth nightly. (Patient taking differently: Take 15 mg by mouth nightly. ) 30 tablet 6   ??? multivit with min #[id number] (DEKAS PLUS, FOLIC ACID,) 200 mcg-1,000 ZOX-09 mg cap Take 1 tablet by mouth. Take 1 tablet by mouth daily     ??? mycophenolate (MYFORTIC) 180 MG EC tablet Take 1 tablet (180 mg total) by mouth Two (2) times a day. 60 tablet 11   ??? NON FORMULARY Take 1,000 mg by mouth two (2) times a day. Vitafusion Calcium: Take two gummies by mouth twice daily     ??? omeprazole (PRILOSEC) 40 MG capsule Take 1 capsule (40 mg total) by mouth two (2) times a day. 60 capsule 5   ??? ondansetron (ZOFRAN-ODT) 8 MG disintegrating tablet Dissolve 1 tablet (8 mg total) by mouth every eight (8) hours as needed. 30 tablet 1   ??? oxyCODONE (OXY-IR) 5 mg capsule Take 5 mg by mouth daily as needed for pain.     ??? pentamidine in sterile water (PF) Inhale 6 mL (300 mg total) every twenty-eight (28) days. 6 mL 5   ??? predniSONE (DELTASONE) 5 MG tablet Take 1 tablet (5 mg total) by mouth daily. 30 tablet 11   ??? tacrolimus (ENVARSUS XR) 1 mg Tb24 extended release tablet Take one 4mg  tablet in addition to two 1mg  tablets for total once daily dosing of 6mg  60 tablet 11   ??? tacrolimus (ENVARSUS XR) 4 mg Tb24 extended release tablet Take one 4mg  tablet in addition to two 1mg  tablets for total once daily dosing of 6mg  30 tablet 11   ??? ursodioL (ACTIGALL) 300 mg capsule Take 1 capsule (300 mg total) by mouth Three (3) times a day. 90 capsule 11   ??? valGANciclovir (VALCYTE) 450 mg tablet Take 1 tablet (450 mg total) by mouth daily. 30 tablet 2     Facility-Administered Encounter Medications as of 01/12/2020   Medication Dose Route Frequency Provider Last Rate Last Admin   ??? albuterol 2.5 mg /3 mL (0.083 %) nebulizer solution 2.5 mg  2.5 mg Nebulization Once PRN Loney Hering, MD       ??? pentamidine (PENTAM) inhalation solution  300 mg Inhalation Once Loney Hering, MD         Induction agent : basiliximab    CURRENT IMMUNOSUPPRESSION:   Envarsus 6 mg PO bid  Envarsus goal: 8-10   myfortic180  mg PO bid (decreased for GI intolerance)   prednisone 5 mg daily (per PBC protocol)    Patient is tolerating immunosuppression well-  Improved headaches on Envarsus    IMMUNOSUPPRESSION DRUG LEVELS:  Lab Results   Component Value Date    Tacrolimus, Trough 6.5 12/22/2019    Tacrolimus, Trough 6.4 12/10/2019    Tacrolimus, Trough 13.9 12/09/2019    Tacrolimus Lvl 4.2 01/09/2020    Tacrolimus Lvl 4.2 01/02/2020    Tacrolimus Lvl 9.7 12/29/2019     No results found for: CYCLO  No results found for: EVEROLIMUS  No results found for: SIROLIMUS    Prograf level is accurate 12 hour trough    Graft function: improving post-stent in OCt  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  Increase Envarsus to 8 mg daily. Consider increasing myfortic further if GI tolerance continues to improve. Continue to monitor.    OI Prophylaxis:   CMV Status: D+/ R+, moderate risk . CMV prophylaxis: valganciclovir 450 mg daily x 3 months per protocol. (stopping 01/18/20)  Estimated Creatinine Clearance: 70.8 mL/min (based on SCr of 0.76 mg/dL).  No results found for: CMVCP  PCP Prophylaxis: pentamidine 300 mg inhalation monthly (last dose 10/14, sched for today) x 6 months (switched from Bactrim 2/2 hyperkalemia)  Thrush:  completed in hospital  Patient is  tolerating infectious prophylaxis well    Plan: Pentamidine today,  then transition to Bactrim in December (02/10/20). Continue to monitor.    CAD s/p CABG: asa 81 mg   The 10-year ASCVD risk score Denman George DC Jr., et al., 2013) is: 6.7%  Statin therapy: Indicated; currently on no statin, was on alirocumab pre transplant (lipid specialist had avoided statin 2/2 ESLD) and has not resumed post-transplant  Plan: At next visit consider starting rosuvastatin or atorvastatin given CAD history. Cardiologist lipid specialist electing to wait another 3 months for resuming cholesterol medications. Patient qualifies for lifelong high intensity statin due to history of clinical ASCVD event (CABG). Resume at 6 month visit. Continue to monitor.    BP: Goal < 140/90. Clinic vitals reported above  Home BP ranges: 110-120/60-70s, HRs high 60s-low 70s  Current meds include: carvedilol 37.5 mg BID  Plan: within goal, continue to monitor.    Anemia:  H/H:   Lab Results   Component Value Date    HGB 9.6 (L) 01/09/2020     Lab Results   Component Value Date    HCT 28.3 (L) 01/09/2020     Iron panel:  Lab Results   Component Value Date    IRON 138 11/21/2019    TIBC 227 (L) 11/21/2019    FERRITIN 420.1 (H) 12/22/2019     Lab Results   Component Value Date    Iron Saturation (%) 61 11/21/2019     Prior ESA use: none post transplant    Plan: below goal but stable. Continue to monitor.     DM:   Lab Results   Component Value Date    A1C <3.8 (L) 10/17/2019   . Goal A1c < 7  History of Dm? No  Plan:  Continue to monitor    Fluid intake: 60 oz free water daily + some Gatorade  Plan: Increase fluid intake as able - Gatorade ok for now as K+ <4    Electrolytes: wnl, Na slightly high  Meds currently on: Mg plus protein 133 mg BID  Plan: Continue to monitor     GI/BM: Pt reports improved reflux and  nausea; appetite improving but still has taste aversions  Meds currently on: loperamide 2 mg TID PRN (~2x/week), Dekas Plus 1 tablet daily, omeprazole 40 mg BID, ondansetron 4 mg BID PRN  (hasn't used in past week, needing less), mirtazepine 15 hs  Plan:  Psych following - recently increased mirtazepine to 15 mg this week. Will decrease omeprazole to once daily for heartburn. Continue to monitor.     PBC  Meds currently on: urosdiol 300 mg TID  Plan: Continue to monitor    Psych/insomnia - Sleeping enough hours, not feeling rested  Meds currently on: mirtazepine 15 mg hs, melatonin 3 mg HS  Plan: Defer to psychiatry. Patient no longer using melatonin, will plan to discontinue.    Pain: pt reports improved pain with headaches  Meds currently on: APAP 500 mg PRN (1000 mg PRN headaches, maybe 1-2x/week)  Plan: Continue to monitor    Bone health:   Vitamin D Level: last level is 15.4 (11/21/19). Goal > 30.   Last DEXA results:  04/07/19 osteoporosis of lumbar spine and hip)  Current meds include: Reclast 5 mg q12 months (due for nect dose), Vitafusion calcium 2g BID (800 mg elemental calcium), ergocalciferol 50,000 units weekly x8 weeks (first dose 10/4, now finished)  Plan: Patient's vitamin D level needs to be drawn with next lab schedule following ergocalciferol therapy. Current Scr okay to schedule annual Reclast appointment. Continue to monitor.     Women's/Men's Health:  Cristle Jared is a 49 y.o. Female perimenopausal. Patient reports no men's/women's health issues  Plan: Continue to monitor    Pharmacy Preference:  [facility] for tacrolimus, Myfortic, and Valcyte; all other meds from [facility]    Medication Access Issues:  -Refill mirtazepine to [facility], prednisone    Adherence: Patient has average understanding of medications; was able to independently identify names/doses of immunosuppressants and OI meds.  Patient  does fill their own pill box on a regular basis at home.  Patient brought medication card:yes  Pill box: brought one day - only 25 mg of carvedilol in PM slot  Plan: Provided moderate adherence counseling/intervention    I spent a total of 25 minutes face to face with the patient delivering clinical care and providing education/counseling. Patient was reviewed with Gertie Fey, DNP who was agreement with the stated plan:     During this visit, the following was completed:   BG log data assessment  BP log data assessment  Labs ordered and evaluated  complex treatment plan >1 DS     All questions/concerns were addressed to the patient's satisfaction.  __________________________________________  Lovena Le, PharmD  PGY2 Solid Organ Transplant Resident    Cleone Slim, PHARMD, CPP  SOLID ORGAN TRANSPLANT CLINICAL PHARMACIST PRACTITIONER  PAGER [phone]

## 2020-01-12 NOTE — Unmapped (Signed)
Please see pharmacy visit for additional charge information.

## 2020-01-12 NOTE — Unmapped (Signed)
Per providers orders, Albuterol and Pentamidine treatments were administered.  Patient tolerated it well with no complication.  See MAR for administration info.

## 2020-01-12 NOTE — Unmapped (Signed)
Patient's port-a-cath accessed in clinic today for lab draw with brisk blood return noted. Labs collected and sent, port flushed with 20 mLs normal saline and heparin locked per protocol. Please see flowsheets for details. Patient tolerated well with no complications/complaints.

## 2020-01-13 ENCOUNTER — Telehealth: Admit: 2020-01-13 | Discharge: 2020-01-14 | Payer: PRIVATE HEALTH INSURANCE

## 2020-01-13 ENCOUNTER — Telehealth
Admit: 2020-01-13 | Discharge: 2020-01-14 | Payer: PRIVATE HEALTH INSURANCE | Attending: Nutritionist | Primary: Nutritionist

## 2020-01-13 DIAGNOSIS — Z4823 Encounter for aftercare following liver transplant: Principal | ICD-10-CM

## 2020-01-13 DIAGNOSIS — Z944 Liver transplant status: Principal | ICD-10-CM

## 2020-01-13 NOTE — Unmapped (Signed)
**  THIS PATIENT WAS NOT SEEN IN PERSON TO MINIMIZE POTENTIAL SPREAD OF COVID-19, PROTECT PATIENTS/PROVIDERS, AND REDUCE PPE UTILIZATION.**      I spent 26 minutes on the phone with the patient on the date of service.      The patient was physically located in [place] or a state in which I am permitted to provide care. The patient and/or parent/guardian understood that s/he may incur co-pays and cost sharing, and agreed to the telemedicine visit. The visit was reasonable and appropriate under the circumstances given the patient's presentation at the time.    The patient and/or parent/guardian has been advised of the potential risks and limitations of this mode of treatment (including, but not limited to, the absence of in-person examination) and has agreed to be treated using telemedicine. The patient's/patient's family's questions regarding telemedicine have been answered.     If the visit was completed in an ambulatory setting, the patient and/or parent/guardian has also been advised to contact their provider???s office for worsening conditions, and seek emergency medical treatment and/or call 911 if the patient deems either necessary.      Call to patient for 3 month post liver psychosocial.    She reports stable, housing and transportation.     She shares that her mood is stable.     Appetite has finally improved and taste is getting better. Some days she feels like she can't get full.     Repots her sleep is adequate.     She enjoyed several hikes over the past few weeks - one in [place] and one in [place] area. She was thrilled that she didn't even need to sleep afterwards.     Her sister/caregiver is heading back home tomorrow. She feels that it is bittersweet.     She shares that she received her SSDI and was informed that she cannot return to work until at least 6 months post. 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 We discussed a balance and possibly returning part-time at first.     She shared that her donor family made contact with her via Facebook. She shares that it has been a healthy exchange/communication thus far and she feels grateful that she has been able to ease their suffering by knowing their gift of donation has helped improve the life of someone.     Reminded her that CSW is available to her at anytime for resources and/or questions. She was appreciative of discussion.     Carole Binning, LCSW-A  Transplant Case Manager  01/13/2020. 9:34 AM

## 2020-01-13 NOTE — Unmapped (Signed)
Due to pt's 01/12/20 tac level of 4.3, Alyson Prom PharmD and Cecilie Lowers, PharmD mentioned for patient to increase Envarsus to 8mg  daily.     Talked with patient tonight who verbalized understanding to increase Envarsus to 8mg  daily starting tomorrow; aware we will send script to [facility] to take 2 of the 4mg  tablets once a day.

## 2020-01-13 NOTE — Unmapped (Signed)
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 female seen for medical nutrition therapy for 3 month follow up. Her active problem list, medication list and notes from last encounter were reviewed.     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    Anthropometrics   Estimated body mass index is 21.81 kg/m?? as calculated from the following:    Height as of 01/12/20: 157.5 cm (5' 2.01).    Weight as of 01/12/20: 54.1 kg (119 lb 4.8 oz).    Wt Readings from Last 5 Encounters:   01/12/20 54.1 kg (119 lb 4.8 oz)   12/22/19 56.2 kg (123 lb 14.4 oz)   12/08/19 57.6 kg (127 lb)   12/06/19 58 kg (127 lb 13.9 oz)   12/06/19 58 kg (127 lb 13.9 oz)      Usual body weight:~135- 140 lbs prior to transplant ; lowest weight 116 lbs is now gaining some weight over the past 2 weeks   Ideal Body Weight:   48.74 kg     Nutrition Risk Screening:     Nutrition Focused Physical Exam:   Unable to assess given virtual visit     Malnutrition Screening:   Malnutrition Assessment using AND/ASPEN Clinical Characteristics:   Unable to assess given virtual visit. Patient does not meet intake criteria     Biochemical Data, Medical Tests and Procedures:  All pertinent labs and imaging reviewed by Lanelle Bal at 9:36 AM 01/13/2020.    ANC= 1.5- 1.9    Lab Results   Component Value Date    A1C <4.0 (L) 01/12/2020    A1C <3.8 (L) 10/17/2019    A1C <3.8 (L) 10/14/2019    GLU 98 01/12/2020    and   Lab Results   Component Value Date    BUN 16 01/12/2020    CREATININE 0.80 01/12/2020    NA 140 01/12/2020    K 3.7 01/12/2020    CL 108 (H) 01/12/2020    CO2 23.0 01/12/2020    CALCIUM 8.9 01/12/2020    PHOS 3.8 01/12/2020    ALBUMIN 3.4 01/12/2020       Medications and Vitamin/Mineral Supplementation:   All nutritionally pertinent medications reviewed on 01/13/2020.   Nutritionally pertinent medications include: Imodium, Remeron, Prilosec, Zofran, Miralax, prednisone,  ursodiol  She is taking nutrition supplements.  magnesium oxide, multivitamin (DEKAS)    Current Outpatient Medications   Medication Sig Dispense Refill   ??? acetaminophen (TYLENOL) 325 MG tablet Take 2 tablets (650 mg total) by mouth every six (6) hours as needed for pain or fever (> 100.4 F). 100 tablet 0   ??? acetaminophen (TYLENOL) 500 MG tablet Take 1,000 mg by mouth every eight (8) hours as needed.     ??? aspirin (ASPIR-81) 81 MG tablet Take 1 tablet (81 mg total) by mouth daily. 100 tablet 10   ??? carvediloL (COREG) 25 MG tablet Take 1.5 tablets (37.5 mg total) by mouth Two (2) times a day. 90 tablet 11   ??? ergocalciferol-1,250 mcg, 50,000 unit, (DRISDOL) 1,250 mcg (50,000 unit) capsule Take 1 capsule (1,250 mcg total) by mouth once  a week. 8 capsule 0   ??? fluticasone propionate (FLONASE) 50 mcg/actuation nasal spray Use 1 spray into each nostril daily as needed for rhinitis. 16 g 5   ??? loperamide (IMODIUM) 2 mg capsule Take 2 capsules (4 mg total) by mouth Three (3) times a day as needed for diarrhea. 100 capsule 5   ??? loratadine (CLARITIN) 10 mg tablet Take 10 mg by mouth daily.     ??? magnesium oxide-Mg AA chelate (MAGNESIUM, AMINO ACID CHELATE,) 133 mg Tab Take 1 tablet by mouth Two (2) times a day. 100 tablet 11   ??? mirtazapine (REMERON) 15 MG tablet Take 1 tablet (15 mg total) by mouth nightly. 30 tablet 2   ??? multivit with min #[id number] (DEKAS PLUS, FOLIC ACID,) 200 mcg-1,000 ZOX-09 mg cap Take 1 tablet by mouth. Take 1 tablet by mouth daily     ??? mycophenolate (MYFORTIC) 180 MG EC tablet Take 1 tablet (180 mg total) by mouth Two (2) times a day. 60 tablet 11   ??? NON FORMULARY Take 1,000 mg by mouth two (2) times a day. Vitafusion Calcium: Take two gummies by mouth twice daily     ??? omeprazole (PRILOSEC) 40 MG capsule Take 1 capsule (40 mg total) by mouth daily. 30 capsule 5   ??? ondansetron (ZOFRAN-ODT) 8 MG disintegrating tablet Dissolve 1 tablet (8 mg total) by mouth every eight (8) hours as needed. 30 tablet 1   ??? pentamidine in sterile water (PF) Inhale 6 mL (300 mg total) every twenty-eight (28) days. 6 mL 5   ??? predniSONE (DELTASONE) 5 MG tablet Take 1 tablet (5 mg total) by mouth daily. 30 tablet 11   ??? [START ON 02/10/2020] sulfamethoxazole-trimethoprim (BACTRIM) 400-80 mg per tablet Take 1 tablet (80 mg of trimethoprim total) by mouth Every Monday, Wednesday, and Friday. 12 tablet 2   ??? tacrolimus (ENVARSUS XR) 4 mg Tb24 extended release tablet Take 2 tablets (8 mg total) by mouth daily. 60 tablet 11   ??? ursodioL (ACTIGALL) 300 mg capsule Take 1 capsule (300 mg total) by mouth Three (3) times a day. 90 capsule 11   ??? valGANciclovir (VALCYTE) 450 mg tablet Take 1 tablet (450 mg total) by mouth daily. 30 tablet 2     No current facility-administered medications for this visit.     Facility-Administered Medications Ordered in Other Visits   Medication Dose Route Frequency Provider Last Rate Last Admin   ??? albuterol 2.5 mg /3 mL (0.083 %) nebulizer solution 2.5 mg  2.5 mg Nebulization Once PRN Loney Hering, MD       ??? pentamidine (PENTAM) inhalation solution  300 mg Inhalation Once Loney Hering, MD           Nutrition History:     Dietary Restrictions: No known food allergies or food intolerances.     Gastrointestinal Issues: Patient reports that her taste buds are still altered, and has to increase seasoning to make foods more agreeable. Patient reports most days she has ~1-2 formed stools per day, but very rarely she will have diarrhea will go 3-4 loose stools     Hunger and Satiety: Early satiety. Much improved since last visit. Patient reports feeling hungry most of the time for the past 2 weeks, as been eating much more food as a result. Food Safety and Access: No to little issues noted.     Diet Recall:   Time Intake   Breakfast Grits with cheese    Snack (AM) Clear premier  Protein + fruit (made into popsicle)   Lunch cheese whiz toast normally, yesterday did have a modified thanksgiving dinner with chicken    Snack (PM) Apple with chia seeds    Dinner Hamburger or 2 slices pizza with roast brussel sprouts and carrots    Snack (HS) Watermelon (2 large slices) or lays chips     Food-Related History:   Beverages:  Water (has been working on staying hydrated), she is now up to 60 fl oz per day   Dining Out:  Not since before transplant       Physical Activity:  Physical activity level is light with some exercise. Patient reports walking daily and still does PT a few times per week. She reports going hiking 2x last week.      Daily Estimated Nutrient Needs:  Energy: 1610-9604 kcals [30-35 kcal/kg using usual body weight, 68 kg]  Protein: 74-98 gm [1.5-2.0 gm/kg using ideal body weight, 49 kg]  Fluid: [per MD team]    Nutrition Goals & Evaluation      1. Meet estimated daily needs (Progressing)  2. Normal vitamin levels (Progressing)  3. Balanced macronutrient intake (Progressing)    Nutrition goals reviewed, and relevant barriers identified and addressed: none evident. She is evaluated to have fair willingness and ability to achieve nutrition goals.     Nutrition Assessment     Per patient report she has been eating much more since last RD visit. She also reports having a large appetite and she feels hungry most of the time. Patient with ~3 lb weight gain from her post transplant low, so anticipate her current level of intake is meeting her estimated nutrition needs.   She also reports her diarrhea has almost completely resolved, and now as normal consistency stool. Patient is working on increasing her water intake to 75 fl oz, she reports she gets ~60 fl oz in regularly now. Encouraged patient to continue food safety precautions given decreased absolute neutrophil count.     Nutrition Intervention      Nutrition Education: continue food safety precautions, high calorie foods/snacks     Materials Provided were: Verbal tips and recommendations    Nutrition Plan:   ??? Continue food safety precautions   ??? Continue to follow hunger/satiety cues   ??? Space meals out every 3-4 hours while awake  ??? Exercise 30 minutes, 5 days per week and continue with physical therapy   ??? Continue multivitamin daily     Follow up will occur in 3 months.     Food/Nutrition-related history and Patient understanding or compliance with intervention and recommendations  will be assessed at time of follow-up.         Recommendations for Care Team:  Monitor appetite and weight, if no weight gain please schedule follow up appointment earlier            I spent 14 minutes on the real-time audio and video with the patient on the date of service. I spent an additional 18 minutes on pre- and post-visit activities on the date of service.     The patient was physically located in [place] or a state in which I am permitted to provide care. The patient and/or parent/guardian understood that s/he may incur co-pays and cost sharing, and agreed to the telemedicine visit. The visit was reasonable and appropriate under the circumstances given the patient's presentation at the time.    The patient and/or parent/guardian has been advised of the potential risks and  limitations of this mode of treatment (including, but not limited to, the absence of in-person examination) and has agreed to be treated using telemedicine. The patient's/patient's family's questions regarding telemedicine have been answered.     If the visit was completed in an ambulatory setting, the patient and/or parent/guardian has also been advised to contact their provider???s office for worsening conditions, and seek emergency medical treatment and/or call 911 if the patient deems either necessary.  I am located on-site and the patient is located off-site for this visit.        Lanelle Bal, RD, LDN  Abdominal Transplant Dietitian   Pager: [phone]

## 2020-01-14 NOTE — Unmapped (Signed)
Patient seen in clinic with Gertie Fey, NP. Reports doing much better; has done 2 hikes.   Patient mentioned her provider, Dr. Rennis Golden, cardiology lipid specialist, has stopped her cholesterol medications and rechecking in 6 months. Lovena Le, PharmD and Cecilie Lowers, PharmD mentioned we can check her lipid panel at 6 months as deferring to Dr. Rennis Golden.   Patient mentioned psychiatry increased her remeron to 15mg .   Took Envarsus at 7:30am and gets to lab by 8am but typically unable to draw her labs until later. Tac pending today.  Recently had her first formed stool.   Reviewed labs; her magnesium 1.4 and confirmed she continues to take her magnesium and no missed doses.   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   Patient aware we will fill out disability paperwork.   Pt's sister goes back to [place] on Saturday; if something occurs that patient needs assistance, her ex-husband is available as she reports having a good relationship with him.     Reviewed and educated patient 25 minutes about post transplant material that included when to contact the primary coordinator vs the on call coordinator, importance of compliance in taking immunosuppressive meds, OTC meds which are safe(provided pt with list of meds), routine and preventative care, re-establishing care with PCP to manage cholesterol, vitamin D, BP, and health maintenance; pt with booklet she has contains all this information including phone numbers for other multidisciplinary team members in case needed.  Within this, discussed making PCP appointment to discuss non-txp medications and that PCP can send script to [facility] pharmacy for refills for the non-txp medications. Discussed the txp medications that we will always manage.

## 2020-01-16 ENCOUNTER — Ambulatory Visit: Payer: 59 | Admitting: Physical Therapy

## 2020-01-16 DIAGNOSIS — Z79899 Other long term (current) drug therapy: Principal | ICD-10-CM

## 2020-01-16 DIAGNOSIS — Z944 Liver transplant status: Principal | ICD-10-CM

## 2020-01-16 LAB — VITAMIN D 25 HYDROXY: VITAMIN D, TOTAL (25OH): 36 ng/mL (ref 20.0–80.0)

## 2020-01-16 NOTE — Unmapped (Signed)
Gertie Fey, NP reviewed pt's disability paperwork and added more information with intermittent section and signed this.     Talked with patient who confirmed employer as [employer]; aware we are faxing disability paperwork with re-evaluating in 3 months in Feb when she returns.     Successfully manually faxed disability paperwork to [employer] that also included Julia's clinic note from 01/12/20.     Patient mentioned having a runny nose and not feeling well; discussed hydrating well and referring to post education booklet for OTC medications as a guide. Verbalized understanding that if more symptoms appear or continues to not feel well tomorrow that she will need to be evaluated for COVID; also if cold like symptoms worsen or linger for her to follow up with PCP.

## 2020-01-17 DIAGNOSIS — Z944 Liver transplant status: Principal | ICD-10-CM

## 2020-01-17 DIAGNOSIS — Z79899 Other long term (current) drug therapy: Principal | ICD-10-CM

## 2020-01-17 DIAGNOSIS — J069 Acute upper respiratory infection, unspecified: Secondary | ICD-10-CM | POA: Diagnosis not present

## 2020-01-17 DIAGNOSIS — R509 Fever, unspecified: Secondary | ICD-10-CM | POA: Diagnosis not present

## 2020-01-17 LAB — CBC W/ DIFFERENTIAL
BANDED NEUTROPHILS ABSOLUTE COUNT: 0.1 10*3/uL (ref 0.0–0.1)
BASOPHILS ABSOLUTE COUNT: 0 10*3/uL (ref 0.0–0.2)
BASOPHILS RELATIVE PERCENT: 1 %
EOSINOPHILS ABSOLUTE COUNT: 0.1 10*3/uL (ref 0.0–0.4)
EOSINOPHILS RELATIVE PERCENT: 3 %
HEMATOCRIT: 25.3 % — ABNORMAL LOW (ref 34.0–46.6)
HEMOGLOBIN: 9 g/dL — ABNORMAL LOW (ref 11.1–15.9)
IMMATURE GRANULOCYTES: 3 %
LYMPHOCYTES ABSOLUTE COUNT: 0.5 10*3/uL — ABNORMAL LOW (ref 0.7–3.1)
LYMPHOCYTES RELATIVE PERCENT: 17 %
MEAN CORPUSCULAR HEMOGLOBIN CONC: 35.6 g/dL (ref 31.5–35.7)
MEAN CORPUSCULAR HEMOGLOBIN: 34.7 pg — ABNORMAL HIGH (ref 26.6–33.0)
MEAN CORPUSCULAR VOLUME: 98 fL — ABNORMAL HIGH (ref 79–97)
MONOCYTES ABSOLUTE COUNT: 0.3 10*3/uL (ref 0.1–0.9)
MONOCYTES RELATIVE PERCENT: 11 %
NEUTROPHILS ABSOLUTE COUNT: 2 10*3/uL (ref 1.4–7.0)
NEUTROPHILS RELATIVE PERCENT: 65 %
PLATELET COUNT: 113 10*3/uL — ABNORMAL LOW (ref 150–450)
RED BLOOD CELL COUNT: 2.59 x10E6/uL — CL (ref 3.77–5.28)
RED CELL DISTRIBUTION WIDTH: 15 % (ref 11.7–15.4)
WHITE BLOOD CELL COUNT: 3 10*3/uL — ABNORMAL LOW (ref 3.4–10.8)

## 2020-01-17 LAB — COMPREHENSIVE METABOLIC PANEL
A/G RATIO: 1.9 (ref 1.2–2.2)
ALBUMIN: 3.8 g/dL (ref 3.8–4.8)
ALKALINE PHOSPHATASE: 99 IU/L (ref 44–121)
ALT (SGPT): 9 IU/L (ref 0–32)
AST (SGOT): 12 IU/L (ref 0–40)
BILIRUBIN TOTAL: 0.5 mg/dL (ref 0.0–1.2)
BLOOD UREA NITROGEN: 10 mg/dL (ref 6–24)
BUN / CREAT RATIO: 13 (ref 9–23)
CALCIUM: 9 mg/dL (ref 8.7–10.2)
CHLORIDE: 108 mmol/L — ABNORMAL HIGH (ref 96–106)
CO2: 20 mmol/L (ref 20–29)
CREATININE: 0.75 mg/dL (ref 0.57–1.00)
GLOBULIN, TOTAL: 2 g/dL (ref 1.5–4.5)
GLUCOSE: 92 mg/dL (ref 65–99)
POTASSIUM: 3.9 mmol/L (ref 3.5–5.2)
SODIUM: 141 mmol/L (ref 134–144)
TOTAL PROTEIN: 5.8 g/dL — ABNORMAL LOW (ref 6.0–8.5)

## 2020-01-17 LAB — PHOSPHORUS: PHOSPHORUS, SERUM: 3.2 mg/dL (ref 3.0–4.3)

## 2020-01-17 LAB — GAMMA GT: GAMMA GLUTAMYL TRANSFERASE: 26 IU/L (ref 0–60)

## 2020-01-17 LAB — BILIRUBIN, DIRECT: BILIRUBIN DIRECT: 0.2 mg/dL (ref 0.00–0.40)

## 2020-01-17 LAB — MAGNESIUM: MAGNESIUM: 1.3 mg/dL — ABNORMAL LOW (ref 1.6–2.3)

## 2020-01-17 NOTE — Unmapped (Signed)
Received message from patient:  ----- Message -----  From: Kathy Armstrong  Sent: 01/17/2020  10:24 AM EST  To: Nigel Bridgeman  Subject: Fever                                            Kim,    I just wanted to inform you that i called my primary care giver and they could only see me for a virtual visit at 2pm today. I woke up this morning with a fever of 100.5 and overall not feeling well. Body aches and bad headache last night.    Arianie    Forwarded the above to Gertie Fey, NP letting her know that patient had runny nose yesterday and did not feel well but no fever; yesterday had told patient if not well today to follow up with PCP and be checked for COVID.     Sent message to patient:  Hi.  I am sorry that you are not feeling well again today and that you now have fever.   I am glad that you have a PCP visit scheduled.   If you have not already been checked between yesterday and today, it would be good for you to be screened for COVID.  Hopefully you will not have COVID (as there are other virsuses going around) but if you do, then you need to let us know since you would need to be referred for the monoclonal infusion.    I have also forwarded your message to Amil Amen to be aware too.     As a side note, your labs are nice and stable. The magnesium is low at 1.3. I have included the food list with foods high in magnesium to incorporate in your diet to hopefully bring the magnesium up.     Thanks,  [name]

## 2020-01-18 ENCOUNTER — Ambulatory Visit: Payer: 59 | Admitting: Physical Therapy

## 2020-01-18 DIAGNOSIS — Z944 Liver transplant status: Principal | ICD-10-CM

## 2020-01-18 DIAGNOSIS — R509 Fever, unspecified: Secondary | ICD-10-CM | POA: Diagnosis not present

## 2020-01-18 LAB — TACROLIMUS LEVEL: TACROLIMUS BLOOD: 2.7 ng/mL (ref 2.0–20.0)

## 2020-01-18 MED ORDER — TACROLIMUS XR 1 MG TABLET,EXTENDED RELEASE 24 HR
ORAL_TABLET | 11 refills | 0 days | Status: CP
Start: 2020-01-18 — End: 2020-02-10

## 2020-01-18 MED ORDER — TACROLIMUS XR 4 MG TABLET,EXTENDED RELEASE 24 HR
ORAL_TABLET | ORAL | 11 refills | 0.00000 days | Status: CP
Start: 2020-01-18 — End: 2020-02-10

## 2020-01-18 MED FILL — MIRTAZAPINE 15 MG TABS: 15 | 30 days supply | Qty: 30 | Fill #0

## 2020-01-18 NOTE — Unmapped (Signed)
Noted patient's 11/22 tac level was low @ 2.3 despite recent dosing increase to 8mg  daily.  Confirmed patient has had no missed doses and is taking 8mg  daily in AM without food (confirmed taking two 4mg  tablets to make dosing) - reviewed with NP Martin-Velez who orders patient to increase dosing to 10mg  once daily patient verbalized understanding.    Patient confirms she had virtual visit with PCP yesterday for symptoms discussed with primary coordinator - she has COVID test pending collected this AM @ 1000 and anticipates results today.  Encouraged her to notify on call coordinator if COVID + for consideration for monoclonal antibody infusion.

## 2020-01-19 DIAGNOSIS — Z944 Liver transplant status: Principal | ICD-10-CM

## 2020-01-19 DIAGNOSIS — Z79899 Other long term (current) drug therapy: Principal | ICD-10-CM

## 2020-01-21 MED FILL — MYCOPHENOLATE SODIUM 180 MG: 180 | 30 days supply | Qty: 60 | Fill #1

## 2020-01-21 MED FILL — ENVARSUS XR 4 MG TB24: 4 | 30 days supply | Qty: 60 | Fill #1

## 2020-01-21 MED FILL — ENVARSUS XR 1 MG TB24: 1 | 30 days supply | Qty: 60 | Fill #1

## 2020-01-23 ENCOUNTER — Other Ambulatory Visit: Payer: Self-pay

## 2020-01-23 ENCOUNTER — Encounter: Payer: Self-pay | Admitting: Physical Therapy

## 2020-01-23 ENCOUNTER — Ambulatory Visit: Payer: 59 | Admitting: Physical Therapy

## 2020-01-23 DIAGNOSIS — Z944 Liver transplant status: Principal | ICD-10-CM

## 2020-01-23 DIAGNOSIS — Z79899 Other long term (current) drug therapy: Principal | ICD-10-CM

## 2020-01-23 DIAGNOSIS — M62830 Muscle spasm of back: Secondary | ICD-10-CM

## 2020-01-23 DIAGNOSIS — M6281 Muscle weakness (generalized): Secondary | ICD-10-CM

## 2020-01-23 DIAGNOSIS — R293 Abnormal posture: Secondary | ICD-10-CM

## 2020-01-23 DIAGNOSIS — R2681 Unsteadiness on feet: Secondary | ICD-10-CM

## 2020-01-23 DIAGNOSIS — R2689 Other abnormalities of gait and mobility: Secondary | ICD-10-CM

## 2020-01-23 NOTE — Therapy (Addendum)
Christine Butler, [place], [address] Phone: [phone]   Fax:  [phone]  Physical Therapy Treatment / Re-evaluation  Patient Details  Name: Christine Butler MRN: [id number] Date of Birth: February 11, 1971 Referring Provider (PT): Benna Dunks, NP    Encounter Date: 01/23/2020   PT End of Session - 01/23/20 1145     Visit Number 8    Number of Visits 17    Date for PT Re-Evaluation 03/19/20    Authorization Type [facility] UMR    PT Start Time 3546    PT Stop Time 1225    PT Time Calculation (min) 44 min    Activity Tolerance Patient tolerated treatment well    Behavior During Therapy [facility] for tasks assessed/performed             Past Medical History:  Diagnosis Date   (HFpEF) heart failure with preserved ejection fraction ([place]) 08/09/2019   Acute blood loss anemia 04/20/2017   Altered mental status 08/14/2019   Arthritis    right knee   Ascites--mild this admit 11/06/2017   per patient , now stable with meds and lifestlye adjustment   Atypical chest pain 11/05/2017   Atypical squamous cells of undetermined significance (ASCUS) on Papanicolaou smear of cervix 08/05/2018   CAD (coronary artery disease)    a. s/p CABGx2 in 02/2017 (LAD not suitable for PCI), EF normal.   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   Cirrhosis ([place]) 03/16/2017   Coronary artery disease 03/24/2017   Dyslipidemia, goal LDL below 70 10/13/2017   Elevated LFTs 04/20/2017   Encephalopathy 09/29/2019   Familial hyperlipidemia    GERD (gastroesophageal reflux disease)    GI bleed 02/01/2018   H/O LEEP 03/30/2019   H/O two vessel coronary artery bypass graft 03/30/2019   High grade squamous intraepithelial lesion (HGSIL) on cytologic smear of cervix 05/04/2019   Hypo-osmolality and hyponatremia 03/30/2019   IBS (irritable bowel syndrome)    Other insomnia 09/05/2019    Pancytopenia ([place]) 09/28/2019   PONV (postoperative nausea and vomiting)    i always throw up on waking up , but last EGD in march had no issues     Port-A-Cath in place 11/21/2019   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 IMOUPDATE   Primary biliary cirrhosis ([place])    cirhosis/liver disease followed by transplant team led by Dr Manuella Ghazi at [facility]    S/P CABG (coronary artery bypass graft)    S/P CABG x 2 03/24/2017   LIMA to DIAGONAL Portion of SVG/LEFT RADIAL to LAD   S/P LEEP (loop electrosurgical excision procedure) 05/11/2019   Status post liver transplantation ([place]) 03/30/2019   SVD (spontaneous vaginal delivery)    x 3   Upper GI bleed 02/01/2018   Urinary tract bacterial infections     Past Surgical History:  Procedure Laterality Date   CORONARY ARTERY BYPASS GRAFT N/A 03/24/2017   Procedure: CORONARY ARTERY BYPASS GRAFTING (CABG) x two, using left internal mammary artery, left radial artery, and right leg greater saphenous vein harvested endoscopically;  Surgeon: Ivin Poot, MD;  Location: [facility];  Service: Open Heart Surgery;  Laterality: N/A;   ENDOVEIN HARVEST OF GREATER SAPHENOUS VEIN Right 03/24/2017   Procedure: ENDOVEIN HARVEST OF GREATER SAPHENOUS VEIN;  Surgeon: Ivin Poot, MD;  Location: [facility];  Service: Open Heart  Surgery;  Laterality: Right;   ESOPHAGEAL BANDING  02/01/2018   Procedure: ESOPHAGEAL BANDING;  Surgeon: [name], MD;  Location: [facility];  Service: Gastroenterology;;   ESOPHAGEAL BANDING N/A 03/29/2018   Procedure: ESOPHAGEAL BANDING;  Surgeon: [name], MD;  Location: [facility];  Service: Gastroenterology;  Laterality: N/A;   ESOPHAGEAL BANDING N/A 05/21/2018   Procedure: ESOPHAGEAL BANDING;  Surgeon: [name], MD;  Location: [facility];  Service: Gastroenterology;  Laterality: N/A;   ESOPHAGEAL BANDING N/A 10/11/2018   Procedure: ESOPHAGEAL BANDING;  Surgeon: [name], MD;  [facility]: [facility];  Service: Gastroenterology;  Laterality: N/A;   ESOPHAGOGASTRODUODENOSCOPY N/A 03/29/2018   Procedure: ESOPHAGOGASTRODUODENOSCOPY (EGD);  Surgeon: [name], MD;  Location: [facility];  Service: Gastroenterology;  Laterality: N/A;   ESOPHAGOGASTRODUODENOSCOPY N/A 05/21/2018   Procedure: ESOPHAGOGASTRODUODENOSCOPY (EGD);  Surgeon: [name], MD;  Location: [facility];  Service: Gastroenterology;  Laterality: N/A;   ESOPHAGOGASTRODUODENOSCOPY (EGD) WITH PROPOFOL N/A 02/01/2018   Procedure: ESOPHAGOGASTRODUODENOSCOPY (EGD) WITH PROPOFOL;  Surgeon: [name], MD;  [facility];  Service: Gastroenterology;  Laterality: N/A;   ESOPHAGOGASTRODUODENOSCOPY (EGD) WITH PROPOFOL N/A 10/11/2018   Procedure: ESOPHAGOGASTRODUODENOSCOPY (EGD) WITH PROPOFOL;  Surgeon: [name], MD;  [facility];  Service: Gastroenterology;  Laterality: N/A;   INCONTINENCE SURGERY     IR IMAGING GUIDED PORT INSERTION  04/08/2019   LEEP N/A 03/28/2014   Procedure: LOOP ELECTROSURGICAL EXCISION PROCEDURE (LEEP) cone biopsy;  Surgeon: Cyril Mourning, MD;  Location: [facility];  Service: Gynecology;  Laterality: N/A;   LEFT HEART CATH AND CORONARY ANGIOGRAPHY N/A 03/23/2017   Procedure: LEFT HEART CATH AND CORONARY ANGIOGRAPHY;  Surgeon: Jettie Booze, MD;  Location: [facility];  Service: Cardiovascular;  Laterality: N/A;   LIVER BIOPSY     x 2   RADIAL ARTERY HARVEST Left 03/24/2017   Procedure: RADIAL ARTERY HARVEST;  Surgeon: Ivin Poot, MD;  Location: [facility];  Service: Open Heart Surgery;  Laterality: Left;   TEE WITHOUT CARDIOVERSION N/A 03/24/2017   Procedure: TRANSESOPHAGEAL ECHOCARDIOGRAM (TEE);  Surgeon: Prescott Gum, Collier Salina, MD;  Location: [facility];  Service: Open Heart Surgery;  Laterality: N/A;   WISDOM TOOTH EXTRACTION      There were no vitals filed for this visit.   Subjective Assessment - 01/23/20 1142     Subjective "I felt sick last week  but I'm feeling better. I went on hikes in [place] to see waterfalls and my legs were feeling it bigtime. My back was sore from riding in the car. my boss pushed back my return to work to the end of februrary    Pertinent History CAD, liver tranplants    Limitations Standing;Walking;Sitting    How long can you sit comfortably? 1 hour    How long can you stand comfortably? 15 min    How long can you walk comfortably? 15 min    Patient Stated Goals get back to walking a mile, increase stamina, promote strength and return work.    Currently in Pain? No/denies   Just low back soreness   Pain Onset More than a month ago                  [facility] PT Assessment - 01/23/20 0001       Strength   Right Shoulder Flexion 4-/5    Right Shoulder Extension 4-/5    Right Shoulder ABduction 4-/5    Right Shoulder Internal Rotation 4-/5    Right Shoulder External  Rotation 4-/5    Left Shoulder Flexion 4-/5    Left Shoulder Extension 4-/5    Left Shoulder ABduction 4-/5    Left Shoulder Internal Rotation 4-/5    Left Shoulder External Rotation 4-/5    Right Hip Flexion 4/5    Right Hip Extension 4-/5    Right Hip ADduction 4+/5    Left Hip Flexion 4+/5    Left Hip Extension 4-/5    Left Hip ADduction 4+/5    Right Knee Flexion 4+/5   Pain around patella inferior/inferolateral   Right Knee Extension 4+/5    Left Knee Flexion 4+/5    Left Knee Extension 4+/5      Standardized Balance Assessment   Five times sit to stand comments  11      Timed Up and Go Test   Normal TUG (seconds) 6.5                                        [facility] Adult PT Treatment/Exercise - 01/23/20 0001       Lumbar Exercises: Supine   Bridge 5 seconds;10 reps    Large Ball Abdominal Isometric 20 reps;5 seconds    Other Supine Lumbar Exercises Lower trunk rotations 1x15 to loosen low back spasm    Other Supine Lumbar Exercises single knee to chest stretch bilateral 2x30s; supine figure-4 stretch  bilateral 2x30s      Knee/Hip Exercises: Standing   Step Down Both;2 sets;Hand Hold: 1;Step Height: 6";20 reps    Step Down Limitations TC/VC for knees over toes and activating the core                          PT Education - 01/23/20 1226     Education Details Reviewed HEP, importance of stretching in the morning especially hamstring and piriformis, stopping and stretching during long car rides    Person(s) Educated Patient    Methods Explanation;Demonstration    Comprehension Verbalized understanding;Returned demonstration              PT Short Term Goals - 01/23/20 1146       PT SHORT TERM GOAL #1   Title pt to be IND with initial HEP    Time 4    Period Weeks    Status Achieved    Target Date 12/22/19      PT SHORT TERM GOAL #2   Title increase bil hip abductor / extensor strength to >/ 3-/5 to promote hip stability    Time 4    Period Weeks    Status Achieved    Target Date 12/22/19      PT SHORT TERM GOAL #3   Title increase 5 x sit to stand  and TUG to </= 13 seconds to work toward functional / safe transitions    Time 4    Period Weeks    Status Achieved    Target Date 12/22/19                PT Long Term Goals - 01/23/20 1148       PT LONG TERM GOAL #1   Title increase bil LE gross strength to 4+/5 to promtoe hip/ knee stability and promote efficient posture    Time 8    Period Weeks    Status Partially Met      PT  LONG TERM GOAL #2   Title increase bil Gross UE strength to >/= 4+/5 to assist with functional lifting/ carrying / pushing and pull as it relates to potential work related tasks.    Time 8    Period Weeks    Status Partially Met      PT LONG TERM GOAL #3   Title improve 5 x STS to </= 12 seconds and TUG to </= 10 seconds to for functiona/ efficent and safe mobility    Time 8    Period Weeks    Status Achieved      PT LONG TERM GOAL #4   Title pt to be able to stand and walk for >/= 1 hour for functional endurnace  required for work tasks and community ambulation with no report of pain    Time 8    Period Weeks    Status Achieved      PT LONG TERM GOAL #5   Title increase FOTO score to </=44% limited to demo improvement in function    Baseline 57% limited at initial eval    Time 8    Period Weeks    Status Partially Met      PT LONG TERM GOAL #6   Title pt to be IND with all HEP to maintain and progress current LOF IND    Time 8    Period Weeks    Status Partially Met                        Plan - 01/23/20 1209     Clinical Impression Statement Pt was unable to attend therapy for 2 weeks for personal reasons but has maintained progressed. Pt's strength, TUG, and and 5xSTS have all improved. focus of treatment today was stair descent with emphasis on avoidance of right knee valgus on descent; additional focus was on core strengthening. Pt will continue to benefit from therapy to improve strength, endurance, and independence with ADLs.    Personal Factors and Comorbidities Comorbidity 1    Comorbidities hx of liver transplant, anemia, significant PMHx/ surgical hx    Stability/Clinical Decision Making Evolving/Moderate complexity    Clinical Decision Making Moderate    Rehab Potential Good    PT Frequency 2x / week    PT Duration 8 weeks    PT Treatment/Interventions ADLs/Self Care Home Management;Moist Heat;Cryotherapy;Gait training;Stair training;Functional mobility training;Therapeutic activities;Therapeutic exercise;Balance training;Neuromuscular re-education;Manual techniques;Passive range of motion;Taping;Patient/family education    PT Next Visit Plan work on endurance / strengthening of lower quarter and upper quarter with special emphasis on CORE    PT Home Exercise Plan [phone] - low back stretch (seated walking hands on chair), sit to stand with hands on knees, lower trunk rotations, standing hip flexor stretch, STS, Heel taps off step, wall squat, anti-rotation step outs,     Consulted and Agree with Plan of Care Patient             Patient will benefit from skilled therapeutic intervention in order to improve the following deficits and impairments:  Abnormal gait, Improper body mechanics, Postural dysfunction, Increased muscle spasms, Decreased strength, Pain, Decreased activity tolerance, Decreased balance, Decreased endurance  Visit Diagnosis: Muscle weakness (generalized)  Other abnormalities of gait and mobility  Muscle spasm of back  Abnormal posture  Unsteadiness on feet      Problem List Patient Active Problem List   Diagnosis Date Noted   S/P CABG (coronary artery bypass graft)  Port-A-Cath in place 11/21/2019   Encephalopathy 09/29/2019   Pancytopenia ([place]) 09/28/2019   Arthritis 09/21/2019   PONV (postoperative nausea and vomiting) 09/21/2019   SVD (spontaneous vaginal delivery) 09/21/2019   Urinary tract bacterial infections 09/21/2019   CAD (coronary artery disease)    Other insomnia 09/05/2019   Catatonia 08/18/2019   Altered mental status 08/14/2019   (HFpEF) heart failure with preserved ejection fraction ([place]) 08/09/2019   S/P LEEP (loop electrosurgical excision procedure) 05/11/2019   High grade squamous intraepithelial lesion (HGSIL) on cytologic smear of cervix 05/04/2019   Familial hyperlipidemia 03/30/2019   H/O two vessel coronary artery bypass graft 03/30/2019   Status post liver transplantation ([place]) 03/30/2019   Hypo-osmolality and hyponatremia 03/30/2019   Atypical squamous cells of undetermined significance (ASCUS) on Papanicolaou smear of cervix 08/05/2018   Upper GI bleed 02/01/2018   GI bleed 02/01/2018   Ascites--mild this admit 11/06/2017   Atypical chest pain 11/05/2017   Dyslipidemia, goal LDL below 70 10/13/2017   Acute blood loss anemia 04/20/2017   Elevated LFTs 04/20/2017   Coronary artery disease 03/24/2017   S/P CABG x 2 03/24/2017   CAD (coronary artery disease), native coronary artery  03/23/2017   Cirrhosis ([place]) 03/16/2017   GERD (gastroesophageal reflux disease) 01/28/2012   Primary biliary cirrhosis ([place]) 01/28/2012   IBS (irritable bowel syndrome) 01/28/2012   Primary biliary cholangitis ([place]) 01/28/2012    Dawayne Cirri, SPT 01/23/2020, 12:36 PM  [facility] [address] [place], [place], [address] Phone: [phone]   Fax:  [phone]  Name: ANJELINA DUNG MRN: [id number] Date of Birth: 1971/02/22

## 2020-01-23 NOTE — Patient Instructions (Signed)
Access Code: [email or web address] URL: [email or web address] Date: 01/23/2020 Prepared by: [place].  Exercises Lower Trunk Rotation - 1 x daily - 4 x weekly - 2 sets - 10 reps Seated Flexion Stretch with Swiss Ball - 2 x daily - 4 x weekly - 2 sets - 2 reps - 30 seconds hold Seated Hamstring Stretch - 1 x daily - 4 x weekly - 3 sets - 10 reps Standing Hip Flexor Stretch - 1 x daily - 4 x weekly - 3 sets - 5 reps Wall Squat - 1 x daily - 4 x weekly - 1 sets - 10 reps - 10 hold Anti-Rotation Sidestepping with Resistance - 1 x daily - 4 x weekly - 3 sets - 10 reps Standing Shoulder Row with Anchored Resistance - 1 x daily - 4 x weekly - 3 sets - 10 reps Lateral Step Down - 1 x daily - 7 x weekly - 2 sets - 6 reps

## 2020-01-24 ENCOUNTER — Telehealth
Admit: 2020-01-24 | Discharge: 2020-01-25 | Payer: PRIVATE HEALTH INSURANCE | Attending: Psychiatry | Primary: Psychiatry

## 2020-01-24 DIAGNOSIS — Z79899 Other long term (current) drug therapy: Principal | ICD-10-CM

## 2020-01-24 DIAGNOSIS — G4709 Other insomnia: Principal | ICD-10-CM

## 2020-01-24 DIAGNOSIS — Z944 Liver transplant status: Principal | ICD-10-CM

## 2020-01-24 DIAGNOSIS — F061 Catatonic disorder due to known physiological condition: Principal | ICD-10-CM

## 2020-01-24 LAB — CBC W/ DIFFERENTIAL
BANDED NEUTROPHILS ABSOLUTE COUNT: 0 10*3/uL (ref 0.0–0.1)
BASOPHILS ABSOLUTE COUNT: 0 10*3/uL (ref 0.0–0.2)
BASOPHILS RELATIVE PERCENT: 1 %
EOSINOPHILS ABSOLUTE COUNT: 0.1 10*3/uL (ref 0.0–0.4)
EOSINOPHILS RELATIVE PERCENT: 2 %
HEMATOCRIT: 27.3 % — ABNORMAL LOW (ref 34.0–46.6)
HEMOGLOBIN: 9.3 g/dL — ABNORMAL LOW (ref 11.1–15.9)
IMMATURE GRANULOCYTES: 1 %
LYMPHOCYTES ABSOLUTE COUNT: 0.9 10*3/uL (ref 0.7–3.1)
LYMPHOCYTES RELATIVE PERCENT: 31 %
MEAN CORPUSCULAR HEMOGLOBIN CONC: 34.1 g/dL (ref 31.5–35.7)
MEAN CORPUSCULAR HEMOGLOBIN: 33.2 pg — ABNORMAL HIGH (ref 26.6–33.0)
MEAN CORPUSCULAR VOLUME: 98 fL — ABNORMAL HIGH (ref 79–97)
MONOCYTES ABSOLUTE COUNT: 0.3 10*3/uL (ref 0.1–0.9)
MONOCYTES RELATIVE PERCENT: 9 %
NEUTROPHILS ABSOLUTE COUNT: 1.6 10*3/uL (ref 1.4–7.0)
NEUTROPHILS RELATIVE PERCENT: 56 %
PLATELET COUNT: 175 10*3/uL (ref 150–450)
RED BLOOD CELL COUNT: 2.8 x10E6/uL — ABNORMAL LOW (ref 3.77–5.28)
RED CELL DISTRIBUTION WIDTH: 14.6 % (ref 11.7–15.4)
WHITE BLOOD CELL COUNT: 2.9 10*3/uL — ABNORMAL LOW (ref 3.4–10.8)

## 2020-01-24 LAB — MAGNESIUM: MAGNESIUM: 1.5 mg/dL — ABNORMAL LOW (ref 1.6–2.3)

## 2020-01-24 LAB — COMPREHENSIVE METABOLIC PANEL
A/G RATIO: 1.9 (ref 1.2–2.2)
ALBUMIN: 3.9 g/dL (ref 3.8–4.8)
ALKALINE PHOSPHATASE: 87 IU/L (ref 44–121)
ALT (SGPT): 6 IU/L (ref 0–32)
AST (SGOT): 7 IU/L (ref 0–40)
BILIRUBIN TOTAL: 0.4 mg/dL (ref 0.0–1.2)
BLOOD UREA NITROGEN: 11 mg/dL (ref 6–24)
BUN / CREAT RATIO: 13 (ref 9–23)
CALCIUM: 9.2 mg/dL (ref 8.7–10.2)
CHLORIDE: 109 mmol/L — ABNORMAL HIGH (ref 96–106)
CO2: 20 mmol/L (ref 20–29)
CREATININE: 0.83 mg/dL (ref 0.57–1.00)
GLOBULIN, TOTAL: 2.1 g/dL (ref 1.5–4.5)
GLUCOSE: 94 mg/dL (ref 65–99)
POTASSIUM: 3.8 mmol/L (ref 3.5–5.2)
SODIUM: 144 mmol/L (ref 134–144)
TOTAL PROTEIN: 6 g/dL (ref 6.0–8.5)

## 2020-01-24 LAB — BILIRUBIN, DIRECT: BILIRUBIN DIRECT: 0.15 mg/dL (ref 0.00–0.40)

## 2020-01-24 LAB — PHOSPHORUS: PHOSPHORUS, SERUM: 3.5 mg/dL (ref 3.0–4.3)

## 2020-01-24 LAB — GAMMA GT: GAMMA GLUTAMYL TRANSFERASE: 21 IU/L (ref 0–60)

## 2020-01-24 NOTE — Unmapped (Addendum)
[facility]  Psychiatry   Established Patient E&M Service - Outpatient       Assessment:    Kathy Armstrong presents for follow-up evaluation. Pt w/ interval improvements in sleep and appetite since switching from Zyprexa to Remeron. Will continue to monitor for any side effects (e.g. too much appetite/weight gain) No evidence of catatonia or delirium. No acute safety concerns. No med changes today. Resident transition discussed. Follow up in 6 weeks with transition resident.    Identifying Information:  Kathy Armstrong is a 49 y.o. 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 She tapered off of Ativan after discharge with continued resolution of catatonia on both occasions. In late Aug 2021, pt had a liver transplant. Her hospital course was complicated by re-emergence of catatonia and subsequently delirium post-transplant, and she was started on Ativan and subsequently Zyprexa, with good effect, which was her discharge psych regimen as well. She was transitioned from Zyprexa to Remeron in November 2021 for better side-effect profile, and she tolerated it well.    Risk Assessment:  An assessment of suicide and violence risk factors was performed as part of this evaluation and is not significantly changed from the last visit.   While future psychiatric events cannot be accurately predicted, the patient does not currently require acute inpatient psychiatric care and does not currently meet [facility] involuntary commitment criteria.      Plan:    Problem 1: Catatonia  Status of problem: resolved  Interventions:   - Ativan taper completed on 9/24    Problem 2: Insomnia  Status of problem: improved or improving  Interventions:   - Pt has stopped melatonin  - Continue Remeron 15mg  at bedtime    Psychotherapy provided:  No billable psychotherapy service provided.    Patient has been given this writer's contact information as well as the [facility] Psychiatry urgent line number. The patient has been instructed to call 911 for emergencies.    Patient was seen and plan of care was discussed with the Attending MD, Dr. Elita Boone, who agrees with the above statement and plan.      Subjective:    Interval History:   Doing great. Increased Remeron to 15mg . Reports good sleep w/ less tossing and turning, gets 9 hrs/night. Appetite is better and eating more. Not losing weight. Not feeling as sleepy during the day w/ change to Remeron. Denies any confusion. Doing good enough that she is by herself now, and her sister has left to [place]. Has ex-husband nearby who can come quickly if things are not well. Mood has been good. Denies any anxiety. Getting out and meeting friends more. Going back to work in February - has been working at [employer] as a Engineer, structural for a long time. Reports that all the issues w/ catatonia started after getting endoscopies for varices. Discussed consent for case report, and she was amenable to Dr. Cheri Rous sending her a consent form via MyChart to sign it.    Objective:    Mental Status Exam:  Appearance:    Appears stated age, Well nourished, Well developed and Clean/Neat   Motor:   No  abnormal movements   Speech/Language:    Normal rate, volume, tone, fluency and Language intact, well formed   Mood:   Good   Affect:   Calm, Cooperative, Euthymic, Full and Mood congruent   Thought process and Associations:   Logical, linear, clear, coherent, goal directed   Abnormal/psychotic thought content:     Denies SI, HI, self harm, delusions, obsessions, paranoid ideation, or ideas of reference   Perceptual disturbances:     Denies auditory and visual hallucinations, behavior not concerning for response to internal stimuli     Other:   None     Visit was completed by video (or phone) and the appropriate disclaimer has been included below.        I spent 10 minutes on the real-time audio and video with the patient on the date of service. I spent an additional 10 minutes on pre- and post-visit activities on the date of service.     The patient was physically located in [place] or a state in which I am permitted to provide care. The patient and/or parent/guardian understood that s/he may incur co-pays and cost sharing, and agreed to the telemedicine visit. The visit was reasonable and appropriate under the circumstances given the patient's presentation at the time.    The patient and/or parent/guardian has been advised of the potential risks and limitations of this mode of treatment (including, but not limited to, the absence of in-person examination) and has agreed to be treated using telemedicine. The patient's/patient's family's questions regarding telemedicine have been answered.     If the visit was completed in an ambulatory setting, the patient and/or parent/guardian has also been advised to contact their provider???s office for worsening conditions, and seek emergency medical treatment and/or call 911 if the patient deems either necessary.    Park Pope, MD

## 2020-01-24 NOTE — Unmapped (Signed)
Follow-up instructions:  -- Please continue taking your medications as prescribed for your mental health.   -- Do not make changes to your medications, including taking more or less than prescribed, unless under the supervision of your physician. Be aware that some medications may make you feel worse if abruptly stopped  -- Please refrain from using illicit substances, as these can affect your mood and could cause anxiety or other concerning symptoms.   -- Seek further medical care for any increase in symptoms or new symptoms such as thoughts of wanting to hurt yourself or hurt others.     Contact info:  Life-threatening emergencies: call 911 or go to the nearest ER for medical or psychiatric attention.     Issues that need urgent attention but are not life threatening: call the clinic outpatient frontdesk at [phone] for assistance.     Non-urgent routine concerns, questions, and refill requests: please send me a MyChart message and I will get back to you within 2 business days.    Regarding appointments:  - If you need to cancel your appointment, we ask that you call [phone] at least 24 hours before your scheduled appointment.  - If for any reason you arrive 15 minutes later than your scheduled appointment time, you may not be seen and your visit may be rescheduled.  - Please remember that we will not automatically reschedule missed appointments.  - If you miss two (2) appointments without letting us know in advance, you will likely be referred to a provider in your community.  - We will do our best to be on time. Sometimes an emergency will arise that might cause your clinician to be late. We will try to inform you of this when you check in for your appointment. If you wait more than 15 minutes past your appointment time without such notice, please speak with the front desk staff.    In the event of bad weather, the clinic staff will attempt to contact you, should your appointment need to be rescheduled. Additionally, you can call the Patient Weather Line [phone] for system-wide clinic status    For more information and reminders regarding clinic policies (these were provided when you were admitted to the clinic), please ask the front desk.

## 2020-01-24 NOTE — Unmapped (Signed)
Received message from patient while this coordinator out of office:  --- Message -----       From:Kathy Armstrong       Sent:01/19/2020  6:44 PM EST         ZO:[id number] Kathy Armstrong    Subject:Tacrolimus levels    [place] Faster!   I was sitting around talking with the fam tonight about my meds amongst other interesting topics lol . I???m so stumped about why my tac levels remain so low. I have been going through everything I eat and drink and just thought about my citrus cooler Gatorade I love as much as I love my own children . Does that by any chance have grapefruit in it like [place] / [place] D??? Just a thought ???.I hope you and your family had a wonderful Thanksgiving! I???m so thankful for you and the whole team. You guys are a big part of the reason I???m alive today to be here with my family !     Kathy Armstrong     Sent MyChart message to patient:  Hi Kathy Armstrong.     Thank you for your kind words and Thanksgiving was good. I hope that you are feeling better and that you enjoyed your family time during Thanksgiving.    I just looked up the ingredients to the Citrus Cooler of Gatorade and it does not show grapefruit in the ingredients that hopefully is the full list and not sure what natural flavor is. However, if you have been drinking these everyday (and if it contained grapefruit), you level would be elevated and not going down (unless you stopped drinking them during the time of the past few labs draws).    I saw the covering coordinator's note that no doses were missed, that you are taking these without food and were taking the directed dose. The good thing is there is only brand Envarsus and it would not be related to a change in manufacturer.   Are you waiting 45 minutes to an hour after you take it to eat something?     Thankfully, despite the low tacrolimus levels, your liver labs have remained nice and stable.     We will see what yesterday's level returns as since it is still pending.     Thanks,  [name]

## 2020-01-25 ENCOUNTER — Encounter: Payer: Self-pay | Admitting: Physical Therapy

## 2020-01-25 ENCOUNTER — Other Ambulatory Visit: Payer: Self-pay

## 2020-01-25 ENCOUNTER — Ambulatory Visit: Payer: 59 | Attending: Family Medicine | Admitting: Physical Therapy

## 2020-01-25 DIAGNOSIS — M62830 Muscle spasm of back: Secondary | ICD-10-CM | POA: Insufficient documentation

## 2020-01-25 DIAGNOSIS — R2681 Unsteadiness on feet: Secondary | ICD-10-CM | POA: Diagnosis not present

## 2020-01-25 DIAGNOSIS — R2689 Other abnormalities of gait and mobility: Secondary | ICD-10-CM | POA: Insufficient documentation

## 2020-01-25 DIAGNOSIS — M6281 Muscle weakness (generalized): Secondary | ICD-10-CM | POA: Insufficient documentation

## 2020-01-25 DIAGNOSIS — R293 Abnormal posture: Secondary | ICD-10-CM | POA: Diagnosis not present

## 2020-01-25 LAB — TACROLIMUS LEVEL: TACROLIMUS BLOOD: 6 ng/mL (ref 2.0–20.0)

## 2020-01-25 NOTE — Therapy (Signed)
[place], [place], [address] Phone: [phone]   Fax:  [phone]  Physical Therapy Treatment  Patient Details  Name: Christine Butler MRN: [id number] Date of Birth: 11-Oct-1970 Referring Provider (PT): Benna Dunks, NP    Encounter Date: 01/25/2020   PT End of Session - 01/25/20 1109    Visit Number 9    Number of Visits 17    Date for PT Re-Evaluation 03/19/20    Authorization Type [facility] UMR    PT Start Time 1103    PT Stop Time 1145    PT Time Calculation (min) 42 min    Activity Tolerance Patient tolerated treatment well    Behavior During Therapy [facility] for tasks assessed/performed           Past Medical History:  Diagnosis Date  . (HFpEF) heart failure with preserved ejection fraction ([place]) 08/09/2019  . Acute blood loss anemia 04/20/2017  . Altered mental status 08/14/2019  . Arthritis    right knee  . Ascites--mild this admit 11/06/2017   per patient , now stable with meds and lifestlye adjustment  . Atypical chest pain 11/05/2017  . Atypical squamous cells of undetermined significance (ASCUS) on Papanicolaou smear of cervix 08/05/2018  . CAD (coronary artery disease)    a. s/p CABGx2 in 02/2017 (LAD not suitable for PCI), EF normal.  . CAD (coronary artery disease), native coronary artery 03/23/2017  . 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 Cirrhosis ([place]) 03/16/2017  . Coronary artery disease 03/24/2017  . Dyslipidemia, goal LDL below 70 10/13/2017  . Elevated LFTs 04/20/2017  . Encephalopathy 09/29/2019  . Familial hyperlipidemia   . GERD (gastroesophageal reflux disease)   . GI bleed 02/01/2018  . H/O LEEP 03/30/2019  . H/O two vessel coronary artery bypass graft 03/30/2019  . High grade squamous intraepithelial lesion (HGSIL) on cytologic smear of cervix 05/04/2019  . Hypo-osmolality and hyponatremia 03/30/2019  . IBS (irritable bowel syndrome)   . Other insomnia 09/05/2019   . Pancytopenia ([place]) 09/28/2019  . PONV (postoperative nausea and vomiting)    i always throw up on waking up , but last EGD in march had no issues    . Port-A-Cath in place 11/21/2019  . Primary biliary cholangitis ([place]) 07/26/2012   Formatting of this note might be different from the original. IMOUPDATE  . Primary biliary cirrhosis (HCC)    cirhosis/liver disease followed by transplant team led by Dr Manuella Ghazi at [facility]   . S/P CABG (coronary artery bypass graft)   . S/P CABG x 2 03/24/2017   LIMA to DIAGONAL Portion of SVG/LEFT RADIAL to LAD  . S/P LEEP (loop electrosurgical excision procedure) 05/11/2019  . Status post liver transplantation ([place]) 03/30/2019  . SVD (spontaneous vaginal delivery)    x 3  . Upper GI bleed 02/01/2018  . Urinary tract bacterial infections     Past Surgical History:  Procedure Laterality Date  . CORONARY ARTERY BYPASS GRAFT N/A 03/24/2017   Procedure: CORONARY ARTERY BYPASS GRAFTING (CABG) x two, using left internal mammary artery, left radial artery, and right leg greater saphenous vein harvested endoscopically;  Surgeon: Ivin Poot, MD;  Location: [facility];  Service: Open Heart Surgery;  Laterality: N/A;  . ENDOVEIN HARVEST OF GREATER SAPHENOUS VEIN Right 03/24/2017   Procedure: ENDOVEIN HARVEST OF GREATER SAPHENOUS VEIN;  Surgeon: Ivin Poot, MD;  Location: [facility];  Service: Open Heart Surgery;  Laterality: Right;  .  ESOPHAGEAL BANDING  02/01/2018   Procedure: ESOPHAGEAL BANDING;  Surgeon: [name], MD;  Location: [facility];  Service: Gastroenterology;;  . ESOPHAGEAL BANDING N/A 03/29/2018   Procedure: ESOPHAGEAL BANDING;  Surgeon: [name], MD;  Location: [facility];  Service: Gastroenterology;  Laterality: N/A;  . ESOPHAGEAL BANDING N/A 05/21/2018   Procedure: ESOPHAGEAL BANDING;  Surgeon: [name], MD;  Location: [facility];  Service: Gastroenterology;  Laterality: N/A;  . ESOPHAGEAL BANDING N/A 10/11/2018   Procedure: ESOPHAGEAL BANDING;   Surgeon: [name], MD;  Location: [facility];  Service: Gastroenterology;  Laterality: N/A;  . ESOPHAGOGASTRODUODENOSCOPY N/A 03/29/2018   Procedure: ESOPHAGOGASTRODUODENOSCOPY (EGD);  Surgeon: [name], MD;  Location: [facility];  Service: Gastroenterology;  Laterality: N/A;  . ESOPHAGOGASTRODUODENOSCOPY N/A 05/21/2018   Procedure: ESOPHAGOGASTRODUODENOSCOPY (EGD);  Surgeon: [name], MD;  Location: [facility];  Service: Gastroenterology;  Laterality: N/A;  . ESOPHAGOGASTRODUODENOSCOPY (EGD) WITH PROPOFOL N/A 02/01/2018   Procedure: ESOPHAGOGASTRODUODENOSCOPY (EGD) WITH PROPOFOL;  Surgeon: [name], MD;  Location: [facility];  Service: Gastroenterology;  Laterality: N/A;  . ESOPHAGOGASTRODUODENOSCOPY (EGD) WITH PROPOFOL N/A 10/11/2018   Procedure: ESOPHAGOGASTRODUODENOSCOPY (EGD) WITH PROPOFOL;  Surgeon: [name], MD;  Location: [facility];  Service: Gastroenterology;  Laterality: N/A;  . INCONTINENCE SURGERY    . IR IMAGING GUIDED PORT INSERTION  04/08/2019  . LEEP N/A 03/28/2014   Procedure: LOOP ELECTROSURGICAL EXCISION PROCEDURE (LEEP) cone biopsy;  Surgeon: Cyril Mourning, MD;  Location: [facility];  Service: Gynecology;  Laterality: N/A;  . LEFT HEART CATH AND CORONARY ANGIOGRAPHY N/A 03/23/2017   Procedure: LEFT HEART CATH AND CORONARY ANGIOGRAPHY;  Surgeon: Jettie Booze, MD;  Location: [facility];  Service: Cardiovascular;  Laterality: N/A;  . LIVER BIOPSY     x 2  . RADIAL ARTERY HARVEST Left 03/24/2017   Procedure: RADIAL ARTERY HARVEST;  Surgeon: Ivin Poot, MD;  Location: [facility];  Service: Open Heart Surgery;  Laterality: Left;  . TEE WITHOUT CARDIOVERSION N/A 03/24/2017   Procedure: TRANSESOPHAGEAL ECHOCARDIOGRAM (TEE);  Surgeon: Prescott Gum, Collier Salina, MD;  Location: [facility];  Service: Open Heart Surgery;  Laterality: N/A;  . WISDOM TOOTH EXTRACTION      There were no vitals filed for this visit.   Subjective Assessment - 01/25/20 1107     Subjective "I'm feeling pretty good, I have a lot going on today.    Pertinent History CAD, liver tranplants    Limitations Standing;Walking;Sitting    How long can you sit comfortably? 1 hour    How long can you stand comfortably? 15 min    How long can you walk comfortably? 15 min    Patient Stated Goals get back to walking a mile, increase stamina, promote strength and return work.    Currently in Pain? No/denies    Pain Onset More than a month ago                             [facility] Adult PT Treatment/Exercise - 01/25/20 0001      Knee/Hip Exercises: Aerobic   Nustep L5 x 4 min UE & LE both (UE at 8)      Knee/Hip Exercises: Standing   Forward Step Up Both;2 sets;10 reps;Hand Hold: 1;Step Height: 6"    Other Standing Knee Exercises Deadlift onto 8" cybex step with 10# kettlebell 3x10   VC for hips then knees     Knee/Hip Exercises: Seated   Marching Strengthening;Both;2 sets;15  reps    Marching Limitations on green swiss ball    Marching Weights --   hand stabilizing ball --> hands on hips     Shoulder Exercises: Therapy Ball   Other Therapy Ball Exercises Palloff Press 2x10 bilaterally with green theraband; chops with 3# 2x10    Other Therapy Ball Exercises Bicep curl to shoulder press 3x10 with 3#                  PT Education - 01/25/20 1109    Education Details Pacing of house cleaning activities to avoid fatigue    Person(s) Educated Patient    Methods Explanation    Comprehension Verbalized understanding            PT Short Term Goals - 01/23/20 1146      PT SHORT TERM GOAL #1   Title pt to be IND with initial HEP    Time 4    Period Weeks    Status Achieved    Target Date 12/22/19      PT SHORT TERM GOAL #2   Title increase bil hip abductor / extensor strength to >/ 3-/5 to promote hip stability    Time 4    Period Weeks    Status Achieved    Target Date 12/22/19      PT SHORT TERM GOAL #3   Title increase 5 x sit to stand   and TUG to </= 13 seconds to work toward functional / safe transitions    Time 4    Period Weeks    Status Achieved    Target Date 12/22/19             PT Long Term Goals - 01/23/20 1148      PT LONG TERM GOAL #1   Title increase bil LE gross strength to 4+/5 to promtoe hip/ knee stability and promote efficient posture    Time 8    Period Weeks    Status Partially Met      PT LONG TERM GOAL #2   Title increase bil Gross UE strength to >/= 4+/5 to assist with functional lifting/ carrying / pushing and pull as it relates to potential work related tasks.    Time 8    Period Weeks    Status Partially Met      PT LONG TERM GOAL #3   Title improve 5 x STS to </= 12 seconds and TUG to </= 10 seconds to for functiona/ efficent and safe mobility    Time 8    Period Weeks    Status Achieved      PT LONG TERM GOAL #4   Title pt to be able to stand and walk for >/= 1 hour for functional endurnace required for work tasks and community ambulation with no report of pain    Time 8    Period Weeks    Status Achieved      PT LONG TERM GOAL #5   Title increase FOTO score to </=44% limited to demo improvement in function    Baseline 57% limited at initial eval    Time 8    Period Weeks    Status Partially Met      PT LONG TERM GOAL #6   Title pt to be IND with all HEP to maintain and progress current LOF IND    Time 8    Period Weeks    Status Partially Met  Plan - 01/25/20 1110    Clinical Impression Statement Pt continues to be limited in endurance but is making progress with core and LE strengthening program. Today's session focused on core work while on green swiss ball which was well tolerated with minimal cuing. Pt continues to have knee pain with stepping especially onto larger steps.    Personal Factors and Comorbidities Comorbidity 1    Comorbidities hx of liver transplant, anemia, significant PMHx/ surgical hx    Stability/Clinical Decision Making  Evolving/Moderate complexity    Rehab Potential Good    PT Frequency 2x / week    PT Duration 8 weeks    PT Treatment/Interventions ADLs/Self Care Home Management;Moist Heat;Cryotherapy;Gait training;Stair training;Functional mobility training;Therapeutic activities;Therapeutic exercise;Balance training;Neuromuscular re-education;Manual techniques;Passive range of motion;Taping;Patient/family education    PT Next Visit Plan Hip strengthening (esp ABD and EXT), Core strengthening, UE work,    PT Home Exercise Plan [phone] - low back stretch (seated walking hands on chair), sit to stand with hands on knees, lower trunk rotations, standing hip flexor stretch, STS, Heel taps off step, wall squat, anti-rotation step outs,    Consulted and Agree with Plan of Care Patient           Patient will benefit from skilled therapeutic intervention in order to improve the following deficits and impairments:  Abnormal gait, Improper body mechanics, Postural dysfunction, Increased muscle spasms, Decreased strength, Pain, Decreased activity tolerance, Decreased balance, Decreased endurance  Visit Diagnosis: Muscle weakness (generalized)  Other abnormalities of gait and mobility  Muscle spasm of back  Abnormal posture  Unsteadiness on feet     Problem List Patient Active Problem List   Diagnosis Date Noted  . S/P CABG (coronary artery bypass graft)   . Port-A-Cath in place 11/21/2019  . Encephalopathy 09/29/2019  . Pancytopenia ([place]) 09/28/2019  . Arthritis 09/21/2019  . PONV (postoperative nausea and vomiting) 09/21/2019  . SVD (spontaneous vaginal delivery) 09/21/2019  . Urinary tract bacterial infections 09/21/2019  . CAD (coronary artery disease)   . Other insomnia 09/05/2019  . 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 Altered mental status 08/14/2019  . (HFpEF) heart failure with preserved ejection fraction ([place]) 08/09/2019  . S/P LEEP (loop electrosurgical excision procedure) 05/11/2019  . High grade  squamous intraepithelial lesion (HGSIL) on cytologic smear of cervix 05/04/2019  . Familial hyperlipidemia 03/30/2019  . H/O two vessel coronary artery bypass graft 03/30/2019  . Status post liver transplantation ([place]) 03/30/2019  . Hypo-osmolality and hyponatremia 03/30/2019  . Atypical squamous cells of undetermined significance (ASCUS) on Papanicolaou smear of cervix 08/05/2018  . Upper GI bleed 02/01/2018  . GI bleed 02/01/2018  . Ascites--mild this admit 11/06/2017  . Atypical chest pain 11/05/2017  . Dyslipidemia, goal LDL below 70 10/13/2017  . Acute blood loss anemia 04/20/2017  . Elevated LFTs 04/20/2017  . Coronary artery disease 03/24/2017  . S/P CABG x 2 03/24/2017  . CAD (coronary artery disease), native coronary artery 03/23/2017  . Cirrhosis ([place]) 03/16/2017  . GERD (gastroesophageal reflux disease) 01/28/2012  . Primary biliary cirrhosis ([place]) 01/28/2012  . IBS (irritable bowel syndrome) 01/28/2012  . Primary biliary cholangitis ([place]) 01/28/2012    Dawayne Cirri, SPT 01/25/2020, 11:47 AM  [facility] [address] [place], [place], [address] Phone: [phone]   Fax:  [phone]  Name: Christine Butler MRN: [id number] Date of Birth: [birth year]

## 2020-01-26 DIAGNOSIS — Z79899 Other long term (current) drug therapy: Principal | ICD-10-CM

## 2020-01-26 DIAGNOSIS — Z944 Liver transplant status: Principal | ICD-10-CM

## 2020-01-27 DIAGNOSIS — Z944 Liver transplant status: Principal | ICD-10-CM

## 2020-01-27 MED ORDER — MG-PLUS-PROTEIN 133 MG TABLET
ORAL_TABLET | Freq: Two times a day (BID) | ORAL | 11 refills | 50.00000 days | Status: CN
Start: 2020-01-27 — End: ?

## 2020-01-27 NOTE — Unmapped (Signed)
This patient has been disenrolled from the [facility] specialty pharmacy services due to no specialty medications from [facility] anymore. We were following for prednisone, however patient told team member JM today that prednisone along with other meds will now come from [facility]. Clinic is aware.Thad Ranger  [facility] Specialty Pharmacist

## 2020-01-28 NOTE — Unmapped (Signed)
Successfully faxed PT orders to [facility] that were signed by Gertie Fey, NP to fax: [phone].     Received inbasket message back from patient (from this coordinator's message sent 01/24/20):  ----- Message -----       From:Bricia Maurine Minister       Sent:01/26/2020  8:33 PM EST         UU:[id number] L Dystany Duffy    Subject:Tacrolimus levels    Denver Faster! Your message came through today at 5 am according to my email .  I am setting my alarm every day at 730 and taking my meds . I reset it until 830 and go downstairs to eat breakfast . So it???s a least an hour or a little more before I eat breakfast . I had looked up the ingredients as well in the Gatorade and I wasn???t sure about the ???natural flavors ??? thing . It doesn???t list grapefruit in [facility] either , just the natural flavor so I was just curious if you had heard anything about the Gatorade???s . That citrus cooler I can???t find anymore so the past week or so I???ve just been drinking plain orange . I saw my tac level is in for the week and it???s gone up finally . I don???t know if it???s the level you want or not though .  ~Kathy Armstrong     Sent message back to patient:  Hi.     I have not heard anything about the Gatorade and what the natural flavors include. I'm sorry that you cannot find the citrus cooler gatorade since it sounds like you really enjoyed it.     If you had said that you had not drank the citrus cooler starting around Nov 8, then I would wonder about it having grapefruit in it. With you only being off of it a week, then it probably is not that since your levels started dropping around Nov 8.     Yes, the tacrolimus level of 6 is just right as we are looking at the goal to be around 6-8.     That is interesting that it took the email a couple of days to notify you of the message. I usually do not put medication changes in MyChart unless I cannot reach someone on the phone. So, this delay was okay.     I hope you are well and have a good weekend.   Thanks for the information in your message.    Kathy Armstrong

## 2020-01-30 ENCOUNTER — Ambulatory Visit: Admit: 2020-01-30 | Discharge: 2020-02-01 | Payer: PRIVATE HEALTH INSURANCE

## 2020-01-30 DIAGNOSIS — R0781 Pleurodynia: Principal | ICD-10-CM

## 2020-01-30 DIAGNOSIS — R509 Fever, unspecified: Principal | ICD-10-CM

## 2020-01-30 DIAGNOSIS — R519 Headache, unspecified: Principal | ICD-10-CM

## 2020-01-30 DIAGNOSIS — I2510 Atherosclerotic heart disease of native coronary artery without angina pectoris: Principal | ICD-10-CM

## 2020-01-30 DIAGNOSIS — R197 Diarrhea, unspecified: Principal | ICD-10-CM

## 2020-01-30 DIAGNOSIS — Z951 Presence of aortocoronary bypass graft: Principal | ICD-10-CM

## 2020-01-30 DIAGNOSIS — K7290 Hepatic failure, unspecified without coma: Principal | ICD-10-CM

## 2020-01-30 DIAGNOSIS — Z79899 Other long term (current) drug therapy: Principal | ICD-10-CM

## 2020-01-30 DIAGNOSIS — Z944 Liver transplant status: Principal | ICD-10-CM

## 2020-01-30 DIAGNOSIS — M791 Myalgia, unspecified site: Principal | ICD-10-CM

## 2020-01-30 DIAGNOSIS — R0989 Other specified symptoms and signs involving the circulatory and respiratory systems: Principal | ICD-10-CM

## 2020-01-30 DIAGNOSIS — D849 Immunodeficiency, unspecified: Principal | ICD-10-CM

## 2020-01-30 DIAGNOSIS — Z7982 Long term (current) use of aspirin: Principal | ICD-10-CM

## 2020-01-30 DIAGNOSIS — R1032 Left lower quadrant pain: Principal | ICD-10-CM

## 2020-01-30 DIAGNOSIS — Z20822 Contact with and (suspected) exposure to covid-19: Principal | ICD-10-CM

## 2020-01-30 DIAGNOSIS — R161 Splenomegaly, not elsewhere classified: Secondary | ICD-10-CM | POA: Diagnosis not present

## 2020-01-30 DIAGNOSIS — D899 Disorder involving the immune mechanism, unspecified: Secondary | ICD-10-CM | POA: Diagnosis not present

## 2020-01-30 DIAGNOSIS — R5383 Other fatigue: Secondary | ICD-10-CM | POA: Diagnosis not present

## 2020-01-30 DIAGNOSIS — R110 Nausea: Secondary | ICD-10-CM | POA: Diagnosis not present

## 2020-01-30 MED ORDER — MG-PLUS-PROTEIN 133 MG TABLET
ORAL_TABLET | Freq: Two times a day (BID) | ORAL | 11 refills | 50 days
Start: 2020-01-30 — End: ?

## 2020-01-31 ENCOUNTER — Ambulatory Visit: Payer: 59 | Admitting: Physical Therapy

## 2020-01-31 DIAGNOSIS — Z944 Liver transplant status: Principal | ICD-10-CM

## 2020-01-31 DIAGNOSIS — Z79899 Other long term (current) drug therapy: Principal | ICD-10-CM

## 2020-01-31 DIAGNOSIS — R0989 Other specified symptoms and signs involving the circulatory and respiratory systems: Secondary | ICD-10-CM | POA: Diagnosis not present

## 2020-01-31 DIAGNOSIS — R509 Fever, unspecified: Secondary | ICD-10-CM | POA: Diagnosis not present

## 2020-01-31 DIAGNOSIS — Z5181 Encounter for therapeutic drug level monitoring: Secondary | ICD-10-CM | POA: Diagnosis not present

## 2020-02-01 DIAGNOSIS — R509 Fever, unspecified: Secondary | ICD-10-CM | POA: Diagnosis not present

## 2020-02-01 DIAGNOSIS — Z79899 Other long term (current) drug therapy: Secondary | ICD-10-CM | POA: Diagnosis not present

## 2020-02-01 DIAGNOSIS — Z944 Liver transplant status: Secondary | ICD-10-CM | POA: Diagnosis not present

## 2020-02-01 DIAGNOSIS — Z5181 Encounter for therapeutic drug level monitoring: Secondary | ICD-10-CM | POA: Diagnosis not present

## 2020-02-01 MED ORDER — MG-PLUS-PROTEIN 133 MG TABLET
ORAL_TABLET | Freq: Every day | ORAL | 11 refills | 30 days | Status: CP
Start: 2020-02-01 — End: ?
  Filled 2020-02-01: qty 100, 50d supply, fill #0

## 2020-02-01 MED ORDER — METRONIDAZOLE 500 MG TABLET
ORAL_TABLET | Freq: Three times a day (TID) | ORAL | 0 refills | 5 days | Status: CN
Start: 2020-02-01 — End: 2020-02-06

## 2020-02-01 MED ORDER — CIPROFLOXACIN 500 MG TABLET
ORAL_TABLET | Freq: Two times a day (BID) | ORAL | 0 refills | 7 days | Status: CP
Start: 2020-02-01 — End: 2020-04-16
  Filled 2020-02-01: qty 13, 7d supply, fill #0

## 2020-02-01 MED FILL — MG-PLUS-PROTEIN 133 MG TABLET: 50 days supply | Qty: 100 | Fill #0 | Status: AC

## 2020-02-01 MED FILL — CIPROFLOXACIN 500 MG TABLET: 7 days supply | Qty: 13 | Fill #0 | Status: AC

## 2020-02-02 ENCOUNTER — Ambulatory Visit: Payer: 59 | Admitting: Physical Therapy

## 2020-02-02 ENCOUNTER — Encounter: Payer: Self-pay | Admitting: Physical Therapy

## 2020-02-02 ENCOUNTER — Other Ambulatory Visit: Payer: Self-pay

## 2020-02-02 VITALS — BP 138/88 | HR 72 | Temp 97.3°F

## 2020-02-02 DIAGNOSIS — Z944 Liver transplant status: Principal | ICD-10-CM

## 2020-02-02 DIAGNOSIS — Z79899 Other long term (current) drug therapy: Principal | ICD-10-CM

## 2020-02-02 DIAGNOSIS — M62830 Muscle spasm of back: Secondary | ICD-10-CM

## 2020-02-02 DIAGNOSIS — R2689 Other abnormalities of gait and mobility: Secondary | ICD-10-CM

## 2020-02-02 DIAGNOSIS — M6281 Muscle weakness (generalized): Secondary | ICD-10-CM | POA: Diagnosis not present

## 2020-02-02 DIAGNOSIS — R2681 Unsteadiness on feet: Secondary | ICD-10-CM

## 2020-02-02 DIAGNOSIS — R293 Abnormal posture: Secondary | ICD-10-CM | POA: Diagnosis not present

## 2020-02-02 NOTE — Therapy (Signed)
[place], [place], [address] Phone: [phone]   Fax:  [phone]  Physical Therapy Treatment  Patient Details  Name: Christine Butler MRN: [id number] Date of Birth: 03-12-1970 Referring Provider (PT): Benna Dunks, NP    Encounter Date: 02/02/2020   PT End of Session - 02/02/20 1107    Visit Number 10    Number of Visits 17    Date for PT Re-Evaluation 03/19/20    Authorization Type [facility] U[facility]    PT Start Time 1100    PT Stop Time 1145    PT Time Calculation (min) 45 min    Activity Tolerance Patient tolerated treatment well    Behavior During Therapy [facility] for tasks assessed/performed           Past Medical History:  Diagnosis Date  . (HFpEF) heart failure with preserved ejection fraction ([place]) 08/09/2019  . Acute blood loss anemia 04/20/2017  . Altered mental status 08/14/2019  . Arthritis    right knee  . Ascites--mild this admit 11/06/2017   per patient , now stable with meds and lifestlye adjustment  . Atypical chest pain 11/05/2017  . Atypical squamous cells of undetermined significance (ASCUS) on Papanicolaou smear of cervix 08/05/2018  . CAD (coronary artery disease)    a. s/p CABGx2 in 02/2017 (LAD not suitable for PCI), EF normal.  . CAD (coronary artery disease), native coronary artery 03/23/2017  . 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 Cirrhosis ([place]) 03/16/2017  . Coronary artery disease 03/24/2017  . Dyslipidemia, goal LDL below 70 10/13/2017  . Elevated LFTs 04/20/2017  . Encephalopathy 09/29/2019  . Familial hyperlipidemia   . GERD (gastroesophageal reflux disease)   . GI bleed 02/01/2018  . H/O LEEP 03/30/2019  . H/O two vessel coronary artery bypass graft 03/30/2019  . High grade squamous intraepithelial lesion (HGSIL) on cytologic smear of cervix 05/04/2019  . Hypo-osmolality and hyponatremia 03/30/2019  . IBS (irritable bowel syndrome)   . Other insomnia 09/05/2019   . Pancytopenia ([place]) 09/28/2019  . PONV (postoperative nausea and vomiting)    i always throw up on waking up , but last EGD in march had no issues    . Port-A-Cath in place 11/21/2019  . Primary biliary cholangitis ([place]) 07/26/2012   Formatting of this note might be different from the original. IMOUPDATE  . Primary biliary cirrhosis (HCC)    cirhosis/liver disease followed by transplant team led by Dr Manuella Ghazi at [facility]   . S/P CABG (coronary artery bypass graft)   . S/P CABG x 2 03/24/2017   LIMA to DIAGONAL Portion of SVG/LEFT RADIAL to LAD  . S/P LEEP (loop electrosurgical excision procedure) 05/11/2019  . Status post liver transplantation ([place]) 03/30/2019  . SVD (spontaneous vaginal delivery)    x 3  . Upper GI bleed 02/01/2018  . Urinary tract bacterial infections     Past Surgical History:  Procedure Laterality Date  . CORONARY ARTERY BYPASS GRAFT N/A 03/24/2017   Procedure: CORONARY ARTERY BYPASS GRAFTING (CABG) x two, using left internal mammary artery, left radial artery, and right leg greater saphenous vein harvested endoscopically;  Surgeon: Ivin Poot, MD;  Location: [place];  Service: Open Heart Surgery;  Laterality: N/A;  . ENDOVEIN HARVEST OF GREATER SAPHENOUS VEIN Right 03/24/2017   Procedure: ENDOVEIN HARVEST OF GREATER SAPHENOUS VEIN;  Surgeon: Ivin Poot, MD;  Location: [place];  Service: Open Heart Surgery;  Laterality: Right;  .  ESOPHAGEAL BANDING  02/01/2018   Procedure: ESOPHAGEAL BANDING;  Surgeon: [name], MD;  Location: [facility];  Service: Gastroenterology;;  . ESOPHAGEAL BANDING N/A 03/29/2018   Procedure: ESOPHAGEAL BANDING;  Surgeon: [name], MD;  Location: [facility];  Service: Gastroenterology;  Laterality: N/A;  . ESOPHAGEAL BANDING N/A 05/21/2018   Procedure: ESOPHAGEAL BANDING;  Surgeon: [name], MD;  Location: [facility];  Service: Gastroenterology;  Laterality: N/A;  . ESOPHAGEAL BANDING N/A 10/11/2018   Procedure: ESOPHAGEAL BANDING;   Surgeon: [name], MD;  Location: [facility];  Service: Gastroenterology;  Laterality: N/A;  . ESOPHAGOGASTRODUODENOSCOPY N/A 03/29/2018   Procedure: ESOPHAGOGASTRODUODENOSCOPY (EGD);  Surgeon: [name], MD;  Location: [facility];  Service: Gastroenterology;  Laterality: N/A;  . ESOPHAGOGASTRODUODENOSCOPY N/A 05/21/2018   Procedure: ESOPHAGOGASTRODUODENOSCOPY (EGD);  Surgeon: [name], MD;  Location: [facility];  Service: Gastroenterology;  Laterality: N/A;  . ESOPHAGOGASTRODUODENOSCOPY (EGD) WITH PROPOFOL N/A 02/01/2018   Procedure: ESOPHAGOGASTRODUODENOSCOPY (EGD) WITH PROPOFOL;  Surgeon: [name], MD;  Location: [facility];  Service: Gastroenterology;  Laterality: N/A;  . ESOPHAGOGASTRODUODENOSCOPY (EGD) WITH PROPOFOL N/A 10/11/2018   Procedure: ESOPHAGOGASTRODUODENOSCOPY (EGD) WITH PROPOFOL;  Surgeon: [name], MD;  Location: [facility];  Service: Gastroenterology;  Laterality: N/A;  . INCONTINENCE SURGERY    . IR IMAGING GUIDED PORT INSERTION  04/08/2019  . LEEP N/A 03/28/2014   Procedure: LOOP ELECTROSURGICAL EXCISION PROCEDURE (LEEP) cone biopsy;  Surgeon: Cyril Mourning, MD;  Location: [facility];  Service: Gynecology;  Laterality: N/A;  . LEFT HEART CATH AND CORONARY ANGIOGRAPHY N/A 03/23/2017   Procedure: LEFT HEART CATH AND CORONARY ANGIOGRAPHY;  Surgeon: Jettie Booze, MD;  Location: [facility];  Service: Cardiovascular;  Laterality: N/A;  . LIVER BIOPSY     x 2  . RADIAL ARTERY HARVEST Left 03/24/2017   Procedure: RADIAL ARTERY HARVEST;  Surgeon: Ivin Poot, MD;  Location: [facility];  Service: Open Heart Surgery;  Laterality: Left;  . TEE WITHOUT CARDIOVERSION N/A 03/24/2017   Procedure: TRANSESOPHAGEAL ECHOCARDIOGRAM (TEE);  Surgeon: Prescott Gum, Collier Salina, MD;  Location: [place];  Service: Open Heart Surgery;  Laterality: N/A;  . WISDOM TOOTH EXTRACTION      Vitals:   02/02/20 1104  BP: 138/88  Pulse: 72  Temp: (!) 97.3 F (36.3 C)  SpO2: 99%      Subjective Assessment - 02/02/20 1106    Subjective "I had to go to the ER and stay overnight and they said it was a respitory virus. I just got home yesterday around 7pm and I didn't want to come this morning but I did anyway. My back hurts from laying in the hospital bed.    Pertinent History CAD, liver tranplants    Limitations Standing;Walking;Sitting    How long can you sit comfortably? 1 hour    How long can you stand comfortably? 15 min    How long can you walk comfortably? 15 min    Patient Stated Goals get back to walking a mile, increase stamina, promote strength and return work.    Currently in Pain? No/denies   sore and stiff back   Pain Location Back    Pain Onset More than a month ago              [facility] PT Assessment - 02/02/20 0001      Observation/Other Assessments   Focus on Therapeutic Outcomes (FOTO)  42% limited 02/02/2020  [facility] [place] [facility] PT Treatment/Exercise - 02/02/20 0001      Lumbar Exercises: Supine   Dead Bug 20 reps    Dead Bug Limitations hips 90-90, bring hands from overhead to tap knee on same side    Bridge 10 reps;1 second      Knee/Hip Exercises: Stretches   Hip Flexor Stretch Both;4 reps    Piriformis Stretch Both;4 reps;30 seconds      Knee/Hip Exercises: Standing   Hip Abduction AROM;Stengthening;Both;1 set;10 reps;Knee bent    Abduction Limitations Standing sidestepping with red theraband around ankles    Forward Step Up Both;2 sets;10 reps;Hand Hold: 1;Step Height: 2"      Knee/Hip Exercises: Seated   Marching AROM;Both;1 set;10 reps      Shoulder Exercises: Seated   Other Seated Exercises Palloff press with HABD and HADD on green swiss ball 2x10    Other Seated Exercises bicep curl to shoulder press 1# weights bilateral seated on green swiss ball 2x10      Shoulder Exercises: Standing   Other Standing Exercises standing swiss ball abdominal UE press into mat 2x10 along with breathing                   PT Education - 02/02/20 1145    Education Details Slow progression back to exercising after hospital visit    Person(s) Educated Patient    Methods Explanation    Comprehension Verbalized understanding            PT Short Term Goals - 01/23/20 1146      PT SHORT TERM GOAL #1   Title pt to be IND with initial HEP    Time 4    Period Weeks    Status Achieved    Target Date 12/22/19      PT SHORT TERM GOAL #2   Title increase bil hip abductor / extensor strength to >/ 3-/5 to promote hip stability    Time 4    Period Weeks    Status Achieved    Target Date 12/22/19      PT SHORT TERM GOAL #3   Title increase 5 x sit to stand  and TUG to </= 13 seconds to work toward functional / safe transitions    Time 4    Period Weeks    Status Achieved    Target Date 12/22/19             PT Long Term Goals - 01/23/20 1148      PT LONG TERM GOAL #1   Title increase bil LE gross strength to 4+/5 to promtoe hip/ knee stability and promote efficient posture    Time 8    Period Weeks    Status Partially Met      PT LONG TERM GOAL #2   Title increase bil Gross UE strength to >/= 4+/5 to assist with functional lifting/ carrying / pushing and pull as it relates to potential work related tasks.    Time 8    Period Weeks    Status Partially Met      PT LONG TERM GOAL #3   Title improve 5 x STS to </= 12 seconds and TUG to </= 10 seconds to for functiona/ efficent and safe mobility    Time 8    Period Weeks    Status Achieved      PT LONG TERM GOAL #4   Title pt to be able to stand and walk for >/= 1 hour  for functional endurnace required for work tasks and community ambulation with no report of pain    Time 8    Period Weeks    Status Achieved      PT LONG TERM GOAL #5   Title increase FOTO score to </=44% limited to demo improvement in function    Baseline 57% limited at initial eval    Time 8    Period Weeks    Status Partially Met      PT LONG TERM  GOAL #6   Title pt to be IND with all HEP to maintain and progress current LOF IND    Time 8    Period Weeks    Status Partially Met                 Plan - 02/02/20 1146    Clinical Impression Statement Pt returns to OPPT after overnight hospital stay for respiratory virus and denies symptoms. Focus of today's session was on light return to exercise with lumbar and hip stretching and strengthening, with UE activities performed on green swiss ball to incorporate core. Today's session was well-tolerated with pt reporting decrease in lumbar stiffness.    Personal Factors and Comorbidities Comorbidity 1    Comorbidities hx of liver transplant, anemia, significant PMHx/ surgical hx    Stability/Clinical Decision Making Evolving/Moderate complexity    Rehab Potential Good    PT Frequency 2x / week    PT Duration 8 weeks    PT Treatment/Interventions ADLs/Self Care Home Management;Moist Heat;Cryotherapy;Gait training;Stair training;Functional mobility training;Therapeutic activities;Therapeutic exercise;Balance training;Neuromuscular re-education;Manual techniques;Passive range of motion;Taping;Patient/family education    PT Next Visit Plan Hip strengthening (esp ABD and EXT), Core strengthening, UE work,    PT Home Exercise Plan [id number] - low back stretch (seated walking hands on chair), sit to stand with hands on knees, lower trunk rotations, standing hip flexor stretch, STS, Heel taps off step, wall squat, anti-rotation step outs,    Consulted and Agree with Plan of Care Patient           Patient will benefit from skilled therapeutic intervention in order to improve the following deficits and impairments:  Abnormal gait,Improper body mechanics,Postural dysfunction,Increased muscle spasms,Decreased strength,Pain,Decreased activity tolerance,Decreased balance,Decreased endurance  Visit Diagnosis: Muscle weakness (generalized)  Other abnormalities of gait and mobility  Muscle spasm  of back  Abnormal posture  Unsteadiness on feet     Problem List Patient Active Problem List   Diagnosis Date Noted  . S/P CABG (coronary artery bypass graft)   . Port-A-Cath in place 11/21/2019  . Encephalopathy 09/29/2019  . Pancytopenia ([place]) 09/28/2019  . Arthritis 09/21/2019  . PONV (postoperative nausea and vomiting) 09/21/2019  . SVD (spontaneous vaginal delivery) 09/21/2019  . Urinary tract bacterial infections 09/21/2019  . CAD (coronary artery disease)   . Other insomnia 09/05/2019  . 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 Altered mental status 08/14/2019  . (HFpEF) heart failure with preserved ejection fraction ([place]) 08/09/2019  . S/P LEEP (loop electrosurgical excision procedure) 05/11/2019  . High grade squamous intraepithelial lesion (HGSIL) on cytologic smear of cervix 05/04/2019  . Familial hyperlipidemia 03/30/2019  . H/O two vessel coronary artery bypass graft 03/30/2019  . Status post liver transplantation ([place]) 03/30/2019  . Hypo-osmolality and hyponatremia 03/30/2019  . Atypical squamous cells of undetermined significance (ASCUS) on Papanicolaou smear of cervix 08/05/2018  . Upper GI bleed 02/01/2018  . GI bleed 02/01/2018  . Ascites--mild this admit 11/06/2017  . Atypical chest pain 11/05/2017  .  Dyslipidemia, goal LDL below 70 10/13/2017  . Acute blood loss anemia 04/20/2017  . Elevated LFTs 04/20/2017  . Coronary artery disease 03/24/2017  . S/P CABG x 2 03/24/2017  . CAD (coronary artery disease), native coronary artery 03/23/2017  . Cirrhosis ([place]) 03/16/2017  . GERD (gastroesophageal reflux disease) 01/28/2012  . Primary biliary cirrhosis ([place]) 01/28/2012  . IBS (irritable bowel syndrome) 01/28/2012  . Primary biliary cholangitis ([place]) 01/28/2012    Christine Butler, SPT 02/02/2020, 11:49 AM  [facility] [address] [place], [place], [address] Phone: [phone]   Fax:  [phone]  Name: Christine Butler MRN: [id number] Date of Birth: [birth year]

## 2020-02-06 DIAGNOSIS — Z79899 Other long term (current) drug therapy: Principal | ICD-10-CM

## 2020-02-06 DIAGNOSIS — Z944 Liver transplant status: Principal | ICD-10-CM

## 2020-02-07 DIAGNOSIS — Z944 Liver transplant status: Principal | ICD-10-CM

## 2020-02-07 DIAGNOSIS — Z79899 Other long term (current) drug therapy: Principal | ICD-10-CM

## 2020-02-08 DIAGNOSIS — R52 Pain, unspecified: Principal | ICD-10-CM

## 2020-02-08 DIAGNOSIS — B1081 Human herpesvirus 6 infection: Principal | ICD-10-CM

## 2020-02-08 DIAGNOSIS — R509 Fever, unspecified: Principal | ICD-10-CM

## 2020-02-08 DIAGNOSIS — M791 Myalgia, unspecified site: Principal | ICD-10-CM

## 2020-02-08 DIAGNOSIS — R5383 Other fatigue: Principal | ICD-10-CM

## 2020-02-08 DIAGNOSIS — B259 Cytomegaloviral disease, unspecified: Principal | ICD-10-CM

## 2020-02-08 DIAGNOSIS — Z944 Liver transplant status: Principal | ICD-10-CM

## 2020-02-08 DIAGNOSIS — B2790 Infectious mononucleosis, unspecified without complication: Principal | ICD-10-CM

## 2020-02-08 DIAGNOSIS — B459 Cryptococcosis, unspecified: Principal | ICD-10-CM

## 2020-02-09 ENCOUNTER — Institutional Professional Consult (permissible substitution): Admit: 2020-02-09 | Discharge: 2020-02-09 | Payer: PRIVATE HEALTH INSURANCE

## 2020-02-09 ENCOUNTER — Ambulatory Visit
Admit: 2020-02-09 | Discharge: 2020-02-09 | Payer: PRIVATE HEALTH INSURANCE | Attending: Physician Assistant | Primary: Physician Assistant

## 2020-02-09 ENCOUNTER — Ambulatory Visit: Admit: 2020-02-09 | Discharge: 2020-02-09 | Payer: PRIVATE HEALTH INSURANCE

## 2020-02-09 DIAGNOSIS — Z944 Liver transplant status: Principal | ICD-10-CM

## 2020-02-09 DIAGNOSIS — R0989 Other specified symptoms and signs involving the circulatory and respiratory systems: Principal | ICD-10-CM

## 2020-02-09 DIAGNOSIS — Z7982 Long term (current) use of aspirin: Principal | ICD-10-CM

## 2020-02-09 DIAGNOSIS — M791 Myalgia, unspecified site: Principal | ICD-10-CM

## 2020-02-09 DIAGNOSIS — R5383 Other fatigue: Principal | ICD-10-CM

## 2020-02-09 DIAGNOSIS — R519 Headache, unspecified: Principal | ICD-10-CM

## 2020-02-09 DIAGNOSIS — Z20822 Contact with and (suspected) exposure to covid-19: Principal | ICD-10-CM

## 2020-02-09 DIAGNOSIS — R6250 Unspecified lack of expected normal physiological development in childhood: Principal | ICD-10-CM

## 2020-02-09 DIAGNOSIS — Z602 Problems related to living alone: Principal | ICD-10-CM

## 2020-02-09 DIAGNOSIS — B1081 Human herpesvirus 6 infection: Principal | ICD-10-CM

## 2020-02-09 DIAGNOSIS — R197 Diarrhea, unspecified: Principal | ICD-10-CM

## 2020-02-09 DIAGNOSIS — R030 Elevated blood-pressure reading, without diagnosis of hypertension: Principal | ICD-10-CM

## 2020-02-09 DIAGNOSIS — Z951 Presence of aortocoronary bypass graft: Principal | ICD-10-CM

## 2020-02-09 DIAGNOSIS — B2790 Infectious mononucleosis, unspecified without complication: Principal | ICD-10-CM

## 2020-02-09 DIAGNOSIS — Z79899 Other long term (current) drug therapy: Principal | ICD-10-CM

## 2020-02-09 DIAGNOSIS — K859 Acute pancreatitis without necrosis or infection, unspecified: Principal | ICD-10-CM

## 2020-02-09 DIAGNOSIS — I2510 Atherosclerotic heart disease of native coronary artery without angina pectoris: Principal | ICD-10-CM

## 2020-02-09 DIAGNOSIS — Z95828 Presence of other vascular implants and grafts: Principal | ICD-10-CM

## 2020-02-09 DIAGNOSIS — B259 Cytomegaloviral disease, unspecified: Principal | ICD-10-CM

## 2020-02-09 DIAGNOSIS — R509 Fever, unspecified: Principal | ICD-10-CM

## 2020-02-09 DIAGNOSIS — Z20828 Contact with and (suspected) exposure to other viral communicable diseases: Principal | ICD-10-CM

## 2020-02-09 DIAGNOSIS — B459 Cryptococcosis, unspecified: Principal | ICD-10-CM

## 2020-02-09 DIAGNOSIS — N179 Acute kidney failure, unspecified: Principal | ICD-10-CM

## 2020-02-09 DIAGNOSIS — R52 Pain, unspecified: Principal | ICD-10-CM

## 2020-02-10 ENCOUNTER — Other Ambulatory Visit (HOSPITAL_COMMUNITY): Payer: Self-pay

## 2020-02-10 DIAGNOSIS — Z944 Liver transplant status: Principal | ICD-10-CM

## 2020-02-10 MED ORDER — TACROLIMUS XR 4 MG TABLET,EXTENDED RELEASE 24 HR
ORAL_TABLET | Freq: Every day | ORAL | 11 refills | 30 days | Status: CP
Start: 2020-02-10 — End: ?

## 2020-02-10 MED ORDER — SULFAMETHOXAZOLE 400 MG-TRIMETHOPRIM 80 MG TABLET
ORAL_TABLET | ORAL | 2 refills | 28.00000 days | Status: CP
Start: 2020-02-10 — End: 2020-05-10

## 2020-02-12 MED FILL — predniSONE 5 MG TABS: 5 | 30 days supply | Qty: 30 | Fill #1

## 2020-02-13 MED FILL — CARVEDILOL 25 MG TABS: 25 | 30 days supply | Qty: 90 | Fill #1

## 2020-02-14 DIAGNOSIS — Z944 Liver transplant status: Principal | ICD-10-CM

## 2020-02-14 DIAGNOSIS — Z79899 Other long term (current) drug therapy: Principal | ICD-10-CM

## 2020-02-14 MED FILL — MIRTAZAPINE 15 MG TABS: 15 | 30 days supply | Qty: 30 | Fill #1

## 2020-02-14 MED FILL — OMEPRAZOLE 40 MG CPDR: 40 | 30 days supply | Qty: 30 | Fill #0

## 2020-02-14 MED FILL — ENVARSUS XR 4 MG TB24: 4 | 30 days supply | Qty: 60 | Fill #0

## 2020-02-16 DIAGNOSIS — Z79899 Other long term (current) drug therapy: Principal | ICD-10-CM

## 2020-02-16 DIAGNOSIS — Z944 Liver transplant status: Principal | ICD-10-CM

## 2020-02-20 DIAGNOSIS — Z944 Liver transplant status: Principal | ICD-10-CM

## 2020-02-20 DIAGNOSIS — Z79899 Other long term (current) drug therapy: Principal | ICD-10-CM

## 2020-02-21 DIAGNOSIS — Z944 Liver transplant status: Principal | ICD-10-CM

## 2020-02-21 DIAGNOSIS — Z79899 Other long term (current) drug therapy: Principal | ICD-10-CM

## 2020-02-22 DIAGNOSIS — Z79899 Other long term (current) drug therapy: Secondary | ICD-10-CM | POA: Diagnosis not present

## 2020-02-22 DIAGNOSIS — Z944 Liver transplant status: Secondary | ICD-10-CM | POA: Diagnosis not present

## 2020-02-23 DIAGNOSIS — Z944 Liver transplant status: Principal | ICD-10-CM

## 2020-02-23 DIAGNOSIS — Z79899 Other long term (current) drug therapy: Principal | ICD-10-CM

## 2020-02-27 DIAGNOSIS — Z944 Liver transplant status: Principal | ICD-10-CM

## 2020-02-27 DIAGNOSIS — Z79899 Other long term (current) drug therapy: Principal | ICD-10-CM

## 2020-02-28 ENCOUNTER — Encounter: Payer: Self-pay | Admitting: Physical Therapy

## 2020-02-28 ENCOUNTER — Other Ambulatory Visit: Payer: Self-pay

## 2020-02-28 ENCOUNTER — Ambulatory Visit: Payer: 59 | Attending: Family Medicine | Admitting: Physical Therapy

## 2020-02-28 DIAGNOSIS — Z951 Presence of aortocoronary bypass graft: Principal | ICD-10-CM

## 2020-02-28 DIAGNOSIS — K831 Obstruction of bile duct: Principal | ICD-10-CM

## 2020-02-28 DIAGNOSIS — Z79899 Other long term (current) drug therapy: Principal | ICD-10-CM

## 2020-02-28 DIAGNOSIS — Z944 Liver transplant status: Principal | ICD-10-CM

## 2020-02-28 DIAGNOSIS — K9189 Other postprocedural complications and disorders of digestive system: Principal | ICD-10-CM

## 2020-02-28 DIAGNOSIS — K219 Gastro-esophageal reflux disease without esophagitis: Principal | ICD-10-CM

## 2020-02-28 DIAGNOSIS — K449 Diaphragmatic hernia without obstruction or gangrene: Principal | ICD-10-CM

## 2020-02-28 DIAGNOSIS — Z4659 Encounter for fitting and adjustment of other gastrointestinal appliance and device: Principal | ICD-10-CM

## 2020-02-28 DIAGNOSIS — I2510 Atherosclerotic heart disease of native coronary artery without angina pectoris: Principal | ICD-10-CM

## 2020-02-28 DIAGNOSIS — R2689 Other abnormalities of gait and mobility: Secondary | ICD-10-CM | POA: Insufficient documentation

## 2020-02-28 DIAGNOSIS — M6281 Muscle weakness (generalized): Secondary | ICD-10-CM | POA: Insufficient documentation

## 2020-02-28 DIAGNOSIS — M62830 Muscle spasm of back: Secondary | ICD-10-CM | POA: Insufficient documentation

## 2020-02-28 DIAGNOSIS — R2681 Unsteadiness on feet: Secondary | ICD-10-CM | POA: Insufficient documentation

## 2020-02-28 DIAGNOSIS — R293 Abnormal posture: Secondary | ICD-10-CM | POA: Diagnosis not present

## 2020-02-28 NOTE — Therapy (Signed)
[place], [place], [address] Phone: [phone]   Fax:  [phone]  Physical Therapy Treatment  Patient Details  Name: Christine Butler MRN: [id number] Date of Birth: 01/01/1971 Referring Provider (PT): Lauralee Evener Placido Sou, NP    Encounter Date: 02/28/2020   PT End of Session - 02/28/20 0900    Visit Number 11    Number of Visits 17    Date for PT Re-Evaluation 03/19/20    Authorization Type [facility] U[facility]    PT Start Time 0853    PT Stop Time 0931    PT Time Calculation (min) 38 min    Activity Tolerance Patient tolerated treatment well    Behavior During Therapy [facility] for tasks assessed/performed           Past Medical History:  Diagnosis Date  . (HFpEF) heart failure with preserved ejection fraction ([place]) 08/09/2019  . Acute blood loss anemia 04/20/2017  . Altered mental status 08/14/2019  . Arthritis    right knee  . Ascites--mild this admit 11/06/2017   per patient , now stable with meds and lifestlye adjustment  . Atypical chest pain 11/05/2017  . Atypical squamous cells of undetermined significance (ASCUS) on Papanicolaou smear of cervix 08/05/2018  . CAD (coronary artery disease)    a. s/p CABGx2 in 02/2017 (LAD not suitable for PCI), EF normal.  . CAD (coronary artery disease), native coronary artery 03/23/2017  . 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 Cirrhosis ([place]) 03/16/2017  . Coronary artery disease 03/24/2017  . Dyslipidemia, goal LDL below 70 10/13/2017  . Elevated LFTs 04/20/2017  . Encephalopathy 09/29/2019  . Familial hyperlipidemia   . GERD (gastroesophageal reflux disease)   . GI bleed 02/01/2018  . H/O LEEP 03/30/2019  . H/O two vessel coronary artery bypass graft 03/30/2019  . High grade squamous intraepithelial lesion (HGSIL) on cytologic smear of cervix 05/04/2019  . Hypo-osmolality and hyponatremia 03/30/2019  . IBS (irritable bowel syndrome)   . Other insomnia 09/05/2019   . Pancytopenia ([place]) 09/28/2019  . PONV (postoperative nausea and vomiting)    i always throw up on waking up , but last EGD in march had no issues    . Port-A-Cath in place 11/21/2019  . Primary biliary cholangitis ([place]) 07/26/2012   Formatting of this note might be different from the original. IMOUPDATE  . Primary biliary cirrhosis (HCC)    cirhosis/liver disease followed by transplant team led by Dr Manuella Ghazi at [facility]   . S/P CABG (coronary artery bypass graft)   . S/P CABG x 2 03/24/2017   LIMA to DIAGONAL Portion of SVG/LEFT RADIAL to LAD  . S/P LEEP (loop electrosurgical excision procedure) 05/11/2019  . Status post liver transplantation ([place]) 03/30/2019  . SVD (spontaneous vaginal delivery)    x 3  . Upper GI bleed 02/01/2018  . Urinary tract bacterial infections     Past Surgical History:  Procedure Laterality Date  . CORONARY ARTERY BYPASS GRAFT N/A 03/24/2017   Procedure: CORONARY ARTERY BYPASS GRAFTING (CABG) x two, using left internal mammary artery, left radial artery, and right leg greater saphenous vein harvested endoscopically;  Surgeon: Ivin Poot, MD;  Location: [facility];  Service: Open Heart Surgery;  Laterality: N/A;  . ENDOVEIN HARVEST OF GREATER SAPHENOUS VEIN Right 03/24/2017   Procedure: ENDOVEIN HARVEST OF GREATER SAPHENOUS VEIN;  Surgeon: Ivin Poot, MD;  Location: [place];  Service: Open Heart Surgery;  Laterality: Right;  .  ESOPHAGEAL BANDING  02/01/2018   Procedure: ESOPHAGEAL BANDING;  Surgeon: [name], MD;  Location: [facility];  Service: Gastroenterology;;  . ESOPHAGEAL BANDING N/A 03/29/2018   Procedure: ESOPHAGEAL BANDING;  Surgeon: [name], MD;  Location: [facility];  Service: Gastroenterology;  Laterality: N/A;  . ESOPHAGEAL BANDING N/A 05/21/2018   Procedure: ESOPHAGEAL BANDING;  Surgeon: [name], MD;  Location: [facility];  Service: Gastroenterology;  Laterality: N/A;  . ESOPHAGEAL BANDING N/A 10/11/2018   Procedure: ESOPHAGEAL BANDING;   Surgeon: [name], MD;  Location: [facility];  Service: Gastroenterology;  Laterality: N/A;  . ESOPHAGOGASTRODUODENOSCOPY N/A 03/29/2018   Procedure: ESOPHAGOGASTRODUODENOSCOPY (EGD);  Surgeon: [name], MD;  Location: [facility];  Service: Gastroenterology;  Laterality: N/A;  . ESOPHAGOGASTRODUODENOSCOPY N/A 05/21/2018   Procedure: ESOPHAGOGASTRODUODENOSCOPY (EGD);  Surgeon: [name], MD;  Location: [facility];  Service: Gastroenterology;  Laterality: N/A;  . ESOPHAGOGASTRODUODENOSCOPY (EGD) WITH PROPOFOL N/A 02/01/2018   Procedure: ESOPHAGOGASTRODUODENOSCOPY (EGD) WITH PROPOFOL;  Surgeon: [name], MD;  Location: [facility];  Service: Gastroenterology;  Laterality: N/A;  . ESOPHAGOGASTRODUODENOSCOPY (EGD) WITH PROPOFOL N/A 10/11/2018   Procedure: ESOPHAGOGASTRODUODENOSCOPY (EGD) WITH PROPOFOL;  Surgeon: [name], MD;  Location: [facility];  Service: Gastroenterology;  Laterality: N/A;  . INCONTINENCE SURGERY    . IR IMAGING GUIDED PORT INSERTION  04/08/2019  . LEEP N/A 03/28/2014   Procedure: LOOP ELECTROSURGICAL EXCISION PROCEDURE (LEEP) cone biopsy;  Surgeon: Cyril Mourning, MD;  Location: [facility];  Service: Gynecology;  Laterality: N/A;  . LEFT HEART CATH AND CORONARY ANGIOGRAPHY N/A 03/23/2017   Procedure: LEFT HEART CATH AND CORONARY ANGIOGRAPHY;  Surgeon: Jettie Booze, MD;  Location: [facility];  Service: Cardiovascular;  Laterality: N/A;  . LIVER BIOPSY     x 2  . RADIAL ARTERY HARVEST Left 03/24/2017   Procedure: RADIAL ARTERY HARVEST;  Surgeon: Ivin Poot, MD;  Location: [facility];  Service: Open Heart Surgery;  Laterality: Left;  . TEE WITHOUT CARDIOVERSION N/A 03/24/2017   Procedure: TRANSESOPHAGEAL ECHOCARDIOGRAM (TEE);  Surgeon: Prescott Gum, Collier Salina, MD;  Location: [facility];  Service: Open Heart Surgery;  Laterality: N/A;  . WISDOM TOOTH EXTRACTION      There were no vitals filed for this visit.   Subjective Assessment - 02/28/20 0854     Subjective Patient has had a fever for about a month. They have ruled out a lot of causes but still dont know what it is. She has been having increased back neck and wrist pain Right wrist > Left.    Pertinent History CAD, liver tranplants    Limitations Standing;Walking;Sitting    How long can you sit comfortably? 1 hour    How long can you stand comfortably? 15 min    How long can you walk comfortably? 15 min    Patient Stated Goals get back to walking a mile, increase stamina, promote strength and return work.    Currently in Pain? Yes    Pain Score 3     Pain Location Back    Pain Orientation Lower    Pain Descriptors / Indicators Aching    Pain Type Chronic pain    Pain Radiating Towards no raidaiting pain    Pain Onset More than a month ago    Pain Frequency Intermittent    Aggravating Factors  walking longer distances    Pain Relieving Factors stretching and popping    Effect of Pain on Daily Activities reviewed HEp and symptom management    Multiple  Pain Sites No                             [facility] Adult PT Treatment/Exercise - 02/28/20 0001      Knee/Hip Exercises: Aerobic   Nustep L5 x 4 min UE & LE both (UE at 8)      Knee/Hip Exercises: Seated   Long Arc Quad 20 reps    Long Arc Quad Weight 2 lbs.    Hamstring Limitations green x20 each leg    Abd/Adduction Limitations clam shell green 2x20      Shoulder Exercises: Seated   Other Seated Exercises bilateral er x20 red; horizpontal abduction x20 red; shoulder flexion with band x20 red      Shoulder Exercises: Standing   Other Standing Exercises shoulder extension x20; scap retraction x20 red      Shoulder Exercises: Stretch   Other Shoulder Stretches upper trap stretch 3x20 sec hold; levator stretch 3x20 sec hold                  PT Education - 02/28/20 0859    Education Details reviewed stretching for neck and upper back    Person(s) Educated Patient    Methods  Explanation;Demonstration;Tactile cues;Verbal cues    Comprehension Verbalized understanding;Verbal cues required;Returned demonstration;Tactile cues required            PT Short Term Goals - 01/23/20 1146      PT SHORT TERM GOAL #1   Title pt to be IND with initial HEP    Time 4    Period Weeks    Status Achieved    Target Date 12/22/19      PT SHORT TERM GOAL #2   Title increase bil hip abductor / extensor strength to >/ 3-/5 to promote hip stability    Time 4    Period Weeks    Status Achieved    Target Date 12/22/19      PT SHORT TERM GOAL #3   Title increase 5 x sit to stand  and TUG to </= 13 seconds to work toward functional / safe transitions    Time 4    Period Weeks    Status Achieved    Target Date 12/22/19             PT Long Term Goals - 01/23/20 1148      PT LONG TERM GOAL #1   Title increase bil LE gross strength to 4+/5 to promtoe hip/ knee stability and promote efficient posture    Time 8    Period Weeks    Status Partially Met      PT LONG TERM GOAL #2   Title increase bil Gross UE strength to >/= 4+/5 to assist with functional lifting/ carrying / pushing and pull as it relates to potential work related tasks.    Time 8    Period Weeks    Status Partially Met      PT LONG TERM GOAL #3   Title improve 5 x STS to </= 12 seconds and TUG to </= 10 seconds to for functiona/ efficent and safe mobility    Time 8    Period Weeks    Status Achieved      PT LONG TERM GOAL #4   Title pt to be able to stand and walk for >/= 1 hour for functional endurnace required for work tasks and community ambulation with no report of  pain    Time 8    Period Weeks    Status Achieved      PT LONG TERM GOAL #5   Title increase FOTO score to </=44% limited to demo improvement in function    Baseline 57% limited at initial eval    Time 8    Period Weeks    Status Partially Met      PT LONG TERM GOAL #6   Title pt to be IND with all HEP to maintain and  progress current LOF IND    Time 8    Period Weeks    Status Partially Met                 Plan - 02/28/20 1312    Clinical Impression Statement Patient has not been to therapy for several weeks 2nd to various medical issues. Depsite medical isses she tolerated therapy well. She reports some back and neck pain. She reported that the back stretches helepd. She was given some neck stretches as well. She was advised to ttry when she is having increase neck pain.    Personal Factors and Comorbidities Comorbidity 1    Comorbidities hx of liver transplant, anemia, significant PMHx/ surgical hx    Stability/Clinical Decision Making Evolving/Moderate complexity    Clinical Decision Making Moderate    Rehab Potential Good    PT Frequency 2x / week    PT Duration 8 weeks    PT Treatment/Interventions ADLs/Self Care Home Management;Moist Heat;Cryotherapy;Gait training;Stair training;Functional mobility training;Therapeutic activities;Therapeutic exercise;Balance training;Neuromuscular re-education;Manual techniques;Passive range of motion;Taping;Patient/family education    PT Next Visit Plan Hip strengthening (esp ABD and EXT), Core strengthening, UE work,    PT Home Exercise Plan [phone] - low back stretch (seated walking hands on chair), sit to stand with hands on knees, lower trunk rotations, standing hip flexor stretch, STS, Heel taps off step, wall squat, anti-rotation step outs,    Consulted and Agree with Plan of Care Patient           Patient will benefit from skilled therapeutic intervention in order to improve the following deficits and impairments:  Abnormal gait,Improper body mechanics,Postural dysfunction,Increased muscle spasms,Decreased strength,Pain,Decreased activity tolerance,Decreased balance,Decreased endurance  Visit Diagnosis: Muscle weakness (generalized)  Other abnormalities of gait and mobility  Muscle spasm of back  Abnormal posture  Unsteadiness on  feet     Problem List Patient Active Problem List   Diagnosis Date Noted  . S/P CABG (coronary artery bypass graft)   . Port-A-Cath in place 11/21/2019  . Encephalopathy 09/29/2019  . Pancytopenia ([place]) 09/28/2019  . Arthritis 09/21/2019  . PONV (postoperative nausea and vomiting) 09/21/2019  . SVD (spontaneous vaginal delivery) 09/21/2019  . Urinary tract bacterial infections 09/21/2019  . CAD (coronary artery disease)   . Other insomnia 09/05/2019  . 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 Altered mental status 08/14/2019  . (HFpEF) heart failure with preserved ejection fraction ([place]) 08/09/2019  . S/P LEEP (loop electrosurgical excision procedure) 05/11/2019  . High grade squamous intraepithelial lesion (HGSIL) on cytologic smear of cervix 05/04/2019  . Familial hyperlipidemia 03/30/2019  . H/O two vessel coronary artery bypass graft 03/30/2019  . Status post liver transplantation ([place]) 03/30/2019  . Hypo-osmolality and hyponatremia 03/30/2019  . Atypical squamous cells of undetermined significance (ASCUS) on Papanicolaou smear of cervix 08/05/2018  . Upper GI bleed 02/01/2018  . GI bleed 02/01/2018  . Ascites--mild this admit 11/06/2017  . Atypical chest pain 11/05/2017  . Dyslipidemia, goal LDL below 70  10/13/2017  . Acute blood loss anemia 04/20/2017  . Elevated LFTs 04/20/2017  . Coronary artery disease 03/24/2017  . S/P CABG x 2 03/24/2017  . CAD (coronary artery disease), native coronary artery 03/23/2017  . Cirrhosis ([place]) 03/16/2017  . GERD (gastroesophageal reflux disease) 01/28/2012  . Primary biliary cirrhosis ([place]) 01/28/2012  . IBS (irritable bowel syndrome) 01/28/2012  . Primary biliary cholangitis ([place]) 01/28/2012    Carney Living PT DPT  02/28/2020, 1:14 PM  [facility] [address] [place], [place], [address] Phone: [phone]   Fax:  [phone]  Name: Christine Butler MRN: [id number] Date of Birth:  1970/12/12

## 2020-02-29 MED FILL — MYCOPHENOLATE SODIUM 180 MG: 180 | 30 days supply | Qty: 60 | Fill #2

## 2020-03-01 DIAGNOSIS — Z944 Liver transplant status: Principal | ICD-10-CM

## 2020-03-01 DIAGNOSIS — Z79899 Other long term (current) drug therapy: Principal | ICD-10-CM

## 2020-03-02 ENCOUNTER — Encounter: Payer: Self-pay | Admitting: Physical Therapy

## 2020-03-02 ENCOUNTER — Other Ambulatory Visit: Payer: Self-pay

## 2020-03-02 ENCOUNTER — Ambulatory Visit: Payer: 59 | Admitting: Physical Therapy

## 2020-03-02 DIAGNOSIS — R2681 Unsteadiness on feet: Secondary | ICD-10-CM | POA: Diagnosis not present

## 2020-03-02 DIAGNOSIS — R2689 Other abnormalities of gait and mobility: Secondary | ICD-10-CM | POA: Diagnosis not present

## 2020-03-02 DIAGNOSIS — R293 Abnormal posture: Secondary | ICD-10-CM | POA: Diagnosis not present

## 2020-03-02 DIAGNOSIS — M62830 Muscle spasm of back: Secondary | ICD-10-CM | POA: Diagnosis not present

## 2020-03-02 DIAGNOSIS — M6281 Muscle weakness (generalized): Secondary | ICD-10-CM

## 2020-03-02 NOTE — Therapy (Signed)
[place], [place], [address] Phone: [phone]   Fax:  [phone]  Physical Therapy Treatment  Patient Details  Name: Christine Butler MRN: [id number] Date of Birth: 09-29-1970 Referring Provider (PT): Lauralee Evener Placido Sou, NP    Encounter Date: 03/02/2020   PT End of Session - 03/02/20 1124    Visit Number 12    Number of Visits 17    Date for PT Re-Evaluation 03/19/20    PT Start Time 1103    PT Stop Time 1145    PT Time Calculation (min) 42 min    Activity Tolerance Patient tolerated treatment well    Behavior During Therapy [facility] for tasks assessed/performed           Past Medical History:  Diagnosis Date  . (HFpEF) heart failure with preserved ejection fraction ([place]) 08/09/2019  . Acute blood loss anemia 04/20/2017  . Altered mental status 08/14/2019  . Arthritis    right knee  . Ascites--mild this admit 11/06/2017   per patient , now stable with meds and lifestlye adjustment  . Atypical chest pain 11/05/2017  . Atypical squamous cells of undetermined significance (ASCUS) on Papanicolaou smear of cervix 08/05/2018  . CAD (coronary artery disease)    a. s/p CABGx2 in 02/2017 (LAD not suitable for PCI), EF normal.  . CAD (coronary artery disease), native coronary artery 03/23/2017  . 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 Cirrhosis ([place]) 03/16/2017  . Coronary artery disease 03/24/2017  . Dyslipidemia, goal LDL below 70 10/13/2017  . Elevated LFTs 04/20/2017  . Encephalopathy 09/29/2019  . Familial hyperlipidemia   . GERD (gastroesophageal reflux disease)   . GI bleed 02/01/2018  . H/O LEEP 03/30/2019  . H/O two vessel coronary artery bypass graft 03/30/2019  . High grade squamous intraepithelial lesion (HGSIL) on cytologic smear of cervix 05/04/2019  . Hypo-osmolality and hyponatremia 03/30/2019  . IBS (irritable bowel syndrome)   . Other insomnia 09/05/2019  . Pancytopenia ([place]) 09/28/2019   . PONV (postoperative nausea and vomiting)    i always throw up on waking up , but last EGD in march had no issues    . Port-A-Cath in place 11/21/2019  . Primary biliary cholangitis ([place]) 07/26/2012   Formatting of this note might be different from the original. IMOUPDATE  . Primary biliary cirrhosis (H[facility])    cirhosis/liver disease followed by transplant team led by Dr Manuella Ghazi at [facility]   . S/P CABG (coronary artery bypass graft)   . S/P CABG x 2 03/24/2017   LIMA to DIAGONAL Portion of SVG/LEFT RADIAL to LAD  . S/P LEEP (loop electrosurgical excision procedure) 05/11/2019  . Status post liver transplantation ([place]) 03/30/2019  . SVD (spontaneous vaginal delivery)    x 3  . Upper GI bleed 02/01/2018  . Urinary tract bacterial infections     Past Surgical History:  Procedure Laterality Date  . CORONARY ARTERY BYPASS GRAFT N/A 03/24/2017   Procedure: CORONARY ARTERY BYPASS GRAFTING (CABG) x two, using left internal mammary artery, left radial artery, and right leg greater saphenous vein harvested endoscopically;  Surgeon: Ivin Poot, MD;  Location: [facility];  Service: Open Heart Surgery;  Laterality: N/A;  . ENDOVEIN HARVEST OF GREATER SAPHENOUS VEIN Right 03/24/2017   Procedure: ENDOVEIN HARVEST OF GREATER SAPHENOUS VEIN;  Surgeon: Ivin Poot, MD;  Location: [facility];  Service: Open Heart Surgery;  Laterality: Right;  . ESOPHAGEAL BANDING  02/01/2018  Procedure: ESOPHAGEAL BANDING;  Surgeon: [name], MD;  Location: [facility];  Service: Gastroenterology;;  . ESOPHAGEAL BANDING N/A 03/29/2018   Procedure: ESOPHAGEAL BANDING;  Surgeon: [name], MD;  Location: [facility];  Service: Gastroenterology;  Laterality: N/A;  . ESOPHAGEAL BANDING N/A 05/21/2018   Procedure: ESOPHAGEAL BANDING;  Surgeon: [name], MD;  Location: [facility];  Service: Gastroenterology;  Laterality: N/A;  . ESOPHAGEAL BANDING N/A 10/11/2018   Procedure: ESOPHAGEAL BANDING;  Surgeon: [name], MD;   Location: [facility];  Service: Gastroenterology;  Laterality: N/A;  . ESOPHAGOGASTRODUODENOSCOPY N/A 03/29/2018   Procedure: ESOPHAGOGASTRODUODENOSCOPY (EGD);  Surgeon: [name], MD;  Location: [facility];  Service: Gastroenterology;  Laterality: N/A;  . ESOPHAGOGASTRODUODENOSCOPY N/A 05/21/2018   Procedure: ESOPHAGOGASTRODUODENOSCOPY (EGD);  Surgeon: [name], MD;  Location: [facility];  Service: Gastroenterology;  Laterality: N/A;  . ESOPHAGOGASTRODUODENOSCOPY (EGD) WITH PROPOFOL N/A 02/01/2018   Procedure: ESOPHAGOGASTRODUODENOSCOPY (EGD) WITH PROPOFOL;  Surgeon: [name], MD;  Location: [facility];  Service: Gastroenterology;  Laterality: N/A;  . ESOPHAGOGASTRODUODENOSCOPY (EGD) WITH PROPOFOL N/A 10/11/2018   Procedure: ESOPHAGOGASTRODUODENOSCOPY (EGD) WITH PROPOFOL;  Surgeon: [name], MD;  Location: [facility];  Service: Gastroenterology;  Laterality: N/A;  . INCONTINENCE SURGERY    . IR IMAGING GUIDED PORT INSERTION  04/08/2019  . LEEP N/A 03/28/2014   Procedure: LOOP ELECTROSURGICAL EXCISION PROCEDURE (LEEP) cone biopsy;  Surgeon: Cyril Mourning, MD;  Location: [facility];  Service: Gynecology;  Laterality: N/A;  . LEFT HEART CATH AND CORONARY ANGIOGRAPHY N/A 03/23/2017   Procedure: LEFT HEART CATH AND CORONARY ANGIOGRAPHY;  Surgeon: Jettie Booze, MD;  Location: [facility];  Service: Cardiovascular;  Laterality: N/A;  . LIVER BIOPSY     x 2  . RADIAL ARTERY HARVEST Left 03/24/2017   Procedure: RADIAL ARTERY HARVEST;  Surgeon: Ivin Poot, MD;  Location: [facility];  Service: Open Heart Surgery;  Laterality: Left;  . TEE WITHOUT CARDIOVERSION N/A 03/24/2017   Procedure: TRANSESOPHAGEAL ECHOCARDIOGRAM (TEE);  Surgeon: Prescott Gum, Collier [place], MD;  Location: [facility];  Service: Open Heart Surgery;  Laterality: N/A;  . WISDOM TOOTH EXTRACTION      There were no vitals filed for this visit.   Subjective Assessment - 03/02/20 1121    Subjective Patient reports she did  well after the last visit. She had no pain.    Pertinent History CAD, liver tranplants    Limitations Standing;Walking;Sitting    How long can you sit comfortably? 1 hour    How long can you stand comfortably? 15 min    How long can you walk comfortably? 15 min    Patient Stated Goals get back to walking a mile, increase stamina, promote strength and return work.    Currently in Pain? No/denies                             [facility] Adult PT Treatment/Exercise - 03/02/20 0001      Knee/Hip Exercises: Machines for Strengthening   Total Gym Leg Press 25lbs x30      Knee/Hip Exercises: Supine   Bridges 20 reps    Other Supine Knee/Hip Exercises x20 clamshells Blue    Other Supine Knee/Hip Exercises double knee to chest ball x20      Shoulder Exercises: Standing   Other Standing Exercises pallof press x20 red band/ chops x20 red band standing shoulder flexion x20 red band; sctanding scaption 2lb x20;  Shoulder Exercises: ROM/Strengthening   Cybex Row Limitations x30 20lbs                  PT Education - 03/02/20 1322    Education Details reviewed tehcnique with ther-ex    Person(s) Educated Patient    Methods Demonstration;Tactile cues;Explanation    Comprehension Returned demonstration;Verbalized understanding;Verbal cues required;Tactile cues required            PT Short Term Goals - 01/23/20 1146      PT SHORT TERM GOAL #1   Title pt to be IND with initial HEP    Time 4    Period Weeks    Status Achieved    Target Date 12/22/19      PT SHORT TERM GOAL #2   Title increase bil hip abductor / extensor strength to >/ 3-/5 to promote hip stability    Time 4    Period Weeks    Status Achieved    Target Date 12/22/19      PT SHORT TERM GOAL #3   Title increase 5 x sit to stand  and TUG to </= 13 seconds to work toward functional / safe transitions    Time 4    Period Weeks    Status Achieved    Target Date 12/22/19             PT  Long Term Goals - 01/23/20 1148      PT LONG TERM GOAL #1   Title increase bil LE gross strength to 4+/5 to promtoe hip/ knee stability and promote efficient posture    Time 8    Period Weeks    Status Partially Met      PT LONG TERM GOAL #2   Title increase bil Gross UE strength to >/= 4+/5 to assist with functional lifting/ carrying / pushing and pull as it relates to potential work related tasks.    Time 8    Period Weeks    Status Partially Met      PT LONG TERM GOAL #3   Title improve 5 x STS to </= 12 seconds and TUG to </= 10 seconds to for functiona/ efficent and safe mobility    Time 8    Period Weeks    Status Achieved      PT LONG TERM GOAL #4   Title pt to be able to stand and walk for >/= 1 hour for functional endurnace required for work tasks and community ambulation with no report of pain    Time 8    Period Weeks    Status Achieved      PT LONG TERM GOAL #5   Title increase FOTO score to </=44% limited to demo improvement in function    Baseline 57% limited at initial eval    Time 8    Period Weeks    Status Partially Met      PT LONG TERM GOAL #6   Title pt to be IND with all HEP to maintain and progress current LOF IND    Time 8    Period Weeks    Status Partially Met                 Plan - 03/02/20 1322    Clinical Impression Statement Patient continues to tolerate ther-=ex well. She was able to work on machines for strengthening today as well as working on [employer] for her shoulders. She was advised to continue ther-ex as  much as she is able.    Comorbidities hx of liver transplant, anemia, significant PMHx/ surgical hx    Stability/Clinical Decision Making Evolving/Moderate complexity    Clinical Decision Making Moderate    Rehab Potential Good    PT Duration 8 weeks    PT Treatment/Interventions ADLs/Self Care Home Management;Moist Heat;Cryotherapy;Gait training;Stair training;Functional mobility training;Therapeutic  activities;Therapeutic exercise;Balance training;Neuromuscular re-education;Manual techniques;Passive range of motion;Taping;Patient/family education    PT Next Visit Plan Hip strengthening (esp ABD and EXT), Core strengthening, UE work,    PT Home Exercise Plan [id number] - low back stretch (seated walking hands on chair), sit to stand with hands on knees, lower trunk rotations, standing hip flexor stretch, STS, Heel taps off step, wall squat, anti-rotation step outs,    Consulted and Agree with Plan of Care Patient           Patient will benefit from skilled therapeutic intervention in order to improve the following deficits and impairments:  Abnormal gait,Improper body mechanics,Postural dysfunction,Increased muscle spasms,Decreased strength,Pain,Decreased activity tolerance,Decreased balance,Decreased endurance  Visit Diagnosis: Muscle weakness (generalized)  Muscle spasm of back  Other abnormalities of gait and mobility  Abnormal posture  Unsteadiness on feet     Problem List Patient Active Problem List   Diagnosis Date Noted  . S/P CABG (coronary artery bypass graft)   . Port-A-Cath in place 11/21/2019  . Encephalopathy 09/29/2019  . Pancytopenia ([place]) 09/28/2019  . Arthritis 09/21/2019  . PONV (postoperative nausea and vomiting) 09/21/2019  . SVD (spontaneous vaginal delivery) 09/21/2019  . Urinary tract bacterial infections 09/21/2019  . CAD (coronary artery disease)   . Other insomnia 09/05/2019  . 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 Altered mental status 08/14/2019  . (HFpEF) heart failure with preserved ejection fraction ([place]) 08/09/2019  . S/P LEEP (loop electrosurgical excision procedure) 05/11/2019  . High grade squamous intraepithelial lesion (HGSIL) on cytologic smear of cervix 05/04/2019  . Familial hyperlipidemia 03/30/2019  . H/O two vessel coronary artery bypass graft 03/30/2019  . Status post liver transplantation ([place]) 03/30/2019  . Hypo-osmolality and  hyponatremia 03/30/2019  . Atypical squamous cells of undetermined significance (ASCUS) on Papanicolaou smear of cervix 08/05/2018  . Upper GI bleed 02/01/2018  . GI bleed 02/01/2018  . Ascites--mild this admit 11/06/2017  . Atypical chest pain 11/05/2017  . Dyslipidemia, goal LDL below 70 10/13/2017  . Acute blood loss anemia 04/20/2017  . Elevated LFTs 04/20/2017  . Coronary artery disease 03/24/2017  . S/P CABG x 2 03/24/2017  . CAD (coronary artery disease), native coronary artery 03/23/2017  . Cirrhosis ([place]) 03/16/2017  . GERD (gastroesophageal reflux disease) 01/28/2012  . Primary biliary cirrhosis ([place]) 01/28/2012  . IBS (irritable bowel syndrome) 01/28/2012  . Primary biliary cholangitis ([place]) 01/28/2012    Carney Living PT DPT  03/02/2020, 1:26 PM  [facility] [address] [place], [place], [address] Phone: [phone]   Fax:  [phone]  Name: Christine Butler MRN: [id number] Date of Birth: 12/01/1970

## 2020-03-05 DIAGNOSIS — Z79899 Other long term (current) drug therapy: Principal | ICD-10-CM

## 2020-03-05 DIAGNOSIS — Z944 Liver transplant status: Principal | ICD-10-CM

## 2020-03-05 MED FILL — SULFAMETHOXAZOLE-TMP SS TAB: 400-80 | 28 days supply | Qty: 12 | Fill #1

## 2020-03-06 DIAGNOSIS — Z944 Liver transplant status: Principal | ICD-10-CM

## 2020-03-06 DIAGNOSIS — Z79899 Other long term (current) drug therapy: Principal | ICD-10-CM

## 2020-03-07 ENCOUNTER — Ambulatory Visit: Payer: 59 | Admitting: Physical Therapy

## 2020-03-07 ENCOUNTER — Ambulatory Visit
Admit: 2020-03-07 | Discharge: 2020-03-08 | Payer: PRIVATE HEALTH INSURANCE | Attending: Physician Assistant | Primary: Physician Assistant

## 2020-03-07 DIAGNOSIS — Z20828 Contact with and (suspected) exposure to other viral communicable diseases: Principal | ICD-10-CM

## 2020-03-07 DIAGNOSIS — Z20822 Contact with and (suspected) exposure to covid-19: Principal | ICD-10-CM

## 2020-03-08 DIAGNOSIS — Z944 Liver transplant status: Principal | ICD-10-CM

## 2020-03-08 DIAGNOSIS — Z79899 Other long term (current) drug therapy: Principal | ICD-10-CM

## 2020-03-12 DIAGNOSIS — Z944 Liver transplant status: Principal | ICD-10-CM

## 2020-03-12 DIAGNOSIS — Z79899 Other long term (current) drug therapy: Principal | ICD-10-CM

## 2020-03-13 ENCOUNTER — Other Ambulatory Visit (HOSPITAL_COMMUNITY): Payer: Self-pay

## 2020-03-13 ENCOUNTER — Telehealth: Admit: 2020-03-13 | Discharge: 2020-03-14 | Payer: PRIVATE HEALTH INSURANCE

## 2020-03-13 DIAGNOSIS — Z944 Liver transplant status: Principal | ICD-10-CM

## 2020-03-13 DIAGNOSIS — F329 Major depressive disorder, single episode, unspecified: Principal | ICD-10-CM

## 2020-03-13 DIAGNOSIS — G4709 Other insomnia: Principal | ICD-10-CM

## 2020-03-13 DIAGNOSIS — Z79899 Other long term (current) drug therapy: Principal | ICD-10-CM

## 2020-03-13 HISTORY — DX: Major depressive disorder, single episode, unspecified: F32.9

## 2020-03-13 MED ORDER — MIRTAZAPINE 30 MG TABLET
ORAL_TABLET | Freq: Every evening | ORAL | 2 refills | 30 days | Status: CP
Start: 2020-03-13 — End: 2020-06-11

## 2020-03-13 MED FILL — MIRTAZAPINE 30 MG TABLET: 30 | 30 days supply | Qty: 30 | Fill #0

## 2020-03-14 ENCOUNTER — Other Ambulatory Visit: Payer: Self-pay

## 2020-03-14 ENCOUNTER — Ambulatory Visit: Payer: 59 | Admitting: Physical Therapy

## 2020-03-14 ENCOUNTER — Ambulatory Visit: Payer: 59

## 2020-03-14 DIAGNOSIS — Z79899 Other long term (current) drug therapy: Principal | ICD-10-CM

## 2020-03-14 DIAGNOSIS — Z944 Liver transplant status: Principal | ICD-10-CM

## 2020-03-14 DIAGNOSIS — R2681 Unsteadiness on feet: Secondary | ICD-10-CM | POA: Diagnosis not present

## 2020-03-14 DIAGNOSIS — R2689 Other abnormalities of gait and mobility: Secondary | ICD-10-CM | POA: Diagnosis not present

## 2020-03-14 DIAGNOSIS — M62830 Muscle spasm of back: Secondary | ICD-10-CM | POA: Diagnosis not present

## 2020-03-14 DIAGNOSIS — M6281 Muscle weakness (generalized): Secondary | ICD-10-CM | POA: Diagnosis not present

## 2020-03-14 DIAGNOSIS — R293 Abnormal posture: Secondary | ICD-10-CM | POA: Diagnosis not present

## 2020-03-14 NOTE — Therapy (Signed)
[place], [place], [address] Phone: [phone]   Fax:  [phone]  Physical Therapy Treatment  Patient Details  Name: Christine Butler MRN: [id number] Date of Birth: Mar 24, 1970 Referring Provider (PT): Lauralee Evener Placido Sou, NP    Encounter Date: 03/14/2020   PT End of Session - 03/14/20 1215    Visit Number 13    Number of Visits 17    Date for PT Re-Evaluation 03/19/20    Authorization Type [facility] UMR    PT Start Time 1215    PT Stop Time 1253    PT Time Calculation (min) 38 min    Activity Tolerance Patient tolerated treatment well    Behavior During Therapy [facility] for tasks assessed/performed           Past Medical History:  Diagnosis Date  . (HFpEF) heart failure with preserved ejection fraction ([place]) 08/09/2019  . Acute blood loss anemia 04/20/2017  . Altered mental status 08/14/2019  . Arthritis    right knee  . Ascites--mild this admit 11/06/2017   per patient , now stable with meds and lifestlye adjustment  . Atypical chest pain 11/05/2017  . Atypical squamous cells of undetermined significance (ASCUS) on Papanicolaou smear of cervix 08/05/2018  . CAD (coronary artery disease)    a. s/p CABGx2 in 02/2017 (LAD not suitable for PCI), EF normal.  . CAD (coronary artery disease), native coronary artery 03/23/2017  . 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 Cirrhosis ([place]) 03/16/2017  . Coronary artery disease 03/24/2017  . Dyslipidemia, goal LDL below 70 10/13/2017  . Elevated LFTs 04/20/2017  . Encephalopathy 09/29/2019  . Familial hyperlipidemia   . GERD (gastroesophageal reflux disease)   . GI bleed 02/01/2018  . H/O LEEP 03/30/2019  . H/O two vessel coronary artery bypass graft 03/30/2019  . High grade squamous intraepithelial lesion (HGSIL) on cytologic smear of cervix 05/04/2019  . Hypo-osmolality and hyponatremia 03/30/2019  . IBS (irritable bowel syndrome)   . Other insomnia 09/05/2019   . Pancytopenia ([place]) 09/28/2019  . PONV (postoperative nausea and vomiting)    i always throw up on waking up , but last EGD in march had no issues    . Port-A-Cath in place 11/21/2019  . Primary biliary cholangitis ([place]) 07/26/2012   Formatting of this note might be different from the original. IMOUPDATE  . Primary biliary cirrhosis (HCC)    cirhosis/liver disease followed by transplant team led by Dr Manuella Ghazi at [facility]   . S/P CABG (coronary artery bypass graft)   . S/P CABG x 2 03/24/2017   LIMA to DIAGONAL Portion of SVG/LEFT RADIAL to LAD  . S/P LEEP (loop electrosurgical excision procedure) 05/11/2019  . Status post liver transplantation ([place]) 03/30/2019  . SVD (spontaneous vaginal delivery)    x 3  . Upper GI bleed 02/01/2018  . Urinary tract bacterial infections     Past Surgical History:  Procedure Laterality Date  . CORONARY ARTERY BYPASS GRAFT N/A 03/24/2017   Procedure: CORONARY ARTERY BYPASS GRAFTING (CABG) x two, using left internal mammary artery, left radial artery, and right leg greater saphenous vein harvested endoscopically;  Surgeon: Ivin Poot, MD;  Location: [facility];  Service: Open Heart Surgery;  Laterality: N/A;  . ENDOVEIN HARVEST OF GREATER SAPHENOUS VEIN Right 03/24/2017   Procedure: ENDOVEIN HARVEST OF GREATER SAPHENOUS VEIN;  Surgeon: Ivin Poot, MD;  Location: [facility];  Service: Open Heart Surgery;  Laterality: Right;  .  ESOPHAGEAL BANDING  02/01/2018   Procedure: ESOPHAGEAL BANDING;  Surgeon: [name], MD;  Location: [facility];  Service: Gastroenterology;;  . ESOPHAGEAL BANDING N/A 03/29/2018   Procedure: ESOPHAGEAL BANDING;  Surgeon: [name], MD;  Location: [facility];  Service: Gastroenterology;  Laterality: N/A;  . ESOPHAGEAL BANDING N/A 05/21/2018   Procedure: ESOPHAGEAL BANDING;  Surgeon: [name], MD;  Location: [facility];  Service: Gastroenterology;  Laterality: N/A;  . ESOPHAGEAL BANDING N/A 10/11/2018   Procedure: ESOPHAGEAL BANDING;   Surgeon: [name], MD;  Location: [facility];  Service: Gastroenterology;  Laterality: N/A;  . ESOPHAGOGASTRODUODENOSCOPY N/A 03/29/2018   Procedure: ESOPHAGOGASTRODUODENOSCOPY (EGD);  Surgeon: [name], MD;  Location: [facility];  Service: Gastroenterology;  Laterality: N/A;  . ESOPHAGOGASTRODUODENOSCOPY N/A 05/21/2018   Procedure: ESOPHAGOGASTRODUODENOSCOPY (EGD);  Surgeon: [name], MD;  Location: [facility];  Service: Gastroenterology;  Laterality: N/A;  . ESOPHAGOGASTRODUODENOSCOPY (EGD) WITH PROPOFOL N/A 02/01/2018   Procedure: ESOPHAGOGASTRODUODENOSCOPY (EGD) WITH PROPOFOL;  Surgeon: [name], MD;  [facility]: [facility];  Service: Gastroenterology;  Laterality: N/A;  . ESOPHAGOGASTRODUODENOSCOPY (EGD) WITH PROPOFOL N/A 10/11/2018   Procedure: ESOPHAGOGASTRODUODENOSCOPY (EGD) WITH PROPOFOL;  Surgeon: [name], MD;  Location: [facility];  Service: Gastroenterology;  Laterality: N/A;  . INCONTINENCE SURGERY    . IR IMAGING GUIDED PORT INSERTION  04/08/2019  . LEEP N/A 03/28/2014   Procedure: LOOP ELECTROSURGICAL EXCISION PROCEDURE (LEEP) cone biopsy;  Surgeon: Cyril Mourning, MD;  Location: [facility];  Service: Gynecology;  Laterality: N/A;  . LEFT HEART CATH AND CORONARY ANGIOGRAPHY N/A 03/23/2017   Procedure: LEFT HEART CATH AND CORONARY ANGIOGRAPHY;  Surgeon: Jettie Booze, MD;  Location: [facility];  Service: Cardiovascular;  Laterality: N/A;  . LIVER BIOPSY     x 2  . RADIAL ARTERY HARVEST Left 03/24/2017   Procedure: RADIAL ARTERY HARVEST;  Surgeon: Ivin Poot, MD;  Location: [facility];  Service: Open Heart Surgery;  Laterality: Left;  . TEE WITHOUT CARDIOVERSION N/A 03/24/2017   Procedure: TRANSESOPHAGEAL ECHOCARDIOGRAM (TEE);  Surgeon: Prescott Gum, Collier Salina, MD;  Location: [facility];  Service: Open Heart Surgery;  Laterality: N/A;  . WISDOM TOOTH EXTRACTION      There were no vitals filed for this visit.   Subjective Assessment - 03/14/20 1220     Subjective Patient reports feeling well since her last visit. She states she had several tests done to identify cause of her random fevers but nothing specific has been detected yet. She explains she has back pain that lasts for 15-20 seconds with random onset. She plans to speak to her doctor about the LBP because others in her support group have experienced similar symptoms following liver transplant.    Pertinent History CAD, liver tranplants    Limitations Standing;Walking;Sitting    How long can you sit comfortably? 1 hour    How long can you stand comfortably? 15 min    How long can you walk comfortably? 15 min    Patient Stated Goals get back to walking a mile, increase stamina, promote strength and return work.    Currently in Pain? No/denies    Pain Onset More than a month ago              [facility] PT Assessment - 03/14/20 0001      Assessment   Medical Diagnosis Other malaise R53.81, Liver transplant status Z94.4    Referring Provider (PT) Martin-Velez, Placido Sou, NP     Onset Date/Surgical Date --   liver  transplant 10/18/2019                        [facility] Adult PT Treatment/Exercise - 03/14/20 0001      Knee/Hip Exercises: Aerobic   Nustep L6 x 5 min UE/LE      Knee/Hip Exercises: Standing   Other Standing Knee Exercises Hip hinge with dowel x 15    Other Standing Knee Exercises Deadlift with 10# kettlebell placed on 4" step. Cues for body mechanics and technique      Knee/Hip Exercises: Supine   Bridges with Diona Foley Squeeze Strengthening;Both;20 reps    Other Supine Knee/Hip Exercises clamshells x 20 with blue theraband    Other Supine Knee/Hip Exercises DKTC x 15 with green swiss ball      Shoulder Exercises: Standing   Flexion Limitations Scaption 2 x 15 with 3# dumbbells in BUE    Row Strengthening;Both;15 reps   2 x 15   Theraband Level (Shoulder Row) Other (comment)   Black   Other Standing Exercises pallof press with rotation using 2 red bands x 10  each direction.                  PT Education - 03/14/20 1311    Education Details Reviewed body mechanics and technique with interventions    Person(s) Educated Patient    Methods Explanation;Demonstration;Tactile cues;Verbal cues    Comprehension Verbalized understanding;Returned demonstration;Verbal cues required;Tactile cues required            PT Short Term Goals - 01/23/20 1146      PT SHORT TERM GOAL #1   Title pt to be IND with initial HEP    Time 4    Period Weeks    Status Achieved    Target Date 12/22/19      PT SHORT TERM GOAL #2   Title increase bil hip abductor / extensor strength to >/ 3-/5 to promote hip stability    Time 4    Period Weeks    Status Achieved    Target Date 12/22/19      PT SHORT TERM GOAL #3   Title increase 5 x sit to stand  and TUG to </= 13 seconds to work toward functional / safe transitions    Time 4    Period Weeks    Status Achieved    Target Date 12/22/19             PT Long Term Goals - 01/23/20 1148      PT LONG TERM GOAL #1   Title increase bil LE gross strength to 4+/5 to promtoe hip/ knee stability and promote efficient posture    Time 8    Period Weeks    Status Partially Met      PT LONG TERM GOAL #2   Title increase bil Gross UE strength to >/= 4+/5 to assist with functional lifting/ carrying / pushing and pull as it relates to potential work related tasks.    Time 8    Period Weeks    Status Partially Met      PT LONG TERM GOAL #3   Title improve 5 x STS to </= 12 seconds and TUG to </= 10 seconds to for functiona/ efficent and safe mobility    Time 8    Period Weeks    Status Achieved      PT LONG TERM GOAL #4   Title pt to be able to stand and  walk for >/= 1 hour for functional endurnace required for work tasks and community ambulation with no report of pain    Time 8    Period Weeks    Status Achieved      PT LONG TERM GOAL #5   Title increase FOTO score to </=44% limited to demo  improvement in function    Baseline 57% limited at initial eval    Time 8    Period Weeks    Status Partially Met      PT LONG TERM GOAL #6   Title pt to be IND with all HEP to maintain and progress current LOF IND    Time 8    Period Weeks    Status Partially Met                 Plan - 03/14/20 1312    Clinical Impression Statement Patient tolerated treatment session well with no adverse effects or complaints of increased pain. Pt does not report TTP along lumbar vertebrae or low back musculature but is able to identify where she has her intermittent LBP upon palpation. Added hip hinge and deadlift to emphasize body mechanics as patient prepares to return to work 04/23/2020.    Comorbidities hx of liver transplant, anemia, significant PMHx/ surgical hx    Stability/Clinical Decision Making Evolving/Moderate complexity    Rehab Potential Good    PT Duration 8 weeks    PT Treatment/Interventions ADLs/Self Care Home Management;Moist Heat;Cryotherapy;Gait training;Stair training;Functional mobility training;Therapeutic activities;Therapeutic exercise;Balance training;Neuromuscular re-education;Manual techniques;Passive range of motion;Taping;Patient/family education    PT Next Visit Plan Re-evaluation. Hip strengthening (esp ABD and EXT), Core strengthening, UE work. Body mechanics for work related tasks.    PT Home Exercise Plan [phone] - low back stretch (seated walking hands on chair), sit to stand with hands on knees, lower trunk rotations, standing hip flexor stretch, STS, Heel taps off step, wall squat, anti-rotation step outs,    Consulted and Agree with Plan of Care Patient           Patient will benefit from skilled therapeutic intervention in order to improve the following deficits and impairments:  Abnormal gait,Improper body mechanics,Postural dysfunction,Increased muscle spasms,Decreased strength,Pain,Decreased activity tolerance,Decreased balance,Decreased  endurance  Visit Diagnosis: Muscle weakness (generalized)  Muscle spasm of back  Other abnormalities of gait and mobility  Abnormal posture  Unsteadiness on feet     Problem List Patient Active Problem List   Diagnosis Date Noted  . S/P CABG (coronary artery bypass graft)   . Port-A-Cath in place 11/21/2019  . Encephalopathy 09/29/2019  . Pancytopenia ([place]) 09/28/2019  . Arthritis 09/21/2019  . PONV (postoperative nausea and vomiting) 09/21/2019  . SVD (spontaneous vaginal delivery) 09/21/2019  . Urinary tract bacterial infections 09/21/2019  . CAD (coronary artery disease)   . Other insomnia 09/05/2019  . 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 Altered mental status 08/14/2019  . (HFpEF) heart failure with preserved ejection fraction ([place]) 08/09/2019  . S/P LEEP (loop electrosurgical excision procedure) 05/11/2019  . High grade squamous intraepithelial lesion (HGSIL) on cytologic smear of cervix 05/04/2019  . Familial hyperlipidemia 03/30/2019  . H/O two vessel coronary artery bypass graft 03/30/2019  . Status post liver transplantation ([place]) 03/30/2019  . Hypo-osmolality and hyponatremia 03/30/2019  . Atypical squamous cells of undetermined significance (ASCUS) on Papanicolaou smear of cervix 08/05/2018  . Upper GI bleed 02/01/2018  . GI bleed 02/01/2018  . Ascites--mild this admit 11/06/2017  . Atypical chest pain 11/05/2017  . Dyslipidemia, goal  LDL below 70 10/13/2017  . Acute blood loss anemia 04/20/2017  . Elevated LFTs 04/20/2017  . Coronary artery disease 03/24/2017  . S/P CABG x 2 03/24/2017  . CAD (coronary artery disease), native coronary artery 03/23/2017  . Cirrhosis ([place]) 03/16/2017  . GERD (gastroesophageal reflux disease) 01/28/2012  . Primary biliary cirrhosis ([place]) 01/28/2012  . IBS (irritable bowel syndrome) 01/28/2012  . Primary biliary cholangitis ([place]) 01/28/2012     Haydee Monica, PT, DPT 03/14/20 1:27 PM  [place]  [facility] [address] [place], [place], [address] Phone: [phone]   Fax:  [phone]  Name: Christine Butler MRN: [id number] Date of Birth: Jun 02, 1970

## 2020-03-15 DIAGNOSIS — Z79899 Other long term (current) drug therapy: Principal | ICD-10-CM

## 2020-03-15 DIAGNOSIS — Z944 Liver transplant status: Principal | ICD-10-CM

## 2020-03-16 ENCOUNTER — Ambulatory Visit: Payer: 59 | Admitting: Physical Therapy

## 2020-03-17 MED FILL — predniSONE 5 MG TABS: 5 | 30 days supply | Qty: 30 | Fill #2

## 2020-03-19 ENCOUNTER — Other Ambulatory Visit (HOSPITAL_COMMUNITY)
Admission: RE | Admit: 2020-03-19 | Discharge: 2020-03-19 | Disposition: A | Discharge status: Home / Self Care | Payer: 59 | Source: Ambulatory Visit | Attending: Obstetrics and Gynecology | Admitting: Obstetrics and Gynecology

## 2020-03-19 DIAGNOSIS — Z79899 Other long term (current) drug therapy: Principal | ICD-10-CM

## 2020-03-19 DIAGNOSIS — Z944 Liver transplant status: Principal | ICD-10-CM

## 2020-03-19 DIAGNOSIS — Z01812 Encounter for preprocedural laboratory examination: Secondary | ICD-10-CM | POA: Insufficient documentation

## 2020-03-19 DIAGNOSIS — Z20822 Contact with and (suspected) exposure to covid-19: Secondary | ICD-10-CM | POA: Insufficient documentation

## 2020-03-19 LAB — SARS CORONAVIRUS 2 (TAT 6-24 HRS): SARS Coronavirus 2: NEGATIVE

## 2020-03-20 ENCOUNTER — Other Ambulatory Visit: Payer: Self-pay

## 2020-03-20 ENCOUNTER — Ambulatory Visit: Payer: 59 | Admitting: Physical Therapy

## 2020-03-20 ENCOUNTER — Encounter (HOSPITAL_BASED_OUTPATIENT_CLINIC_OR_DEPARTMENT_OTHER): Payer: Self-pay | Admitting: Obstetrics and Gynecology

## 2020-03-20 ENCOUNTER — Telehealth: Admit: 2020-03-20 | Discharge: 2020-03-21 | Payer: PRIVATE HEALTH INSURANCE | Attending: Clinical | Primary: Clinical

## 2020-03-20 DIAGNOSIS — Z944 Liver transplant status: Principal | ICD-10-CM

## 2020-03-20 DIAGNOSIS — F329 Major depressive disorder, single episode, unspecified: Principal | ICD-10-CM

## 2020-03-20 DIAGNOSIS — Z79899 Other long term (current) drug therapy: Principal | ICD-10-CM

## 2020-03-20 NOTE — Progress Notes (Addendum)
Spoke w/ via phone for pre-op interview---pt Lab needs dos---- I stat T & S               COVID test ------03-19-2020 1140 Arrive at -------530 am 03-16-2020 NPO after MN NO Solid Food.  Clear liquids from MN until---430 am then npo Medications to take morning of surgery -----envarsus, myfortic, prednisone, cardediliol, omeprazole Diabetic medication ----- Patient Special Instructions ----- Pre-Op special Istructions ----- Patient verbalized understanding of instructions that were given at this phone interview. Patient denies shortness of breath, chest pain, fever, cough at this phone interview.  Anesthesia : liver transplant 10-18-2019 (primary biliary cholangiocitis), cabg x 2 [phone] (cad) complicated medical history, has clearance from transplant dr Ulis Rias 03-16-2020 care everywhere, patient states has not used nitro in 1 or 2 years and denies any cardiac S & S at phone interview  Chart to [place] zanetto pa for review Ok to proceed per [name] zanetto pa  PCP: dorothy scifres np [place] Cardiologist : dr Geraldo Pitter cardiology lov 09-22-2019 epic, dr hilty sees for lipidemia 01-11-2020 epic Chest x-ray : 09-19-2019 epic EKG : 09-19-2019 epic Echo : 09-30-1017 epic Stress test: 08-17-2018 epic Cardiac Cath :  03-16-2017 epic Activity level: able to do own housework and climbs stairs without problems Sleep Study/ CPAP :none Fasting Blood Sugar :      / Checks Blood Sugar -- times a day:  n/a Blood Thinner/ Instructions /Last Dose:n/a ASA / Instructions/ Last Dose : n/a

## 2020-03-22 ENCOUNTER — Other Ambulatory Visit: Payer: Self-pay

## 2020-03-22 ENCOUNTER — Ambulatory Visit (HOSPITAL_BASED_OUTPATIENT_CLINIC_OR_DEPARTMENT_OTHER)
Admission: RE | Admit: 2020-03-22 | Discharge: 2020-03-22 | Disposition: A | Discharge status: Home / Self Care | Payer: 59 | Attending: Obstetrics and Gynecology | Admitting: Obstetrics and Gynecology

## 2020-03-22 ENCOUNTER — Ambulatory Visit (HOSPITAL_BASED_OUTPATIENT_CLINIC_OR_DEPARTMENT_OTHER): Payer: 59 | Admitting: Physician Assistant

## 2020-03-22 ENCOUNTER — Encounter (HOSPITAL_BASED_OUTPATIENT_CLINIC_OR_DEPARTMENT_OTHER): Payer: Self-pay | Admitting: Obstetrics and Gynecology

## 2020-03-22 ENCOUNTER — Encounter (HOSPITAL_BASED_OUTPATIENT_CLINIC_OR_DEPARTMENT_OTHER)
Admission: RE | Disposition: A | Discharge status: Home / Self Care | Payer: Self-pay | Source: Home / Self Care | Attending: Obstetrics and Gynecology

## 2020-03-22 DIAGNOSIS — Z79899 Other long term (current) drug therapy: Principal | ICD-10-CM

## 2020-03-22 DIAGNOSIS — Z944 Liver transplant status: Principal | ICD-10-CM

## 2020-03-22 DIAGNOSIS — I2510 Atherosclerotic heart disease of native coronary artery without angina pectoris: Secondary | ICD-10-CM | POA: Diagnosis not present

## 2020-03-22 DIAGNOSIS — N870 Mild cervical dysplasia: Secondary | ICD-10-CM | POA: Insufficient documentation

## 2020-03-22 DIAGNOSIS — I10 Essential (primary) hypertension: Secondary | ICD-10-CM | POA: Diagnosis not present

## 2020-03-22 DIAGNOSIS — I110 Hypertensive heart disease with heart failure: Secondary | ICD-10-CM | POA: Diagnosis not present

## 2020-03-22 DIAGNOSIS — I5030 Unspecified diastolic (congestive) heart failure: Secondary | ICD-10-CM | POA: Diagnosis not present

## 2020-03-22 DIAGNOSIS — K219 Gastro-esophageal reflux disease without esophagitis: Secondary | ICD-10-CM | POA: Diagnosis not present

## 2020-03-22 DIAGNOSIS — Z951 Presence of aortocoronary bypass graft: Secondary | ICD-10-CM | POA: Diagnosis not present

## 2020-03-22 DIAGNOSIS — N879 Dysplasia of cervix uteri, unspecified: Secondary | ICD-10-CM | POA: Diagnosis not present

## 2020-03-22 HISTORY — DX: Other chronic pain: G89.29

## 2020-03-22 HISTORY — PX: COLPOSCOPY: SHX161

## 2020-03-22 HISTORY — PX: CERVICAL CONIZATION W/BX: SHX1330

## 2020-03-22 HISTORY — DX: Presence of spectacles and contact lenses: Z97.3

## 2020-03-22 HISTORY — DX: Essential (primary) hypertension: I10

## 2020-03-22 HISTORY — DX: Unspecified urinary incontinence: R32

## 2020-03-22 HISTORY — PX: LEEP: SHX91

## 2020-03-22 HISTORY — DX: Low back pain, unspecified: M54.50

## 2020-03-22 LAB — POCT I-STAT, CHEM 8
BUN: 34 mg/dL — ABNORMAL HIGH (ref 6–20)
Calcium, Ion: 1.09 mmol/L — ABNORMAL LOW (ref 1.15–1.40)
Chloride: 111 mmol/L (ref 98–111)
Creatinine, Ser: 1 mg/dL (ref 0.44–1.00)
Glucose, Bld: 92 mg/dL (ref 70–99)
HCT: 31 % — ABNORMAL LOW (ref 36.0–46.0)
Hemoglobin: 10.5 g/dL — ABNORMAL LOW (ref 12.0–15.0)
Potassium: 5.4 mmol/L — ABNORMAL HIGH (ref 3.5–5.1)
Sodium: 139 mmol/L (ref 135–145)
TCO2: 20 mmol/L — ABNORMAL LOW (ref 22–32)

## 2020-03-22 LAB — TYPE AND SCREEN
ABO/RH(D): A POS
Antibody Screen: NEGATIVE

## 2020-03-22 SURGERY — COLPOSCOPY
Anesthesia: General | Site: Vagina

## 2020-03-22 MED ORDER — ACETIC ACID 4% SOLUTION
Status: DC | PRN
  Administered 2020-03-22: 1 via TOPICAL

## 2020-03-22 MED ORDER — FENTANYL CITRATE (PF) 100 MCG/2ML IJ SOLN: 25.0000 ug | INTRAMUSCULAR | Status: DC | PRN

## 2020-03-22 MED ORDER — HYDROMORPHONE HCL 1 MG/ML IJ SOLN
1.0000 mg | INTRAMUSCULAR | Status: DC | PRN
  Administered 2020-03-22: 0.5 mg via INTRAVENOUS

## 2020-03-22 MED ORDER — SODIUM CHLORIDE 0.9 % IV SOLN
2.0000 g | INTRAVENOUS | Status: AC
  Administered 2020-03-22: 2 g via INTRAVENOUS

## 2020-03-22 MED ORDER — IODINE STRONG (LUGOLS) 5 % PO SOLN
ORAL | Status: DC | PRN
  Administered 2020-03-22: 0.2 mL via ORAL

## 2020-03-22 MED ORDER — OXYCODONE HCL 5 MG PO TABS
5.0000 mg | ORAL_TABLET | Freq: Once | ORAL | Status: DC | PRN
Start: 2020-03-22 — End: 2020-03-22

## 2020-03-22 MED ORDER — ACETAMINOPHEN 160 MG/5ML PO SOLN: 325.0000 mg | ORAL | Status: DC | PRN

## 2020-03-22 MED ORDER — MIDAZOLAM HCL 2 MG/2ML IJ SOLN
INTRAMUSCULAR | Status: AC
  Filled 2020-03-22: qty 2

## 2020-03-22 MED ORDER — SODIUM CHLORIDE 0.9 % IV SOLN
INTRAVENOUS | Status: AC
  Filled 2020-03-22: qty 2

## 2020-03-22 MED ORDER — DEXMEDETOMIDINE (PRECEDEX) IN NS 20 MCG/5ML (4 MCG/ML) IV SYRINGE
PREFILLED_SYRINGE | INTRAVENOUS | Status: AC
  Filled 2020-03-22: qty 5

## 2020-03-22 MED ORDER — MIDAZOLAM HCL 5 MG/5ML IJ SOLN
INTRAMUSCULAR | Status: DC | PRN
  Administered 2020-03-22: 1 mg via INTRAVENOUS

## 2020-03-22 MED ORDER — LIDOCAINE 2% (20 MG/ML) 5 ML SYRINGE
INTRAMUSCULAR | Status: DC | PRN
  Administered 2020-03-22: 100 mg via INTRAVENOUS

## 2020-03-22 MED ORDER — DEXMEDETOMIDINE (PRECEDEX) IN NS 20 MCG/5ML (4 MCG/ML) IV SYRINGE
PREFILLED_SYRINGE | INTRAVENOUS | Status: DC | PRN
  Administered 2020-03-22: 4 ug via INTRAVENOUS
  Administered 2020-03-22 (×2): 8 ug via INTRAVENOUS

## 2020-03-22 MED ORDER — HYDROMORPHONE HCL 1 MG/ML IJ SOLN
INTRAMUSCULAR | Status: AC
  Filled 2020-03-22: qty 1

## 2020-03-22 MED ORDER — PROPOFOL 10 MG/ML IV BOLUS
INTRAVENOUS | Status: AC
  Filled 2020-03-22: qty 20

## 2020-03-22 MED ORDER — LIDOCAINE HCL (PF) 2 % IJ SOLN
INTRAMUSCULAR | Status: AC
  Filled 2020-03-22: qty 5

## 2020-03-22 MED ORDER — KETOROLAC TROMETHAMINE 15 MG/ML IJ SOLN: 15.0000 mg | Freq: Once | INTRAMUSCULAR | Status: DC

## 2020-03-22 MED ORDER — ONDANSETRON HCL 4 MG/2ML IJ SOLN
INTRAMUSCULAR | Status: AC
  Filled 2020-03-22: qty 2

## 2020-03-22 MED ORDER — ACETAMINOPHEN 325 MG PO TABS: 325.0000 mg | ORAL_TABLET | ORAL | Status: DC | PRN

## 2020-03-22 MED ORDER — MEPERIDINE HCL 25 MG/ML IJ SOLN: 6.2500 mg | INTRAMUSCULAR | Status: DC | PRN

## 2020-03-22 MED ORDER — FERRIC SUBSULFATE SOLN
Status: DC | PRN
  Administered 2020-03-22: 1 via TOPICAL

## 2020-03-22 MED ORDER — LACTATED RINGERS IV SOLN: INTRAVENOUS | Status: DC

## 2020-03-22 MED ORDER — LACTATED RINGERS IV SOLN
INTRAVENOUS | Status: DC
  Administered 2020-03-22: 1000 mL via INTRAVENOUS

## 2020-03-22 MED ORDER — LIDOCAINE HCL 1 % IJ SOLN
INTRAMUSCULAR | Status: DC | PRN
  Administered 2020-03-22: 10 mL

## 2020-03-22 MED ORDER — ONDANSETRON HCL 4 MG/2ML IJ SOLN
INTRAMUSCULAR | Status: DC | PRN
  Administered 2020-03-22: 4 mg via INTRAVENOUS

## 2020-03-22 MED ORDER — POVIDONE-IODINE 10 % EX SWAB: 2.0000 "application " | Freq: Once | CUTANEOUS | Status: DC

## 2020-03-22 MED ORDER — OXYCODONE HCL 5 MG/5ML PO SOLN
5.0000 mg | Freq: Once | ORAL | Status: DC | PRN
Start: 2020-03-22 — End: 2020-03-22

## 2020-03-22 MED ORDER — HYDROMORPHONE HCL 1 MG/ML IJ SOLN: 1.0000 mg | INTRAMUSCULAR | Status: AC

## 2020-03-22 MED ORDER — PROPOFOL 10 MG/ML IV BOLUS
INTRAVENOUS | Status: DC | PRN
  Administered 2020-03-22: 120 mg via INTRAVENOUS

## 2020-03-22 MED ORDER — ONDANSETRON HCL 4 MG/2ML IJ SOLN: 4.0000 mg | Freq: Once | INTRAMUSCULAR | Status: DC | PRN

## 2020-03-22 SURGICAL SUPPLY — 42 items
APL SWBSTK 6 STRL LF DISP (MISCELLANEOUS) ×1
APPLICATOR COTTON TIP 6 STRL (MISCELLANEOUS) ×1 IMPLANT
APPLICATOR COTTON TIP 6IN STRL (MISCELLANEOUS) ×2
BLADE SURG 11 STRL SS (BLADE) ×2 IMPLANT
CANISTER SUCT 3000ML PPV (MISCELLANEOUS) IMPLANT
CATH ROBINSON RED A/P 16FR (CATHETERS) ×2 IMPLANT
CNTNR URN SCR LID CUP LEK RST (MISCELLANEOUS) ×1 IMPLANT
CONT SPEC 4OZ STRL OR WHT (MISCELLANEOUS) ×2
COVER LIGHT HANDLE STERIS (MISCELLANEOUS) ×2 IMPLANT
DECANTER SPIKE VIAL GLASS SM (MISCELLANEOUS) ×2 IMPLANT
DRAPE HYSTEROSCOPY (MISCELLANEOUS) ×2 IMPLANT
ELECT BALL LEEP 3MM BLK (ELECTRODE) IMPLANT
ELECT BALL LEEP 5MM RED (ELECTRODE) ×2 IMPLANT
ELECT LOOP LEEP RND 15X12 GRN (CUTTING LOOP) ×2
ELECT LOOP LEEP RND 20X12 WHT (CUTTING LOOP)
ELECT REM PT RETURN 9FT ADLT (ELECTROSURGICAL) ×2
ELECTRODE LOOP LP RND 15X12GRN (CUTTING LOOP) ×1 IMPLANT
ELECTRODE LOOP LP RND 20X12WHT (CUTTING LOOP) IMPLANT
ELECTRODE REM PT RTRN 9FT ADLT (ELECTROSURGICAL) ×1 IMPLANT
EXTENDER ELECT LOOP LEEP 10CM (CUTTING LOOP) IMPLANT
GLOVE ECLIPSE 6.5 STRL STRAW (GLOVE) ×2 IMPLANT
GLOVE SURG ENC MOIS LTX SZ6.5 (GLOVE) ×4 IMPLANT
GLOVE SURG SS PI 7.0 STRL IVOR (GLOVE) ×2 IMPLANT
GOWN STRL REUS W/ TWL LRG LVL3 (GOWN DISPOSABLE) ×2 IMPLANT
GOWN STRL REUS W/TWL LRG LVL3 (GOWN DISPOSABLE) ×4 IMPLANT
HIBICLENS CHG 4% 4OZ BTL (MISCELLANEOUS) ×2 IMPLANT
MANIFOLD NEPTUNE II (INSTRUMENTS) ×2 IMPLANT
NS IRRIG 1000ML POUR BTL (IV SOLUTION) IMPLANT
NS IRRIG 500ML POUR BTL (IV SOLUTION) ×2 IMPLANT
PACK VAGINAL WOMENS (CUSTOM PROCEDURE TRAY) ×2 IMPLANT
PAD OB MATERNITY 4.3X12.25 (PERSONAL CARE ITEMS) ×2 IMPLANT
PENCIL BUTTON HOLSTER BLD 10FT (ELECTRODE) ×2 IMPLANT
PENCIL SMOKE EVAC W/HOLSTER (ELECTROSURGICAL) IMPLANT
SCOPETTES 8  STERILE (MISCELLANEOUS) ×2
SCOPETTES 8 STERILE (MISCELLANEOUS) ×1 IMPLANT
SPONGE SURGIFOAM ABS GEL 12-7 (HEMOSTASIS) IMPLANT
SUT VIC AB 0 CT1 27 (SUTURE) ×4
SUT VIC AB 0 CT1 27XBRD ANBCTR (SUTURE) ×2 IMPLANT
SUT VIC AB 2-0 CT1 (SUTURE) IMPLANT
TOWEL OR 17X24 6PK STRL BLUE (TOWEL DISPOSABLE) ×2 IMPLANT
TUBE CONNECTING 12X1/4 (SUCTIONS) ×4 IMPLANT
YANKAUER SUCT BULB TIP NO VENT (SUCTIONS) ×2 IMPLANT

## 2020-03-22 NOTE — Transfer of Care (Signed)
Immediate Anesthesia Transfer of Care Note  Patient: Christine Butler  Procedure(s) Performed: COLPOSCOPY (N/A Vagina ) LOOP ELECTROSURGICAL EXCISION PROCEDURE (LEEP) (N/A Vagina ) CONIZATION CERVIX WITH BIOPSY (N/A Vagina )  Patient Location: PACU  Anesthesia Type:General  Level of Consciousness: drowsy  Airway & Oxygen Therapy: Patient Spontanous Breathing and Patient connected to nasal cannula oxygen  Post-op Assessment: Report given to RN  Post vital signs: Reviewed and stable  Last Vitals: 155/69 Vitals Value Taken Time  BP    Temp    Pulse 74 03/22/20 0812  Resp 17 03/22/20 0812  SpO2 100 % 03/22/20 0812  Vitals shown include unvalidated device data.  Last Pain:  Vitals:   03/22/20 0603  TempSrc:   PainSc: 0-No pain      Patients Stated Pain Goal: 4 (42/87/68 1157)  Complications: No complications documented.

## 2020-03-22 NOTE — Brief Op Note (Signed)
03/22/2020  8:03 AM  PATIENT:  Christine Butler  50 y.o. female  PRE-OPERATIVE DIAGNOSIS:  Cervical dysplasia  POST-OPERATIVE DIAGNOSIS:   Same  PROCEDURE:  Procedure(s): COLPOSCOPY (N/A) LOOP ELECTROSURGICAL EXCISION PROCEDURE (LEEP) (N/A) CONIZATION CERVIX WITH BIOPSY (N/A)  SURGEON:  Surgeon(s) and Role:    * Dian Queen, MD - Primary  PHYSICIAN ASSISTANT:   ASSISTANTS: none   ANESTHESIA:   paracervical block and MAC  EBL: minimal  BLOOD ADMINISTERED:none  DRAINS: none   LOCAL MEDICATIONS USED:  LIDOCAINE   SPECIMEN:  1 - ectocervix 2 - endocervix #1  3 - Endocervix # 2  DISPOSITION OF SPECIMEN:  PATHOLOGY  COUNTS:  YES  TOURNIQUET:  * No tourniquets in log *  DICTATION: .Other Dictation: Dictation Number dictated  PLAN OF CARE: Discharge to home after PACU  PATIENT DISPOSITION:  PACU - hemodynamically stable.   Delay start of Pharmacological VTE agent (>24hrs) due to surgical blood loss or risk of bleeding: yes

## 2020-03-22 NOTE — Anesthesia Preprocedure Evaluation (Addendum)
Anesthesia Evaluation  Patient identified by MRN, date of birth, ID band Patient awake    Reviewed: Allergy & Precautions, NPO status , Patient's Chart, lab work & pertinent test results, reviewed documented beta blocker date and time   History of Anesthesia Complications (+) PONV and history of anesthetic complications  Airway Mallampati: II  TM Distance: >3 FB Neck ROM: Full    Dental  (+) Dental Advisory Given   Pulmonary  10/07/2018 SARS coronavirus NEG   breath sounds clear to auscultation       Cardiovascular hypertension, Pt. on medications and Pt. on home beta blockers (-) angina+ CAD and + CABG   Rhythm:Regular Rate:Normal + Systolic murmurs 02/6943 Stress test: EF 60%, no ST changes    Neuro/Psych negative neurological ROS  negative psych ROS   GI/Hepatic GERD  Medicated and Poorly Controlled,(+) Cirrhosis   Esophageal Varices and ascites    , Biliary cirrhosis   Endo/Other  negative endocrine ROS  Renal/GU negative Renal ROS  negative genitourinary   Musculoskeletal   Abdominal Normal abdominal exam  (+)   Peds  Hematology  (+) FACTOR VIII (HEMOPHILIA) ,   Anesthesia Other Findings   Nuclear stress EF: 60%.  There was no ST segment deviation noted during stress.  Defect 1: There is a small defect of mild severity present in the apical inferior location. This was seen on prior study and is consistent with diaphragmatic attenuation.  The study is normal.  This is a low risk study.  The left ventricular ejection fraction is normal (55-65%).     Reproductive/Obstetrics                            Anesthesia Physical  Anesthesia Plan  ASA: III  Anesthesia Plan: General   Post-op Pain Management:    Induction:   PONV Risk Score and Plan: 3 and 4 or greater and Ondansetron, Dexamethasone and Midazolam  Airway Management Planned: LMA  Additional Equipment:  None  Intra-op Plan:   Post-operative Plan:   Informed Consent: I have reviewed the patients History and Physical, chart, labs and discussed the procedure including the risks, benefits and alternatives for the proposed anesthesia with the patient or authorized representative who has indicated his/her understanding and acceptance.     Dental advisory given  Plan Discussed with: CRNA  Anesthesia Plan Comments:         Anesthesia Quick Evaluation

## 2020-03-22 NOTE — Discharge Instructions (Signed)
Loop Electrosurgical Excision Procedure Loop electrosurgical excision procedure (LEEP) is the cutting and removal (excision) of tissue from the cervix. The cervix is the bottom part of the uterus that opens into the vagina. The tissue that is removed from the cervix is examined to see if there are precancerous cells or cancer cells present. LEEP may be done when:  You have abnormal bleeding from your cervix.  You have an abnormal Pap test result.  Your health care provider finds an abnormality on your cervix during a pelvic exam. LEEP typically only takes a few minutes and is often done in the health care provider's office. The procedure is safe for women who are trying to get pregnant. However, the procedure is usually not done during a menstrual period or during pregnancy. Tell a health care provider about:  Any allergies you have.  All medicines you are taking, including vitamins, herbs, eye drops, creams, and over-the-counter medicines.  Any blood disorders you have.  Any medical conditions you have, including current or past vaginal infections such as herpes or sexually-transmitted infections (STIs).  Whether you are pregnant or may be pregnant.  Whether or not you are having vaginal bleeding on the day of the procedure. What are the risks? Generally, this is a safe procedure. However, problems may occur, including:  Infection.  Bleeding.  Allergic reactions to medicines.  Changes or scarring in the cervix.  Increased risk of early (preterm) labor in future pregnancies. What happens before the procedure?  Ask your health care provider about: ? Changing or stopping your regular medicines. This is especially important if you are taking diabetes medicines or blood thinners. ? Taking medicines such as aspirin and ibuprofen. These medicines can thin your blood. Do not take these medicines unless your health care provider tells you to take them. ? Taking over-the-counter  medicines, vitamins, herbs, and supplements.  Your health care provider may recommend that you take pain medicine before the procedure.  Ask your health care provider if you should plan to have someone take you home after the procedure. What happens during the procedure?  An instrument called a speculum will be placed in your vagina. This will allow your health care provider to see your cervix.  You will be given a medicine to numb the area (local anesthetic). The medicine will be injected into your cervix and the surrounding area.  A solution will be applied to your cervix. This solution will help the health care provider find the abnormal cells that need to be removed.  A thin wire loop will be passed through your vagina. The wire will be used to burn (cauterize) the cervical tissue with an electrical current.  You may feel faint during the procedure. Tell your health care provider right away if you feel this way.  The abnormal cervical tissue will be removed.  Any open blood vessels will be cauterized to prevent bleeding.  A paste may be applied to the cauterized area of your cervix to help prevent bleeding.  The sample of cervical tissue will be examined under a microscope. The procedure may vary among health care providers and hospitals.   What can I expect after the procedure? After the procedure, it is common to have:  Mild abdominal cramps that are similar to menstrual cramps. These may last for up to 1 week.  A small amount of pink-tinged or bloody vaginal discharge, including light to moderate bleeding, for 1-2 weeks.  A dark-colored discharge coming from your vagina. This is  from the paste that was used on the cervix to prevent bleeding. It is up to you to get the results of your procedure. Ask your health care provider, or the department that is doing the procedure, when your results will be ready. Follow these instructions at home:  Take over-the-counter and  prescription medicines only as told by your health care provider.  Return to your normal activities as told by your health care provider. Ask your health care provider what activities are safe for you.  Do not put anything in your vagina for 2 weeks after the procedure or until your health care provider says that it is okay. This includes tampons, creams, and douches.  Do not have sex until your health care provider approves.  Keep all follow-up visits as told by your health care provider. This is important. Contact a health care provider if you:  Have a fever or chills.  Feel unusually weak.  Have vaginal bleeding that is heavier or longer than a normal menstrual cycle. A sign of this can be soaking a pad with blood or bleeding with clots.  Develop a bad smelling vaginal discharge.  Have severe abdominal pain or cramping. Summary  Loop electrosurgical excision procedure (LEEP) is the removal of tissue from the cervix. The removed tissue will be checked for precancerous cells or cancer cells.  LEEP typically only takes a few minutes and is often done in the health care provider's office.  Do not put anything in your vagina for 2 weeks after the procedure or until your health care provider says that it is okay. This includes tampons, creams, and douches.  Keep all follow-up visits as told by your health care provider. Ask your health care provider, or the department that is doing the procedure, when your results will be ready. This information is not intended to replace advice given to you by your health care provider. Make sure you discuss any questions you have with your health care provider. Document Revised: 03/05/2018 Document Reviewed: 03/05/2018 Elsevier Patient Education  2021 [place] Instructions  Activity: Get plenty of rest for the remainder of the day. A responsible individual must stay with you for 24 hours following the procedure.   For the next 24 hours, DO NOT: -Drive a car -Paediatric nurse -Drink alcoholic beverages -Take any medication unless instructed by your physician -Make any legal decisions or sign important papers.  Meals: Start with liquid foods such as gelatin or soup. Progress to regular foods as tolerated. Avoid greasy, spicy, heavy foods. If nausea and/or vomiting occur, drink only clear liquids until the nausea and/or vomiting subsides. Call your physician if vomiting continues.  Special Instructions/Symptoms: Your throat may feel dry or sore from the anesthesia or the breathing tube placed in your throat during surgery. If this causes discomfort, gargle with warm salt water. The discomfort should disappear within 24 hours.

## 2020-03-22 NOTE — Anesthesia Postprocedure Evaluation (Signed)
Anesthesia Post Note  Patient: Christine Butler  Procedure(s) Performed: COLPOSCOPY (N/A Vagina ) LOOP ELECTROSURGICAL EXCISION PROCEDURE (LEEP) (N/A Vagina ) CONIZATION CERVIX WITH BIOPSY (N/A Vagina )     Patient location during evaluation: Phase II Anesthesia Type: General Level of consciousness: awake and sedated Pain management: pain level controlled Vital Signs Assessment: post-procedure vital signs reviewed and stable Respiratory status: spontaneous breathing Cardiovascular status: stable Postop Assessment: no apparent nausea or vomiting Anesthetic complications: no   No complications documented.  Last Vitals:  Vitals:   03/22/20 0830 03/22/20 0845  BP: 135/86 129/66  Pulse: 73 72  Resp: (!) 8 19  Temp:    SpO2: 100% 100%    Last Pain:  Vitals:   03/22/20 0845  TempSrc:   PainSc: 2    Pain Goal: Patients Stated Pain Goal: 4 (03/22/20 0603)                 Huston Foley

## 2020-03-22 NOTE — Anesthesia Procedure Notes (Signed)
Procedure Name: LMA Insertion Date/Time: 03/22/2020 7:31 AM Performed by: Bonney Aid, CRNA Pre-anesthesia Checklist: Patient identified, Emergency Drugs available, Suction available and Patient being monitored Patient Re-evaluated:Patient Re-evaluated prior to induction Oxygen Delivery Method: Circle system utilized Preoxygenation: Pre-oxygenation with 100% oxygen Induction Type: IV induction Ventilation: Mask ventilation without difficulty LMA: LMA inserted LMA Size: 3.0 Number of attempts: 1 Airway Equipment and Method: Bite block Placement Confirmation: positive ETCO2 Tube secured with: Tape Dental Injury: Teeth and Oropharynx as per pre-operative assessment

## 2020-03-22 NOTE — H&P (Signed)
50 year old female here for Loop Electrosurgical Excisional Procedure Cone of the Cervix.  She has a history of cervical dysplasia.  Last PAP smear ASCUS cannot rule out high grade dysplasia   Past Medical History:  Diagnosis Date  . (HFpEF) heart failure with preserved ejection fraction ([place]) 08/09/2019  . Acute blood loss anemia 04/20/2017  . Arthritis    right knee  . Atypical chest pain 11/05/2017  . Atypical squamous cells of undetermined significance (ASCUS) on Papanicolaou smear of cervix 08/05/2018  . CAD (coronary artery disease)    a. s/p CABGx2 in 02/2017 (LAD not suitable for PCI), EF normal.  . CAD (coronary artery disease), native coronary artery 03/23/2017  . 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 Chronic low back pain   . Cirrhosis ([place]) 03/16/2017  . Coronary artery disease 03/24/2017  . Dyslipidemia, goal LDL below 70 10/13/2017  . Elevated LFTs 04/20/2017  . Encephalopathy 09/29/2019   hepatic encephalopathy  . Familial hyperlipidemia   . GERD (gastroesophageal reflux disease)   . GI bleed 02/01/2018  . H/O LEEP 03/30/2019  . H/O two vessel coronary artery bypass graft 03/30/2019  . High grade squamous intraepithelial lesion (HGSIL) on cytologic smear of cervix 05/04/2019  . Hypertension   . Hypo-osmolality and hyponatremia 03/30/2019  . IBS (irritable bowel syndrome)   . Other insomnia 09/05/2019  . Pancytopenia ([place]) 09/28/2019  . PONV (postoperative nausea and vomiting)    i always throw up on waking up , but last EGD in march had no issues    . PONV (postoperative nausea and vomiting)    likes zofran and steroid to help does not like scopolamine patch  . Port-A-Cath in place 11/21/2019  . 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 IMOUPDATE  . Primary biliary cirrhosis (HCC)    cirhosis/liver disease followed by transplant team led by Dr Manuella Ghazi at [facility]   . S/P CABG (coronary artery  bypass graft)   . S/P CABG x 2 03/24/2017   LIMA to DIAGONAL Portion of SVG/LEFT RADIAL to LAD  . S/P LEEP (loop electrosurgical excision procedure) 05/11/2019  . Status post liver transplantation ([place]) 03/30/2019  . SVD (spontaneous vaginal delivery)    x 3  . Upper GI bleed 02/01/2018  . Urinary incontinence    occ  . Urinary tract bacterial infections last oct 2021  . Wears glasses    Past Surgical History:  Procedure Laterality Date  . CHOLECYSTECTOMY  09/2019  . CORONARY ARTERY BYPASS GRAFT N/A 03/24/2017   Procedure: CORONARY ARTERY BYPASS GRAFTING (CABG) x two, using left internal mammary artery, left radial artery, and right leg greater saphenous vein harvested endoscopically;  Surgeon: Ivin Poot, MD;  Location: [facility] [facility];  Service: Open Heart Surgery;  Laterality: N/A;  . ENDOVEIN HARVEST OF GREATER SAPHENOUS VEIN Right 03/24/2017   Procedure: ENDOVEIN HARVEST OF GREATER SAPHENOUS VEIN;  Surgeon: Ivin Poot, MD;  Location: [facility];  Service: Open Heart Surgery;  Laterality: Right;  . ESOPHAGEAL BANDING  02/01/2018   Procedure: ESOPHAGEAL BANDING;  Surgeon: [name], MD;  Location: [facility];  Service: Gastroenterology;;  . ESOPHAGEAL BANDING N/A 03/29/2018   Procedure: ESOPHAGEAL BANDING;  Surgeon: [name], MD;  Location: [facility];  Service: Gastroenterology;  Laterality: N/A;  . ESOPHAGEAL BANDING N/A 05/21/2018   Procedure: ESOPHAGEAL BANDING;  Surgeon: [name], MD;  Location: [facility];  Service: Gastroenterology;  Laterality: N/A;  .  ESOPHAGEAL BANDING N/A 10/11/2018   Procedure: ESOPHAGEAL BANDING;  Surgeon: [name], MD;  Location: [facility];  Service: Gastroenterology;  Laterality: N/A;  . ESOPHAGOGASTRODUODENOSCOPY N/A 03/29/2018   Procedure: ESOPHAGOGASTRODUODENOSCOPY (EGD);  Surgeon: [name], MD;  Location: [facility];  Service: Gastroenterology;  Laterality: N/A;  . ESOPHAGOGASTRODUODENOSCOPY N/A 05/21/2018   Procedure:  ESOPHAGOGASTRODUODENOSCOPY (EGD);  Surgeon: [name], MD;  Location: [facility];  Service: Gastroenterology;  Laterality: N/A;  . ESOPHAGOGASTRODUODENOSCOPY (EGD) WITH PROPOFOL N/A 02/01/2018   Procedure: ESOPHAGOGASTRODUODENOSCOPY (EGD) WITH PROPOFOL;  Surgeon: [name], MD;  Location: [facility];  Service: Gastroenterology;  Laterality: N/A;  . ESOPHAGOGASTRODUODENOSCOPY (EGD) WITH PROPOFOL N/A 10/11/2018   Procedure: ESOPHAGOGASTRODUODENOSCOPY (EGD) WITH PROPOFOL;  Surgeon: [name], MD;  Location: [facility];  Service: Gastroenterology;  Laterality: N/A;  . INCONTINENCE SURGERY  2009   urinary  bladder sling  . IR IMAGING GUIDED PORT INSERTION  04/08/2019  . LEEP N/A 03/28/2014   Procedure: LOOP ELECTROSURGICAL EXCISION PROCEDURE (LEEP) cone biopsy;  Surgeon: Cyril Mourning, MD;  Location: [facility];  Service: Gynecology;  Laterality: N/A;  . LEFT HEART CATH AND CORONARY ANGIOGRAPHY N/A 03/23/2017   Procedure: LEFT HEART CATH AND CORONARY ANGIOGRAPHY;  Surgeon: Jettie Booze, MD;  Location: [facility];  Service: Cardiovascular;  Laterality: N/A;  . LIVER BIOPSY     x 2  . LIVER TRANSPLANT  08/ 24/2021  . RADIAL ARTERY HARVEST Left 03/24/2017   Procedure: RADIAL ARTERY HARVEST;  Surgeon: Ivin Poot, MD;  Location: [facility];  Service: Open Heart Surgery;  Laterality: Left;  . TEE WITHOUT CARDIOVERSION N/A 03/24/2017   Procedure: TRANSESOPHAGEAL ECHOCARDIOGRAM (TEE);  Surgeon: Prescott Gum, Collier Salina, MD;  Location: [facility];  Service: Open Heart Surgery;  Laterality: N/A;  . WISDOM TOOTH EXTRACTION  yrs ago   BP 138/88   Pulse 80   Temp (!) 97 F (36.1 C) (Oral)   Resp 15   Ht 5\' 2"  (1.575 m)   Wt 60.4 kg   LMP 06/24/2017   SpO2 100%   BMI 24.36 kg/m  Results for orders placed or performed during the hospital encounter of 03/22/20 (from the past 24 hour(s))  I-STAT, chem 8     Status: Abnormal   Collection Time: 03/22/20  6:32 AM  Result Value Ref Range   Sodium  139 135 - 145 mmol/L   Potassium 5.4 (H) 3.5 - 5.1 mmol/L   Chloride 111 98 - 111 mmol/L   BUN 34 (H) 6 - 20 mg/dL   Creatinine, Ser 1.00 0.44 - 1.00 mg/dL   Glucose, Bld 92 70 - 99 mg/dL   Calcium, Ion 1.09 (L) 1.15 - 1.40 mmol/L   TCO2 20 (L) 22 - 32 mmol/L   Hemoglobin 10.5 (L) 12.0 - 15.0 g/dL   HCT 31.0 (L) 36.0 - 46.0 %   Abdomen is soft and non tender Lung CTAB Car RRR  IMPRESSION: Cervical Dysplasia  PLAN: Colposcopy Loop Electrosurgical Excisional Procedure of Cervix (Cone) Risks reviewed Consent is signed

## 2020-03-23 NOTE — Op Note (Signed)
Christine Butler, AISPURO MEDICAL RECORD [id number] ACCOUNT [id number] DATE OF BIRTH:October 07, 1970 FACILITY: [facility] LOCATION: [facility] PHYSICIAN:Deasia Chiu Lynett Fish, MD  OPERATIVE REPORT  DATE OF PROCEDURE:  03/22/2020  PREOPERATIVE DIAGNOSES:  Cervical dysplasia with recent Pap smear showing atypical squamous intraepithelial neoplasia, cannot rule out high-grade dysplasia, status post LEEP cone biopsy and status post liver transplant in 2021.  POSTOPERATIVE DIAGNOSES:  Cervical dysplasia with recent Pap smear showing atypical squamous intraepithelial neoplasia, cannot rule out high-grade dysplasia, status post LEEP cone biopsy and status post liver transplant in 2021.  PROCEDURE:  Colposcopy, loop electrosurgical excisional procedure of the cervix, conization of the cervix with biopsies.  SURGEON:  Dian Queen, MD  ANESTHESIA:  Paracervical block with MAC.  ESTIMATED BLOOD LOSS:  Minimal.  COMPLICATIONS:  None.  PATHOLOGY: 1.  Ectocervix. 2.  Endocervix #1. 3.  Endocervix #2.  PROCEDURE IN DETAIL:  This patient was taken to the operating room.  She was administered anesthesia in the standard fashion and she was placed in the lithotomy position.  An appropriate timeout was performed and a speculum was then inserted into the  vagina and the cervix was then cleansed with acetic acid.  I then performed a colposcopy, since she had, had a LEEP, her cervix was very much flush with the vaginal wall, but I did not see any acetowhite epithelium, no mosaicism and no punctation.  The  presumption is that there may be some abnormal cells in the endocervical canal.  At this point, the speculum was removed.  The cervix, vagina and vulva were then cleansed with Hibiclens.  I then inserted an insulated speculum and then a paracervical  block was performed in the standard fashion.   Lugol solution was applied.  I then performed a LEEP cone biopsy of the cervix with an ectocervix specimen and  endocervix #1 and endocervix #2.  Hemostasis was very good, but I did use a rollerball for  hemostasis and then used the Monsel solution as well.  Hemostasis was very good.  At the end of the procedure, all instruments were removed from the vagina.  The patient tolerated the procedure well.  After she was awake, she went to recovery room in  stable condition.  All sponge, lap and instrument counts were correct x2.  HN/NUANCE  D:03/22/2020 T:03/22/2020 JOB:[id number]

## 2020-03-26 ENCOUNTER — Encounter (HOSPITAL_BASED_OUTPATIENT_CLINIC_OR_DEPARTMENT_OTHER): Payer: Self-pay | Admitting: Obstetrics and Gynecology

## 2020-03-26 ENCOUNTER — Ambulatory Visit: Admit: 2020-03-26 | Discharge: 2020-03-30 | Payer: PRIVATE HEALTH INSURANCE

## 2020-03-26 ENCOUNTER — Encounter
Admit: 2020-03-26 | Discharge: 2020-03-30 | Payer: PRIVATE HEALTH INSURANCE | Attending: Student in an Organized Health Care Education/Training Program | Primary: Student in an Organized Health Care Education/Training Program

## 2020-03-26 DIAGNOSIS — Z944 Liver transplant status: Principal | ICD-10-CM

## 2020-03-26 DIAGNOSIS — K449 Diaphragmatic hernia without obstruction or gangrene: Principal | ICD-10-CM

## 2020-03-26 DIAGNOSIS — I2510 Atherosclerotic heart disease of native coronary artery without angina pectoris: Principal | ICD-10-CM

## 2020-03-26 DIAGNOSIS — Z79899 Other long term (current) drug therapy: Principal | ICD-10-CM

## 2020-03-26 DIAGNOSIS — K831 Obstruction of bile duct: Principal | ICD-10-CM

## 2020-03-26 DIAGNOSIS — Z951 Presence of aortocoronary bypass graft: Principal | ICD-10-CM

## 2020-03-26 DIAGNOSIS — K219 Gastro-esophageal reflux disease without esophagitis: Principal | ICD-10-CM

## 2020-03-26 DIAGNOSIS — Z4659 Encounter for fitting and adjustment of other gastrointestinal appliance and device: Principal | ICD-10-CM

## 2020-03-26 DIAGNOSIS — K9189 Other postprocedural complications and disorders of digestive system: Principal | ICD-10-CM

## 2020-03-26 DIAGNOSIS — T85590A Other mechanical complication of bile duct prosthesis, initial encounter: Secondary | ICD-10-CM | POA: Diagnosis not present

## 2020-03-26 LAB — SURGICAL PATHOLOGY

## 2020-03-27 DIAGNOSIS — Z944 Liver transplant status: Principal | ICD-10-CM

## 2020-03-27 DIAGNOSIS — Z79899 Other long term (current) drug therapy: Principal | ICD-10-CM

## 2020-03-27 DIAGNOSIS — L578 Other skin changes due to chronic exposure to nonionizing radiation: Secondary | ICD-10-CM | POA: Diagnosis not present

## 2020-03-27 DIAGNOSIS — Z8619 Personal history of other infectious and parasitic diseases: Secondary | ICD-10-CM | POA: Diagnosis not present

## 2020-03-27 DIAGNOSIS — D1801 Hemangioma of skin and subcutaneous tissue: Secondary | ICD-10-CM | POA: Diagnosis not present

## 2020-03-27 DIAGNOSIS — L813 Cafe au lait spots: Secondary | ICD-10-CM | POA: Diagnosis not present

## 2020-03-27 DIAGNOSIS — L905 Scar conditions and fibrosis of skin: Secondary | ICD-10-CM | POA: Diagnosis not present

## 2020-03-27 DIAGNOSIS — L814 Other melanin hyperpigmentation: Secondary | ICD-10-CM | POA: Diagnosis not present

## 2020-03-27 DIAGNOSIS — L570 Actinic keratosis: Secondary | ICD-10-CM | POA: Diagnosis not present

## 2020-03-27 MED FILL — MYCOPHENOLATE SODIUM 180 MG: 180 | 30 days supply | Qty: 60 | Fill #3

## 2020-03-27 MED FILL — CARVEDILOL 25 MG TABS: 25 | 30 days supply | Qty: 90 | Fill #2

## 2020-03-28 ENCOUNTER — Telehealth: Admit: 2020-03-28 | Discharge: 2020-03-29 | Payer: PRIVATE HEALTH INSURANCE | Attending: Clinical | Primary: Clinical

## 2020-03-28 DIAGNOSIS — F329 Major depressive disorder, single episode, unspecified: Principal | ICD-10-CM

## 2020-03-29 DIAGNOSIS — Z944 Liver transplant status: Principal | ICD-10-CM

## 2020-03-29 DIAGNOSIS — Z79899 Other long term (current) drug therapy: Principal | ICD-10-CM

## 2020-04-02 DIAGNOSIS — Z79899 Other long term (current) drug therapy: Principal | ICD-10-CM

## 2020-04-02 DIAGNOSIS — Z944 Liver transplant status: Principal | ICD-10-CM

## 2020-04-03 DIAGNOSIS — Z79899 Other long term (current) drug therapy: Principal | ICD-10-CM

## 2020-04-03 DIAGNOSIS — Z944 Liver transplant status: Principal | ICD-10-CM

## 2020-04-03 MED FILL — ENVARSUS XR 4 MG TB24: 4 | 30 days supply | Qty: 60 | Fill #1

## 2020-04-05 ENCOUNTER — Other Ambulatory Visit: Payer: Self-pay

## 2020-04-05 ENCOUNTER — Ambulatory Visit: Payer: 59 | Attending: Family Medicine | Admitting: Physical Therapy

## 2020-04-05 ENCOUNTER — Encounter: Payer: Self-pay | Admitting: Physical Therapy

## 2020-04-05 DIAGNOSIS — Z79899 Other long term (current) drug therapy: Principal | ICD-10-CM

## 2020-04-05 DIAGNOSIS — Z944 Liver transplant status: Principal | ICD-10-CM

## 2020-04-05 DIAGNOSIS — M6281 Muscle weakness (generalized): Secondary | ICD-10-CM | POA: Insufficient documentation

## 2020-04-05 DIAGNOSIS — R2689 Other abnormalities of gait and mobility: Secondary | ICD-10-CM | POA: Diagnosis not present

## 2020-04-05 DIAGNOSIS — M62830 Muscle spasm of back: Secondary | ICD-10-CM | POA: Insufficient documentation

## 2020-04-05 DIAGNOSIS — R293 Abnormal posture: Secondary | ICD-10-CM | POA: Diagnosis not present

## 2020-04-05 DIAGNOSIS — R2681 Unsteadiness on feet: Secondary | ICD-10-CM | POA: Insufficient documentation

## 2020-04-05 MED FILL — OMEPRAZOLE 40 MG CPDR: 40 | 30 days supply | Qty: 30 | Fill #1

## 2020-04-05 MED FILL — SULFAMETHOXAZOLE-TMP SS TAB: 400-80 | 28 days supply | Qty: 12 | Fill #2

## 2020-04-05 NOTE — Therapy (Addendum)
[place], [place], [address] Phone: [phone]   Fax:  [phone]  Physical Therapy Treatment / Re-certification / Discharge  Patient Details  Name: Christine Butler MRN: [id number] Date of Birth: 09/12/1970 Referring Provider (PT): Benna Dunks, NP    Encounter Date: 04/05/2020   PT End of Session - 04/05/20 1335    Visit Number 14    Number of Visits 18    Date for PT Re-Evaluation 05/10/20    Authorization Type [facility] UMR    PT Start Time 1330    PT Stop Time 1411    PT Time Calculation (min) 41 min    Activity Tolerance Patient tolerated treatment well    Behavior During Therapy [facility] for tasks assessed/performed           Past Medical History:  Diagnosis Date  . (HFpEF) heart failure with preserved ejection fraction ([place]) 08/09/2019  . Acute blood loss anemia 04/20/2017  . Arthritis    right knee  . Atypical chest pain 11/05/2017  . Atypical squamous cells of undetermined significance (ASCUS) on Papanicolaou smear of cervix 08/05/2018  . CAD (coronary artery disease)    a. s/p CABGx2 in 02/2017 (LAD not suitable for PCI), EF normal.  . CAD (coronary artery disease), native coronary artery 03/23/2017  . 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 Chronic low back pain   . Cirrhosis ([place]) 03/16/2017  . Coronary artery disease 03/24/2017  . Dyslipidemia, goal LDL below 70 10/13/2017  . Elevated LFTs 04/20/2017  . Encephalopathy 09/29/2019   hepatic encephalopathy  . Familial hyperlipidemia   . GERD (gastroesophageal reflux disease)   . GI bleed 02/01/2018  . H/O LEEP 03/30/2019  . H/O two vessel coronary artery bypass graft 03/30/2019  . High grade squamous intraepithelial lesion (HGSIL) on cytologic smear of cervix 05/04/2019  . Hypertension   . Hypo-osmolality and hyponatremia 03/30/2019  . IBS (irritable bowel syndrome)   . Other insomnia 09/05/2019  . Pancytopenia ([place]) 09/28/2019  .  PONV (postoperative nausea and vomiting)    i always throw up on waking up , but last EGD in march had no issues    . PONV (postoperative nausea and vomiting)    likes zofran and steroid to help does not like scopolamine patch  . Port-A-Cath in place 11/21/2019  . Primary biliary cholangitis ([place]) 07/26/2012   Formatting of this note might be different from the original. IMOUPDATE  . Primary biliary cirrhosis ([facility])    cirhosis/liver disease followed by transplant team led by Dr Manuella Ghazi at [facility]   . S/P CABG (coronary artery bypass graft)   . S/P CABG x 2 03/24/2017   LIMA to DIAGONAL Portion of SVG/LEFT RADIAL to LAD  . S/P LEEP (loop electrosurgical excision procedure) 05/11/2019  . Status post liver transplantation ([place]) 03/30/2019  . SVD (spontaneous vaginal delivery)    x 3  . Upper GI bleed 02/01/2018  . Urinary incontinence    occ  . Urinary tract bacterial infections last oct 2021  . Wears glasses     Past Surgical History:  Procedure Laterality Date  . CERVICAL CONIZATION W/BX N/A 03/22/2020   Procedure: CONIZATION CERVIX WITH BIOPSY;  Surgeon: Dian Queen, MD;  Location: [facility];  Service: Gynecology;  Laterality: N/A;  . CHOLECYSTECTOMY  09/2019  . COLPOSCOPY N/A 03/22/2020   Procedure: COLPOSCOPY;  Surgeon: Dian Queen, MD;  Location: [facility];  Service: Gynecology;  Laterality: N/A;  . CORONARY ARTERY BYPASS GRAFT N/A 03/24/2017   Procedure: CORONARY ARTERY BYPASS GRAFTING (CABG) x two, using left internal mammary artery, left radial artery, and right leg greater saphenous vein harvested endoscopically;  Surgeon: Ivin Poot, MD;  Location: [facility];  Service: Open Heart Surgery;  Laterality: N/A;  . ENDOVEIN HARVEST OF GREATER SAPHENOUS VEIN Right 03/24/2017   Procedure: ENDOVEIN HARVEST OF GREATER SAPHENOUS VEIN;  Surgeon: Ivin Poot, MD;  Location: [facility];  Service: Open Heart Surgery;  Laterality: Right;  . ESOPHAGEAL  BANDING  02/01/2018   Procedure: ESOPHAGEAL BANDING;  Surgeon: [name], MD;  Location: [facility];  Service: Gastroenterology;;  . ESOPHAGEAL BANDING N/A 03/29/2018   Procedure: ESOPHAGEAL BANDING;  Surgeon: [name], MD;  Location: [facility];  Service: Gastroenterology;  Laterality: N/A;  . ESOPHAGEAL BANDING N/A 05/21/2018   Procedure: ESOPHAGEAL BANDING;  Surgeon: [name], MD;  Location: [facility];  Service: Gastroenterology;  Laterality: N/A;  . ESOPHAGEAL BANDING N/A 10/11/2018   Procedure: ESOPHAGEAL BANDING;  Surgeon: [name], MD;  Location: [facility];  Service: Gastroenterology;  Laterality: N/A;  . ESOPHAGOGASTRODUODENOSCOPY N/A 03/29/2018   Procedure: ESOPHAGOGASTRODUODENOSCOPY (EGD);  Surgeon: [name], MD;  Location: [facility];  Service: Gastroenterology;  Laterality: N/A;  . ESOPHAGOGASTRODUODENOSCOPY N/A 05/21/2018   Procedure: ESOPHAGOGASTRODUODENOSCOPY (EGD);  Surgeon: [name], MD;  Location: [facility];  Service: Gastroenterology;  Laterality: N/A;  . ESOPHAGOGASTRODUODENOSCOPY (EGD) WITH PROPOFOL N/A 02/01/2018   Procedure: ESOPHAGOGASTRODUODENOSCOPY (EGD) WITH PROPOFOL;  Surgeon: [name], MD;  Location: [facility];  Service: Gastroenterology;  Laterality: N/A;  . ESOPHAGOGASTRODUODENOSCOPY (EGD) WITH PROPOFOL N/A 10/11/2018   Procedure: ESOPHAGOGASTRODUODENOSCOPY (EGD) WITH PROPOFOL;  Surgeon: [name], MD;  Location: [facility];  Service: Gastroenterology;  Laterality: N/A;  . INCONTINENCE SURGERY  2009   urinary  bladder sling  . IR IMAGING GUIDED PORT INSERTION  04/08/2019  . LEEP N/A 03/28/2014   Procedure: LOOP ELECTROSURGICAL EXCISION PROCEDURE (LEEP) cone biopsy;  Surgeon: Cyril Mourning, MD;  Location: [facility];  Service: Gynecology;  Laterality: N/A;  . LEEP N/A 03/22/2020   Procedure: LOOP ELECTROSURGICAL EXCISION PROCEDURE (LEEP);  Surgeon: Dian Queen, MD;  Location: [facility];  Service: Gynecology;   Laterality: N/A;  . LEFT HEART CATH AND CORONARY ANGIOGRAPHY N/A 03/23/2017   Procedure: LEFT HEART CATH AND CORONARY ANGIOGRAPHY;  Surgeon: Jettie Booze, MD;  Location: [facility];  Service: Cardiovascular;  Laterality: N/A;  . LIVER BIOPSY     x 2  . LIVER TRANSPLANT  08/ 24/2021  . RADIAL ARTERY HARVEST Left 03/24/2017   Procedure: RADIAL ARTERY HARVEST;  Surgeon: Ivin Poot, MD;  Location: [facility];  Service: Open Heart Surgery;  Laterality: Left;  . TEE WITHOUT CARDIOVERSION N/A 03/24/2017   Procedure: TRANSESOPHAGEAL ECHOCARDIOGRAM (TEE);  Surgeon: Prescott Gum, Collier Salina, MD;  Location: [facility];  Service: Open Heart Surgery;  Laterality: N/A;  . WISDOM TOOTH EXTRACTION  yrs ago    There were no vitals filed for this visit.   Subjective Assessment - 04/05/20 1333    Subjective pt reports limited consistency with her HEP and reports it is related to being in more of a "funk".    Patient Stated Goals get back to walking a mile, increase stamina, promote strength and return work.    Currently in Pain? No/denies    Aggravating Factors  prolonged inactivity    Pain Relieving Factors stretching and popping  [facility] PT Assessment - 04/05/20 0001      Assessment   Medical Diagnosis Other malaise R53.81, Liver transplant status Z94.4    Referring Provider (PT) Martin-Velez, Placido Sou, NP       Observation/Other Assessments   Focus on Therapeutic Outcomes (FOTO)  85% function      Strength   Right Shoulder Flexion 4/5    Right Shoulder Extension 4+/5    Right Shoulder ABduction 4/5    Right Shoulder Internal Rotation 4+/5    Right Shoulder External Rotation 4+/5    Left Shoulder Flexion 4/5    Left Shoulder Extension 4+/5    Left Shoulder ABduction 4/5    Left Shoulder Internal Rotation 4+/5    Left Shoulder External Rotation 4+/5    Right Hip Flexion 5/5    Right Hip ABduction 4/5    Left Hip Flexion 5/5    Left Hip ABduction 4+/5    Left Hip ADduction  4+/5    Right Knee Flexion 5/5    Right Knee Extension 5/5    Left Knee Flexion 5/5    Left Knee Extension 5/5                         [facility] Adult PT Treatment/Exercise - 04/05/20 0001      Knee/Hip Exercises: Aerobic   Elliptical L2 x 5 min ramp L2      Knee/Hip Exercises: Standing   Hip Abduction Stengthening;Both;Knee straight;10 reps;2 sets   with green theraband   Hip Extension Stengthening;2 sets;10 reps;Knee straight;Both   with with green theraband     Shoulder Exercises: Standing   Other Standing Exercises pallof press with rotation using 2 x 10 bil using freemotion 7#    Other Standing Exercises thoracic rotation 1 x 20 bil with arms crossed      Shoulder Exercises: Power Warden/ranger Exercises rows 2 x 12 bil 10# (using free motion)                  PT Education - 04/05/20 1410    Education Details reviewed HEP and importance of consistency working on a couple exercises daily. discussed reassessment findings compared to inital evaluation. Reviewed FOTO assessment.    Person(s) Educated Patient    Methods Explanation;Verbal cues;Handout    Comprehension Verbalized understanding;Verbal cues required            PT Short Term Goals - 01/23/20 1146      PT SHORT TERM GOAL #1   Title pt to be IND with initial HEP    Time 4    Period Weeks    Status Achieved    Target Date 12/22/19      PT SHORT TERM GOAL #2   Title increase bil hip abductor / extensor strength to >/ 3-/5 to promote hip stability    Time 4    Period Weeks    Status Achieved    Target Date 12/22/19      PT SHORT TERM GOAL #3   Title increase 5 x sit to stand  and TUG to </= 13 seconds to work toward functional / safe transitions    Time 4    Period Weeks    Status Achieved    Target Date 12/22/19             PT Long Term Goals - 04/05/20 1338      PT LONG TERM GOAL #1  Title increase bil LE gross strength to 4+/5 to promtoe hip/ knee stability  and promote efficient posture    Period Weeks    Status Partially Met      PT LONG TERM GOAL #2   Title increase bil Gross UE strength to >/= 4+/5 to assist with functional lifting/ carrying / pushing and pull as it relates to potential work related tasks.    Period Weeks    Status Partially Met      PT LONG TERM GOAL #3   Title improve 5 x STS to </= 12 seconds and TUG to </= 10 seconds to for functiona/ efficent and safe mobility    Period Weeks    Status Achieved      PT LONG TERM GOAL #4   Title pt to be able to stand and walk for >/= 1 hour for functional endurnace required for work tasks and community ambulation with no report of pain    Period Weeks    Status Achieved      PT LONG TERM GOAL #5   Title increase FOTO score to </=44% limited to demo improvement in function    Period Weeks      PT LONG TERM GOAL #6   Title pt to be IND with all HEP to maintain and progress current LOF IND    Period Weeks    Status Partially Met                 Plan - 04/05/20 1402    Clinical Impression Statement patient arrives back to PT since her last session on 03/15/2019. She reports she feels like she is getting strong and doing better but lacks motivation for exercise. She has made execelent progress toward her goals and increased her FOTO score to 85% function. She would beneift from continued physical therapy to focus on gross UE/LE with emphasis on endurance training, working 1 x a week for 4 weeks.    PT Frequency 1x / week    PT Duration 4 weeks    PT Treatment/Interventions ADLs/Self Care Home Management;Moist Heat;Cryotherapy;Gait training;Stair training;Functional mobility training;Therapeutic activities;Therapeutic exercise;Balance training;Neuromuscular re-education;Manual techniques;Passive range of motion;Taping;Patient/family education    PT Next Visit Plan Re-evaluation. Hip strengthening (esp ABD and EXT), Core strengthening, UE work. Body mechanics for work related  tasks.    PT Home Exercise Plan [phone] - low back stretch (seated walking hands on chair), sit to stand with hands on knees, lower trunk rotations, standing hip flexor stretch, STS, Heel taps off step, wall squat, anti-rotation step outs,           Patient will benefit from skilled therapeutic intervention in order to improve the following deficits and impairments:  Abnormal gait,Improper body mechanics,Postural dysfunction,Increased muscle spasms,Decreased strength,Pain,Decreased activity tolerance,Decreased balance,Decreased endurance  Visit Diagnosis: Muscle spasm of back  Muscle weakness (generalized)  Other abnormalities of gait and mobility  Abnormal posture  Unsteadiness on feet     Problem List Patient Active Problem List   Diagnosis Date Noted  . S/P CABG (coronary artery bypass graft)   . Port-A-Cath in place 11/21/2019  . Encephalopathy 09/29/2019  . Pancytopenia ([place]) 09/28/2019  . Arthritis 09/21/2019  . PONV (postoperative nausea and vomiting) 09/21/2019  . SVD (spontaneous vaginal delivery) 09/21/2019  . Urinary tract bacterial infections 09/21/2019  . CAD (coronary artery disease)   . Other insomnia 09/05/2019  . 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 Altered mental status 08/14/2019  . (HFpEF) heart failure with preserved ejection fraction ([place])  08/09/2019  . S/P LEEP (loop electrosurgical excision procedure) 05/11/2019  . High grade squamous intraepithelial lesion (HGSIL) on cytologic smear of cervix 05/04/2019  . Familial hyperlipidemia 03/30/2019  . H/O two vessel coronary artery bypass graft 03/30/2019  . Status post liver transplantation ([place]) 03/30/2019  . Hypo-osmolality and hyponatremia 03/30/2019  . Atypical squamous cells of undetermined significance (ASCUS) on Papanicolaou smear of cervix 08/05/2018  . Upper GI bleed 02/01/2018  . GI bleed 02/01/2018  . Ascites--mild this admit 11/06/2017  . Atypical chest pain 11/05/2017  . Dyslipidemia, goal LDL  below 70 10/13/2017  . Acute blood loss anemia 04/20/2017  . Elevated LFTs 04/20/2017  . Coronary artery disease 03/24/2017  . S/P CABG x 2 03/24/2017  . CAD (coronary artery disease), native coronary artery 03/23/2017  . Cirrhosis ([place]) 03/16/2017  . GERD (gastroesophageal reflux disease) 01/28/2012  . Primary biliary cirrhosis ([place]) 01/28/2012  . IBS (irritable bowel syndrome) 01/28/2012  . Primary biliary cholangitis ([place]) 01/28/2012   Starr Lake PT, DPT, LAT, ATC  04/05/20  2:13 PM      [facility] [address] [place], [place], [address] Phone: [phone]   Fax:  [phone]  Name: Christine Butler MRN: [id number] Date of Birth: [birth year]      PHYSICAL THERAPY DISCHARGE SUMMARY  Visits from Start of Care: 14  Current functional level related to goals / functional outcomes: See goals   Remaining deficits: As of last attended session pt was doing better. Current status unknown due to pt not returning     Education / Equipment: HEP  Plan: Patient agrees to discharge.  Patient goals were partially met. Patient is being discharged due to not returning since the last visit.  ?????         Tracee Mccreery PT, DPT, LAT, ATC  05/01/20  1:04 PM

## 2020-04-09 DIAGNOSIS — Z79899 Other long term (current) drug therapy: Principal | ICD-10-CM

## 2020-04-09 DIAGNOSIS — Z944 Liver transplant status: Principal | ICD-10-CM

## 2020-04-10 DIAGNOSIS — Z944 Liver transplant status: Principal | ICD-10-CM

## 2020-04-10 DIAGNOSIS — Z79899 Other long term (current) drug therapy: Principal | ICD-10-CM

## 2020-04-11 DIAGNOSIS — E559 Vitamin D deficiency, unspecified: Principal | ICD-10-CM

## 2020-04-11 DIAGNOSIS — Z79899 Other long term (current) drug therapy: Principal | ICD-10-CM

## 2020-04-11 DIAGNOSIS — Z944 Liver transplant status: Principal | ICD-10-CM

## 2020-04-11 DIAGNOSIS — E785 Hyperlipidemia, unspecified: Principal | ICD-10-CM

## 2020-04-12 ENCOUNTER — Telehealth: Admit: 2020-04-12 | Discharge: 2020-04-13 | Payer: PRIVATE HEALTH INSURANCE | Attending: Clinical | Primary: Clinical

## 2020-04-12 DIAGNOSIS — Z944 Liver transplant status: Principal | ICD-10-CM

## 2020-04-12 DIAGNOSIS — F329 Major depressive disorder, single episode, unspecified: Principal | ICD-10-CM

## 2020-04-12 DIAGNOSIS — Z79899 Other long term (current) drug therapy: Principal | ICD-10-CM

## 2020-04-13 ENCOUNTER — Telehealth
Admit: 2020-04-13 | Discharge: 2020-04-14 | Payer: PRIVATE HEALTH INSURANCE | Attending: Psychologist | Primary: Psychologist

## 2020-04-13 DIAGNOSIS — Z8659 Personal history of other mental and behavioral disorders: Secondary | ICD-10-CM | POA: Diagnosis not present

## 2020-04-13 DIAGNOSIS — Z944 Liver transplant status: Secondary | ICD-10-CM | POA: Diagnosis not present

## 2020-04-13 MED FILL — predniSONE 5 MG TABS: 5 | 30 days supply | Qty: 30 | Fill #3

## 2020-04-13 MED FILL — MIRTAZAPINE 30 MG TABLET: 30 | 30 days supply | Qty: 30 | Fill #1

## 2020-04-16 ENCOUNTER — Other Ambulatory Visit (HOSPITAL_COMMUNITY): Payer: Self-pay

## 2020-04-16 ENCOUNTER — Institutional Professional Consult (permissible substitution): Admit: 2020-04-16 | Discharge: 2020-04-16 | Payer: PRIVATE HEALTH INSURANCE

## 2020-04-16 ENCOUNTER — Ambulatory Visit
Admit: 2020-04-16 | Discharge: 2020-04-16 | Payer: PRIVATE HEALTH INSURANCE | Attending: Nutritionist | Primary: Nutritionist

## 2020-04-16 ENCOUNTER — Ambulatory Visit
Admit: 2020-04-16 | Discharge: 2020-04-16 | Payer: PRIVATE HEALTH INSURANCE | Attending: Student in an Organized Health Care Education/Training Program | Primary: Student in an Organized Health Care Education/Training Program

## 2020-04-16 DIAGNOSIS — Z95828 Presence of other vascular implants and grafts: Principal | ICD-10-CM

## 2020-04-16 DIAGNOSIS — Z79899 Other long term (current) drug therapy: Principal | ICD-10-CM

## 2020-04-16 DIAGNOSIS — E559 Vitamin D deficiency, unspecified: Principal | ICD-10-CM

## 2020-04-16 DIAGNOSIS — Z944 Liver transplant status: Principal | ICD-10-CM

## 2020-04-16 DIAGNOSIS — E785 Hyperlipidemia, unspecified: Principal | ICD-10-CM

## 2020-04-16 DIAGNOSIS — I2510 Atherosclerotic heart disease of native coronary artery without angina pectoris: Secondary | ICD-10-CM | POA: Diagnosis not present

## 2020-04-16 DIAGNOSIS — Z8719 Personal history of other diseases of the digestive system: Secondary | ICD-10-CM | POA: Diagnosis not present

## 2020-04-16 DIAGNOSIS — R197 Diarrhea, unspecified: Secondary | ICD-10-CM | POA: Diagnosis not present

## 2020-04-16 DIAGNOSIS — L659 Nonscarring hair loss, unspecified: Secondary | ICD-10-CM | POA: Diagnosis not present

## 2020-04-16 DIAGNOSIS — Z4823 Encounter for aftercare following liver transplant: Secondary | ICD-10-CM | POA: Diagnosis not present

## 2020-04-16 DIAGNOSIS — Z7952 Long term (current) use of systemic steroids: Secondary | ICD-10-CM | POA: Diagnosis not present

## 2020-04-16 DIAGNOSIS — M62830 Muscle spasm of back: Secondary | ICD-10-CM | POA: Diagnosis not present

## 2020-04-16 DIAGNOSIS — Z951 Presence of aortocoronary bypass graft: Secondary | ICD-10-CM | POA: Diagnosis not present

## 2020-04-16 DIAGNOSIS — I509 Heart failure, unspecified: Secondary | ICD-10-CM | POA: Diagnosis not present

## 2020-04-16 MED ORDER — MYCOPHENOLATE SODIUM 180 MG TABLET,DELAYED RELEASE
ORAL_TABLET | Freq: Two times a day (BID) | ORAL | 11 refills | 30 days | Status: CP
Start: 2020-04-16 — End: 2021-04-16

## 2020-04-16 MED ORDER — URSODIOL 300 MG CAPSULE
ORAL_CAPSULE | Freq: Three times a day (TID) | ORAL | 3 refills | 90.00000 days | Status: CP
Start: 2020-04-16 — End: 2021-04-16

## 2020-04-16 MED ORDER — FINASTERIDE 5 MG TABLET
ORAL_TABLET | Freq: Every day | ORAL | 3 refills | 90 days | Status: CP
Start: 2020-04-16 — End: 2021-04-16

## 2020-04-16 MED FILL — FINASTERIDE 5 MG TABLET: 5 | 90 days supply | Qty: 45 | Fill #0

## 2020-04-16 MED FILL — URSODIOL 300 MG CAPS: 300 | 90 days supply | Qty: 270 | Fill #0

## 2020-04-17 DIAGNOSIS — Z944 Liver transplant status: Principal | ICD-10-CM

## 2020-04-17 DIAGNOSIS — Z79899 Other long term (current) drug therapy: Principal | ICD-10-CM

## 2020-04-18 ENCOUNTER — Other Ambulatory Visit (HOSPITAL_COMMUNITY): Payer: Self-pay

## 2020-04-18 DIAGNOSIS — Z944 Liver transplant status: Principal | ICD-10-CM

## 2020-04-18 DIAGNOSIS — E559 Vitamin D deficiency, unspecified: Principal | ICD-10-CM

## 2020-04-18 MED ORDER — ERGOCALCIFEROL (VITAMIN D2) 1,250 MCG (50,000 UNIT) CAPSULE
ORAL_CAPSULE | ORAL | 0 refills | 28 days | Status: CP
Start: 2020-04-18 — End: ?

## 2020-04-18 MED FILL — VIT D2 1.25 MG (50,000 UNIT: 1.25 MG | 28 days supply | Qty: 4 | Fill #0

## 2020-04-19 ENCOUNTER — Other Ambulatory Visit: Payer: Self-pay

## 2020-04-19 DIAGNOSIS — Z944 Liver transplant status: Principal | ICD-10-CM

## 2020-04-19 DIAGNOSIS — Z79899 Other long term (current) drug therapy: Principal | ICD-10-CM

## 2020-04-19 DIAGNOSIS — G8929 Other chronic pain: Secondary | ICD-10-CM | POA: Insufficient documentation

## 2020-04-19 DIAGNOSIS — I10 Essential (primary) hypertension: Secondary | ICD-10-CM | POA: Insufficient documentation

## 2020-04-19 DIAGNOSIS — M545 Low back pain, unspecified: Secondary | ICD-10-CM | POA: Insufficient documentation

## 2020-04-19 DIAGNOSIS — Z973 Presence of spectacles and contact lenses: Secondary | ICD-10-CM | POA: Insufficient documentation

## 2020-04-19 DIAGNOSIS — R32 Unspecified urinary incontinence: Secondary | ICD-10-CM | POA: Insufficient documentation

## 2020-04-20 ENCOUNTER — Encounter: Payer: Self-pay | Admitting: Cardiology

## 2020-04-20 ENCOUNTER — Ambulatory Visit: Payer: 59 | Admitting: Cardiology

## 2020-04-20 ENCOUNTER — Other Ambulatory Visit: Payer: Self-pay

## 2020-04-20 VITALS — BP 150/74 | HR 82 | Wt 148.0 lb

## 2020-04-20 DIAGNOSIS — I2510 Atherosclerotic heart disease of native coronary artery without angina pectoris: Secondary | ICD-10-CM

## 2020-04-20 DIAGNOSIS — E7849 Other hyperlipidemia: Secondary | ICD-10-CM

## 2020-04-20 DIAGNOSIS — Z944 Liver transplant status: Secondary | ICD-10-CM | POA: Diagnosis not present

## 2020-04-20 DIAGNOSIS — Z951 Presence of aortocoronary bypass graft: Secondary | ICD-10-CM | POA: Diagnosis not present

## 2020-04-20 DIAGNOSIS — I5030 Unspecified diastolic (congestive) heart failure: Secondary | ICD-10-CM | POA: Diagnosis not present

## 2020-04-20 MED FILL — MYCOPHENOLATE SODIUM 180 MG: 180 | 30 days supply | Qty: 60 | Fill #0

## 2020-04-20 NOTE — Progress Notes (Signed)
Cardiology Office Note:    Date:  04/20/2020   ID:  Christine Butler, DOB [birth year], MRN [id number]  PCP:  Maude Leriche, PA-C  Cardiologist:  [name], MD   Referring MD: Maude Leriche, PA-C    ASSESSMENT:    1. Coronary artery disease involving native coronary artery of native heart without angina pectoris   2. Familial hyperlipidemia   3. Heart failure with preserved ejection fraction, unspecified HF chronicity ([place])   4. S/P CABG (coronary artery bypass graft)   5. Status post liver transplantation ([place])    PLAN:    In order of problems listed above:  1. Coronary artery disease: Secondary prevention stressed to the patient.  Importance of compliance with diet medication stressed and vocalized understanding.  I told her to walk at least half an hour on a daily basis and she promises to do so. 2. Essential hypertension: Blood pressure stable.  Post transplant is mildly elevated.  Her blood pressure is 130/80 at home consistently unsteady.  Lifestyle modification and diet was emphasized. 3. Mixed dyslipidemia: Follow-up with the lipid clinic.  Diet was emphasized.  Her lipid doctors are following this very closely.  Her liver team has told her that it is fine to go on statins and she is waiting to get an appointment next with the lipid clinic. 4. Post liver transplant: Patient has been told that she is stable and her liver numbers are fine. 5. Patient will be seen in follow-up appointment in 6 months or earlier if the patient has any concerns    Medication Adjustments/Labs and Tests Ordered: Current medicines are reviewed at length with the patient today.  Concerns regarding medicines are outlined above.  No orders of the defined types were placed in this encounter.  No orders of the defined types were placed in this encounter.    No chief complaint on file.    History of Present Illness:    Christine Butler is a 50 y.o. female.  Patient has past medical  history of coronary artery disease post CABG surgery, hepatic failure and currently post liver transplant and dyslipidemia.  She denies any problems at this time and takes care of activities of daily living.  No chest pain orthopnea or PND.  At the time of my evaluation, the patient is alert awake oriented and in no distress.  She is very happy that she has the transplant and tells me that her quality of life is better.  She is much more active now.  Past Medical History:  Diagnosis Date  . (HFpEF) heart failure with preserved ejection fraction ([place]) 08/09/2019  . Acute blood loss anemia 04/20/2017  . Altered mental status 08/14/2019  . Arthritis    right knee  . Ascites--mild this admit 11/06/2017  . Atypical chest pain 11/05/2017  . Atypical squamous cells of undetermined significance (ASCUS) on Papanicolaou smear of cervix 08/05/2018  . CAD (coronary artery disease)    a. s/p CABGx2 in 02/2017 (LAD not suitable for PCI), EF normal.  . CAD (coronary artery disease), native coronary artery 03/23/2017  . 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 Chronic low back pain   . Cirrhosis ([place]) 03/16/2017  . Coronary artery disease 03/24/2017  . Current episode of major depressive disorder without prior episode 03/13/2020  . Dyslipidemia, goal LDL below 70 10/13/2017  . Elevated LFTs 04/20/2017  . Encephalopathy 09/29/2019   hepatic encephalopathy  . Familial hyperlipidemia   . GERD (gastroesophageal reflux disease)   .  GI bleed 02/01/2018  . H/O LEEP 03/30/2019  . H/O two vessel coronary artery bypass graft 03/30/2019  . High grade squamous intraepithelial lesion (HGSIL) on cytologic smear of cervix 05/04/2019  . Hypertension   . Hypo-osmolality and hyponatremia 03/30/2019  . IBS (irritable bowel syndrome)   . Other insomnia 09/05/2019  . Pancytopenia ([place]) 09/28/2019  . PONV (postoperative nausea and vomiting)    i always throw up on waking up , but last EGD in march had no issues    . PONV  (postoperative nausea and vomiting)    likes zofran and steroid to help does not like scopolamine patch  . Port-A-Cath in place 11/21/2019  . 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 IMOUPDATE  . Primary biliary cirrhosis (HCC)    cirhosis/liver disease followed by transplant team led by Dr Manuella Ghazi at [facility]   . S/P CABG (coronary artery bypass graft)   . S/P CABG x 2 03/24/2017   LIMA to DIAGONAL Portion of SVG/LEFT RADIAL to LAD  . S/P LEEP (loop electrosurgical excision procedure) 05/11/2019  . Status post liver transplantation ([place]) 03/30/2019  . SVD (spontaneous vaginal delivery)    x 3  . Upper GI bleed 02/01/2018  . Urinary incontinence    occ  . Urinary tract bacterial infections last oct 2021  . Wears glasses     Past Surgical History:  Procedure Laterality Date  . CERVICAL CONIZATION W/BX N/A 03/22/2020   Procedure: CONIZATION CERVIX WITH BIOPSY;  Surgeon: Dian Queen, MD;  Location: [place];  Service: Gynecology;  Laterality: N/A;  . CHOLECYSTECTOMY  09/2019  . COLPOSCOPY N/A 03/22/2020   Procedure: COLPOSCOPY;  Surgeon: Dian Queen, MD;  [facility];  Service: Gynecology;  Laterality: N/A;  . CORONARY ARTERY BYPASS GRAFT N/A 03/24/2017   Procedure: CORONARY ARTERY BYPASS GRAFTING (CABG) x two, using left internal mammary artery, left radial artery, and right leg greater saphenous vein harvested endoscopically;  Surgeon: Ivin Poot, MD;  Location: [place];  Service: Open Heart Surgery;  Laterality: N/A;  . ENDOVEIN HARVEST OF GREATER SAPHENOUS VEIN Right 03/24/2017   Procedure: ENDOVEIN HARVEST OF GREATER SAPHENOUS VEIN;  Surgeon: Ivin Poot, MD;  Location: [place];  Service: Open Heart Surgery;  Laterality: Right;  . ESOPHAGEAL BANDING  02/01/2018   Procedure: ESOPHAGEAL BANDING;  Surgeon: [name], MD;  Location: [facility];  Service:  Gastroenterology;;  . ESOPHAGEAL BANDING N/A 03/29/2018   Procedure: ESOPHAGEAL BANDING;  Surgeon: [name], MD;  Location: [facility];  Service: Gastroenterology;  Laterality: N/A;  . ESOPHAGEAL BANDING N/A 05/21/2018   Procedure: ESOPHAGEAL BANDING;  Surgeon: [name], MD;  Location: [facility];  Service: Gastroenterology;  Laterality: N/A;  . ESOPHAGEAL BANDING N/A 10/11/2018   Procedure: ESOPHAGEAL BANDING;  Surgeon: [name], MD;  Location: [facility];  Service: Gastroenterology;  Laterality: N/A;  . ESOPHAGOGASTRODUODENOSCOPY N/A 03/29/2018   Procedure: ESOPHAGOGASTRODUODENOSCOPY (EGD);  Surgeon: [name], MD;  Location: [facility];  Service: Gastroenterology;  Laterality: N/A;  . ESOPHAGOGASTRODUODENOSCOPY N/A 05/21/2018   Procedure: ESOPHAGOGASTRODUODENOSCOPY (EGD);  Surgeon: [name], MD;  Location: [facility];  Service: Gastroenterology;  Laterality: N/A;  . ESOPHAGOGASTRODUODENOSCOPY (EGD) WITH PROPOFOL N/A 02/01/2018   Procedure: ESOPHAGOGASTRODUODENOSCOPY (EGD) WITH PROPOFOL;  Surgeon: [name], MD;  Location: [facility];  Service: Gastroenterology;  Laterality: N/A;  . ESOPHAGOGASTRODUODENOSCOPY (EGD) WITH PROPOFOL N/A 10/11/2018   Procedure: ESOPHAGOGASTRODUODENOSCOPY (EGD) WITH PROPOFOL;  Surgeon: [name], MD;  Location: [facility];  Service: Gastroenterology;  Laterality: N/A;  . INCONTINENCE SURGERY  2009   urinary  bladder sling  . IR IMAGING GUIDED PORT INSERTION  04/08/2019  . LEEP N/A 03/28/2014   Procedure: LOOP ELECTROSURGICAL EXCISION PROCEDURE (LEEP) cone biopsy;  Surgeon: Cyril Mourning, MD;  Location: [facility];  Service: Gynecology;  Laterality: N/A;  . LEEP N/A 03/22/2020   Procedure: LOOP ELECTROSURGICAL EXCISION PROCEDURE (LEEP);  Surgeon: Dian Queen, MD;  Location: [facility];  Service: Gynecology;  Laterality: N/A;  . LEFT HEART CATH AND CORONARY ANGIOGRAPHY N/A 03/23/2017   Procedure: LEFT HEART CATH AND CORONARY  ANGIOGRAPHY;  Surgeon: Jettie Booze, MD;  Location: [place] [facility];  Service: Cardiovascular;  Laterality: N/A;  . LIVER BIOPSY     x 2  . LIVER TRANSPLANT  08/ 24/2021  . RADIAL ARTERY HARVEST Left 03/24/2017   Procedure: RADIAL ARTERY HARVEST;  Surgeon: Ivin Poot, MD;  Location: [place];  Service: Open Heart Surgery;  Laterality: Left;  . TEE WITHOUT CARDIOVERSION N/A 03/24/2017   Procedure: TRANSESOPHAGEAL ECHOCARDIOGRAM (TEE);  Surgeon: Prescott Gum, Collier Salina, MD;  Location: [place];  Service: Open Heart Surgery;  Laterality: N/A;  . WISDOM TOOTH EXTRACTION  yrs ago    Current Medications: Current Meds  Medication Sig  . acetaminophen (TYLENOL) 500 MG tablet Take 1,000 mg by mouth every 8 (eight) hours as needed for mild pain.  . ASPIRIN LOW DOSE 81 MG EC tablet Take 81 mg by mouth daily.  . Biotin 5 MG CAPS Take 5 mg by mouth daily in the afternoon.  Marland Kitchen CALCIUM PO Take 2,000 mg by mouth in the morning and at bedtime.  . carvedilol (COREG) 25 MG tablet Take 25 mg by mouth 2 (two) times daily.   . cycloSPORINE (RESTASIS) 0.05 % ophthalmic emulsion Place 1 drop into both eyes at bedtime.   . Emollient (COLLAGEN EX) Apply 1 application topically at bedtime. 3300 mg per serving per day tablet  . ENVARSUS XR 4 MG TB24 Take 2 tablets by mouth daily.  . ergocalciferol (VITAMIN D2) 1.25 MG (50000 UT) capsule Take 50,000 Units by mouth once a week.  . finasteride (PROSCAR) 5 MG tablet Take 2.5 mg by mouth daily in the afternoon.  . fluticasone (FLONASE) 50 MCG/ACT nasal spray Place 1 spray into both nostrils daily as needed for allergies or rhinitis.   . mirtazapine (REMERON) 30 MG tablet Take 30 mg by mouth at bedtime.  . Multiple Vitamins-Minerals (DEKAS PLUS) CAPS Take 1 tablet by mouth daily.  . nitroGLYCERIN (NITROSTAT) 0.4 MG SL tablet Place 0.4 mg under the tongue every 5 (five) minutes as needed for chest pain.  Marland Kitchen omeprazole (PRILOSEC) 40 MG capsule Take 40 mg by mouth daily.  .  ondansetron (ZOFRAN-ODT) 8 MG disintegrating tablet Take 8 mg by mouth 3 (three) times daily as needed for vomiting or nausea.  . predniSONE (DELTASONE) 5 MG tablet Take 5 mg by mouth daily.  Marland Kitchen Specialty Vitamins Products (MAGNESIUM, AMINO ACID CHELATE,) 133 MG tablet Take 2 tablets by mouth daily.  . ursodiol (ACTIGALL) 300 MG capsule Take 300 mg by mouth 3 (three) times daily.     Allergies:   Codeine, Erythromycin, and Fentanyl   Social History   Socioeconomic History  . Marital status: Divorced    Spouse name: Not on file  . Number of children: 3  . Years of education: 43  . Highest education  level: Associate degree: academic program  Occupational History  . Not on file  Tobacco Use  . Smoking status: Never Smoker  . Smokeless tobacco: Never Used  Vaping Use  . Vaping Use: Never used  Substance and Sexual Activity  . Alcohol use: Not Currently    Alcohol/week: 1.0 standard drink    Types: 1 Glasses of wine per week  . Drug use: No  . Sexual activity: Yes    Partners: Male    Birth control/protection: None  Other Topics Concern  . Not on file  Social History Narrative  . Not on file   Social Determinants of Health   Financial Resource Strain: Not on file  Food Insecurity: Not on file  Transportation Needs: Not on file  Physical Activity: Not on file  Stress: Not on file  Social Connections: Not on file     Family History: The patient's family history includes CAD in her father; Diabetes Mellitus II in her father; Heart attack in her brother, paternal grandfather, and paternal grandmother; Heart disease in her father and maternal aunt.  ROS:   Please see the history of present illness.    All other systems reviewed and are negative.  EKGs/Labs/Other Studies Reviewed:    The following studies were reviewed today: I discussed my findings with the patient at length.  Her lipids are followed by our lipid clinic.   Recent Labs: 09/19/2019: B Natriuretic Peptide  122.5 10/11/2019: ALT 43; Platelets 129; TSH 2.360 03/22/2020: BUN 34; Creatinine, Ser 1.00; Hemoglobin 10.5; Potassium 5.4; Sodium 139  Recent Lipid Panel    Component Value Date/Time   CHOL 137 12/29/2019 0822   TRIG 190 (H) 12/29/2019 0822   HDL 41 12/29/2019 0822   CHOLHDL 3.3 12/29/2019 0822   CHOLHDL NOT CALCULATED 12/18/2017 0720   VLDL 38 12/18/2017 0720   LDLCALC 64 12/29/2019 0822   LDLDIRECT 229 (H) 12/30/2017 0856    Physical Exam:    VS:  BP (!) 150/74   Pulse 82   Wt 148 lb (67.1 kg)   LMP 05/10/2018 (Approximate)   SpO2 99%   BMI 27.07 kg/m     Wt Readings from Last 3 Encounters:  04/20/20 148 lb (67.1 kg)  03/22/20 133 lb 3.2 oz (60.4 kg)  01/11/20 116 lb (52.6 kg)     GEN: Patient is in no acute distress HEENT: Normal NECK: No JVD; No carotid bruits LYMPHATICS: No lymphadenopathy CARDIAC: Hear sounds regular, 2/6 systolic murmur at the apex. RESPIRATORY:  Clear to auscultation without rales, wheezing or rhonchi  ABDOMEN: Soft, non-tender, non-distended MUSCULOSKELETAL:  No edema; No deformity  SKIN: Warm and dry NEUROLOGIC:  Alert and oriented x 3 PSYCHIATRIC:  Normal affect   Signed, [name], MD  04/20/2020 3:24 PM    [facility]

## 2020-04-20 NOTE — Patient Instructions (Signed)

## 2020-04-23 DIAGNOSIS — Z944 Liver transplant status: Principal | ICD-10-CM

## 2020-04-23 DIAGNOSIS — Z79899 Other long term (current) drug therapy: Principal | ICD-10-CM

## 2020-04-24 DIAGNOSIS — Z944 Liver transplant status: Principal | ICD-10-CM

## 2020-04-24 DIAGNOSIS — Z79899 Other long term (current) drug therapy: Principal | ICD-10-CM

## 2020-04-26 DIAGNOSIS — Z79899 Other long term (current) drug therapy: Principal | ICD-10-CM

## 2020-04-26 DIAGNOSIS — Z944 Liver transplant status: Principal | ICD-10-CM

## 2020-04-30 DIAGNOSIS — Z944 Liver transplant status: Principal | ICD-10-CM

## 2020-04-30 DIAGNOSIS — Z79899 Other long term (current) drug therapy: Principal | ICD-10-CM

## 2020-05-01 DIAGNOSIS — Z944 Liver transplant status: Principal | ICD-10-CM

## 2020-05-01 DIAGNOSIS — Z79899 Other long term (current) drug therapy: Principal | ICD-10-CM

## 2020-05-02 DIAGNOSIS — Z79899 Other long term (current) drug therapy: Secondary | ICD-10-CM | POA: Diagnosis not present

## 2020-05-02 DIAGNOSIS — Z944 Liver transplant status: Secondary | ICD-10-CM | POA: Diagnosis not present

## 2020-05-02 MED FILL — ENVARSUS XR 4 MG TB24: 4 | 30 days supply | Qty: 60 | Fill #2

## 2020-05-03 DIAGNOSIS — Z944 Liver transplant status: Principal | ICD-10-CM

## 2020-05-03 DIAGNOSIS — Z79899 Other long term (current) drug therapy: Principal | ICD-10-CM

## 2020-05-06 MED FILL — OMEPRAZOLE 40 MG CPDR: 40 | 30 days supply | Qty: 30 | Fill #2

## 2020-05-06 MED FILL — CARVEDILOL 25 MG TABS: 25 | 30 days supply | Qty: 90 | Fill #3

## 2020-05-07 DIAGNOSIS — Z944 Liver transplant status: Principal | ICD-10-CM

## 2020-05-07 DIAGNOSIS — Z79899 Other long term (current) drug therapy: Principal | ICD-10-CM

## 2020-05-08 DIAGNOSIS — Z944 Liver transplant status: Principal | ICD-10-CM

## 2020-05-08 DIAGNOSIS — Z79899 Other long term (current) drug therapy: Principal | ICD-10-CM

## 2020-05-10 DIAGNOSIS — Z944 Liver transplant status: Principal | ICD-10-CM

## 2020-05-10 DIAGNOSIS — Z79899 Other long term (current) drug therapy: Principal | ICD-10-CM

## 2020-05-14 DIAGNOSIS — Z79899 Other long term (current) drug therapy: Principal | ICD-10-CM

## 2020-05-14 DIAGNOSIS — Z944 Liver transplant status: Principal | ICD-10-CM

## 2020-05-15 DIAGNOSIS — Z79899 Other long term (current) drug therapy: Principal | ICD-10-CM

## 2020-05-15 DIAGNOSIS — Z944 Liver transplant status: Principal | ICD-10-CM

## 2020-05-17 DIAGNOSIS — Z944 Liver transplant status: Principal | ICD-10-CM

## 2020-05-17 DIAGNOSIS — Z79899 Other long term (current) drug therapy: Principal | ICD-10-CM

## 2020-05-21 DIAGNOSIS — Z79899 Other long term (current) drug therapy: Principal | ICD-10-CM

## 2020-05-21 DIAGNOSIS — Z944 Liver transplant status: Principal | ICD-10-CM

## 2020-05-22 ENCOUNTER — Other Ambulatory Visit (HOSPITAL_COMMUNITY): Payer: Self-pay

## 2020-05-22 DIAGNOSIS — Z79899 Other long term (current) drug therapy: Principal | ICD-10-CM

## 2020-05-22 DIAGNOSIS — Z944 Liver transplant status: Principal | ICD-10-CM

## 2020-05-24 DIAGNOSIS — Z944 Liver transplant status: Principal | ICD-10-CM

## 2020-05-24 DIAGNOSIS — Z79899 Other long term (current) drug therapy: Principal | ICD-10-CM

## 2020-05-28 ENCOUNTER — Other Ambulatory Visit (HOSPITAL_COMMUNITY): Payer: Self-pay

## 2020-05-28 DIAGNOSIS — Z944 Liver transplant status: Principal | ICD-10-CM

## 2020-05-28 DIAGNOSIS — Z79899 Other long term (current) drug therapy: Principal | ICD-10-CM

## 2020-05-28 MED FILL — Tacrolimus Tab ER 24HR 4 MG: ORAL | 30 days supply | Qty: 60 | Fill #0 | Status: AC

## 2020-05-29 ENCOUNTER — Other Ambulatory Visit (HOSPITAL_COMMUNITY): Payer: Self-pay

## 2020-05-29 DIAGNOSIS — Z79899 Other long term (current) drug therapy: Principal | ICD-10-CM

## 2020-05-29 DIAGNOSIS — Z944 Liver transplant status: Principal | ICD-10-CM

## 2020-05-30 ENCOUNTER — Other Ambulatory Visit (HOSPITAL_COMMUNITY): Payer: Self-pay

## 2020-05-31 DIAGNOSIS — Z944 Liver transplant status: Principal | ICD-10-CM

## 2020-05-31 DIAGNOSIS — Z79899 Other long term (current) drug therapy: Principal | ICD-10-CM

## 2020-06-04 DIAGNOSIS — Z944 Liver transplant status: Principal | ICD-10-CM

## 2020-06-04 DIAGNOSIS — Z79899 Other long term (current) drug therapy: Principal | ICD-10-CM

## 2020-06-04 LAB — BASIC METABOLIC PANEL: Creatinine: 1.1 (ref 0.5–1.1)

## 2020-06-04 LAB — COMPREHENSIVE METABOLIC PANEL: Calcium: 8.8 (ref 8.7–10.7)

## 2020-06-05 DIAGNOSIS — Z79899 Other long term (current) drug therapy: Principal | ICD-10-CM

## 2020-06-05 DIAGNOSIS — Z944 Liver transplant status: Principal | ICD-10-CM

## 2020-06-06 ENCOUNTER — Telehealth: Payer: Self-pay | Admitting: Endocrinology

## 2020-06-06 NOTE — Telephone Encounter (Signed)
Patient has called and would to schedule next [facility] - missed 01/2020 due to liver transplant - call back # [phone]

## 2020-06-06 NOTE — Telephone Encounter (Signed)
Will need another BMP

## 2020-06-06 NOTE — Telephone Encounter (Signed)
Patient reports that she has blood work done ever other Monday and how blood work done this past week.  She will call to get the results sent to Dr. Dwyane Dee.   Tentative appointment made for 06/21/20 at [facility].  She reports also that she has a port and was wondering if we can do it through the port in stead of another IV.  I told her that I would discuss this with Dr. Dwyane Dee and let her know.

## 2020-06-07 ENCOUNTER — Other Ambulatory Visit (HOSPITAL_COMMUNITY): Payer: Self-pay

## 2020-06-07 DIAGNOSIS — Z79899 Other long term (current) drug therapy: Principal | ICD-10-CM

## 2020-06-07 DIAGNOSIS — Z944 Liver transplant status: Principal | ICD-10-CM

## 2020-06-07 DIAGNOSIS — R208 Other disturbances of skin sensation: Principal | ICD-10-CM

## 2020-06-07 MED ORDER — GABAPENTIN 300 MG CAPSULE
ORAL_CAPSULE | Freq: Every evening | ORAL | 6 refills | 30 days | Status: CP
Start: 2020-06-07 — End: 2021-06-07

## 2020-06-07 MED ORDER — GABAPENTIN 300 MG PO CAPS
1.0000 | ORAL_CAPSULE | Freq: Every day | ORAL | 6 refills | Status: DC
  Filled 2020-06-07: qty 30, 30d supply, fill #0
  Filled 2020-07-02: qty 30, 30d supply, fill #1
  Filled 2020-08-12: qty 30, 30d supply, fill #2
  Filled 2020-09-10: qty 30, 30d supply, fill #3
  Filled 2020-10-09: qty 30, 30d supply, fill #4

## 2020-06-07 NOTE — Telephone Encounter (Signed)
Okay to do it through the port.  Need to wait till I see her in the office before doing this

## 2020-06-11 DIAGNOSIS — Z944 Liver transplant status: Principal | ICD-10-CM

## 2020-06-11 DIAGNOSIS — Z79899 Other long term (current) drug therapy: Principal | ICD-10-CM

## 2020-06-12 ENCOUNTER — Encounter: Payer: Self-pay | Admitting: Endocrinology

## 2020-06-12 DIAGNOSIS — Z944 Liver transplant status: Principal | ICD-10-CM

## 2020-06-12 DIAGNOSIS — Z79899 Other long term (current) drug therapy: Principal | ICD-10-CM

## 2020-06-12 NOTE — Telephone Encounter (Signed)
Okay 

## 2020-06-14 ENCOUNTER — Other Ambulatory Visit: Payer: Self-pay

## 2020-06-14 ENCOUNTER — Encounter: Payer: Self-pay | Admitting: Endocrinology

## 2020-06-14 ENCOUNTER — Other Ambulatory Visit (HOSPITAL_COMMUNITY): Payer: Self-pay

## 2020-06-14 ENCOUNTER — Ambulatory Visit: Payer: 59 | Admitting: Endocrinology

## 2020-06-14 VITALS — BP 130/80 | HR 79 | Ht 60.5 in | Wt 160.2 lb

## 2020-06-14 DIAGNOSIS — Z944 Liver transplant status: Principal | ICD-10-CM

## 2020-06-14 DIAGNOSIS — Z79899 Other long term (current) drug therapy: Principal | ICD-10-CM

## 2020-06-14 DIAGNOSIS — E559 Vitamin D deficiency, unspecified: Secondary | ICD-10-CM

## 2020-06-14 DIAGNOSIS — M818 Other osteoporosis without current pathological fracture: Secondary | ICD-10-CM | POA: Diagnosis not present

## 2020-06-14 MED ORDER — ERGOCALCIFEROL 1.25 MG (50000 UT) PO CAPS
50000.0000 [IU] | ORAL_CAPSULE | ORAL | 0 refills | Status: DC
  Filled 2020-06-14: qty 12, 84d supply, fill #0

## 2020-06-14 NOTE — Progress Notes (Signed)
Patient ID: Christine Butler, female   DOB: 27-Jun-1970, 50 y.o.   MRN: [id number]             Chief complaint: Follow-up of osteoporosis  History of Present Illness:   She had a screening bone density done in 02/2018 because of her chronic liver disease and since she is a candidate for liver transplant Results of the bone density were as follows:  Spine: The Z score is -2.4 and the T score is -3.0. This value is below the fracture risk threshold.  The total bone mineral density in the proximal left femur measures 0.667 gm/cm2. The Z score is -1.9 and the T score is -2.3. This value is below the fracture risk threshold.  The femoral neck density is 0.532 gm/cm2, and the T score is -2.9. The other T scores range from -2.4 to -1.6.    Previous height 5 feet 1-1/2 inches, today's height is about 1 inch less  She has no history of low trauma fracture Mild L5 compression deformity noticed on MRI in 02/2018, unchanged from prior, asymptomatic  ?  Menopause: She has had mild hot flashes, low menstrual cycles for 2 years Has not had a hysterectomy and gynecologist has not recommended HRT  Lab Results  Component Value Date   [place] <0.3 10/11/2019    RECLAST treatment: 12/13/18 She did have mild body aches for couple of days with this  She is overdue for follow-up treatment  Calcium supplements: 500 mg twice daily, using chewable calcium  Vitamin D supplements: She took 50,000 units vitamin D weekly for a month from [facility] when her vitamin D level was about 21 No follow-up available    LABS:  Last calcium level 8.7 with albumin 3.5 on 02/08/19  Baseline vitamin D level done at [facility]: On 11/29/2026 was 21.5  Lab Results  Component Value Date   CREATININE 1.1 06/04/2020   CREATININE 1.00 03/22/2020   CREATININE 1.24 (H) 10/11/2019     No results found for: VD25OH   Past Medical History:  Diagnosis Date  . (HFpEF) heart failure with preserved ejection fraction ([place])  08/09/2019  . Acute blood loss anemia 04/20/2017  . Altered mental status 08/14/2019  . Arthritis    right knee  . Ascites--mild this admit 11/06/2017  . Atypical chest pain 11/05/2017  . Atypical squamous cells of undetermined significance (ASCUS) on Papanicolaou smear of cervix 08/05/2018  . CAD (coronary artery disease)    a. s/p CABGx2 in 02/2017 (LAD not suitable for PCI), EF normal.  . CAD (coronary artery disease), native coronary artery 03/23/2017  . 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 Chronic low back pain   . Cirrhosis ([place]) 03/16/2017  . Coronary artery disease 03/24/2017  . Current episode of major depressive disorder without prior episode 03/13/2020  . Dyslipidemia, goal LDL below 70 10/13/2017  . Elevated LFTs 04/20/2017  . Encephalopathy 09/29/2019   hepatic encephalopathy  . Familial hyperlipidemia   . GERD (gastroesophageal reflux disease)   . GI bleed 02/01/2018  . H/O LEEP 03/30/2019  . H/O two vessel coronary artery bypass graft 03/30/2019  . High grade squamous intraepithelial lesion (HGSIL) on cytologic smear of cervix 05/04/2019  . Hypertension   . Hypo-osmolality and hyponatremia 03/30/2019  . IBS (irritable bowel syndrome)   . Other insomnia 09/05/2019  . Pancytopenia ([place]) 09/28/2019  . PONV (postoperative nausea and vomiting)    i always throw up on waking up , but last EGD in  march had no issues    . PONV (postoperative nausea and vomiting)    likes zofran and steroid to help does not like scopolamine patch  . Port-A-Cath in place 11/21/2019  . 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 IMOUPDATE  . Primary biliary cirrhosis (HCC)    cirhosis/liver disease followed by transplant team led by Dr Manuella Ghazi at [facility]   . S/P CABG (coronary artery bypass graft)   . S/P CABG x 2 03/24/2017   LIMA to DIAGONAL Portion of SVG/LEFT RADIAL to LAD  . S/P LEEP (loop electrosurgical excision procedure)  05/11/2019  . Status post liver transplantation ([place]) 03/30/2019  . SVD (spontaneous vaginal delivery)    x 3  . Upper GI bleed 02/01/2018  . Urinary incontinence    occ  . Urinary tract bacterial infections last oct 2021  . Wears glasses     Past Surgical History:  Procedure Laterality Date  . CERVICAL CONIZATION W/BX N/A 03/22/2020   Procedure: CONIZATION CERVIX WITH BIOPSY;  Surgeon: Dian Queen, MD;  Location: [place];  Service: Gynecology;  Laterality: N/A;  . CHOLECYSTECTOMY  09/2019  . COLPOSCOPY N/A 03/22/2020   Procedure: COLPOSCOPY;  Surgeon: Dian Queen, MD;  Location[facility];  Service: Gynecology;  Laterality: N/A;  . CORONARY ARTERY BYPASS GRAFT N/A 03/24/2017   Procedure: CORONARY ARTERY BYPASS GRAFTING (CABG) x two, using left internal mammary artery, left radial artery, and right leg greater saphenous vein harvested endoscopically;  Surgeon: Ivin Poot, MD;  Location: [place];  Service: Open Heart Surgery;  Laterality: N/A;  . ENDOVEIN HARVEST OF GREATER SAPHENOUS VEIN Right 03/24/2017   Procedure: ENDOVEIN HARVEST OF GREATER SAPHENOUS VEIN;  Surgeon: Ivin Poot, MD;  Location: [place];  Service: Open Heart Surgery;  Laterality: Right;  . ESOPHAGEAL BANDING  02/01/2018   Procedure: ESOPHAGEAL BANDING;  Surgeon: [name], MD;  Location: [facility];  Service: Gastroenterology;;  . ESOPHAGEAL BANDING N/A 03/29/2018   Procedure: ESOPHAGEAL BANDING;  Surgeon: [name], MD;  Location: [facility];  Service: Gastroenterology;  Laterality: N/A;  . ESOPHAGEAL BANDING N/A 05/21/2018   Procedure: ESOPHAGEAL BANDING;  Surgeon: [name], MD;  Location: [facility];  Service: Gastroenterology;  Laterality: N/A;  . ESOPHAGEAL BANDING N/A 10/11/2018   Procedure: ESOPHAGEAL BANDING;  Surgeon: [name], MD;  Location: [facility];  Service: Gastroenterology;  Laterality: N/A;  . ESOPHAGOGASTRODUODENOSCOPY N/A 03/29/2018    Procedure: ESOPHAGOGASTRODUODENOSCOPY (EGD);  Surgeon: [name], MD;  Location: [facility];  Service: Gastroenterology;  Laterality: N/A;  . ESOPHAGOGASTRODUODENOSCOPY N/A 05/21/2018   Procedure: ESOPHAGOGASTRODUODENOSCOPY (EGD);  Surgeon: [name], MD;  Location: [facility];  Service: Gastroenterology;  Laterality: N/A;  . ESOPHAGOGASTRODUODENOSCOPY (EGD) WITH PROPOFOL N/A 02/01/2018   Procedure: ESOPHAGOGASTRODUODENOSCOPY (EGD) WITH PROPOFOL;  Surgeon: [name], MD;  Location: [facility];  Service: Gastroenterology;  Laterality: N/A;  . ESOPHAGOGASTRODUODENOSCOPY (EGD) WITH PROPOFOL N/A 10/11/2018   Procedure: ESOPHAGOGASTRODUODENOSCOPY (EGD) WITH PROPOFOL;  Surgeon: [name], MD;  Location: [facility];  Service: Gastroenterology;  Laterality: N/A;  . INCONTINENCE SURGERY  2009   urinary  bladder sling  . IR IMAGING GUIDED PORT INSERTION  04/08/2019  . LEEP N/A 03/28/2014   Procedure: LOOP ELECTROSURGICAL EXCISION PROCEDURE (LEEP) cone biopsy;  Surgeon: Cyril Mourning, MD;  Location: [facility];  Service: Gynecology;  Laterality: N/A;  . LEEP N/A 03/22/2020   Procedure: LOOP ELECTROSURGICAL EXCISION PROCEDURE (LEEP);  Surgeon:  Dian Queen, MD;  Location[facility]  Service: Gynecology;  Laterality: N/A;  . LEFT HEART CATH AND CORONARY ANGIOGRAPHY N/A 03/23/2017   Procedure: LEFT HEART CATH AND CORONARY ANGIOGRAPHY;  Surgeon: Jettie Booze, MD;  Location: [place] [facility];  Service: Cardiovascular;  Laterality: N/A;  . LIVER BIOPSY     x 2  . LIVER TRANSPLANT  08/ 24/2021  . RADIAL ARTERY HARVEST Left 03/24/2017   Procedure: RADIAL ARTERY HARVEST;  Surgeon: Ivin Poot, MD;  Location: [place];  Service: Open Heart Surgery;  Laterality: Left;  . TEE WITHOUT CARDIOVERSION N/A 03/24/2017   Procedure: TRANSESOPHAGEAL ECHOCARDIOGRAM (TEE);  Surgeon: Prescott Gum, Collier Salina, MD;  Location: [place];  Service: Open Heart Surgery;  Laterality: N/A;  . WISDOM TOOTH  EXTRACTION  yrs ago    Family History  Problem Relation Age of Onset  . CAD Father   . Diabetes Mellitus II Father   . Heart disease Father   . Heart attack Brother   . Heart disease Maternal Aunt   . Heart attack Paternal Grandmother   . Heart attack Paternal Grandfather     Social History:  reports that she has never smoked. She has never used smokeless tobacco. She reports previous alcohol use of about 1.0 standard drink of alcohol per week. She reports that she does not use drugs.  Allergies:  Allergies  Allergen Reactions  . Codeine Nausea And Vomiting  . Erythromycin Nausea And Vomiting  . Fentanyl Nausea And Vomiting    Allergies as of 06/14/2020      Reactions   Codeine Nausea And Vomiting   Erythromycin Nausea And Vomiting   Fentanyl Nausea And Vomiting      Medication List       Accurate as of June 14, 2020  2:59 PM. If you have any questions, ask your nurse or doctor.        acetaminophen 500 MG tablet Commonly known as: TYLENOL Take 1,000 mg by mouth every 8 (eight) hours as needed for mild pain.   Aspirin Low Dose 81 MG EC tablet Generic drug: aspirin Take 81 mg by mouth daily.   Biotin 5 MG Caps Take 5 mg by mouth daily in the afternoon.   CALCIUM PO Take 2,000 mg by mouth in the morning and at bedtime.   carvedilol 25 MG tablet Commonly known as: COREG Take 25 mg by mouth 2 (two) times daily. What changed: Another medication with the same name was removed. Continue taking this medication, and follow the directions you see here. Changed by: Elayne Snare, MD   COLLAGEN EX Apply 1 application topically at bedtime. 3300 mg per serving per day tablet   DEKAs Plus Caps Take 1 tablet by mouth daily.   Envarsus XR 1 MG Tb24 Generic drug: Tacrolimus ER TAKE 2 TABLETS BY MOUTH DAILY. TAKE IN ADDITION TO 4 MG TABLET FOR TOTAL DOSE OF 10 MG DAILY   Envarsus XR 4 MG Tb24 Generic drug: Tacrolimus ER TAKE TWO TABLETS BY MOUTH ONCE DAILY IN ADDITION  TO 1 MG TABLETS FOR TOTAL DOSE OF 10 MG DAILY   Envarsus XR 4 MG Tb24 Generic drug: Tacrolimus ER TAKE 2 TABLETS (8 MG TOTAL) BY MOUTH DAILY.   Envarsus XR 4 MG Tb24 Generic drug: Tacrolimus ER Take 2 tablets by mouth daily.   ergocalciferol 1.25 MG (50000 UT) capsule Commonly known as: VITAMIN D2 Take 50,000 Units by mouth once a week.   Vitamin D (Ergocalciferol)  1.25 MG (50000 UNIT) Caps capsule Commonly known as: DRISDOL TAKE 1 CAPSULE BY MOUTH ONCE A WEEK   finasteride 5 MG tablet Commonly known as: PROSCAR TAKE 1/2 TABLET BY MOUTH DAILY What changed: Another medication with the same name was removed. Continue taking this medication, and follow the directions you see here. Changed by: Elayne Snare, MD   fluticasone 50 MCG/ACT nasal spray Commonly known as: FLONASE Place 1 spray into both nostrils daily as needed for allergies or rhinitis.   gabapentin 300 MG capsule Commonly known as: NEURONTIN Take 1 capsule by  mouth at bedtime (Take 1 capsule by  mouth at bedtime)   magnesium (amino acid chelate) 133 MG tablet Take 2 tablets by mouth daily.   mirtazapine 30 MG tablet Commonly known as: REMERON Take 30 mg by mouth at bedtime. What changed: Another medication with the same name was removed. Continue taking this medication, and follow the directions you see here. Changed by: Elayne Snare, MD   mycophenolate 180 MG EC tablet Commonly known as: MYFORTIC TAKE 1 TABLET BY MOUTH 2 TIMES DAILY   nitroGLYCERIN 0.4 MG SL tablet Commonly known as: NITROSTAT Place 0.4 mg under the tongue every 5 (five) minutes as needed for chest pain.   OLANZapine 5 MG tablet Commonly known as: ZYPREXA TAKE 1 TABLET BY MOUTH NIGHTLY   omeprazole 40 MG capsule Commonly known as: PRILOSEC Take 40 mg by mouth daily. What changed: Another medication with the same name was removed. Continue taking this medication, and follow the directions you see here. Changed by: Elayne Snare, MD    ondansetron 8 MG disintegrating tablet Commonly known as: ZOFRAN-ODT Take 8 mg by mouth 3 (three) times daily as needed for vomiting or nausea.   predniSONE 5 MG tablet Commonly known as: DELTASONE Take 5 mg by mouth daily.   predniSONE 5 MG tablet Commonly known as: DELTASONE TAKE 1 TABLET BY MOUTH DAILY.   Restasis 0.05 % ophthalmic emulsion Generic drug: cycloSPORINE Place 1 drop into both eyes at bedtime.   ursodiol 300 MG capsule Commonly known as: ACTIGALL Take 300 mg by mouth 3 (three) times daily.   ursodiol 300 MG capsule Commonly known as: ACTIGALL TAKE 1 CAPSULE BY MOUTH 3 TIMES DAILY   valGANciclovir 450 MG tablet Commonly known as: VALCYTE TAKE 1 TABLET (450 MG TOTAL) BY MOUTH DAILY.        Review of Systems  She has had primary biliary cirrhosis since 2006 and has had successful liver transplant  No history of thyroid disease  Lab Results  Component Value Date   TSH 2.360 10/11/2019   FREET4 1.60 10/11/2019     PHYSICAL EXAM:  BP 130/80   Pulse 79   Ht 5' 0.5" (1.537 m)   Wt 160 lb 3.2 oz (72.7 kg)   LMP 05/10/2018 (Approximate)   SpO2 99%   BMI 30.77 kg/m   No spinal deformity or prominent spines  ASSESSMENT:   OSTEOPOROSIS with lowest T score -3.0  Osteoporosis likely related to her severe liver disease She does have an old mild L5 compression deformity on MRI scan since 2019 at least  Again has a decreased vitamin D level of 21 and has taken vitamin D prescription only for 1 month Also has been in menopause for about 2 years  She has been scheduled to receive Reclast next week    PLAN:    Restart vitamin D 50,000 units weekly and have level checked again in a couple of months  Most likely will need to continue this long-term and possibly convert to OTC supplements  Reclast to be done next week    Elayne Snare 06/14/2020, 2:59 PM

## 2020-06-15 ENCOUNTER — Other Ambulatory Visit (HOSPITAL_COMMUNITY): Payer: Self-pay

## 2020-06-16 ENCOUNTER — Other Ambulatory Visit (HOSPITAL_COMMUNITY): Payer: Self-pay

## 2020-06-16 MED FILL — Prednisone Tab 5 MG: ORAL | 30 days supply | Qty: 30 | Fill #0 | Status: AC

## 2020-06-18 ENCOUNTER — Ambulatory Visit: Payer: 59

## 2020-06-18 ENCOUNTER — Other Ambulatory Visit (HOSPITAL_COMMUNITY): Payer: Self-pay

## 2020-06-18 DIAGNOSIS — Z944 Liver transplant status: Principal | ICD-10-CM

## 2020-06-18 DIAGNOSIS — Z79899 Other long term (current) drug therapy: Principal | ICD-10-CM

## 2020-06-19 ENCOUNTER — Other Ambulatory Visit (HOSPITAL_COMMUNITY): Payer: Self-pay

## 2020-06-19 DIAGNOSIS — Z944 Liver transplant status: Principal | ICD-10-CM

## 2020-06-19 DIAGNOSIS — Z79899 Other long term (current) drug therapy: Principal | ICD-10-CM

## 2020-06-21 ENCOUNTER — Other Ambulatory Visit (HOSPITAL_COMMUNITY): Payer: Self-pay

## 2020-06-21 DIAGNOSIS — Z79899 Other long term (current) drug therapy: Principal | ICD-10-CM

## 2020-06-21 DIAGNOSIS — Z944 Liver transplant status: Principal | ICD-10-CM

## 2020-06-21 MED FILL — Tacrolimus Tab ER 24HR 4 MG: ORAL | 30 days supply | Qty: 60 | Fill #1 | Status: AC

## 2020-06-25 ENCOUNTER — Other Ambulatory Visit (HOSPITAL_COMMUNITY): Payer: Self-pay

## 2020-06-25 ENCOUNTER — Telehealth: Payer: Self-pay | Admitting: Nutrition

## 2020-06-25 DIAGNOSIS — Z79899 Other long term (current) drug therapy: Principal | ICD-10-CM

## 2020-06-25 DIAGNOSIS — Z944 Liver transplant status: Principal | ICD-10-CM

## 2020-06-25 DIAGNOSIS — Z1231 Encounter for screening mammogram for malignant neoplasm of breast: Secondary | ICD-10-CM | POA: Diagnosis not present

## 2020-06-25 DIAGNOSIS — Z6830 Body mass index (BMI) 30.0-30.9, adult: Secondary | ICD-10-CM | POA: Diagnosis not present

## 2020-06-25 DIAGNOSIS — Z01419 Encounter for gynecological examination (general) (routine) without abnormal findings: Secondary | ICD-10-CM | POA: Diagnosis not present

## 2020-06-25 DIAGNOSIS — E785 Hyperlipidemia, unspecified: Secondary | ICD-10-CM | POA: Diagnosis not present

## 2020-06-25 LAB — LIPID PANEL
Chol/HDL Ratio: 3.1 ratio (ref 0.0–4.4)
Cholesterol, Total: 149 mg/dL (ref 100–199)
HDL: 48 mg/dL (ref >39)
LDL Chol Calc (NIH): 67 mg/dL (ref 0–99)
Triglycerides: 206 mg/dL — ABNORMAL HIGH (ref 0–149)
VLDL Cholesterol Cal: 34 mg/dL (ref 5–40)

## 2020-06-25 NOTE — Telephone Encounter (Signed)
Orders for Reclast infusion faxed to infusion center.  They do not schedule patient.  So the patient was called and number given to call to schedule this procedure.

## 2020-06-26 ENCOUNTER — Other Ambulatory Visit (HOSPITAL_COMMUNITY): Payer: Self-pay

## 2020-06-26 ENCOUNTER — Telehealth: Payer: Self-pay | Admitting: Nutrition

## 2020-06-26 DIAGNOSIS — T8649 Other complications of liver transplant: Principal | ICD-10-CM

## 2020-06-26 DIAGNOSIS — R7989 Other specified abnormal findings of blood chemistry: Principal | ICD-10-CM

## 2020-06-26 DIAGNOSIS — Z944 Liver transplant status: Principal | ICD-10-CM

## 2020-06-26 DIAGNOSIS — K831 Obstruction of bile duct: Principal | ICD-10-CM

## 2020-06-26 DIAGNOSIS — Z79899 Other long term (current) drug therapy: Principal | ICD-10-CM

## 2020-06-26 DIAGNOSIS — Z01419 Encounter for gynecological examination (general) (routine) without abnormal findings: Secondary | ICD-10-CM | POA: Diagnosis not present

## 2020-06-26 MED ORDER — CARVEDILOL 25 MG TABLET
ORAL_TABLET | 5 refills | 0 days | Status: CP
Start: 2020-06-26 — End: ?

## 2020-06-26 MED ORDER — OMEPRAZOLE 40 MG CAPSULE,DELAYED RELEASE
ORAL_CAPSULE | 5 refills | 0 days | Status: CP
Start: 2020-06-26 — End: ?

## 2020-06-26 MED ORDER — CARVEDILOL 25 MG PO TABS
37.5000 mg | ORAL_TABLET | Freq: Two times a day (BID) | ORAL | 5 refills | Status: DC
  Filled 2020-06-26: qty 90, 30d supply, fill #0
  Filled 2020-10-06: qty 90, 30d supply, fill #1

## 2020-06-26 MED ORDER — OMEPRAZOLE 40 MG PO CPDR
1.0000 | DELAYED_RELEASE_CAPSULE | Freq: Every day | ORAL | 5 refills | Status: DC
  Filled 2020-06-26: qty 30, 30d supply, fill #0

## 2020-06-27 NOTE — Telephone Encounter (Signed)
Paperwork put on Christine Butler to be scan

## 2020-06-28 ENCOUNTER — Other Ambulatory Visit (HOSPITAL_COMMUNITY): Payer: Self-pay

## 2020-06-28 DIAGNOSIS — Z79899 Other long term (current) drug therapy: Principal | ICD-10-CM

## 2020-06-28 DIAGNOSIS — Z944 Liver transplant status: Principal | ICD-10-CM

## 2020-06-28 MED ORDER — ENVARSUS XR 4 MG TABLET,EXTENDED RELEASE
ORAL_TABLET | 3 refills | 0 days | Status: CP
Start: 2020-06-28 — End: ?

## 2020-06-28 MED ORDER — ENVARSUS XR 1 MG TABLET,EXTENDED RELEASE
ORAL_TABLET | 3 refills | 0 days | Status: CP
Start: 2020-06-28 — End: ?

## 2020-06-28 MED ORDER — ENVARSUS XR 1 MG PO TB24
ORAL_TABLET | ORAL | 3 refills | Status: DC
  Filled 2020-06-28: qty 90, 30d supply, fill #0

## 2020-06-28 MED ORDER — ENVARSUS XR 4 MG PO TB24: ORAL_TABLET | ORAL | 3 refills | Status: DC

## 2020-06-28 NOTE — Telephone Encounter (Signed)
lvm on after hours line for infusion center asking about reclast - will call again tomorrow FYI

## 2020-07-02 ENCOUNTER — Other Ambulatory Visit (HOSPITAL_COMMUNITY): Payer: Self-pay

## 2020-07-02 DIAGNOSIS — Z944 Liver transplant status: Principal | ICD-10-CM

## 2020-07-02 DIAGNOSIS — Z79899 Other long term (current) drug therapy: Principal | ICD-10-CM

## 2020-07-02 MED FILL — Finasteride Tab 5 MG: ORAL | 90 days supply | Qty: 45 | Fill #0 | Status: AC

## 2020-07-03 ENCOUNTER — Ambulatory Visit: Admit: 2020-07-03 | Discharge: 2020-07-04 | Payer: PRIVATE HEALTH INSURANCE

## 2020-07-03 DIAGNOSIS — Z79899 Other long term (current) drug therapy: Principal | ICD-10-CM

## 2020-07-03 DIAGNOSIS — Z944 Liver transplant status: Principal | ICD-10-CM

## 2020-07-03 DIAGNOSIS — J45909 Unspecified asthma, uncomplicated: Secondary | ICD-10-CM | POA: Diagnosis not present

## 2020-07-03 DIAGNOSIS — R7989 Other specified abnormal findings of blood chemistry: Secondary | ICD-10-CM | POA: Diagnosis not present

## 2020-07-03 DIAGNOSIS — K862 Cyst of pancreas: Secondary | ICD-10-CM | POA: Diagnosis not present

## 2020-07-03 DIAGNOSIS — T8649 Other complications of liver transplant: Secondary | ICD-10-CM | POA: Diagnosis not present

## 2020-07-03 DIAGNOSIS — K838 Other specified diseases of biliary tract: Secondary | ICD-10-CM | POA: Diagnosis not present

## 2020-07-03 DIAGNOSIS — K831 Obstruction of bile duct: Secondary | ICD-10-CM | POA: Diagnosis not present

## 2020-07-04 DIAGNOSIS — T8649 Other complications of liver transplant: Principal | ICD-10-CM

## 2020-07-04 DIAGNOSIS — K8050 Calculus of bile duct without cholangitis or cholecystitis without obstruction: Principal | ICD-10-CM

## 2020-07-04 DIAGNOSIS — T8640 Unspecified complication of liver transplant: Principal | ICD-10-CM

## 2020-07-04 DIAGNOSIS — Z944 Liver transplant status: Principal | ICD-10-CM

## 2020-07-04 DIAGNOSIS — K831 Obstruction of bile duct: Principal | ICD-10-CM

## 2020-07-05 DIAGNOSIS — B2790 Infectious mononucleosis, unspecified without complication: Principal | ICD-10-CM

## 2020-07-05 DIAGNOSIS — Z79899 Other long term (current) drug therapy: Principal | ICD-10-CM

## 2020-07-05 DIAGNOSIS — D47Z1 Post-transplant lymphoproliferative disorder (PTLD): Principal | ICD-10-CM

## 2020-07-05 DIAGNOSIS — Z944 Liver transplant status: Principal | ICD-10-CM

## 2020-07-09 ENCOUNTER — Other Ambulatory Visit: Payer: Self-pay

## 2020-07-09 ENCOUNTER — Encounter (HOSPITAL_COMMUNITY): Payer: Self-pay | Admitting: Emergency Medicine

## 2020-07-09 ENCOUNTER — Emergency Department (HOSPITAL_COMMUNITY)
Admission: EM | Admit: 2020-07-09 | Discharge: 2020-07-10 | Payer: 59 | Attending: Emergency Medicine | Admitting: Emergency Medicine

## 2020-07-09 ENCOUNTER — Other Ambulatory Visit (HOSPITAL_COMMUNITY): Payer: Self-pay

## 2020-07-09 DIAGNOSIS — Z944 Liver transplant status: Secondary | ICD-10-CM | POA: Diagnosis not present

## 2020-07-09 DIAGNOSIS — I2510 Atherosclerotic heart disease of native coronary artery without angina pectoris: Secondary | ICD-10-CM | POA: Diagnosis not present

## 2020-07-09 DIAGNOSIS — I10 Essential (primary) hypertension: Secondary | ICD-10-CM | POA: Insufficient documentation

## 2020-07-09 DIAGNOSIS — Z79899 Other long term (current) drug therapy: Secondary | ICD-10-CM | POA: Diagnosis not present

## 2020-07-09 DIAGNOSIS — Z7982 Long term (current) use of aspirin: Secondary | ICD-10-CM | POA: Insufficient documentation

## 2020-07-09 DIAGNOSIS — K8309 Other cholangitis: Secondary | ICD-10-CM | POA: Insufficient documentation

## 2020-07-09 DIAGNOSIS — R509 Fever, unspecified: Secondary | ICD-10-CM | POA: Diagnosis present

## 2020-07-09 DIAGNOSIS — Z951 Presence of aortocoronary bypass graft: Secondary | ICD-10-CM | POA: Diagnosis not present

## 2020-07-09 DIAGNOSIS — Z20822 Contact with and (suspected) exposure to covid-19: Secondary | ICD-10-CM | POA: Diagnosis not present

## 2020-07-09 LAB — COMPREHENSIVE METABOLIC PANEL
ALT: 146 U/L — ABNORMAL HIGH (ref 0–44)
AST: 224 U/L — ABNORMAL HIGH (ref 15–41)
Albumin: 3.8 g/dL (ref 3.5–5.0)
Alkaline Phosphatase: 276 U/L — ABNORMAL HIGH (ref 38–126)
Anion gap: 9 (ref 5–15)
BUN: 31 mg/dL — ABNORMAL HIGH (ref 6–20)
CO2: 18 mmol/L — ABNORMAL LOW (ref 22–32)
Calcium: 8.9 mg/dL (ref 8.9–10.3)
Chloride: 113 mmol/L — ABNORMAL HIGH (ref 98–111)
Creatinine, Ser: 1.2 mg/dL — ABNORMAL HIGH (ref 0.44–1.00)
GFR, Estimated: 55 mL/min — ABNORMAL LOW (ref >60)
Glucose, Bld: 113 mg/dL — ABNORMAL HIGH (ref 70–99)
Potassium: 4.6 mmol/L (ref 3.5–5.1)
Sodium: 140 mmol/L (ref 135–145)
Total Bilirubin: 3.2 mg/dL — ABNORMAL HIGH (ref 0.3–1.2)
Total Protein: 6.6 g/dL (ref 6.5–8.1)

## 2020-07-09 LAB — CBC WITH DIFFERENTIAL/PLATELET
Abs Immature Granulocytes: 0.01 10*3/uL (ref 0.00–0.07)
Basophils Absolute: 0 10*3/uL (ref 0.0–0.1)
Basophils Relative: 0 %
Eosinophils Absolute: 0 10*3/uL (ref 0.0–0.5)
Eosinophils Relative: 1 %
HCT: 31.2 % — ABNORMAL LOW (ref 36.0–46.0)
Hemoglobin: 11.1 g/dL — ABNORMAL LOW (ref 12.0–15.0)
Immature Granulocytes: 0 %
Lymphocytes Relative: 4 %
Lymphs Abs: 0.2 10*3/uL — ABNORMAL LOW (ref 0.7–4.0)
MCH: 33.5 pg (ref 26.0–34.0)
MCHC: 35.6 g/dL (ref 30.0–36.0)
MCV: 94.3 fL (ref 80.0–100.0)
Monocytes Absolute: 0.2 10*3/uL (ref 0.1–1.0)
Monocytes Relative: 4 %
Neutro Abs: 4.3 10*3/uL (ref 1.7–7.7)
Neutrophils Relative %: 91 %
Platelets: 109 10*3/uL — ABNORMAL LOW (ref 150–400)
RBC: 3.31 MIL/uL — ABNORMAL LOW (ref 3.87–5.11)
RDW: 12.1 % (ref 11.5–15.5)
WBC: 4.7 10*3/uL (ref 4.0–10.5)
nRBC: 0 % (ref 0.0–0.2)

## 2020-07-09 LAB — URINALYSIS, ROUTINE W REFLEX MICROSCOPIC
Bilirubin Urine: NEGATIVE
Glucose, UA: NEGATIVE mg/dL
Hgb urine dipstick: NEGATIVE
Ketones, ur: NEGATIVE mg/dL
Leukocytes,Ua: NEGATIVE
Nitrite: NEGATIVE
Protein, ur: NEGATIVE mg/dL
Specific Gravity, Urine: 1.014 (ref 1.005–1.030)
pH: 5 (ref 5.0–8.0)

## 2020-07-09 LAB — LIPASE, BLOOD: Lipase: 24 U/L (ref 11–51)

## 2020-07-09 LAB — RESP PANEL BY RT-PCR (FLU A&B, COVID) ARPGX2
Influenza A by PCR: NEGATIVE
Influenza B by PCR: NEGATIVE
SARS Coronavirus 2 by RT PCR: NEGATIVE

## 2020-07-09 LAB — LACTIC ACID, PLASMA: Lactic Acid, Venous: 1.3 mmol/L (ref 0.5–1.9)

## 2020-07-09 MED ORDER — HYDROMORPHONE HCL 1 MG/ML IJ SOLN
0.5000 mg | Freq: Once | INTRAMUSCULAR | Status: AC
  Administered 2020-07-09: 0.5 mg via INTRAVENOUS
  Filled 2020-07-09: qty 1

## 2020-07-09 MED ORDER — SODIUM CHLORIDE 0.9 % IV BOLUS
1000.0000 mL | Freq: Once | INTRAVENOUS | Status: AC
  Administered 2020-07-09: 1000 mL via INTRAVENOUS

## 2020-07-09 MED ORDER — ONDANSETRON HCL 4 MG/2ML IJ SOLN
4.0000 mg | Freq: Once | INTRAMUSCULAR | Status: AC
  Administered 2020-07-09: 4 mg via INTRAVENOUS
  Filled 2020-07-09: qty 2

## 2020-07-09 MED ORDER — PIPERACILLIN-TAZOBACTAM 3.375 G IVPB 30 MIN
3.3750 g | Freq: Once | INTRAVENOUS | Status: AC
  Administered 2020-07-09: 3.375 g via INTRAVENOUS
  Filled 2020-07-09: qty 50

## 2020-07-09 MED ORDER — LACTATED RINGERS IV BOLUS
1000.0000 mL | Freq: Once | INTRAVENOUS | Status: AC
  Administered 2020-07-09: 1000 mL via INTRAVENOUS

## 2020-07-09 MED ORDER — ONDANSETRON HCL 4 MG/2ML IJ SOLN: 4.0000 mg | Freq: Once | INTRAMUSCULAR | Status: AC

## 2020-07-09 MED ORDER — ONDANSETRON HCL 4 MG/2ML IJ SOLN
INTRAMUSCULAR | Status: AC
  Administered 2020-07-09: 4 mg via INTRAVENOUS
  Filled 2020-07-09: qty 2

## 2020-07-09 MED FILL — Tacrolimus Tab ER 24HR 1 MG: ORAL | 30 days supply | Qty: 60 | Fill #0 | Status: AC

## 2020-07-09 NOTE — ED Triage Notes (Signed)
Patient presents with abdominal pain, nausea, vomiting and fever. She is 9 months post liver transplant. She was supposed to see her MD last Wednesday, but missed her appointment due to symptoms. She reports symptoms have been on and off for a few weeks.

## 2020-07-09 NOTE — ED Provider Notes (Addendum)
[facility] DEPT Provider Note   CSN: [id number] Arrival date & time: 07/09/20  1418     History Chief Complaint  Patient presents with  . Abdominal Pain  . Nausea  . Emesis  . Fever    Christine Butler is a 50 y.o. female.  HPI Patient presents with fever nausea and vomiting.  His head a previous liver transplant.  Done and August.  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  Also has a choledocholithiasis.  Has some nausea vomiting and pain.  States that she is scheduled for an ERCP on Wednesday.  Transplant was done at [facility] and she is managed through [facility].  States she began to have fevers up to 102 however.  Feeling weak.  States she started to feel like she was feeling before she had a transplant.  Abdomen is not more swollen.    Past Medical History:  Diagnosis Date  . (HFpEF) heart failure with preserved ejection fraction ([place]) 08/09/2019  . Acute blood loss anemia 04/20/2017  . Altered mental status 08/14/2019  . Arthritis    right knee  . Ascites--mild this admit 11/06/2017  . Atypical chest pain 11/05/2017  . Atypical squamous cells of undetermined significance (ASCUS) on Papanicolaou smear of cervix 08/05/2018  . CAD (coronary artery disease)    a. s/p CABGx2 in 02/2017 (LAD not suitable for PCI), EF normal.  . CAD (coronary artery disease), native coronary artery 03/23/2017  . 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 Chronic low back pain   . Cirrhosis ([place]) 03/16/2017  . Coronary artery disease 03/24/2017  . Current episode of major depressive disorder without prior episode 03/13/2020  . Dyslipidemia, goal LDL below 70 10/13/2017  . Elevated LFTs 04/20/2017  . Encephalopathy 09/29/2019   hepatic encephalopathy  . Familial hyperlipidemia   . GERD (gastroesophageal reflux disease)   . GI bleed 02/01/2018  . H/O LEEP 03/30/2019  . H/O two vessel coronary artery bypass graft 03/30/2019  . High  grade squamous intraepithelial lesion (HGSIL) on cytologic smear of cervix 05/04/2019  . Hypertension   . Hypo-osmolality and hyponatremia 03/30/2019  . IBS (irritable bowel syndrome)   . Other insomnia 09/05/2019  . Pancytopenia ([place]) 09/28/2019  . PONV (postoperative nausea and vomiting)    i always throw up on waking up , but last EGD in march had no issues    . PONV (postoperative nausea and vomiting)    likes zofran and steroid to help does not like scopolamine patch  . Port-A-Cath in place 11/21/2019  . 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 IMOUPDATE  . Primary biliary cirrhosis (HCC)    cirhosis/liver disease followed by transplant team led by Dr Manuella Ghazi at [facility]   . S/P CABG (coronary artery bypass graft)   . S/P CABG x 2 03/24/2017   LIMA to DIAGONAL Portion of SVG/LEFT RADIAL to LAD  . S/P LEEP (loop electrosurgical excision procedure) 05/11/2019  . Status post liver transplantation ([place]) 03/30/2019  . SVD (spontaneous vaginal delivery)    x 3  . Upper GI bleed 02/01/2018  . Urinary incontinence    occ  . Urinary tract bacterial infections last oct 2021  . Wears glasses     Patient Active Problem List   Diagnosis Date Noted  . Chronic low back pain   . Hypertension   . Urinary incontinence   .  Wears glasses   . Current episode of major depressive disorder without prior episode 03/13/2020  . S/P CABG (coronary artery bypass graft)   . Port-A-Cath in place 11/21/2019  . Encephalopathy 09/29/2019  . Pancytopenia ([place]) 09/28/2019  . Arthritis 09/21/2019  . PONV (postoperative nausea and vomiting) 09/21/2019  . SVD (spontaneous vaginal delivery) 09/21/2019  . Urinary tract bacterial infections 09/21/2019  . CAD (coronary artery disease)   . Other insomnia 09/05/2019  . 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 Altered mental status 08/14/2019  . (HFpEF) heart failure with preserved  ejection fraction ([place]) 08/09/2019  . S/P LEEP (loop electrosurgical excision procedure) 05/11/2019  . High grade squamous intraepithelial lesion (HGSIL) on cytologic smear of cervix 05/04/2019  . Familial hyperlipidemia 03/30/2019  . H/O two vessel coronary artery bypass graft 03/30/2019  . Status post liver transplantation ([place]) 03/30/2019  . Hypo-osmolality and hyponatremia 03/30/2019  . H/O LEEP 03/30/2019  . Atypical squamous cells of undetermined significance (ASCUS) on Papanicolaou smear of cervix 08/05/2018  . Upper GI bleed 02/01/2018  . GI bleed 02/01/2018  . Ascites--mild this admit 11/06/2017  . Atypical chest pain 11/05/2017  . Dyslipidemia, goal LDL below 70 10/13/2017  . Acute blood loss anemia 04/20/2017  . Elevated LFTs 04/20/2017  . Coronary artery disease 03/24/2017  . S/P CABG x 2 03/24/2017  . CAD (coronary artery disease), native coronary artery 03/23/2017  . Cirrhosis ([place]) 03/16/2017  . GERD (gastroesophageal reflux disease) 01/28/2012  . Primary biliary cirrhosis ([place]) 01/28/2012  . IBS (irritable bowel syndrome) 01/28/2012  . 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 CERVICAL CONIZATION W/BX N/A 03/22/2020   Procedure: CONIZATION CERVIX WITH BIOPSY;  Surgeon: Dian Queen, MD;  Location: [facility];  Service: Gynecology;  Laterality: N/A;  . CHOLECYSTECTOMY  09/2019  . COLPOSCOPY N/A 03/22/2020   Procedure: COLPOSCOPY;  Surgeon: Dian Queen, MD;  Location[facility];  Service: Gynecology;  Laterality: N/A;  . CORONARY ARTERY BYPASS GRAFT N/A 03/24/2017   Procedure: CORONARY ARTERY BYPASS GRAFTING (CABG) x two, using left internal mammary artery, left radial artery, and right leg greater saphenous vein harvested endoscopically;  Surgeon: Ivin Poot, MD;  Location: [facility];  Service: Open Heart Surgery;  Laterality: N/A;  . ENDOVEIN HARVEST OF GREATER  SAPHENOUS VEIN Right 03/24/2017   Procedure: ENDOVEIN HARVEST OF GREATER SAPHENOUS VEIN;  Surgeon: Ivin Poot, MD;  Location: [facility];  Service: Open Heart Surgery;  Laterality: Right;  . ESOPHAGEAL BANDING  02/01/2018   Procedure: ESOPHAGEAL BANDING;  Surgeon: [name], MD;  Location: [facility];  Service: Gastroenterology;;  . ESOPHAGEAL BANDING N/A 03/29/2018   Procedure: ESOPHAGEAL BANDING;  Surgeon: [name], MD;  Location: [facility];  Service: Gastroenterology;  Laterality: N/A;  . ESOPHAGEAL BANDING N/A 05/21/2018   Procedure: ESOPHAGEAL BANDING;  Surgeon: [name], MD;  Location: [facility];  Service: Gastroenterology;  Laterality: N/A;  . ESOPHAGEAL BANDING N/A 10/11/2018   Procedure: ESOPHAGEAL BANDING;  Surgeon: [name], MD;  Location: [facility];  Service: Gastroenterology;  Laterality: N/A;  . ESOPHAGOGASTRODUODENOSCOPY N/A 03/29/2018   Procedure: ESOPHAGOGASTRODUODENOSCOPY (EGD);  Surgeon: [name], MD;  Location: [facility];  Service: Gastroenterology;  Laterality: N/A;  . ESOPHAGOGASTRODUODENOSCOPY N/A 05/21/2018   Procedure: ESOPHAGOGASTRODUODENOSCOPY (EGD);  Surgeon: [name], MD;  Location: [facility];  Service: Gastroenterology;  Laterality: N/A;  . ESOPHAGOGASTRODUODENOSCOPY (EGD) WITH PROPOFOL N/A 02/01/2018   Procedure: ESOPHAGOGASTRODUODENOSCOPY (  EGD) WITH PROPOFOL;  Surgeon: [name], MD;  Location: [facility];  Service: Gastroenterology;  Laterality: N/A;  . ESOPHAGOGASTRODUODENOSCOPY (EGD) WITH PROPOFOL N/A 10/11/2018   Procedure: ESOPHAGOGASTRODUODENOSCOPY (EGD) WITH PROPOFOL;  Surgeon: [name], MD;  Location: [facility];  Service: Gastroenterology;  Laterality: N/A;  . INCONTINENCE SURGERY  2009   urinary  bladder sling  . IR IMAGING GUIDED PORT INSERTION  04/08/2019  . LEEP N/A 03/28/2014   Procedure: LOOP ELECTROSURGICAL EXCISION PROCEDURE (LEEP) cone biopsy;  Surgeon: Cyril Mourning, MD;  Location: [facility];  Service: Gynecology;   Laterality: N/A;  . LEEP N/A 03/22/2020   Procedure: LOOP ELECTROSURGICAL EXCISION PROCEDURE (LEEP);  Surgeon: Dian Queen, MD;  Location: [facility];  Service: Gynecology;  Laterality: N/A;  . LEFT HEART CATH AND CORONARY ANGIOGRAPHY N/A 03/23/2017   Procedure: LEFT HEART CATH AND CORONARY ANGIOGRAPHY;  Surgeon: Jettie Booze, MD;  Location: [place] [facility];  Service: Cardiovascular;  Laterality: N/A;  . LIVER BIOPSY     x 2  . LIVER TRANSPLANT  08/ 24/2021  . RADIAL ARTERY HARVEST Left 03/24/2017   Procedure: RADIAL ARTERY HARVEST;  Surgeon: Ivin Poot, MD;  Location: [facility];  Service: Open Heart Surgery;  Laterality: Left;  . TEE WITHOUT CARDIOVERSION N/A 03/24/2017   Procedure: TRANSESOPHAGEAL ECHOCARDIOGRAM (TEE);  Surgeon: Prescott Gum, Collier Salina, MD;  Location: [facility];  Service: Open Heart Surgery;  Laterality: N/A;  . WISDOM TOOTH EXTRACTION  yrs ago     OB History    Gravida  3   Para  3   Term      Preterm      AB      Living        SAB      IAB      Ectopic      Multiple      Live Births           Obstetric Comments  None        Family History  Problem Relation Age of Onset  . CAD Father   . Diabetes Mellitus II Father   . Heart disease Father   . Heart attack Brother   . Heart disease Maternal Aunt   . Heart attack Paternal Grandmother   . Heart attack Paternal Grandfather     Social History   Tobacco Use  . Smoking status: Never Smoker  . Smokeless tobacco: Never Used  Vaping Use  . Vaping Use: Never used  Substance Use Topics  . Alcohol use: Not Currently    Alcohol/week: 1.0 standard drink    Types: 1 Glasses of wine per week  . Drug use: No    Home Medications Prior to Admission medications   Medication Sig Start Date End Date Taking? Authorizing Provider  acetaminophen (TYLENOL) 500 MG tablet Take 1,000 mg by mouth every 8 (eight) hours as needed for mild pain.    [provider]  ASPIRIN  LOW DOSE 81 MG EC tablet Take 81 mg by mouth daily. 11/23/19   [provider]  Biotin 5 MG CAPS Take 5 mg by mouth daily in the afternoon.    [provider]  CALCIUM PO Take 2,000 mg by mouth in the morning and at bedtime.    [provider]  carvedilol (COREG) 25 MG tablet Take 25 mg by mouth 2 (two) times daily.  12/26/19   [provider]  carvedilol (COREG) 25 MG tablet TAKE 1 AND  1/2 TABLETS BY MOUTH TWICE DAILY 06/26/20     cycloSPORINE (RESTASIS) 0.05 % ophthalmic emulsion Place 1 drop into both eyes at bedtime.  11/24/18   [provider]  Emollient (COLLAGEN EX) Apply 1 application topically at bedtime. 3300 mg per serving per day tablet    [provider]  ENVARSUS XR 1 MG TB24 Take 3 of the 1mg  tablets (with 1 of the 4mg  tablets for total 7mg  daily) 06/28/20     ENVARSUS XR 4 MG TB24 Take 2 tablets by mouth daily. 04/03/20   [provider]  ENVARSUS XR 4 MG TB24 Take 1 of the 4mg  tablets (with 3 of the 1mg  tablets for total dose 7mg  daily) 06/28/20     ergocalciferol (VITAMIN D2) 1.25 MG (50000 UT) capsule Take 1 capsule (50,000 Units total) by mouth once a week. 06/14/20   Elayne Snare, MD  finasteride (PROSCAR) 5 MG tablet TAKE 1/2 TABLET BY MOUTH DAILY 04/16/20 04/16/21    fluticasone (FLONASE) 50 MCG/ACT nasal spray Place 1 spray into both nostrils daily as needed for allergies or rhinitis.     [provider]  gabapentin (NEURONTIN) 300 MG capsule Take 1 capsule by  mouth at bedtime 06/07/20     mirtazapine (REMERON) 30 MG tablet Take 30 mg by mouth at bedtime. 03/13/20   [provider]  Multiple Vitamins-Minerals (DEKAS PLUS) CAPS Take 1 tablet by mouth daily.    [provider]  mycophenolate (MYFORTIC) 180 MG EC tablet TAKE 1 TABLET BY MOUTH 2 TIMES DAILY Patient not taking: Reported on 06/14/2020 04/16/20 04/16/21    nitroGLYCERIN (NITROSTAT) 0.4 MG SL tablet Place 0.4 mg under the tongue every 5 (five)  minutes as needed for chest pain.    [provider]  OLANZapine (ZYPREXA) 5 MG tablet TAKE 1 TABLET BY MOUTH NIGHTLY Patient not taking: Reported on 06/14/2020 11/09/19 11/08/20    omeprazole (PRILOSEC) 40 MG capsule Take 40 mg by mouth daily. 12/26/19   [provider]  omeprazole (PRILOSEC) 40 MG capsule TAKE 1 CAPSULE BY MOUTH DAILY. 06/26/20     ondansetron (ZOFRAN-ODT) 8 MG disintegrating tablet Take 8 mg by mouth 3 (three) times daily as needed for vomiting or nausea. Patient not taking: Reported on 06/14/2020 12/09/19   [provider]  predniSONE (DELTASONE) 5 MG tablet Take 5 mg by mouth daily. 12/14/19   [provider]  predniSONE (DELTASONE) 5 MG tablet TAKE 1 TABLET BY MOUTH DAILY. 01/12/20 01/11/21    Specialty Vitamins Products (MAGNESIUM, AMINO ACID CHELATE,) 133 MG tablet Take 2 tablets by mouth daily. 02/01/20   [provider]  Tacrolimus ER 1 MG TB24 TAKE 2 TABLETS BY MOUTH DAILY. TAKE IN ADDITION TO 4 MG TABLET FOR TOTAL DOSE OF 10 MG DAILY Patient not taking: Reported on 06/14/2020 12/22/19 12/21/20  Georga Hacking, MD  Tacrolimus ER 4 MG TB24 TAKE 2 TABLETS (8 MG TOTAL) BY MOUTH DAILY. Patient not taking: Reported on 06/14/2020 02/10/20 02/09/21    Tacrolimus ER 4 MG TB24 TAKE TWO TABLETS BY MOUTH ONCE DAILY IN ADDITION TO 1 MG TABLETS FOR TOTAL DOSE OF 10 MG DAILY Patient not taking: Reported on 06/14/2020 12/22/19 12/21/20  Georga Hacking, MD  ursodiol (ACTIGALL) 300 MG capsule Take 300 mg by mouth 3 (three) times daily. 04/16/20 04/16/21  [provider]  ursodiol (ACTIGALL) 300 MG capsule TAKE 1 CAPSULE BY MOUTH 3 TIMES DAILY 04/16/20 04/16/21    valGANciclovir (VALCYTE) 450 MG tablet TAKE  1 TABLET (450 MG TOTAL) BY MOUTH DAILY. Patient not taking: Reported on 06/14/2020 12/01/19 11/30/20    Vitamin D, Ergocalciferol, (DRISDOL) 1.25 MG (50000 UNIT) CAPS capsule TAKE 1 CAPSULE BY MOUTH ONCE A WEEK Patient not taking: Reported  on 06/14/2020 04/18/20 04/18/21      Allergies    Codeine, Erythromycin, and Fentanyl  Review of Systems   Review of Systems  Constitutional: Positive for fever. Negative for appetite change.  HENT: Negative for congestion.   Respiratory: Negative for shortness of breath.   Cardiovascular: Negative for chest pain.  Gastrointestinal: Positive for abdominal pain and nausea.  Genitourinary: Negative for flank pain.  Musculoskeletal: Negative for back pain.  Skin: Negative for rash.  Neurological: Negative for weakness.  Psychiatric/Behavioral: Negative for confusion.    Physical Exam Updated Vital Signs BP 101/63 (BP Location: Left Arm)   Pulse 89   Temp (!) 100.6 F (38.1 C) (Oral)   Resp (!) 23   Ht 5' 1.5" (1.562 m)   Wt 63.5 kg   LMP 05/10/2018 (Approximate)   SpO2 96%   BMI 26.02 kg/m   Physical Exam Vitals and nursing note reviewed.  HENT:     Head: Normocephalic.  Eyes:     General: No scleral icterus.    Pupils: Pupils are equal, round, and reactive to light.  Pulmonary:     Effort: Pulmonary effort is normal.  Abdominal:     Hernia: No hernia is present.  Skin:    General: Skin is warm.     Capillary Refill: Capillary refill takes less than 2 seconds.  Neurological:     Mental Status: She is alert and oriented to person, place, and time.  Psychiatric:        Mood and Affect: Mood normal.     ED Results / Procedures / Treatments   Labs (all labs ordered are listed, but only abnormal results are displayed) Labs Reviewed  CBC WITH DIFFERENTIAL/PLATELET - Abnormal; Notable for the following components:      Result Value   RBC 3.31 (*)    Hemoglobin 11.1 (*)    HCT 31.2 (*)    Platelets 109 (*)    Lymphs Abs 0.2 (*)    All other components within normal limits  COMPREHENSIVE METABOLIC PANEL - Abnormal; Notable for the following components:   Chloride 113 (*)    CO2 18 (*)    Glucose, Bld 113 (*)    BUN 31 (*)    Creatinine, Ser 1.20 (*)    AST  224 (*)    ALT 146 (*)    Alkaline Phosphatase 276 (*)    Total Bilirubin 3.2 (*)    GFR, Estimated 55 (*)    All other components within normal limits  URINALYSIS, ROUTINE W REFLEX MICROSCOPIC - Abnormal; Notable for the following components:   Color, Urine AMBER (*)    APPearance HAZY (*)    All other components within normal limits  CULTURE, BLOOD (ROUTINE X 2)  RESP PANEL BY RT-PCR (FLU A&B, COVID) ARPGX2  CULTURE, BLOOD (ROUTINE X 2)  URINE CULTURE  LIPASE, BLOOD  LACTIC ACID, PLASMA  LACTIC ACID, PLASMA    EKG None  Radiology No results found.  Procedures Procedures   Medications Ordered in ED Medications  ondansetron (ZOFRAN) injection 4 mg (4 mg Intravenous Given 07/09/20 1730)  sodium chloride 0.9 % bolus 1,000 mL (0 mLs Intravenous Stopped 07/09/20 1844)  piperacillin-tazobactam (ZOSYN) IVPB 3.375 g (0 g Intravenous Stopped 07/09/20 1817)  HYDROmorphone (DILAUDID) injection 0.5 mg (0.5 mg Intravenous Given 07/09/20 1853)  lactated ringers bolus 1,000 mL (1,000 mLs Intravenous New Bag/Given 07/09/20 2315)  ondansetron (ZOFRAN) injection 4 mg (4 mg Intravenous Given 07/09/20 2314)    ED Course  I have reviewed the triage vital signs and the nursing notes.  Pertinent labs & imaging results that were available during my care of the patient were reviewed by me and considered in my medical decision making (see chart for details).    MDM Rules/Calculators/A&P                         Patient post liver transplant with known biliary stricture and potentially choledocholithiasis.  Plan for outpatient ERCP in 2 days but now developed fevers.  LFTs are elevated.  Afebrile here.  White count reassuring.  Blood cultures done lactic acid done reassuring.  Zosyn started.  However with increasing LFTs and known biliary stricture/transplant is worrisome for cholangitis potentially with blockage also.  Discussed with Dr. Alice Reichert at [facility].  Accepted patient for transfer.  Appears  relatively stable at this time.  Can eat until midnight because likely not ERCP until tomorrow.  CRITICAL CARE Performed by: Davonna Belling Total critical care time: 30 minutes Critical care time was exclusive of separately billable procedures and treating other patients. Critical care was necessary to treat or prevent imminent or life-threatening deterioration. Critical care was time spent personally by me on the following activities: development of treatment plan with patient and/or surrogate as well as nursing, discussions with consultants, evaluation of patient's response to treatment, examination of patient, obtaining history from patient or surrogate, ordering and performing treatments and interventions, ordering and review of laboratory studies, ordering and review of radiographic studies, pulse oximetry and re-evaluation of patient's condition.  Patient reevaluated prior to transfer.  He did have a episode of hypotension.  Has developed a fever now also.  Still transferred to [facility].     Final Clinical Impression(s) / ED Diagnoses Final diagnoses:  Cholangitis  Liver transplant recipient [facility])    Rx / DC Orders ED Discharge Orders    None       Davonna Belling, MD 07/09/20 Ilsa Iha    Davonna Belling, MD 07/09/20 2340

## 2020-07-09 NOTE — ED Notes (Signed)
Patient ambulated to the BR with assistance- tolerated well but reports feeling weak.  Patient's color is pale and dark circles noted around eyes.  Requested Ice chips.  Patient is awaiting for assessment.

## 2020-07-09 NOTE — ED Notes (Signed)
Patients blood pressure continues to drop. She also feels nauseous. Patient given bolus of fluids and mediation for nausea.

## 2020-07-09 NOTE — ED Notes (Signed)
Report given to [place], RN at [phone] opt 2, with [facility].

## 2020-07-09 NOTE — ED Provider Notes (Signed)
Emergency Medicine Provider Triage Evaluation Note  Christine Butler , a 50 y.o. female  was evaluated in triage.  Pt complains of abd pain.  Review of Systems  Positive: Fever, n/v, abd pain Negative: Cp, sob, cough  Physical Exam  BP (!) 143/85 (BP Location: Left Arm)   Pulse (!) 107   Temp 99.3 F (37.4 C) (Oral)   Resp 18   Ht 5' 1.5" (1.562 m)   Wt 63.5 kg   LMP 05/10/2018 (Approximate)   SpO2 95%   BMI 26.02 kg/m  Gen:   Awake, ill appearing Resp:  Normal effort  MSK:   Moves extremities without difficulty  Other:  Mild diffuse abd tenderness, pale appearance  Medical Decision Making  Medically screening exam initiated at 2:44 PM.  Appropriate orders placed.  Christine Butler was informed that the remainder of the evaluation will be completed by another provider, this initial triage assessment does not replace that evaluation, and the importance of remaining in the ED until their evaluation is complete.  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  Hx of common bile duct blockage in the past.  Received care at [facility].     Domenic Moras, PA-C 07/09/20 1447    Gareth Morgan, MD 07/11/20 [phone]

## 2020-07-09 NOTE — ED Notes (Signed)
Patient going to [facility] in [place]. Called [phone], transferred to [phone].

## 2020-07-10 ENCOUNTER — Other Ambulatory Visit (HOSPITAL_COMMUNITY): Payer: Self-pay

## 2020-07-10 ENCOUNTER — Ambulatory Visit
Admit: 2020-07-10 | Discharge: 2020-07-13 | Disposition: A | Discharge status: Home / Self Care | Payer: PRIVATE HEALTH INSURANCE | Source: Other Acute Inpatient Hospital

## 2020-07-10 ENCOUNTER — Encounter
Admit: 2020-07-10 | Discharge: 2020-07-13 | Disposition: A | Discharge status: Home / Self Care | Payer: PRIVATE HEALTH INSURANCE | Source: Other Acute Inpatient Hospital | Attending: Anesthesiology

## 2020-07-10 DIAGNOSIS — R932 Abnormal findings on diagnostic imaging of liver and biliary tract: Secondary | ICD-10-CM | POA: Diagnosis not present

## 2020-07-10 DIAGNOSIS — I509 Heart failure, unspecified: Secondary | ICD-10-CM | POA: Diagnosis not present

## 2020-07-10 DIAGNOSIS — K743 Primary biliary cirrhosis: Secondary | ICD-10-CM | POA: Diagnosis not present

## 2020-07-10 DIAGNOSIS — Z944 Liver transplant status: Secondary | ICD-10-CM | POA: Diagnosis not present

## 2020-07-10 DIAGNOSIS — I2510 Atherosclerotic heart disease of native coronary artery without angina pectoris: Secondary | ICD-10-CM | POA: Diagnosis not present

## 2020-07-10 DIAGNOSIS — Z7982 Long term (current) use of aspirin: Secondary | ICD-10-CM | POA: Diagnosis not present

## 2020-07-10 DIAGNOSIS — Z5181 Encounter for therapeutic drug level monitoring: Secondary | ICD-10-CM | POA: Diagnosis not present

## 2020-07-10 DIAGNOSIS — R7401 Elevation of levels of liver transaminase levels: Secondary | ICD-10-CM | POA: Diagnosis not present

## 2020-07-10 DIAGNOSIS — K838 Other specified diseases of biliary tract: Secondary | ICD-10-CM | POA: Diagnosis not present

## 2020-07-10 DIAGNOSIS — D849 Immunodeficiency, unspecified: Secondary | ICD-10-CM | POA: Diagnosis not present

## 2020-07-10 DIAGNOSIS — Z79899 Other long term (current) drug therapy: Secondary | ICD-10-CM | POA: Diagnosis not present

## 2020-07-10 DIAGNOSIS — N2889 Other specified disorders of kidney and ureter: Secondary | ICD-10-CM | POA: Diagnosis not present

## 2020-07-10 DIAGNOSIS — K831 Obstruction of bile duct: Secondary | ICD-10-CM | POA: Diagnosis not present

## 2020-07-10 DIAGNOSIS — R188 Other ascites: Secondary | ICD-10-CM | POA: Diagnosis not present

## 2020-07-10 DIAGNOSIS — R109 Unspecified abdominal pain: Secondary | ICD-10-CM | POA: Diagnosis not present

## 2020-07-10 DIAGNOSIS — I5032 Chronic diastolic (congestive) heart failure: Secondary | ICD-10-CM | POA: Diagnosis not present

## 2020-07-10 DIAGNOSIS — R112 Nausea with vomiting, unspecified: Secondary | ICD-10-CM | POA: Diagnosis not present

## 2020-07-10 DIAGNOSIS — Z9689 Presence of other specified functional implants: Secondary | ICD-10-CM | POA: Diagnosis not present

## 2020-07-10 DIAGNOSIS — R1013 Epigastric pain: Secondary | ICD-10-CM | POA: Diagnosis not present

## 2020-07-10 DIAGNOSIS — Z951 Presence of aortocoronary bypass graft: Secondary | ICD-10-CM | POA: Diagnosis not present

## 2020-07-10 DIAGNOSIS — K9189 Other postprocedural complications and disorders of digestive system: Secondary | ICD-10-CM | POA: Diagnosis not present

## 2020-07-10 DIAGNOSIS — Z7952 Long term (current) use of systemic steroids: Secondary | ICD-10-CM | POA: Diagnosis not present

## 2020-07-11 DIAGNOSIS — Z944 Liver transplant status: Principal | ICD-10-CM

## 2020-07-11 DIAGNOSIS — Z79899 Other long term (current) drug therapy: Secondary | ICD-10-CM | POA: Diagnosis not present

## 2020-07-11 DIAGNOSIS — K9189 Other postprocedural complications and disorders of digestive system: Secondary | ICD-10-CM | POA: Diagnosis not present

## 2020-07-11 DIAGNOSIS — Z5181 Encounter for therapeutic drug level monitoring: Secondary | ICD-10-CM | POA: Diagnosis not present

## 2020-07-11 DIAGNOSIS — R1013 Epigastric pain: Secondary | ICD-10-CM | POA: Diagnosis not present

## 2020-07-11 DIAGNOSIS — K831 Obstruction of bile duct: Secondary | ICD-10-CM | POA: Diagnosis not present

## 2020-07-11 LAB — URINE CULTURE: Culture: 60000 — AB

## 2020-07-12 ENCOUNTER — Telehealth: Payer: Self-pay | Admitting: Emergency Medicine

## 2020-07-12 DIAGNOSIS — K838 Other specified diseases of biliary tract: Secondary | ICD-10-CM | POA: Diagnosis not present

## 2020-07-12 DIAGNOSIS — R1013 Epigastric pain: Secondary | ICD-10-CM | POA: Diagnosis not present

## 2020-07-12 DIAGNOSIS — K831 Obstruction of bile duct: Secondary | ICD-10-CM | POA: Diagnosis not present

## 2020-07-12 DIAGNOSIS — Z944 Liver transplant status: Secondary | ICD-10-CM | POA: Diagnosis not present

## 2020-07-12 DIAGNOSIS — Z9689 Presence of other specified functional implants: Secondary | ICD-10-CM | POA: Diagnosis not present

## 2020-07-12 DIAGNOSIS — Z931 Gastrostomy status: Secondary | ICD-10-CM

## 2020-07-12 HISTORY — DX: Gastrostomy status: Z93.1

## 2020-07-12 MED ORDER — AMOXICILLIN 500 MG-POTASSIUM CLAVULANATE 125 MG TABLET
ORAL_TABLET | Freq: Two times a day (BID) | ORAL | 0 refills | 5.00000 days
Start: 2020-07-12 — End: 2020-07-17

## 2020-07-12 MED ORDER — ASPIRIN 81 MG TABLET,DELAYED RELEASE
ORAL_TABLET | Freq: Every day | ORAL | 10 refills | 100 days
Start: 2020-07-12 — End: ?

## 2020-07-12 NOTE — Telephone Encounter (Signed)
Post ED Visit - Positive Culture Follow-up  Culture report reviewed by antimicrobial stewardship pharmacist: [place] Team []  Elenor Quinones, Pharm.D. []  Heide Guile, Pharm.D., BCPS AQ-ID []  Parks Neptune, Pharm.D., BCPS []  Alycia Rossetti, Pharm.D., BCPS []  [place], Pharm.D., BCPS, AAHIVP []  Legrand Como, Pharm.D., BCPS, AAHIVP []  Salome Arnt, PharmD, BCPS []  Johnnette Gourd, PharmD, BCPS []  Hughes Better, PharmD, BCPS []  Leeroy Cha, PharmD []  Laqueta Linden, PharmD, BCPS []  Albertina Parr, PharmD  [place] Team []  Leodis Sias, PharmD []  Lindell Spar, PharmD []  Royetta Asal, PharmD [x]  Graylin Shiver, Rph []  Rema Fendt) Glennon Mac, PharmD []  Arlyn Dunning, PharmD []  Netta Cedars, PharmD []  Dia Sitter, PharmD []  Leone Haven, PharmD []  Gretta Arab, PharmD []  Theodis Shove, PharmD []  Peggyann Juba, PharmD []  Reuel Boom, PharmD   Positive urine culture Treated with none, asymptomatic, organism sensitive to the same and no further patient follow-up is required at this time.  Hazle Nordmann 07/12/2020, 12:44 PM

## 2020-07-13 ENCOUNTER — Other Ambulatory Visit (HOSPITAL_COMMUNITY): Payer: Self-pay

## 2020-07-13 DIAGNOSIS — K769 Liver disease, unspecified: Principal | ICD-10-CM

## 2020-07-13 DIAGNOSIS — Z944 Liver transplant status: Principal | ICD-10-CM

## 2020-07-13 DIAGNOSIS — T8640 Unspecified complication of liver transplant: Principal | ICD-10-CM

## 2020-07-13 DIAGNOSIS — Z9689 Presence of other specified functional implants: Secondary | ICD-10-CM | POA: Diagnosis not present

## 2020-07-13 DIAGNOSIS — R112 Nausea with vomiting, unspecified: Secondary | ICD-10-CM | POA: Diagnosis not present

## 2020-07-13 DIAGNOSIS — R7401 Elevation of levels of liver transaminase levels: Secondary | ICD-10-CM | POA: Diagnosis not present

## 2020-07-13 MED ORDER — CARVEDILOL 25 MG TABLET
ORAL_TABLET | Freq: Two times a day (BID) | ORAL | 11 refills | 30 days | Status: CP
Start: 2020-07-13 — End: ?

## 2020-07-13 MED ORDER — ASPIRIN 81 MG TABLET,DELAYED RELEASE
ORAL_TABLET | Freq: Every day | ORAL | 11 refills | 30 days | Status: CP
Start: 2020-07-13 — End: ?
  Filled 2020-07-13: qty 30, 30d supply, fill #0

## 2020-07-13 MED ORDER — ENVARSUS XR 4 MG TABLET,EXTENDED RELEASE
ORAL_TABLET | 11 refills | 0.00000 days | Status: CP
Start: 2020-07-13 — End: 2020-07-13

## 2020-07-13 MED ORDER — MIRTAZAPINE 30 MG TABLET
ORAL_TABLET | Freq: Every evening | ORAL | 11 refills | 30.00000 days | Status: CP
Start: 2020-07-13 — End: 2020-08-12

## 2020-07-13 MED ORDER — ENVARSUS XR 1 MG TABLET,EXTENDED RELEASE
ORAL_TABLET | 11 refills | 0.00000 days | Status: CP
Start: 2020-07-13 — End: 2020-07-13

## 2020-07-13 MED ORDER — MIRTAZAPINE 30 MG PO TABS
30.0000 mg | ORAL_TABLET | Freq: Every evening | ORAL | 11 refills | Status: DC
  Filled 2020-07-13: qty 30, 30d supply, fill #0

## 2020-07-13 MED FILL — AMOXICILLIN 500 MG-POTASSIUM CLAVULANATE 125 MG TABLET: ORAL | 4 days supply | Qty: 7 | Fill #0

## 2020-07-15 LAB — CULTURE, BLOOD (ROUTINE X 2)
Culture: NO GROWTH
Culture: NO GROWTH

## 2020-07-16 ENCOUNTER — Other Ambulatory Visit: Payer: Self-pay | Admitting: *Deleted

## 2020-07-16 NOTE — Patient Outreach (Signed)
[facility]) Care Management  07/16/2020  Christine Butler 1970-05-13 [id number]   Transition of care telephone call  Referral received:07/11/20 Initial outreach:07/16/20 Insurance: [facility]   Initial unsuccessful telephone call to patient's preferred number in order to complete transition of care assessment; no answer, left HIPAA compliant voicemail message requesting return call.   Objective: Per the electronic medical record, Christine Butler   was hospitalized at [facility] 5/17-5/20/22  for Cholangitis,epigastric pain, , s/p ERCP hepaticogastrostomy stent placement . Comorbidities include: Liver Transplant 8/24/59m CAD, CABG x2, heart failure,  She was discharged to home on 07/13/20 without the need for home health services or durable medical equipment per the discharge summary.   Plan: This RNCM will route unsuccessful outreach letter with [place] Management pamphlet and 24 hour Nurse Advice Line Magnet to [facility] clinical pool to be mailed to patient's home address. This RNCM will attempt another outreach within 4 business days.   [name], RN, BSN  [place] Management Coordinator  [phone]- Mobile [phone]- Toll Free Main Office

## 2020-07-17 ENCOUNTER — Other Ambulatory Visit (HOSPITAL_COMMUNITY): Payer: Self-pay

## 2020-07-17 DIAGNOSIS — K219 Gastro-esophageal reflux disease without esophagitis: Principal | ICD-10-CM

## 2020-07-17 DIAGNOSIS — Z944 Liver transplant status: Principal | ICD-10-CM

## 2020-07-17 MED ORDER — OMEPRAZOLE 40 MG CAPSULE,DELAYED RELEASE
ORAL_CAPSULE | Freq: Two times a day (BID) | ORAL | 5 refills | 30 days | Status: CP
Start: 2020-07-17 — End: ?

## 2020-07-17 MED ORDER — OMEPRAZOLE 40 MG PO CPDR
DELAYED_RELEASE_CAPSULE | ORAL | 5 refills | Status: DC
  Filled 2020-07-17 – 2020-07-20 (×2): qty 60, 30d supply, fill #0
  Filled 2020-09-10: qty 60, 30d supply, fill #1

## 2020-07-18 ENCOUNTER — Other Ambulatory Visit (HOSPITAL_COMMUNITY): Payer: Self-pay

## 2020-07-18 ENCOUNTER — Other Ambulatory Visit (HOSPITAL_COMMUNITY)
Admission: RE | Admit: 2020-07-18 | Discharge: 2020-07-18 | Disposition: A | Discharge status: Home / Self Care | Payer: 59 | Source: Other Acute Inpatient Hospital | Attending: Transplant Hepatology | Admitting: Transplant Hepatology

## 2020-07-18 DIAGNOSIS — Z944 Liver transplant status: Secondary | ICD-10-CM | POA: Diagnosis not present

## 2020-07-18 DIAGNOSIS — Z79899 Other long term (current) drug therapy: Secondary | ICD-10-CM | POA: Diagnosis not present

## 2020-07-18 LAB — CBC WITH DIFFERENTIAL/PLATELET
Abs Immature Granulocytes: 0.07 10*3/uL (ref 0.00–0.07)
Basophils Absolute: 0 10*3/uL (ref 0.0–0.1)
Basophils Relative: 0 %
Eosinophils Absolute: 0.1 10*3/uL (ref 0.0–0.5)
Eosinophils Relative: 2 %
HCT: 30.3 % — ABNORMAL LOW (ref 36.0–46.0)
Hemoglobin: 10.5 g/dL — ABNORMAL LOW (ref 12.0–15.0)
Immature Granulocytes: 1 %
Lymphocytes Relative: 13 %
Lymphs Abs: 0.7 10*3/uL (ref 0.7–4.0)
MCH: 32.9 pg (ref 26.0–34.0)
MCHC: 34.7 g/dL (ref 30.0–36.0)
MCV: 95 fL (ref 80.0–100.0)
Monocytes Absolute: 0.4 10*3/uL (ref 0.1–1.0)
Monocytes Relative: 8 %
Neutro Abs: 4 10*3/uL (ref 1.7–7.7)
Neutrophils Relative %: 76 %
Platelets: 190 10*3/uL (ref 150–400)
RBC: 3.19 MIL/uL — ABNORMAL LOW (ref 3.87–5.11)
RDW: 12.4 % (ref 11.5–15.5)
WBC: 5.4 10*3/uL (ref 4.0–10.5)
nRBC: 0 % (ref 0.0–0.2)

## 2020-07-18 LAB — COMPREHENSIVE METABOLIC PANEL
ALT: 27 U/L (ref 0–44)
AST: 24 U/L (ref 15–41)
Albumin: 3.7 g/dL (ref 3.5–5.0)
Alkaline Phosphatase: 138 U/L — ABNORMAL HIGH (ref 38–126)
Anion gap: 9 (ref 5–15)
BUN: 24 mg/dL — ABNORMAL HIGH (ref 6–20)
CO2: 19 mmol/L — ABNORMAL LOW (ref 22–32)
Calcium: 8.7 mg/dL — ABNORMAL LOW (ref 8.9–10.3)
Chloride: 111 mmol/L (ref 98–111)
Creatinine, Ser: 1.51 mg/dL — ABNORMAL HIGH (ref 0.44–1.00)
GFR, Estimated: 42 mL/min — ABNORMAL LOW (ref >60)
Glucose, Bld: 120 mg/dL — ABNORMAL HIGH (ref 70–99)
Potassium: 5.2 mmol/L — ABNORMAL HIGH (ref 3.5–5.1)
Sodium: 139 mmol/L (ref 135–145)
Total Bilirubin: 1.7 mg/dL — ABNORMAL HIGH (ref 0.3–1.2)
Total Protein: 6.8 g/dL (ref 6.5–8.1)

## 2020-07-18 LAB — BILIRUBIN, DIRECT: Bilirubin, Direct: 0.5 mg/dL — ABNORMAL HIGH (ref 0.0–0.2)

## 2020-07-18 LAB — MAGNESIUM: Magnesium: 1.6 mg/dL — ABNORMAL LOW (ref 1.7–2.4)

## 2020-07-18 LAB — GAMMA GT: GGT: 51 U/L — ABNORMAL HIGH (ref 7–50)

## 2020-07-18 LAB — PHOSPHORUS: Phosphorus: 4 mg/dL (ref 2.5–4.6)

## 2020-07-19 ENCOUNTER — Other Ambulatory Visit: Payer: Self-pay | Admitting: *Deleted

## 2020-07-19 ENCOUNTER — Encounter: Payer: Self-pay | Admitting: *Deleted

## 2020-07-19 NOTE — Patient Outreach (Signed)
[facility]) Care Management  07/19/2020  TANESSA TIDD April 07, 1970 [id number]   Transition of care call/case closure   Referral received:07/11/20 Initial outreach:07/16/20 Insurance: [place] [employer]    Subjective: 2nd attempt l successful telephone call to patient's preferred number in order to complete transition of care assessment; 2 HIPAA identifiers verified. Explained purpose of call and completed transition of care assessment.  Dotty states that she is doing better. Patient known to RN with prior follow up after liver transplant. Getsemani states she was doing well until few weeks ago she has returned to work in March. She discussed recent admission and diagnosis. She shared she has an another outpatient  ERCP June 6 with attempt to get stent past stricture. She report no further vomiting , appetite still not back she is on soft diet and taking it easy. She abdominal area pain improved but still has some.  Denies bowel or bladder problems. Family is assisting in her  recovery.   Reviewed accessing the following [place] Benefits : She discussed  ongoing health issues and reports that she enrolled with Active health management program has not yet been able to attend phone visit .  She  uses a [facility] outpatient pharmacy, [facility].   .    Objective: Rhyse Skowron   was hospitalized at [facility] 5/17-5/20/22  for Cholangitis,epigastric pain, , s/p ERCP hepaticogastrostomy stent placement . Comorbidities include: Liver Transplant 8/24/66m CAD, CABG x2, heart failure,  She was discharged to home on 07/13/20 without the need for home health services or durable medical equipment per the discharge summary.   Assessment:  Patient voices good understanding of all discharge instructions.  See transition of care flowsheet for assessment details.   Plan:  Reviewed hospital discharge diagnosis of Cholangitis,epigastric pain, , s/p ERCP hepaticogastrostomy  stent placement .      and discharge treatment plan using hospital discharge instructions, assessing medication adherence, reviewing problems requiring provider notification, and discussing the importance of follow up with surgeon, primary care provider and/or specialists as directed.  Reviewed [place] healthy lifestyle program information to receive discounted premium for  2023   Step 1: Get  your annual physical  Step 2: Complete your health assessment  Step 3:Identify your current health status and complete the corresponding action step between February 25, 2020 and October 25, 2020.    Using [place] website, verified that patient is an active participate in [place]'s Active Health Management chronic disease management program.    No ongoing care management needs identified so will close case to [place] Management services and route successful outreach letter with [place] Management pamphlet and 24 Hour Nurse Line Magnet to [facility] Management clinical pool to be mailed to patient's home address.  Thanked patient for their services to [facility].  [name], RN, BSN  [place] Management Coordinator  [phone]- Mobile [phone]- Toll Free Main Office

## 2020-07-20 ENCOUNTER — Other Ambulatory Visit (HOSPITAL_COMMUNITY): Payer: Self-pay

## 2020-07-20 DIAGNOSIS — Z944 Liver transplant status: Principal | ICD-10-CM

## 2020-07-20 DIAGNOSIS — Z79899 Other long term (current) drug therapy: Principal | ICD-10-CM

## 2020-07-20 LAB — TACROLIMUS LEVEL: Tacrolimus (FK506) - LabCorp: 10 ng/mL (ref 2.0–20.0)

## 2020-07-20 MED ORDER — ENVARSUS XR 4 MG TABLET,EXTENDED RELEASE
ORAL_TABLET | Freq: Every day | ORAL | 11 refills | 30 days | Status: CP
Start: 2020-07-20 — End: ?

## 2020-07-20 MED ORDER — ENVARSUS XR 4 MG PO TB24: ORAL_TABLET | ORAL | 11 refills | Status: DC

## 2020-07-21 ENCOUNTER — Other Ambulatory Visit (HOSPITAL_COMMUNITY): Payer: Self-pay

## 2020-07-23 MED FILL — Ursodiol Cap 300 MG: ORAL | 90 days supply | Qty: 270 | Fill #0 | Status: AC

## 2020-07-23 MED FILL — Prednisone Tab 5 MG: ORAL | 30 days supply | Qty: 30 | Fill #1 | Status: AC

## 2020-07-24 ENCOUNTER — Other Ambulatory Visit (HOSPITAL_COMMUNITY): Payer: Self-pay

## 2020-07-25 ENCOUNTER — Other Ambulatory Visit (HOSPITAL_COMMUNITY): Payer: Self-pay

## 2020-07-26 ENCOUNTER — Other Ambulatory Visit (HOSPITAL_COMMUNITY): Payer: Self-pay

## 2020-07-26 DIAGNOSIS — Z944 Liver transplant status: Principal | ICD-10-CM

## 2020-07-26 DIAGNOSIS — T8640 Unspecified complication of liver transplant: Principal | ICD-10-CM

## 2020-07-26 MED ORDER — AMOXICILLIN 500 MG-POTASSIUM CLAVULANATE 125 MG TABLET
ORAL_TABLET | Freq: Two times a day (BID) | ORAL | 0 refills | 7.00000 days | Status: CP
Start: 2020-07-26 — End: ?

## 2020-07-26 MED ORDER — AMOXICILLIN-POT CLAVULANATE 500-125 MG PO TABS
ORAL_TABLET | ORAL | 0 refills | Status: DC
  Filled 2020-07-26: qty 14, 7d supply, fill #0

## 2020-07-30 ENCOUNTER — Encounter: Admit: 2020-07-30 | Discharge: 2020-07-30 | Payer: PRIVATE HEALTH INSURANCE

## 2020-07-30 ENCOUNTER — Ambulatory Visit: Admit: 2020-07-30 | Discharge: 2020-07-30 | Payer: PRIVATE HEALTH INSURANCE

## 2020-07-30 DIAGNOSIS — Z881 Allergy status to other antibiotic agents status: Secondary | ICD-10-CM | POA: Diagnosis not present

## 2020-07-30 DIAGNOSIS — Z4659 Encounter for fitting and adjustment of other gastrointestinal appliance and device: Secondary | ICD-10-CM | POA: Diagnosis not present

## 2020-07-30 DIAGNOSIS — K831 Obstruction of bile duct: Secondary | ICD-10-CM | POA: Diagnosis not present

## 2020-07-30 DIAGNOSIS — I2510 Atherosclerotic heart disease of native coronary artery without angina pectoris: Secondary | ICD-10-CM | POA: Diagnosis not present

## 2020-07-30 DIAGNOSIS — Z79899 Other long term (current) drug therapy: Secondary | ICD-10-CM | POA: Diagnosis not present

## 2020-07-30 DIAGNOSIS — Z7982 Long term (current) use of aspirin: Secondary | ICD-10-CM | POA: Diagnosis not present

## 2020-07-30 DIAGNOSIS — Z944 Liver transplant status: Secondary | ICD-10-CM | POA: Diagnosis not present

## 2020-07-30 DIAGNOSIS — Z885 Allergy status to narcotic agent status: Secondary | ICD-10-CM | POA: Diagnosis not present

## 2020-07-30 DIAGNOSIS — K9189 Other postprocedural complications and disorders of digestive system: Secondary | ICD-10-CM | POA: Diagnosis not present

## 2020-07-30 DIAGNOSIS — Z951 Presence of aortocoronary bypass graft: Secondary | ICD-10-CM | POA: Diagnosis not present

## 2020-08-02 ENCOUNTER — Other Ambulatory Visit (HOSPITAL_COMMUNITY): Payer: Self-pay

## 2020-08-02 ENCOUNTER — Ambulatory Visit
Admit: 2020-08-02 | Discharge: 2020-08-02 | Payer: PRIVATE HEALTH INSURANCE | Attending: Student in an Organized Health Care Education/Training Program | Primary: Student in an Organized Health Care Education/Training Program

## 2020-08-02 ENCOUNTER — Institutional Professional Consult (permissible substitution): Admit: 2020-08-02 | Discharge: 2020-08-02 | Payer: PRIVATE HEALTH INSURANCE

## 2020-08-02 ENCOUNTER — Ambulatory Visit: Admit: 2020-08-02 | Discharge: 2020-08-02 | Payer: PRIVATE HEALTH INSURANCE

## 2020-08-02 DIAGNOSIS — Z944 Liver transplant status: Principal | ICD-10-CM

## 2020-08-02 DIAGNOSIS — Z23 Encounter for immunization: Secondary | ICD-10-CM | POA: Diagnosis not present

## 2020-08-02 MED ORDER — MIRTAZAPINE 15 MG TABLET
ORAL_TABLET | Freq: Every evening | ORAL | 2 refills | 30 days | Status: CP
Start: 2020-08-02 — End: 2021-08-02

## 2020-08-02 MED ORDER — MIRTAZAPINE 15 MG PO TABS
ORAL_TABLET | ORAL | 2 refills | Status: DC
  Filled 2020-08-02: qty 30, 30d supply, fill #0

## 2020-08-03 DIAGNOSIS — Z944 Liver transplant status: Principal | ICD-10-CM

## 2020-08-03 DIAGNOSIS — Z79899 Other long term (current) drug therapy: Principal | ICD-10-CM

## 2020-08-06 DIAGNOSIS — Z944 Liver transplant status: Principal | ICD-10-CM

## 2020-08-06 DIAGNOSIS — Z79899 Other long term (current) drug therapy: Principal | ICD-10-CM

## 2020-08-07 ENCOUNTER — Other Ambulatory Visit (HOSPITAL_COMMUNITY)
Admission: RE | Admit: 2020-08-07 | Discharge: 2020-08-07 | Disposition: A | Discharge status: Home / Self Care | Payer: 59 | Source: Ambulatory Visit | Attending: Transplant Hepatology | Admitting: Transplant Hepatology

## 2020-08-07 DIAGNOSIS — Z79899 Other long term (current) drug therapy: Secondary | ICD-10-CM | POA: Insufficient documentation

## 2020-08-07 DIAGNOSIS — Z944 Liver transplant status: Secondary | ICD-10-CM | POA: Diagnosis not present

## 2020-08-07 LAB — CBC WITH DIFFERENTIAL/PLATELET
Abs Immature Granulocytes: 0.05 10*3/uL (ref 0.00–0.07)
Basophils Absolute: 0 10*3/uL (ref 0.0–0.1)
Basophils Relative: 1 %
Eosinophils Absolute: 0.1 10*3/uL (ref 0.0–0.5)
Eosinophils Relative: 3 %
HCT: 28.8 % — ABNORMAL LOW (ref 36.0–46.0)
Hemoglobin: 9.9 g/dL — ABNORMAL LOW (ref 12.0–15.0)
Immature Granulocytes: 1 %
Lymphocytes Relative: 20 %
Lymphs Abs: 1 10*3/uL (ref 0.7–4.0)
MCH: 33.1 pg (ref 26.0–34.0)
MCHC: 34.4 g/dL (ref 30.0–36.0)
MCV: 96.3 fL (ref 80.0–100.0)
Monocytes Absolute: 0.4 10*3/uL (ref 0.1–1.0)
Monocytes Relative: 8 %
Neutro Abs: 3.2 10*3/uL (ref 1.7–7.7)
Neutrophils Relative %: 67 %
Platelets: 176 10*3/uL (ref 150–400)
RBC: 2.99 MIL/uL — ABNORMAL LOW (ref 3.87–5.11)
RDW: 14.3 % (ref 11.5–15.5)
WBC: 4.7 10*3/uL (ref 4.0–10.5)
nRBC: 0 % (ref 0.0–0.2)

## 2020-08-07 LAB — PHOSPHORUS: Phosphorus: 3.7 mg/dL (ref 2.5–4.6)

## 2020-08-07 LAB — COMPREHENSIVE METABOLIC PANEL
ALT: 14 U/L (ref 0–44)
AST: 15 U/L (ref 15–41)
Albumin: 3.8 g/dL (ref 3.5–5.0)
Alkaline Phosphatase: 131 U/L — ABNORMAL HIGH (ref 38–126)
Anion gap: 7 (ref 5–15)
BUN: 28 mg/dL — ABNORMAL HIGH (ref 6–20)
CO2: 20 mmol/L — ABNORMAL LOW (ref 22–32)
Calcium: 9.1 mg/dL (ref 8.9–10.3)
Chloride: 112 mmol/L — ABNORMAL HIGH (ref 98–111)
Creatinine, Ser: 1.19 mg/dL — ABNORMAL HIGH (ref 0.44–1.00)
GFR, Estimated: 56 mL/min — ABNORMAL LOW (ref >60)
Glucose, Bld: 112 mg/dL — ABNORMAL HIGH (ref 70–99)
Potassium: 4.5 mmol/L (ref 3.5–5.1)
Sodium: 139 mmol/L (ref 135–145)
Total Bilirubin: 0.9 mg/dL (ref 0.3–1.2)
Total Protein: 6.9 g/dL (ref 6.5–8.1)

## 2020-08-07 LAB — MAGNESIUM: Magnesium: 1.9 mg/dL (ref 1.7–2.4)

## 2020-08-07 LAB — BILIRUBIN, DIRECT: Bilirubin, Direct: 0.2 mg/dL (ref 0.0–0.2)

## 2020-08-07 LAB — GAMMA GT: GGT: 38 U/L (ref 7–50)

## 2020-08-09 LAB — TACROLIMUS LEVEL: Tacrolimus (FK506) - LabCorp: 4.2 ng/mL (ref 2.0–20.0)

## 2020-08-10 ENCOUNTER — Other Ambulatory Visit (HOSPITAL_COMMUNITY): Payer: Self-pay

## 2020-08-10 DIAGNOSIS — Z79899 Other long term (current) drug therapy: Principal | ICD-10-CM

## 2020-08-10 DIAGNOSIS — Z944 Liver transplant status: Principal | ICD-10-CM

## 2020-08-10 MED ORDER — ENVARSUS XR 4 MG TABLET,EXTENDED RELEASE
ORAL_TABLET | 11 refills | 0 days | Status: CP
Start: 2020-08-10 — End: ?

## 2020-08-10 MED ORDER — ENVARSUS XR 1 MG TABLET,EXTENDED RELEASE
ORAL_TABLET | 11 refills | 0 days | Status: CP
Start: 2020-08-10 — End: ?

## 2020-08-10 MED ORDER — ENVARSUS XR 1 MG PO TB24
ORAL_TABLET | ORAL | 11 refills | Status: DC
  Filled 2020-09-12: qty 30, 30d supply, fill #0
  Filled 2020-10-04: qty 30, 30d supply, fill #1
  Filled 2020-11-05: qty 30, 30d supply, fill #2
  Filled 2020-12-03: qty 30, 30d supply, fill #3
  Filled 2021-01-10: qty 30, 30d supply, fill #4
  Filled 2021-02-11: qty 30, 30d supply, fill #5
  Filled 2021-03-07: qty 30, 30d supply, fill #6

## 2020-08-10 MED ORDER — ENVARSUS XR 4 MG PO TB24
ORAL_TABLET | ORAL | 11 refills | Status: DC
  Filled 2020-09-12: qty 30, 30d supply, fill #0
  Filled 2020-10-04: qty 30, 30d supply, fill #1
  Filled 2020-11-05: qty 30, 30d supply, fill #2
  Filled 2020-12-03: qty 30, 30d supply, fill #3
  Filled 2021-01-10: qty 30, 30d supply, fill #4
  Filled 2021-02-11: qty 30, 30d supply, fill #5
  Filled 2021-03-07: qty 30, 30d supply, fill #6
  Filled 2021-07-11: qty 30, 30d supply, fill #7

## 2020-08-13 ENCOUNTER — Other Ambulatory Visit (HOSPITAL_COMMUNITY): Payer: Self-pay

## 2020-08-13 DIAGNOSIS — Z944 Liver transplant status: Principal | ICD-10-CM

## 2020-08-13 DIAGNOSIS — Z79899 Other long term (current) drug therapy: Principal | ICD-10-CM

## 2020-08-20 ENCOUNTER — Other Ambulatory Visit (HOSPITAL_COMMUNITY): Payer: Self-pay

## 2020-08-20 DIAGNOSIS — Z944 Liver transplant status: Principal | ICD-10-CM

## 2020-08-20 DIAGNOSIS — Z79899 Other long term (current) drug therapy: Principal | ICD-10-CM

## 2020-08-20 DIAGNOSIS — M5430 Sciatica, unspecified side: Secondary | ICD-10-CM | POA: Diagnosis not present

## 2020-08-20 MED FILL — Prednisone Tab 5 MG: ORAL | 30 days supply | Qty: 30 | Fill #2 | Status: AC

## 2020-08-21 ENCOUNTER — Other Ambulatory Visit (HOSPITAL_COMMUNITY): Payer: Self-pay

## 2020-08-21 MED ORDER — PREDNISONE 10 MG PO TABS
ORAL_TABLET | ORAL | 0 refills | Status: DC
  Filled 2020-08-21: qty 20, 8d supply, fill #0

## 2020-08-23 ENCOUNTER — Other Ambulatory Visit (HOSPITAL_COMMUNITY): Payer: Self-pay

## 2020-08-23 MED ORDER — HYDROCODONE-ACETAMINOPHEN 5-325 MG PO TABS
ORAL_TABLET | ORAL | 0 refills | Status: DC
  Filled 2020-08-23: qty 20, 5d supply, fill #0

## 2020-08-24 ENCOUNTER — Other Ambulatory Visit (HOSPITAL_COMMUNITY): Payer: Self-pay

## 2020-08-27 DIAGNOSIS — Z944 Liver transplant status: Principal | ICD-10-CM

## 2020-08-27 DIAGNOSIS — Z79899 Other long term (current) drug therapy: Principal | ICD-10-CM

## 2020-08-28 ENCOUNTER — Other Ambulatory Visit (HOSPITAL_COMMUNITY): Payer: Self-pay

## 2020-08-28 ENCOUNTER — Ambulatory Visit
Admission: RE | Admit: 2020-08-28 | Discharge: 2020-08-28 | Disposition: A | Discharge status: Home / Self Care | Payer: 59 | Source: Ambulatory Visit | Attending: Family Medicine | Admitting: Family Medicine

## 2020-08-28 ENCOUNTER — Other Ambulatory Visit: Payer: Self-pay | Admitting: Family Medicine

## 2020-08-28 DIAGNOSIS — M5442 Lumbago with sciatica, left side: Secondary | ICD-10-CM

## 2020-08-28 DIAGNOSIS — M545 Low back pain, unspecified: Secondary | ICD-10-CM | POA: Diagnosis not present

## 2020-08-28 MED ORDER — OXYCODONE HCL 5 MG PO TABS
ORAL_TABLET | ORAL | 0 refills | Status: DC
  Filled 2020-08-28: qty 20, 5d supply, fill #0

## 2020-08-29 ENCOUNTER — Ambulatory Visit: Payer: 59 | Attending: Family Medicine | Admitting: Physical Therapy

## 2020-08-29 ENCOUNTER — Other Ambulatory Visit: Payer: Self-pay

## 2020-08-29 DIAGNOSIS — M6281 Muscle weakness (generalized): Secondary | ICD-10-CM | POA: Diagnosis not present

## 2020-08-29 DIAGNOSIS — R2689 Other abnormalities of gait and mobility: Secondary | ICD-10-CM | POA: Insufficient documentation

## 2020-08-29 DIAGNOSIS — M62830 Muscle spasm of back: Secondary | ICD-10-CM | POA: Insufficient documentation

## 2020-08-29 DIAGNOSIS — M5416 Radiculopathy, lumbar region: Secondary | ICD-10-CM | POA: Insufficient documentation

## 2020-08-29 NOTE — Patient Instructions (Signed)
Access Code: [email or web address] URL: [email or web address] Date: 08/29/2020 Prepared by: Raeford Razor  Exercises Supine with Legs Supported at 90/90 - 1-2 x daily - 7 x weekly - 1 sets - 1 reps - 5-10 min ? hold Supine Sciatic Nerve Glide - 2-3 x daily - 7 x weekly - 2 sets - 10 reps - 10 hold Sidelying Quadratus Lumborum Stretch on Table - 1-2 x daily - 7 x weekly - 1 sets - 1-2 reps - 30 hold Standing Quadratus Lumborum Stretch with Doorway - 1-2 x daily - 7 x weekly - 1 sets - 3 reps - 30 hold

## 2020-08-30 NOTE — Therapy (Signed)
[place], [place], [address] Phone: [phone]   Fax:  [phone]  Physical Therapy Evaluation  Patient Details  Name: Christine Butler MRN: [id number] Date of Birth: [birth year] Referring Provider (PT): Leighton Ruff, MD   Encounter Date: 08/29/2020   PT End of Session - 08/29/20 1643     Visit Number 1    Number of Visits 16    Date for PT Re-Evaluation 10/24/20    PT Start Time 6767    PT Stop Time 1620    PT Time Calculation (min) 47 min    Activity Tolerance Patient tolerated treatment well;Patient limited by pain    Behavior During Therapy [facility] for tasks assessed/performed             Past Medical History:  Diagnosis Date   (HFpEF) heart failure with preserved ejection fraction ([place]) 08/09/2019   Acute blood loss anemia 04/20/2017   Altered mental status 08/14/2019   Arthritis    right knee   Ascites--mild this admit 11/06/2017   Atypical chest pain 11/05/2017   Atypical squamous cells of undetermined significance (ASCUS) on Papanicolaou smear of cervix 08/05/2018   CAD (coronary artery disease)    a. s/p CABGx2 in 02/2017 (LAD not suitable for PCI), EF normal.   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   Chronic low back pain    Cirrhosis ([place]) 03/16/2017   Coronary artery disease 03/24/2017   Current episode of major depressive disorder without prior episode 03/13/2020   Dyslipidemia, goal LDL below 70 10/13/2017   Elevated LFTs 04/20/2017   Encephalopathy 09/29/2019   hepatic encephalopathy   Familial hyperlipidemia    GERD (gastroesophageal reflux disease)    GI bleed 02/01/2018   H/O LEEP 03/30/2019   H/O two vessel coronary artery bypass graft 03/30/2019   High grade squamous intraepithelial lesion (HGSIL) on cytologic smear of cervix 05/04/2019   Hypertension    Hypo-osmolality and hyponatremia 03/30/2019   IBS  (irritable bowel syndrome)    Other insomnia 09/05/2019   Pancytopenia ([place]) 09/28/2019   PONV (postoperative nausea and vomiting)    i always throw up on waking up , but last EGD in march had no issues     PONV (postoperative nausea and vomiting)    likes zofran and steroid to help does not like scopolamine patch   Port-A-Cath in place 11/21/2019   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 IMOUPDATE   Primary biliary cirrhosis ([place])    cirhosis/liver disease followed by transplant team led by Dr Manuella Ghazi at [place]    S/P CABG (coronary artery bypass graft)    S/P CABG x 2 03/24/2017   LIMA to DIAGONAL Portion of SVG/LEFT RADIAL to LAD   S/P LEEP (loop electrosurgical excision procedure) 05/11/2019   Status post liver transplantation ([place]) 03/30/2019   SVD (spontaneous vaginal delivery)    x 3   Upper GI bleed 02/01/2018   Urinary incontinence    occ   Urinary tract bacterial infections last oct 2021   Wears glasses     Past Surgical History:  Procedure Laterality Date   CERVICAL CONIZATION W/BX N/A 03/22/2020   Procedure: CONIZATION CERVIX WITH BIOPSY;  Surgeon: Dian Queen, MD;  Location: [facility];  Service: Gynecology;  Laterality: N/A;   CHOLECYSTECTOMY  09/2019   COLPOSCOPY N/A 03/22/2020  Procedure: COLPOSCOPY;  Surgeon: Dian Queen, MD;  Location: [facility];  Service: Gynecology;  Laterality: N/A;   CORONARY ARTERY BYPASS GRAFT N/A 03/24/2017   Procedure: CORONARY ARTERY BYPASS GRAFTING (CABG) x two, using left internal mammary artery, left radial artery, and right leg greater saphenous vein harvested endoscopically;  Surgeon: Ivin Poot, MD;  Location: [place];  Service: Open Heart Surgery;  Laterality: N/A;   ENDOVEIN HARVEST OF GREATER SAPHENOUS VEIN Right 03/24/2017   Procedure: ENDOVEIN HARVEST OF GREATER SAPHENOUS VEIN;  Surgeon: Ivin Poot, MD;  Location: [place];  Service: Open Heart Surgery;  Laterality: Right;   ESOPHAGEAL BANDING  02/01/2018   Procedure: ESOPHAGEAL BANDING;  Surgeon: [name], MD;  [facility];  Service: Gastroenterology;;   ESOPHAGEAL BANDING N/A 03/29/2018   Procedure: ESOPHAGEAL BANDING;  Surgeon: [name], MD;  Location: [facility];  Service: Gastroenterology;  Laterality: N/A;   ESOPHAGEAL BANDING N/A 05/21/2018   Procedure: ESOPHAGEAL BANDING;  Surgeon: [name], MD;  Location: [facility];  Service: Gastroenterology;  Laterality: N/A;   ESOPHAGEAL BANDING N/A 10/11/2018   Procedure: ESOPHAGEAL BANDING;  Surgeon: [name], MD;  Location: [facility];  Service: Gastroenterology;  Laterality: N/A;   ESOPHAGOGASTRODUODENOSCOPY N/A 03/29/2018   Procedure: ESOPHAGOGASTRODUODENOSCOPY (EGD);  Surgeon: [name], MD;  Location: [facility];  Service: Gastroenterology;  Laterality: N/A;   ESOPHAGOGASTRODUODENOSCOPY N/A 05/21/2018   Procedure: ESOPHAGOGASTRODUODENOSCOPY (EGD);  Surgeon: [name], MD;  Location: [facility];  Service: Gastroenterology;  Laterality: N/A;   ESOPHAGOGASTRODUODENOSCOPY (EGD) WITH PROPOFOL N/A 02/01/2018   Procedure: ESOPHAGOGASTRODUODENOSCOPY (EGD) WITH PROPOFOL;  Surgeon: [name], MD;  [facility];  Service: Gastroenterology;  Laterality: N/A;   ESOPHAGOGASTRODUODENOSCOPY (EGD) WITH PROPOFOL N/A 10/11/2018   Procedure: ESOPHAGOGASTRODUODENOSCOPY (EGD) WITH PROPOFOL;  Surgeon: [name], MD;  Location: [facility];  Service: Gastroenterology;  Laterality: N/A;   INCONTINENCE SURGERY  2009   urinary  bladder sling   IR IMAGING GUIDED PORT INSERTION  04/08/2019   LEEP N/A 03/28/2014   Procedure: LOOP ELECTROSURGICAL EXCISION PROCEDURE (LEEP) cone biopsy;  Surgeon: Cyril Mourning, MD;  Location: [facility];  Service: Gynecology;  Laterality: N/A;   LEEP N/A 03/22/2020   Procedure: LOOP ELECTROSURGICAL EXCISION PROCEDURE (LEEP);  Surgeon: Dian Queen, MD;  Location:  [facility];  Service: Gynecology;  Laterality: N/A;   LEFT HEART CATH AND CORONARY ANGIOGRAPHY N/A 03/23/2017   Procedure: LEFT HEART CATH AND CORONARY ANGIOGRAPHY;  Surgeon: Jettie Booze, MD;  Location: [facility];  Service: Cardiovascular;  Laterality: N/A;   LIVER BIOPSY     x 2   LIVER TRANSPLANT  08/ 24/2021   RADIAL ARTERY HARVEST Left 03/24/2017   Procedure: RADIAL ARTERY HARVEST;  Surgeon: Ivin Poot, MD;  Location: [facility];  Service: Open Heart Surgery;  Laterality: Left;   TEE WITHOUT CARDIOVERSION N/A 03/24/2017   Procedure: TRANSESOPHAGEAL ECHOCARDIOGRAM (TEE);  Surgeon: Prescott Gum, Collier Salina, MD;  Location: [facility];  Service: Open Heart Surgery;  Laterality: N/A;   WISDOM TOOTH EXTRACTION  yrs ago    There were no vitals filed for this visit.    Subjective Assessment - 08/29/20 1553     Subjective Patient has a history of intermittent sciatica over the years.   About 2 weeks ago she developed increased LE pain and difficulty weightbearing, walking.  She states this feels different than it has before.  Her pain is not responding to her stretches ("nothing is touching it") and  OTC meds. She has difficulty with transfers, walking and putting weight on her LLE.   She is continuing to work through severe pain .  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    Pertinent History Liver transplant 09/2019  chronic sciatica, CABG 2019    Limitations Lifting;Standing;Walking;House hold activities    How long can you stand comfortably? not at all    How long can you walk comfortably? not at all    Diagnostic tests XR done , no MRI    Patient Stated Goals Patient would to move and have less pain    Currently in Pain? Yes    Pain Score 5    sitting 3/10   Pain Location Back    Pain Orientation Left;Lower    Pain Descriptors / Indicators Aching;Sharp    Pain Type Acute pain    Pain Radiating Towards thigh , not below knee     Pain Onset 1 to 4 weeks ago    Pain Frequency Constant    Aggravating Factors  walking, standing    Pain Relieving Factors sitting, thus far nothing really    Effect of Pain on Daily Activities hard to walk, work    Multiple Pain Sites No                [facility] PT Assessment - 08/30/20 0001       Assessment   Medical Diagnosis Sciatica    Referring Provider (PT) Leighton Ruff, MD    Onset Date/Surgical Date 08/17/20    Prior Therapy Yes      Precautions   Precautions None      Restrictions   Weight Bearing Restrictions No      Balance Screen   Has the patient fallen in the past 6 months No      [place] residence    Living Arrangements Spouse/significant other    Type of [place] to enter    Home Layout Two level    [place] - 4 wheels      Prior Function   Level of Independence Independent    Vocation Full time employment    Leisure life is so different after transplant, dog, beach      Cognition   Overall Cognitive Status Within Functional Limits for tasks assessed      Observation/Other Assessments   Focus on Therapeutic Outcomes (FOTO)  37%      Sensation   Light Touch Appears Intact      Coordination   Gross Motor Movements are Fluid and Coordinated Not tested      Posture/Postural Control   Posture/Postural Control Postural limitations    Postural Limitations Rounded Shoulders;Forward head;Weight shift right      AROM   Lumbar Flexion 50% pain    Lumbar Extension 50% no pain    Lumbar - Right Side Bend WFL    Lumbar - Left Side Bend pinch L side 50%    Lumbar - Right Rotation WFL    Lumbar - Left Rotation [facility]      Strength   Right Hip Flexion 4+/5    Left Hip Flexion 4/5    Left Hip ABduction 3-/5   pain   Right Knee Flexion 5/5    Right Knee Extension 5/5    Left Knee Flexion 4/5    Left Knee Extension 4/5  Right Ankle Dorsiflexion 4+/5    Left Ankle  Dorsiflexion 4+/5      Palpation   Palpation comment sore along L5 and lateral SI border      Special Tests    Special Tests Lumbar    Lumbar Tests Slump Test;Straight Leg Raise      Slump test   Findings Positive    Side Left      Straight Leg Raise   Findings Positive    Side  Right    Comment post contralateral as well      Transfers   Five time sit to stand comments  38 sec with UE assist      Ambulation/Gait   Ambulation Distance (Feet) 125 Feet    Gait Pattern Decreased stance time - left;Antalgic                        Objective measurements completed on examination: See above findings.       [facility] Adult PT Treatment/Exercise - 08/30/20 0001       Self-Care   Self-Care Other Self-Care Comments;Heat/Ice Application    Heat/Ice Application ice preferred    Other Self-Care Comments  HEP, positioning 90/90, radiculopathy vs sciatica      Manual Therapy   Manual therapy comments long axis distraction LLE did provide relief                    PT Education - 08/29/20 1635     Education Details PT/POC, sciatica vs lumbar radiculopathy, HEP, body mechanics    Person(s) Educated Patient    Methods Explanation    Comprehension Verbalized understanding;Returned demonstration              PT Short Term Goals - 08/30/20 1043       PT SHORT TERM GOAL #1   Title pt to be IND with initial HEP    Time 4    Period Weeks    Status New    Target Date 09/27/20      PT SHORT TERM GOAL #2   Title Pt will be able to transition on and off mat (sit to supine to sit ) with min increase in pain .    Time 4    Period Weeks    Status New    Target Date 09/27/20      PT SHORT TERM GOAL #3   Title increase 5 x sit to stand  to 20 sec or better  to work toward functional / safe transitions    Time 4    Period Weeks    Status New    Target Date 09/27/20      PT SHORT TERM GOAL #4   Title FOTO will be discussed with patient to inform about  potential to improve her condition.    Time 4    Period Weeks    Status New    Target Date 09/27/20               PT Long Term Goals - 08/30/20 1045       PT LONG TERM GOAL #1   Title Pt will be able to walk without noticeable limp while in the clinic.    Time 8    Period Weeks    Status New    Target Date 10/25/20      PT LONG TERM GOAL #2   Title Pt will be able to improve core strength  to be able to perform lower abdominal exercises without increased pain and loss of neutral spine    Time 8    Period Weeks    Status New    Target Date 10/25/20      PT LONG TERM GOAL #3   Title Pt will be  with HEP for core , trunk, hips    Time 8    Period Weeks    Status New    Target Date 10/25/20      PT LONG TERM GOAL #4   Title Patient will score 66% or better on FOTO to demo improved functional mobility for work, home tasks    Time 8    Period Weeks    Status New    Target Date 10/25/20      PT LONG TERM GOAL #5   Title Pt will be able to safely lift, show good body mechanics to aid in prevention of recurring back pain at work    Time 8    Period Weeks    Status New    Target Date 10/25/20                    Plan - 08/30/20 1052     Personal Factors and Comorbidities Comorbidity 3+;Past/Current Experience;Profession    Comorbidities liver transplant, cardiac surgery, recurring sciatica    Examination-Activity Limitations Bathing;Squat;Bed Mobility;Lift;Stairs;Bend;Locomotion Level;Stand;Transfers;Sit;Sleep;Dressing    Examination-Participation Restrictions Cleaning;Community Activity;Driving;Interpersonal Relationship;Occupation    Stability/Clinical Decision Making Evolving/Moderate complexity    Clinical Decision Making Moderate    Rehab Potential Excellent    PT Frequency 2x / week    PT Duration 8 weeks    PT Treatment/Interventions ADLs/Self Care Home Management;Electrical Stimulation;Therapeutic activities;Cryotherapy;Traction;Moist  Heat;Therapeutic exercise;Balance training;Functional mobility training;Neuromuscular re-education;Patient/family education;Manual techniques;Dry needling;Taping;Spinal Manipulations    PT Next Visit Plan HEP, stabilization, traction    PT Home Exercise Plan [email or web address]    Recommended Other Services Orthopedic    Consulted and Agree with Plan of Care Patient             Patient will benefit from skilled therapeutic intervention in order to improve the following deficits and impairments:  Decreased activity tolerance, Decreased strength, Increased fascial restricitons, Impaired flexibility, Postural dysfunction, Pain, Decreased range of motion, Difficulty walking, Decreased mobility, Decreased balance, Abnormal gait, Other (comment) (neural tension)  Visit Diagnosis: Muscle spasm of back  Muscle weakness (generalized)  Other abnormalities of gait and mobility  Radiculopathy, lumbar region     Problem List Patient Active Problem List   Diagnosis Date Noted   Chronic low back pain    Hypertension    Urinary incontinence    Wears glasses    Current episode of major depressive disorder without prior episode 03/13/2020   S/P CABG (coronary artery bypass graft)    Port-A-Cath in place 11/21/2019   Encephalopathy 09/29/2019   Pancytopenia ([place]) 09/28/2019   Arthritis 09/21/2019   PONV (postoperative nausea and vomiting) 09/21/2019   SVD (spontaneous vaginal delivery) 09/21/2019   Urinary tract bacterial infections 09/21/2019   CAD (coronary artery disease)    Other insomnia 09/05/2019   Catatonia 08/18/2019   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   Altered mental status 08/14/2019   (HFpEF) heart failure with preserved ejection fraction ([place]) 08/09/2019   S/P LEEP (loop electrosurgical excision procedure) 05/11/2019   High grade squamous intraepithelial lesion (HGSIL) on cytologic smear of cervix 05/04/2019   Familial hyperlipidemia 03/30/2019   H/O two vessel coronary artery bypass  graft 03/30/2019   Status  post liver transplantation ([place]) 03/30/2019   Hypo-osmolality and hyponatremia 03/30/2019   H/O LEEP 03/30/2019   Atypical squamous cells of undetermined significance (ASCUS) on Papanicolaou smear of cervix 08/05/2018   Upper GI bleed 02/01/2018   GI bleed 02/01/2018   Ascites--mild this admit 11/06/2017   Atypical chest pain 11/05/2017   Dyslipidemia, goal LDL below 70 10/13/2017   Acute blood loss anemia 04/20/2017   Elevated LFTs 04/20/2017   Coronary artery disease 03/24/2017   S/P CABG x 2 03/24/2017   CAD (coronary artery disease), native coronary artery 03/23/2017   Cirrhosis ([place]) 03/16/2017   GERD (gastroesophageal reflux disease) 01/28/2012   Primary biliary cirrhosis ([place]) 01/28/2012   IBS (irritable bowel syndrome) 01/28/2012   Primary biliary cholangitis ([place]) 01/28/2012    Christine Butler 08/30/2020, 12:19 PM  [facility] [address] [place], [place], [address] Phone: [phone]   Fax:  [phone]  Name: Christine Butler MRN: [id number] Date of Birth: November 17, 1970  Raeford Razor, PT 08/30/20 12:19 PM Phone: [phone] Fax: [phone]

## 2020-09-03 DIAGNOSIS — Z79899 Other long term (current) drug therapy: Principal | ICD-10-CM

## 2020-09-03 DIAGNOSIS — Z944 Liver transplant status: Principal | ICD-10-CM

## 2020-09-06 ENCOUNTER — Ambulatory Visit: Payer: 59

## 2020-09-06 ENCOUNTER — Other Ambulatory Visit: Payer: Self-pay

## 2020-09-06 DIAGNOSIS — M5416 Radiculopathy, lumbar region: Secondary | ICD-10-CM

## 2020-09-06 DIAGNOSIS — R2689 Other abnormalities of gait and mobility: Secondary | ICD-10-CM

## 2020-09-06 DIAGNOSIS — M62830 Muscle spasm of back: Secondary | ICD-10-CM

## 2020-09-06 DIAGNOSIS — M6281 Muscle weakness (generalized): Secondary | ICD-10-CM | POA: Diagnosis not present

## 2020-09-07 DIAGNOSIS — Z944 Liver transplant status: Principal | ICD-10-CM

## 2020-09-07 NOTE — Therapy (Signed)
[place], [place], [address] Phone: [phone]   Fax:  [phone]  Physical Therapy Treatment  Patient Details  Name: Christine Butler MRN: [id number] Date of Birth: [birth year] Referring Provider (PT): Leighton Ruff, MD   Encounter Date: 09/06/2020   PT End of Session - 09/07/20 0924     Visit Number 2    Number of Visits 16    Date for PT Re-Evaluation 10/24/20    PT Start Time 3846    PT Stop Time 1905    PT Time Calculation (min) 35 min    Activity Tolerance Patient tolerated treatment well;Patient limited by pain    Behavior During Therapy [facility] for tasks assessed/performed             Past Medical History:  Diagnosis Date   (HFpEF) heart failure with preserved ejection fraction ([place]) 08/09/2019   Acute blood loss anemia 04/20/2017   Altered mental status 08/14/2019   Arthritis    right knee   Ascites--mild this admit 11/06/2017   Atypical chest pain 11/05/2017   Atypical squamous cells of undetermined significance (ASCUS) on Papanicolaou smear of cervix 08/05/2018   CAD (coronary artery disease)    a. s/p CABGx2 in 02/2017 (LAD not suitable for PCI), EF normal.   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   Chronic low back pain    Cirrhosis ([place]) 03/16/2017   Coronary artery disease 03/24/2017   Current episode of major depressive disorder without prior episode 03/13/2020   Dyslipidemia, goal LDL below 70 10/13/2017   Elevated LFTs 04/20/2017   Encephalopathy 09/29/2019   hepatic encephalopathy   Familial hyperlipidemia    GERD (gastroesophageal reflux disease)    GI bleed 02/01/2018   H/O LEEP 03/30/2019   H/O two vessel coronary artery bypass graft 03/30/2019   High grade squamous intraepithelial lesion (HGSIL) on cytologic smear of cervix 05/04/2019   Hypertension    Hypo-osmolality and hyponatremia 03/30/2019   IBS  (irritable bowel syndrome)    Other insomnia 09/05/2019   Pancytopenia ([place]) 09/28/2019   PONV (postoperative nausea and vomiting)    i always throw up on waking up , but last EGD in march had no issues     PONV (postoperative nausea and vomiting)    likes zofran and steroid to help does not like scopolamine patch   Port-A-Cath in place 11/21/2019   Primary biliary cholangitis ([place]) 07/26/2012   Formatting of this note might be different from the original. IMOUPDATE   Primary biliary cirrhosis ([place])    cirhosis/liver disease followed by transplant team led by Dr Manuella Ghazi at [place]    S/P CABG (coronary artery bypass graft)    S/P CABG x 2 03/24/2017   LIMA to DIAGONAL Portion of SVG/LEFT RADIAL to LAD   S/P LEEP (loop electrosurgical excision procedure) 05/11/2019   Status post liver transplantation ([place]) 03/30/2019   SVD (spontaneous vaginal delivery)    x 3   Upper GI bleed 02/01/2018   Urinary incontinence    occ   Urinary tract bacterial infections last oct 2021   Wears glasses     Past Surgical History:  Procedure Laterality Date   CERVICAL CONIZATION W/BX N/A 03/22/2020   Procedure: CONIZATION CERVIX WITH BIOPSY;  Surgeon: Dian Queen, MD;  Location: [place];  Service: Gynecology;  Laterality: N/A;   CHOLECYSTECTOMY  09/2019   COLPOSCOPY N/A 03/22/2020  Procedure: COLPOSCOPY;  Surgeon: Dian Queen, MD;  Location: [facility];  Service: Gynecology;  Laterality: N/A;   CORONARY ARTERY BYPASS GRAFT N/A 03/24/2017   Procedure: CORONARY ARTERY BYPASS GRAFTING (CABG) x two, using left internal mammary artery, left radial artery, and right leg greater saphenous vein harvested endoscopically;  Surgeon: Ivin Poot, MD;  Location: [place];  Service: Open Heart Surgery;  Laterality: N/A;   ENDOVEIN HARVEST OF GREATER SAPHENOUS VEIN Right 03/24/2017   Procedure: ENDOVEIN HARVEST OF GREATER SAPHENOUS VEIN;  Surgeon: Ivin Poot, MD;  Location: [place];  Service: Open Heart Surgery;  Laterality: Right;   ESOPHAGEAL BANDING  02/01/2018   Procedure: ESOPHAGEAL BANDING;  Surgeon: [name], MD;  Location: [facility];  Service: Gastroenterology;;   ESOPHAGEAL BANDING N/A 03/29/2018   Procedure: ESOPHAGEAL BANDING;  Surgeon: [name], MD;  Location: [facility];  Service: Gastroenterology;  Laterality: N/A;   ESOPHAGEAL BANDING N/A 05/21/2018   Procedure: ESOPHAGEAL BANDING;  Surgeon: [name], MD;  Location: [facility];  Service: Gastroenterology;  Laterality: N/A;   ESOPHAGEAL BANDING N/A 10/11/2018   Procedure: ESOPHAGEAL BANDING;  Surgeon: [name], MD;  Location: [facility];  Service: Gastroenterology;  Laterality: N/A;   ESOPHAGOGASTRODUODENOSCOPY N/A 03/29/2018   Procedure: ESOPHAGOGASTRODUODENOSCOPY (EGD);  Surgeon: [name], MD;  Location: [facility];  Service: Gastroenterology;  Laterality: N/A;   ESOPHAGOGASTRODUODENOSCOPY N/A 05/21/2018   Procedure: ESOPHAGOGASTRODUODENOSCOPY (EGD);  Surgeon: [name], MD;  Location: [facility];  Service: Gastroenterology;  Laterality: N/A;   ESOPHAGOGASTRODUODENOSCOPY (EGD) WITH PROPOFOL N/A 02/01/2018   Procedure: ESOPHAGOGASTRODUODENOSCOPY (EGD) WITH PROPOFOL;  Surgeon: [name], MD;  Location: [facility];  Service: Gastroenterology;  Laterality: N/A;   ESOPHAGOGASTRODUODENOSCOPY (EGD) WITH PROPOFOL N/A 10/11/2018   Procedure: ESOPHAGOGASTRODUODENOSCOPY (EGD) WITH PROPOFOL;  Surgeon: [name], MD;  Location: [facility];  Service: Gastroenterology;  Laterality: N/A;   INCONTINENCE SURGERY  2009   urinary  bladder sling   IR IMAGING GUIDED PORT INSERTION  04/08/2019   LEEP N/A 03/28/2014   Procedure: LOOP ELECTROSURGICAL EXCISION PROCEDURE (LEEP) cone biopsy;  Surgeon: Cyril Mourning, MD;  Location: [facility];  Service: Gynecology;  Laterality: N/A;   LEEP N/A 03/22/2020   Procedure: LOOP ELECTROSURGICAL EXCISION PROCEDURE (LEEP);  Surgeon: Dian Queen, MD;  Location:  [facility];  Service: Gynecology;  Laterality: N/A;   LEFT HEART CATH AND CORONARY ANGIOGRAPHY N/A 03/23/2017   Procedure: LEFT HEART CATH AND CORONARY ANGIOGRAPHY;  Surgeon: Jettie Booze, MD;  Location: [facility];  Service: Cardiovascular;  Laterality: N/A;   LIVER BIOPSY     x 2   LIVER TRANSPLANT  08/ 24/2021   RADIAL ARTERY HARVEST Left 03/24/2017   Procedure: RADIAL ARTERY HARVEST;  Surgeon: Ivin Poot, MD;  Location: [place];  Service: Open Heart Surgery;  Laterality: Left;   TEE WITHOUT CARDIOVERSION N/A 03/24/2017   Procedure: TRANSESOPHAGEAL ECHOCARDIOGRAM (TEE);  Surgeon: Prescott Gum, Collier Salina, MD;  Location: [place] [facility];  Service: Open Heart Surgery;  Laterality: N/A;   WISDOM TOOTH EXTRACTION  yrs ago    There were no vitals filed for this visit.   Subjective Assessment - 09/07/20 0924     Subjective Pt presents to PT with reports of decreased L LE pain and paresthesias. Pt has been compliant with her HEP with no adverse effect. She is ready to begin PT treatment at this time.    Currently in Pain? Yes    Pain Score 2  Pain Location Leg    Pain Orientation Left;Posterior                               [facility] Adult PT Treatment/Exercise - 09/07/20 0001       Exercises   Exercises Lumbar      Lumbar Exercises: Stretches   Figure 4 Stretch Limitations 2x30 sec seated    Other Lumbar Stretch Exercise seated sciatic nerve glide x 15    Other Lumbar Stretch Exercise supine sciatic nerve 2x15 L LE      Lumbar Exercises: Standing   Other Standing Lumbar Exercises standing QL stretch 2x30 sec ea      Lumbar Exercises: Seated   Sit to Stand Limitations x 10      Lumbar Exercises: Supine   Pelvic Tilt 2 reps;10 reps;5 seconds    Pelvic Tilt Limitations PPT w/ ball squeeze 2x10 - 5 sec hold    Bridge Limitations 2x10 - 3 sec hold      Manual Therapy   Manual therapy comments tennis ball massage and trigger point release to L  piriformis                      PT Short Term Goals - 08/30/20 1043       PT SHORT TERM GOAL #1   Title pt to be IND with initial HEP    Time 4    Period Weeks    Status New    Target Date 09/27/20      PT SHORT TERM GOAL #2   Title Pt will be able to transition on and off mat (sit to supine to sit ) with min increase in pain .    Time 4    Period Weeks    Status New    Target Date 09/27/20      PT SHORT TERM GOAL #3   Title increase 5 x sit to stand  to 20 sec or better  to work toward functional / safe transitions    Time 4    Period Weeks    Status New    Target Date 09/27/20      PT SHORT TERM GOAL #4   Title FOTO will be discussed with patient to inform about potential to improve her condition.    Time 4    Period Weeks    Status New    Target Date 09/27/20               PT Long Term Goals - 08/30/20 1045       PT LONG TERM GOAL #1   Title Pt will be able to walk without noticeable limp while in the clinic.    Time 8    Period Weeks    Status New    Target Date 10/25/20      PT LONG TERM GOAL #2   Title Pt will be able to improve core strength to be able to perform lower abdominal exercises without increased pain and loss of neutral spine    Time 8    Period Weeks    Status New    Target Date 10/25/20      PT LONG TERM GOAL #3   Title Pt will be  with HEP for core , trunk, hips    Time 8    Period Weeks    Status New    Target Date 10/25/20  PT LONG TERM GOAL #4   Title Patient will score 66% or better on FOTO to demo improved functional mobility for work, home tasks    Time 8    Period Weeks    Status New    Target Date 10/25/20      PT LONG TERM GOAL #5   Title Pt will be able to safely lift, show good body mechanics to aid in prevention of recurring back pain at work    Time 8    Period Weeks    Status New    Target Date 10/25/20                   Plan - 09/07/20 0924     Clinical Impression Statement  Pt was able to complete prescribed exercises with no adverse effect or increase in pain. Today's session focused on continued decrease in neural tension and improving core endurance. Pt continues to benefit from skilled PT and will continue to be seen and progressed pe rPOC.    PT Treatment/Interventions ADLs/Self Care Home Management;Electrical Stimulation;Therapeutic activities;Cryotherapy;Traction;Moist Heat;Therapeutic exercise;Balance training;Functional mobility training;Neuromuscular re-education;Patient/family education;Manual techniques;Dry needling;Taping;Spinal Manipulations    PT Next Visit Plan HEP, stabilization, traction    PT Home Exercise Plan [email or web address]             Patient will benefit from skilled therapeutic intervention in order to improve the following deficits and impairments:  Decreased activity tolerance, Decreased strength, Increased fascial restricitons, Impaired flexibility, Postural dysfunction, Pain, Decreased range of motion, Difficulty walking, Decreased mobility, Decreased balance, Abnormal gait, Other (comment)  Visit Diagnosis: Muscle spasm of back  Muscle weakness (generalized)  Other abnormalities of gait and mobility  Radiculopathy, lumbar region     Problem List Patient Active Problem List   Diagnosis Date Noted   Chronic low back pain    Hypertension    Urinary incontinence    Wears glasses    Current episode of major depressive disorder without prior episode 03/13/2020   S/P CABG (coronary artery bypass graft)    Port-A-Cath in place 11/21/2019   Encephalopathy 09/29/2019   Pancytopenia ([place]) 09/28/2019   Arthritis 09/21/2019   PONV (postoperative nausea and vomiting) 09/21/2019   SVD (spontaneous vaginal delivery) 09/21/2019   Urinary tract bacterial infections 09/21/2019   CAD (coronary artery disease)    Other insomnia 09/05/2019   Catatonia 08/18/2019   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   Altered mental status 08/14/2019   (HFpEF)  heart failure with preserved ejection fraction ([place]) 08/09/2019   S/P LEEP (loop electrosurgical excision procedure) 05/11/2019   High grade squamous intraepithelial lesion (HGSIL) on cytologic smear of cervix 05/04/2019   Familial hyperlipidemia 03/30/2019   H/O two vessel coronary artery bypass graft 03/30/2019   Status post liver transplantation ([place]) 03/30/2019   Hypo-osmolality and hyponatremia 03/30/2019   H/O LEEP 03/30/2019   Atypical squamous cells of undetermined significance (ASCUS) on Papanicolaou smear of cervix 08/05/2018   Upper GI bleed 02/01/2018   GI bleed 02/01/2018   Ascites--mild this admit 11/06/2017   Atypical chest pain 11/05/2017   Dyslipidemia, goal LDL below 70 10/13/2017   Acute blood loss anemia 04/20/2017   Elevated LFTs 04/20/2017   Coronary artery disease 03/24/2017   S/P CABG x 2 03/24/2017   CAD (coronary artery disease), native coronary artery 03/23/2017   Cirrhosis ([place]) 03/16/2017   GERD (gastroesophageal reflux disease) 01/28/2012   Primary biliary cirrhosis ([place]) 01/28/2012   IBS (irritable bowel syndrome) 01/28/2012   Primary biliary cholangitis ([place])  01/28/2012    Ward Chatters, PT, DPT 09/07/20 9:28 AM  [facility] [address] [place], [place], [address] Phone: [phone]   Fax:  [phone]  Name: Christine Butler MRN: [id number] Date of Birth: 02-Oct-1970

## 2020-09-10 DIAGNOSIS — Z79899 Other long term (current) drug therapy: Principal | ICD-10-CM

## 2020-09-10 DIAGNOSIS — Z944 Liver transplant status: Principal | ICD-10-CM

## 2020-09-11 ENCOUNTER — Other Ambulatory Visit (HOSPITAL_COMMUNITY): Payer: Self-pay

## 2020-09-12 ENCOUNTER — Other Ambulatory Visit (HOSPITAL_COMMUNITY): Payer: Self-pay

## 2020-09-13 ENCOUNTER — Ambulatory Visit: Payer: 59

## 2020-09-13 ENCOUNTER — Other Ambulatory Visit (HOSPITAL_COMMUNITY)
Admission: AD | Admit: 2020-09-13 | Discharge: 2020-09-13 | Disposition: A | Discharge status: Home / Self Care | Payer: 59 | Source: Ambulatory Visit | Attending: Physician Assistant | Admitting: Physician Assistant

## 2020-09-13 ENCOUNTER — Other Ambulatory Visit (HOSPITAL_COMMUNITY): Payer: Self-pay

## 2020-09-13 DIAGNOSIS — Z79899 Other long term (current) drug therapy: Secondary | ICD-10-CM | POA: Diagnosis not present

## 2020-09-13 DIAGNOSIS — Z944 Liver transplant status: Secondary | ICD-10-CM | POA: Diagnosis not present

## 2020-09-13 LAB — CBC WITH DIFFERENTIAL/PLATELET
Abs Immature Granulocytes: 0.03 10*3/uL (ref 0.00–0.07)
Basophils Absolute: 0 10*3/uL (ref 0.0–0.1)
Basophils Relative: 1 %
Eosinophils Absolute: 0.1 10*3/uL (ref 0.0–0.5)
Eosinophils Relative: 3 %
HCT: 29.7 % — ABNORMAL LOW (ref 36.0–46.0)
Hemoglobin: 10.4 g/dL — ABNORMAL LOW (ref 12.0–15.0)
Immature Granulocytes: 1 %
Lymphocytes Relative: 26 %
Lymphs Abs: 1 10*3/uL (ref 0.7–4.0)
MCH: 34.1 pg — ABNORMAL HIGH (ref 26.0–34.0)
MCHC: 35 g/dL (ref 30.0–36.0)
MCV: 97.4 fL (ref 80.0–100.0)
Monocytes Absolute: 0.4 10*3/uL (ref 0.1–1.0)
Monocytes Relative: 10 %
Neutro Abs: 2.4 10*3/uL (ref 1.7–7.7)
Neutrophils Relative %: 59 %
Platelets: 150 10*3/uL (ref 150–400)
RBC: 3.05 MIL/uL — ABNORMAL LOW (ref 3.87–5.11)
RDW: 13.2 % (ref 11.5–15.5)
WBC: 4 10*3/uL (ref 4.0–10.5)
nRBC: 0 % (ref 0.0–0.2)

## 2020-09-13 LAB — COMPREHENSIVE METABOLIC PANEL
ALT: 21 U/L (ref 0–44)
AST: 21 U/L (ref 15–41)
Albumin: 3.6 g/dL (ref 3.5–5.0)
Alkaline Phosphatase: 109 U/L (ref 38–126)
Anion gap: 9 (ref 5–15)
BUN: 30 mg/dL — ABNORMAL HIGH (ref 6–20)
CO2: 21 mmol/L — ABNORMAL LOW (ref 22–32)
Calcium: 9.3 mg/dL (ref 8.9–10.3)
Chloride: 110 mmol/L (ref 98–111)
Creatinine, Ser: 1.12 mg/dL — ABNORMAL HIGH (ref 0.44–1.00)
GFR, Estimated: 60 mL/min — ABNORMAL LOW (ref >60)
Glucose, Bld: 136 mg/dL — ABNORMAL HIGH (ref 70–99)
Potassium: 4.2 mmol/L (ref 3.5–5.1)
Sodium: 140 mmol/L (ref 135–145)
Total Bilirubin: 0.9 mg/dL (ref 0.3–1.2)
Total Protein: 6.4 g/dL — ABNORMAL LOW (ref 6.5–8.1)

## 2020-09-13 LAB — GAMMA GT: GGT: 15 U/L (ref 7–50)

## 2020-09-13 LAB — BILIRUBIN, DIRECT: Bilirubin, Direct: <0.1 mg/dL (ref 0.0–0.2)

## 2020-09-13 LAB — MAGNESIUM: Magnesium: 1.5 mg/dL — ABNORMAL LOW (ref 1.7–2.4)

## 2020-09-13 LAB — PHOSPHORUS: Phosphorus: 4.5 mg/dL (ref 2.5–4.6)

## 2020-09-15 LAB — TACROLIMUS LEVEL: Tacrolimus (FK506) - LabCorp: 4.7 ng/mL (ref 2.0–20.0)

## 2020-09-17 DIAGNOSIS — Z944 Liver transplant status: Principal | ICD-10-CM

## 2020-09-17 DIAGNOSIS — U071 COVID: Principal | ICD-10-CM

## 2020-09-17 DIAGNOSIS — Z79899 Other long term (current) drug therapy: Principal | ICD-10-CM

## 2020-09-18 ENCOUNTER — Telehealth: Payer: Self-pay

## 2020-09-18 ENCOUNTER — Ambulatory Visit: Payer: 59

## 2020-09-18 DIAGNOSIS — U071 COVID-19: Principal | ICD-10-CM

## 2020-09-18 HISTORY — DX: COVID-19: U07.1

## 2020-09-18 NOTE — Telephone Encounter (Signed)
Called and spoke with patient regarding missed visit. Patient has Covid-19 and forgot to call and cancel.   Cancelled next visit due to CDC recommended return time. Review next appointment time on 09/24/2020.  Ward Chatters, PT, DPT 09/18/20 6:10 PM

## 2020-09-19 ENCOUNTER — Ambulatory Visit: Admit: 2020-09-19 | Discharge: 2020-09-19 | Payer: PRIVATE HEALTH INSURANCE

## 2020-09-19 DIAGNOSIS — U071 COVID-19: Principal | ICD-10-CM

## 2020-09-19 DIAGNOSIS — Z944 Liver transplant status: Principal | ICD-10-CM

## 2020-09-19 DIAGNOSIS — Z23 Encounter for immunization: Secondary | ICD-10-CM | POA: Diagnosis not present

## 2020-09-20 ENCOUNTER — Ambulatory Visit: Payer: 59 | Admitting: Physical Therapy

## 2020-09-24 ENCOUNTER — Ambulatory Visit: Payer: 59

## 2020-09-24 DIAGNOSIS — Z944 Liver transplant status: Principal | ICD-10-CM

## 2020-09-24 DIAGNOSIS — Z79899 Other long term (current) drug therapy: Principal | ICD-10-CM

## 2020-09-26 ENCOUNTER — Other Ambulatory Visit: Payer: Self-pay

## 2020-09-26 ENCOUNTER — Ambulatory Visit: Payer: 59 | Attending: Family Medicine | Admitting: Physical Therapy

## 2020-09-26 ENCOUNTER — Encounter: Payer: Self-pay | Admitting: Physical Therapy

## 2020-09-26 DIAGNOSIS — M6281 Muscle weakness (generalized): Secondary | ICD-10-CM | POA: Insufficient documentation

## 2020-09-26 DIAGNOSIS — R2689 Other abnormalities of gait and mobility: Secondary | ICD-10-CM | POA: Diagnosis not present

## 2020-09-26 DIAGNOSIS — M62830 Muscle spasm of back: Secondary | ICD-10-CM | POA: Insufficient documentation

## 2020-09-26 NOTE — Therapy (Signed)
[place], [place], [address] Phone: [phone]   Fax:  [phone]  Physical Therapy Treatment  Patient Details  Name: Christine Butler MRN: [id number] Date of Birth: 1971/01/21 Referring Provider (PT): Leighton Ruff, MD   Encounter Date: 09/26/2020   PT End of Session - 09/26/20 1548     Visit Number 3    Number of Visits 16    Date for PT Re-Evaluation 10/24/20    PT Start Time [id number]    PT Stop Time 1625    PT Time Calculation (min) 39 min    Activity Tolerance Patient tolerated treatment well;Patient limited by pain    Behavior During Therapy [facility] for tasks assessed/performed             Past Medical History:  Diagnosis Date   (HFpEF) heart failure with preserved ejection fraction ([place]) 08/09/2019   Acute blood loss anemia 04/20/2017   Altered mental status 08/14/2019   Arthritis    right knee   Ascites--mild this admit 11/06/2017   Atypical chest pain 11/05/2017   Atypical squamous cells of undetermined significance (ASCUS) on Papanicolaou smear of cervix 08/05/2018   CAD (coronary artery disease)    a. s/p CABGx2 in 02/2017 (LAD not suitable for PCI), EF normal.   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   Chronic low back pain    Cirrhosis ([place]) 03/16/2017   Coronary artery disease 03/24/2017   Current episode of major depressive disorder without prior episode 03/13/2020   Dyslipidemia, goal LDL below 70 10/13/2017   Elevated LFTs 04/20/2017   Encephalopathy 09/29/2019   hepatic encephalopathy   Familial hyperlipidemia    GERD (gastroesophageal reflux disease)    GI bleed 02/01/2018   H/O LEEP 03/30/2019   H/O two vessel coronary artery bypass graft 03/30/2019   High grade squamous intraepithelial lesion (HGSIL) on cytologic smear of cervix 05/04/2019   Hypertension    Hypo-osmolality and hyponatremia 03/30/2019   IBS  (irritable bowel syndrome)    Other insomnia 09/05/2019   Pancytopenia ([place]) 09/28/2019   PONV (postoperative nausea and vomiting)    i always throw up on waking up , but last EGD in march had no issues     PONV (postoperative nausea and vomiting)    likes zofran and steroid to help does not like scopolamine patch   Port-A-Cath in place 11/21/2019   Primary biliary cholangitis ([place]) 07/26/2012   Formatting of this note might be different from the original. IMOUPDATE   Primary biliary cirrhosis ([place])    cirhosis/liver disease followed by transplant team led by Dr Manuella Ghazi at [place]    S/P CABG (coronary artery bypass graft)    S/P CABG x 2 03/24/2017   LIMA to DIAGONAL Portion of SVG/LEFT RADIAL to LAD   S/P LEEP (loop electrosurgical excision procedure) 05/11/2019   Status post liver transplantation ([place]) 03/30/2019   SVD (spontaneous vaginal delivery)    x 3   Upper GI bleed 02/01/2018   Urinary incontinence    occ   Urinary tract bacterial infections last oct 2021   Wears glasses     Past Surgical History:  Procedure Laterality Date   CERVICAL CONIZATION W/BX N/A 03/22/2020   Procedure: CONIZATION CERVIX WITH BIOPSY;  Surgeon: Dian Queen, MD;  Location: [facility];  Service: Gynecology;  Laterality: N/A;   CHOLECYSTECTOMY  09/2019   COLPOSCOPY N/A 03/22/2020  Procedure: COLPOSCOPY;  Surgeon: Dian Queen, MD;  Location: [facility];  Service: Gynecology;  Laterality: N/A;   CORONARY ARTERY BYPASS GRAFT N/A 03/24/2017   Procedure: CORONARY ARTERY BYPASS GRAFTING (CABG) x two, using left internal mammary artery, left radial artery, and right leg greater saphenous vein harvested endoscopically;  Surgeon: Ivin Poot, MD;  Location: [place];  Service: Open Heart Surgery;  Laterality: N/A;   ENDOVEIN HARVEST OF GREATER SAPHENOUS VEIN Right 03/24/2017   Procedure: ENDOVEIN HARVEST OF GREATER SAPHENOUS VEIN;  Surgeon: Ivin Poot, MD;  Location: [place];  Service: Open Heart Surgery;  Laterality: Right;   ESOPHAGEAL BANDING  02/01/2018   Procedure: ESOPHAGEAL BANDING;  Surgeon: [name], MD;  Location: [facility];  Service: Gastroenterology;;   ESOPHAGEAL BANDING N/A 03/29/2018   Procedure: ESOPHAGEAL BANDING;  Surgeon: [name], MD;  Location: [facility];  Service: Gastroenterology;  Laterality: N/A;   ESOPHAGEAL BANDING N/A 05/21/2018   Procedure: ESOPHAGEAL BANDING;  Surgeon: [name], MD;  Location: [facility];  Service: Gastroenterology;  Laterality: N/A;   ESOPHAGEAL BANDING N/A 10/11/2018   Procedure: ESOPHAGEAL BANDING;  Surgeon: [name], MD;  Location: [facility];  Service: Gastroenterology;  Laterality: N/A;   ESOPHAGOGASTRODUODENOSCOPY N/A 03/29/2018   Procedure: ESOPHAGOGASTRODUODENOSCOPY (EGD);  Surgeon: [name], MD;  Location: [facility];  Service: Gastroenterology;  Laterality: N/A;   ESOPHAGOGASTRODUODENOSCOPY N/A 05/21/2018   Procedure: ESOPHAGOGASTRODUODENOSCOPY (EGD);  Surgeon: [name], MD;  Location: [facility];  Service: Gastroenterology;  Laterality: N/A;   ESOPHAGOGASTRODUODENOSCOPY (EGD) WITH PROPOFOL N/A 02/01/2018   Procedure: ESOPHAGOGASTRODUODENOSCOPY (EGD) WITH PROPOFOL;  Surgeon: [name], MD;  Location: [facility];  Service: Gastroenterology;  Laterality: N/A;   ESOPHAGOGASTRODUODENOSCOPY (EGD) WITH PROPOFOL N/A 10/11/2018   Procedure: ESOPHAGOGASTRODUODENOSCOPY (EGD) WITH PROPOFOL;  Surgeon: [name], MD;  Location: [facility];  Service: Gastroenterology;  Laterality: N/A;   INCONTINENCE SURGERY  2009   urinary  bladder sling   IR IMAGING GUIDED PORT INSERTION  04/08/2019   LEEP N/A 03/28/2014   Procedure: LOOP ELECTROSURGICAL EXCISION PROCEDURE (LEEP) cone biopsy;  Surgeon: Cyril Mourning, MD;  Location: [facility];  Service: Gynecology;  Laterality: N/A;   LEEP N/A 03/22/2020   Procedure: LOOP ELECTROSURGICAL EXCISION PROCEDURE (LEEP);  Surgeon: Dian Queen, MD;  Location:  [facility];  Service: Gynecology;  Laterality: N/A;   LEFT HEART CATH AND CORONARY ANGIOGRAPHY N/A 03/23/2017   Procedure: LEFT HEART CATH AND CORONARY ANGIOGRAPHY;  Surgeon: Jettie Booze, MD;  Location: [facility];  Service: Cardiovascular;  Laterality: N/A;   LIVER BIOPSY     x 2   LIVER TRANSPLANT  08/ 24/2021   RADIAL ARTERY HARVEST Left 03/24/2017   Procedure: RADIAL ARTERY HARVEST;  Surgeon: Ivin Poot, MD;  Location: [facility];  Service: Open Heart Surgery;  Laterality: Left;   TEE WITHOUT CARDIOVERSION N/A 03/24/2017   Procedure: TRANSESOPHAGEAL ECHOCARDIOGRAM (TEE);  Surgeon: Prescott Gum, Collier Salina, MD;  Location: [facility];  Service: Open Heart Surgery;  Laterality: N/A;   WISDOM TOOTH EXTRACTION  yrs ago    There were no vitals filed for this visit.   Subjective Assessment - 09/26/20 1548     Subjective "I am doing better, no pain today."    Currently in Pain? Yes    Pain Score 0-No pain    Pain Orientation Left    Pain Descriptors / Indicators Aching    Pain Type Chronic pain  [facility] PT Assessment - 09/26/20 0001       Assessment   Medical Diagnosis Sciatica    Referring Provider (PT) Leighton Ruff, MD                           [facility] Adult PT Treatment/Exercise - 09/26/20 0001       Lumbar Exercises: Stretches   Active Hamstring Stretch 3 reps;Left;30 seconds   PNF contract/ relax     Lumbar Exercises: Seated   Other Seated Lumbar Exercises hip ER 2 x 15 with ball between knees with GTB      Lumbar Exercises: Supine   Straight Leg Raise 15 reps   x 2 LLE only   Other Supine Lumbar Exercises clamshell 2 x 15   BTB     Manual Therapy   Manual Therapy Joint mobilization;Soft tissue mobilization    Manual therapy comments MTPR over bil glute med x 2    Joint Mobilization LLE LAD grade III, AP grade III bil hipo                      PT Short Term Goals - 08/30/20 1043       PT SHORT  TERM GOAL #1   Title pt to be IND with initial HEP    Time 4    Period Weeks    Status New    Target Date 09/27/20      PT SHORT TERM GOAL #2   Title Pt will be able to transition on and off mat (sit to supine to sit ) with min increase in pain .    Time 4    Period Weeks    Status New    Target Date 09/27/20      PT SHORT TERM GOAL #3   Title increase 5 x sit to stand  to 20 sec or better  to work toward functional / safe transitions    Time 4    Period Weeks    Status New    Target Date 09/27/20      PT SHORT TERM GOAL #4   Title FOTO will be discussed with patient to inform about potential to improve her condition.    Time 4    Period Weeks    Status New    Target Date 09/27/20               PT Long Term Goals - 08/30/20 1045       PT LONG TERM GOAL #1   Title Pt will be able to walk without noticeable limp while in the clinic.    Time 8    Period Weeks    Status New    Target Date 10/25/20      PT LONG TERM GOAL #2   Title Pt will be able to improve core strength to be able to perform lower abdominal exercises without increased pain and loss of neutral spine    Time 8    Period Weeks    Status New    Target Date 10/25/20      PT LONG TERM GOAL #3   Title Pt will be  with HEP for core , trunk, hips    Time 8    Period Weeks    Status New    Target Date 10/25/20      PT LONG TERM GOAL #4   Title Patient will score 66% or  better on FOTO to demo improved functional mobility for work, home tasks    Time 8    Period Weeks    Status New    Target Date 10/25/20      PT LONG TERM GOAL #5   Title Pt will be able to safely lift, show good body mechanics to aid in prevention of recurring back pain at work    Time 8    Period Weeks    Status New    Target Date 10/25/20                   Plan - 09/26/20 1629     Clinical Impression Statement Christine Butler reports she is doing better today compared to previous sessions, and has been consistent  with her HEP. Worked on hip mobility with posterior mobs and MTPR along bil glute med/ min. She did well with hip strengthenning. worked on potential SIJ involvement on the L which she did respond favorably. no report of pain following session.    PT Treatment/Interventions ADLs/Self Care Home Management;Electrical Stimulation;Therapeutic activities;Cryotherapy;Traction;Moist Heat;Therapeutic exercise;Balance training;Functional mobility training;Neuromuscular re-education;Patient/family education;Manual techniques;Dry needling;Taping;Spinal Manipulations    PT Next Visit Plan HEP, stabilization, traction, MTPR vs DN PRN    PT Home Exercise Plan [email or web address]    Consulted and Agree with Plan of Care Patient             Patient will benefit from skilled therapeutic intervention in order to improve the following deficits and impairments:  Decreased activity tolerance, Decreased strength, Increased fascial restricitons, Impaired flexibility, Postural dysfunction, Pain, Decreased range of motion, Difficulty walking, Decreased mobility, Decreased balance, Abnormal gait, Other (comment)  Visit Diagnosis: No diagnosis found.     Problem List Patient Active Problem List   Diagnosis Date Noted   Chronic low back pain    Hypertension    Urinary incontinence    Wears glasses    Current episode of major depressive disorder without prior episode 03/13/2020   S/P CABG (coronary artery bypass graft)    Port-A-Cath in place 11/21/2019   Encephalopathy 09/29/2019   Pancytopenia ([place]) 09/28/2019   Arthritis 09/21/2019   PONV (postoperative nausea and vomiting) 09/21/2019   SVD (spontaneous vaginal delivery) 09/21/2019   Urinary tract bacterial infections 09/21/2019   CAD (coronary artery disease)    Other insomnia 09/05/2019   Catatonia 08/18/2019   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   Altered mental status 08/14/2019   (HFpEF) heart failure with preserved ejection fraction ([place]) 08/09/2019   S/P LEEP  (loop electrosurgical excision procedure) 05/11/2019   High grade squamous intraepithelial lesion (HGSIL) on cytologic smear of cervix 05/04/2019   Familial hyperlipidemia 03/30/2019   H/O two vessel coronary artery bypass graft 03/30/2019   Status post liver transplantation ([place]) 03/30/2019   Hypo-osmolality and hyponatremia 03/30/2019   H/O LEEP 03/30/2019   Atypical squamous cells of undetermined significance (ASCUS) on Papanicolaou smear of cervix 08/05/2018   Upper GI bleed 02/01/2018   GI bleed 02/01/2018   Ascites--mild this admit 11/06/2017   Atypical chest pain 11/05/2017   Dyslipidemia, goal LDL below 70 10/13/2017   Acute blood loss anemia 04/20/2017   Elevated LFTs 04/20/2017   Coronary artery disease 03/24/2017   S/P CABG x 2 03/24/2017   CAD (coronary artery disease), native coronary artery 03/23/2017   Cirrhosis ([place]) 03/16/2017   GERD (gastroesophageal reflux disease) 01/28/2012   Primary biliary cirrhosis ([place]) 01/28/2012   IBS (irritable bowel syndrome) 01/28/2012   Primary biliary cholangitis ([place]) 01/28/2012  Christine Butler PT, DPT, LAT, ATC  09/26/20  5:28 PM      [facility] [address] [place], [place], [address] Phone: [phone]   Fax:  [phone]  Name: Christine Butler MRN: [id number] Date of Birth: January 09, 1971

## 2020-10-01 ENCOUNTER — Encounter: Payer: Self-pay | Admitting: Physical Therapy

## 2020-10-01 ENCOUNTER — Ambulatory Visit: Payer: 59 | Admitting: Physical Therapy

## 2020-10-01 ENCOUNTER — Other Ambulatory Visit: Payer: Self-pay

## 2020-10-01 DIAGNOSIS — Z79899 Other long term (current) drug therapy: Principal | ICD-10-CM

## 2020-10-01 DIAGNOSIS — Z944 Liver transplant status: Principal | ICD-10-CM

## 2020-10-01 DIAGNOSIS — M62830 Muscle spasm of back: Secondary | ICD-10-CM

## 2020-10-01 DIAGNOSIS — R2689 Other abnormalities of gait and mobility: Secondary | ICD-10-CM

## 2020-10-01 DIAGNOSIS — M6281 Muscle weakness (generalized): Secondary | ICD-10-CM | POA: Diagnosis not present

## 2020-10-01 DIAGNOSIS — R87612 Low grade squamous intraepithelial lesion on cytologic smear of cervix (LGSIL): Secondary | ICD-10-CM | POA: Diagnosis not present

## 2020-10-01 DIAGNOSIS — R87613 High grade squamous intraepithelial lesion on cytologic smear of cervix (HGSIL): Secondary | ICD-10-CM | POA: Diagnosis not present

## 2020-10-01 NOTE — Therapy (Addendum)
Christine Butler, [place], [address] Phone: [phone]   Fax:  [phone]  Physical Therapy Treatment / Discharge  Patient Details  Name: Christine Butler MRN: [id number] Date of Birth: 06-Jul-1970 Referring Provider (PT): Leighton Ruff, MD   Encounter Date: 10/01/2020   PT End of Session - 10/01/20 1552     Visit Number 4    Number of Visits 16    Date for PT Re-Evaluation 10/24/20    PT Start Time 8466    PT Stop Time 1630    PT Time Calculation (min) 42 min    Activity Tolerance Patient tolerated treatment well;Patient limited by pain    Behavior During Therapy [facility] for tasks assessed/performed             Past Medical History:  Diagnosis Date   (HFpEF) heart failure with preserved ejection fraction ([place]) 08/09/2019   Acute blood loss anemia 04/20/2017   Altered mental status 08/14/2019   Arthritis    right knee   Ascites--mild this admit 11/06/2017   Atypical chest pain 11/05/2017   Atypical squamous cells of undetermined significance (ASCUS) on Papanicolaou smear of cervix 08/05/2018   CAD (coronary artery disease)    a. s/p CABGx2 in 02/2017 (LAD not suitable for PCI), EF normal.   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   Chronic low back pain    Cirrhosis ([place]) 03/16/2017   Coronary artery disease 03/24/2017   Current episode of major depressive disorder without prior episode 03/13/2020   Dyslipidemia, goal LDL below 70 10/13/2017   Elevated LFTs 04/20/2017   Encephalopathy 09/29/2019   hepatic encephalopathy   Familial hyperlipidemia    GERD (gastroesophageal reflux disease)    GI bleed 02/01/2018   H/O LEEP 03/30/2019   H/O two vessel coronary artery bypass graft 03/30/2019   High grade squamous intraepithelial lesion (HGSIL) on cytologic smear of cervix 05/04/2019   Hypertension    Hypo-osmolality and hyponatremia  03/30/2019   IBS (irritable bowel syndrome)    Other insomnia 09/05/2019   Pancytopenia ([place]) 09/28/2019   PONV (postoperative nausea and vomiting)    i always throw up on waking up , but last EGD in march had no issues     PONV (postoperative nausea and vomiting)    likes zofran and steroid to help does not like scopolamine patch   Port-A-Cath in place 11/21/2019   Primary biliary cholangitis ([place]) 07/26/2012   Formatting of this note might be different from the original. IMOUPDATE   Primary biliary cirrhosis ([place])    cirhosis/liver disease followed by transplant team led by Dr Manuella Ghazi at [place]    S/P CABG (coronary artery bypass graft)    S/P CABG x 2 03/24/2017   LIMA to DIAGONAL Portion of SVG/LEFT RADIAL to LAD   S/P LEEP (loop electrosurgical excision procedure) 05/11/2019   Status post liver transplantation ([place]) 03/30/2019   SVD (spontaneous vaginal delivery)    x 3   Upper GI bleed 02/01/2018   Urinary incontinence    occ   Urinary tract bacterial infections last oct 2021   Wears glasses     Past Surgical History:  Procedure Laterality Date   CERVICAL CONIZATION W/BX N/A 03/22/2020   Procedure: CONIZATION CERVIX WITH BIOPSY;  Surgeon: Dian Queen, MD;  Location: [place];  Service: Gynecology;  Laterality: N/A;   CHOLECYSTECTOMY  09/2019   COLPOSCOPY N/A  03/22/2020   Procedure: COLPOSCOPY;  Surgeon: Dian Queen, MD;  Location: [facility];  Service: Gynecology;  Laterality: N/A;   CORONARY ARTERY BYPASS GRAFT N/A 03/24/2017   Procedure: CORONARY ARTERY BYPASS GRAFTING (CABG) x two, using left internal mammary artery, left radial artery, and right leg greater saphenous vein harvested endoscopically;  Surgeon: Ivin Poot, MD;  Location: [place];  Service: Open Heart Surgery;  Laterality: N/A;   ENDOVEIN HARVEST OF GREATER SAPHENOUS VEIN Right 03/24/2017   Procedure: ENDOVEIN HARVEST OF GREATER SAPHENOUS VEIN;  Surgeon: Ivin Poot, MD;   Location: [place];  Service: Open Heart Surgery;  Laterality: Right;   ESOPHAGEAL BANDING  02/01/2018   Procedure: ESOPHAGEAL BANDING;  Surgeon: [name], MD;  Location[facility];  Service: Gastroenterology;;   ESOPHAGEAL BANDING N/A 03/29/2018   Procedure: ESOPHAGEAL BANDING;  Surgeon: [name], MD;  Location: [facility];  Service: Gastroenterology;  Laterality: N/A;   ESOPHAGEAL BANDING N/A 05/21/2018   Procedure: ESOPHAGEAL BANDING;  Surgeon: [name], MD;  Location: [facility];  Service: Gastroenterology;  Laterality: N/A;   ESOPHAGEAL BANDING N/A 10/11/2018   Procedure: ESOPHAGEAL BANDING;  Surgeon: [name], MD;  Location: [facility];  Service: Gastroenterology;  Laterality: N/A;   ESOPHAGOGASTRODUODENOSCOPY N/A 03/29/2018   Procedure: ESOPHAGOGASTRODUODENOSCOPY (EGD);  Surgeon: [name], MD;  Location: [facility];  Service: Gastroenterology;  Laterality: N/A;   ESOPHAGOGASTRODUODENOSCOPY N/A 05/21/2018   Procedure: ESOPHAGOGASTRODUODENOSCOPY (EGD);  Surgeon: [name], MD;  Location: [facility];  Service: Gastroenterology;  Laterality: N/A;   ESOPHAGOGASTRODUODENOSCOPY (EGD) WITH PROPOFOL N/A 02/01/2018   Procedure: ESOPHAGOGASTRODUODENOSCOPY (EGD) WITH PROPOFOL;  Surgeon: [name], MD;  [facility]: [facility];  Service: Gastroenterology;  Laterality: N/A;   ESOPHAGOGASTRODUODENOSCOPY (EGD) WITH PROPOFOL N/A 10/11/2018   Procedure: ESOPHAGOGASTRODUODENOSCOPY (EGD) WITH PROPOFOL;  Surgeon: [name], MD;  Location: [facility];  Service: Gastroenterology;  Laterality: N/A;   INCONTINENCE SURGERY  2009   urinary  bladder sling   IR IMAGING GUIDED PORT INSERTION  04/08/2019   LEEP N/A 03/28/2014   Procedure: LOOP ELECTROSURGICAL EXCISION PROCEDURE (LEEP) cone biopsy;  Surgeon: Cyril Mourning, MD;  Location: [facility];  Service: Gynecology;  Laterality: N/A;   LEEP N/A 03/22/2020   Procedure: LOOP ELECTROSURGICAL EXCISION PROCEDURE (LEEP);  Surgeon: Dian Queen,  MD;  [facility];  Service: Gynecology;  Laterality: N/A;   LEFT HEART CATH AND CORONARY ANGIOGRAPHY N/A 03/23/2017   Procedure: LEFT HEART CATH AND CORONARY ANGIOGRAPHY;  Surgeon: Jettie Booze, MD;  Location: [place] [facility];  Service: Cardiovascular;  Laterality: N/A;   LIVER BIOPSY     x 2   LIVER TRANSPLANT  08/ 24/2021   RADIAL ARTERY HARVEST Left 03/24/2017   Procedure: RADIAL ARTERY HARVEST;  Surgeon: Ivin Poot, MD;  Location: [place];  Service: Open Heart Surgery;  Laterality: Left;   TEE WITHOUT CARDIOVERSION N/A 03/24/2017   Procedure: TRANSESOPHAGEAL ECHOCARDIOGRAM (TEE);  Surgeon: Prescott Gum, Collier Salina, MD;  Location: [place];  Service: Open Heart Surgery;  Laterality: N/A;   WISDOM TOOTH EXTRACTION  yrs ago    There were no vitals filed for this visit.   Subjective Assessment - 10/01/20 1552     Subjective " I was pretty sore after the last session. I continued feel some slight tightness to the knee."    Currently in Pain? Yes    Pain Score 1  [facility] PT Assessment - 10/01/20 0001       Assessment   Medical Diagnosis Sciatica    Referring Provider (PT) Leighton Ruff, MD                           [facility] Adult PT Treatment/Exercise - 10/01/20 0001       Lumbar Exercises: Aerobic   Nustep L6 x 51min LE only      Lumbar Exercises: Standing   Heel Raises 10 reps   with ball squeeze for posterior tib activation   Other Standing Lumbar Exercises hip ab 2 x 10 with red theraband,      Lumbar Exercises: Sidelying   Other Sidelying Lumbar Exercises reverse clamshell 1 x 15      Manual Therapy   Manual Therapy Joint mobilization;Soft tissue mobilization;Other (comment)    Manual therapy comments skilled palpation and monitoring of pt throughout TPDN    Joint Mobilization LLE LAD grade III, AP grade III bil hipo    Soft tissue mobilization IASTM along the L piriformis    Other Manual Therapy tack and  stretch of the L Piriformis              Trigger Point Dry Needling - 10/01/20 0001     Consent Given? Yes    Education Handout Provided Previously provided    Muscles Treated Back/Hip Piriformis    Piriformis Response Twitch response elicited;Palpable increased muscle length   L                 PT Education - 10/01/20 1658     Education Details reviewed TPDN and benefits of treatment    Person(s) Educated Patient    Methods Explanation;Verbal cues    Comprehension Verbalized understanding;Verbal cues required              PT Short Term Goals - 08/30/20 1043       PT SHORT TERM GOAL #1   Title pt to be IND with initial HEP    Time 4    Period Weeks    Status New    Target Date 09/27/20      PT SHORT TERM GOAL #2   Title Pt will be able to transition on and off mat (sit to supine to sit ) with min increase in pain .    Time 4    Period Weeks    Status New    Target Date 09/27/20      PT SHORT TERM GOAL #3   Title increase 5 x sit to stand  to 20 sec or better  to work toward functional / safe transitions    Time 4    Period Weeks    Status New    Target Date 09/27/20      PT SHORT TERM GOAL #4   Title FOTO will be discussed with patient to inform about potential to improve her condition.    Time 4    Period Weeks    Status New    Target Date 09/27/20               PT Long Term Goals - 08/30/20 1045       PT LONG TERM GOAL #1   Title Pt will be able to walk without noticeable limp while in the clinic.    Time 8    Period Weeks    Status New    Target Date  10/25/20      PT LONG TERM GOAL #2   Title Pt will be able to improve core strength to be able to perform lower abdominal exercises without increased pain and loss of neutral spine    Time 8    Period Weeks    Status New    Target Date 10/25/20      PT LONG TERM GOAL #3   Title Pt will be  with HEP for core , trunk, hips    Time 8    Period Weeks    Status New    Target  Date 10/25/20      PT LONG TERM GOAL #4   Title Patient will score 66% or better on FOTO to demo improved functional mobility for work, home tasks    Time 8    Period Weeks    Status New    Target Date 10/25/20      PT LONG TERM GOAL #5   Title Pt will be able to safely lift, show good body mechanics to aid in prevention of recurring back pain at work    Time 8    Period Weeks    Status New    Target Date 10/25/20                   Plan - 10/01/20 1653     Clinical Impression Statement Christine Butler reports she was sore after the last session but is doing better today noting only minimal soreness in the back of the leg. educated and consent was given for TPDN focusing on the L piriformis followed with IASTM techniques. continued working on hip strengthening. She reported feeling better following session.    PT Treatment/Interventions ADLs/Self Care Home Management;Electrical Stimulation;Therapeutic activities;Cryotherapy;Traction;Moist Heat;Therapeutic exercise;Balance training;Functional mobility training;Neuromuscular re-education;Patient/family education;Manual techniques;Dry needling;Taping;Spinal Manipulations    PT Next Visit Plan HEP, stabilization, traction, response to DN. hip strengthening.    Consulted and Agree with Plan of Care Patient             Patient will benefit from skilled therapeutic intervention in order to improve the following deficits and impairments:  Decreased activity tolerance, Decreased strength, Increased fascial restricitons, Impaired flexibility, Postural dysfunction, Pain, Decreased range of motion, Difficulty walking, Decreased mobility, Decreased balance, Abnormal gait, Other (comment)  Visit Diagnosis: Muscle spasm of back  Muscle weakness (generalized)  Other abnormalities of gait and mobility     Problem List Patient Active Problem List   Diagnosis Date Noted   Chronic low back pain    Hypertension    Urinary incontinence     Wears glasses    Current episode of major depressive disorder without prior episode 03/13/2020   S/P CABG (coronary artery bypass graft)    Port-A-Cath in place 11/21/2019   Encephalopathy 09/29/2019   Pancytopenia ([place]) 09/28/2019   Arthritis 09/21/2019   PONV (postoperative nausea and vomiting) 09/21/2019   SVD (spontaneous vaginal delivery) 09/21/2019   Urinary tract bacterial infections 09/21/2019   CAD (coronary artery disease)    Other insomnia 09/05/2019   Catatonia 08/18/2019   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   Altered mental status 08/14/2019   (HFpEF) heart failure with preserved ejection fraction ([place]) 08/09/2019   S/P LEEP (loop electrosurgical excision procedure) 05/11/2019   High grade squamous intraepithelial lesion (HGSIL) on cytologic smear of cervix 05/04/2019   Familial hyperlipidemia 03/30/2019   H/O two vessel coronary artery bypass graft 03/30/2019   Status post liver transplantation ([place]) 03/30/2019   Hypo-osmolality and hyponatremia  03/30/2019   H/O LEEP 03/30/2019   Atypical squamous cells of undetermined significance (ASCUS) on Papanicolaou smear of cervix 08/05/2018   Upper GI bleed 02/01/2018   GI bleed 02/01/2018   Ascites--mild this admit 11/06/2017   Atypical chest pain 11/05/2017   Dyslipidemia, goal LDL below 70 10/13/2017   Acute blood loss anemia 04/20/2017   Elevated LFTs 04/20/2017   Coronary artery disease 03/24/2017   S/P CABG x 2 03/24/2017   CAD (coronary artery disease), native coronary artery 03/23/2017   Cirrhosis ([place]) 03/16/2017   GERD (gastroesophageal reflux disease) 01/28/2012   Primary biliary cirrhosis ([place]) 01/28/2012   IBS (irritable bowel syndrome) 01/28/2012   Primary biliary cholangitis ([place]) 01/28/2012   Christine Butler PT, DPT, LAT, ATC  10/01/20  5:00 PM     [facility] [address] [place], [place], [address] Phone: [phone]   Fax:  [phone]  Name:  Christine Butler MRN: [id number] Date of Birth: 1971/01/22     PHYSICAL THERAPY DISCHARGE SUMMARY  Visits from Start of Care: 4  Current functional level related to goals / functional outcomes: See goals   Remaining deficits: Current status unknown   Education / Equipment: HEP, theraband, posture   Patient agrees to discharge. Patient goals were partially met. Patient is being discharged due to not returning since the last visit.  Judaea Burgoon PT, DPT, LAT, ATC  11/19/20  11:12 AM

## 2020-10-02 ENCOUNTER — Other Ambulatory Visit (HOSPITAL_COMMUNITY): Payer: Self-pay

## 2020-10-02 MED FILL — Prednisone Tab 5 MG: ORAL | 30 days supply | Qty: 30 | Fill #3 | Status: AC

## 2020-10-03 ENCOUNTER — Other Ambulatory Visit (HOSPITAL_COMMUNITY): Payer: Self-pay

## 2020-10-04 ENCOUNTER — Other Ambulatory Visit (HOSPITAL_COMMUNITY): Payer: Self-pay

## 2020-10-05 DIAGNOSIS — Z79899 Other long term (current) drug therapy: Principal | ICD-10-CM

## 2020-10-05 DIAGNOSIS — Z944 Liver transplant status: Principal | ICD-10-CM

## 2020-10-06 ENCOUNTER — Other Ambulatory Visit (HOSPITAL_COMMUNITY): Payer: Self-pay

## 2020-10-06 MED FILL — Ursodiol Cap 300 MG: ORAL | 90 days supply | Qty: 270 | Fill #1 | Status: AC

## 2020-10-08 ENCOUNTER — Other Ambulatory Visit (HOSPITAL_COMMUNITY): Payer: Self-pay

## 2020-10-08 DIAGNOSIS — R7989 Other specified abnormal findings of blood chemistry: Principal | ICD-10-CM

## 2020-10-08 DIAGNOSIS — L299 Pruritus, unspecified: Principal | ICD-10-CM

## 2020-10-08 DIAGNOSIS — Z944 Liver transplant status: Principal | ICD-10-CM

## 2020-10-08 DIAGNOSIS — Z79899 Other long term (current) drug therapy: Principal | ICD-10-CM

## 2020-10-09 ENCOUNTER — Other Ambulatory Visit (HOSPITAL_COMMUNITY): Payer: Self-pay

## 2020-10-09 MED FILL — Finasteride Tab 5 MG: ORAL | 90 days supply | Qty: 45 | Fill #1 | Status: AC

## 2020-10-10 ENCOUNTER — Other Ambulatory Visit (HOSPITAL_COMMUNITY): Payer: Self-pay

## 2020-10-10 ENCOUNTER — Ambulatory Visit: Payer: 59 | Admitting: Cardiology

## 2020-10-12 ENCOUNTER — Ambulatory Visit: Admit: 2020-10-12 | Payer: PRIVATE HEALTH INSURANCE | Attending: Nutritionist | Primary: Nutritionist

## 2020-10-12 ENCOUNTER — Ambulatory Visit: Admit: 2020-10-12 | Payer: PRIVATE HEALTH INSURANCE

## 2020-10-12 DIAGNOSIS — Z1159 Encounter for screening for other viral diseases: Principal | ICD-10-CM

## 2020-10-12 DIAGNOSIS — Z944 Liver transplant status: Principal | ICD-10-CM

## 2020-10-12 DIAGNOSIS — Z79899 Other long term (current) drug therapy: Principal | ICD-10-CM

## 2020-10-12 DIAGNOSIS — E559 Vitamin D deficiency, unspecified: Principal | ICD-10-CM

## 2020-10-12 DIAGNOSIS — Z130 Encounter for screening for diseases of the blood and blood-forming organs and certain disorders involving the immune mechanism: Principal | ICD-10-CM

## 2020-10-15 ENCOUNTER — Other Ambulatory Visit (HOSPITAL_COMMUNITY): Payer: Self-pay

## 2020-10-15 ENCOUNTER — Ambulatory Visit: Admit: 2020-10-15 | Discharge: 2020-10-15 | Payer: PRIVATE HEALTH INSURANCE

## 2020-10-15 ENCOUNTER — Institutional Professional Consult (permissible substitution): Admit: 2020-10-15 | Discharge: 2020-10-15 | Payer: PRIVATE HEALTH INSURANCE

## 2020-10-15 DIAGNOSIS — Z944 Liver transplant status: Principal | ICD-10-CM

## 2020-10-15 DIAGNOSIS — Z79899 Other long term (current) drug therapy: Principal | ICD-10-CM

## 2020-10-15 DIAGNOSIS — K219 Gastro-esophageal reflux disease without esophagitis: Principal | ICD-10-CM

## 2020-10-15 DIAGNOSIS — R208 Other disturbances of skin sensation: Principal | ICD-10-CM

## 2020-10-15 DIAGNOSIS — Z4682 Encounter for fitting and adjustment of non-vascular catheter: Secondary | ICD-10-CM | POA: Diagnosis not present

## 2020-10-15 DIAGNOSIS — Z23 Encounter for immunization: Secondary | ICD-10-CM | POA: Diagnosis not present

## 2020-10-15 DIAGNOSIS — M810 Age-related osteoporosis without current pathological fracture: Secondary | ICD-10-CM | POA: Diagnosis not present

## 2020-10-15 DIAGNOSIS — Z7952 Long term (current) use of systemic steroids: Secondary | ICD-10-CM | POA: Diagnosis not present

## 2020-10-15 DIAGNOSIS — E559 Vitamin D deficiency, unspecified: Secondary | ICD-10-CM | POA: Diagnosis not present

## 2020-10-15 DIAGNOSIS — Z7982 Long term (current) use of aspirin: Secondary | ICD-10-CM | POA: Diagnosis not present

## 2020-10-15 DIAGNOSIS — Z1159 Encounter for screening for other viral diseases: Secondary | ICD-10-CM | POA: Diagnosis not present

## 2020-10-15 MED ORDER — OMEPRAZOLE 40 MG CAPSULE,DELAYED RELEASE
ORAL_CAPSULE | Freq: Every day | ORAL | 5 refills | 60 days | Status: CP
Start: 2020-10-15 — End: ?

## 2020-10-15 MED ORDER — GABAPENTIN 300 MG CAPSULE
ORAL_CAPSULE | Freq: Two times a day (BID) | ORAL | 6 refills | 30 days | Status: CP
Start: 2020-10-15 — End: 2021-10-15

## 2020-10-15 MED ORDER — OMEPRAZOLE 40 MG PO CPDR
40.0000 mg | DELAYED_RELEASE_CAPSULE | Freq: Every day | ORAL | 5 refills | Status: DC
  Filled 2020-10-15: qty 60, 60d supply, fill #0
  Filled 2021-01-02: qty 60, 60d supply, fill #1
  Filled 2021-03-11: qty 60, 60d supply, fill #2
  Filled 2021-05-04: qty 60, 60d supply, fill #3
  Filled 2021-07-11: qty 60, 60d supply, fill #4
  Filled 2021-09-20: qty 60, 60d supply, fill #5

## 2020-10-15 MED ORDER — GABAPENTIN 300 MG PO CAPS
ORAL_CAPSULE | ORAL | 6 refills | Status: DC
  Filled 2020-10-15 – 2020-11-09 (×2): qty 60, 30d supply, fill #0
  Filled 2020-12-03: qty 60, 30d supply, fill #1
  Filled 2021-01-10: qty 60, 30d supply, fill #2
  Filled 2021-02-13: qty 60, 30d supply, fill #3
  Filled 2021-04-24: qty 60, 30d supply, fill #4
  Filled 2021-07-11: qty 60, 30d supply, fill #5

## 2020-10-16 ENCOUNTER — Encounter: Admit: 2020-10-16 | Discharge: 2020-10-16 | Payer: PRIVATE HEALTH INSURANCE

## 2020-10-16 ENCOUNTER — Ambulatory Visit: Admit: 2020-10-16 | Discharge: 2020-10-16 | Payer: PRIVATE HEALTH INSURANCE

## 2020-10-16 DIAGNOSIS — I2510 Atherosclerotic heart disease of native coronary artery without angina pectoris: Secondary | ICD-10-CM | POA: Diagnosis not present

## 2020-10-16 DIAGNOSIS — K838 Other specified diseases of biliary tract: Secondary | ICD-10-CM | POA: Diagnosis not present

## 2020-10-16 DIAGNOSIS — Z4659 Encounter for fitting and adjustment of other gastrointestinal appliance and device: Secondary | ICD-10-CM | POA: Diagnosis not present

## 2020-10-16 DIAGNOSIS — I5032 Chronic diastolic (congestive) heart failure: Secondary | ICD-10-CM | POA: Diagnosis not present

## 2020-10-16 DIAGNOSIS — R932 Abnormal findings on diagnostic imaging of liver and biliary tract: Secondary | ICD-10-CM | POA: Diagnosis not present

## 2020-10-16 DIAGNOSIS — T85590A Other mechanical complication of bile duct prosthesis, initial encounter: Secondary | ICD-10-CM | POA: Diagnosis not present

## 2020-10-16 DIAGNOSIS — Z944 Liver transplant status: Secondary | ICD-10-CM | POA: Diagnosis not present

## 2020-10-16 DIAGNOSIS — N189 Chronic kidney disease, unspecified: Secondary | ICD-10-CM | POA: Diagnosis not present

## 2020-10-16 DIAGNOSIS — R7989 Other specified abnormal findings of blood chemistry: Secondary | ICD-10-CM | POA: Diagnosis not present

## 2020-10-16 DIAGNOSIS — K8050 Calculus of bile duct without cholangitis or cholecystitis without obstruction: Secondary | ICD-10-CM | POA: Diagnosis not present

## 2020-10-19 ENCOUNTER — Ambulatory Visit: Payer: 59 | Admitting: Cardiology

## 2020-10-19 ENCOUNTER — Telehealth
Admit: 2020-10-19 | Discharge: 2020-10-20 | Payer: PRIVATE HEALTH INSURANCE | Attending: Nutritionist | Primary: Nutritionist

## 2020-10-19 DIAGNOSIS — Z944 Liver transplant status: Secondary | ICD-10-CM | POA: Diagnosis not present

## 2020-10-22 ENCOUNTER — Telehealth: Payer: Self-pay | Admitting: Nutrition

## 2020-10-22 ENCOUNTER — Telehealth: Admit: 2020-10-22 | Discharge: 2020-10-23 | Payer: PRIVATE HEALTH INSURANCE

## 2020-10-22 DIAGNOSIS — Z79899 Other long term (current) drug therapy: Principal | ICD-10-CM

## 2020-10-22 DIAGNOSIS — Z944 Liver transplant status: Principal | ICD-10-CM

## 2020-10-22 DIAGNOSIS — E785 Hyperlipidemia, unspecified: Secondary | ICD-10-CM | POA: Diagnosis not present

## 2020-10-22 DIAGNOSIS — M810 Age-related osteoporosis without current pathological fracture: Secondary | ICD-10-CM | POA: Diagnosis not present

## 2020-10-22 DIAGNOSIS — T8649 Other complications of liver transplant: Secondary | ICD-10-CM | POA: Diagnosis not present

## 2020-10-22 DIAGNOSIS — K831 Obstruction of bile duct: Secondary | ICD-10-CM | POA: Diagnosis not present

## 2020-10-22 NOTE — Telephone Encounter (Signed)
Pt. Reported that she has not called the center.  She promised to do this after talking with me.

## 2020-10-25 ENCOUNTER — Other Ambulatory Visit (HOSPITAL_COMMUNITY): Payer: Self-pay

## 2020-10-25 ENCOUNTER — Other Ambulatory Visit (HOSPITAL_COMMUNITY)
Admission: RE | Admit: 2020-10-25 | Discharge: 2020-10-25 | Disposition: A | Discharge status: Home / Self Care | Payer: 59 | Source: Ambulatory Visit | Attending: Transplant Hepatology | Admitting: Transplant Hepatology

## 2020-10-25 DIAGNOSIS — R1013 Epigastric pain: Principal | ICD-10-CM

## 2020-10-25 DIAGNOSIS — Z944 Liver transplant status: Principal | ICD-10-CM

## 2020-10-25 DIAGNOSIS — K8309 Other cholangitis: Principal | ICD-10-CM

## 2020-10-25 DIAGNOSIS — Z09 Encounter for follow-up examination after completed treatment for conditions other than malignant neoplasm: Secondary | ICD-10-CM | POA: Diagnosis not present

## 2020-10-25 LAB — COMPREHENSIVE METABOLIC PANEL
ALT: 19 U/L (ref 0–44)
AST: 18 U/L (ref 15–41)
Albumin: 3.3 g/dL — ABNORMAL LOW (ref 3.5–5.0)
Alkaline Phosphatase: 120 U/L (ref 38–126)
Anion gap: 7 (ref 5–15)
BUN: 23 mg/dL — ABNORMAL HIGH (ref 6–20)
CO2: 24 mmol/L (ref 22–32)
Calcium: 9.1 mg/dL (ref 8.9–10.3)
Chloride: 107 mmol/L (ref 98–111)
Creatinine, Ser: 1.08 mg/dL — ABNORMAL HIGH (ref 0.44–1.00)
GFR, Estimated: >60 mL/min (ref >60)
Glucose, Bld: 102 mg/dL — ABNORMAL HIGH (ref 70–99)
Potassium: 4.6 mmol/L (ref 3.5–5.1)
Sodium: 138 mmol/L (ref 135–145)
Total Bilirubin: 0.8 mg/dL (ref 0.3–1.2)
Total Protein: 6.5 g/dL (ref 6.5–8.1)

## 2020-10-25 LAB — CBC WITH DIFFERENTIAL/PLATELET
Abs Immature Granulocytes: 0.02 10*3/uL (ref 0.00–0.07)
Basophils Absolute: 0 10*3/uL (ref 0.0–0.1)
Basophils Relative: 1 %
Eosinophils Absolute: 0.3 10*3/uL (ref 0.0–0.5)
Eosinophils Relative: 5 %
HCT: 29.2 % — ABNORMAL LOW (ref 36.0–46.0)
Hemoglobin: 10.3 g/dL — ABNORMAL LOW (ref 12.0–15.0)
Immature Granulocytes: 0 %
Lymphocytes Relative: 26 %
Lymphs Abs: 1.4 10*3/uL (ref 0.7–4.0)
MCH: 32.8 pg (ref 26.0–34.0)
MCHC: 35.3 g/dL (ref 30.0–36.0)
MCV: 93 fL (ref 80.0–100.0)
Monocytes Absolute: 0.5 10*3/uL (ref 0.1–1.0)
Monocytes Relative: 9 %
Neutro Abs: 3.1 10*3/uL (ref 1.7–7.7)
Neutrophils Relative %: 59 %
Platelets: 162 10*3/uL (ref 150–400)
RBC: 3.14 MIL/uL — ABNORMAL LOW (ref 3.87–5.11)
RDW: 12.1 % (ref 11.5–15.5)
WBC: 5.3 10*3/uL (ref 4.0–10.5)
nRBC: 0 % (ref 0.0–0.2)

## 2020-10-25 LAB — PHOSPHORUS: Phosphorus: 4.7 mg/dL — ABNORMAL HIGH (ref 2.5–4.6)

## 2020-10-25 LAB — BILIRUBIN, DIRECT: Bilirubin, Direct: 0.1 mg/dL (ref 0.0–0.2)

## 2020-10-25 LAB — GAMMA GT: GGT: 25 U/L (ref 7–50)

## 2020-10-25 LAB — MAGNESIUM: Magnesium: 1.6 mg/dL — ABNORMAL LOW (ref 1.7–2.4)

## 2020-10-25 MED ORDER — LEVOFLOXACIN 500 MG TABLET
ORAL_TABLET | Freq: Every day | ORAL | 0 refills | 5 days | Status: CP
Start: 2020-10-25 — End: 2020-10-30

## 2020-10-25 MED ORDER — LEVOFLOXACIN 500 MG PO TABS
ORAL_TABLET | ORAL | 0 refills | Status: DC
  Filled 2020-10-25: qty 5, 5d supply, fill #0

## 2020-10-27 LAB — TACROLIMUS LEVEL: Tacrolimus (FK506) - LabCorp: 7.6 ng/mL (ref 2.0–20.0)

## 2020-10-29 DIAGNOSIS — Z79899 Other long term (current) drug therapy: Principal | ICD-10-CM

## 2020-10-29 DIAGNOSIS — Z944 Liver transplant status: Principal | ICD-10-CM

## 2020-11-01 ENCOUNTER — Telehealth: Payer: Self-pay | Admitting: Dietician

## 2020-11-01 NOTE — Telephone Encounter (Signed)
Returned patient call. She states that she had called [facility] to make an appointment for Reclast infusion but she needs the following prior to this appointment:  Updated order form  Updated order  Prior authorization  Recent calcium and creatinine (done 10/25/20)  Forwarded request to Dr. Ronnie Derby nurse. Left message on patient phone.  Antonieta Iba, RD, LDN, CDCES

## 2020-11-05 ENCOUNTER — Other Ambulatory Visit (HOSPITAL_COMMUNITY): Payer: Self-pay

## 2020-11-05 DIAGNOSIS — Z944 Liver transplant status: Principal | ICD-10-CM

## 2020-11-05 DIAGNOSIS — Z79899 Other long term (current) drug therapy: Principal | ICD-10-CM

## 2020-11-07 ENCOUNTER — Other Ambulatory Visit (HOSPITAL_COMMUNITY): Payer: Self-pay

## 2020-11-07 ENCOUNTER — Other Ambulatory Visit (HOSPITAL_COMMUNITY)
Admission: AD | Admit: 2020-11-07 | Discharge: 2020-11-07 | Disposition: A | Discharge status: Home / Self Care | Payer: 59 | Source: Ambulatory Visit | Attending: Transplant Hepatology | Admitting: Transplant Hepatology

## 2020-11-07 DIAGNOSIS — Z944 Liver transplant status: Secondary | ICD-10-CM | POA: Diagnosis not present

## 2020-11-07 DIAGNOSIS — Z79899 Other long term (current) drug therapy: Secondary | ICD-10-CM | POA: Diagnosis not present

## 2020-11-07 LAB — CBC WITH DIFFERENTIAL/PLATELET
Abs Immature Granulocytes: 0.01 10*3/uL (ref 0.00–0.07)
Basophils Absolute: 0 10*3/uL (ref 0.0–0.1)
Basophils Relative: 1 %
Eosinophils Absolute: 0.4 10*3/uL (ref 0.0–0.5)
Eosinophils Relative: 7 %
HCT: 28.8 % — ABNORMAL LOW (ref 36.0–46.0)
Hemoglobin: 10.2 g/dL — ABNORMAL LOW (ref 12.0–15.0)
Immature Granulocytes: 0 %
Lymphocytes Relative: 25 %
Lymphs Abs: 1.2 10*3/uL (ref 0.7–4.0)
MCH: 32.5 pg (ref 26.0–34.0)
MCHC: 35.4 g/dL (ref 30.0–36.0)
MCV: 91.7 fL (ref 80.0–100.0)
Monocytes Absolute: 0.5 10*3/uL (ref 0.1–1.0)
Monocytes Relative: 10 %
Neutro Abs: 2.8 10*3/uL (ref 1.7–7.7)
Neutrophils Relative %: 57 %
Platelets: 173 10*3/uL (ref 150–400)
RBC: 3.14 MIL/uL — ABNORMAL LOW (ref 3.87–5.11)
RDW: 12.1 % (ref 11.5–15.5)
WBC: 4.9 10*3/uL (ref 4.0–10.5)
nRBC: 0 % (ref 0.0–0.2)

## 2020-11-07 LAB — PHOSPHORUS: Phosphorus: 4.7 mg/dL — ABNORMAL HIGH (ref 2.5–4.6)

## 2020-11-07 LAB — COMPREHENSIVE METABOLIC PANEL
ALT: 14 U/L (ref 0–44)
AST: 20 U/L (ref 15–41)
Albumin: 3.6 g/dL (ref 3.5–5.0)
Alkaline Phosphatase: 101 U/L (ref 38–126)
Anion gap: 7 (ref 5–15)
BUN: 26 mg/dL — ABNORMAL HIGH (ref 6–20)
CO2: 23 mmol/L (ref 22–32)
Calcium: 9.4 mg/dL (ref 8.9–10.3)
Chloride: 106 mmol/L (ref 98–111)
Creatinine, Ser: 1.26 mg/dL — ABNORMAL HIGH (ref 0.44–1.00)
GFR, Estimated: 52 mL/min — ABNORMAL LOW (ref >60)
Glucose, Bld: 99 mg/dL (ref 70–99)
Potassium: 4.6 mmol/L (ref 3.5–5.1)
Sodium: 136 mmol/L (ref 135–145)
Total Bilirubin: 0.8 mg/dL (ref 0.3–1.2)
Total Protein: 6.9 g/dL (ref 6.5–8.1)

## 2020-11-07 LAB — MAGNESIUM: Magnesium: 1.5 mg/dL — ABNORMAL LOW (ref 1.7–2.4)

## 2020-11-07 LAB — BILIRUBIN, DIRECT: Bilirubin, Direct: 0.1 mg/dL (ref 0.0–0.2)

## 2020-11-09 ENCOUNTER — Other Ambulatory Visit (HOSPITAL_COMMUNITY): Payer: Self-pay

## 2020-11-09 ENCOUNTER — Telehealth: Payer: Self-pay | Admitting: Dietician

## 2020-11-09 LAB — TACROLIMUS LEVEL: Tacrolimus (FK506) - LabCorp: 6.1 ng/mL (ref 2.0–20.0)

## 2020-11-09 NOTE — Telephone Encounter (Signed)
Returned patient call.  She states that the Infusion center at [facility] needs the following prior to making her appointment.  Updated order form  Updated order  Prior authroization  Recent calcium and creatinine (done 10/25/2020)  Forwarded request to staff who completes prior authorization. Left message on patient phone.   Antonieta Iba, RD, LDN, CDCES

## 2020-11-12 ENCOUNTER — Other Ambulatory Visit (HOSPITAL_COMMUNITY): Payer: Self-pay

## 2020-11-12 DIAGNOSIS — Z944 Liver transplant status: Principal | ICD-10-CM

## 2020-11-12 DIAGNOSIS — Z79899 Other long term (current) drug therapy: Principal | ICD-10-CM

## 2020-11-13 NOTE — Telephone Encounter (Signed)
Per Charlynn Court no PA needed

## 2020-11-15 ENCOUNTER — Other Ambulatory Visit (HOSPITAL_COMMUNITY): Payer: Self-pay

## 2020-11-19 ENCOUNTER — Encounter: Payer: Self-pay | Admitting: Internal Medicine

## 2020-11-19 ENCOUNTER — Telehealth (INDEPENDENT_AMBULATORY_CARE_PROVIDER_SITE_OTHER): Payer: 59 | Admitting: Internal Medicine

## 2020-11-19 DIAGNOSIS — Z79899 Other long term (current) drug therapy: Principal | ICD-10-CM

## 2020-11-19 DIAGNOSIS — Z944 Liver transplant status: Principal | ICD-10-CM

## 2020-11-19 DIAGNOSIS — E785 Hyperlipidemia, unspecified: Secondary | ICD-10-CM | POA: Diagnosis not present

## 2020-11-19 DIAGNOSIS — K743 Primary biliary cirrhosis: Secondary | ICD-10-CM

## 2020-11-19 DIAGNOSIS — Z951 Presence of aortocoronary bypass graft: Secondary | ICD-10-CM | POA: Diagnosis not present

## 2020-11-19 DIAGNOSIS — E7849 Other hyperlipidemia: Secondary | ICD-10-CM

## 2020-11-19 NOTE — Progress Notes (Signed)
Virtual Visit via Video Note   This visit type was conducted due to national recommendations for restrictions regarding the COVID-19 Pandemic (e.g. social distancing) in an effort to limit this patient's exposure and mitigate transmission in our community.  Due to her co-morbid illnesses, this patient is at least at moderate risk for complications without adequate follow up.  This format is felt to be most appropriate for this patient at this time.  All issues noted in this document were discussed and addressed.  A limited physical exam was performed with this format.  Please refer to the patient's chart for her consent to telehealth for [facility].  Date:  11/19/2020   ID:  Christine Butler, DOB [birth year], MRN [id number] The patient was identified using 2 identifiers.  Evaluation Performed:  Follow-Up Visit  Patient Location:  [place] [id number]  Provider location:   [address], [place] [address], [place] [address]  PCP:  Maude Leriche, [place]  Cardiologist:  [name], MD Electrophysiologist:  None   Chief Complaint:  Recent liver transplant  History of Present Illness:    Christine Butler is a 50 y.o. female who presents via audio/video conferencing for a telehealth visit today.  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  She has significant dyslipidemia however in the setting of end-stage liver disease.  This was thought to be due to [place].  She ultimately recently underwent liver transplant and had a marked reduction in her lipids across the board.  She had subsequently not refilled her Praluent and has been off the medication.  Labs obtained 2 weeks ago showed total cholesterol 137, triglycerides 190, HDL 41 and LDL 64 off therapy.  She does have a history of CABG and diastolic heart failure.  Overall she says she is feeling much better.  She still adjusting to the medications for her  transplant.  11/19/2020  Christine Butler returns today for follow-up.  Her cholesterol is actually been extremely well controlled after liver transplant.  Despite her medications which oftentimes can increase cholesterol levels, her most recent lipid profile showed total cholesterol 149, HDL 48, triglycerides 206 and LDL 67.  The elevation in triglycerides likely again medication related secondary to her immunosuppressants.  There is question whether she should be on a statin.  I am aware of some evidence that statins may provide some benefit post transplant with regards to rejection, however her lipids in my opinion appear to be cured.  Likely she had a familial hyperlipidemia.  Although PBC can cause elevations in lipids typically is not associated with early onset heart disease.  The fact that she did have early onset coronary disease is more suggestive of a genetic dyslipidemia.  That would typically be related to abnormal handling or overproduction in the liver.  With the fact that she now has new liver tissue, I think it is very likely that she has been "cured" of her disease.  Of course her cholesterol could go up naturally with weight gain and dietary discretions.  Ultimately she may need to be on some statin.  I would not be opposed for her to be on a statin however I think it is reasonably safe to use and if her hepatologist feels that its helpful from an antirejection standpoint that would be reasonable.  Currently however she is at goal.  The patient does not have symptoms concerning for COVID-19 infection (fever, chills, cough, or new SHORTNESS OF BREATH).  Prior CV studies:   The following studies were reviewed today:  Chart reviewed  PMHx:  Past Medical History:  Diagnosis Date   (HFpEF) heart failure with preserved ejection fraction ([place]) 08/09/2019   Acute blood loss anemia 04/20/2017   Altered mental status 08/14/2019   Arthritis    right knee   Ascites--mild this admit 11/06/2017    Atypical chest pain 11/05/2017   Atypical squamous cells of undetermined significance (ASCUS) on Papanicolaou smear of cervix 08/05/2018   CAD (coronary artery disease)    a. s/p CABGx2 in 02/2017 (LAD not suitable for PCI), EF normal.   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   Chronic low back pain    Cirrhosis ([place]) 03/16/2017   Coronary artery disease 03/24/2017   Current episode of major depressive disorder without prior episode 03/13/2020   Dyslipidemia, goal LDL below 70 10/13/2017   Elevated LFTs 04/20/2017   Encephalopathy 09/29/2019   hepatic encephalopathy   Familial hyperlipidemia    GERD (gastroesophageal reflux disease)    GI bleed 02/01/2018   H/O LEEP 03/30/2019   H/O two vessel coronary artery bypass graft 03/30/2019   High grade squamous intraepithelial lesion (HGSIL) on cytologic smear of cervix 05/04/2019   Hypertension    Hypo-osmolality and hyponatremia 03/30/2019   IBS (irritable bowel syndrome)    Other insomnia 09/05/2019   Pancytopenia ([place]) 09/28/2019   PONV (postoperative nausea and vomiting)    i always throw up on waking up , but last EGD in march had no issues     PONV (postoperative nausea and vomiting)    likes zofran and steroid to help does not like scopolamine patch   Port-A-Cath in place 11/21/2019   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 IMOUPDATE   Primary biliary cirrhosis ([place])    cirhosis/liver disease followed by transplant team led by Dr Manuella Ghazi at [facility]    S/P CABG (coronary artery bypass graft)    S/P CABG x 2 03/24/2017   LIMA to DIAGONAL Portion of SVG/LEFT RADIAL to LAD   S/P LEEP (loop electrosurgical excision procedure) 05/11/2019   Status post liver transplantation ([place]) 03/30/2019   SVD (spontaneous vaginal delivery)    x 3   Upper GI bleed 02/01/2018   Urinary incontinence    occ   Urinary tract  bacterial infections last oct 2021   Wears glasses     Past Surgical History:  Procedure Laterality Date   CERVICAL CONIZATION W/BX N/A 03/22/2020   Procedure: CONIZATION CERVIX WITH BIOPSY;  Surgeon: Dian Queen, MD;  Location: [facility];  Service: Gynecology;  Laterality: N/A;   CHOLECYSTECTOMY  09/2019   COLPOSCOPY N/A 03/22/2020   Procedure: COLPOSCOPY;  Surgeon: Dian Queen, MD;  Location: [facility];  Service: Gynecology;  Laterality: N/A;   CORONARY ARTERY BYPASS GRAFT N/A 03/24/2017   Procedure: CORONARY ARTERY BYPASS GRAFTING (CABG) x two, using left internal mammary artery, left radial artery, and right leg greater saphenous vein harvested endoscopically;  Surgeon: Ivin Poot, MD;  Location: [facility];  Service: Open Heart Surgery;  Laterality: N/A;   ENDOVEIN HARVEST OF GREATER SAPHENOUS VEIN Right 03/24/2017   Procedure: ENDOVEIN HARVEST OF GREATER SAPHENOUS VEIN;  Surgeon: Ivin Poot, MD;  Location: [facility];  Service: Open Heart Surgery;  Laterality: Right;   ESOPHAGEAL BANDING  02/01/2018   Procedure: ESOPHAGEAL  BANDING;  Surgeon: [name], MD;  Location: [facility];  Service: Gastroenterology;;   ESOPHAGEAL BANDING N/A 03/29/2018   Procedure: ESOPHAGEAL BANDING;  Surgeon: [name], MD;  Location: [facility];  Service: Gastroenterology;  Laterality: N/A;   ESOPHAGEAL BANDING N/A 05/21/2018   Procedure: ESOPHAGEAL BANDING;  Surgeon: [name], MD;  Location: [facility];  Service: Gastroenterology;  Laterality: N/A;   ESOPHAGEAL BANDING N/A 10/11/2018   Procedure: ESOPHAGEAL BANDING;  Surgeon: [name], MD;  Location: [facility];  Service: Gastroenterology;  Laterality: N/A;   ESOPHAGOGASTRODUODENOSCOPY N/A 03/29/2018   Procedure: ESOPHAGOGASTRODUODENOSCOPY (EGD);  Surgeon: [name], MD;  Location: [facility];  Service: Gastroenterology;  Laterality: N/A;   ESOPHAGOGASTRODUODENOSCOPY N/A 05/21/2018   Procedure:  ESOPHAGOGASTRODUODENOSCOPY (EGD);  Surgeon: [name], MD;  Location: [facility];  Service: Gastroenterology;  Laterality: N/A;   ESOPHAGOGASTRODUODENOSCOPY (EGD) WITH PROPOFOL N/A 02/01/2018   Procedure: ESOPHAGOGASTRODUODENOSCOPY (EGD) WITH PROPOFOL;  Surgeon: [name], MD;  Location: [facility];  Service: Gastroenterology;  Laterality: N/A;   ESOPHAGOGASTRODUODENOSCOPY (EGD) WITH PROPOFOL N/A 10/11/2018   Procedure: ESOPHAGOGASTRODUODENOSCOPY (EGD) WITH PROPOFOL;  Surgeon: [name], MD;  Location: [facility];  Service: Gastroenterology;  Laterality: N/A;   INCONTINENCE SURGERY  2009   urinary  bladder sling   IR IMAGING GUIDED PORT INSERTION  04/08/2019   LEEP N/A 03/28/2014   Procedure: LOOP ELECTROSURGICAL EXCISION PROCEDURE (LEEP) cone biopsy;  Surgeon: Cyril Mourning, MD;  Location: [facility];  Service: Gynecology;  Laterality: N/A;   LEEP N/A 03/22/2020   Procedure: LOOP ELECTROSURGICAL EXCISION PROCEDURE (LEEP);  Surgeon: Dian Queen, MD;  Location: [facility];  Service: Gynecology;  Laterality: N/A;   LEFT HEART CATH AND CORONARY ANGIOGRAPHY N/A 03/23/2017   Procedure: LEFT HEART CATH AND CORONARY ANGIOGRAPHY;  Surgeon: Jettie Booze, MD;  Location: [facility];  Service: Cardiovascular;  Laterality: N/A;   LIVER BIOPSY     x 2   LIVER TRANSPLANT  08/ 24/2021   RADIAL ARTERY HARVEST Left 03/24/2017   Procedure: RADIAL ARTERY HARVEST;  Surgeon: Ivin Poot, MD;  Location: [facility];  Service: Open Heart Surgery;  Laterality: Left;   TEE WITHOUT CARDIOVERSION N/A 03/24/2017   Procedure: TRANSESOPHAGEAL ECHOCARDIOGRAM (TEE);  Surgeon: Prescott Gum, Collier Salina, MD;  Location: [facility];  Service: Open Heart Surgery;  Laterality: N/A;   WISDOM TOOTH EXTRACTION  yrs ago    FAMHx:  Family History  Problem Relation Age of Onset   CAD Father    Diabetes Mellitus II Father    Heart disease Father    Heart attack Brother    Heart disease Maternal Aunt     Heart attack Paternal Grandmother    Heart attack Paternal Grandfather     SOCHx:   reports that she has never smoked. She has never used smokeless tobacco. She reports that she does not currently use alcohol after a past usage of about 1.0 standard drink per week. She reports that she does not use drugs.  ALLERGIES:  Allergies  Allergen Reactions   Codeine Nausea And Vomiting   Erythromycin Nausea And Vomiting   Fentanyl Nausea And Vomiting    MEDS:  Current Meds  Medication Sig   acetaminophen (TYLENOL) 500 MG tablet Take 1,000 mg by mouth every 8 (eight) hours as needed for mild pain.   ASPIRIN LOW DOSE 81 MG EC tablet Take 81 mg by mouth daily.   Biotin 5 MG CAPS Take 5 mg by mouth daily in the afternoon.  Calcium Citrate (CITRACAL PO) Take 1,200 mg by mouth daily.   carvedilol (COREG) 25 MG tablet Take 12.5 mg by mouth 2 (two) times daily.   cycloSPORINE (RESTASIS) 0.05 % ophthalmic emulsion Place 1 drop into both eyes at bedtime.    Emollient (COLLAGEN EX) Apply 1 application topically at bedtime. 3300 mg per serving per day tablet   ENVARSUS XR 1 MG TB24 Take 1mg  (with 4mg  tablet for total dose 5mg  each morning)   ENVARSUS XR 4 MG TB24 Take one 4mg  tablet (with one of the 1mg  tablets for total dose 5mg  each morning)   finasteride (PROSCAR) 5 MG tablet TAKE 1/2 TABLET BY MOUTH DAILY   fluticasone (FLONASE) 50 MCG/ACT nasal spray Place 1 spray into both nostrils daily as needed for allergies or rhinitis.    gabapentin (NEURONTIN) 300 MG capsule Take 1 capsule (300 mg total) by mouth two (2) times a day.   Multiple Vitamins-Minerals (DEKAS PLUS) CAPS Take 1 tablet by mouth daily.   nitroGLYCERIN (NITROSTAT) 0.4 MG SL tablet Place 0.4 mg under the tongue every 5 (five) minutes as needed for chest pain.   omeprazole (PRILOSEC) 40 MG capsule Take 1 capsule (40 mg total) by mouth daily.   ondansetron (ZOFRAN-ODT) 8 MG disintegrating tablet Take 8 mg by mouth 3 (three) times daily  as needed for vomiting or nausea.   ursodiol (ACTIGALL) 300 MG capsule TAKE 1 CAPSULE BY MOUTH 3 TIMES DAILY     ROS: Pertinent items noted in HPI and remainder of comprehensive ROS otherwise negative.  Labs/Other Tests and Data Reviewed:    Recent Labs: 11/07/2020: ALT 14; BUN 26; Creatinine, Ser 1.26; Hemoglobin 10.2; Magnesium 1.5; Platelets 173; Potassium 4.6; Sodium 136   Recent Lipid Panel Lab Results  Component Value Date/Time   CHOL 149 06/25/2020 08:30 AM   TRIG 206 (H) 06/25/2020 08:30 AM   HDL 48 06/25/2020 08:30 AM   CHOLHDL 3.1 06/25/2020 08:30 AM   CHOLHDL NOT CALCULATED 12/18/2017 07:20 AM   LDLCALC 67 06/25/2020 08:30 AM   LDLDIRECT 229 (H) 12/30/2017 08:56 AM    Wt Readings from Last 3 Encounters:  07/09/20 140 lb (63.5 kg)  06/14/20 160 lb 3.2 oz (72.7 kg)  04/20/20 148 lb (67.1 kg)     Exam:    Vital Signs:  LMP 05/10/2018 (Approximate)    General appearance: alert and no distress Lungs: No audible wheezing Abdomen: soft, non-tender; bowel sounds normal; no masses,  no organomegaly Skin: Skin color, texture, turgor normal. No rashes or lesions or No jaundice Neurologic: Mental status: Alert, oriented, thought content appropriate Psych: Pleasant  ASSESSMENT & PLAN:    Probable HeFH ASCVD with recent two-vessel CABG (02/2017) Strong family history of premature coronary artery disease PBC, with cirrhosis and ongoing jaundice (followed by Dr. Dorcas Mcmurray at [facility])  Christine Butler has had marked improvement in her lipids after transplantation of her liver. 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 Of course with medication, diet and inactivity, the cholesterol may rise, however, she has had significant improvement in her numbers. I think her trig elevations are related to her immunosuppressants. LDL at goal <70. If this rises, could consider statin. If the hepatologists feel that statin may provide a  benefit to reduce hepatic artery thrombosis (some limited data on this), then could be considered.  Otherwise, I would continue to monitor her off medication.  COVID-19 Education: The signs and symptoms of COVID-19 were discussed with the patient  and how to seek care for testing (follow up with PCP or arrange E-visit).  The importance of social distancing was discussed today.  Patient Risk:   After full review of this patients clinical status, I feel that they are at least moderate risk at this time.  Time:   Today, I have spent 25 minutes with the patient with telehealth technology discussing dyslipidemia, post transplant, use of Praluent.     Medication Adjustments/Labs and Tests Ordered: Current medicines are reviewed at length with the patient today.  Concerns regarding medicines are outlined above.   Tests Ordered: No orders of the defined types were placed in this encounter.   Medication Changes: No orders of the defined types were placed in this encounter.   Disposition:  in 1 year(s)  Pixie Casino, MD, [facility], [place] Director of the Advanced Lipid Disorders &  Cardiovascular Risk Reduction Clinic Diplomate of the American Board of Clinical Lipidology Attending Cardiologist  Direct Dial: [phone]  Fax: [phone]  Website:  [email or web address]  Pixie Casino, MD  11/19/2020 8:12 AM

## 2020-11-19 NOTE — Patient Instructions (Signed)
Medication Instructions:  NO CHANGES *If you need a refill on your cardiac medications before your next appointment, please call your pharmacy*   Lab Work: FASTING lipid panel to be completed before your next visit with Dr. Debara Pickett (in 1 year)  If you have labs (blood work) drawn today and your tests are completely normal, you will receive your results only by: [place] (if you have MyChart) OR A paper copy in the mail If you have any lab test that is abnormal or we need to change your treatment, we will call you to review the results.   Testing/Procedures: NONE   Follow-Up: At [facility], you and your health needs are our priority.  As part of our continuing mission to provide you with exceptional heart care, we have created designated Provider Care Teams.  These Care Teams include your primary Cardiologist (physician) and Advanced Practice Providers (APPs -  Physician Assistants and Nurse Practitioners) who all work together to provide you with the care you need, when you need it.  We recommend signing up for the patient portal called "MyChart".  Sign up information is provided on this After Visit Summary.  MyChart is used to connect with patients for Virtual Visits (Telemedicine).  Patients are able to view lab/test results, encounter notes, upcoming appointments, etc.  Non-urgent messages can be sent to your provider as well.   To learn more about what you can do with MyChart, go to [email or web address].    Your next appointment:   12 month(s)  The format for your next appointment:   In Person or Virtual  Provider:   Raliegh Ip Mali Hilty, MD - lipid clinic   Other Instructions

## 2020-11-26 DIAGNOSIS — Z944 Liver transplant status: Principal | ICD-10-CM

## 2020-11-26 DIAGNOSIS — Z79899 Other long term (current) drug therapy: Principal | ICD-10-CM

## 2020-12-03 ENCOUNTER — Other Ambulatory Visit (HOSPITAL_COMMUNITY): Payer: Self-pay

## 2020-12-03 ENCOUNTER — Other Ambulatory Visit (HOSPITAL_COMMUNITY): Payer: Self-pay | Admitting: *Deleted

## 2020-12-03 DIAGNOSIS — Z944 Liver transplant status: Principal | ICD-10-CM

## 2020-12-03 DIAGNOSIS — Z79899 Other long term (current) drug therapy: Principal | ICD-10-CM

## 2020-12-04 ENCOUNTER — Other Ambulatory Visit: Payer: Self-pay

## 2020-12-04 ENCOUNTER — Ambulatory Visit (HOSPITAL_COMMUNITY)
Admission: RE | Admit: 2020-12-04 | Discharge: 2020-12-04 | Disposition: A | Discharge status: Home / Self Care | Payer: 59 | Source: Ambulatory Visit | Attending: Endocrinology | Admitting: Endocrinology

## 2020-12-04 DIAGNOSIS — M818 Other osteoporosis without current pathological fracture: Secondary | ICD-10-CM | POA: Diagnosis not present

## 2020-12-04 MED ORDER — HEPARIN SOD (PORK) LOCK FLUSH 100 UNIT/ML IV SOLN: 500.0000 [IU] | Freq: Once | INTRAVENOUS | Status: DC

## 2020-12-04 MED ORDER — HEPARIN SOD (PORK) LOCK FLUSH 100 UNIT/ML IV SOLN
INTRAVENOUS | Status: AC
  Administered 2020-12-04: 500 [IU] via INTRAVENOUS
  Filled 2020-12-04: qty 5

## 2020-12-04 MED ORDER — ZOLEDRONIC ACID 5 MG/100ML IV SOLN
INTRAVENOUS | Status: AC
  Filled 2020-12-04: qty 100

## 2020-12-04 MED ORDER — ZOLEDRONIC ACID 5 MG/100ML IV SOLN
5.0000 mg | Freq: Once | INTRAVENOUS | Status: AC
  Administered 2020-12-04: 5 mg via INTRAVENOUS

## 2020-12-05 DIAGNOSIS — Z944 Liver transplant status: Principal | ICD-10-CM

## 2020-12-06 ENCOUNTER — Other Ambulatory Visit (HOSPITAL_COMMUNITY): Payer: Self-pay

## 2020-12-06 ENCOUNTER — Other Ambulatory Visit: Payer: Self-pay

## 2020-12-10 DIAGNOSIS — Z944 Liver transplant status: Principal | ICD-10-CM

## 2020-12-10 DIAGNOSIS — Z79899 Other long term (current) drug therapy: Principal | ICD-10-CM

## 2020-12-12 ENCOUNTER — Other Ambulatory Visit (HOSPITAL_COMMUNITY): Payer: Self-pay

## 2020-12-17 DIAGNOSIS — Z944 Liver transplant status: Principal | ICD-10-CM

## 2020-12-17 DIAGNOSIS — Z79899 Other long term (current) drug therapy: Principal | ICD-10-CM

## 2020-12-21 DIAGNOSIS — M79673 Pain in unspecified foot: Principal | ICD-10-CM

## 2020-12-21 DIAGNOSIS — Z944 Liver transplant status: Principal | ICD-10-CM

## 2020-12-21 DIAGNOSIS — M898X9 Other specified disorders of bone, unspecified site: Principal | ICD-10-CM

## 2020-12-21 DIAGNOSIS — M2550 Pain in unspecified joint: Principal | ICD-10-CM

## 2020-12-24 DIAGNOSIS — Z79899 Other long term (current) drug therapy: Principal | ICD-10-CM

## 2020-12-24 DIAGNOSIS — Z944 Liver transplant status: Principal | ICD-10-CM

## 2020-12-25 ENCOUNTER — Other Ambulatory Visit (HOSPITAL_COMMUNITY)
Admission: AD | Admit: 2020-12-25 | Discharge: 2020-12-25 | Disposition: A | Discharge status: Home / Self Care | Payer: 59 | Source: Ambulatory Visit | Attending: Transplant Hepatology | Admitting: Transplant Hepatology

## 2020-12-25 DIAGNOSIS — Z79899 Other long term (current) drug therapy: Secondary | ICD-10-CM | POA: Diagnosis not present

## 2020-12-25 DIAGNOSIS — Z944 Liver transplant status: Secondary | ICD-10-CM | POA: Diagnosis not present

## 2020-12-25 LAB — COMPREHENSIVE METABOLIC PANEL
ALT: 14 U/L (ref 0–44)
AST: 18 U/L (ref 15–41)
Albumin: 3.6 g/dL (ref 3.5–5.0)
Alkaline Phosphatase: 100 U/L (ref 38–126)
Anion gap: 8 (ref 5–15)
BUN: 24 mg/dL — ABNORMAL HIGH (ref 6–20)
CO2: 20 mmol/L — ABNORMAL LOW (ref 22–32)
Calcium: 9 mg/dL (ref 8.9–10.3)
Chloride: 107 mmol/L (ref 98–111)
Creatinine, Ser: 1.24 mg/dL — ABNORMAL HIGH (ref 0.44–1.00)
GFR, Estimated: 53 mL/min — ABNORMAL LOW (ref >60)
Glucose, Bld: 128 mg/dL — ABNORMAL HIGH (ref 70–99)
Potassium: 5 mmol/L (ref 3.5–5.1)
Sodium: 135 mmol/L (ref 135–145)
Total Bilirubin: 0.6 mg/dL (ref 0.3–1.2)
Total Protein: 6.4 g/dL — ABNORMAL LOW (ref 6.5–8.1)

## 2020-12-25 LAB — CBC WITH DIFFERENTIAL/PLATELET
Abs Immature Granulocytes: 0.01 10*3/uL (ref 0.00–0.07)
Basophils Absolute: 0 10*3/uL (ref 0.0–0.1)
Basophils Relative: 1 %
Eosinophils Absolute: 0.3 10*3/uL (ref 0.0–0.5)
Eosinophils Relative: 7 %
HCT: 28.8 % — ABNORMAL LOW (ref 36.0–46.0)
Hemoglobin: 10.4 g/dL — ABNORMAL LOW (ref 12.0–15.0)
Immature Granulocytes: 0 %
Lymphocytes Relative: 31 %
Lymphs Abs: 1.2 10*3/uL (ref 0.7–4.0)
MCH: 33 pg (ref 26.0–34.0)
MCHC: 36.1 g/dL — ABNORMAL HIGH (ref 30.0–36.0)
MCV: 91.4 fL (ref 80.0–100.0)
Monocytes Absolute: 0.3 10*3/uL (ref 0.1–1.0)
Monocytes Relative: 8 %
Neutro Abs: 2.1 10*3/uL (ref 1.7–7.7)
Neutrophils Relative %: 53 %
Platelets: 129 10*3/uL — ABNORMAL LOW (ref 150–400)
RBC: 3.15 MIL/uL — ABNORMAL LOW (ref 3.87–5.11)
RDW: 13 % (ref 11.5–15.5)
WBC: 3.9 10*3/uL — ABNORMAL LOW (ref 4.0–10.5)
nRBC: 0 % (ref 0.0–0.2)

## 2020-12-25 LAB — PHOSPHORUS: Phosphorus: 4.8 mg/dL — ABNORMAL HIGH (ref 2.5–4.6)

## 2020-12-25 LAB — GAMMA GT: GGT: 12 U/L (ref 7–50)

## 2020-12-25 LAB — MAGNESIUM: Magnesium: 1.8 mg/dL (ref 1.7–2.4)

## 2020-12-25 LAB — BILIRUBIN, DIRECT: Bilirubin, Direct: <0.1 mg/dL (ref 0.0–0.2)

## 2020-12-26 LAB — TACROLIMUS LEVEL: Tacrolimus (FK506) - LabCorp: 8 ng/mL (ref 2.0–20.0)

## 2020-12-31 DIAGNOSIS — Z79899 Other long term (current) drug therapy: Principal | ICD-10-CM

## 2020-12-31 DIAGNOSIS — Z944 Liver transplant status: Principal | ICD-10-CM

## 2021-01-02 MED FILL — Finasteride Tab 5 MG: ORAL | 90 days supply | Qty: 45 | Fill #2 | Status: AC

## 2021-01-03 ENCOUNTER — Other Ambulatory Visit (HOSPITAL_COMMUNITY): Payer: Self-pay

## 2021-01-07 ENCOUNTER — Other Ambulatory Visit: Payer: Self-pay

## 2021-01-07 ENCOUNTER — Other Ambulatory Visit (HOSPITAL_COMMUNITY): Payer: Self-pay

## 2021-01-07 DIAGNOSIS — Z79899 Other long term (current) drug therapy: Principal | ICD-10-CM

## 2021-01-07 DIAGNOSIS — Z944 Liver transplant status: Principal | ICD-10-CM

## 2021-01-08 ENCOUNTER — Ambulatory Visit (INDEPENDENT_AMBULATORY_CARE_PROVIDER_SITE_OTHER): Payer: 59 | Admitting: Cardiology

## 2021-01-08 ENCOUNTER — Other Ambulatory Visit: Payer: Self-pay

## 2021-01-08 ENCOUNTER — Encounter: Payer: Self-pay | Admitting: Cardiology

## 2021-01-08 VITALS — BP 136/82 | HR 76 | Ht 61.6 in | Wt 172.1 lb

## 2021-01-08 DIAGNOSIS — I10 Essential (primary) hypertension: Secondary | ICD-10-CM | POA: Diagnosis not present

## 2021-01-08 DIAGNOSIS — I2510 Atherosclerotic heart disease of native coronary artery without angina pectoris: Secondary | ICD-10-CM

## 2021-01-08 DIAGNOSIS — Z951 Presence of aortocoronary bypass graft: Secondary | ICD-10-CM | POA: Diagnosis not present

## 2021-01-08 DIAGNOSIS — E785 Hyperlipidemia, unspecified: Secondary | ICD-10-CM | POA: Diagnosis not present

## 2021-01-08 DIAGNOSIS — Z944 Liver transplant status: Secondary | ICD-10-CM

## 2021-01-08 NOTE — Patient Instructions (Signed)

## 2021-01-08 NOTE — Progress Notes (Signed)
Cardiology Office Note:    Date:  01/08/2021   ID:  Christine Butler, DOB Nov 19, 1970, MRN [id number]  PCP:  Maude Leriche, PA-C  Cardiologist:  [name], MD   Referring MD: Maude Leriche, PA-C    ASSESSMENT:    1. Coronary artery disease involving native coronary artery of native heart without angina pectoris   2. Dyslipidemia, goal LDL below 70   3. Primary hypertension   4. S/P CABG x 2   5. Status post liver transplantation [facility])    PLAN:    In order of problems listed above:  Coronary artery disease post CABG surgery: Secondary prevention stressed with the patient.  Importance of compliance with diet medication stressed and she vocalized understanding.  She was advised to walk at least half an hour a day 5 days a week and she promises to do so. Essential hypertension: Blood pressure stable and diet was emphasized.  Lifestyle modification urged. Mixed dyslipidemia: Mostly hypertriglyceridemia: This is managed by our lipid clinic.  She is not on statin therapy because of liver transplant issues and Dr. Charlestine Massed manages her lipids. Post liver transplant: Followed by specialist at tertiary center. Obesity: Weight reduction stressed.  Diet emphasized.Patient will be seen in follow-up appointment in 6 months or earlier if the patient has any concerns    Medication Adjustments/Labs and Tests Ordered: Current medicines are reviewed at length with the patient today.  Concerns regarding medicines are outlined above.  No orders of the defined types were placed in this encounter.  No orders of the defined types were placed in this encounter.    No chief complaint on file.    History of Present Illness:    Christine Butler is a 50 y.o. female.  Patient has past medical history of coronary artery disease post CABG surgery, essential hypertension, dyslipidemia mostly hypertriglyceridemia and post liver transplant.  She denies any problems at this time and takes care of  activities of daily living.  No chest pain orthopnea or PND.  At the time of my evaluation, the patient is alert awake oriented and in no distress.  She mentions to me that she is taking carvedilol 12.5 mg twice daily and tolerating it well.  Past Medical History:  Diagnosis Date   (HFpEF) heart failure with preserved ejection fraction ([place]) 08/09/2019   Acute blood loss anemia 04/20/2017   Altered mental status 08/14/2019   Arthritis    right knee   Ascites--mild this admit 11/06/2017   Atypical chest pain 11/05/2017   Atypical squamous cells of undetermined significance (ASCUS) on Papanicolaou smear of cervix 08/05/2018   CAD (coronary artery disease)    a. s/p CABGx2 in 02/2017 (LAD not suitable for PCI), EF normal.   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   Chronic low back pain    Cirrhosis ([place]) 03/16/2017   Coronary artery disease 03/24/2017   COVID-19 09/18/2020   Current episode of major depressive disorder without prior episode 03/13/2020   Dyslipidemia, goal LDL below 70 10/13/2017   Elevated LFTs 04/20/2017   Encephalopathy 09/29/2019   hepatic encephalopathy   Familial hyperlipidemia    GERD (gastroesophageal reflux disease)    GI bleed 02/01/2018   H/O LEEP 03/30/2019   H/O two vessel coronary artery bypass graft 03/30/2019   High grade squamous intraepithelial lesion (HGSIL) on cytologic smear of cervix 05/04/2019   History of gastrostomy, has currently ([place]) 07/12/2020   Formatting  of this note might be different from the original. Patient underwent ERCP 07/11/20. Procedure c/b hepaticogastrostomy stent placement.   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 IMOUPDATE   Primary biliary cirrhosis ([place])    cirhosis/liver disease followed by transplant team led by Dr Manuella Ghazi at [place]    S/P CABG (coronary artery bypass graft)    S/P CABG x 2 03/24/2017   LIMA to DIAGONAL Portion of SVG/LEFT RADIAL to LAD   S/P LEEP (loop electrosurgical excision procedure) 05/11/2019   Status post liver transplantation ([place]) 03/30/2019   SVD (spontaneous vaginal delivery)    x 3   Upper GI bleed 02/01/2018   Urinary incontinence    occ   Urinary tract bacterial infections last oct 2021   Wears glasses     Past Surgical History:  Procedure Laterality Date   CERVICAL CONIZATION W/BX N/A 03/22/2020   Procedure: CONIZATION CERVIX WITH BIOPSY;  Surgeon: Dian Queen, MD;  Location: [place];  Service: Gynecology;  Laterality: N/A;   CHOLECYSTECTOMY  09/2019   COLPOSCOPY N/A 03/22/2020   Procedure: COLPOSCOPY;  Surgeon: Dian Queen, MD;  Location[facility];  Service: Gynecology;  Laterality: N/A;   CORONARY ARTERY BYPASS GRAFT N/A 03/24/2017   Procedure: CORONARY ARTERY BYPASS GRAFTING (CABG) x two, using left internal mammary artery, left radial artery, and right leg greater saphenous vein harvested endoscopically;  Surgeon: Ivin Poot, MD;  Location: [place];  Service: Open Heart Surgery;  Laterality: N/A;   ENDOVEIN HARVEST OF GREATER SAPHENOUS VEIN Right 03/24/2017   Procedure: ENDOVEIN HARVEST OF GREATER SAPHENOUS VEIN;  Surgeon: Ivin Poot, MD;  Location: [place];  Service: Open Heart Surgery;  Laterality: Right;   ESOPHAGEAL BANDING  02/01/2018   Procedure: ESOPHAGEAL BANDING;  Surgeon: [name], MD;  Location: [facility];  Service: Gastroenterology;;   ESOPHAGEAL BANDING N/A  03/29/2018   Procedure: ESOPHAGEAL BANDING;  Surgeon: [name], MD;  Location: [facility];  Service: Gastroenterology;  Laterality: N/A;   ESOPHAGEAL BANDING N/A 05/21/2018   Procedure: ESOPHAGEAL BANDING;  Surgeon: [name], MD;  Location: [facility];  Service: Gastroenterology;  Laterality: N/A;   ESOPHAGEAL BANDING N/A 10/11/2018   Procedure: ESOPHAGEAL BANDING;  Surgeon: [name], MD;  Location: [facility];  Service: Gastroenterology;  Laterality: N/A;   ESOPHAGOGASTRODUODENOSCOPY N/A 03/29/2018   Procedure: ESOPHAGOGASTRODUODENOSCOPY (EGD);  Surgeon: [name], MD;  Location: [facility];  Service: Gastroenterology;  Laterality: N/A;   ESOPHAGOGASTRODUODENOSCOPY N/A 05/21/2018   Procedure: ESOPHAGOGASTRODUODENOSCOPY (EGD);  Surgeon: [name], MD;  Location: [facility];  Service: Gastroenterology;  Laterality: N/A;   ESOPHAGOGASTRODUODENOSCOPY (EGD) WITH PROPOFOL N/A 02/01/2018   Procedure: ESOPHAGOGASTRODUODENOSCOPY (EGD) WITH PROPOFOL;  Surgeon: [name], MD;  Location: [facility];  Service: Gastroenterology;  Laterality: N/A;   ESOPHAGOGASTRODUODENOSCOPY (EGD) WITH PROPOFOL N/A 10/11/2018   Procedure: ESOPHAGOGASTRODUODENOSCOPY (EGD) WITH PROPOFOL;  Surgeon: [name], MD;  Location: [facility];  Service: Gastroenterology;  Laterality: N/A;  INCONTINENCE SURGERY  2009   urinary  bladder sling   IR IMAGING GUIDED PORT INSERTION  04/08/2019   LEEP N/A 03/28/2014   Procedure: LOOP ELECTROSURGICAL EXCISION PROCEDURE (LEEP) cone biopsy;  Surgeon: Cyril Mourning, MD;  Location: [facility];  Service: Gynecology;  Laterality: N/A;   LEEP N/A 03/22/2020   Procedure: LOOP ELECTROSURGICAL EXCISION PROCEDURE (LEEP);  Surgeon: Dian Queen, MD;  Location: [facility];  Service: Gynecology;  Laterality: N/A;   LEFT HEART CATH AND CORONARY ANGIOGRAPHY N/A 03/23/2017   Procedure: LEFT HEART CATH AND CORONARY ANGIOGRAPHY;  Surgeon: Jettie Booze, MD;  Location:  [place] [facility];  Service: Cardiovascular;  Laterality: N/A;   LIVER BIOPSY     x 2   LIVER TRANSPLANT  08/ 24/2021   RADIAL ARTERY HARVEST Left 03/24/2017   Procedure: RADIAL ARTERY HARVEST;  Surgeon: Ivin Poot, MD;  Location: [place];  Service: Open Heart Surgery;  Laterality: Left;   TEE WITHOUT CARDIOVERSION N/A 03/24/2017   Procedure: TRANSESOPHAGEAL ECHOCARDIOGRAM (TEE);  Surgeon: Prescott Gum, Collier Salina, MD;  Location: [place];  Service: Open Heart Surgery;  Laterality: N/A;   WISDOM TOOTH EXTRACTION  yrs ago    Current Medications: Current Meds  Medication Sig   acetaminophen (TYLENOL) 500 MG tablet Take 1,000 mg by mouth every 8 (eight) hours as needed for mild pain.   ASPIRIN LOW DOSE 81 MG EC tablet Take 81 mg by mouth daily.   Biotin 5 MG CAPS Take 5 mg by mouth daily in the afternoon.   Calcium Citrate (CITRACAL PO) Take 1,200 mg by mouth daily.   cycloSPORINE (RESTASIS) 0.05 % ophthalmic emulsion Place 1 drop into both eyes at bedtime.    ENVARSUS XR 1 MG TB24 Take 1mg  (with 4mg  tablet for total dose 5mg  each morning)   ENVARSUS XR 4 MG TB24 Take one 4mg  tablet (with one of the 1mg  tablets for total dose 5mg  each morning)   finasteride (PROSCAR) 5 MG tablet TAKE 1/2 TABLET BY MOUTH DAILY   fluticasone (FLONASE) 50 MCG/ACT nasal spray Place 1 spray into both nostrils daily as needed for allergies or rhinitis.    gabapentin (NEURONTIN) 300 MG capsule Take 1 capsule (300 mg total) by mouth two (2) times a day.   Multiple Vitamins-Minerals (DEKAS PLUS) CAPS Take 1 tablet by mouth daily.   nitroGLYCERIN (NITROSTAT) 0.4 MG SL tablet Place 0.4 mg under the tongue every 5 (five) minutes as needed for chest pain.   omeprazole (PRILOSEC) 40 MG capsule Take 1 capsule (40 mg total) by mouth daily.   ursodiol (ACTIGALL) 300 MG capsule TAKE 1 CAPSULE BY MOUTH 3 TIMES DAILY     Allergies:   Codeine, Erythromycin, and Fentanyl   Social History   Socioeconomic History   Marital status:  Divorced    Spouse name: Not on file   Number of children: 3   Years of education: 16   Highest education level: Associate degree: academic program  Occupational History   Not on file  Tobacco Use   Smoking status: Never   Smokeless tobacco: Never  Vaping Use   Vaping Use: Never used  Substance and Sexual Activity   Alcohol use: Not Currently    Alcohol/week: 1.0 standard drink    Types: 1 Glasses of wine per week   Drug use: No   Sexual activity: Yes    Partners: Male    Birth control/protection: None  Other Topics Concern   Not on file  Social History Narrative   Not on file   Social Determinants of Health   Financial Resource Strain: Not on file  Food Insecurity: Not on file  Transportation Needs: Not on file  Physical Activity: Not on file  Stress: Not on file  Social Connections: Not on file     Family History: The patient's family history includes CAD in her father; Diabetes Mellitus II in her father; Heart attack in her brother, paternal grandfather, and paternal grandmother; Heart disease in her father and maternal aunt.  ROS:   Please see the history of present illness.    All other systems reviewed and are negative.  EKGs/Labs/Other Studies Reviewed:    The following studies were reviewed today: I discussed my findings with the patient at length.   Recent Labs: 12/25/2020: ALT 14; BUN 24; Creatinine, Ser 1.24; Hemoglobin 10.4; Magnesium 1.8; Platelets 129; Potassium 5.0; Sodium 135  Recent Lipid Panel    Component Value Date/Time   CHOL 149 06/25/2020 0830   TRIG 206 (H) 06/25/2020 0830   HDL 48 06/25/2020 0830   CHOLHDL 3.1 06/25/2020 0830   CHOLHDL NOT CALCULATED 12/18/2017 0720   VLDL 38 12/18/2017 0720   LDLCALC 67 06/25/2020 0830   LDLDIRECT 229 (H) 12/30/2017 0856    Physical Exam:    VS:  BP 136/82   Pulse 76   Ht 5' 1.6" (1.565 m)   Wt 172 lb 1.3 oz (78.1 kg)   LMP 05/10/2018 (Approximate)   SpO2 97%   BMI 31.88 kg/m     Wt  Readings from Last 3 Encounters:  01/08/21 172 lb 1.3 oz (78.1 kg)  07/09/20 140 lb (63.5 kg)  06/14/20 160 lb 3.2 oz (72.7 kg)     GEN: Patient is in no acute distress HEENT: Normal NECK: No JVD; No carotid bruits LYMPHATICS: No lymphadenopathy CARDIAC: Hear sounds regular, 2/6 systolic murmur at the apex. RESPIRATORY:  Clear to auscultation without rales, wheezing or rhonchi  ABDOMEN: Soft, non-tender, non-distended MUSCULOSKELETAL:  No edema; No deformity  SKIN: Warm and dry NEUROLOGIC:  Alert and oriented x 3 PSYCHIATRIC:  Normal affect   Signed, [name], MD  01/08/2021 3:54 PM    [facility]

## 2021-01-09 DIAGNOSIS — Z0000 Encounter for general adult medical examination without abnormal findings: Secondary | ICD-10-CM | POA: Diagnosis not present

## 2021-01-09 DIAGNOSIS — Z944 Liver transplant status: Secondary | ICD-10-CM | POA: Diagnosis not present

## 2021-01-09 DIAGNOSIS — M810 Age-related osteoporosis without current pathological fracture: Secondary | ICD-10-CM | POA: Diagnosis not present

## 2021-01-09 DIAGNOSIS — E785 Hyperlipidemia, unspecified: Secondary | ICD-10-CM | POA: Diagnosis not present

## 2021-01-09 DIAGNOSIS — M2550 Pain in unspecified joint: Secondary | ICD-10-CM | POA: Diagnosis not present

## 2021-01-09 DIAGNOSIS — Z951 Presence of aortocoronary bypass graft: Secondary | ICD-10-CM | POA: Diagnosis not present

## 2021-01-09 DIAGNOSIS — Z23 Encounter for immunization: Secondary | ICD-10-CM | POA: Diagnosis not present

## 2021-01-10 ENCOUNTER — Other Ambulatory Visit (HOSPITAL_COMMUNITY): Payer: Self-pay

## 2021-01-10 MED FILL — Ursodiol Cap 300 MG: ORAL | 90 days supply | Qty: 270 | Fill #2 | Status: AC

## 2021-01-11 ENCOUNTER — Other Ambulatory Visit (HOSPITAL_COMMUNITY): Payer: Self-pay

## 2021-01-14 DIAGNOSIS — Z79899 Other long term (current) drug therapy: Principal | ICD-10-CM

## 2021-01-14 DIAGNOSIS — Z944 Liver transplant status: Principal | ICD-10-CM

## 2021-01-15 ENCOUNTER — Telehealth: Payer: Self-pay | Admitting: Cardiology

## 2021-01-15 ENCOUNTER — Other Ambulatory Visit (HOSPITAL_COMMUNITY): Payer: Self-pay

## 2021-01-15 MED ORDER — CARVEDILOL 25 MG PO TABS
12.5000 mg | ORAL_TABLET | Freq: Two times a day (BID) | ORAL | 3 refills | Status: DC
  Filled 2021-01-15: qty 90, 90d supply, fill #0
  Filled 2021-04-30: qty 90, 90d supply, fill #1
  Filled 2021-08-09: qty 90, 90d supply, fill #2
  Filled 2021-11-08: qty 90, 90d supply, fill #3

## 2021-01-15 NOTE — Telephone Encounter (Signed)
*  STAT* If patient is at the pharmacy, call can be transferred to refill team.   1. Which medications need to be refilled? (please list name of each medication and dose if known) carvedilol (COREG) 25 MG tablet  2. Which pharmacy/location (including street and city if local pharmacy) is medication to be sent to? [facility]  3. Do they need a 30 day or 90 day supply?  90 day supply   Patient is out of medication

## 2021-01-15 NOTE — Telephone Encounter (Signed)
RX sent

## 2021-01-21 DIAGNOSIS — Z944 Liver transplant status: Principal | ICD-10-CM

## 2021-01-21 DIAGNOSIS — Z79899 Other long term (current) drug therapy: Principal | ICD-10-CM

## 2021-01-28 DIAGNOSIS — Z79899 Other long term (current) drug therapy: Principal | ICD-10-CM

## 2021-01-28 DIAGNOSIS — Z944 Liver transplant status: Principal | ICD-10-CM

## 2021-02-04 DIAGNOSIS — Z944 Liver transplant status: Principal | ICD-10-CM

## 2021-02-04 DIAGNOSIS — Z79899 Other long term (current) drug therapy: Principal | ICD-10-CM

## 2021-02-05 ENCOUNTER — Other Ambulatory Visit (HOSPITAL_COMMUNITY): Payer: Self-pay

## 2021-02-06 DIAGNOSIS — Z760 Encounter for issue of repeat prescription: Secondary | ICD-10-CM | POA: Diagnosis not present

## 2021-02-11 DIAGNOSIS — Z79899 Other long term (current) drug therapy: Principal | ICD-10-CM

## 2021-02-11 DIAGNOSIS — Z944 Liver transplant status: Principal | ICD-10-CM

## 2021-02-12 ENCOUNTER — Other Ambulatory Visit (HOSPITAL_COMMUNITY): Payer: Self-pay

## 2021-02-14 ENCOUNTER — Other Ambulatory Visit (HOSPITAL_COMMUNITY): Payer: Self-pay

## 2021-02-18 DIAGNOSIS — Z79899 Other long term (current) drug therapy: Principal | ICD-10-CM

## 2021-02-18 DIAGNOSIS — Z944 Liver transplant status: Principal | ICD-10-CM

## 2021-02-25 DIAGNOSIS — Z79899 Other long term (current) drug therapy: Principal | ICD-10-CM

## 2021-02-25 DIAGNOSIS — Z944 Liver transplant status: Principal | ICD-10-CM

## 2021-03-04 DIAGNOSIS — Z79899 Other long term (current) drug therapy: Principal | ICD-10-CM

## 2021-03-04 DIAGNOSIS — Z944 Liver transplant status: Principal | ICD-10-CM

## 2021-03-05 ENCOUNTER — Other Ambulatory Visit (HOSPITAL_COMMUNITY): Payer: Self-pay

## 2021-03-05 ENCOUNTER — Other Ambulatory Visit (HOSPITAL_COMMUNITY)
Admission: RE | Admit: 2021-03-05 | Discharge: 2021-03-05 | Disposition: A | Discharge status: Home / Self Care | Payer: 59 | Source: Ambulatory Visit | Attending: Cardiology | Admitting: Cardiology

## 2021-03-05 DIAGNOSIS — Z79899 Other long term (current) drug therapy: Secondary | ICD-10-CM | POA: Diagnosis not present

## 2021-03-05 DIAGNOSIS — Z944 Liver transplant status: Secondary | ICD-10-CM | POA: Diagnosis not present

## 2021-03-05 LAB — COMPREHENSIVE METABOLIC PANEL
ALT: 14 U/L (ref 0–44)
AST: 18 U/L (ref 15–41)
Albumin: 3.7 g/dL (ref 3.5–5.0)
Alkaline Phosphatase: 81 U/L (ref 38–126)
Anion gap: 12 (ref 5–15)
BUN: 31 mg/dL — ABNORMAL HIGH (ref 6–20)
CO2: 20 mmol/L — ABNORMAL LOW (ref 22–32)
Calcium: 9.3 mg/dL (ref 8.9–10.3)
Chloride: 108 mmol/L (ref 98–111)
Creatinine, Ser: 1.35 mg/dL — ABNORMAL HIGH (ref 0.44–1.00)
GFR, Estimated: 48 mL/min — ABNORMAL LOW (ref >60)
Glucose, Bld: 119 mg/dL — ABNORMAL HIGH (ref 70–99)
Potassium: 5.1 mmol/L (ref 3.5–5.1)
Sodium: 140 mmol/L (ref 135–145)
Total Bilirubin: 0.7 mg/dL (ref 0.3–1.2)
Total Protein: 6.7 g/dL (ref 6.5–8.1)

## 2021-03-05 LAB — CBC WITH DIFFERENTIAL/PLATELET
Abs Immature Granulocytes: 0.05 10*3/uL (ref 0.00–0.07)
Basophils Absolute: 0 10*3/uL (ref 0.0–0.1)
Basophils Relative: 1 %
Eosinophils Absolute: 0.3 10*3/uL (ref 0.0–0.5)
Eosinophils Relative: 7 %
HCT: 28.9 % — ABNORMAL LOW (ref 36.0–46.0)
Hemoglobin: 10.4 g/dL — ABNORMAL LOW (ref 12.0–15.0)
Immature Granulocytes: 1 %
Lymphocytes Relative: 26 %
Lymphs Abs: 1.3 10*3/uL (ref 0.7–4.0)
MCH: 32.4 pg (ref 26.0–34.0)
MCHC: 36 g/dL (ref 30.0–36.0)
MCV: 90 fL (ref 80.0–100.0)
Monocytes Absolute: 0.4 10*3/uL (ref 0.1–1.0)
Monocytes Relative: 8 %
Neutro Abs: 3 10*3/uL (ref 1.7–7.7)
Neutrophils Relative %: 57 %
Platelets: 145 10*3/uL — ABNORMAL LOW (ref 150–400)
RBC: 3.21 MIL/uL — ABNORMAL LOW (ref 3.87–5.11)
RDW: 12.4 % (ref 11.5–15.5)
WBC: 5.1 10*3/uL (ref 4.0–10.5)
nRBC: 0 % (ref 0.0–0.2)

## 2021-03-05 LAB — BILIRUBIN, DIRECT: Bilirubin, Direct: 0.1 mg/dL (ref 0.0–0.2)

## 2021-03-05 LAB — PHOSPHORUS: Phosphorus: 4.7 mg/dL — ABNORMAL HIGH (ref 2.5–4.6)

## 2021-03-05 LAB — GAMMA GT: GGT: 11 U/L (ref 7–50)

## 2021-03-05 LAB — MAGNESIUM: Magnesium: 1.7 mg/dL (ref 1.7–2.4)

## 2021-03-07 ENCOUNTER — Other Ambulatory Visit (HOSPITAL_COMMUNITY): Payer: Self-pay

## 2021-03-07 LAB — TACROLIMUS LEVEL: Tacrolimus (FK506) - LabCorp: 8.7 ng/mL (ref 2.0–20.0)

## 2021-03-08 ENCOUNTER — Other Ambulatory Visit (HOSPITAL_COMMUNITY): Payer: Self-pay

## 2021-03-08 DIAGNOSIS — Z79899 Other long term (current) drug therapy: Principal | ICD-10-CM

## 2021-03-08 DIAGNOSIS — Z944 Liver transplant status: Principal | ICD-10-CM

## 2021-03-08 MED ORDER — ENVARSUS XR 4 MG TABLET,EXTENDED RELEASE
ORAL_TABLET | Freq: Every day | ORAL | 3 refills | 180 days | Status: CP
Start: 2021-03-08 — End: ?

## 2021-03-08 MED ORDER — ENVARSUS XR 4 MG PO TB24
ORAL_TABLET | ORAL | 3 refills | Status: DC
  Filled 2021-04-16: qty 30, 30d supply, fill #0
  Filled 2021-05-06: qty 30, 30d supply, fill #1
  Filled 2021-06-03: qty 30, 30d supply, fill #2
  Filled 2021-08-09: qty 30, 30d supply, fill #3
  Filled 2021-09-04: qty 30, 30d supply, fill #4
  Filled 2021-10-02: qty 30, 30d supply, fill #5
  Filled 2021-11-04: qty 30, 30d supply, fill #6
  Filled 2021-12-04: qty 30, 30d supply, fill #7
  Filled 2022-01-02: qty 30, 30d supply, fill #8
  Filled 2022-01-30: qty 30, 30d supply, fill #9
  Filled 2022-02-25: qty 30, 30d supply, fill #10

## 2021-03-11 DIAGNOSIS — Z944 Liver transplant status: Principal | ICD-10-CM

## 2021-03-11 DIAGNOSIS — Z79899 Other long term (current) drug therapy: Principal | ICD-10-CM

## 2021-03-12 ENCOUNTER — Other Ambulatory Visit (HOSPITAL_COMMUNITY): Payer: Self-pay

## 2021-03-18 DIAGNOSIS — Z79899 Other long term (current) drug therapy: Principal | ICD-10-CM

## 2021-03-18 DIAGNOSIS — Z944 Liver transplant status: Principal | ICD-10-CM

## 2021-03-25 DIAGNOSIS — Z944 Liver transplant status: Principal | ICD-10-CM

## 2021-03-25 DIAGNOSIS — Z79899 Other long term (current) drug therapy: Principal | ICD-10-CM

## 2021-03-28 DIAGNOSIS — Z23 Encounter for immunization: Secondary | ICD-10-CM | POA: Diagnosis not present

## 2021-03-28 DIAGNOSIS — L813 Cafe au lait spots: Secondary | ICD-10-CM | POA: Diagnosis not present

## 2021-03-28 DIAGNOSIS — L578 Other skin changes due to chronic exposure to nonionizing radiation: Secondary | ICD-10-CM | POA: Diagnosis not present

## 2021-03-28 DIAGNOSIS — L570 Actinic keratosis: Secondary | ICD-10-CM | POA: Diagnosis not present

## 2021-03-28 DIAGNOSIS — L814 Other melanin hyperpigmentation: Secondary | ICD-10-CM | POA: Diagnosis not present

## 2021-03-29 DIAGNOSIS — M79672 Pain in left foot: Principal | ICD-10-CM

## 2021-03-29 DIAGNOSIS — M79671 Pain in right foot: Principal | ICD-10-CM

## 2021-03-29 DIAGNOSIS — M898X9 Other specified disorders of bone, unspecified site: Principal | ICD-10-CM

## 2021-03-29 DIAGNOSIS — Z944 Liver transplant status: Principal | ICD-10-CM

## 2021-04-01 ENCOUNTER — Encounter (HOSPITAL_BASED_OUTPATIENT_CLINIC_OR_DEPARTMENT_OTHER): Payer: Self-pay | Admitting: Emergency Medicine

## 2021-04-01 ENCOUNTER — Other Ambulatory Visit: Payer: Self-pay

## 2021-04-01 ENCOUNTER — Emergency Department (HOSPITAL_BASED_OUTPATIENT_CLINIC_OR_DEPARTMENT_OTHER)
Admission: EM | Admit: 2021-04-01 | Discharge: 2021-04-02 | Disposition: A | Discharge status: Home / Self Care | Payer: 59 | Attending: Emergency Medicine | Admitting: Emergency Medicine

## 2021-04-01 DIAGNOSIS — Z79899 Other long term (current) drug therapy: Principal | ICD-10-CM

## 2021-04-01 DIAGNOSIS — Z944 Liver transplant status: Principal | ICD-10-CM

## 2021-04-01 DIAGNOSIS — I119 Hypertensive heart disease without heart failure: Secondary | ICD-10-CM | POA: Insufficient documentation

## 2021-04-01 DIAGNOSIS — Z8616 Personal history of COVID-19: Secondary | ICD-10-CM | POA: Diagnosis not present

## 2021-04-01 DIAGNOSIS — R52 Pain, unspecified: Secondary | ICD-10-CM

## 2021-04-01 DIAGNOSIS — Z951 Presence of aortocoronary bypass graft: Secondary | ICD-10-CM | POA: Insufficient documentation

## 2021-04-01 DIAGNOSIS — Z7982 Long term (current) use of aspirin: Secondary | ICD-10-CM | POA: Insufficient documentation

## 2021-04-01 DIAGNOSIS — K219 Gastro-esophageal reflux disease without esophagitis: Secondary | ICD-10-CM | POA: Diagnosis not present

## 2021-04-01 DIAGNOSIS — I2510 Atherosclerotic heart disease of native coronary artery without angina pectoris: Secondary | ICD-10-CM | POA: Insufficient documentation

## 2021-04-01 DIAGNOSIS — N83201 Unspecified ovarian cyst, right side: Secondary | ICD-10-CM | POA: Diagnosis not present

## 2021-04-01 DIAGNOSIS — R1031 Right lower quadrant pain: Secondary | ICD-10-CM | POA: Diagnosis not present

## 2021-04-01 DIAGNOSIS — R1030 Lower abdominal pain, unspecified: Secondary | ICD-10-CM | POA: Diagnosis present

## 2021-04-01 LAB — PREGNANCY, URINE: Preg Test, Ur: NEGATIVE

## 2021-04-01 LAB — URINALYSIS, ROUTINE W REFLEX MICROSCOPIC
Bilirubin Urine: NEGATIVE
Glucose, UA: NEGATIVE mg/dL
Hgb urine dipstick: NEGATIVE
Ketones, ur: NEGATIVE mg/dL
Nitrite: NEGATIVE
Protein, ur: NEGATIVE mg/dL
Specific Gravity, Urine: 1.017 (ref 1.005–1.030)
pH: 5 (ref 5.0–8.0)

## 2021-04-01 LAB — CBC
HCT: 28.8 % — ABNORMAL LOW (ref 36.0–46.0)
Hemoglobin: 10.3 g/dL — ABNORMAL LOW (ref 12.0–15.0)
MCH: 31.8 pg (ref 26.0–34.0)
MCHC: 35.8 g/dL (ref 30.0–36.0)
MCV: 88.9 fL (ref 80.0–100.0)
Platelets: 154 10*3/uL (ref 150–400)
RBC: 3.24 MIL/uL — ABNORMAL LOW (ref 3.87–5.11)
RDW: 12.5 % (ref 11.5–15.5)
WBC: 7.5 10*3/uL (ref 4.0–10.5)
nRBC: 0 % (ref 0.0–0.2)

## 2021-04-01 NOTE — ED Triage Notes (Signed)
Pt via pov from home with lower abdominal pain x 3 days. Pt reports that it feels like period cramping, but she hasn't had a menstrual period since 2020. Pt denies urinary symptoms, n/v/d until she vomited tonight, presumably from the pain. Pt had liver transplant in August 2021. Pt alert & oriented, rocking back and forth during triage.

## 2021-04-01 NOTE — ED Notes (Signed)
Attempted blood draw; vein blew in hand. Pt has port that can be accessed when she reaches a room.

## 2021-04-02 ENCOUNTER — Emergency Department (HOSPITAL_BASED_OUTPATIENT_CLINIC_OR_DEPARTMENT_OTHER): Payer: 59

## 2021-04-02 ENCOUNTER — Encounter (HOSPITAL_BASED_OUTPATIENT_CLINIC_OR_DEPARTMENT_OTHER): Payer: Self-pay | Admitting: Radiology

## 2021-04-02 ENCOUNTER — Other Ambulatory Visit (HOSPITAL_COMMUNITY): Payer: Self-pay

## 2021-04-02 DIAGNOSIS — K2289 Other specified disease of esophagus: Secondary | ICD-10-CM | POA: Diagnosis not present

## 2021-04-02 DIAGNOSIS — K3189 Other diseases of stomach and duodenum: Secondary | ICD-10-CM | POA: Diagnosis not present

## 2021-04-02 DIAGNOSIS — K219 Gastro-esophageal reflux disease without esophagitis: Secondary | ICD-10-CM | POA: Diagnosis not present

## 2021-04-02 DIAGNOSIS — Z8616 Personal history of COVID-19: Secondary | ICD-10-CM | POA: Diagnosis not present

## 2021-04-02 DIAGNOSIS — I119 Hypertensive heart disease without heart failure: Secondary | ICD-10-CM | POA: Diagnosis not present

## 2021-04-02 DIAGNOSIS — N83291 Other ovarian cyst, right side: Secondary | ICD-10-CM | POA: Diagnosis not present

## 2021-04-02 DIAGNOSIS — R102 Pelvic and perineal pain: Secondary | ICD-10-CM | POA: Diagnosis not present

## 2021-04-02 DIAGNOSIS — Z7982 Long term (current) use of aspirin: Secondary | ICD-10-CM | POA: Diagnosis not present

## 2021-04-02 DIAGNOSIS — I700 Atherosclerosis of aorta: Secondary | ICD-10-CM | POA: Diagnosis not present

## 2021-04-02 DIAGNOSIS — Z951 Presence of aortocoronary bypass graft: Secondary | ICD-10-CM | POA: Diagnosis not present

## 2021-04-02 DIAGNOSIS — I2510 Atherosclerotic heart disease of native coronary artery without angina pectoris: Secondary | ICD-10-CM | POA: Diagnosis not present

## 2021-04-02 DIAGNOSIS — Z79899 Other long term (current) drug therapy: Secondary | ICD-10-CM | POA: Diagnosis not present

## 2021-04-02 DIAGNOSIS — N83201 Unspecified ovarian cyst, right side: Secondary | ICD-10-CM | POA: Diagnosis not present

## 2021-04-02 LAB — COMPREHENSIVE METABOLIC PANEL
ALT: 11 U/L (ref 0–44)
AST: 14 U/L — ABNORMAL LOW (ref 15–41)
Albumin: 4.1 g/dL (ref 3.5–5.0)
Alkaline Phosphatase: 74 U/L (ref 38–126)
Anion gap: 8 (ref 5–15)
BUN: 32 mg/dL — ABNORMAL HIGH (ref 6–20)
CO2: 23 mmol/L (ref 22–32)
Calcium: 9 mg/dL (ref 8.9–10.3)
Chloride: 109 mmol/L (ref 98–111)
Creatinine, Ser: 1.15 mg/dL — ABNORMAL HIGH (ref 0.44–1.00)
GFR, Estimated: 58 mL/min — ABNORMAL LOW (ref >60)
Glucose, Bld: 115 mg/dL — ABNORMAL HIGH (ref 70–99)
Potassium: 4.1 mmol/L (ref 3.5–5.1)
Sodium: 140 mmol/L (ref 135–145)
Total Bilirubin: 0.5 mg/dL (ref 0.3–1.2)
Total Protein: 6.8 g/dL (ref 6.5–8.1)

## 2021-04-02 LAB — LIPASE, BLOOD: Lipase: 33 U/L (ref 11–51)

## 2021-04-02 MED ORDER — ONDANSETRON HCL 4 MG/2ML IJ SOLN
4.0000 mg | Freq: Once | INTRAMUSCULAR | Status: AC
  Administered 2021-04-02: 4 mg via INTRAVENOUS
  Filled 2021-04-02: qty 2

## 2021-04-02 MED ORDER — HYDROMORPHONE HCL 1 MG/ML IJ SOLN
1.0000 mg | Freq: Once | INTRAMUSCULAR | Status: AC
  Administered 2021-04-02: 1 mg via INTRAVENOUS
  Filled 2021-04-02: qty 1

## 2021-04-02 MED ORDER — IOHEXOL 300 MG/ML  SOLN
80.0000 mL | Freq: Once | INTRAMUSCULAR | Status: AC | PRN
  Administered 2021-04-02: 80 mL via INTRAVENOUS

## 2021-04-02 MED ORDER — SODIUM CHLORIDE 0.9 % IV SOLN: Freq: Once | INTRAVENOUS | Status: AC

## 2021-04-02 MED ORDER — ONDANSETRON HCL 8 MG PO TABS
8.0000 mg | ORAL_TABLET | Freq: Three times a day (TID) | ORAL | 0 refills | Status: DC | PRN
  Filled 2021-04-02: qty 10, 4d supply, fill #0

## 2021-04-02 MED ORDER — PROCHLORPERAZINE EDISYLATE 10 MG/2ML IJ SOLN
10.0000 mg | Freq: Once | INTRAMUSCULAR | Status: AC | PRN
  Administered 2021-04-02: 10 mg via INTRAVENOUS
  Filled 2021-04-02: qty 2

## 2021-04-02 MED ORDER — OXYCODONE HCL 5 MG PO TABS
5.0000 mg | ORAL_TABLET | ORAL | 0 refills | Status: DC | PRN
  Filled 2021-04-02: qty 20, 3d supply, fill #0

## 2021-04-02 MED ORDER — HEPARIN SOD (PORK) LOCK FLUSH 100 UNIT/ML IV SOLN
500.0000 [IU] | Freq: Once | INTRAVENOUS | Status: AC
  Administered 2021-04-02: 500 [IU]
  Filled 2021-04-02: qty 5

## 2021-04-02 NOTE — ED Notes (Signed)
Patient transported to [place] via wheelchair with tech

## 2021-04-02 NOTE — ED Notes (Signed)
Pt reported 1 episode of emesis during [place], prn 10mg  of compazine administered.

## 2021-04-02 NOTE — ED Provider Notes (Signed)
[facility] Provider Note: Christine Spurling, MD, FACEP  CSN: [id number] MRN: [id number] ARRIVAL: 04/01/21 at 2234 ROOM: DB013/DB013   CHIEF COMPLAINT  Abdominal Pain   HISTORY OF PRESENT ILLNESS  04/02/21 12:18 AM Symphanie L Butler is a 51 y.o. female with 3 days of lower abdominal pain that worsened yesterday evening.  She describes the pain as cramping like, but she is postmenopausal.  She denies dysuria or hematuria.  She did vomit 1 time yesterday evening which she attributes to the pain.  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  She rates her pain as an 8 out of 10.  It is not significantly worse with movement or palpation.  She has been taking Tylenol for it as she is not supposed to take NSAIDs due to her antirejection medications.  She has also been using a heating pad.   Past Medical History:  Diagnosis Date   (HFpEF) heart failure with preserved ejection fraction ([place]) 08/09/2019   Acute blood loss anemia 04/20/2017   Altered mental status 08/14/2019   Arthritis    right knee   Ascites--mild this admit 11/06/2017   Atypical chest pain 11/05/2017   Atypical squamous cells of undetermined significance (ASCUS) on Papanicolaou smear of cervix 08/05/2018   CAD (coronary artery disease)    a. s/p CABGx2 in 02/2017 (LAD not suitable for PCI), EF normal.   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   Chronic low back pain    Cirrhosis ([place]) 03/16/2017   Coronary artery disease 03/24/2017   COVID-19 09/18/2020   Current episode of major depressive disorder without prior episode 03/13/2020   Dyslipidemia, goal LDL below 70 10/13/2017   Elevated LFTs 04/20/2017   Encephalopathy 09/29/2019   hepatic encephalopathy   Familial hyperlipidemia    GERD (gastroesophageal reflux disease)    GI bleed 02/01/2018   H/O LEEP 03/30/2019   H/O two vessel coronary artery bypass graft  03/30/2019   High grade squamous intraepithelial lesion (HGSIL) on cytologic smear of cervix 05/04/2019   History of gastrostomy, has currently [facility]) 07/12/2020   Formatting of this note might be different from the original. Patient underwent ERCP 07/11/20. Procedure c/b hepaticogastrostomy stent placement.   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 IMOUPDATE   Primary biliary cirrhosis ([place])    cirhosis/liver disease followed by transplant team led by Dr Manuella Ghazi at [place]    S/P CABG (coronary artery bypass graft)    S/P CABG x 2 03/24/2017   LIMA to DIAGONAL Portion of SVG/LEFT RADIAL to LAD   S/P LEEP (loop electrosurgical excision procedure) 05/11/2019   Status post liver transplantation ([place]) 03/30/2019   SVD (spontaneous vaginal delivery)    x 3   Upper GI bleed 02/01/2018   Urinary incontinence    occ   Urinary tract bacterial infections last oct 2021   Wears glasses  Past Surgical History:  Procedure Laterality Date   CERVICAL CONIZATION W/BX N/A 03/22/2020   Procedure: CONIZATION CERVIX WITH BIOPSY;  Surgeon: Christine Queen, MD;  Location: [place];  Service: Gynecology;  Laterality: N/A;   CHOLECYSTECTOMY  09/2019   COLPOSCOPY N/A 03/22/2020   Procedure: COLPOSCOPY;  Surgeon: Christine Queen, MD;  Location: [facility];  Service: Gynecology;  Laterality: N/A;   CORONARY ARTERY BYPASS GRAFT N/A 03/24/2017   Procedure: CORONARY ARTERY BYPASS GRAFTING (CABG) x two, using  left internal mammary artery, left radial artery, and right leg greater saphenous vein harvested endoscopically;  Surgeon: Ivin Poot, MD;  Location: [place];  Service: Open Heart Surgery;  Laterality: N/A;   ENDOVEIN HARVEST OF GREATER SAPHENOUS VEIN Right 03/24/2017   Procedure: ENDOVEIN HARVEST OF GREATER SAPHENOUS VEIN;  Surgeon: Ivin Poot, MD;  Location: [place];  Service: Open Heart Surgery;  Laterality: Right;   ESOPHAGEAL BANDING  02/01/2018   Procedure: ESOPHAGEAL BANDING;  Surgeon: [name], MD;  Location: [facility];  Service: Gastroenterology;;   ESOPHAGEAL BANDING N/A 03/29/2018   Procedure: ESOPHAGEAL BANDING;  Surgeon: [name], MD;  Location: [facility];  Service: Gastroenterology;  Laterality: N/A;   ESOPHAGEAL BANDING N/A 05/21/2018   Procedure: ESOPHAGEAL BANDING;  Surgeon: [name], MD;  Location: [facility];  Service: Gastroenterology;  Laterality: N/A;   ESOPHAGEAL BANDING N/A 10/11/2018   Procedure: ESOPHAGEAL BANDING;  Surgeon: [name], MD;  Location: [facility];  Service: Gastroenterology;  Laterality: N/A;   ESOPHAGOGASTRODUODENOSCOPY N/A 03/29/2018   Procedure: ESOPHAGOGASTRODUODENOSCOPY (EGD);  Surgeon: [name], MD;  Location: [facility];  Service: Gastroenterology;  Laterality: N/A;   ESOPHAGOGASTRODUODENOSCOPY N/A 05/21/2018   Procedure: ESOPHAGOGASTRODUODENOSCOPY (EGD);  Surgeon: [name], MD;  Location: [facility];  Service: Gastroenterology;  Laterality: N/A;   ESOPHAGOGASTRODUODENOSCOPY (EGD) WITH PROPOFOL N/A 02/01/2018   Procedure: ESOPHAGOGASTRODUODENOSCOPY (EGD) WITH PROPOFOL;  Surgeon: [name], MD;  Location: [facility];  Service: Gastroenterology;  Laterality: N/A;   ESOPHAGOGASTRODUODENOSCOPY (EGD) WITH PROPOFOL N/A 10/11/2018   Procedure: ESOPHAGOGASTRODUODENOSCOPY (EGD) WITH PROPOFOL;  Surgeon: [name], MD;  Location: [facility];  Service: Gastroenterology;  Laterality: N/A;   INCONTINENCE SURGERY  2009   urinary   bladder sling   IR IMAGING GUIDED PORT INSERTION  04/08/2019   LEEP N/A 03/28/2014   Procedure: LOOP ELECTROSURGICAL EXCISION PROCEDURE (LEEP) cone biopsy;  Surgeon: Cyril Mourning, MD;  Location: [facility];  Service: Gynecology;  Laterality: N/A;   LEEP N/A 03/22/2020   Procedure: LOOP ELECTROSURGICAL EXCISION PROCEDURE (LEEP);  Surgeon: Christine Queen, MD;  Location[facility];  Service: Gynecology;  Laterality: N/A;   LEFT HEART CATH AND CORONARY ANGIOGRAPHY N/A 03/23/2017   Procedure: LEFT HEART CATH AND CORONARY ANGIOGRAPHY;  Surgeon: Jettie Booze, MD;  Location: [place] [facility];  Service: Cardiovascular;  Laterality: N/A;   LIVER BIOPSY     x 2   LIVER TRANSPLANT  08/ 24/2021   RADIAL ARTERY HARVEST Left 03/24/2017   Procedure: RADIAL ARTERY HARVEST;  Surgeon: Ivin Poot, MD;  Location: [place];  Service: Open Heart Surgery;  Laterality: Left;   TEE WITHOUT CARDIOVERSION N/A 03/24/2017   Procedure: TRANSESOPHAGEAL ECHOCARDIOGRAM (TEE);  Surgeon: Prescott Gum, Collier Salina, MD;  Location: [place];  Service: Open Heart Surgery;  Laterality: N/A;   WISDOM TOOTH EXTRACTION  yrs ago    Family History  Problem Relation Age of Onset   CAD Father    Diabetes Mellitus II Father  Heart disease Father    Heart attack Brother    Heart disease Maternal Aunt    Heart attack Paternal Grandmother    Heart attack Paternal Grandfather     Social History   Tobacco Use   Smoking status: Never   Smokeless tobacco: Never  Vaping Use   Vaping Use: Never used  Substance Use Topics   Alcohol use: Not Currently    Alcohol/week: 1.0 standard drink    Types: 1 Glasses of wine per week   Drug use: No    Prior to Admission medications   Medication Sig Start Date End Date Taking? Authorizing Provider  ondansetron (ZOFRAN) 8 MG tablet Take 1 tablet (8 mg total) by mouth every 8 (eight) hours as needed for nausea or vomiting. 04/02/21  Yes Thursa Emme, MD  oxycodone (OXY-IR) 5 MG  capsule Take 1 capsule (5 mg total) by mouth every 4 (four) hours as needed for pain. 04/02/21  Yes Yasemin Rabon, MD  acetaminophen (TYLENOL) 500 MG tablet Take 1,000 mg by mouth every 8 (eight) hours as needed for mild pain.    [provider]  ASPIRIN LOW DOSE 81 MG EC tablet Take 81 mg by mouth daily. 11/23/19   [provider]  Biotin 5 MG CAPS Take 5 mg by mouth daily in the afternoon.    [provider]  Calcium Citrate (CITRACAL PO) Take 1,200 mg by mouth daily.    [provider]  carvedilol (COREG) 25 MG tablet Take 1/2 tablet (12.5 mg) by mouth 2 times daily with a meal. 01/15/21   Revankar, Reita Cliche, MD  cycloSPORINE (RESTASIS) 0.05 % ophthalmic emulsion Place 1 drop into both eyes at bedtime.  11/24/18   [provider]  ENVARSUS XR 1 MG TB24 Take 1mg  (with 4mg  tablet for total dose 5mg  each morning) 08/10/20     ENVARSUS XR 4 MG TB24 Take one 4mg  tablet (with one of the 1mg  tablets for total dose 5mg  each morning) 08/10/20     ENVARSUS XR 4 MG TB24 Take 1 tablet (4 mg total) by mouth in the morning. 03/08/21     finasteride (PROSCAR) 5 MG tablet TAKE 1/2 TABLET BY MOUTH DAILY 04/16/20 04/16/21    fluticasone (FLONASE) 50 MCG/ACT nasal spray Place 1 spray into both nostrils daily as needed for allergies or rhinitis.     [provider]  gabapentin (NEURONTIN) 300 MG capsule Take 1 capsule (300 mg total) by mouth two (2) times a day. 10/15/20     Multiple Vitamins-Minerals (DEKAS PLUS) CAPS Take 1 tablet by mouth daily.    [provider]  nitroGLYCERIN (NITROSTAT) 0.4 MG SL tablet Place 0.4 mg under the tongue every 5 (five) minutes as needed for chest pain.    [provider]  omeprazole (PRILOSEC) 40 MG capsule Take 1 capsule (40 mg total) by mouth daily. 10/15/20     ursodiol (ACTIGALL) 300 MG capsule TAKE 1 CAPSULE BY MOUTH 3 TIMES DAILY 04/16/20 04/16/21      Allergies Codeine, Erythromycin, and Fentanyl   REVIEW OF  SYSTEMS  Negative except as noted here or in the History of Present Illness.   PHYSICAL EXAMINATION  Initial Vital Signs Blood pressure (!) 153/85, pulse 69, temperature 98.5 F (36.9 C), temperature source Oral, resp. rate 16, height 5' 1.5" (1.562 m), weight 76.2 kg, last menstrual period 05/10/2018, SpO2 100 %.  Examination General: Well-developed, well-nourished female in no acute distress; appearance consistent with age of record HENT:  normocephalic; atraumatic Eyes: pupils equal, round and reactive to light; extraocular muscles intact; no scleral icterus Neck: supple Heart: regular rate and rhythm Lungs: clear to auscultation bilaterally Chest: Port-A-Cath right upper quadrant Abdomen: soft; nondistended; nontender; bowel sounds present Extremities: No deformity; full range of motion; pulses normal Neurologic: Awake, alert and oriented; motor function intact in all extremities and symmetric; no facial droop Skin: Warm and dry Psychiatric: Normal mood and affect   RESULTS  Summary of this visit's results, reviewed and interpreted by myself:   EKG Interpretation  Date/Time:    Ventricular Rate:    PR Interval:    QRS Duration:   QT Interval:    QTC Calculation:   R Axis:     Text Interpretation:         Laboratory Studies: Results for orders placed or performed during the hospital encounter of 04/01/21 (from the past 24 hour(s))  Lipase, blood     Status: None   Collection Time: 04/01/21 11:04 PM  Result Value Ref Range   Lipase 33 11 - 51 U/L  Comprehensive metabolic panel     Status: Abnormal   Collection Time: 04/01/21 11:04 PM  Result Value Ref Range   Sodium 140 135 - 145 mmol/L   Potassium 4.1 3.5 - 5.1 mmol/L   Chloride 109 98 - 111 mmol/L   CO2 23 22 - 32 mmol/L   Glucose, Bld 115 (H) 70 - 99 mg/dL   BUN 32 (H) 6 - 20 mg/dL   Creatinine, Ser 1.15 (H) 0.44 - 1.00 mg/dL   Calcium 9.0 8.9 - 10.3 mg/dL   Total Protein 6.8 6.5 - 8.1 g/dL   Albumin 4.1  3.5 - 5.0 g/dL   AST 14 (L) 15 - 41 U/L   ALT 11 0 - 44 U/L   Alkaline Phosphatase 74 38 - 126 U/L   Total Bilirubin 0.5 0.3 - 1.2 mg/dL   GFR, Estimated 58 (L) >60 mL/min   Anion gap 8 5 - 15  CBC     Status: Abnormal   Collection Time: 04/01/21 11:04 PM  Result Value Ref Range   WBC 7.5 4.0 - 10.5 K/uL   RBC 3.24 (L) 3.87 - 5.11 MIL/uL   Hemoglobin 10.3 (L) 12.0 - 15.0 g/dL   HCT 28.8 (L) 36.0 - 46.0 %   MCV 88.9 80.0 - 100.0 fL   MCH 31.8 26.0 - 34.0 pg   MCHC 35.8 30.0 - 36.0 g/dL   RDW 12.5 11.5 - 15.5 %   Platelets 154 150 - 400 K/uL   nRBC 0.0 0.0 - 0.2 %  Urinalysis, Routine w reflex microscopic     Status: Abnormal   Collection Time: 04/01/21 11:04 PM  Result Value Ref Range   Color, Urine YELLOW YELLOW   APPearance CLEAR CLEAR   Specific Gravity, Urine 1.017 1.005 - 1.030   pH 5.0 5.0 - 8.0   Glucose, UA NEGATIVE NEGATIVE mg/dL   Hgb urine dipstick NEGATIVE NEGATIVE   Bilirubin Urine NEGATIVE NEGATIVE   Ketones, ur NEGATIVE NEGATIVE mg/dL   Protein, ur NEGATIVE NEGATIVE mg/dL   Nitrite NEGATIVE NEGATIVE   Leukocytes,Ua TRACE (A) NEGATIVE   RBC / HPF 0-5 0 - 5 RBC/hpf   WBC, UA 0-5 0 - 5 WBC/hpf   Bacteria, UA RARE (A) NONE SEEN   Squamous Epithelial / LPF 0-5 0 - 5   Mucus PRESENT   Pregnancy, urine     Status: None   Collection Time: 04/01/21 11:04  PM  Result Value Ref Range   Preg Test, Ur NEGATIVE NEGATIVE   Imaging Studies: CT ABDOMEN PELVIS W CONTRAST  Result Date: 04/02/2021 CLINICAL DATA:  Chronic immunosuppression, history of liver transplantation, acute nonlocalized abdominal pain. EXAM: CT ABDOMEN AND PELVIS WITH CONTRAST TECHNIQUE: Multidetector CT imaging of the abdomen and pelvis was performed using the standard protocol following bolus administration of intravenous contrast. RADIATION DOSE REDUCTION: This exam was performed according to the departmental dose-optimization program which includes automated exposure control, adjustment of the mA  and/or kV according to patient size and/or use of iterative reconstruction technique. CONTRAST:  59mL OMNIPAQUE IOHEXOL 300 MG/ML  SOLN COMPARISON:  01/26/2019 FINDINGS: Lower chest: The visualized lung bases are clear. Central venous catheter tip noted within the a low right atrium. Cardiac size within normal limits. Small gastroesophageal varices are noted. Hepatobiliary: Interval orthotopic liver transplantation. Anastomotic staple line noted at the inferior cavoatrial junction. Main portal vein and intrahepatic portal venous system appears patent. Hepatic veins are patent. The hepatic artery is not well assessed due to the phase of contrast administration. Mild pneumobilia present. Multiple internal biliary stents are in place extending from the intrahepatic right biliary tree and biliary confluence into the second portion of the duodenum. Normal enhancement of the hepatic parenchyma; no enhancing intrahepatic mass identified. Gallbladder absent. Pancreas: Unremarkable Spleen: Unremarkable Adrenals/Urinary Tract: Adrenal glands are unremarkable. Kidneys are normal, without renal calculi, focal lesion, or hydronephrosis. Bladder is unremarkable. Stomach/Bowel: Stomach is within normal limits. Appendix appears normal. No evidence of bowel wall thickening, distention, or inflammatory changes. No free intraperitoneal gas or fluid. Vascular/Lymphatic: Moderate aortoiliac atherosclerotic calcification. No aortic aneurysm. No pathologic adenopathy within the abdomen and pelvis. Reproductive: 3.8 x 6.0 cm simple appearing cyst noted within the right ovary. Pelvic organs are otherwise unremarkable. Other: Tiny fat containing umbilical hernia.  Rectum unremarkable. Musculoskeletal: No acute bone abnormality. No lytic or blastic bone lesion. IMPRESSION: Interval orthotopic liver transplantation. Multiple internal biliary stents in place traversing the sphincter of Oddi and resulting in mild pneumobilia. No intrahepatic  biliary ductal dilation. Normal parenchymal enhancement. Wide patency of the portal and hepatic venous structures. The hepatic artery is not well visualized due to phase of contrast administration and, if indicated, this would be better assessed with hepatic Doppler sonography. Small residual gastroesophageal varices. 6 cm right ovarian simple-appearing cyst. Recommend follow-up pelvic US in 6-12 months. Reference: JACR 2020 Feb;17(2):248-254 No acute intra-abdominal pathology identified. Aortic Atherosclerosis (ICD10-I70.0). Electronically Signed   By: Fidela Salisbury M.D.   On: 04/02/2021 02:54   US PELVIC COMPLETE W TRANSVAGINAL AND TORSION R/O  Result Date: 04/02/2021 CLINICAL DATA:  Pelvic cramping, vomiting.  Postmenopausal EXAM: TRANSABDOMINAL AND TRANSVAGINAL ULTRASOUND OF PELVIS DOPPLER ULTRASOUND OF OVARIES TECHNIQUE: Both transabdominal and transvaginal ultrasound examinations of the pelvis were performed. Transabdominal technique was performed for global imaging of the pelvis including uterus, ovaries, adnexal regions, and pelvic cul-de-sac. It was necessary to proceed with endovaginal exam following the transabdominal exam to visualize the endometrium and ovaries. Color and duplex Doppler ultrasound was utilized to evaluate blood flow to the ovaries. COMPARISON:  None. FINDINGS: Uterus Measurements: 5.4 x 2.2 x 4.2 cm = volume: 26 mL. No fibroids or other mass visualized. Endometrium Thickness: 3 mm.  No focal abnormality visualized. Right ovary Measurements: 4.7 x 4.5 x 4.6 cm = volume: 52 mL. The right ovary contains a a simple cyst measuring 4.3 x 3.9 x 4.0 cm. No internal septation, mural nodularity, or papillary processes are identified. Left  ovary Not visualized.  No adnexal mass. Pulsed Doppler evaluation of the right ovary demonstrates normal low-resistance arterial and venous waveforms. Other findings Trace free intraperitoneal fluid within the pelvis IMPRESSION: Nonvisualization of the left  ovary. 4.3 cm right ovarian benign functional cyst. Recommend follow-up pelvic US in 3-6 months. Reference: Radiology 2019 Nov;293(2):359-371 Electronically Signed   By: Fidela Salisbury M.D.   On: 04/02/2021 01:36    ED COURSE and MDM  Nursing notes, initial and subsequent vitals signs, including pulse oximetry, reviewed and interpreted by myself.  Vitals:   04/02/21 0030 04/02/21 0120 04/02/21 0200 04/02/21 0300  BP: (!) 165/94 136/74 109/62 (!) 147/85  Pulse: 75 75 66 88  Resp: 17 17 16 15   Temp:      TempSrc:      SpO2: 100% 99% 96% 99%  Weight:      Height:       Medications  heparin lock flush 100 unit/mL (has no administration in time range)  ondansetron (ZOFRAN) injection 4 mg (4 mg Intravenous Given 04/02/21 0029)  HYDROmorphone (DILAUDID) injection 1 mg (1 mg Intravenous Given 04/02/21 0032)  prochlorperazine (COMPAZINE) injection 10 mg (10 mg Intravenous Given 04/02/21 0117)  0.9 %  sodium chloride infusion ( Intravenous New Bag/Given 04/02/21 0204)  iohexol (OMNIPAQUE) 300 MG/ML solution 80 mL (80 mLs Intravenous Contrast Given 04/02/21 0224)   1:46 AM Pelvic ultrasound shows right ovarian cyst, likely benign.  Because patient is immunosuppressed she could have an infection that is not manifesting itself as it would in an immunocompetent person.  Therefore we will proceed with a CT scan of the abdomen and pelvis.  2:59 AM No significant abdominal pathology seen on CT scan which is reassuring.  The transplanted liver appears to be doing well.  Again the right ovarian cyst is seen and this is likely the cause of her pain.  She will follow-up with her OB/GYN.   PROCEDURES  Procedures   ED DIAGNOSES     ICD-10-CM   1. Right ovarian cyst  N83.201                     Shanon Rosser, MD 04/02/21 [id number]

## 2021-04-03 ENCOUNTER — Other Ambulatory Visit (HOSPITAL_COMMUNITY): Payer: Self-pay

## 2021-04-04 ENCOUNTER — Other Ambulatory Visit (HOSPITAL_COMMUNITY): Payer: Self-pay

## 2021-04-08 ENCOUNTER — Other Ambulatory Visit (HOSPITAL_COMMUNITY): Payer: Self-pay

## 2021-04-08 DIAGNOSIS — Z944 Liver transplant status: Principal | ICD-10-CM

## 2021-04-08 DIAGNOSIS — Z79899 Other long term (current) drug therapy: Principal | ICD-10-CM

## 2021-04-09 ENCOUNTER — Other Ambulatory Visit (HOSPITAL_COMMUNITY): Payer: Self-pay

## 2021-04-09 DIAGNOSIS — R87612 Low grade squamous intraepithelial lesion on cytologic smear of cervix (LGSIL): Secondary | ICD-10-CM | POA: Diagnosis not present

## 2021-04-09 DIAGNOSIS — N83201 Unspecified ovarian cyst, right side: Secondary | ICD-10-CM | POA: Diagnosis not present

## 2021-04-09 DIAGNOSIS — Z124 Encounter for screening for malignant neoplasm of cervix: Secondary | ICD-10-CM | POA: Diagnosis not present

## 2021-04-09 DIAGNOSIS — R29898 Other symptoms and signs involving the musculoskeletal system: Secondary | ICD-10-CM | POA: Diagnosis not present

## 2021-04-09 MED ORDER — PROGESTERONE 200 MG PO CAPS
ORAL_CAPSULE | ORAL | 11 refills | Status: DC
  Filled 2021-04-09: qty 12, 12d supply, fill #0

## 2021-04-11 ENCOUNTER — Ambulatory Visit: Payer: 59

## 2021-04-12 ENCOUNTER — Other Ambulatory Visit (HOSPITAL_COMMUNITY)
Admission: RE | Admit: 2021-04-12 | Discharge: 2021-04-12 | Disposition: A | Discharge status: Home / Self Care | Payer: 59 | Source: Ambulatory Visit | Attending: Transplant Hepatology | Admitting: Transplant Hepatology

## 2021-04-12 DIAGNOSIS — Z944 Liver transplant status: Secondary | ICD-10-CM | POA: Diagnosis not present

## 2021-04-12 DIAGNOSIS — Z79899 Other long term (current) drug therapy: Secondary | ICD-10-CM | POA: Insufficient documentation

## 2021-04-12 LAB — CBC WITH DIFFERENTIAL/PLATELET
Abs Immature Granulocytes: 0.02 10*3/uL (ref 0.00–0.07)
Basophils Absolute: 0 10*3/uL (ref 0.0–0.1)
Basophils Relative: 0 %
Eosinophils Absolute: 0.3 10*3/uL (ref 0.0–0.5)
Eosinophils Relative: 6 %
HCT: 27.1 % — ABNORMAL LOW (ref 36.0–46.0)
Hemoglobin: 9.7 g/dL — ABNORMAL LOW (ref 12.0–15.0)
Immature Granulocytes: 0 %
Lymphocytes Relative: 19 %
Lymphs Abs: 0.9 10*3/uL (ref 0.7–4.0)
MCH: 33 pg (ref 26.0–34.0)
MCHC: 35.8 g/dL (ref 30.0–36.0)
MCV: 92.2 fL (ref 80.0–100.0)
Monocytes Absolute: 0.4 10*3/uL (ref 0.1–1.0)
Monocytes Relative: 9 %
Neutro Abs: 3.2 10*3/uL (ref 1.7–7.7)
Neutrophils Relative %: 66 %
Platelets: 219 10*3/uL (ref 150–400)
RBC: 2.94 MIL/uL — ABNORMAL LOW (ref 3.87–5.11)
RDW: 12.2 % (ref 11.5–15.5)
WBC: 4.9 10*3/uL (ref 4.0–10.5)
nRBC: 0 % (ref 0.0–0.2)

## 2021-04-12 LAB — COMPREHENSIVE METABOLIC PANEL
ALT: 11 U/L (ref 0–44)
AST: 16 U/L (ref 15–41)
Albumin: 3.6 g/dL (ref 3.5–5.0)
Alkaline Phosphatase: 85 U/L (ref 38–126)
Anion gap: 11 (ref 5–15)
BUN: 22 mg/dL — ABNORMAL HIGH (ref 6–20)
CO2: 21 mmol/L — ABNORMAL LOW (ref 22–32)
Calcium: 9.2 mg/dL (ref 8.9–10.3)
Chloride: 108 mmol/L (ref 98–111)
Creatinine, Ser: 1.15 mg/dL — ABNORMAL HIGH (ref 0.44–1.00)
GFR, Estimated: 58 mL/min — ABNORMAL LOW (ref >60)
Glucose, Bld: 106 mg/dL — ABNORMAL HIGH (ref 70–99)
Potassium: 5.1 mmol/L (ref 3.5–5.1)
Sodium: 140 mmol/L (ref 135–145)
Total Bilirubin: 0.5 mg/dL (ref 0.3–1.2)
Total Protein: 6.9 g/dL (ref 6.5–8.1)

## 2021-04-12 LAB — BILIRUBIN, DIRECT: Bilirubin, Direct: <0.1 mg/dL (ref 0.0–0.2)

## 2021-04-12 LAB — MAGNESIUM: Magnesium: 1.6 mg/dL — ABNORMAL LOW (ref 1.7–2.4)

## 2021-04-12 LAB — GAMMA GT: GGT: 11 U/L (ref 7–50)

## 2021-04-12 LAB — PHOSPHORUS: Phosphorus: 3.9 mg/dL (ref 2.5–4.6)

## 2021-04-15 ENCOUNTER — Other Ambulatory Visit (HOSPITAL_COMMUNITY): Payer: Self-pay

## 2021-04-15 ENCOUNTER — Ambulatory Visit: Admit: 2021-04-15 | Discharge: 2021-04-15 | Payer: PRIVATE HEALTH INSURANCE

## 2021-04-15 ENCOUNTER — Encounter
Admit: 2021-04-15 | Discharge: 2021-04-15 | Payer: PRIVATE HEALTH INSURANCE | Attending: Student in an Organized Health Care Education/Training Program | Primary: Student in an Organized Health Care Education/Training Program

## 2021-04-15 DIAGNOSIS — Z944 Liver transplant status: Principal | ICD-10-CM

## 2021-04-15 DIAGNOSIS — Z792 Long term (current) use of antibiotics: Principal | ICD-10-CM

## 2021-04-15 DIAGNOSIS — Z79899 Other long term (current) drug therapy: Principal | ICD-10-CM

## 2021-04-15 DIAGNOSIS — F32A Depression, unspecified: Secondary | ICD-10-CM | POA: Diagnosis not present

## 2021-04-15 DIAGNOSIS — K219 Gastro-esophageal reflux disease without esophagitis: Secondary | ICD-10-CM | POA: Diagnosis not present

## 2021-04-15 DIAGNOSIS — N189 Chronic kidney disease, unspecified: Secondary | ICD-10-CM | POA: Diagnosis not present

## 2021-04-15 DIAGNOSIS — T85590A Other mechanical complication of bile duct prosthesis, initial encounter: Secondary | ICD-10-CM | POA: Diagnosis not present

## 2021-04-15 DIAGNOSIS — I2510 Atherosclerotic heart disease of native coronary artery without angina pectoris: Secondary | ICD-10-CM | POA: Diagnosis not present

## 2021-04-15 DIAGNOSIS — K8050 Calculus of bile duct without cholangitis or cholecystitis without obstruction: Secondary | ICD-10-CM | POA: Diagnosis not present

## 2021-04-15 DIAGNOSIS — Z09 Encounter for follow-up examination after completed treatment for conditions other than malignant neoplasm: Secondary | ICD-10-CM | POA: Diagnosis not present

## 2021-04-15 DIAGNOSIS — K831 Obstruction of bile duct: Secondary | ICD-10-CM | POA: Diagnosis not present

## 2021-04-15 DIAGNOSIS — I5032 Chronic diastolic (congestive) heart failure: Secondary | ICD-10-CM | POA: Diagnosis not present

## 2021-04-15 LAB — TACROLIMUS LEVEL: Tacrolimus (FK506) - LabCorp: 5.7 ng/mL (ref 2.0–20.0)

## 2021-04-15 MED ORDER — LEVOFLOXACIN 500 MG TABLET
ORAL_TABLET | 0 refills | 0 days | Status: CP
Start: 2021-04-15 — End: ?

## 2021-04-15 MED ORDER — LEVOFLOXACIN 500 MG PO TABS
ORAL_TABLET | ORAL | 0 refills | Status: DC
  Filled 2021-04-15: qty 5, 5d supply, fill #0

## 2021-04-16 ENCOUNTER — Other Ambulatory Visit (HOSPITAL_COMMUNITY): Payer: Self-pay

## 2021-04-17 ENCOUNTER — Other Ambulatory Visit (HOSPITAL_COMMUNITY): Payer: Self-pay

## 2021-04-17 DIAGNOSIS — Z944 Liver transplant status: Principal | ICD-10-CM

## 2021-04-17 DIAGNOSIS — K831 Obstruction of bile duct: Principal | ICD-10-CM

## 2021-04-17 DIAGNOSIS — K8020 Calculus of gallbladder without cholecystitis without obstruction: Principal | ICD-10-CM

## 2021-04-17 DIAGNOSIS — T8649 Other complications of liver transplant: Principal | ICD-10-CM

## 2021-04-17 DIAGNOSIS — N83201 Unspecified ovarian cyst, right side: Secondary | ICD-10-CM | POA: Diagnosis not present

## 2021-04-17 MED ORDER — URSODIOL 300 MG CAPSULE
ORAL_CAPSULE | Freq: Three times a day (TID) | ORAL | 3 refills | 90.00000 days | Status: CP
Start: 2021-04-17 — End: 2022-04-17

## 2021-04-17 MED ORDER — URSODIOL 300 MG PO CAPS
ORAL_CAPSULE | ORAL | 3 refills | Status: DC
Start: 2021-04-17 — End: 2022-04-11
  Filled 2021-04-17: qty 270, 90d supply, fill #0
  Filled 2021-07-24: qty 119, 35d supply, fill #1
  Filled 2021-07-26: qty 151, 55d supply, fill #1
  Filled 2021-10-19: qty 270, 90d supply, fill #2
  Filled 2022-01-20: qty 270, 90d supply, fill #3

## 2021-04-18 ENCOUNTER — Other Ambulatory Visit (HOSPITAL_COMMUNITY): Payer: Self-pay

## 2021-04-19 ENCOUNTER — Other Ambulatory Visit (HOSPITAL_COMMUNITY): Payer: Self-pay

## 2021-04-19 MED ORDER — FINASTERIDE 5 MG PO TABS
5.0000 mg | ORAL_TABLET | Freq: Every day | ORAL | 3 refills | Status: DC
  Filled 2021-04-19: qty 90, 90d supply, fill #0
  Filled 2021-10-24: qty 90, 90d supply, fill #1
  Filled 2022-01-20: qty 90, 90d supply, fill #2
  Filled 2022-04-11 (×2): qty 90, 90d supply, fill #3

## 2021-04-22 DIAGNOSIS — Z79899 Other long term (current) drug therapy: Principal | ICD-10-CM

## 2021-04-22 DIAGNOSIS — Z944 Liver transplant status: Principal | ICD-10-CM

## 2021-04-24 ENCOUNTER — Other Ambulatory Visit (HOSPITAL_COMMUNITY): Payer: Self-pay

## 2021-04-29 DIAGNOSIS — Z79899 Other long term (current) drug therapy: Principal | ICD-10-CM

## 2021-04-29 DIAGNOSIS — Z944 Liver transplant status: Principal | ICD-10-CM

## 2021-04-29 NOTE — H&P (Signed)
51 year old G3 P 3 with persistent high grade cervical dysplasia here for TAH/BSO.  ?History of LEEP cone biopsy x 2 - now minimal vaginal portion of cervix left  ?Last ultrasound 5 cm complex cystic mass right ovary ca 125 was normal ? ?Past Medical History:  ?Diagnosis Date  ? (HFpEF) heart failure with preserved ejection fraction ([place]) 08/09/2019  ? Acute blood loss anemia 04/20/2017  ? Altered mental status 08/14/2019  ? Arthritis   ? right knee  ? Ascites--mild this admit 11/06/2017  ? Atypical chest pain 11/05/2017  ? Atypical squamous cells of undetermined significance (ASCUS) on Papanicolaou smear of cervix 08/05/2018  ? CAD (coronary artery disease)   ? a. s/p CABGx2 in 02/2017 (LAD not suitable for PCI), EF normal.  ? CAD (coronary artery disease), native coronary artery 03/23/2017  ? 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 Chronic low back pain   ? Cirrhosis ([place]) 03/16/2017  ? Coronary artery disease 03/24/2017  ? COVID-19 09/18/2020  ? Current episode of major depressive disorder without prior episode 03/13/2020  ? Dyslipidemia, goal LDL below 70 10/13/2017  ? Elevated LFTs 04/20/2017  ? Encephalopathy 09/29/2019  ? hepatic encephalopathy  ? Familial hyperlipidemia   ? GERD (gastroesophageal reflux disease)   ? GI bleed 02/01/2018  ? H/O LEEP 03/30/2019  ? H/O two vessel coronary artery bypass graft 03/30/2019  ? High grade squamous intraepithelial lesion (HGSIL) on cytologic smear of cervix 05/04/2019  ? History of gastrostomy, has currently [facility]) 07/12/2020  ? Formatting of this note might be different from the original. Patient underwent ERCP 07/11/20. Procedure c/b hepaticogastrostomy stent placement.  ? Hypertension   ? Hypo-osmolality and hyponatremia 03/30/2019  ? IBS (irritable bowel syndrome)   ? Other insomnia 09/05/2019  ? Pancytopenia ([place]) 09/28/2019  ? PONV (postoperative nausea and vomiting)   ? i always throw up on waking up , but last EGD in march had no issues    ? PONV (postoperative nausea and  vomiting)   ? likes zofran and steroid to help does not like scopolamine patch  ? Port-A-Cath in place 11/21/2019  ? 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 Formatting of this note might be different from the original. IMOUPDATE  ? Primary biliary cirrhosis ([place])   ? cirhosis/liver disease followed by transplant team led by Dr Manuella Ghazi at [facility]   ? S/P CABG (coronary artery bypass graft)   ? S/P CABG x 2 03/24/2017  ? LIMA to DIAGONAL Portion of SVG/LEFT RADIAL to LAD  ? S/P LEEP (loop electrosurgical excision procedure) 05/11/2019  ? Status post liver transplantation ([place]) 03/30/2019  ? SVD (spontaneous vaginal delivery)   ? x 3  ? Upper GI bleed 02/01/2018  ? Urinary incontinence   ? occ  ? Urinary tract bacterial infections last oct 2021  ? Wears glasses   ? ?Past Surgical History:  ?Procedure Laterality Date  ? CERVICAL CONIZATION W/BX N/A 03/22/2020  ? Procedure: CONIZATION CERVIX WITH BIOPSY;  Surgeon: Dian Queen, MD;  Location: [facility];  Service: Gynecology;  Laterality: N/A;  ? CHOLECYSTECTOMY  09/2019  ? COLPOSCOPY N/A 03/22/2020  ? Procedure: COLPOSCOPY;  Surgeon: Dian Queen, MD;  Location: [facility];  Service: Gynecology;  Laterality: N/A;  ? CORONARY ARTERY BYPASS GRAFT N/A 03/24/2017  ? Procedure: CORONARY ARTERY BYPASS GRAFTING (CABG) x two, using left internal mammary artery, left radial artery, and right leg greater saphenous vein harvested endoscopically;  Surgeon: Prescott Gum,  Collier Salina, MD;  Location: [place];  Service: Open Heart Surgery;  Laterality: N/A;  ? ENDOVEIN HARVEST OF GREATER SAPHENOUS VEIN Right 03/24/2017  ? Procedure: ENDOVEIN HARVEST OF GREATER SAPHENOUS VEIN;  Surgeon: Ivin Poot, MD;  Location: [place];  Service: Open Heart Surgery;  Laterality: Right;  ? ESOPHAGEAL BANDING  02/01/2018  ? Procedure: ESOPHAGEAL BANDING;  Surgeon: [name], MD;  Location: [facility];  Service: Gastroenterology;;  ? ESOPHAGEAL BANDING N/A 03/29/2018  ?  Procedure: ESOPHAGEAL BANDING;  Surgeon: [name], MD;  Location: [facility];  Service: Gastroenterology;  Laterality: N/A;  ? ESOPHAGEAL BANDING N/A 05/21/2018  ? Procedure: ESOPHAGEAL BANDING;  Surgeon: [name], MD;  Location: [facility];  Service: Gastroenterology;  Laterality: N/A;  ? ESOPHAGEAL BANDING N/A 10/11/2018  ? Procedure: ESOPHAGEAL BANDING;  Surgeon: [name], MD;  Location: [facility];  Service: Gastroenterology;  Laterality: N/A;  ? ESOPHAGOGASTRODUODENOSCOPY N/A 03/29/2018  ? Procedure: ESOPHAGOGASTRODUODENOSCOPY (EGD);  Surgeon: [name], MD;  Location: [facility];  Service: Gastroenterology;  Laterality: N/A;  ? ESOPHAGOGASTRODUODENOSCOPY N/A 05/21/2018  ? Procedure: ESOPHAGOGASTRODUODENOSCOPY (EGD);  Surgeon: [name], MD;  Location: [facility];  Service: Gastroenterology;  Laterality: N/A;  ? ESOPHAGOGASTRODUODENOSCOPY (EGD) WITH PROPOFOL N/A 02/01/2018  ? Procedure: ESOPHAGOGASTRODUODENOSCOPY (EGD) WITH PROPOFOL;  Surgeon: [name], MD;  Location: [facility];  Service: Gastroenterology;  Laterality: N/A;  ? ESOPHAGOGASTRODUODENOSCOPY (EGD) WITH PROPOFOL N/A 10/11/2018  ? Procedure: ESOPHAGOGASTRODUODENOSCOPY (EGD) WITH PROPOFOL;  Surgeon: [name], MD;  Location: [facility];  Service: Gastroenterology;  Laterality: N/A;  ? INCONTINENCE SURGERY  2009  ? urinary  bladder sling  ? IR IMAGING GUIDED PORT INSERTION  04/08/2019  ? LEEP N/A 03/28/2014  ? Procedure: LOOP ELECTROSURGICAL EXCISION PROCEDURE (LEEP) cone biopsy;  Surgeon: Cyril Mourning, MD;  Location: [facility];  Service: Gynecology;  Laterality: N/A;  ? LEEP N/A 03/22/2020  ? Procedure: LOOP ELECTROSURGICAL EXCISION PROCEDURE (LEEP);  Surgeon: Dian Queen, MD;  Location: [facility];  Service: Gynecology;  Laterality: N/A;  ? LEFT HEART CATH AND CORONARY ANGIOGRAPHY N/A 03/23/2017  ? Procedure: LEFT HEART CATH AND CORONARY ANGIOGRAPHY;  Surgeon: Jettie Booze, MD;  Location: [place]  [facility];  Service: Cardiovascular;  Laterality: N/A;  ? LIVER BIOPSY    ? x 2  ? LIVER TRANSPLANT  08/ 24/2021  ? RADIAL ARTERY HARVEST Left 03/24/2017  ? Procedure: RADIAL ARTERY HARVEST;  Surgeon: Ivin Poot, MD;  Location: [place];  Service: Open Heart Surgery;  Laterality: Left;  ? TEE WITHOUT CARDIOVERSION N/A 03/24/2017  ? Procedure: TRANSESOPHAGEAL ECHOCARDIOGRAM (TEE);  Surgeon: Prescott Gum, Collier Salina, MD;  Location: [place];  Service: Open Heart Surgery;  Laterality: N/A;  ? WISDOM TOOTH EXTRACTION  yrs ago  ? ?Scheduled Meds: ?Continuous Infusions: ?PRN Meds:. ?Codeine, Erythromycin, and Fentanyl ? ?Family History  ?Problem Relation Age of Onset  ? CAD Father   ? Diabetes Mellitus II Father   ? Heart disease Father   ? Heart attack Brother   ? Heart disease Maternal Aunt   ? Heart attack Paternal Grandmother   ? Heart attack Paternal Grandfather   ? ?Social History  ? ?Socioeconomic History  ? Marital status: Divorced  ?  Spouse name: Not on file  ? Number of children: 3  ? Years of education: 33  ? Highest education level: Associate degree: academic program  ?Occupational History  ? Not on file  ?Tobacco Use  ? Smoking status: Never  ? Smokeless tobacco: Never  ?  Vaping Use  ? Vaping Use: Never used  ?Substance and Sexual Activity  ? Alcohol use: Not Currently  ?  Alcohol/week: 1.0 standard drink  ?  Types: 1 Glasses of wine per week  ? Drug use: No  ? Sexual activity: Yes  ?  Partners: Male  ?  Birth control/protection: None  ?Other Topics Concern  ? Not on file  ?Social History Narrative  ? Not on file  ? ?Social Determinants of Health  ? ?Financial Resource Strain: Not on file  ?Food Insecurity: Not on file  ?Transportation Needs: Not on file  ?Physical Activity: Not on file  ?Stress: Not on file  ?Social Connections: Not on file  ? ?General alert and oriented ?Lung CTAB ?Car RRR  ?Abdomen soft and non tender ?Pelvic as above ? ?IMPRESSION: ?Carcinoma In SItu of cervix ?Ovarian cyst on the  right ? ?PLAN: ?TAH/BSO ?Risks reviewed ?Consent signed ? ?

## 2021-04-30 ENCOUNTER — Other Ambulatory Visit (HOSPITAL_COMMUNITY): Payer: Self-pay

## 2021-05-01 ENCOUNTER — Other Ambulatory Visit (HOSPITAL_COMMUNITY): Payer: Self-pay

## 2021-05-02 DIAGNOSIS — D069 Carcinoma in situ of cervix, unspecified: Secondary | ICD-10-CM | POA: Diagnosis not present

## 2021-05-04 ENCOUNTER — Other Ambulatory Visit (HOSPITAL_COMMUNITY): Payer: Self-pay

## 2021-05-06 ENCOUNTER — Other Ambulatory Visit (HOSPITAL_COMMUNITY): Payer: Self-pay

## 2021-05-07 ENCOUNTER — Other Ambulatory Visit (HOSPITAL_COMMUNITY)
Admission: RE | Admit: 2021-05-07 | Discharge: 2021-05-07 | Disposition: A | Discharge status: Home / Self Care | Payer: 59 | Source: Ambulatory Visit | Attending: Transplant Hepatology | Admitting: Transplant Hepatology

## 2021-05-07 DIAGNOSIS — Z79899 Other long term (current) drug therapy: Secondary | ICD-10-CM | POA: Insufficient documentation

## 2021-05-07 DIAGNOSIS — Z944 Liver transplant status: Secondary | ICD-10-CM | POA: Diagnosis not present

## 2021-05-07 LAB — COMPREHENSIVE METABOLIC PANEL
ALT: 14 U/L (ref 0–44)
AST: 15 U/L (ref 15–41)
Albumin: 3.6 g/dL (ref 3.5–5.0)
Alkaline Phosphatase: 76 U/L (ref 38–126)
Anion gap: 7 (ref 5–15)
BUN: 29 mg/dL — ABNORMAL HIGH (ref 6–20)
CO2: 23 mmol/L (ref 22–32)
Calcium: 9 mg/dL (ref 8.9–10.3)
Chloride: 108 mmol/L (ref 98–111)
Creatinine, Ser: 1.21 mg/dL — ABNORMAL HIGH (ref 0.44–1.00)
GFR, Estimated: 54 mL/min — ABNORMAL LOW (ref >60)
Glucose, Bld: 107 mg/dL — ABNORMAL HIGH (ref 70–99)
Potassium: 4.6 mmol/L (ref 3.5–5.1)
Sodium: 138 mmol/L (ref 135–145)
Total Bilirubin: 0.4 mg/dL (ref 0.3–1.2)
Total Protein: 6.5 g/dL (ref 6.5–8.1)

## 2021-05-07 LAB — CBC WITH DIFFERENTIAL/PLATELET
Abs Immature Granulocytes: 0.01 10*3/uL (ref 0.00–0.07)
Basophils Absolute: 0 10*3/uL (ref 0.0–0.1)
Basophils Relative: 1 %
Eosinophils Absolute: 0.5 10*3/uL (ref 0.0–0.5)
Eosinophils Relative: 12 %
HCT: 27.5 % — ABNORMAL LOW (ref 36.0–46.0)
Hemoglobin: 9.8 g/dL — ABNORMAL LOW (ref 12.0–15.0)
Immature Granulocytes: 0 %
Lymphocytes Relative: 27 %
Lymphs Abs: 1.1 10*3/uL (ref 0.7–4.0)
MCH: 32.5 pg (ref 26.0–34.0)
MCHC: 35.6 g/dL (ref 30.0–36.0)
MCV: 91.1 fL (ref 80.0–100.0)
Monocytes Absolute: 0.3 10*3/uL (ref 0.1–1.0)
Monocytes Relative: 8 %
Neutro Abs: 2.2 10*3/uL (ref 1.7–7.7)
Neutrophils Relative %: 52 %
Platelets: 145 10*3/uL — ABNORMAL LOW (ref 150–400)
RBC: 3.02 MIL/uL — ABNORMAL LOW (ref 3.87–5.11)
RDW: 12.5 % (ref 11.5–15.5)
WBC: 4.2 10*3/uL (ref 4.0–10.5)
nRBC: 0 % (ref 0.0–0.2)

## 2021-05-07 LAB — BILIRUBIN, DIRECT: Bilirubin, Direct: 0.1 mg/dL (ref 0.0–0.2)

## 2021-05-07 LAB — PHOSPHORUS: Phosphorus: 4.4 mg/dL (ref 2.5–4.6)

## 2021-05-07 LAB — GAMMA GT: GGT: 11 U/L (ref 7–50)

## 2021-05-07 LAB — MAGNESIUM: Magnesium: 1.7 mg/dL (ref 1.7–2.4)

## 2021-05-08 NOTE — Pre-Procedure Instructions (Addendum)
Surgical Instructions ? ? ? Your procedure is scheduled on Monday, March 20. ? Report to [facility] Main Entrance "A" at 5:30 A.M., then check in with the Admitting office. ? Call this number if you have problems the morning of surgery: ? [phone] ? ? If you have any questions prior to your surgery date call [phone]: Open Monday-Friday 8am-4pm ? ? ? Remember: ? Do not eat or drink anything after midnight the night before your surgery ?  ? Take these medicines the morning of surgery with A SIP OF WATER:  ? ?  carvedilol (COREG) ?  ENVARSUS XR  ?gabapentin (NEURONTIN)  ?omeprazole (PRILOSEC)  ?ursodiol (ACTIGALL) ? ?IF NEEDED:  ?acetaminophen (TYLENOL) ?fluticasone (FLONASE) ?ondansetron [facility])  ? ?As of today, STOP taking any Aspirin (unless otherwise instructed by your surgeon) Aleve, Naproxen, Ibuprofen, Motrin, Advil, Goody's, BC's, all herbal medications, fish oil, and all vitamins. ? ?         ?Do not wear jewelry or makeup ?Do not wear lotions, powders, perfumes/colognes, or deodorant. ?Do not shave 48 hours prior to surgery.  Men may shave face and neck. ?Do not bring valuables to the hospital. ?Do not wear nail polish, gel polish, artificial nails, or any other type of covering on natural nails (fingers and toes) ?If you have artificial nails or gel coating that need to be removed by a nail salon, please have this removed prior to surgery. Artificial nails or gel coating may interfere with anesthesia's ability to adequately monitor your vital signs. ? ?[facility] is not responsible for any belongings or valuables. .  ? ?Do NOT Smoke (Tobacco/Vaping)  24 hours prior to your procedure ? ?If you use a CPAP at night, you may bring your mask for your overnight stay. ?  ?Contacts, glasses, hearing aids, dentures or partials may not be worn into surgery, please bring cases for these belongings ?  ?For patients admitted to the hospital, discharge time will be determined by your treatment team. ?   ?Patients discharged the day of surgery will not be allowed to drive home, and someone needs to stay with them for 24 hours. ? ?NO VISITORS WILL BE ALLOWED IN PRE-OP WHERE PATIENTS ARE PREPPED FOR SURGERY.  ONLY 1 SUPPORT PERSON MAY BE PRESENT IN THE WAITING ROOM WHILE YOU ARE IN SURGERY.  IF YOU ARE TO BE ADMITTED, ONCE YOU ARE IN YOUR ROOM YOU WILL BE ALLOWED TWO (2) VISITORS. 1 (ONE) VISITOR MAY STAY OVERNIGHT BUT MUST ARRIVE TO THE ROOM BY 8pm.  Minor children may have two parents present. Special consideration for safety and communication needs will be reviewed on a case by case basis. ? ?Special instructions:   ? ?Oral Hygiene is also important to reduce your risk of infection.  Remember - BRUSH YOUR TEETH THE MORNING OF SURGERY WITH YOUR REGULAR TOOTHPASTE ? ? ?[place]- Preparing For Surgery ? ?Before surgery, you can play an important role. Because skin is not sterile, your skin needs to be as free of germs as possible. You can reduce the number of germs on your skin by washing with CHG (chlorahexidine gluconate) Soap before surgery.  CHG is an antiseptic cleaner which kills germs and bonds with the skin to continue killing germs even after washing.   ? ? ?Please do not use if you have an allergy to CHG or antibacterial soaps. If your skin becomes reddened/irritated stop using the CHG.  ?Do not shave (including legs and underarms) for at least 48 hours prior  to first CHG shower. It is OK to shave your face. ? ?Please follow these instructions carefully. ?  ? ? Shower the NIGHT BEFORE SURGERY and the MORNING OF SURGERY with CHG Soap.  ? If you chose to wash your hair, wash your hair first as usual with your normal shampoo. After you shampoo, rinse your hair and body thoroughly to remove the shampoo.  Then [employer] and genitals (private parts) with your normal soap and rinse thoroughly to remove soap. ? ?After that Use CHG Soap as you would any other liquid soap. You can apply CHG directly to the skin  and wash gently with a scrungie or a clean washcloth.  ? ?Apply the CHG Soap to your body ONLY FROM THE NECK DOWN.  Do not use on open wounds or open sores. Avoid contact with your eyes, ears, mouth and genitals (private parts). Wash Face and genitals (private parts)  with your normal soap.  ? ?Wash thoroughly, paying special attention to the area where your surgery will be performed. ? ?Thoroughly rinse your body with warm water from the neck down. ? ?DO NOT shower/wash with your normal soap after using and rinsing off the CHG Soap. ? ?Pat yourself dry with a CLEAN TOWEL. ? ?Wear CLEAN PAJAMAS to bed the night before surgery ? ?Place CLEAN SHEETS on your bed the night before your surgery ? ?DO NOT SLEEP WITH PETS. ? ? ?Day of Surgery: ? ?Take a shower with CHG soap. ?Wear Clean/Comfortable clothing the morning of surgery ?Do not apply any deodorants/lotions.   ?Remember to brush your teeth WITH YOUR REGULAR TOOTHPASTE. ? ? ? ?COVID testing ? ?If you are going to stay overnight or be admitted after your procedure/surgery and require a pre-op COVID test, please follow these instructions after your COVID test  ? ?You are not required to quarantine however you are required to wear a well-fitting mask when you are out and around people not in your household.  If your mask becomes wet or soiled, replace with a new one. ? ?Wash your hands often with soap and water for 20 seconds or clean your hands with an alcohol-based hand sanitizer that contains at least 60% alcohol. ? ?Do not share personal items. ? ?Notify your provider: ?if you are in close contact with someone who has COVID  ?or if you develop a fever of 100.4 or greater, sneezing, cough, sore throat, shortness of breath or body aches. ? ?  ?Please read over the following fact sheets that you were given.  ? ?

## 2021-05-09 ENCOUNTER — Other Ambulatory Visit (HOSPITAL_COMMUNITY): Payer: Self-pay

## 2021-05-09 ENCOUNTER — Encounter (HOSPITAL_COMMUNITY): Payer: Self-pay

## 2021-05-09 ENCOUNTER — Other Ambulatory Visit: Payer: Self-pay

## 2021-05-09 ENCOUNTER — Encounter (HOSPITAL_COMMUNITY)
Admission: RE | Admit: 2021-05-09 | Discharge: 2021-05-09 | Disposition: A | Discharge status: Home / Self Care | Payer: 59 | Source: Ambulatory Visit | Attending: Obstetrics and Gynecology | Admitting: Obstetrics and Gynecology

## 2021-05-09 DIAGNOSIS — Z951 Presence of aortocoronary bypass graft: Secondary | ICD-10-CM | POA: Diagnosis not present

## 2021-05-09 DIAGNOSIS — Z8616 Personal history of COVID-19: Secondary | ICD-10-CM | POA: Insufficient documentation

## 2021-05-09 DIAGNOSIS — D069 Carcinoma in situ of cervix, unspecified: Secondary | ICD-10-CM | POA: Insufficient documentation

## 2021-05-09 DIAGNOSIS — Z01812 Encounter for preprocedural laboratory examination: Secondary | ICD-10-CM | POA: Insufficient documentation

## 2021-05-09 DIAGNOSIS — Z944 Liver transplant status: Secondary | ICD-10-CM | POA: Insufficient documentation

## 2021-05-09 DIAGNOSIS — I5032 Chronic diastolic (congestive) heart failure: Secondary | ICD-10-CM | POA: Insufficient documentation

## 2021-05-09 DIAGNOSIS — I110 Hypertensive heart disease with heart failure: Secondary | ICD-10-CM | POA: Insufficient documentation

## 2021-05-09 DIAGNOSIS — I2510 Atherosclerotic heart disease of native coronary artery without angina pectoris: Secondary | ICD-10-CM | POA: Diagnosis not present

## 2021-05-09 LAB — TYPE AND SCREEN
ABO/RH(D): A POS
Antibody Screen: NEGATIVE

## 2021-05-09 LAB — PROTIME-INR
INR: 1.1 (ref 0.8–1.2)
Prothrombin Time: 13.9 seconds (ref 11.4–15.2)

## 2021-05-09 LAB — TACROLIMUS LEVEL: Tacrolimus (FK506) - LabCorp: 7.8 ng/mL (ref 2.0–20.0)

## 2021-05-09 LAB — APTT: aPTT: 32 seconds (ref 24–36)

## 2021-05-09 NOTE — Progress Notes (Signed)
PCP - Dr. Earlie Server Scifres ?Cardiologist - Dr. Lennox Pippins and Dr. Debara Pickett  ? ?PPM/ICD - n/a ? ?Chest x-ray - n/a ?EKG - 01/08/21 ?Stress Test - 08/18/18 ?ECHO - 09/30/17 ?Cardiac Cath -03/23/17  ? ?Sleep Study - denies ?CPAP - denies ? ?Blood Thinner Instructions: n/a ?Aspirin Instructions: LD 3/12  ? ?ERAS Protcol -Clear liquids until 0430 DOS ?PRE-SURGERY Ensure or G2- none ordered ? ?COVID TEST- n/a ? ? ?Anesthesia review: Yes, hx of CAD ? ?Patient denies shortness of breath, fever, cough and chest pain at PAT appointment ? ? ?All instructions explained to the patient, with a verbal understanding of the material. Patient agrees to go over the instructions while at home for a better understanding. Patient also instructed to self quarantine after being tested for COVID-19. The opportunity to ask questions was provided. ? ? ?

## 2021-05-09 NOTE — Pre-Procedure Instructions (Signed)
Surgical Instructions ? ? ? Your procedure is scheduled on Monday, March 20. ? Report to [facility] Main Entrance "A" at 5:30 A.M., then check in with the Admitting office. ? Call this number if you have problems the morning of surgery: ? [phone] ? ? If you have any questions prior to your surgery date call [phone]: Open Monday-Friday 8am-4pm ? ? ? Remember: ? Do not eat anything after midnight the night before your surgery. ? ?You may drink clear liquids until 4:30 a.m. the morning of your surgery.   ?Clear liquids allowed are: Water, Non-Citrus Juices (without pulp), Carbonated Beverages, Clear Tea, Black Coffee Only (NO MILK, CREAM OR POWDERED CREAMER of any kind), and Gatorade. ?  ? Take these medicines the morning of surgery with A SIP OF WATER:  ? ?  carvedilol (COREG) ?  ENVARSUS XR  ?gabapentin (NEURONTIN)  ?omeprazole (PRILOSEC)  ?ursodiol (ACTIGALL) ? ?IF NEEDED:  ?acetaminophen (TYLENOL) ?fluticasone (FLONASE) ?ondansetron [facility])  ? ?As of today, STOP taking any Aspirin (unless otherwise instructed by your surgeon) Aleve, Naproxen, Ibuprofen, Motrin, Advil, Goody's, BC's, all herbal medications, fish oil, and all vitamins. ? ?         ?Do not wear jewelry or makeup ?Do not wear lotions, powders, perfumes, or deodorant. ?Do not shave 48 hours prior to surgery.   ?Do not bring valuables to the hospital. ?Do not wear nail polish, gel polish, artificial nails, or any other type of covering on natural nails (fingers and toes) ?If you have artificial nails or gel coating that need to be removed by a nail salon, please have this removed prior to surgery. Artificial nails or gel coating may interfere with anesthesia's ability to adequately monitor your vital signs. ? ?[facility] is not responsible for any belongings or valuables. .  ? ?Do NOT Smoke (Tobacco/Vaping)  24 hours prior to your procedure ? ?If you use a CPAP at night, you may bring your mask for your overnight stay. ?  ?Contacts, glasses,  hearing aids, dentures or partials may not be worn into surgery, please bring cases for these belongings ?  ?For patients admitted to the hospital, discharge time will be determined by your treatment team. ?  ?Patients discharged the day of surgery will not be allowed to drive home, and someone needs to stay with them for 24 hours. ? ?NO VISITORS WILL BE ALLOWED IN PRE-OP WHERE PATIENTS ARE PREPPED FOR SURGERY.  ONLY 1 SUPPORT PERSON MAY BE PRESENT IN THE WAITING ROOM WHILE YOU ARE IN SURGERY.  IF YOU ARE TO BE ADMITTED, ONCE YOU ARE IN YOUR ROOM YOU WILL BE ALLOWED TWO (2) VISITORS. 1 (ONE) VISITOR MAY STAY OVERNIGHT BUT MUST ARRIVE TO THE ROOM BY 8pm.  Minor children may have two parents present. Special consideration for safety and communication needs will be reviewed on a case by case basis. ? ?Special instructions:   ? ?Oral Hygiene is also important to reduce your risk of infection.  Remember - BRUSH YOUR TEETH THE MORNING OF SURGERY WITH YOUR REGULAR TOOTHPASTE ? ? ?[place]- Preparing For Surgery ? ?Before surgery, you can play an important role. Because skin is not sterile, your skin needs to be as free of germs as possible. You can reduce the number of germs on your skin by washing with CHG (chlorahexidine gluconate) Soap before surgery.  CHG is an antiseptic cleaner which kills germs and bonds with the skin to continue killing germs even after washing.   ? ? ?Please  do not use if you have an allergy to CHG or antibacterial soaps. If your skin becomes reddened/irritated stop using the CHG.  ?Do not shave (including legs and underarms) for at least 48 hours prior to first CHG shower. It is OK to shave your face. ? ?Please follow these instructions carefully. ?  ? ? Shower the NIGHT BEFORE SURGERY and the MORNING OF SURGERY with CHG Soap.  ? If you chose to wash your hair, wash your hair first as usual with your normal shampoo. After you shampoo, rinse your hair and body thoroughly to remove the shampoo.   Then [employer] and genitals (private parts) with your normal soap and rinse thoroughly to remove soap. ? ?After that Use CHG Soap as you would any other liquid soap. You can apply CHG directly to the skin and wash gently with a scrungie or a clean washcloth.  ? ?Apply the CHG Soap to your body ONLY FROM THE NECK DOWN.  Do not use on open wounds or open sores. Avoid contact with your eyes, ears, mouth and genitals (private parts). Wash Face and genitals (private parts)  with your normal soap.  ? ?Wash thoroughly, paying special attention to the area where your surgery will be performed. ? ?Thoroughly rinse your body with warm water from the neck down. ? ?DO NOT shower/wash with your normal soap after using and rinsing off the CHG Soap. ? ?Pat yourself dry with a CLEAN TOWEL. ? ?Wear CLEAN PAJAMAS to bed the night before surgery ? ?Place CLEAN SHEETS on your bed the night before your surgery ? ?DO NOT SLEEP WITH PETS. ? ? ?Day of Surgery: ? ?Take a shower with CHG soap. ?Wear Clean/Comfortable clothing the morning of surgery ?Do not apply any deodorants/lotions.   ?Remember to brush your teeth WITH YOUR REGULAR TOOTHPASTE. ? ? ? ?Notify your provider: ?if you are in close contact with someone who has COVID  ?or if you develop a fever of 100.4 or greater, sneezing, cough, sore throat, shortness of breath or body aches. ? ?  ?Please read over the following fact sheets that you were given.  ? ? ?

## 2021-05-10 NOTE — Progress Notes (Signed)
Anesthesia Chart Review: ? ? Case: [id number] Date/Time: 05/13/21 0715  ? Procedure: TOTAL ABDOMINAL HYSTERECTOMY, BILATERAL SALPINGO OPPHORECTOMY (Bilateral)  ? Anesthesia type: General  ? Pre-op diagnosis: CIN III  ? Location: [facility] OR ROOM 08 / [place] [facility]  ? Surgeons: Dian Queen, MD  ? ?  ? ? ?DISCUSSION: ?Pt is 51 years old with hx CAD (s/p CABG x2 2019), familial hyperlipidemia (markedly improved after liver transplant), HFpEF, HTN, primary biliary cirrhosis (s/p orthotopic liver transplant 10/18/19). ? 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 ?VS: BP 136/84   Pulse 73   Temp 36.8 ?C (Oral)   Resp 17   Ht '5\' 1"'$  (1.549 m)   Wt 77.8 kg   LMP 05/10/2018 (Approximate)   SpO2 100%   BMI 32.40 kg/m?  ? ?PROVIDERS: ?- PCP is Scifres, Durel Salts ?- Cardiologist is Jyl Heinz, MD. Last office visit 01/08/21 ?- Cardiologist Lyman Bishop, MD manages familial hyperlipidemia - last office visit 11/19/20 ?- Hepatologist/transplant provider Dorcas Mcmurray, MD (notes in care everywhere)  ? ?LABS: Labs reviewed: Acceptable for surgery. ?- CBC w/diff 05/07/21 (care everywhere): Hgb 9.8, platelets 145. This is consistent with prior results/pt's baseline ?- CMP 05/07/21 (care everywhere): Cr 1.21/BUN 29, glucose 107 ? ?(all labs ordered are listed, but only abnormal results are displayed) ? ?Labs Reviewed  ?PROTIME-INR  ?APTT  ?TYPE AND SCREEN  ? ? ? ?IMAGES: ?Liver US 07/12/20 (care everywhere):  ?-Hepatic artery resistive indices are increased/improved compared to prior, now upper-normal to minimally elevated. Improved tardus parvus waveforms in the right and left hepatic arteries. Recommend continued attention on follow-up imaging.  ?-Persistent increased velocity in the post anastomotic portal vein compared to the preanastomotic portal vein, however findings are improved compared to prior. Additionally, there is  persistent narrowing of the portal vein anastomosis.  ?-Hepatic segment 4B/5 lesion seen on recent MRI is not well evaluated on ultrasound. Stable small cystic focus in the right hepatic lobe.  ?-While the common bile duct is not well evaluated, the proximal common bile duct appears relatively decompressed compared to recent MRI images.  ?-Pneumobilia, likely postprocedural. Hepaticogastrostomy not well visualized.  ?-Small-volume ascites  ?-Trace upper pole caliectasis in the right kidney, similar to prior.  ?-Incidental note of 5.3 cm right adnexal cyst, stable to slightly increased since prior. Consider nonemergent follow-up transvaginal ultrasound for further evaluation.  ? ? ?EKG 01/08/21: NSR ? ? ?CV: ?Nuclear stress test 08/17/18:  ?Nuclear stress EF: 60%. ?There was no ST segment deviation noted during stress. ?Defect 1: There is a small defect of mild severity present in the apical inferior location. This was seen on prior study and is consistent with diaphragmatic attenuation. ?The study is normal. ?This is a low risk study. ?The left ventricular ejection fraction is normal (55-65%). ? ?Echo 09/30/17:  ?- Left ventricle: The cavity size was normal. Systolic function was normal. The estimated ejection fraction was in the range of 55% to 60%. Wall motion was normal; there were no regional wall motion abnormalities. Left ventricular diastolic function parameters were normal.  ?- Aortic valve: Valve area (Vmax): 2.35 cm^2.  ?- Left atrium: The atrium was moderately dilated.  ?- Right ventricle: The cavity size was mildly dilated. Wall thickness was normal.  ?- Right atrium: The atrium was mildly dilated.  ?- Impressions: Normal LVEF. LA moderate enlargement. RA, RV mild enlargement. Normal PAP. Trace MR. MIld TR. ? ? ?  Past Medical History:  ?Diagnosis Date  ? (HFpEF) heart failure with preserved ejection fraction ([place]) 08/09/2019  ? Acute blood loss anemia 04/20/2017  ? Altered mental status 08/14/2019  ? Arthritis    ? right knee  ? Ascites--mild this admit 11/06/2017  ? Atypical chest pain 11/05/2017  ? Atypical squamous cells of undetermined significance (ASCUS) on Papanicolaou smear of cervix 08/05/2018  ? CAD (coronary artery disease)   ? a. s/p CABGx2 in 02/2017 (LAD not suitable for PCI), EF normal.  ? CAD (coronary artery disease), native coronary artery 03/23/2017  ? 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 Chronic low back pain   ? Cirrhosis ([place]) 03/16/2017  ? Coronary artery disease 03/24/2017  ? COVID-19 09/18/2020  ? Current episode of major depressive disorder without prior episode 03/13/2020  ? Dyslipidemia, goal LDL below 70 10/13/2017  ? Elevated LFTs 04/20/2017  ? Encephalopathy 09/29/2019  ? hepatic encephalopathy  ? Familial hyperlipidemia   ? GERD (gastroesophageal reflux disease)   ? GI bleed 02/01/2018  ? H/O LEEP 03/30/2019  ? H/O two vessel coronary artery bypass graft 03/30/2019  ? High grade squamous intraepithelial lesion (HGSIL) on cytologic smear of cervix 05/04/2019  ? History of gastrostomy, has currently [facility]) 07/12/2020  ? Formatting of this note might be different from the original. Patient underwent ERCP 07/11/20. Procedure c/b hepaticogastrostomy stent placement.  ? Hypertension   ? Hypo-osmolality and hyponatremia 03/30/2019  ? IBS (irritable bowel syndrome)   ? Other insomnia 09/05/2019  ? Pancytopenia ([place]) 09/28/2019  ? PONV (postoperative nausea and vomiting)   ? i always throw up on waking up , but last EGD in march had no issues    ? PONV (postoperative nausea and vomiting)   ? likes zofran and steroid to help does not like scopolamine patch  ? Port-A-Cath in place 11/21/2019  ? 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 Formatting of this note might be different from the original. IMOUPDATE  ? Primary biliary cirrhosis ([place])   ? cirhosis/liver disease followed by transplant team led by Dr Manuella Ghazi at [facility]   ? S/P CABG (coronary artery bypass graft)   ? S/P CABG x 2 03/24/2017  ? LIMA to  DIAGONAL Portion of SVG/LEFT RADIAL to LAD  ? S/P LEEP (loop electrosurgical excision procedure) 05/11/2019  ? Status post liver transplantation ([place]) 03/30/2019  ? SVD (spontaneous vaginal delivery)   ? x 3  ? Upper GI bleed 02/01/2018  ? Urinary incontinence   ? occ  ? Urinary tract bacterial infections last oct 2021  ? Wears glasses   ? ? ?Past Surgical History:  ?Procedure Laterality Date  ? CERVICAL CONIZATION W/BX N/A 03/22/2020  ? Procedure: CONIZATION CERVIX WITH BIOPSY;  Surgeon: Dian Queen, MD;  Location: [facility];  Service: Gynecology;  Laterality: N/A;  ? CHOLECYSTECTOMY  09/2019  ? COLPOSCOPY N/A 03/22/2020  ? Procedure: COLPOSCOPY;  Surgeon: Dian Queen, MD;  Location: [facility];  Service: Gynecology;  Laterality: N/A;  ? CORONARY ARTERY BYPASS GRAFT N/A 03/24/2017  ? Procedure: CORONARY ARTERY BYPASS GRAFTING (CABG) x two, using left internal mammary artery, left radial artery, and right leg greater saphenous vein harvested endoscopically;  Surgeon: Ivin Poot, MD;  Location: [place];  Service: Open Heart Surgery;  Laterality: N/A;  ? ENDOVEIN HARVEST OF GREATER SAPHENOUS VEIN Right 03/24/2017  ? Procedure: ENDOVEIN HARVEST OF GREATER SAPHENOUS VEIN;  Surgeon: Ivin Poot, MD;  Location: [place];  Service: Open Heart Surgery;  Laterality: Right;  ? ESOPHAGEAL BANDING  02/01/2018  ? Procedure: ESOPHAGEAL BANDING;  Surgeon: [name], MD;  Location: [facility];  Service: Gastroenterology;;  ? ESOPHAGEAL BANDING N/A 03/29/2018  ? Procedure: ESOPHAGEAL BANDING;  Surgeon: [name], MD;  Location: [facility];  Service: Gastroenterology;  Laterality: N/A;  ? ESOPHAGEAL BANDING N/A 05/21/2018  ? Procedure: ESOPHAGEAL BANDING;  Surgeon: [name], MD;  Location: [facility];  Service: Gastroenterology;  Laterality: N/A;  ? ESOPHAGEAL BANDING N/A 10/11/2018  ? Procedure: ESOPHAGEAL BANDING;  Surgeon: [name], MD;  Location: [facility];  Service:  Gastroenterology;  Laterality: N/A;  ? ESOPHAGOGASTRODUODENOSCOPY N/A 03/29/2018  ? Procedure: ESOPHAGOGASTRODUODENOSCOPY (EGD);  Surgeon: [name], MD;  Location: [facility];  Service: Gastroenterology;  Laterality: N/A

## 2021-05-10 NOTE — Anesthesia Preprocedure Evaluation (Addendum)
Anesthesia Evaluation  ?Patient identified by MRN, date of birth, ID band ?Patient awake ? ? ? ?Reviewed: ?Allergy & Precautions, NPO status , Patient's Chart, lab work & pertinent test results ? ?History of Anesthesia Complications ?(+) PONV and history of anesthetic complications ? ?Airway ?Mallampati: II ? ?TM Distance: >3 FB ?Neck ROM: Full ? ? ? Dental ?no notable dental hx. ? ?  ?Pulmonary ?neg pulmonary ROS,  ?  ?Pulmonary exam normal ? ? ? ? ? ? ? Cardiovascular ?hypertension, Pt. on medications and Pt. on home beta blockers ?+ CAD, + CABG (2019) and +CHF  ? ?Rhythm:Regular Rate:Normal ? ? ?  ?Neuro/Psych ?Depression negative neurological ROS ?   ? GI/Hepatic ?GERD  Medicated,(+) Cirrhosis  ?  ?  ? , Primary biliary cirrhosis s/p transplant 08/21 ?  ?Endo/Other  ?negative endocrine ROS ? Renal/GU ?negative Renal ROS  ?Female GU complaint ?CIN ? ?  ?Musculoskeletal ? ?(+) Arthritis , Osteoarthritis,   ? Abdominal ?Normal abdominal exam  (+)   ?Peds ? Hematology ? ?(+) Blood dyscrasia, anemia ,   ?Anesthesia Other Findings ? ? Reproductive/Obstetrics ? ?  ? ? ? ? ? ? ? ? ? ? ? ? ? ?  ?  ? ? ? ? ? ? ?Anesthesia Physical ?Anesthesia Plan ? ?ASA: 3 ? ?Anesthesia Plan: General  ? ?Post-op Pain Management: Tylenol PO (pre-op)* and Celebrex PO (pre-op)*  ? ?Induction: Intravenous ? ?PONV Risk Score and Plan: 4 or greater and Ondansetron, Aprepitant, Dexamethasone, Midazolam, Scopolamine patch - Pre-op, Treatment may vary due to age or medical condition and Amisulpride ? ?Airway Management Planned: Mask and Oral ETT ? ?Additional Equipment: None ? ?Intra-op Plan:  ? ?Post-operative Plan: Extubation in OR ? ?Informed Consent: I have reviewed the patients History and Physical, chart, labs and discussed the procedure including the risks, benefits and alternatives for the proposed anesthesia with the patient or authorized representative who has indicated his/her understanding and  acceptance.  ? ? ? ?Dental advisory given ? ?Plan Discussed with: CRNA ? ?Anesthesia Plan Comments: (See APP note by Durel Salts, FNP  ? ?Lab Results ?     Component                Value               Date                 ?     WBC                      4.2                 05/07/2021           ?     HGB                      9.8 (L)             05/07/2021           ?     HCT                      27.5 (L)            05/07/2021           ?     MCV  91.1                05/07/2021           ?     PLT                      145 (L)             05/07/2021           ?Lab Results ?     Component                Value               Date                 ?     NA                       138                 05/07/2021           ?     K                        4.6                 05/07/2021           ?     CO2                      23                  05/07/2021           ?     GLUCOSE                  107 (H)             05/07/2021           ?     BUN                      29 (H)              05/07/2021           ?     CREATININE               1.21 (H)            05/07/2021           ?     CALCIUM                  9.0                 05/07/2021           ?     GFRNONAA                 54 (L)              05/07/2021           ?Nuclear stress test 08/17/18:  ?? Nuclear stress EF: 60%. ?? There was no ST segment deviation noted during stress. ?? Defect 1: There is a small defect of mild severity present in the apical inferior location. This was seen on prior study and is consistent with diaphragmatic attenuation. ?? The study is  normal. ?? This is a low risk study. ?? The left ventricular ejection fraction is normal (55-65%). ?? ?Echo 09/30/17:  ?- Left ventricle: The cavity size was normal. Systolic function was normal. The estimated ejection fraction was in the range of 55% to 60%. Wall motion was normal; there were no regional wall motion abnormalities. Left ventricular diastolic function parameters were normal.  ?- Aortic  valve: Valve area (Vmax): 2.35 cm^2.  ?- Left atrium: The atrium was moderately dilated.  ?- Right ventricle: The cavity size was mildly dilated. Wall thickness was normal.  ?- Right atrium: The atrium was mildly dilated.  ?- Impressions: Normal LVEF. LA moderate enlargement. RA, RV mild enlargement. Normal PAP. Trace MR.?MIld TR. ??)  ? ? ? ? ?Anesthesia Quick Evaluation ? ?

## 2021-05-13 ENCOUNTER — Encounter (HOSPITAL_COMMUNITY): Payer: Self-pay | Admitting: Obstetrics and Gynecology

## 2021-05-13 ENCOUNTER — Inpatient Hospital Stay (HOSPITAL_COMMUNITY): Payer: 59 | Admitting: Emergency Medicine

## 2021-05-13 ENCOUNTER — Encounter (HOSPITAL_COMMUNITY)
Admission: RE | Disposition: A | Discharge status: Home / Self Care | Payer: Self-pay | Source: Home / Self Care | Attending: Obstetrics and Gynecology

## 2021-05-13 ENCOUNTER — Other Ambulatory Visit (HOSPITAL_COMMUNITY): Payer: Self-pay

## 2021-05-13 ENCOUNTER — Other Ambulatory Visit: Payer: Self-pay

## 2021-05-13 ENCOUNTER — Inpatient Hospital Stay (HOSPITAL_COMMUNITY)
Admission: RE | Admit: 2021-05-13 | Discharge: 2021-05-14 | DRG: 740 | Disposition: A | Discharge status: Home / Self Care | Payer: 59 | Attending: Obstetrics and Gynecology | Admitting: Obstetrics and Gynecology

## 2021-05-13 ENCOUNTER — Inpatient Hospital Stay (HOSPITAL_COMMUNITY): Payer: 59 | Admitting: Anesthesiology

## 2021-05-13 DIAGNOSIS — N83521 Torsion of right fallopian tube: Secondary | ICD-10-CM | POA: Diagnosis not present

## 2021-05-13 DIAGNOSIS — R87613 High grade squamous intraepithelial lesion on cytologic smear of cervix (HGSIL): Principal | ICD-10-CM

## 2021-05-13 DIAGNOSIS — Z931 Gastrostomy status: Secondary | ICD-10-CM | POA: Diagnosis not present

## 2021-05-13 DIAGNOSIS — I2510 Atherosclerotic heart disease of native coronary artery without angina pectoris: Secondary | ICD-10-CM

## 2021-05-13 DIAGNOSIS — I10 Essential (primary) hypertension: Secondary | ICD-10-CM

## 2021-05-13 DIAGNOSIS — Z90710 Acquired absence of both cervix and uterus: Secondary | ICD-10-CM

## 2021-05-13 DIAGNOSIS — N838 Other noninflammatory disorders of ovary, fallopian tube and broad ligament: Secondary | ICD-10-CM | POA: Diagnosis not present

## 2021-05-13 DIAGNOSIS — Z951 Presence of aortocoronary bypass graft: Secondary | ICD-10-CM | POA: Diagnosis not present

## 2021-05-13 DIAGNOSIS — I509 Heart failure, unspecified: Secondary | ICD-10-CM | POA: Diagnosis not present

## 2021-05-13 DIAGNOSIS — Z944 Liver transplant status: Secondary | ICD-10-CM

## 2021-05-13 DIAGNOSIS — N858 Other specified noninflammatory disorders of uterus: Secondary | ICD-10-CM | POA: Diagnosis not present

## 2021-05-13 DIAGNOSIS — Z8616 Personal history of COVID-19: Secondary | ICD-10-CM | POA: Diagnosis not present

## 2021-05-13 DIAGNOSIS — I110 Hypertensive heart disease with heart failure: Secondary | ICD-10-CM | POA: Diagnosis present

## 2021-05-13 DIAGNOSIS — I5032 Chronic diastolic (congestive) heart failure: Secondary | ICD-10-CM | POA: Diagnosis present

## 2021-05-13 DIAGNOSIS — N736 Female pelvic peritoneal adhesions (postinfective): Secondary | ICD-10-CM | POA: Diagnosis not present

## 2021-05-13 DIAGNOSIS — N83201 Unspecified ovarian cyst, right side: Secondary | ICD-10-CM | POA: Diagnosis not present

## 2021-05-13 DIAGNOSIS — D069 Carcinoma in situ of cervix, unspecified: Secondary | ICD-10-CM | POA: Diagnosis not present

## 2021-05-13 DIAGNOSIS — Z90722 Acquired absence of ovaries, bilateral: Secondary | ICD-10-CM

## 2021-05-13 DIAGNOSIS — Z9079 Acquired absence of other genital organ(s): Secondary | ICD-10-CM

## 2021-05-13 HISTORY — PX: HYSTERECTOMY ABDOMINAL WITH SALPINGO-OOPHORECTOMY: SHX6792

## 2021-05-13 HISTORY — DX: Acquired absence of both cervix and uterus: Z90.710

## 2021-05-13 HISTORY — DX: Acquired absence of both cervix and uterus: Z90.79

## 2021-05-13 HISTORY — DX: Carcinoma in situ of cervix, unspecified: D06.9

## 2021-05-13 SURGERY — HYSTERECTOMY, ABDOMINAL, WITH SALPINGO-OOPHORECTOMY
Anesthesia: General | Site: Uterus | Laterality: Bilateral

## 2021-05-13 MED ORDER — HYDROMORPHONE HCL 1 MG/ML IJ SOLN
0.2000 mg | INTRAMUSCULAR | Status: DC | PRN
  Administered 2021-05-13: 0.6 mg via INTRAVENOUS
  Filled 2021-05-13: qty 1

## 2021-05-13 MED ORDER — CARVEDILOL 3.125 MG PO TABS
12.5000 mg | ORAL_TABLET | Freq: Two times a day (BID) | ORAL | Status: DC
  Administered 2021-05-13 – 2021-05-14 (×2): 12.5 mg via ORAL
  Filled 2021-05-13 (×2): qty 4

## 2021-05-13 MED ORDER — DEXAMETHASONE SODIUM PHOSPHATE 10 MG/ML IJ SOLN
INTRAMUSCULAR | Status: DC | PRN
  Administered 2021-05-13: 10 mg via INTRAVENOUS

## 2021-05-13 MED ORDER — 0.9 % SODIUM CHLORIDE (POUR BTL) OPTIME
TOPICAL | Status: DC | PRN
  Administered 2021-05-13: 1000 mL

## 2021-05-13 MED ORDER — BUPIVACAINE HCL (PF) 0.25 % IJ SOLN
INTRAMUSCULAR | Status: AC
  Filled 2021-05-13: qty 30

## 2021-05-13 MED ORDER — FENTANYL CITRATE (PF) 250 MCG/5ML IJ SOLN
INTRAMUSCULAR | Status: DC | PRN
  Administered 2021-05-13: 50 ug via INTRAVENOUS
  Administered 2021-05-13: 100 ug via INTRAVENOUS
  Administered 2021-05-13 (×2): 50 ug via INTRAVENOUS

## 2021-05-13 MED ORDER — URSODIOL 300 MG PO CAPS: 300.0000 mg | ORAL_CAPSULE | Freq: Every day | ORAL | Status: DC

## 2021-05-13 MED ORDER — LACTATED RINGERS IV SOLN: INTRAVENOUS | Status: DC

## 2021-05-13 MED ORDER — SODIUM CHLORIDE 0.9 % IV SOLN
25.0000 mg | Freq: Four times a day (QID) | INTRAVENOUS | Status: DC | PRN
  Administered 2021-05-13: 25 mg via INTRAVENOUS
  Filled 2021-05-13: qty 1

## 2021-05-13 MED ORDER — LIDOCAINE 2% (20 MG/ML) 5 ML SYRINGE
INTRAMUSCULAR | Status: DC | PRN
  Administered 2021-05-13: 60 mg via INTRAVENOUS

## 2021-05-13 MED ORDER — GABAPENTIN 300 MG PO CAPS
300.0000 mg | ORAL_CAPSULE | Freq: Every morning | ORAL | Status: DC
  Administered 2021-05-14: 300 mg via ORAL
  Filled 2021-05-13: qty 1

## 2021-05-13 MED ORDER — URSODIOL 300 MG PO CAPS
300.0000 mg | ORAL_CAPSULE | Freq: Three times a day (TID) | ORAL | Status: DC
  Administered 2021-05-13 – 2021-05-14 (×4): 300 mg via ORAL
  Filled 2021-05-13 (×7): qty 1

## 2021-05-13 MED ORDER — FINASTERIDE 2.5 MG HALF TABLET
2.5000 mg | ORAL_TABLET | Freq: Every evening | ORAL | Status: DC
  Administered 2021-05-13: 2.5 mg via ORAL
  Filled 2021-05-13 (×2): qty 1

## 2021-05-13 MED ORDER — ONDANSETRON HCL 4 MG/2ML IJ SOLN
4.0000 mg | Freq: Four times a day (QID) | INTRAMUSCULAR | Status: DC | PRN
  Administered 2021-05-13 (×2): 4 mg via INTRAVENOUS
  Filled 2021-05-13 (×2): qty 2

## 2021-05-13 MED ORDER — SIMETHICONE 80 MG PO CHEW: 80.0000 mg | CHEWABLE_TABLET | Freq: Four times a day (QID) | ORAL | Status: DC | PRN

## 2021-05-13 MED ORDER — DEXAMETHASONE SODIUM PHOSPHATE 10 MG/ML IJ SOLN
INTRAMUSCULAR | Status: AC
  Filled 2021-05-13: qty 1

## 2021-05-13 MED ORDER — APREPITANT 40 MG PO CAPS
40.0000 mg | ORAL_CAPSULE | Freq: Once | ORAL | Status: AC
  Administered 2021-05-13: 40 mg via ORAL
  Filled 2021-05-13: qty 1

## 2021-05-13 MED ORDER — CARVEDILOL 12.5 MG PO TABS
ORAL_TABLET | ORAL | Status: AC
  Filled 2021-05-13: qty 1

## 2021-05-13 MED ORDER — TACROLIMUS ER 4 MG PO TB24
4.0000 mg | ORAL_TABLET | Freq: Every day | ORAL | Status: DC
  Administered 2021-05-14: 4 mg via ORAL
  Filled 2021-05-13: qty 1

## 2021-05-13 MED ORDER — CARVEDILOL 12.5 MG PO TABS
12.5000 mg | ORAL_TABLET | Freq: Once | ORAL | Status: AC
  Administered 2021-05-13: 12.5 mg via ORAL

## 2021-05-13 MED ORDER — ROCURONIUM BROMIDE 10 MG/ML (PF) SYRINGE
PREFILLED_SYRINGE | INTRAVENOUS | Status: DC | PRN
  Administered 2021-05-13: 10 mg via INTRAVENOUS
  Administered 2021-05-13: 60 mg via INTRAVENOUS

## 2021-05-13 MED ORDER — CELECOXIB 200 MG PO CAPS: 200.0000 mg | ORAL_CAPSULE | Freq: Once | ORAL | Status: AC

## 2021-05-13 MED ORDER — FENTANYL CITRATE (PF) 250 MCG/5ML IJ SOLN
INTRAMUSCULAR | Status: AC
Start: 2021-05-13 — End: ?
  Filled 2021-05-13: qty 5

## 2021-05-13 MED ORDER — CHLORHEXIDINE GLUCONATE 0.12 % MT SOLN: 15.0000 mL | Freq: Once | OROMUCOSAL | Status: AC

## 2021-05-13 MED ORDER — SODIUM CHLORIDE 0.9 % IV SOLN
2.0000 g | INTRAVENOUS | Status: AC
  Administered 2021-05-13: 2 g via INTRAVENOUS
  Filled 2021-05-13: qty 2

## 2021-05-13 MED ORDER — ROCURONIUM BROMIDE 10 MG/ML (PF) SYRINGE
PREFILLED_SYRINGE | INTRAVENOUS | Status: AC
  Filled 2021-05-13: qty 10

## 2021-05-13 MED ORDER — SUGAMMADEX SODIUM 200 MG/2ML IV SOLN
INTRAVENOUS | Status: DC | PRN
  Administered 2021-05-13: 200 mg via INTRAVENOUS

## 2021-05-13 MED ORDER — ONDANSETRON HCL 4 MG/2ML IJ SOLN
INTRAMUSCULAR | Status: AC
  Filled 2021-05-13: qty 2

## 2021-05-13 MED ORDER — FENTANYL CITRATE (PF) 100 MCG/2ML IJ SOLN
INTRAMUSCULAR | Status: AC
  Filled 2021-05-13: qty 2

## 2021-05-13 MED ORDER — SENNOSIDES-DOCUSATE SODIUM 8.6-50 MG PO TABS: 1.0000 | ORAL_TABLET | Freq: Every evening | ORAL | Status: DC | PRN

## 2021-05-13 MED ORDER — ONDANSETRON HCL 4 MG/2ML IJ SOLN
INTRAMUSCULAR | Status: DC | PRN
  Administered 2021-05-13: 4 mg via INTRAVENOUS

## 2021-05-13 MED ORDER — OXYCODONE HCL 5 MG PO TABS
5.0000 mg | ORAL_TABLET | ORAL | Status: DC | PRN
  Administered 2021-05-13 – 2021-05-14 (×3): 5 mg via ORAL
  Filled 2021-05-13 (×3): qty 1

## 2021-05-13 MED ORDER — FENTANYL CITRATE (PF) 100 MCG/2ML IJ SOLN
25.0000 ug | INTRAMUSCULAR | Status: DC | PRN
  Administered 2021-05-13: 50 ug via INTRAVENOUS
  Administered 2021-05-13: 25 ug via INTRAVENOUS

## 2021-05-13 MED ORDER — ACETAMINOPHEN 500 MG PO TABS
1000.0000 mg | ORAL_TABLET | Freq: Once | ORAL | Status: AC
  Administered 2021-05-13: 1000 mg via ORAL
  Filled 2021-05-13: qty 2

## 2021-05-13 MED ORDER — MIDAZOLAM HCL 2 MG/2ML IJ SOLN
INTRAMUSCULAR | Status: DC | PRN
  Administered 2021-05-13: 2 mg via INTRAVENOUS

## 2021-05-13 MED ORDER — CHLORHEXIDINE GLUCONATE 0.12 % MT SOLN
OROMUCOSAL | Status: AC
  Administered 2021-05-13: 15 mL via OROMUCOSAL
  Filled 2021-05-13: qty 15

## 2021-05-13 MED ORDER — DOCUSATE SODIUM 100 MG PO CAPS
100.0000 mg | ORAL_CAPSULE | Freq: Two times a day (BID) | ORAL | Status: DC
  Filled 2021-05-13 (×2): qty 1

## 2021-05-13 MED ORDER — MIDAZOLAM HCL 2 MG/2ML IJ SOLN
INTRAMUSCULAR | Status: AC
  Filled 2021-05-13: qty 2

## 2021-05-13 MED ORDER — LIDOCAINE 2% (20 MG/ML) 5 ML SYRINGE
INTRAMUSCULAR | Status: AC
  Filled 2021-05-13: qty 5

## 2021-05-13 MED ORDER — ACETAMINOPHEN 10 MG/ML IV SOLN: 1000.0000 mg | Freq: Once | INTRAVENOUS | Status: DC | PRN

## 2021-05-13 MED ORDER — PANTOPRAZOLE SODIUM 40 MG PO TBEC
40.0000 mg | DELAYED_RELEASE_TABLET | Freq: Every day | ORAL | Status: DC
  Administered 2021-05-13 – 2021-05-14 (×2): 40 mg via ORAL
  Filled 2021-05-13 (×2): qty 1

## 2021-05-13 MED ORDER — ONDANSETRON HCL 4 MG PO TABS: 4.0000 mg | ORAL_TABLET | Freq: Four times a day (QID) | ORAL | Status: DC | PRN

## 2021-05-13 MED ORDER — TACROLIMUS ER 1 MG PO TB24
1.0000 mg | ORAL_TABLET | Freq: Every day | ORAL | Status: DC
  Filled 2021-05-13: qty 1

## 2021-05-13 MED ORDER — PROPOFOL 10 MG/ML IV BOLUS
INTRAVENOUS | Status: DC | PRN
  Administered 2021-05-13: 150 mg via INTRAVENOUS

## 2021-05-13 MED ORDER — PROPOFOL 10 MG/ML IV BOLUS
INTRAVENOUS | Status: AC
  Filled 2021-05-13: qty 20

## 2021-05-13 MED ORDER — HYDROMORPHONE HCL 1 MG/ML IJ SOLN
1.0000 mg | INTRAMUSCULAR | Status: DC | PRN
  Administered 2021-05-13 (×2): 1 mg via INTRAVENOUS
  Filled 2021-05-13 (×2): qty 1

## 2021-05-13 MED ORDER — CELECOXIB 200 MG PO CAPS
ORAL_CAPSULE | ORAL | Status: AC
  Administered 2021-05-13: 200 mg via ORAL
  Filled 2021-05-13: qty 1

## 2021-05-13 MED ORDER — ORAL CARE MOUTH RINSE: 15.0000 mL | Freq: Once | OROMUCOSAL | Status: AC

## 2021-05-13 MED ORDER — BUPIVACAINE HCL (PF) 0.25 % IJ SOLN
INTRAMUSCULAR | Status: DC | PRN
  Administered 2021-05-13: 10 mL

## 2021-05-13 MED ORDER — TACROLIMUS ER 4 MG PO TB24: 4.0000 mg | ORAL_TABLET | Freq: Every day | ORAL | Status: DC

## 2021-05-13 MED ORDER — MENTHOL 3 MG MT LOZG: 1.0000 | LOZENGE | OROMUCOSAL | Status: DC | PRN

## 2021-05-13 MED ORDER — AMISULPRIDE (ANTIEMETIC) 5 MG/2ML IV SOLN
INTRAVENOUS | Status: AC
  Filled 2021-05-13: qty 4

## 2021-05-13 MED ORDER — POVIDONE-IODINE 10 % EX SWAB
2.0000 "application " | Freq: Once | CUTANEOUS | Status: AC
  Administered 2021-05-13: 2 via TOPICAL

## 2021-05-13 MED ORDER — FINASTERIDE 5 MG PO TABS: 5.0000 mg | ORAL_TABLET | Freq: Every day | ORAL | Status: DC

## 2021-05-13 MED ORDER — AMISULPRIDE (ANTIEMETIC) 5 MG/2ML IV SOLN
10.0000 mg | Freq: Once | INTRAVENOUS | Status: AC | PRN
  Administered 2021-05-13: 10 mg via INTRAVENOUS

## 2021-05-13 SURGICAL SUPPLY — 36 items
APL SKNCLS STERI-STRIP NONHPOA (GAUZE/BANDAGES/DRESSINGS) ×1
BAG COUNTER SPONGE SURGICOUNT (BAG) ×2 IMPLANT
BAG SPNG CNTER NS LX DISP (BAG) ×1
BENZOIN TINCTURE PRP APPL 2/3 (GAUZE/BANDAGES/DRESSINGS) ×2 IMPLANT
CANISTER SUCT 3000ML PPV (MISCELLANEOUS) ×2 IMPLANT
DECANTER SPIKE VIAL GLASS SM (MISCELLANEOUS) IMPLANT
DRAPE CESAREAN BIRTH W POUCH (DRAPES) ×2 IMPLANT
DRAPE WARM FLUID 44X44 (DRAPES) IMPLANT
DRSG OPSITE POSTOP 4X10 (GAUZE/BANDAGES/DRESSINGS) ×2 IMPLANT
DURAPREP 26ML APPLICATOR (WOUND CARE) ×2 IMPLANT
GAUZE 4X4 16PLY ~~LOC~~+RFID DBL (SPONGE) ×4 IMPLANT
GLOVE SURG ENC MOIS LTX SZ6.5 (GLOVE) ×2 IMPLANT
GLOVE SURG UNDER POLY LF SZ7 (GLOVE) ×4 IMPLANT
GOWN STRL REUS W/ TWL LRG LVL3 (GOWN DISPOSABLE) ×3 IMPLANT
GOWN STRL REUS W/TWL LRG LVL3 (GOWN DISPOSABLE) ×6
HEMOSTAT ARISTA ABSORB 3G PWDR (HEMOSTASIS) IMPLANT
HIBICLENS CHG 4% 4OZ BTL (MISCELLANEOUS) ×2 IMPLANT
KIT TURNOVER KIT B (KITS) ×2 IMPLANT
NEEDLE HYPO 22GX1.5 SAFETY (NEEDLE) ×2 IMPLANT
NS IRRIG 1000ML POUR BTL (IV SOLUTION) ×2 IMPLANT
PACK ABDOMINAL GYN (CUSTOM PROCEDURE TRAY) ×2 IMPLANT
PAD OB MATERNITY 4.3X12.25 (PERSONAL CARE ITEMS) ×2 IMPLANT
SPONGE T-LAP 18X18 ~~LOC~~+RFID (SPONGE) IMPLANT
STRIP CLOSURE SKIN 1/2X4 (GAUZE/BANDAGES/DRESSINGS) ×2 IMPLANT
SUT PDS AB 0 CT 36 (SUTURE) IMPLANT
SUT PDS AB 0 CTX 60 (SUTURE) IMPLANT
SUT PLAIN 2 0 XLH (SUTURE) ×2 IMPLANT
SUT VIC AB 0 CT1 18XCR BRD8 (SUTURE) ×2 IMPLANT
SUT VIC AB 0 CT1 27 (SUTURE) ×8
SUT VIC AB 0 CT1 27XBRD ANBCTR (SUTURE) ×4 IMPLANT
SUT VIC AB 0 CT1 8-18 (SUTURE) ×6
SUT VIC AB 4-0 KS 27 (SUTURE) ×2 IMPLANT
SUT VICRYL 0 TIES 12 18 (SUTURE) ×2 IMPLANT
SYR CONTROL 10ML LL (SYRINGE) ×2 IMPLANT
TOWEL GREEN STERILE FF (TOWEL DISPOSABLE) ×4 IMPLANT
TRAY FOLEY W/BAG SLVR 14FR (SET/KITS/TRAYS/PACK) ×2 IMPLANT

## 2021-05-13 NOTE — Transfer of Care (Signed)
Immediate Anesthesia Transfer of Care Note ? ?Patient: Christine Butler ? ?Procedure(s) Performed: TOTAL ABDOMINAL HYSTERECTOMY, BILATERAL SALPINGO OPPHORECTOMY (Bilateral: Uterus) ? ?Patient Location: PACU ? ?Anesthesia Type:General ? ?Level of Consciousness: awake, patient cooperative and responds to stimulation ? ?Airway & Oxygen Therapy: Patient Spontanous Breathing and Patient connected to nasal cannula oxygen ? ?Post-op Assessment: Report given to RN, Post -op Vital signs reviewed and stable and Patient moving all extremities X 4 ? ?Post vital signs: Reviewed and stable ? ?Last Vitals:  ?Vitals Value Taken Time  ?BP 147/84 05/13/21 0856  ?Temp    ?Pulse 70 05/13/21 0857  ?Resp 17 05/13/21 0857  ?SpO2 100 % 05/13/21 0857  ?Vitals shown include unvalidated device data. ? ?Last Pain:  ?Vitals:  ? 05/13/21 0623  ?TempSrc:   ?PainSc: 0-No pain  ?   ? ?  ? ?Complications: No notable events documented. ?

## 2021-05-13 NOTE — Progress Notes (Signed)
H and P on the chart  ?No significant changes ?Will proceed with TAH/ BSO ?Questions answered ?Consent is signed ?No results found for this or any previous visit (from the past 24 hour(s)). ?BP (!) 145/79   Pulse 72   Temp 98.2 ?F (36.8 ?C) (Oral)   Resp 18   Ht '5\' 1"'$  (1.549 m)   Wt 77.1 kg   LMP 05/10/2018 (Approximate)   SpO2 98%   BMI 32.12 kg/m?  ? ?

## 2021-05-13 NOTE — Consult Note (Signed)
? ?  [facility] CM Inpatient Consult ? ? ?05/13/2021 ? ?[place] ?1970/03/28 ?[id number] ? ? [place] Patient: [place] plan ? ?Primary Care Provider:  Scifres, Janey Genta at [facility], an Embedded provider  ? ?Patient has an Embedded provider that does the TOC follow up and has a chronic care management [CCM] program and team. ? ? Plan: Continue to follow for progress and post hospital support needs. ?  ?For additional questions or referrals please contact: ?  ?Natividad Brood, RN BSN CCM ?[place] [facility] Liaison ? [phone] business mobile phone ?Toll free office [phone]  ?Fax number: [phone] ?[email or web address] ? ? ?

## 2021-05-13 NOTE — Brief Op Note (Signed)
05/13/2021 ? ?8:44 AM ? ?PATIENT:  Christine Butler  51 y.o. female ? ?PRE-OPERATIVE DIAGNOSIS:  ?Carcinoma In Situ of cervix ?Right adnexal mass ? ?POST-OPERATIVE DIAGNOSIS:   ?Same ?Possible torsion of right fallopian tube  ? ?PROCEDURE:  Procedure(s): ?TOTAL ABDOMINAL HYSTERECTOMY, BILATERAL SALPINGO OPPHORECTOMY (Bilateral) ? ?SURGEON:  Surgeon(s) and Role: ?   Dian Queen, MD - Primary ?   Linda Hedges, DO - Assisting ? ?PHYSICIAN ASSISTANT:  ? ?ASSISTANTS: none  ? ?ANESTHESIA:   local and general ? ?EBL:  200 mL  ? ?BLOOD ADMINISTERED:none ? ?DRAINS: Urinary Catheter (Foley)  ? ?LOCAL MEDICATIONS USED:  LIDOCAINE  ? ?SPECIMEN:  Source of Specimen:  uterus and cervix, left tube and ovary, right tube and ovary ? ?DISPOSITION OF SPECIMEN:  PATHOLOGY ? ?COUNTS:  YES ? ?TOURNIQUET:  * No tourniquets in log * ? ?DICTATION: .Other Dictation: Dictation Number dictated ? ?PLAN OF CARE: Admit to inpatient  ? ?PATIENT DISPOSITION:  PACU - hemodynamically stable. ?  ?Delay start of Pharmacological VTE agent (>24hrs) due to surgical blood loss or risk of bleeding: not applicable ? ?

## 2021-05-13 NOTE — Anesthesia Procedure Notes (Signed)
Procedure Name: Intubation ?Date/Time: 05/13/2021 7:30 AM ?Performed by: Michele Rockers, CRNA ?Pre-anesthesia Checklist: Patient identified, Patient being monitored, Timeout performed, Emergency Drugs available and Suction available ?Patient Re-evaluated:Patient Re-evaluated prior to induction ?Oxygen Delivery Method: Circle system utilized ?Preoxygenation: Pre-oxygenation with 100% oxygen ?Induction Type: IV induction ?Ventilation: Mask ventilation without difficulty ?Laryngoscope Size: Sabra Heck and 2 ?Grade View: Grade I ?Tube type: Oral ?Tube size: 7.0 mm ?Number of attempts: 1 ?Airway Equipment and Method: Stylet ?Placement Confirmation: ETT inserted through vocal cords under direct vision, positive ETCO2 and breath sounds checked- equal and bilateral ?Secured at: 21 cm ?Tube secured with: Tape ?Dental Injury: Teeth and Oropharynx as per pre-operative assessment  ? ? ? ? ?

## 2021-05-13 NOTE — Anesthesia Postprocedure Evaluation (Signed)
Anesthesia Post Note ? ?Patient: Christine Butler ? ?Procedure(s) Performed: TOTAL ABDOMINAL HYSTERECTOMY, BILATERAL SALPINGO OPPHORECTOMY (Bilateral: Uterus) ? ?  ? ?Patient location during evaluation: PACU ?Anesthesia Type: General ?Level of consciousness: awake and alert ?Pain management: pain level controlled ?Vital Signs Assessment: post-procedure vital signs reviewed and stable ?Respiratory status: spontaneous breathing, nonlabored ventilation, respiratory function stable and patient connected to nasal cannula oxygen ?Cardiovascular status: blood pressure returned to baseline and stable ?Postop Assessment: no apparent nausea or vomiting ?Anesthetic complications: no ? ? ?No notable events documented. ? ?Last Vitals:  ?Vitals:  ? 05/13/21 1109 05/13/21 1215  ?BP: 133/73   ?Pulse: 73   ?Resp: 14   ?Temp:    ?SpO2: 99% 99%  ?  ?Last Pain:  ?Vitals:  ? 05/13/21 1000  ?TempSrc: Oral  ?PainSc:   ? ? ?  ?  ?  ?  ?  ?  ? ?March Rummage Jalan Fariss ? ? ? ? ?

## 2021-05-13 NOTE — Progress Notes (Signed)
POD # 0  ?Patient doing well except pain control.  ? ?BP 133/73 (BP Location: Right Arm)   Pulse 73   Temp (!) 97.5 ?F (36.4 ?C) (Oral)   Resp 14   Ht '5\' 1"'$  (1.549 m)   Wt 77.1 kg   LMP 05/10/2018 (Approximate)   SpO2 99%   BMI 32.12 kg/m?  ?Urine out put clear and good ? ?Abdomen is soft and non tender ?Scant drainage left corner of dressing ? ?POD # 0  ?Doing well ?Surgical findings reviewed ?Discontinue Foley ?Adjust dilaudid dosage ?

## 2021-05-14 ENCOUNTER — Other Ambulatory Visit (HOSPITAL_COMMUNITY): Payer: Self-pay

## 2021-05-14 ENCOUNTER — Encounter (HOSPITAL_COMMUNITY): Payer: Self-pay | Admitting: Obstetrics and Gynecology

## 2021-05-14 LAB — CBC
HCT: 26.1 % — ABNORMAL LOW (ref 36.0–46.0)
Hemoglobin: 9.1 g/dL — ABNORMAL LOW (ref 12.0–15.0)
MCH: 32 pg (ref 26.0–34.0)
MCHC: 34.9 g/dL (ref 30.0–36.0)
MCV: 91.9 fL (ref 80.0–100.0)
Platelets: 124 10*3/uL — ABNORMAL LOW (ref 150–400)
RBC: 2.84 MIL/uL — ABNORMAL LOW (ref 3.87–5.11)
RDW: 12.8 % (ref 11.5–15.5)
WBC: 7.9 10*3/uL (ref 4.0–10.5)
nRBC: 0 % (ref 0.0–0.2)

## 2021-05-14 LAB — BASIC METABOLIC PANEL
Anion gap: 8 (ref 5–15)
BUN: 32 mg/dL — ABNORMAL HIGH (ref 6–20)
CO2: 19 mmol/L — ABNORMAL LOW (ref 22–32)
Calcium: 8.7 mg/dL — ABNORMAL LOW (ref 8.9–10.3)
Chloride: 109 mmol/L (ref 98–111)
Creatinine, Ser: 1.35 mg/dL — ABNORMAL HIGH (ref 0.44–1.00)
GFR, Estimated: 48 mL/min — ABNORMAL LOW (ref >60)
Glucose, Bld: 130 mg/dL — ABNORMAL HIGH (ref 70–99)
Potassium: 5.3 mmol/L — ABNORMAL HIGH (ref 3.5–5.1)
Sodium: 136 mmol/L (ref 135–145)

## 2021-05-14 LAB — SURGICAL PATHOLOGY

## 2021-05-14 MED ORDER — PROMETHAZINE HCL 25 MG PO TABS
50.0000 mg | ORAL_TABLET | Freq: Four times a day (QID) | ORAL | 0 refills | Status: DC | PRN
  Filled 2021-05-14: qty 60, 8d supply, fill #0

## 2021-05-14 MED ORDER — OXYCODONE HCL 5 MG PO TABS
5.0000 mg | ORAL_TABLET | ORAL | 0 refills | Status: DC | PRN
  Filled 2021-05-14: qty 30, 3d supply, fill #0

## 2021-05-14 NOTE — Consult Note (Addendum)
? ?  [facility] CM Inpatient Consult ? ? ?05/14/2021 ? ?[place] ?1970/05/26 ?[id number] ? ?Follow up:  Call attempt ? ?Noted that patient is for transition from the hospital today. Call attempt to patient's room via hospital operator without success to notify patient of planned out reach telephonic support from  [addendum] TOC  calls at [facility] for post hospital follow up support for member. Call to see if additional follow up is needed and was able to contact patient via cell phone with 2 identifiers.  Explained [facility] follow up and she confirms her PCP's TOC call would be all she needs, at this time. Encouraged to check MyChart, if needs changes. ? ?For questions, please contact: ? ?Natividad Brood, RN BSN CCM ?[place] [facility] Liaison ? [phone] business mobile phone ?Toll free office [phone]  ?Fax number: [phone] ?[email or web address] ? ? ?

## 2021-05-14 NOTE — Discharge Summary (Signed)
Admission Diagnosis: ?Carcinoma in Situ of Cervix ?Right adnexal mass ? ?Discharge Diagnosis: ?Same ? ?Hospital Course: ?51 year old female underwent uncomplicated TAH/BSO for carcinoma In Situ of Cervix. At time of surgery, possible right tubal torsion noted and would explain the right adnexal mass seen on preoperative ultrasound. ? ?Berdie did very well post op - Her Foley was removed about 5 hours after surgery. By the morning of POD # 1 she was ambulating, voiding, and tolerating regular diet. Pain minimal on only Oxycodone. She never received NSAIDs. ? ?She was discharged home on POD # 1 in excellent condition. ?Follow up in 1 week to remove steri strips ?Discharge instructions given to patient. ? ?BP 118/69 (BP Location: Right Arm)   Pulse 82   Temp 98.3 ?F (36.8 ?C) (Oral)   Resp 16   Ht '5\' 1"'$  (1.549 m)   Wt 77.1 kg   LMP 05/10/2018 (Approximate)   SpO2 97%   BMI 32.12 kg/m?  ?Results for orders placed or performed during the hospital encounter of 05/13/21 (from the past 24 hour(s))  ?CBC     Status: Abnormal  ? Collection Time: 05/14/21  4:17 AM  ?Result Value Ref Range  ? WBC 7.9 4.0 - 10.5 K/uL  ? RBC 2.84 (L) 3.87 - 5.11 MIL/uL  ? Hemoglobin 9.1 (L) 12.0 - 15.0 g/dL  ? HCT 26.1 (L) 36.0 - 46.0 %  ? MCV 91.9 80.0 - 100.0 fL  ? MCH 32.0 26.0 - 34.0 pg  ? MCHC 34.9 30.0 - 36.0 g/dL  ? RDW 12.8 11.5 - 15.5 %  ? Platelets 124 (L) 150 - 400 K/uL  ? nRBC 0.0 0.0 - 0.2 %  ?Basic metabolic panel     Status: Abnormal  ? Collection Time: 05/14/21  4:17 AM  ?Result Value Ref Range  ? Sodium 136 135 - 145 mmol/L  ? Potassium 5.3 (H) 3.5 - 5.1 mmol/L  ? Chloride 109 98 - 111 mmol/L  ? CO2 19 (L) 22 - 32 mmol/L  ? Glucose, Bld 130 (H) 70 - 99 mg/dL  ? BUN 32 (H) 6 - 20 mg/dL  ? Creatinine, Ser 1.35 (H) 0.44 - 1.00 mg/dL  ? Calcium 8.7 (L) 8.9 - 10.3 mg/dL  ? GFR, Estimated 48 (L) >60 mL/min  ? Anion gap 8 5 - 15  ? ?Abdomen is soft and non tender ?Bandage is clean and dry ?No CVA T  ?Negative Homan's sign ?

## 2021-05-16 NOTE — Discharge Summary (Signed)
See previous note ?Date of discharge May 14, 2021 ?

## 2021-05-17 NOTE — Op Note (Signed)
NAME: Christine Butler, Christine L. ?MEDICAL RECORD NO: [id number] ?ACCOUNT NO: [id number] ?DATE OF BIRTH: [birth year] ?FACILITY: [facility] ?LOCATION: [facility]-1SC ?PHYSICIAN: Tamesha Ellerbrock L. Helane Rima, MD ? ?Operative Report  ? ?DATE OF PROCEDURE: 05/13/2021 ? 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 ? ?POSTOPERATIVE DIAGNOSIS:  Carcinoma in situ of the cervix and possible torsion of the right fallopian tube. ? ?PROCEDURE: Total abdominal hysterectomy and bilateral salpingo-oophorectomy.  ?SURGEON:  Jahleel Stroschein L. Helane Rima, MD. ? ?ASSISTANT:  Dr. Linda Hedges  ? ?ANESTHESIA:  General. ? ?ESTIMATED BLOOD LOSS:  Per record.  ? ?PATHOLOGY: Cervix, uterus, tubes, and ovaries.  ? ?COMPLICATIONS:  None. ? ?DRAINS:  Foley catheter. ? ?DESCRIPTION OF PROCEDURE:  The patient was taken to the operating room.  She was intubated.  She was prepped and draped in the usual sterile fashion.  A low transverse incision was made after a Foley was inserted.  It was carried down to the fascia.   ?Fascia was scored in the midline and extended laterally.  The rectus muscles were separated in the midline.  The peritoneum was entered bluntly.  The peritoneal incision was then stretched.  The uterus was identified and the self-retaining retractor was  ?placed in the abdominal cavity.  We then used Kelly clamps to elevate the uterus and then noticed that there was a right fallopian tube mass there that almost looked like torsion because the tube was twisted in the middle portion and so we decided to  ?proceed with a hysterectomy and then after that, then we will proceed with a bilateral salpingo-oophorectomy. So we placed Kelly clamps across the triple pedicle.  Each pedicle was clamped, cut and suture ligated using 0 Vicryl suture.  We then walked  ?our way down, the broad ligament.  Each pedicle was very close to the uterus, each was clamped, cut and suture ligated using 0 Vicryl suture.  We then developed the bladder flap  sharply and then digitally.  The bladder blade was then readjusted.  And  ?straight Heaney clamps were then placed alongside the uterosacral cardinal ligament complexes.  Each pedicle was clamped, cut and suture ligated using 0 Vicryl suture.  We then reached the level of the external os. The specimen was removed and identified ? the cervix and uterus and sent to pathology.  The angle sutures were placed using 0 Vicryl suture and the remainder of the cuff was closed completely using 0 Vicryl in a series of interrupted. ? ?We then turned our attention back to the adnexa.  First, the left side where we elevated the tube and ovary with a Babcock clamp, placed a Heaney clamp just across the ovary and just beneath to clamp across the infundibulopelvic ligament to avoid injury  ?to the ureter.  Each pedicle was clamped, cut and suture ligated using 0 Vicryl suture and then a second free tie of 0 Vicryl suture was used to secure the pedicle.  This was performed on the left and then on the right and the specimens were sent  ?separately.  Irrigation was performed.  Hemostasis was noted.  All instruments and laps were removed from the abdominal cavity.  The peritoneum was closed using 0 Vicryl in a running stitch.  The fascia was closed using 0 Vicryl.  The subcutaneous was  ?very thin, so no need for subcu. The skin was closed with subcuticular 3-0 Vicryl on a Keith needle.  Benzoin, Steri-Strips and a honeycomb dressing were applied.  All sponge, lap  and instrument counts were correct x 2.  The patient went to recovery room ? in stable condition. ? ? ?[facility] ?D: 05/16/2021 3:32:53 pm T: 05/16/2021 11:56:00 pm  ?JOB: [id number]/ [id number]  ?

## 2021-05-20 ENCOUNTER — Other Ambulatory Visit (HOSPITAL_COMMUNITY): Payer: Self-pay

## 2021-05-20 MED ORDER — CYCLOBENZAPRINE HCL 10 MG PO TABS
ORAL_TABLET | ORAL | 3 refills | Status: DC
  Filled 2021-05-20: qty 30, 10d supply, fill #0
  Filled 2021-10-24: qty 30, 10d supply, fill #1

## 2021-05-30 ENCOUNTER — Other Ambulatory Visit (HOSPITAL_COMMUNITY): Payer: Self-pay

## 2021-06-03 ENCOUNTER — Other Ambulatory Visit (HOSPITAL_COMMUNITY): Payer: Self-pay

## 2021-06-11 ENCOUNTER — Other Ambulatory Visit (HOSPITAL_COMMUNITY): Payer: Self-pay

## 2021-06-11 DIAGNOSIS — L719 Rosacea, unspecified: Secondary | ICD-10-CM | POA: Diagnosis not present

## 2021-06-11 MED ORDER — METRONIDAZOLE 0.75 % EX CREA
TOPICAL_CREAM | CUTANEOUS | 2 refills | Status: DC
  Filled 2021-06-11 (×2): qty 45, 30d supply, fill #0
  Filled 2021-10-24: qty 45, 30d supply, fill #1
  Filled 2022-01-20: qty 45, 30d supply, fill #2

## 2021-06-13 ENCOUNTER — Other Ambulatory Visit (HOSPITAL_COMMUNITY): Payer: Self-pay

## 2021-06-18 DIAGNOSIS — Z79899 Other long term (current) drug therapy: Secondary | ICD-10-CM | POA: Diagnosis not present

## 2021-06-18 DIAGNOSIS — Z944 Liver transplant status: Secondary | ICD-10-CM | POA: Diagnosis not present

## 2021-06-20 ENCOUNTER — Ambulatory Visit (INDEPENDENT_AMBULATORY_CARE_PROVIDER_SITE_OTHER): Payer: 59 | Admitting: Endocrinology

## 2021-06-20 ENCOUNTER — Encounter: Payer: Self-pay | Admitting: Endocrinology

## 2021-06-20 ENCOUNTER — Other Ambulatory Visit (HOSPITAL_COMMUNITY): Payer: Self-pay

## 2021-06-20 VITALS — BP 122/84 | HR 78 | Ht 61.5 in | Wt 167.4 lb

## 2021-06-20 DIAGNOSIS — Z9889 Other specified postprocedural states: Principal | ICD-10-CM

## 2021-06-20 DIAGNOSIS — K862 Cyst of pancreas: Principal | ICD-10-CM

## 2021-06-20 DIAGNOSIS — Z944 Liver transplant status: Principal | ICD-10-CM

## 2021-06-20 DIAGNOSIS — M818 Other osteoporosis without current pathological fracture: Secondary | ICD-10-CM

## 2021-06-20 DIAGNOSIS — E559 Vitamin D deficiency, unspecified: Secondary | ICD-10-CM | POA: Diagnosis not present

## 2021-06-20 MED ORDER — ESTRADIOL 0.05 MG/24HR TD PTTW
MEDICATED_PATCH | TRANSDERMAL | 3 refills | Status: DC
  Filled 2021-06-20: qty 24, 84d supply, fill #0
  Filled 2021-10-19: qty 24, 84d supply, fill #1
  Filled 2022-01-20: qty 24, 84d supply, fill #2
  Filled 2022-04-11 (×2): qty 24, 84d supply, fill #3

## 2021-06-20 NOTE — Progress Notes (Signed)
Patient ID: Christine Butler, female   DOB: 1970/06/27, 51 y.o.   MRN: [id number] ? ?       ? ? ? ? ?Chief complaint: Follow-up of osteoporosis ? ?History of Present Illness: ? ? ?She had a screening bone density done in 02/2018 because of her chronic liver disease and since she is a candidate for liver transplant ?Results of the bone density were as follows: ? ?Spine:  The  Z score is -2.4 and the T score is -3.0.  This value is below the fracture risk threshold. ? ?The total bone mineral density in the proximal left femur measures 0.667 gm/cm2.  The Z score is -1.9 and the T score is -2.3.  This value is below the fracture risk threshold.   ?The femoral neck density is 0.532 gm/cm2, and the T score is -2.9.  The other T scores range from -2.4 to -1.6.  ? ?Most recent BONE DENSITY in 09/2020 done elsewhere shows the following ?Lumbar spine not evaluated ?Total hip T score = -2.8 ?Left femoral neck T score = -3.4, no significant change ? ?Previous height 5 feet 1-1/2 inches, today's height is the same ? ?She has no history of low trauma fracture ?Mild L5 compression deformity noticed on MRI in 02/2018, unchanged from prior, asymptomatic ? ?Early menopause: She has had mild hot flashes ? ?Has had a hysterectomy and gynecologist has recommended HRT ? ?Lab Results  ?Component Value Date  ? Agar <0.3 10/11/2019  ? ? ?RECLAST infusion history: 12/13/18, 12/04/2020 ? ?Currently she is off prednisone ? ?Calcium supplements: using Citracal 1200 mg with 1000 U D3 ?Also has D3 1000 U in her multivitamin ?Previously had taken prescription vitamin D for about a month ? ?Baseline vitamin D level was about 21 ? ?Last level 37.5 on 10/15/20 ?  ? ?LABS: ? ?Last calcium level 8.7 with albumin 3.5 on 02/08/19 ? ?Baseline vitamin D level done at [facility]: On 11/29/2026 was 21.5 ? ?Lab Results  ?Component Value Date  ? CREATININE 1.35 (H) 05/14/2021  ? CREATININE 1.21 (H) 05/07/2021  ? CREATININE 1.15 (H) 04/12/2021  ? ? ? ?Lab Results  ?Component  Value Date  ? VD25OH 30.82 06/20/2021  ? ? ? ?Past Medical History:  ?Diagnosis Date  ? (HFpEF) heart failure with preserved ejection fraction ([place]) 08/09/2019  ? Acute blood loss anemia 04/20/2017  ? Altered mental status 08/14/2019  ? Arthritis   ? right knee  ? Ascites--mild this admit 11/06/2017  ? Atypical chest pain 11/05/2017  ? Atypical squamous cells of undetermined significance (ASCUS) on Papanicolaou smear of cervix 08/05/2018  ? CAD (coronary artery disease)   ? a. s/p CABGx2 in 02/2017 (LAD not suitable for PCI), EF normal.  ? CAD (coronary artery disease), native coronary artery 03/23/2017  ? 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 Chronic low back pain   ? Cirrhosis ([place]) 03/16/2017  ? Coronary artery disease 03/24/2017  ? COVID-19 09/18/2020  ? Current episode of major depressive disorder without prior episode 03/13/2020  ? Dyslipidemia, goal LDL below 70 10/13/2017  ? Elevated LFTs 04/20/2017  ? Encephalopathy 09/29/2019  ? hepatic encephalopathy  ? Familial hyperlipidemia   ? GERD (gastroesophageal reflux disease)   ? GI bleed 02/01/2018  ? H/O LEEP 03/30/2019  ? H/O two vessel coronary artery bypass graft 03/30/2019  ? High grade squamous intraepithelial lesion (HGSIL) on cytologic smear of cervix 05/04/2019  ? History of gastrostomy, has currently [facility]) 07/12/2020  ? Formatting  of this note might be different from the original. Patient underwent ERCP 07/11/20. Procedure c/b hepaticogastrostomy stent placement.  ? Hypertension   ? Hypo-osmolality and hyponatremia 03/30/2019  ? IBS (irritable bowel syndrome)   ? Other insomnia 09/05/2019  ? Pancytopenia ([place]) 09/28/2019  ? PONV (postoperative nausea and vomiting)   ? i always throw up on waking up , but last EGD in march had no issues    ? PONV (postoperative nausea and vomiting)   ? likes zofran and steroid to help does not like scopolamine patch  ? Port-A-Cath in place 11/21/2019  ? 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 Formatting of this note might  be different from the original. IMOUPDATE  ? Primary biliary cirrhosis ([place])   ? cirhosis/liver disease followed by transplant team led by Dr Manuella Ghazi at [facility]   ? S/P CABG (coronary artery bypass graft)   ? S/P CABG x 2 03/24/2017  ? LIMA to DIAGONAL Portion of SVG/LEFT RADIAL to LAD  ? S/P LEEP (loop electrosurgical excision procedure) 05/11/2019  ? Status post liver transplantation ([place]) 03/30/2019  ? SVD (spontaneous vaginal delivery)   ? x 3  ? Upper GI bleed 02/01/2018  ? Urinary incontinence   ? occ  ? Urinary tract bacterial infections last oct 2021  ? Wears glasses   ? ? ?Past Surgical History:  ?Procedure Laterality Date  ? CERVICAL CONIZATION W/BX N/A 03/22/2020  ? Procedure: CONIZATION CERVIX WITH BIOPSY;  Surgeon: Dian Queen, MD;  Location: [facility];  Service: Gynecology;  Laterality: N/A;  ? CHOLECYSTECTOMY  09/2019  ? COLPOSCOPY N/A 03/22/2020  ? Procedure: COLPOSCOPY;  Surgeon: Dian Queen, MD;  Location: [facility];  Service: Gynecology;  Laterality: N/A;  ? CORONARY ARTERY BYPASS GRAFT N/A 03/24/2017  ? Procedure: CORONARY ARTERY BYPASS GRAFTING (CABG) x two, using left internal mammary artery, left radial artery, and right leg greater saphenous vein harvested endoscopically;  Surgeon: Ivin Poot, MD;  Location: [place];  Service: Open Heart Surgery;  Laterality: N/A;  ? ENDOVEIN HARVEST OF GREATER SAPHENOUS VEIN Right 03/24/2017  ? Procedure: ENDOVEIN HARVEST OF GREATER SAPHENOUS VEIN;  Surgeon: Ivin Poot, MD;  Location: [place];  Service: Open Heart Surgery;  Laterality: Right;  ? ESOPHAGEAL BANDING  02/01/2018  ? Procedure: ESOPHAGEAL BANDING;  Surgeon: [name], MD;  Location: [facility];  Service: Gastroenterology;;  ? ESOPHAGEAL BANDING N/A 03/29/2018  ? Procedure: ESOPHAGEAL BANDING;  Surgeon: [name], MD;  Location: [facility];  Service: Gastroenterology;  Laterality: N/A;  ? ESOPHAGEAL BANDING N/A 05/21/2018  ? Procedure: ESOPHAGEAL  BANDING;  Surgeon: [name], MD;  Location: [facility];  Service: Gastroenterology;  Laterality: N/A;  ? ESOPHAGEAL BANDING N/A 10/11/2018  ? Procedure: ESOPHAGEAL BANDING;  Surgeon: [name], MD;  Location: [facility];  Service: Gastroenterology;  Laterality: N/A;  ? ESOPHAGOGASTRODUODENOSCOPY N/A 03/29/2018  ? Procedure: ESOPHAGOGASTRODUODENOSCOPY (EGD);  Surgeon: [name], MD;  Location: [facility];  Service: Gastroenterology;  Laterality: N/A;  ? ESOPHAGOGASTRODUODENOSCOPY N/A 05/21/2018  ? Procedure: ESOPHAGOGASTRODUODENOSCOPY (EGD);  Surgeon: [name], MD;  Location: [facility];  Service: Gastroenterology;  Laterality: N/A;  ? ESOPHAGOGASTRODUODENOSCOPY (EGD) WITH PROPOFOL N/A 02/01/2018  ? Procedure: ESOPHAGOGASTRODUODENOSCOPY (EGD) WITH PROPOFOL;  Surgeon: [name], MD;  Location: [facility];  Service: Gastroenterology;  Laterality: N/A;  ? ESOPHAGOGASTRODUODENOSCOPY (EGD) WITH PROPOFOL N/A 10/11/2018  ? Procedure: ESOPHAGOGASTRODUODENOSCOPY (EGD) WITH PROPOFOL;  Surgeon: [name], MD;  Location: [facility];  Service: Gastroenterology;  Laterality: N/A;  ?  HYSTERECTOMY ABDOMINAL WITH SALPINGO-OOPHORECTOMY Bilateral 05/13/2021  ? Procedure: TOTAL ABDOMINAL HYSTERECTOMY, BILATERAL SALPINGO OPPHORECTOMY;  Surgeon: Dian Queen, MD;  Location: [place];  Service: Gynecology;  Laterality: Bilateral;  ? INCONTINENCE SURGERY  2009  ? urinary  bladder sling  ? IR IMAGING GUIDED PORT INSERTION  04/08/2019  ? LEEP N/A 03/28/2014  ? Procedure: LOOP ELECTROSURGICAL EXCISION PROCEDURE (LEEP) cone biopsy;  Surgeon: Cyril Mourning, MD;  Location: [facility];  Service: Gynecology;  Laterality: N/A;  ? LEEP N/A 03/22/2020  ? Procedure: LOOP ELECTROSURGICAL EXCISION PROCEDURE (LEEP);  Surgeon: Dian Queen, MD;  Location: [facility];  Service: Gynecology;  Laterality: N/A;  ? LEFT HEART CATH AND CORONARY ANGIOGRAPHY N/A 03/23/2017  ? Procedure: LEFT HEART CATH AND CORONARY ANGIOGRAPHY;   Surgeon: Jettie Booze, MD;  Location: [place] [facility];  Service: Cardiovascular;  Laterality: N/A;  ? LIVER BIOPSY    ? x 2  ? LIVER TRANSPLANT  08/ 24/2021  ? RADIAL ARTERY HARVEST Left 1/29/201

## 2021-06-21 LAB — VITAMIN D 25 HYDROXY (VIT D DEFICIENCY, FRACTURES): VITD: 30.82 ng/mL (ref 30.00–100.00)

## 2021-07-02 DIAGNOSIS — N3941 Urge incontinence: Secondary | ICD-10-CM | POA: Diagnosis not present

## 2021-07-02 DIAGNOSIS — R159 Full incontinence of feces: Secondary | ICD-10-CM | POA: Diagnosis not present

## 2021-07-08 ENCOUNTER — Other Ambulatory Visit (HOSPITAL_COMMUNITY): Payer: Self-pay

## 2021-07-09 DIAGNOSIS — N3941 Urge incontinence: Secondary | ICD-10-CM | POA: Diagnosis not present

## 2021-07-09 DIAGNOSIS — R159 Full incontinence of feces: Secondary | ICD-10-CM | POA: Diagnosis not present

## 2021-07-11 ENCOUNTER — Other Ambulatory Visit (HOSPITAL_COMMUNITY): Payer: Self-pay

## 2021-07-11 DIAGNOSIS — L719 Rosacea, unspecified: Secondary | ICD-10-CM | POA: Diagnosis not present

## 2021-07-11 MED ORDER — METRONIDAZOLE 0.75 % EX CREA
TOPICAL_CREAM | CUTANEOUS | 2 refills | Status: DC
  Filled 2021-07-11: qty 45, 30d supply, fill #0

## 2021-07-15 ENCOUNTER — Other Ambulatory Visit (HOSPITAL_COMMUNITY): Payer: Self-pay

## 2021-07-16 DIAGNOSIS — N3941 Urge incontinence: Secondary | ICD-10-CM | POA: Diagnosis not present

## 2021-07-16 DIAGNOSIS — R159 Full incontinence of feces: Secondary | ICD-10-CM | POA: Diagnosis not present

## 2021-07-19 DIAGNOSIS — Z79899 Other long term (current) drug therapy: Principal | ICD-10-CM

## 2021-07-19 DIAGNOSIS — Z944 Liver transplant status: Principal | ICD-10-CM

## 2021-07-23 DIAGNOSIS — N3941 Urge incontinence: Secondary | ICD-10-CM | POA: Diagnosis not present

## 2021-07-23 DIAGNOSIS — R159 Full incontinence of feces: Secondary | ICD-10-CM | POA: Diagnosis not present

## 2021-07-23 DIAGNOSIS — R152 Fecal urgency: Secondary | ICD-10-CM | POA: Diagnosis not present

## 2021-07-24 ENCOUNTER — Encounter: Payer: Self-pay | Admitting: Internal Medicine

## 2021-07-24 ENCOUNTER — Other Ambulatory Visit (HOSPITAL_COMMUNITY): Payer: Self-pay

## 2021-07-24 DIAGNOSIS — E785 Hyperlipidemia, unspecified: Secondary | ICD-10-CM

## 2021-07-26 ENCOUNTER — Other Ambulatory Visit (HOSPITAL_COMMUNITY): Payer: Self-pay

## 2021-07-30 DIAGNOSIS — N3941 Urge incontinence: Secondary | ICD-10-CM | POA: Diagnosis not present

## 2021-07-30 DIAGNOSIS — K5900 Constipation, unspecified: Secondary | ICD-10-CM | POA: Diagnosis not present

## 2021-08-06 ENCOUNTER — Other Ambulatory Visit (HOSPITAL_COMMUNITY): Payer: Self-pay

## 2021-08-08 ENCOUNTER — Other Ambulatory Visit (HOSPITAL_COMMUNITY): Payer: Self-pay

## 2021-08-09 ENCOUNTER — Other Ambulatory Visit (HOSPITAL_COMMUNITY): Payer: Self-pay

## 2021-08-12 ENCOUNTER — Other Ambulatory Visit (HOSPITAL_COMMUNITY): Payer: Self-pay

## 2021-08-12 DIAGNOSIS — Z944 Liver transplant status: Principal | ICD-10-CM

## 2021-08-12 DIAGNOSIS — Z79899 Other long term (current) drug therapy: Principal | ICD-10-CM

## 2021-08-13 ENCOUNTER — Ambulatory Visit: Payer: 59 | Admitting: Cardiology

## 2021-08-13 DIAGNOSIS — N393 Stress incontinence (female) (male): Secondary | ICD-10-CM | POA: Diagnosis not present

## 2021-08-13 DIAGNOSIS — K5900 Constipation, unspecified: Secondary | ICD-10-CM | POA: Diagnosis not present

## 2021-08-19 IMAGING — US US ABDOMEN LIMITED
1 series · 14 of 25 positions shown · non-contrast
Comparison: January 26, 2019.

CLINICAL DATA: Right upper quadrant abdominal pain.

EXAM:
ULTRASOUND ABDOMEN LIMITED RIGHT UPPER QUADRANT

[Series 1: us abdomen limited · 0.23mm/px · 14 of 34 slices shown]
[im 1/34]
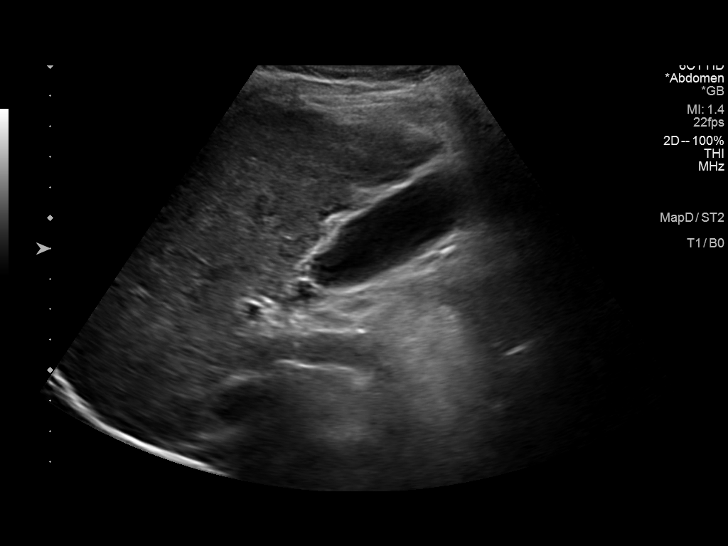
[im 3/34]
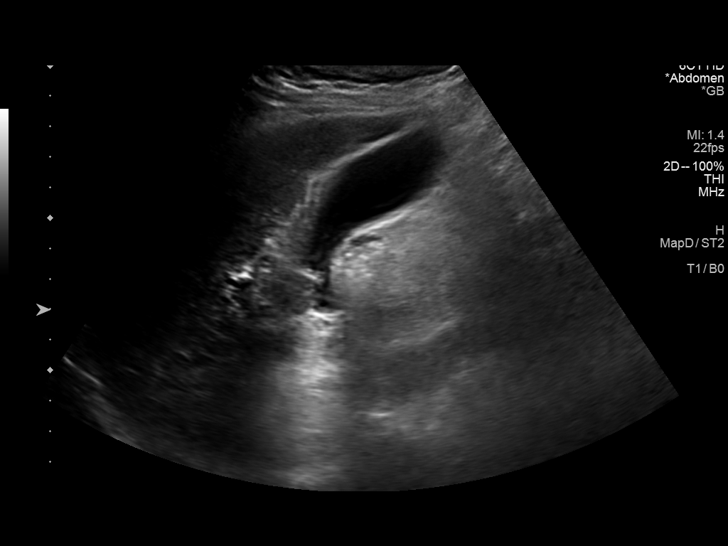
[im 6/34]
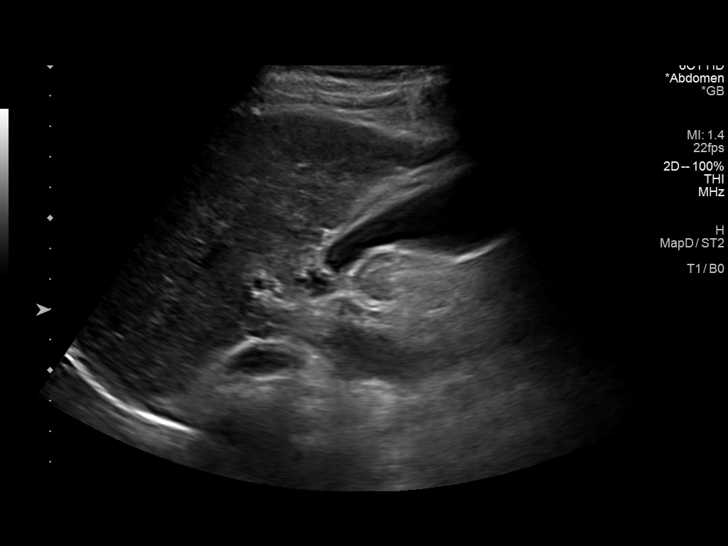
[im 9/34]
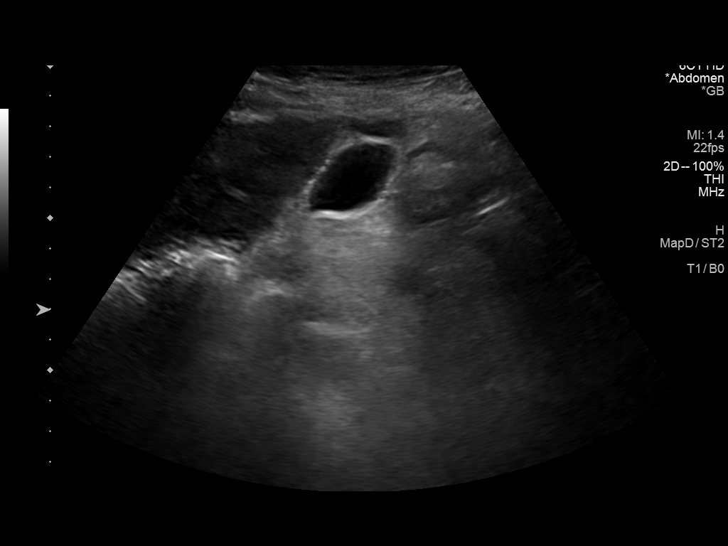
[im 12/34]
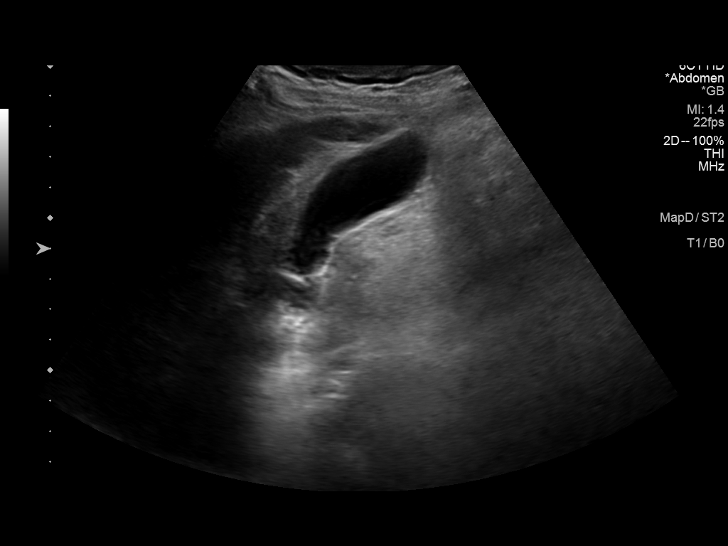
[im 13/34]
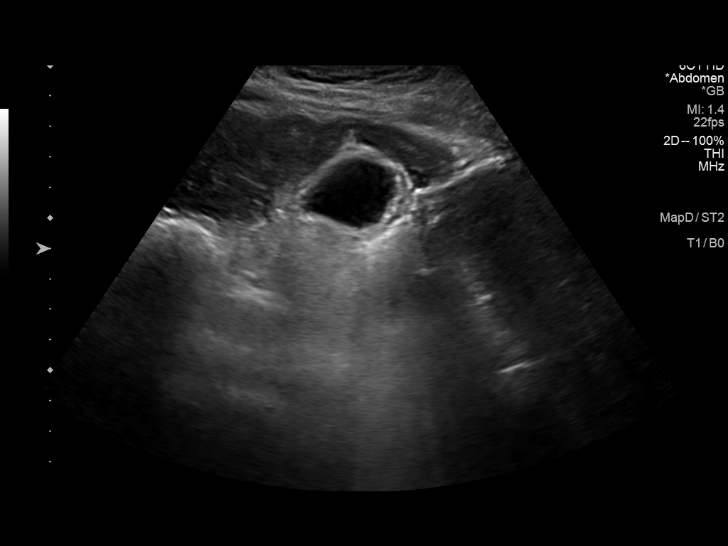
[im 16/34]
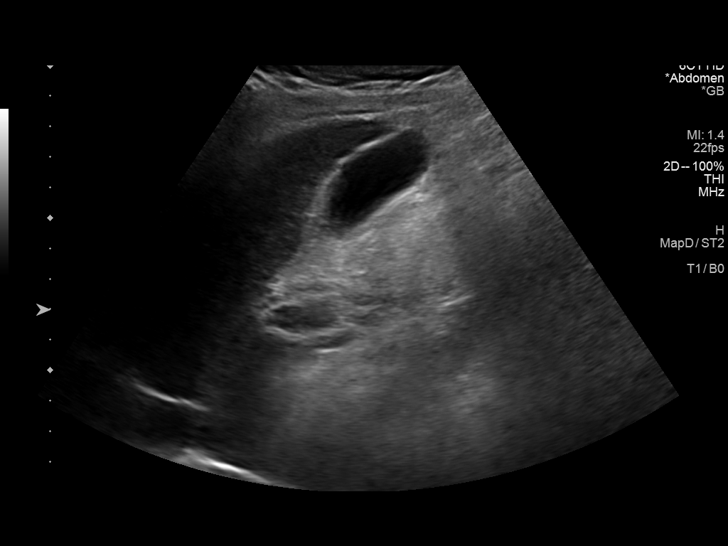
[im 18/34]
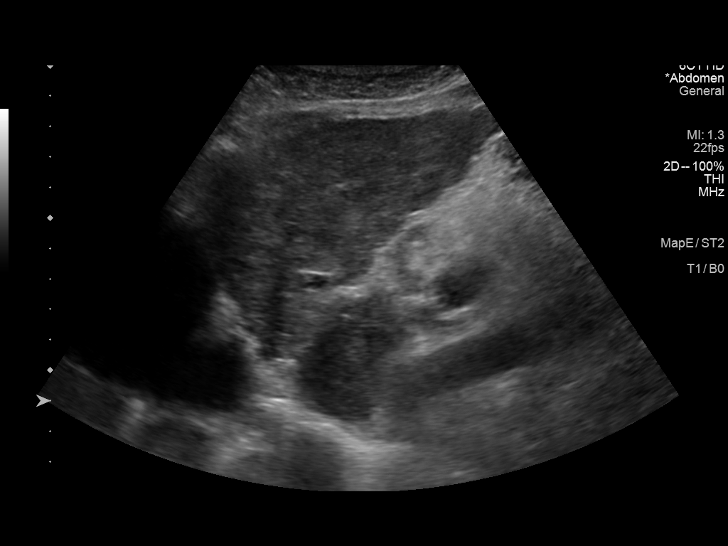
[im 21/34]
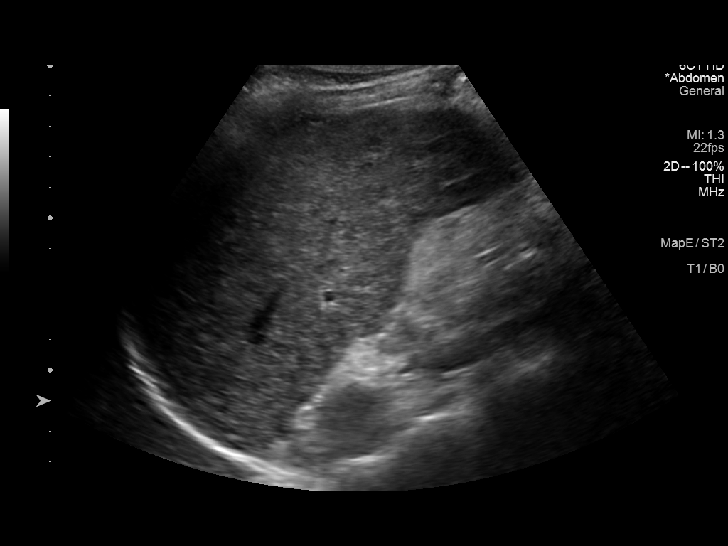
[im 23/34]
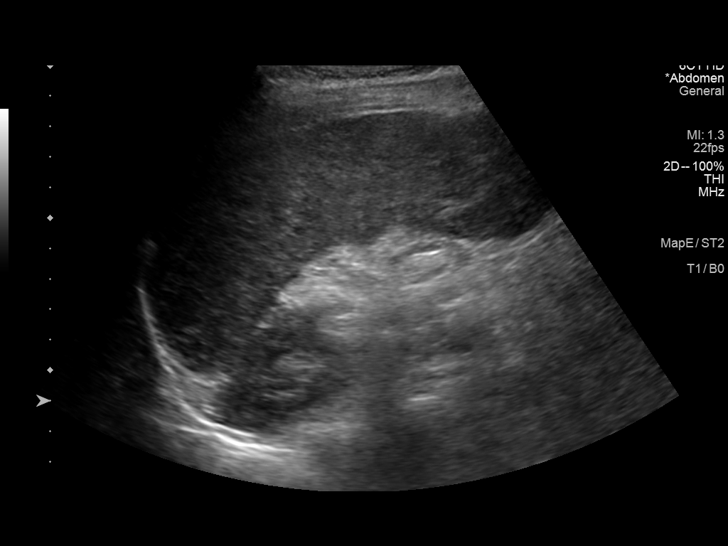
[im 25/34]
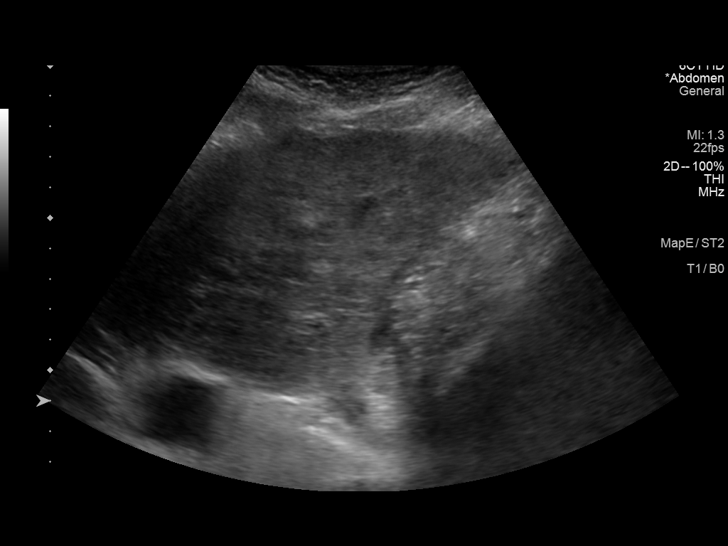
[im 28/34]
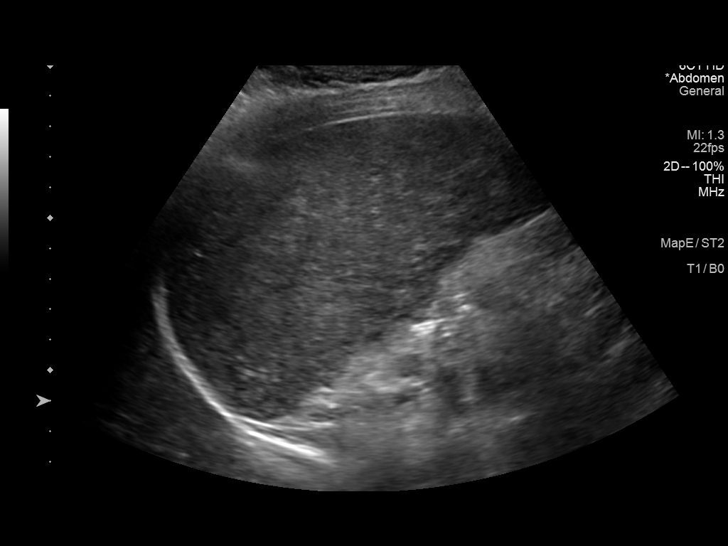
[im 31/34]
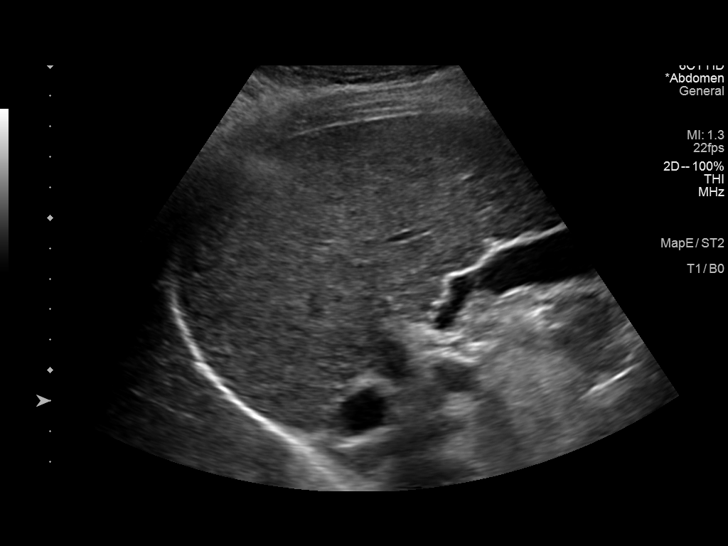
[im 34/34]
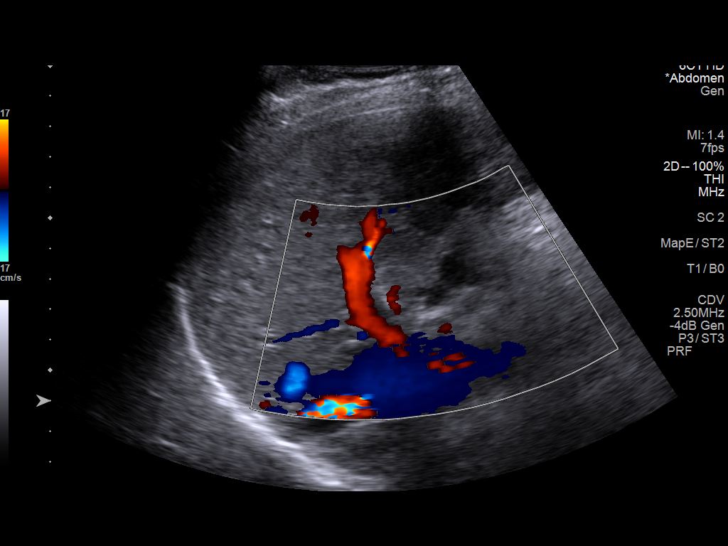

[14 of 25 positions shown; findings below may reference images not displayed]

FINDINGS: Gallbladder:

No cholelithiasis or significant gallbladder wall thickening is
noted. No sonographic Murphy's sign is noted.

Common bile duct:

Diameter: 5 mm which is within normal limits.

Liver:

No focal lesion identified. Heterogeneous echotexture of hepatic
parenchyma is noted. Nodular contours are noted suggesting hepatic
cirrhosis. Portal vein is patent on color Doppler imaging with
normal direction of blood flow towards the liver.

Other: Minimal ascites is noted.
IMPRESSION: Findings consistent with hepatic cirrhosis. No focal sonographic
hepatic abnormality is noted. Minimal ascites.

## 2021-08-20 DIAGNOSIS — N393 Stress incontinence (female) (male): Secondary | ICD-10-CM | POA: Diagnosis not present

## 2021-08-20 DIAGNOSIS — K5900 Constipation, unspecified: Secondary | ICD-10-CM | POA: Diagnosis not present

## 2021-09-04 ENCOUNTER — Other Ambulatory Visit (HOSPITAL_COMMUNITY): Payer: Self-pay

## 2021-09-04 ENCOUNTER — Emergency Department (HOSPITAL_COMMUNITY)
Admission: EM | Admit: 2021-09-04 | Discharge: 2021-09-04 | Discharge status: Against Medical Advice | Payer: 59 | Attending: Emergency Medicine | Admitting: Emergency Medicine

## 2021-09-04 ENCOUNTER — Emergency Department (HOSPITAL_COMMUNITY): Payer: 59

## 2021-09-04 ENCOUNTER — Other Ambulatory Visit: Payer: Self-pay

## 2021-09-04 ENCOUNTER — Telehealth: Payer: Self-pay | Admitting: Cardiology

## 2021-09-04 ENCOUNTER — Encounter (HOSPITAL_COMMUNITY): Payer: Self-pay

## 2021-09-04 DIAGNOSIS — R079 Chest pain, unspecified: Secondary | ICD-10-CM | POA: Diagnosis not present

## 2021-09-04 DIAGNOSIS — R0789 Other chest pain: Secondary | ICD-10-CM | POA: Diagnosis not present

## 2021-09-04 DIAGNOSIS — R42 Dizziness and giddiness: Secondary | ICD-10-CM | POA: Insufficient documentation

## 2021-09-04 DIAGNOSIS — R0602 Shortness of breath: Secondary | ICD-10-CM | POA: Diagnosis not present

## 2021-09-04 DIAGNOSIS — Z5321 Procedure and treatment not carried out due to patient leaving prior to being seen by health care provider: Secondary | ICD-10-CM | POA: Insufficient documentation

## 2021-09-04 DIAGNOSIS — K922 Gastrointestinal hemorrhage, unspecified: Secondary | ICD-10-CM | POA: Diagnosis not present

## 2021-09-04 LAB — CBC WITH DIFFERENTIAL/PLATELET
Abs Immature Granulocytes: 0.02 10*3/uL (ref 0.00–0.07)
Basophils Absolute: 0 10*3/uL (ref 0.0–0.1)
Basophils Relative: 0 %
Eosinophils Absolute: 0.4 10*3/uL (ref 0.0–0.5)
Eosinophils Relative: 7 %
HCT: 30.7 % — ABNORMAL LOW (ref 36.0–46.0)
Hemoglobin: 11.1 g/dL — ABNORMAL LOW (ref 12.0–15.0)
Immature Granulocytes: 0 %
Lymphocytes Relative: 26 %
Lymphs Abs: 1.6 10*3/uL (ref 0.7–4.0)
MCH: 33 pg (ref 26.0–34.0)
MCHC: 36.2 g/dL — ABNORMAL HIGH (ref 30.0–36.0)
MCV: 91.4 fL (ref 80.0–100.0)
Monocytes Absolute: 0.4 10*3/uL (ref 0.1–1.0)
Monocytes Relative: 7 %
Neutro Abs: 3.6 10*3/uL (ref 1.7–7.7)
Neutrophils Relative %: 60 %
Platelets: 157 10*3/uL (ref 150–400)
RBC: 3.36 MIL/uL — ABNORMAL LOW (ref 3.87–5.11)
RDW: 12 % (ref 11.5–15.5)
WBC: 6 10*3/uL (ref 4.0–10.5)
nRBC: 0 % (ref 0.0–0.2)

## 2021-09-04 LAB — COMPREHENSIVE METABOLIC PANEL
ALT: 12 U/L (ref 0–44)
AST: 18 U/L (ref 15–41)
Albumin: 3.9 g/dL (ref 3.5–5.0)
Alkaline Phosphatase: 80 U/L (ref 38–126)
Anion gap: 9 (ref 5–15)
BUN: 31 mg/dL — ABNORMAL HIGH (ref 6–20)
CO2: 19 mmol/L — ABNORMAL LOW (ref 22–32)
Calcium: 8.8 mg/dL — ABNORMAL LOW (ref 8.9–10.3)
Chloride: 110 mmol/L (ref 98–111)
Creatinine, Ser: 1.59 mg/dL — ABNORMAL HIGH (ref 0.44–1.00)
GFR, Estimated: 39 mL/min — ABNORMAL LOW (ref >60)
Glucose, Bld: 100 mg/dL — ABNORMAL HIGH (ref 70–99)
Potassium: 5.1 mmol/L (ref 3.5–5.1)
Sodium: 138 mmol/L (ref 135–145)
Total Bilirubin: 0.6 mg/dL (ref 0.3–1.2)
Total Protein: 7.1 g/dL (ref 6.5–8.1)

## 2021-09-04 LAB — TROPONIN I (HIGH SENSITIVITY): Troponin I (High Sensitivity): 3 ng/L (ref <18)

## 2021-09-04 LAB — BRAIN NATRIURETIC PEPTIDE: B Natriuretic Peptide: 188 pg/mL — ABNORMAL HIGH (ref 0.0–100.0)

## 2021-09-04 MED ORDER — ASPIRIN 81 MG PO CHEW: 324.0000 mg | CHEWABLE_TABLET | Freq: Once | ORAL | Status: DC

## 2021-09-04 NOTE — ED Triage Notes (Signed)
Has had 3 episodes of heaviness in chest this week and spoke with cards for evaluation.

## 2021-09-04 NOTE — ED Notes (Signed)
Pt left [facility] due to long wait times.  

## 2021-09-04 NOTE — Telephone Encounter (Signed)
Pt c/o of Chest Pain: STAT if CP now or developed within 24 hours  1. Are you having CP right now? Yes  2. Are you experiencing any other symptoms (ex. SOB, nausea, vomiting, sweating)? No   3. How long have you been experiencing CP? Started two days ago, 09/02/21  4. Is your CP continuous or coming and going? Continuous, has had 3 episodes since it began occurring   5. Have you taken Nitroglycerin? No  ?   Transferring to triage due to being STAT

## 2021-09-04 NOTE — Telephone Encounter (Signed)
Patient called stating that she had chest pressure that started on Monday in her upper left chest area below her clavicle. She has nitro but did not take any the pressure went away. She started experiencing the same chest pressure yesterday at work but she did not have her nitro and the chest pressure went away. Today she started feeling the same chest pressure and called into the office for advice on what to do. When she has the chest pressure she becomes SOB and it feels like heart is fluttering in her chest. She has a history of open heart surgery and she states that when she had surgery before the bifurcation of her LAD was 99 % blocked. Due to her history and symptoms I recommended that she go to the ER. Patient was agreeable with this plan stated that she would go to the ER.

## 2021-09-04 NOTE — ED Provider Triage Note (Signed)
Emergency Medicine Provider Triage Evaluation Note  Christine Butler , a 51 y.o. female  was evaluated in triage.  Pt complains of chest pain.  Patient reports that he had 3 episodes of chest pain last week.  Last episode occurred today at noon while patient was sitting down at lunch.  Pain was located throughout her upper chest and described as a heaviness.  Patient reports she did have some associated shortness of breath.  Denies any associated nausea, vomiting, or diaphoresis.  Review of Systems  Positive: Chest pain, shortness of breath, lightheadedness Negative: Syncope, palpitations, nausea, vomiting, diaphoresis, leg swelling or tenderness, hemoptysis  Physical Exam  BP (!) 153/97   Pulse 73   Temp 98.2 F (36.8 C) (Oral)   Resp 20   Ht '5\' 1"'$  (1.549 m)   Wt 75.8 kg   LMP 06/24/2017   SpO2 99%   BMI 31.55 kg/m  Gen:   Awake, no distress   Resp:  Normal effort, clear to auscultation bilaterally MSK:   Moves extremities without difficulty  Other:  +2 right radial pulse.  Left radial pulse absent status post CABG.  No swelling or tenderness to bilateral lower extremities  Medical Decision Making  Medically screening exam initiated at 12:16 PM.  Appropriate orders placed.  Christine Butler was informed that the remainder of the evaluation will be completed by another provider, this initial triage assessment does not replace that evaluation, and the importance of remaining in the ED until their evaluation is complete.  Due to extensive cardiac history we will give patient 324 mg of aspirin.  ACS work-up initiated.   Loni Beckwith, [place] 09/04/21 1219

## 2021-09-04 NOTE — Telephone Encounter (Signed)
I also spoke with pt at time of call. Pt advised to go o the ED with her history and she stated that the pain is similar to when she had her CABG and that everyone was shocked with her blockage before as she was not very symptotic at that time. 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 Pt has not tried NTG as she states she did not think about trying it. Advised to go to the ED now for workup. Pt verbalized understanding and had no additional questions.

## 2021-09-06 ENCOUNTER — Ambulatory Visit: Payer: 59 | Admitting: Cardiology

## 2021-09-06 ENCOUNTER — Other Ambulatory Visit (HOSPITAL_COMMUNITY): Payer: Self-pay

## 2021-09-09 ENCOUNTER — Other Ambulatory Visit (HOSPITAL_COMMUNITY): Payer: Self-pay

## 2021-09-09 ENCOUNTER — Telehealth: Payer: 59 | Admitting: Physician Assistant

## 2021-09-09 DIAGNOSIS — Z944 Liver transplant status: Principal | ICD-10-CM

## 2021-09-09 DIAGNOSIS — Z79899 Other long term (current) drug therapy: Principal | ICD-10-CM

## 2021-09-09 DIAGNOSIS — R3989 Other symptoms and signs involving the genitourinary system: Secondary | ICD-10-CM

## 2021-09-09 MED ORDER — CEPHALEXIN 500 MG PO CAPS
500.0000 mg | ORAL_CAPSULE | Freq: Two times a day (BID) | ORAL | 0 refills | Status: AC
  Filled 2021-09-09: qty 14, 7d supply, fill #0

## 2021-09-09 NOTE — Progress Notes (Signed)

## 2021-09-09 NOTE — Progress Notes (Signed)
I have spent 5 minutes in review of e-visit questionnaire, review and updating patient chart, medical decision making and response to patient.   Ebin Palazzi Cody Collier Bohnet, PA-C    

## 2021-09-10 NOTE — Progress Notes (Unsigned)
Cardiology Office Note:    Date:  09/11/2021   ID:  ATLAS KUC, DOB [birth year], MRN [id number]  PCP:  Scifres, Earlie Server, PA-C (Inactive)   [facility] Providers Cardiologist:  [name], MD     Referring MD: No ref. provider found   Chief Complaint: chest pain  History of Present Illness:    Christine Butler is a very pleasant 51 y.o. female with a hx of HFpEF, CAD s/p CABG x 2, heterogeneous familial hyperlipidemia with very high LDL, primary biliary cirrhosis s/p liver transplant, and hypertension.   Cardiac catheterization 03/23/2017 revealed high-grade 95% stenosis of the LAD diagonal bifurcation.  LV function was preserved. She was not felt to be a candidate for percutaneous intervention/stent due to coronary anatomy and small vessel size.  CABG was recommended. She underwent CABG x2 (LIMA-dominant diagonal, left radial artery free graft to LAD with interposition vein graft at the proximal anastomosis on 03/24/2017.  History of primary biliary cirrhosis s/p liver transplant 10/18/19  followed by [facility].   She was last seen in our office on 01/08/2021 by Dr. Geraldo Pitter at which time no changes were made to her treatment plan and she was advised to follow-up in 6 months  She called our office on 09/04/2021 with complaints of chest pain that started 2 days prior. Reports the pressure starts under her left clavicle, did not take nitroglycerin. Chest pain resolved on its own. CP accompanied by shortness of breath and feeling like her heart is fluttering. Advised to go to ED for evaluation. Had negative hs troponin x 1, then left ama.   Today, she is here for follow-up.  She sat in the ED for 5 hours on 7/12 and did not get repeat blood testing or aspirin, so she decided to leave. Reports chest pain for which she went to ED on 7/12 started 2 days prior. She had 3 different occurrences of chest discomfort in her upper chest near sternal notch that occurred over 2-3 days (one occurrence  while reclining in a chair and one occurred while eating lunch at work). She did not take any nitroglycerin. No n/v, diaphoresis. Felt like she had to think to take a deep breath. Was having some stress thinking about her kids and being an empty-nester. States angina that occurred prior to CABG was in the same area of her chest but worsened with exertion. No further recurrence of chest pain since 7/12.  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 Reports liver function is stable, due for follow-up at [facility] next month. No concerns for elevated BP recently. She denies lower extremity edema, fatigue, palpitations, melena, hematuria, hemoptysis, diaphoresis, weakness, presyncope, syncope, orthopnea, and PND.  Past Medical History:  Diagnosis Date   (HFpEF) heart failure with preserved ejection fraction ([place]) 08/09/2019   Acute blood loss anemia 04/20/2017   Altered mental status 08/14/2019   Arthritis    right knee   Ascites--mild this admit 11/06/2017   Atypical chest pain 11/05/2017   Atypical squamous cells of undetermined significance (ASCUS) on Papanicolaou smear of cervix 08/05/2018   CAD (coronary artery disease)    a. s/p CABGx2 in 02/2017 (LAD not suitable for PCI), EF normal.   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   Chronic low back pain    Cirrhosis ([place]) 03/16/2017   Coronary artery disease  03/24/2017   COVID-19 09/18/2020   Current episode of major depressive disorder without prior episode 03/13/2020   Dyslipidemia, goal LDL below 70 10/13/2017   Elevated LFTs 04/20/2017   Encephalopathy 09/29/2019   hepatic encephalopathy   Familial hyperlipidemia    GERD (gastroesophageal reflux disease)    GI bleed 02/01/2018   H/O LEEP 03/30/2019   H/O two vessel coronary artery bypass graft 03/30/2019   High grade squamous intraepithelial lesion  (HGSIL) on cytologic smear of cervix 05/04/2019   History of gastrostomy, has currently [facility]) 07/12/2020   Formatting of this note might be different from the original. Patient underwent ERCP 07/11/20. Procedure c/b hepaticogastrostomy stent placement.   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 IMOUPDATE   Primary biliary cirrhosis ([place])    cirhosis/liver disease followed by transplant team led by Dr Manuella Ghazi at [place]    S/P CABG (coronary artery bypass graft)    S/P CABG x 2 03/24/2017   LIMA to DIAGONAL Portion of SVG/LEFT RADIAL to LAD   S/P LEEP (loop electrosurgical excision procedure) 05/11/2019   S/P TAH-BSO 05/13/2021   Status post liver transplantation ([place]) 03/30/2019   SVD (spontaneous vaginal delivery)    x 3   Upper GI bleed 02/01/2018   Urinary incontinence    occ   Urinary tract bacterial infections last oct 2021   Wears glasses     Past Surgical History:  Procedure Laterality Date   CERVICAL CONIZATION W/BX N/A 03/22/2020   Procedure: CONIZATION CERVIX WITH BIOPSY;  Surgeon: Dian Queen, MD;  Location: [place];  Service: Gynecology;  Laterality: N/A;   CHOLECYSTECTOMY  09/2019   COLPOSCOPY N/A 03/22/2020   Procedure: COLPOSCOPY;  Surgeon: Dian Queen, MD;  Location: [facility];  Service: Gynecology;  Laterality: N/A;   CORONARY ARTERY BYPASS GRAFT N/A 03/24/2017   Procedure: CORONARY ARTERY BYPASS GRAFTING (CABG) x two, using left internal mammary artery,  left radial artery, and right leg greater saphenous vein harvested endoscopically;  Surgeon: Ivin Poot, MD;  Location: [place];  Service: Open Heart Surgery;  Laterality: N/A;   ENDOVEIN HARVEST OF GREATER SAPHENOUS VEIN Right 03/24/2017   Procedure: ENDOVEIN HARVEST OF GREATER SAPHENOUS VEIN;  Surgeon: Ivin Poot, MD;  Location: [place];  Service: Open Heart Surgery;  Laterality: Right;   ESOPHAGEAL BANDING  02/01/2018   Procedure: ESOPHAGEAL BANDING;  Surgeon: [name], MD;  Location[facility];  Service: Gastroenterology;;   ESOPHAGEAL BANDING N/A 03/29/2018   Procedure: ESOPHAGEAL BANDING;  Surgeon: [name], MD;  Location: [facility];  Service: Gastroenterology;  Laterality: N/A;   ESOPHAGEAL BANDING N/A 05/21/2018   Procedure: ESOPHAGEAL BANDING;  Surgeon: [name], MD;  Location: [facility];  Service: Gastroenterology;  Laterality: N/A;   ESOPHAGEAL BANDING N/A 10/11/2018   Procedure: ESOPHAGEAL BANDING;  Surgeon: [name], MD;  Location: [facility];  Service: Gastroenterology;  Laterality: N/A;   ESOPHAGOGASTRODUODENOSCOPY N/A 03/29/2018   Procedure: ESOPHAGOGASTRODUODENOSCOPY (  EGD);  Surgeon: [name], MD;  Location: [facility];  Service: Gastroenterology;  Laterality: N/A;   ESOPHAGOGASTRODUODENOSCOPY N/A 05/21/2018   Procedure: ESOPHAGOGASTRODUODENOSCOPY (EGD);  Surgeon: [name], MD;  Location: [facility];  Service: Gastroenterology;  Laterality: N/A;   ESOPHAGOGASTRODUODENOSCOPY (EGD) WITH PROPOFOL N/A 02/01/2018   Procedure: ESOPHAGOGASTRODUODENOSCOPY (EGD) WITH PROPOFOL;  Surgeon: [name], MD;  [facility];  Service: Gastroenterology;  Laterality: N/A;   ESOPHAGOGASTRODUODENOSCOPY (EGD) WITH PROPOFOL N/A 10/11/2018   Procedure: ESOPHAGOGASTRODUODENOSCOPY (EGD) WITH PROPOFOL;  Surgeon: [name], MD;  Location: [facility];  Service: Gastroenterology;  Laterality: N/A;   HYSTERECTOMY ABDOMINAL WITH SALPINGO-OOPHORECTOMY Bilateral  05/13/2021   Procedure: TOTAL ABDOMINAL HYSTERECTOMY, BILATERAL SALPINGO OPPHORECTOMY;  Surgeon: Dian Queen, MD;  Location: [place];  Service: Gynecology;  Laterality: Bilateral;   INCONTINENCE SURGERY  2009   urinary  bladder sling   IR IMAGING GUIDED PORT INSERTION  04/08/2019   LEEP N/A 03/28/2014   Procedure: LOOP ELECTROSURGICAL EXCISION PROCEDURE (LEEP) cone biopsy;  Surgeon: Cyril Mourning, MD;  Location: [facility];  Service: Gynecology;  Laterality: N/A;   LEEP N/A 03/22/2020   Procedure: LOOP ELECTROSURGICAL EXCISION PROCEDURE (LEEP);  Surgeon: Dian Queen, MD;  Location: [facility];  Service: Gynecology;  Laterality: N/A;   LEFT HEART CATH AND CORONARY ANGIOGRAPHY N/A 03/23/2017   Procedure: LEFT HEART CATH AND CORONARY ANGIOGRAPHY;  Surgeon: Jettie Booze, MD;  Location: [place] [facility];  Service: Cardiovascular;  Laterality: N/A;   LIVER BIOPSY     x 2   LIVER TRANSPLANT  08/ 24/2021   RADIAL ARTERY HARVEST Left 03/24/2017   Procedure: RADIAL ARTERY HARVEST;  Surgeon: Ivin Poot, MD;  Location: [place];  Service: Open Heart Surgery;  Laterality: Left;   TEE WITHOUT CARDIOVERSION N/A 03/24/2017   Procedure: TRANSESOPHAGEAL ECHOCARDIOGRAM (TEE);  Surgeon: Prescott Gum, [place] Salina, MD;  Location: [place];  Service: Open Heart Surgery;  Laterality: N/A;   WISDOM TOOTH EXTRACTION  yrs ago    Current Medications: Current Meds  Medication Sig   acetaminophen (TYLENOL) 500 MG tablet Take 1,000 mg by mouth every 8 (eight) hours as needed for mild pain.   ASPIRIN LOW DOSE 81 MG EC tablet Take 81 mg by mouth daily.   Biotin 5 MG CAPS Take 5 mg by mouth daily in the afternoon.   Calcium Citrate (CITRACAL PO) Take 1,200 mg by mouth daily.   carvedilol (COREG) 25 MG tablet Take 1/2 tablet (12.5 mg) by mouth 2 times daily with a meal.   cyclobenzaprine (FLEXERIL) 10 MG tablet Take 1 tablet by mouth three times a day.   cycloSPORINE (RESTASIS) 0.05 % ophthalmic emulsion  Place 1 drop into both eyes at bedtime.    ENVARSUS XR 1 MG TB24 Take '1mg'$  (with '4mg'$  tablet for total dose '5mg'$  each morning)   ENVARSUS XR 4 MG TB24 Take one '4mg'$  tablet (with one of the '1mg'$  tablets for total dose '5mg'$  each morning)   ENVARSUS XR 4 MG TB24 Take 1 tablet (4 mg total) by mouth in the morning.   estradiol (VIVELLE-DOT) 0.05 MG/24HR patch Apply 1 patch twice a week   finasteride (PROSCAR) 5 MG tablet Take 1 tablet (5 mg total) by mouth daily. (Patient taking differently: Take 2.5 mg by mouth every evening.)   fluticasone (FLONASE) 50 MCG/ACT nasal spray Place 1 spray into both nostrils daily as needed for allergies or rhinitis.    gabapentin (NEURONTIN) 300 MG capsule Take 1 capsule (300 mg total)  by mouth two (2) times a day. (Patient taking differently: Take 300 mg by mouth in the morning.)   metroNIDAZOLE (METROCREAM) 0.75 % cream Apply 1 application externally to affected area 2 times a day   metroNIDAZOLE (METROCREAM) 0.75 % cream Apply to affected areas twice a day.   nitroGLYCERIN (NITROSTAT) 0.4 MG SL tablet Place 0.4 mg under the tongue every 5 (five) minutes as needed for chest pain.   omeprazole (PRILOSEC) 40 MG capsule Take 1 capsule (40 mg total) by mouth daily.   ondansetron (ZOFRAN) 8 MG tablet Take 1 tablet by mouth every 8 hours as needed for nausea or vomiting.   ursodiol (ACTIGALL) 300 MG capsule Take 1 capsule by mouth 3 times a day.     Allergies:   Codeine, Erythromycin, and Fentanyl   Social History   Socioeconomic History   Marital status: Divorced    Spouse name: Not on file   Number of children: 3   Years of education: 16   Highest education level: Associate degree: academic program  Occupational History   Not on file  Tobacco Use   Smoking status: Never   Smokeless tobacco: Never  Vaping Use   Vaping Use: Never used  Substance and Sexual Activity   Alcohol use: Not Currently    Alcohol/week: 1.0 standard drink of alcohol    Types: 1 Glasses of  wine per week   Drug use: No   Sexual activity: Yes    Partners: Male    Birth control/protection: None  Other Topics Concern   Not on file  Social History Narrative   Not on file   Social Determinants of Health   Financial Resource Strain: Low Risk  (03/23/2017)   Overall Financial Resource Strain (CARDIA)    Difficulty of Paying Living Expenses: Not hard at all  Food Insecurity: No Food Insecurity (03/23/2017)   Hunger Vital Sign    Worried About Running Out of Food in the Last Year: Never true    [place] in the Last Year: Never true  Transportation Needs: No Transportation Needs (03/23/2017)   PRAPARE - Hydrologist (Medical): No    Lack of Transportation (Non-Medical): No  Physical Activity: Inactive (03/23/2017)   Exercise Vital Sign    Days of Exercise per Week: 0 days    Minutes of Exercise per Session: 0 min  Stress: No Stress Concern Present (03/23/2017)   [place]    Feeling of Stress : Not at all  Social Connections: Moderately Integrated (03/23/2017)   Social Connection and Isolation Panel [NHANES]    Frequency of Communication with Friends and Family: More than three times a week    Frequency of Social Gatherings with Friends and Family: More than three times a week    Attends Religious Services: More than 4 times per year    Active Member of [employer] or Organizations: Yes    Attends Music therapist: More than 4 times per year    Marital Status: Divorced     Family History: The patient's family history includes CAD in her father; Diabetes Mellitus II in her father; Heart attack in her brother, paternal grandfather, and paternal grandmother; Heart disease in her father and maternal aunt.  ROS:   Please see the history of present illness.  All other systems reviewed and are negative.  Labs/Other Studies Reviewed:    The following studies were reviewed  today:  Echo 10/16/2019    1. The left ventricle is normal in size with normal wall thickness.    2. The left ventricular systolic function is normal, LVEF is visually  estimated at > 55%.    3. The left atrium is mildly dilated in size.    4. The right ventricle is moderately dilated in size, with mildly reduced  systolic function.    5. There is moderate tricuspid regurgitation.    6. The right atrium is moderately dilated  in size.   Left Ventricle    The left ventricle is normal in size with normal wall thickness.    The left ventricular systolic function is normal, LVEF is visually estimated  at > 55%.  Right Ventricle    The right ventricle is moderately dilated in size, with mildly reduced  systolic function.   Left Atrium    The left atrium is mildly dilated in size.  Right Atrium    The right atrium is moderately dilated  in size.   Aortic Valve    The aortic valve is trileaflet with normal appearing leaflets with normal  excursion.   There is no significant aortic regurgitation.    There is no evidence of a significant transvalvular gradient.   Pulmonic Valve    The pulmonic valve is poorly visualized, but probably normal.    There is no significant pulmonic regurgitation.    There is no evidence of a significant transvalvular gradient.   Mitral Valve    The mitral valve leaflets are normal with normal leaflet mobility.    There is trivial mitral valve regurgitation.   Tricuspid Valve    The tricuspid valve leaflets are normal, with normal leaflet mobility.    There is moderate tricuspid regurgitation.    Pulmonary systolic pressure cannot be estimated due to insufficient TR jet.   Pericardium/Pleural   There is no pericardial effusion.   Inferior Vena Cava    IVC size and inspiratory change suggest mildly elevated right atrial  pressure. (5-10 mmHg).   LHC 03/23/2017   Mid LAD lesion is 99% stenosed. Ost LM lesion is 40% stenosed. Prox RCA lesion is  25% stenosed. The left ventricular systolic function is normal. LV end diastolic pressure is normal. The left ventricular ejection fraction is 55-65% by visual estimate. There is no aortic valve stenosis. If cath was needed in the future, would not use radial approach.   Severe LAD disease at a bifurcation of a large diagonal vessel.  Lesion is suboptimal for PCI.   Plan for cardiac surgery consult.    Due to critical nature of lesion and rest pain, will start heparin and admit.  THere may be some collaterals to the distal LAD based on the flow pattern.    Recent Labs: 05/07/2021: Magnesium 1.7 09/04/2021: ALT 12; B Natriuretic Peptide 188.0; BUN 31; Creatinine, Ser 1.59; Hemoglobin 11.1; Platelets 157; Potassium 5.1; Sodium 138  Recent Lipid Panel    Component Value Date/Time   CHOL 149 06/25/2020 0830   TRIG 206 (H) 06/25/2020 0830   HDL 48 06/25/2020 0830   CHOLHDL 3.1 06/25/2020 0830   CHOLHDL NOT CALCULATED 12/18/2017 0720   VLDL 38 12/18/2017 0720   LDLCALC 67 06/25/2020 0830   LDLDIRECT 229 (H) 12/30/2017 0856     Risk Assessment/Calculations:      Physical Exam:    VS:  BP 120/80   Pulse 70   Ht 5' 1.5" (1.562 m)   Wt 174 lb 12.8 oz (79.3  kg)   LMP 06/24/2017   SpO2 99%   BMI 32.49 kg/m     Wt Readings from Last 3 Encounters:  09/11/21 174 lb 12.8 oz (79.3 kg)  09/04/21 167 lb (75.8 kg)  06/20/21 167 lb 6.4 oz (75.9 kg)     GEN:  Well nourished, well developed in no acute distress HEENT: Normal NECK: No JVD; No carotid bruits CARDIAC: RRR, no murmurs, rubs, gallops RESPIRATORY:  Clear to auscultation without rales, wheezing or rhonchi  ABDOMEN: Soft, non-tender, non-distended MUSCULOSKELETAL:  No edema; No deformity. 2+ pedal pulses, equal bilaterally SKIN: Warm and dry NEUROLOGIC:  Alert and oriented x 3 PSYCHIATRIC:  Normal affect   EKG:  EKG is not ordered today.   Diagnoses:    1. Coronary artery disease involving native coronary artery of  native heart, unspecified whether angina present   2. Hyperlipidemia LDL goal <70   3. Status post liver transplantation ([place])   4. S/P CABG x 2   5. Essential hypertension   6. Status post liver transplant ([place])   7. Atypical chest pain    Assessment and Plan:     Chest pain: She had 3 separate episodes of chest pain 1 week ago.  No further recurrence since that time. No chest pain, dyspnea on exertion.  We discussed stress testing for evaluation of ischemia.  I discussed with Dr. Johnsie Cancel, DOD who advised with no recurrence of symptoms, patient can monitor and notify us if symptoms worsen.  Would recommend Lexiscan Myoview if symptoms reoccur.   CAD with ?angina s/p CABG: History of CABG (LIMA-dominant diagonal, left radial artery free graft to LAD with interposition vein graft at the proximal anastomosis on 03/24/2017).  Negative troponin x1 on 7/12. Discussed with Dr. Johnsie Cancel, she could have failure of radial/interposed vein graft to LAD  but symptoms would likely be more persistent.  No pain with exertion, no recurrence. As noted above, we will plan for lexiscan myoview if symptoms reoccur.  Continue carvedilol, aspirin.   Hyperlipidemia LDL goal < 70: LDL 67 06/25/20. Has an order for repeat lipid panel prior to appointment with Dr. Debara Pickett which is due Sept 2023. Currently not on statin due to significant improvement following liver transplantation. Management per Dr. Debara Pickett.   Hypertension: BP is well-controlled. No concerns for recent BP elevations. Did not have hypertension until after liver transplant. No   S/p liver transplant: Continue to follow-up with [facility].   Disposition: 2-3 months with Dr. Geraldo Pitter   Medication Adjustments/Labs and Tests Ordered: Current medicines are reviewed at length with the patient today.  Concerns regarding medicines are outlined above.  No orders of the defined types were placed in this encounter.  No orders of the defined types were placed in this  encounter.   Patient Instructions  Medication Instructions:   Your physician recommends that you continue on your current medications as directed. Please refer to the Current Medication list given to you today.   *If you need a refill on your cardiac medications before your next appointment, please call your pharmacy*   Lab Work:  None ordered.  If you have labs (blood work) drawn today and your tests are completely normal, you will receive your results only by: [place] (if you have MyChart) OR A paper copy in the mail If you have any lab test that is abnormal or we need to change your treatment, we will call you to review the results.   Testing/Procedures:  None ordered.   Follow-Up:  At [facility], you and your health needs are our priority.  As part of our continuing mission to provide you with exceptional heart care, we have created designated Provider Care Teams.  These Care Teams include your primary Cardiologist (physician) and Advanced Practice Providers (APPs -  Physician Assistants and Nurse Practitioners) who all work together to provide you with the care you need, when you need it.  We recommend signing up for the patient portal called "MyChart".  Sign up information is provided on this After Visit Summary.  MyChart is used to connect with patients for Virtual Visits (Telemedicine).  Patients are able to view lab/test results, encounter notes, upcoming appointments, etc.  Non-urgent messages can be sent to your provider as well.   To learn more about what you can do with MyChart, go to [email or web address].    Your next appointment:   2 month(s)  The format for your next appointment:   In Person  Provider:   Jyl Heinz, MD    Other Instructions   Patient needs appointment with Dr. Debara Pickett for lipid clinic per recall.   Important Information About Sugar         Signed, Emmaline Life, NP  09/11/2021 4:35 PM    [facility]

## 2021-09-11 ENCOUNTER — Encounter: Payer: Self-pay | Admitting: Nurse Practitioner

## 2021-09-11 ENCOUNTER — Ambulatory Visit (INDEPENDENT_AMBULATORY_CARE_PROVIDER_SITE_OTHER): Payer: 59 | Admitting: Nurse Practitioner

## 2021-09-11 VITALS — BP 120/80 | HR 70 | Ht 61.5 in | Wt 174.8 lb

## 2021-09-11 DIAGNOSIS — I10 Essential (primary) hypertension: Secondary | ICD-10-CM

## 2021-09-11 DIAGNOSIS — I2510 Atherosclerotic heart disease of native coronary artery without angina pectoris: Secondary | ICD-10-CM | POA: Diagnosis not present

## 2021-09-11 DIAGNOSIS — Z951 Presence of aortocoronary bypass graft: Secondary | ICD-10-CM

## 2021-09-11 DIAGNOSIS — R0789 Other chest pain: Secondary | ICD-10-CM | POA: Diagnosis not present

## 2021-09-11 DIAGNOSIS — E785 Hyperlipidemia, unspecified: Secondary | ICD-10-CM

## 2021-09-11 DIAGNOSIS — Z944 Liver transplant status: Secondary | ICD-10-CM | POA: Diagnosis not present

## 2021-09-11 NOTE — Patient Instructions (Signed)
Medication Instructions:   Your physician recommends that you continue on your current medications as directed. Please refer to the Current Medication list given to you today.   *If you need a refill on your cardiac medications before your next appointment, please call your pharmacy*   Lab Work:  None ordered.  If you have labs (blood work) drawn today and your tests are completely normal, you will receive your results only by: [place] (if you have MyChart) OR A paper copy in the mail If you have any lab test that is abnormal or we need to change your treatment, we will call you to review the results.   Testing/Procedures:  None ordered.   Follow-Up: At [facility], you and your health needs are our priority.  As part of our continuing mission to provide you with exceptional heart care, we have created designated Provider Care Teams.  These Care Teams include your primary Cardiologist (physician) and Advanced Practice Providers (APPs -  Physician Assistants and Nurse Practitioners) who all work together to provide you with the care you need, when you need it.  We recommend signing up for the patient portal called "MyChart".  Sign up information is provided on this After Visit Summary.  MyChart is used to connect with patients for Virtual Visits (Telemedicine).  Patients are able to view lab/test results, encounter notes, upcoming appointments, etc.  Non-urgent messages can be sent to your provider as well.   To learn more about what you can do with MyChart, go to [email or web address].    Your next appointment:   2 month(s)  The format for your next appointment:   In Person  Provider:   Jyl Heinz, MD    Other Instructions   Patient needs appointment with Dr. Debara Pickett for lipid clinic per recall.   Important Information About Sugar

## 2021-09-20 ENCOUNTER — Other Ambulatory Visit (HOSPITAL_COMMUNITY): Payer: Self-pay

## 2021-09-25 ENCOUNTER — Ambulatory Visit: Payer: 59 | Admitting: Cardiology

## 2021-09-30 ENCOUNTER — Other Ambulatory Visit (HOSPITAL_COMMUNITY): Payer: Self-pay

## 2021-09-30 ENCOUNTER — Ambulatory Visit
Admit: 2021-09-30 | Discharge: 2021-10-01 | Payer: PRIVATE HEALTH INSURANCE | Attending: Student in an Organized Health Care Education/Training Program | Primary: Student in an Organized Health Care Education/Training Program

## 2021-09-30 DIAGNOSIS — M898X9 Other specified disorders of bone, unspecified site: Principal | ICD-10-CM

## 2021-09-30 DIAGNOSIS — M79672 Pain in left foot: Principal | ICD-10-CM

## 2021-09-30 DIAGNOSIS — M79671 Pain in right foot: Principal | ICD-10-CM

## 2021-09-30 DIAGNOSIS — Z944 Liver transplant status: Principal | ICD-10-CM

## 2021-10-02 ENCOUNTER — Other Ambulatory Visit (HOSPITAL_COMMUNITY): Payer: Self-pay

## 2021-10-03 IMAGING — XA IR IMAGING GUIDED PORT INSERTION
1 series · 1 of 1 positions shown · non-contrast
Comparison: none

CLINICAL DATA: Primary biliary cirrhosis and need for periodic
intravenous infusions. Port placement requested due to poor
peripheral intravenous access.

[Series 1: fl (-) angio · 1 of 1 slices shown]
[im 1/1]
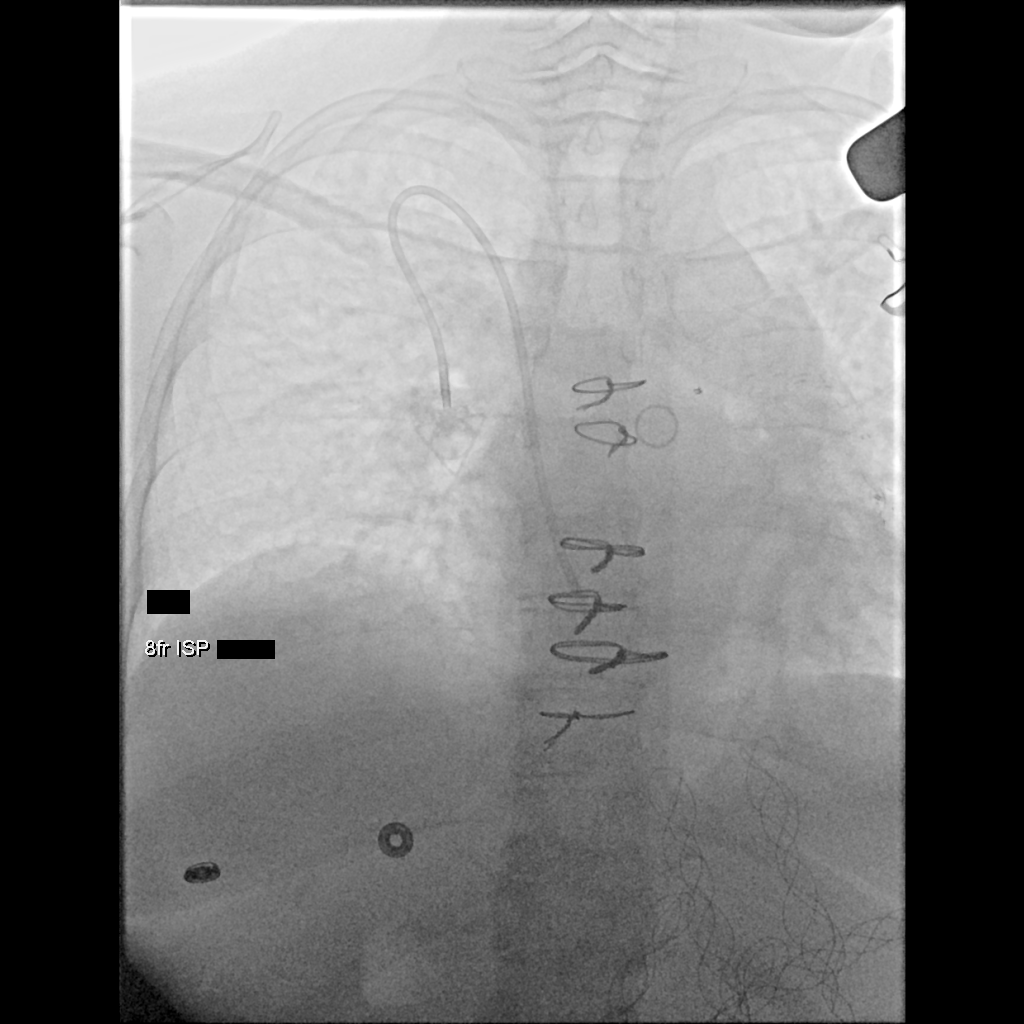

[1 of 1 positions shown; findings below may reference images not displayed]

EXAM:
IMPLANTED PORT A CATH PLACEMENT WITH ULTRASOUND AND FLUOROSCOPIC
GUIDANCE

ANESTHESIA/SEDATION:
1.5 mg IV Versed; 0.5 mg IV Dilaudid

Total Moderate Sedation Time:  35 minutes

The patient's level of consciousness and physiologic status were
continuously monitored during the procedure by Radiology nursing.

Additional Medications: 2 g IV Ancef, 4 mg IV Zofran.

FLUOROSCOPY TIME:  48 seconds.  2.5 mGy.

PROCEDURE:
The procedure, risks, benefits, and alternatives were explained to
the patient. Questions regarding the procedure were encouraged and
answered. The patient understands and consents to the procedure. A
time-out was performed prior to initiating the procedure.

Ultrasound was utilized to confirm patency of the right internal
jugular vein. The right neck and chest were prepped with
chlorhexidine in a sterile fashion, and a sterile drape was applied
covering the operative field. Maximum barrier sterile technique with
sterile gowns and gloves were used for the procedure. Local
anesthesia was provided with 1% lidocaine.

After creating a small venotomy incision, a 21 gauge needle was
advanced into the right internal jugular vein under direct,
real-time ultrasound guidance. Ultrasound image documentation was
performed. After securing guidewire access, an 8 Fr dilator was
placed. A J-wire was kinked to measure appropriate catheter length.

A subcutaneous port pocket was then created along the upper chest
wall utilizing sharp and blunt dissection. Portable cautery was
utilized. The pocket was irrigated with sterile saline.

A single lumen power injectable port was chosen for placement. The 8
Fr catheter was tunneled from the port pocket site to the venotomy
incision. The port was placed in the pocket. External catheter was
trimmed to appropriate length based on guidewire measurement.

At the venotomy, an 8 Fr peel-away sheath was placed over a
guidewire. The catheter was then placed through the sheath and the
sheath removed. Final catheter positioning was confirmed and
documented with a fluoroscopic spot image. The port was accessed
with a needle and aspirated and flushed with heparinized saline. The
access needle was removed.

The venotomy and port pocket incisions were closed with subcutaneous
3-0 Monocryl and subcuticular 4-0 Vicryl. Dermabond was applied to
both incisions.

COMPLICATIONS:
COMPLICATIONS
None
FINDINGS: After catheter placement, the tip lies at the Ramiar junction.
The catheter aspirates normally and is ready for immediate use.
IMPRESSION: Placement of single lumen port a cath via right internal jugular
vein. The catheter tip lies at the Ramiar junction. A power
injectable port a cath was placed and is ready for immediate use.

## 2021-10-07 DIAGNOSIS — Z79899 Other long term (current) drug therapy: Principal | ICD-10-CM

## 2021-10-07 DIAGNOSIS — Z944 Liver transplant status: Principal | ICD-10-CM

## 2021-10-09 DIAGNOSIS — Z944 Liver transplant status: Principal | ICD-10-CM

## 2021-10-09 DIAGNOSIS — Z79899 Other long term (current) drug therapy: Principal | ICD-10-CM

## 2021-10-10 ENCOUNTER — Other Ambulatory Visit (HOSPITAL_COMMUNITY): Payer: Self-pay

## 2021-10-19 ENCOUNTER — Other Ambulatory Visit (HOSPITAL_COMMUNITY): Payer: Self-pay

## 2021-10-19 DIAGNOSIS — R208 Other disturbances of skin sensation: Principal | ICD-10-CM

## 2021-10-19 DIAGNOSIS — Z944 Liver transplant status: Principal | ICD-10-CM

## 2021-10-19 MED ORDER — GABAPENTIN 300 MG CAPSULE
ORAL_CAPSULE | Freq: Two times a day (BID) | ORAL | 6 refills | 0 days
Start: 2021-10-19 — End: ?

## 2021-10-21 ENCOUNTER — Other Ambulatory Visit (HOSPITAL_COMMUNITY): Payer: Self-pay

## 2021-10-21 MED ORDER — GABAPENTIN 300 MG CAPSULE
ORAL_CAPSULE | Freq: Two times a day (BID) | ORAL | 6 refills | 30 days
Start: 2021-10-21 — End: 2022-10-21

## 2021-10-22 ENCOUNTER — Other Ambulatory Visit (HOSPITAL_COMMUNITY): Payer: Self-pay

## 2021-10-23 ENCOUNTER — Ambulatory Visit: Admit: 2021-10-23 | Discharge: 2021-10-23 | Payer: PRIVATE HEALTH INSURANCE

## 2021-10-23 DIAGNOSIS — Z944 Liver transplant status: Principal | ICD-10-CM

## 2021-10-23 DIAGNOSIS — K8689 Other specified diseases of pancreas: Principal | ICD-10-CM

## 2021-10-23 DIAGNOSIS — B2790 Infectious mononucleosis, unspecified without complication: Principal | ICD-10-CM

## 2021-10-23 DIAGNOSIS — B259 Cytomegaloviral disease, unspecified: Principal | ICD-10-CM

## 2021-10-23 DIAGNOSIS — K869 Disease of pancreas, unspecified: Principal | ICD-10-CM

## 2021-10-23 DIAGNOSIS — K900 Celiac disease: Principal | ICD-10-CM

## 2021-10-23 DIAGNOSIS — D649 Anemia, unspecified: Principal | ICD-10-CM

## 2021-10-23 DIAGNOSIS — R197 Diarrhea, unspecified: Principal | ICD-10-CM

## 2021-10-23 DIAGNOSIS — K862 Cyst of pancreas: Secondary | ICD-10-CM | POA: Diagnosis not present

## 2021-10-23 DIAGNOSIS — E785 Hyperlipidemia, unspecified: Secondary | ICD-10-CM | POA: Diagnosis not present

## 2021-10-23 DIAGNOSIS — D849 Immunodeficiency, unspecified: Secondary | ICD-10-CM | POA: Diagnosis not present

## 2021-10-23 DIAGNOSIS — K7460 Unspecified cirrhosis of liver: Secondary | ICD-10-CM | POA: Diagnosis not present

## 2021-10-23 DIAGNOSIS — Z09 Encounter for follow-up examination after completed treatment for conditions other than malignant neoplasm: Secondary | ICD-10-CM | POA: Diagnosis not present

## 2021-10-23 DIAGNOSIS — K529 Noninfective gastroenteritis and colitis, unspecified: Secondary | ICD-10-CM | POA: Diagnosis not present

## 2021-10-23 DIAGNOSIS — K831 Obstruction of bile duct: Secondary | ICD-10-CM | POA: Diagnosis not present

## 2021-10-23 DIAGNOSIS — I2510 Atherosclerotic heart disease of native coronary artery without angina pectoris: Secondary | ICD-10-CM | POA: Diagnosis not present

## 2021-10-23 DIAGNOSIS — K838 Other specified diseases of biliary tract: Secondary | ICD-10-CM | POA: Diagnosis not present

## 2021-10-24 ENCOUNTER — Other Ambulatory Visit (HOSPITAL_COMMUNITY): Payer: Self-pay

## 2021-10-25 ENCOUNTER — Other Ambulatory Visit (HOSPITAL_COMMUNITY): Payer: Self-pay

## 2021-10-30 ENCOUNTER — Other Ambulatory Visit (HOSPITAL_COMMUNITY): Payer: Self-pay

## 2021-11-04 ENCOUNTER — Other Ambulatory Visit (HOSPITAL_COMMUNITY): Payer: Self-pay

## 2021-11-04 DIAGNOSIS — Z944 Liver transplant status: Principal | ICD-10-CM

## 2021-11-04 DIAGNOSIS — Z79899 Other long term (current) drug therapy: Principal | ICD-10-CM

## 2021-11-09 ENCOUNTER — Other Ambulatory Visit (HOSPITAL_COMMUNITY): Payer: Self-pay

## 2021-11-13 ENCOUNTER — Other Ambulatory Visit (HOSPITAL_COMMUNITY): Payer: Self-pay

## 2021-11-18 ENCOUNTER — Encounter: Payer: Self-pay | Admitting: Endocrinology

## 2021-11-18 ENCOUNTER — Ambulatory Visit: Payer: 59 | Admitting: Cardiology

## 2021-11-18 ENCOUNTER — Ambulatory Visit (INDEPENDENT_AMBULATORY_CARE_PROVIDER_SITE_OTHER): Payer: 59 | Admitting: Endocrinology

## 2021-11-18 VITALS — BP 124/82 | HR 64 | Ht 61.0 in | Wt 177.4 lb

## 2021-11-18 DIAGNOSIS — R635 Abnormal weight gain: Secondary | ICD-10-CM

## 2021-11-18 DIAGNOSIS — M818 Other osteoporosis without current pathological fracture: Secondary | ICD-10-CM

## 2021-11-18 DIAGNOSIS — E785 Hyperlipidemia, unspecified: Secondary | ICD-10-CM | POA: Diagnosis not present

## 2021-11-18 DIAGNOSIS — Z944 Liver transplant status: Secondary | ICD-10-CM | POA: Diagnosis not present

## 2021-11-18 DIAGNOSIS — E559 Vitamin D deficiency, unspecified: Secondary | ICD-10-CM

## 2021-11-18 LAB — LIPID PANEL
Chol/HDL Ratio: 3.4 ratio (ref 0.0–4.4)
Cholesterol, Total: 146 mg/dL (ref 100–199)
HDL: 43 mg/dL (ref >39)
LDL Chol Calc (NIH): 86 mg/dL (ref 0–99)
Triglycerides: 91 mg/dL (ref 0–149)
VLDL Cholesterol Cal: 17 mg/dL (ref 5–40)

## 2021-11-18 NOTE — Progress Notes (Unsigned)
Patient ID: Christine Butler, female   DOB: 12-Jan-1971, 51 y.o.   MRN: [id number]             Chief complaint: Follow-up of osteoporosis  History of Present Illness:   She had a screening bone density done in 02/2018 because of her chronic liver disease as pretransplant evaluation Results of the bone density were as follows:  Spine:  The  Z score is -2.4 and the T score is -3.0.  This value is below the fracture risk threshold.  The total bone mineral density in the proximal left femur measures 0.667 gm/cm2.  The Z score is -1.9 and the T score is -2.3.  This value is below the fracture risk threshold.   The femoral neck density is 0.532 gm/cm2, and the T score is -2.9.  The other T scores range from -2.4 to -1.6.   Most recent BONE DENSITY in 09/2020 done elsewhere shows the following Lumbar spine not evaluated Total hip T score = -2.8 Left femoral neck T score = -3.4, no significant change  Previous height 5 feet 1-1/2 inches, today's height is the same  She has no history of low trauma fracture Mild L5 compression deformity noticed on MRI in 02/2018, unchanged from prior, asymptomatic No recent symptoms of acute back pain Current not taking prednisone  RECLAST infusion history: 12/13/18, 12/04/2020  Calcium supplements: using Citracal 1200 mg with 1000 U D3 Also has D3 1000 U in her multivitamin She also is now on estradiol patch 0.05 mg twice a week from her gynecologist  Baseline vitamin D level was about 21    LABS:  Last calcium level 8.7 with albumin 3.5 on 02/08/19  Baseline vitamin D level done at [facility]: On 11/29/2026 was 21.5  Lab Results  Component Value Date   CREATININE 1.59 (H) 09/04/2021   CREATININE 1.35 (H) 05/14/2021   CREATININE 1.21 (H) 05/07/2021     Lab Results  Component Value Date   VD25OH 30.82 06/20/2021     Past Medical History:  Diagnosis Date   (HFpEF) heart failure with preserved ejection fraction ([place]) 08/09/2019   Acute blood loss  anemia 04/20/2017   Altered mental status 08/14/2019   Arthritis    right knee   Ascites--mild this admit 11/06/2017   Atypical chest pain 11/05/2017   Atypical squamous cells of undetermined significance (ASCUS) on Papanicolaou smear of cervix 08/05/2018   CAD (coronary artery disease)    a. s/p CABGx2 in 02/2017 (LAD not suitable for PCI), EF normal.   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   Chronic low back pain    Cirrhosis ([place]) 03/16/2017   Coronary artery disease 03/24/2017   COVID-19 09/18/2020   Current episode of major depressive disorder without prior episode 03/13/2020   Dyslipidemia, goal LDL below 70 10/13/2017   Elevated LFTs 04/20/2017   Encephalopathy 09/29/2019   hepatic encephalopathy   Familial hyperlipidemia    GERD (gastroesophageal reflux disease)    GI bleed 02/01/2018   H/O LEEP 03/30/2019   H/O two vessel coronary artery bypass graft 03/30/2019   High grade squamous intraepithelial lesion (HGSIL) on cytologic smear of cervix 05/04/2019   History of gastrostomy, has currently [facility]) 07/12/2020   Formatting of this note might be different from the original. Patient underwent ERCP 07/11/20. Procedure c/b hepaticogastrostomy stent placement.   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 IMOUPDATE   Primary biliary cirrhosis ([place])    cirhosis/liver disease followed by transplant team led by  Dr Manuella Ghazi at [place]    S/P CABG (coronary artery bypass graft)    S/P CABG x 2 03/24/2017   LIMA to DIAGONAL Portion of SVG/LEFT RADIAL to LAD   S/P LEEP (loop electrosurgical excision procedure) 05/11/2019   S/P TAH-BSO 05/13/2021   Status post liver transplantation ([place]) 03/30/2019   SVD (spontaneous vaginal delivery)    x 3   Upper GI bleed 02/01/2018   Urinary incontinence    occ   Urinary tract bacterial infections last oct 2021   Wears glasses     Past Surgical History:  Procedure Laterality Date   CERVICAL CONIZATION W/BX N/A 03/22/2020   Procedure: CONIZATION CERVIX WITH BIOPSY;  Surgeon: Dian Queen, MD;  Location: [place];  Service: Gynecology;  Laterality: N/A;   CHOLECYSTECTOMY  09/2019   COLPOSCOPY N/A 03/22/2020   Procedure: COLPOSCOPY;  Surgeon: Dian Queen, MD;  Location: [facility];  Service: Gynecology;  Laterality: N/A;   CORONARY ARTERY BYPASS GRAFT N/A 03/24/2017   Procedure: CORONARY ARTERY BYPASS GRAFTING (CABG) x two, using left internal mammary artery, left radial artery, and right leg greater saphenous vein harvested endoscopically;  Surgeon: Ivin Poot, MD;  Location: [place];  Service: Open Heart Surgery;  Laterality: N/A;   ENDOVEIN HARVEST OF GREATER SAPHENOUS VEIN Right 03/24/2017   Procedure: ENDOVEIN HARVEST OF GREATER SAPHENOUS VEIN;  Surgeon: Ivin Poot, MD;  Location: [place];  Service: Open Heart Surgery;  Laterality: Right;   ESOPHAGEAL BANDING  02/01/2018   Procedure: ESOPHAGEAL BANDING;  Surgeon: [name], MD;  Location: [facility];  Service: Gastroenterology;;   ESOPHAGEAL BANDING N/A 03/29/2018   Procedure: ESOPHAGEAL BANDING;  Surgeon: [name], MD;  Location: [facility];  Service: Gastroenterology;  Laterality: N/A;   ESOPHAGEAL BANDING N/A 05/21/2018   Procedure: ESOPHAGEAL BANDING;  Surgeon: [name], MD;  Location: [facility];  Service: Gastroenterology;  Laterality: N/A;    ESOPHAGEAL BANDING N/A 10/11/2018   Procedure: ESOPHAGEAL BANDING;  Surgeon: [name], MD;  Location: [facility];  Service: Gastroenterology;  Laterality: N/A;   ESOPHAGOGASTRODUODENOSCOPY N/A 03/29/2018   Procedure: ESOPHAGOGASTRODUODENOSCOPY (EGD);  Surgeon: [name], MD;  Location: [facility];  Service: Gastroenterology;  Laterality: N/A;   ESOPHAGOGASTRODUODENOSCOPY N/A 05/21/2018   Procedure: ESOPHAGOGASTRODUODENOSCOPY (EGD);  Surgeon: [name], MD;  Location: [facility];  Service: Gastroenterology;  Laterality: N/A;   ESOPHAGOGASTRODUODENOSCOPY (EGD) WITH PROPOFOL N/A 02/01/2018   Procedure: ESOPHAGOGASTRODUODENOSCOPY (EGD) WITH PROPOFOL;  Surgeon: [name], MD;  Location: [facility];  Service: Gastroenterology;  Laterality: N/A;   ESOPHAGOGASTRODUODENOSCOPY (EGD) WITH PROPOFOL N/A 10/11/2018   Procedure: ESOPHAGOGASTRODUODENOSCOPY (EGD) WITH PROPOFOL;  Surgeon: [name], MD;  Location: [facility];  Service: Gastroenterology;  Laterality: N/A;   HYSTERECTOMY ABDOMINAL WITH SALPINGO-OOPHORECTOMY Bilateral 05/13/2021   Procedure: TOTAL ABDOMINAL HYSTERECTOMY, BILATERAL SALPINGO OPPHORECTOMY;  Surgeon: Dian Queen, MD;  Location: [facility];  Service: Gynecology;  Laterality: Bilateral;   INCONTINENCE SURGERY  2009   urinary  bladder sling   IR IMAGING GUIDED PORT INSERTION  04/08/2019   LEEP N/A 03/28/2014   Procedure: LOOP ELECTROSURGICAL EXCISION PROCEDURE (LEEP) cone biopsy;  Surgeon: Cyril Mourning, MD;  Location: [facility];  Service: Gynecology;  Laterality: N/A;   LEEP N/A 03/22/2020   Procedure: LOOP ELECTROSURGICAL EXCISION PROCEDURE (LEEP);  Surgeon: Dian Queen, MD;  Location: [facility];  Service: Gynecology;  Laterality: N/A;   LEFT HEART CATH AND CORONARY ANGIOGRAPHY N/A 03/23/2017   Procedure: LEFT HEART CATH AND CORONARY ANGIOGRAPHY;  Surgeon: Jettie Booze, MD;  Location: [place] [facility];  Service: Cardiovascular;  Laterality: N/A;    LIVER BIOPSY     x 2   LIVER TRANSPLANT  08/ 24/2021   RADIAL ARTERY HARVEST Left 03/24/2017   Procedure: RADIAL ARTERY HARVEST;  Surgeon: Ivin Poot, MD;  Location: [place];  Service: Open Heart Surgery;  Laterality: Left;   TEE WITHOUT CARDIOVERSION N/A 03/24/2017   Procedure: TRANSESOPHAGEAL ECHOCARDIOGRAM (TEE);  Surgeon: Prescott Gum, Collier Salina, MD;  Location: [place];  Service: Open Heart Surgery;  Laterality: N/A;   WISDOM TOOTH EXTRACTION  yrs ago    Family History  Problem Relation Age of Onset   CAD Father    Diabetes Mellitus II Father    Heart disease Father    Heart attack Brother    Heart disease Maternal Aunt    Heart attack Paternal Grandmother    Heart attack Paternal Grandfather     Social History:  reports that she has never smoked. She has never used smokeless tobacco. She reports that she does not currently use alcohol after a past usage of about 1.0 standard drink of alcohol per week. She reports that she does not use drugs.  Allergies:  Allergies  Allergen Reactions   Codeine Nausea And Vomiting    Has taken this medication since liver transplant without excessive nausea and vomiting   Erythromycin Nausea And Vomiting   Fentanyl Nausea And Vomiting    Has taken this medication since liver transplant without excessive nausea and vomiting    Allergies as of 11/18/2021       Reactions   Codeine Nausea And Vomiting   Has taken this medication since liver transplant without excessive nausea and vomiting   Erythromycin Nausea And Vomiting   Fentanyl Nausea And Vomiting   Has taken this medication since liver transplant without excessive nausea and vomiting        Medication List        Accurate as of November 18, 2021  3:19 PM. If you have any questions, ask your nurse or doctor.          acetaminophen 500 MG tablet Commonly known as: TYLENOL Take 1,000 mg by mouth every 8 (eight) hours as needed for mild pain.   Aspirin Low Dose 81 MG  tablet Generic drug: aspirin EC Take 81 mg by mouth daily.   Biotin 5 MG Caps Take 5 mg by mouth daily in the afternoon.   carvedilol 25 MG tablet Commonly known as: COREG Take 1/2 tablet (12.5 mg) by mouth 2 times daily with a meal.   CITRACAL PO Take 1,200 mg by mouth daily.   cyclobenzaprine 10 MG tablet Commonly known as: FLEXERIL Take 1 tablet by mouth three times a day.   DEKAs Plus Caps Take 1 tablet by mouth daily.  Envarsus XR 1 MG Tb24 Generic drug: tacrolimus ER Take '1mg'$  (with '4mg'$  tablet for total dose '5mg'$  each morning)   Envarsus XR 4 MG Tb24 Generic drug: tacrolimus ER Take one '4mg'$  tablet (with one of the '1mg'$  tablets for total dose '5mg'$  each morning)   Envarsus XR 4 MG Tb24 Generic drug: tacrolimus ER Take 1 tablet (4 mg total) by mouth in the morning.   estradiol 0.05 MG/24HR patch Commonly known as: VIVELLE-DOT Apply 1 patch twice a week   finasteride 5 MG tablet Commonly known as: PROSCAR Take 1 tablet (5 mg total) by mouth daily. What changed:  how much to take when to take this   fluticasone 50 MCG/ACT nasal spray Commonly known as: FLONASE Place 1 spray into both nostrils daily as needed for allergies or rhinitis.   gabapentin 300 MG capsule Commonly known as: NEURONTIN Take 1 capsule (300 mg total) by mouth two (2) times a day. What changed:  how much to take how to take this when to take this   levofloxacin 500 MG tablet Commonly known as: LEVAQUIN Take 1 tablet once a day for 5 days after your ERCP; start on Tuesday, Feb 21.   metroNIDAZOLE 0.75 % cream Commonly known as: MetroCream Apply 1 application externally to affected area 2 times a day   metroNIDAZOLE 0.75 % cream Commonly known as: MetroCream Apply to affected areas twice a day.   nitroGLYCERIN 0.4 MG SL tablet Commonly known as: NITROSTAT Place 0.4 mg under the tongue every 5 (five) minutes as needed for chest pain.   omeprazole 40 MG capsule Commonly known as:  PRILOSEC Take 1 capsule (40 mg total) by mouth daily.   ondansetron 8 MG tablet Commonly known as: Zofran Take 1 tablet by mouth every 8 hours as needed for nausea or vomiting.   oxyCODONE 5 MG immediate release tablet Commonly known as: Oxy IR/ROXICODONE Take 1 tablet  by mouth every 4  hours as needed.   oxyCODONE 5 MG immediate release tablet Commonly known as: Oxy IR/ROXICODONE Take 1-2 tablets (5-10 mg total) by mouth every 4 (four) hours as needed for moderate pain.   progesterone 200 MG capsule Commonly known as: Prometrium Take 1 capsule by mouth every day for 12 days.   promethazine 25 MG tablet Commonly known as: PHENERGAN Take 2 tablets (50 mg total) by mouth every 6 (six) hours as needed for nausea or vomiting.   Restasis 0.05 % ophthalmic emulsion Generic drug: cycloSPORINE Place 1 drop into both eyes at bedtime.   ursodiol 300 MG capsule Commonly known as: ACTIGALL Take 1 capsule by mouth 3 times a day.         Review of Systems  She has had primary biliary cirrhosis since 2006 and has had successful liver transplant  No history of thyroid disease  Lab Results  Component Value Date   TSH 2.360 10/11/2019   FREET4 1.60 10/11/2019    PHYSICAL EXAM:  BP 124/82   Pulse 64   Ht '5\' 1"'$  (1.549 m)   Wt 177 lb 6.4 oz (80.5 kg)   LMP 06/24/2017   SpO2 99%   BMI 33.52 kg/m     ASSESSMENT:   OSTEOPOROSIS with lowest baseline T score -3.0  Osteoporosis likely related to her severe liver disease She does have an old mild L5 compression deformity on MRI scan since 2019 at least  Her Reclast treatment has been intermittent because of her other health problems and has had only 2 infusions  With her bone density being slightly lower in 8/22 she got Reclast in 11/2020 Currently asymptomatic Vitamin D level is low normal Has had variable renal function.  Liver  Weight management: She thinks she is has inappropriate difficulty losing weight despite  diet and exercise regimen However unlikely that this is related to any metabolic issue may be related to previous steroid use and postmenopausal status   PLAN:   Check renal function and calcium again today Continue calcium supplements  Reclast to be done in October Thyroid screening will be done She likely can benefit from the weight management program either here or with her PCP    Christine Butler 11/18/2021, 3:19 PM

## 2021-11-18 NOTE — Patient Instructions (Signed)
Vitamin D3 1000 units daily

## 2021-11-19 ENCOUNTER — Other Ambulatory Visit (HOSPITAL_COMMUNITY): Payer: Self-pay

## 2021-11-19 ENCOUNTER — Encounter: Payer: Self-pay | Admitting: Endocrinology

## 2021-11-19 DIAGNOSIS — M818 Other osteoporosis without current pathological fracture: Secondary | ICD-10-CM

## 2021-11-19 HISTORY — DX: Other osteoporosis without current pathological fracture: M81.8

## 2021-11-19 LAB — BASIC METABOLIC PANEL
BUN: 32 mg/dL — ABNORMAL HIGH (ref 6–23)
CO2: 23 mEq/L (ref 19–32)
Calcium: 8.9 mg/dL (ref 8.4–10.5)
Chloride: 106 mEq/L (ref 96–112)
Creatinine, Ser: 1.48 mg/dL — ABNORMAL HIGH (ref 0.40–1.20)
GFR: 40.7 mL/min — ABNORMAL LOW (ref >60.00)
Glucose, Bld: 90 mg/dL (ref 70–99)
Potassium: 5.2 mEq/L — ABNORMAL HIGH (ref 3.5–5.1)
Sodium: 136 mEq/L (ref 135–145)

## 2021-11-19 LAB — T4, FREE: Free T4: 1.08 ng/dL (ref 0.60–1.60)

## 2021-11-19 LAB — TSH: TSH: 2.02 u[IU]/mL (ref 0.35–5.50)

## 2021-11-26 ENCOUNTER — Encounter: Payer: Self-pay | Admitting: Endocrinology

## 2021-12-02 DIAGNOSIS — Z79899 Other long term (current) drug therapy: Principal | ICD-10-CM

## 2021-12-02 DIAGNOSIS — Z944 Liver transplant status: Principal | ICD-10-CM

## 2021-12-04 ENCOUNTER — Other Ambulatory Visit (HOSPITAL_COMMUNITY): Payer: Self-pay

## 2021-12-05 ENCOUNTER — Telehealth: Payer: Self-pay | Admitting: Pharmacy Technician

## 2021-12-05 NOTE — Telephone Encounter (Addendum)
Auth Submission: no auth needed Payer: [employer] CPT/J Code(s) submitted reclast Route of submission (phone, fax, portal): PORTAL Phone # Fax # Auth type: Buy/Bill Units/visits requested: X1 DOSE Reference number: case id [id number] Approval from:12/12/21  to 02/23/22  at [facility]

## 2021-12-06 DIAGNOSIS — K8689 Other specified diseases of pancreas: Principal | ICD-10-CM

## 2021-12-06 DIAGNOSIS — K900 Celiac disease: Principal | ICD-10-CM

## 2021-12-06 DIAGNOSIS — R197 Diarrhea, unspecified: Principal | ICD-10-CM

## 2021-12-06 DIAGNOSIS — Z944 Liver transplant status: Principal | ICD-10-CM

## 2021-12-06 DIAGNOSIS — B2790 Infectious mononucleosis, unspecified without complication: Principal | ICD-10-CM

## 2021-12-06 DIAGNOSIS — B259 Cytomegaloviral disease, unspecified: Principal | ICD-10-CM

## 2021-12-09 ENCOUNTER — Other Ambulatory Visit (HOSPITAL_COMMUNITY): Payer: Self-pay

## 2021-12-11 ENCOUNTER — Ambulatory Visit: Payer: 59 | Attending: Internal Medicine | Admitting: Internal Medicine

## 2021-12-11 ENCOUNTER — Encounter: Payer: Self-pay | Admitting: Internal Medicine

## 2021-12-11 ENCOUNTER — Other Ambulatory Visit (HOSPITAL_COMMUNITY): Payer: Self-pay

## 2021-12-11 VITALS — BP 142/88 | HR 67 | Ht 61.5 in | Wt 178.6 lb

## 2021-12-11 DIAGNOSIS — Z944 Liver transplant status: Secondary | ICD-10-CM

## 2021-12-11 DIAGNOSIS — K743 Primary biliary cirrhosis: Secondary | ICD-10-CM | POA: Diagnosis not present

## 2021-12-11 DIAGNOSIS — I2510 Atherosclerotic heart disease of native coronary artery without angina pectoris: Secondary | ICD-10-CM

## 2021-12-11 DIAGNOSIS — E785 Hyperlipidemia, unspecified: Secondary | ICD-10-CM

## 2021-12-11 DIAGNOSIS — Z951 Presence of aortocoronary bypass graft: Secondary | ICD-10-CM | POA: Diagnosis not present

## 2021-12-11 MED ORDER — ROSUVASTATIN CALCIUM 5 MG PO TABS
5.0000 mg | ORAL_TABLET | Freq: Every day | ORAL | 3 refills | Status: DC
  Filled 2021-12-11: qty 90, 90d supply, fill #0
  Filled 2022-02-25 – 2022-03-11 (×2): qty 90, 90d supply, fill #1
  Filled 2022-06-11: qty 90, 90d supply, fill #2
  Filled 2022-09-10: qty 90, 90d supply, fill #3

## 2021-12-11 NOTE — Progress Notes (Addendum)
OFFICE CONSULT NOTE  Chief Complaint:  Follow-up dyslipidemia  Primary Care Physician: Scifres, Dorothy, PA-C (Inactive)  HPI:  Christine Butler is a 51 y.o. female who is being seen today for the evaluation of dyslipidemia at the request of Dr. Prescott Gum / [place]. This is a pleasant 51 year old female who unfortunately underwent urgent coronary bypass surgery in January 2019 after complaining of symptoms concerning for unstable angina.  She was found to have two-vessel coronary disease and underwent LIMA to Diagonal and left free radial artery graft to the LAD with interposition at the proximal anastamosis).  Other medical problems include PBC, IBS and GERD.  She has recently been followed by Dr. [name], with hepatology and liver transplant service at [facility].  She has persistent jaundice with elevated liver enzymes.  There is also strong family history of heart disease.  Her father had two-vessel bypass at age 67.  She had a brother died of MI.  Her paternal grandfather and grandmother both had MIs in their 63s.  She also has an aunt who has multiple cardiac issues.  She does have one sister who has normal cholesterol.  In addition she has 3 children aged 61, 49 and 55, none of which you have had cholesterol testing.  Most recently her lipid profile showed a total cholesterol 449, HDL of 11, triglycerides 333 and LDL 371.  Obviously a very abnormal lipid profile concerning for HeFH.  12/17/2017  Christine Butler returns today for follow-up.  In the interim she has been seen by her hepatologist at [facility].  She underwent a fibro-scan of the liver which suggested cirrhosis.  She has follow-up with him fairly soon to discuss further work-up options but is contemplating liver transplant.  She says she has several friends and a sister who is considering a living related donor option if she were a Orthoptist.  This obviously has the potential to perhaps care her familial hyperlipidemia however in the  interim awaiting a possible transplant she has significant increased cardiovascular risk.  We again reviewed the options of treatment including a less hepatically metabolized statin, obviously there is still risk for worsening liver enzymes, or PCSK9 inhibitor therapy.  PCSK9 inhibitor therapy in the interim would be likely the best tolerated therapy with the most significant reduction in cholesterol.  Of course this would be at the blessing of her hepatologist who I have already spoken with on this and feels that it would be reasonable therapy to consider.  04/01/2018  Christine Butler returns today for follow-up of her dyslipidemia.  She has been in continual follow-up with her hepatologist at [facility].  Most recently in November she was considered to be placed on the transplant list.  She has undergone a number of tests including a stress echocardiogram which was negative.  In the interim she was started on Praluent, which she is taking and tolerating well.  I am pleased to report that she has had a significant reduction in LDL cholesterol.  Her most recent labs as of March 29, 2018 indicate total cholesterol of 279, HDL of 15, LDL 231 (decreased from 331) and triglycerides 165.  So far this is not associated with any change in her liver enzymes, however given her end-stage liver disease, is likely that the low or normal liver enzymes indicate lack of liver synthetic function.  Overall, this is likely the most safe medication for her to use in the interim with her dyslipidemia.  Again I suspect that liver  transplant will likely improve her dyslipidemia significantly.  PMHx:  Past Medical History:  Diagnosis Date   (HFpEF) heart failure with preserved ejection fraction ([place]) 08/09/2019   Acute blood loss anemia 04/20/2017   Altered mental status 08/14/2019   Arthritis    right knee   Ascites--mild this admit 11/06/2017   Atypical chest pain 11/05/2017   Atypical squamous cells of undetermined significance (ASCUS)  on Papanicolaou smear of cervix 08/05/2018   CAD (coronary artery disease)    a. s/p CABGx2 in 02/2017 (LAD not suitable for PCI), EF normal.   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   Chronic low back pain    Cirrhosis ([place]) 03/16/2017   Coronary artery disease 03/24/2017   COVID-19 09/18/2020   Current episode of major depressive disorder without prior episode 03/13/2020   Dyslipidemia, goal LDL below 70 10/13/2017   Elevated LFTs 04/20/2017   Encephalopathy 09/29/2019   hepatic encephalopathy   Familial hyperlipidemia    GERD (gastroesophageal reflux disease)    GI bleed 02/01/2018   H/O LEEP 03/30/2019   H/O two vessel coronary artery bypass graft 03/30/2019   High grade squamous intraepithelial lesion (HGSIL) on cytologic smear of cervix 05/04/2019   History of gastrostomy, has currently [facility]) 07/12/2020   Formatting of this note might be different from the original. Patient underwent ERCP 07/11/20. Procedure c/b hepaticogastrostomy stent placement.   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 IMOUPDATE   Primary biliary cirrhosis ([place])    cirhosis/liver disease followed by transplant team led by Dr Manuella Ghazi at [place]    S/P CABG (coronary artery bypass graft)    S/P CABG x 2 03/24/2017   LIMA to DIAGONAL Portion of SVG/LEFT RADIAL to LAD   S/P LEEP (loop electrosurgical excision procedure) 05/11/2019    S/P TAH-BSO 05/13/2021   Status post liver transplantation ([place]) 03/30/2019   SVD (spontaneous vaginal delivery)    x 3   Upper GI bleed 02/01/2018   Urinary incontinence    occ   Urinary tract bacterial infections last oct 2021   Wears glasses     Past Surgical History:  Procedure Laterality Date   CERVICAL CONIZATION W/BX N/A 03/22/2020   Procedure: CONIZATION CERVIX WITH BIOPSY;  Surgeon: Dian Queen, MD;  Location: [place];  Service: Gynecology;  Laterality: N/A;   CHOLECYSTECTOMY  09/2019   COLPOSCOPY N/A 03/22/2020   Procedure: COLPOSCOPY;  Surgeon: Dian Queen, MD;  Location: [facility];  Service: Gynecology;  Laterality: N/A;   CORONARY ARTERY BYPASS GRAFT N/A 03/24/2017   Procedure: CORONARY ARTERY BYPASS GRAFTING (CABG) x two, using left internal mammary artery, left radial artery, and right leg greater saphenous vein harvested endoscopically;  Surgeon: Ivin Poot, MD;  Location: [place];  Service: Open Heart Surgery;  Laterality: N/A;   ENDOVEIN HARVEST OF GREATER SAPHENOUS  VEIN Right 03/24/2017   Procedure: ENDOVEIN HARVEST OF GREATER SAPHENOUS VEIN;  Surgeon: Ivin Poot, MD;  Location: [facility] [facility];  Service: Open Heart Surgery;  Laterality: Right;   ESOPHAGEAL BANDING  02/01/2018   Procedure: ESOPHAGEAL BANDING;  Surgeon: [name], MD;  Location: [facility];  Service: Gastroenterology;;   ESOPHAGEAL BANDING N/A 03/29/2018   Procedure: ESOPHAGEAL BANDING;  Surgeon: [name], MD;  Location: [facility];  Service: Gastroenterology;  Laterality: N/A;   ESOPHAGEAL BANDING N/A 05/21/2018   Procedure: ESOPHAGEAL BANDING;  Surgeon: [name], MD;  Location: [facility];  Service: Gastroenterology;  Laterality: N/A;   ESOPHAGEAL BANDING N/A 10/11/2018   Procedure: ESOPHAGEAL BANDING;  Surgeon: [name], MD;  Location: [facility];  Service: Gastroenterology;  Laterality: N/A;   ESOPHAGOGASTRODUODENOSCOPY N/A 03/29/2018   Procedure:  ESOPHAGOGASTRODUODENOSCOPY (EGD);  Surgeon: [name], MD;  Location: [facility];  Service: Gastroenterology;  Laterality: N/A;   ESOPHAGOGASTRODUODENOSCOPY N/A 05/21/2018   Procedure: ESOPHAGOGASTRODUODENOSCOPY (EGD);  Surgeon: [name], MD;  Location: [facility];  Service: Gastroenterology;  Laterality: N/A;   ESOPHAGOGASTRODUODENOSCOPY (EGD) WITH PROPOFOL N/A 02/01/2018   Procedure: ESOPHAGOGASTRODUODENOSCOPY (EGD) WITH PROPOFOL;  Surgeon: [name], MD;  Location: [facility];  Service: Gastroenterology;  Laterality: N/A;   ESOPHAGOGASTRODUODENOSCOPY (EGD) WITH PROPOFOL N/A 10/11/2018   Procedure: ESOPHAGOGASTRODUODENOSCOPY (EGD) WITH PROPOFOL;  Surgeon: [name], MD;  Location: [facility];  Service: Gastroenterology;  Laterality: N/A;   HYSTERECTOMY ABDOMINAL WITH SALPINGO-OOPHORECTOMY Bilateral 05/13/2021   Procedure: TOTAL ABDOMINAL HYSTERECTOMY, BILATERAL SALPINGO OPPHORECTOMY;  Surgeon: Dian Queen, MD;  Location: [place];  Service: Gynecology;  Laterality: Bilateral;   INCONTINENCE SURGERY  2009   urinary  bladder sling   IR IMAGING GUIDED PORT INSERTION  04/08/2019   LEEP N/A 03/28/2014   Procedure: LOOP ELECTROSURGICAL EXCISION PROCEDURE (LEEP) cone biopsy;  Surgeon: Cyril Mourning, MD;  Location: [facility];  Service: Gynecology;  Laterality: N/A;   LEEP N/A 03/22/2020   Procedure: LOOP ELECTROSURGICAL EXCISION PROCEDURE (LEEP);  Surgeon: Dian Queen, MD;  Location: [facility];  Service: Gynecology;  Laterality: N/A;   LEFT HEART CATH AND CORONARY ANGIOGRAPHY N/A 03/23/2017   Procedure: LEFT HEART CATH AND CORONARY ANGIOGRAPHY;  Surgeon: Jettie Booze, MD;  Location: [place] [facility];  Service: Cardiovascular;  Laterality: N/A;   LIVER BIOPSY     x 2   LIVER TRANSPLANT  08/ 24/2021   RADIAL ARTERY HARVEST Left 03/24/2017   Procedure: RADIAL ARTERY HARVEST;  Surgeon: Ivin Poot, MD;  Location: [place];  Service: Open Heart Surgery;   Laterality: Left;   TEE WITHOUT CARDIOVERSION N/A 03/24/2017   Procedure: TRANSESOPHAGEAL ECHOCARDIOGRAM (TEE);  Surgeon: Prescott Gum, Collier Salina, MD;  Location: [place];  Service: Open Heart Surgery;  Laterality: N/A;   WISDOM TOOTH EXTRACTION  yrs ago    FAMHx:  Family History  Problem Relation Age of Onset   CAD Father    Diabetes Mellitus II Father    Heart disease Father    Heart attack Brother    Heart disease Maternal Aunt    Heart attack Paternal Grandmother    Heart attack Paternal Grandfather     SOCHx:   reports that she has never smoked. She has never used smokeless tobacco. She reports that she does not currently use alcohol after a past usage of about 1.0 standard drink of alcohol per week. She reports that she does not use drugs.  ALLERGIES:  Allergies  Allergen Reactions   Codeine Nausea And Vomiting  Has taken this medication since liver transplant without excessive nausea and vomiting   Erythromycin Nausea And Vomiting   Fentanyl Nausea And Vomiting    Has taken this medication since liver transplant without excessive nausea and vomiting    ROS: Pertinent items noted in HPI and remainder of comprehensive ROS otherwise negative.  HOME MEDS: Current Outpatient Medications on File Prior to Visit  Medication Sig Dispense Refill   acetaminophen (TYLENOL) 500 MG tablet Take 1,000 mg by mouth every 8 (eight) hours as needed for mild pain.     ASPIRIN LOW DOSE 81 MG EC tablet Take 81 mg by mouth daily.     Biotin 5 MG CAPS Take 5 mg by mouth daily in the afternoon.     Calcium Citrate (CITRACAL PO) Take 1,200 mg by mouth daily.     carvedilol (COREG) 25 MG tablet Take 1/2 tablet (12.5 mg) by mouth 2 times daily with a meal. 90 tablet 3   cycloSPORINE (RESTASIS) 0.05 % ophthalmic emulsion Place 1 drop into both eyes at bedtime.      ENVARSUS XR 4 MG TB24 Take 1 tablet (4 mg total) by mouth in the morning. 180 tablet 3   estradiol (VIVELLE-DOT) 0.05 MG/24HR patch Apply 1  patch twice a week 24 patch 3   finasteride (PROSCAR) 5 MG tablet Take 1 tablet (5 mg total) by mouth daily. (Patient taking differently: Take 2.5 mg by mouth every evening.) 90 tablet 3   fluticasone (FLONASE) 50 MCG/ACT nasal spray Place 1 spray into both nostrils daily as needed for allergies or rhinitis.      metroNIDAZOLE (METROCREAM) 0.75 % cream Apply 1 application externally to affected area 2 times a day 45 g 2   Multiple Vitamins-Minerals (DEKAS PLUS) CAPS Take 1 tablet by mouth daily.     nitroGLYCERIN (NITROSTAT) 0.4 MG SL tablet Place 0.4 mg under the tongue every 5 (five) minutes as needed for chest pain.     ursodiol (ACTIGALL) 300 MG capsule Take 1 capsule by mouth 3 times a day. 270 capsule 3   No current facility-administered medications on file prior to visit.    LABS/IMAGING: No results found for this or any previous visit (from the past 48 hour(s)). No results found.  LIPID PANEL:    Component Value Date/Time   CHOL 146 11/18/2021 0909   TRIG 91 11/18/2021 0909   HDL 43 11/18/2021 0909   CHOLHDL 3.4 11/18/2021 0909   CHOLHDL NOT CALCULATED 12/18/2017 0720   VLDL 38 12/18/2017 0720   LDLCALC 86 11/18/2021 0909   LDLDIRECT 229 (H) 12/30/2017 0856    WEIGHTS: Wt Readings from Last 3 Encounters:  12/11/21 178 lb 9.6 oz (81 kg)  11/18/21 177 lb 6.4 oz (80.5 kg)  09/11/21 174 lb 12.8 oz (79.3 kg)    VITALS: BP (!) 142/88   Pulse 67   Ht 5' 1.5" (1.562 m)   Wt 178 lb 9.6 oz (81 kg)   LMP 06/24/2017   SpO2 99%   BMI 33.20 kg/m   EXAM: General appearance: alert, icteric and no distress Neck: no carotid bruit, no JVD and thyroid not enlarged, symmetric, no tenderness/mass/nodules Lungs: clear to auscultation bilaterally Heart: regular rate and rhythm Abdomen: soft, non-tender; bowel sounds normal; no masses,  no organomegaly Extremities: extremities normal, atraumatic, no cyanosis or edema Pulses: 2+ and symmetric Skin: jaundiced Neurologic: Grossly  normal Psych: Pleasant  EKG: Deferred  ASSESSMENT: History of LDL>190 - negative genetic testing for FH (2019) ASCVD  with recent two-vessel CABG (02/2017) Strong family history of premature coronary artery disease PBC, with cirrhosis (followed by Dr. Pauline Good at [facility]) - s/p liver transplant in 2021  PLAN: 1.   Ms. Butler has had some recent further increase in cholesterol, probably associated with weight gain although she reports a health diet and remains active.  I am less comfortable with her rising cholesterol and would at least like her LDL to be below 70.  There is of course some potential benefit for her transplant being on a statin.  After discussing with her further, she is agreeable to trying low-dose rosuvastatin 5 mg daily.  She gets regular liver enzymes and this will be monitored at [facility].  We will plan repeat lipids and check LP(a) given her early onset heart disease in 3 to 4 months.  Follow-up in 6 months.  Pixie Casino, MD, [facility], [place] Director of the Advanced Lipid Disorders &  Cardiovascular Risk Reduction Clinic Diplomate of the American Board of Clinical Lipidology Attending Cardiologist  Direct Dial: [phone]  Fax: [phone]  Website:  [email or web address] 12/11/2021, 1:55 PM

## 2021-12-11 NOTE — Patient Instructions (Signed)
Medication Instructions:  START rosuvastatin (crestor) '5mg'$  daily   *If you need a refill on your cardiac medications before your next appointment, please call your pharmacy*   Lab Work: FASTING lab work in 3 months to check cholesterol   If you have labs (blood work) drawn today and your tests are completely normal, you will receive your results only by: [place] (if you have MyChart) OR A paper copy in the mail If you have any lab test that is abnormal or we need to change your treatment, we will call you to review the results.   Follow-Up: At [facility], you and your health needs are our priority.  As part of our continuing mission to provide you with exceptional heart care, we have created designated Provider Care Teams.  These Care Teams include your primary Cardiologist (physician) and Advanced Practice Providers (APPs -  Physician Assistants and Nurse Practitioners) who all work together to provide you with the care you need, when you need it.  We recommend signing up for the patient portal called "MyChart".  Sign up information is provided on this After Visit Summary.  MyChart is used to connect with patients for Virtual Visits (Telemedicine).  Patients are able to view lab/test results, encounter notes, upcoming appointments, etc.  Non-urgent messages can be sent to your provider as well.   To learn more about what you can do with MyChart, go to [email or web address].    Your next appointment:    6 months with Dr. Debara Pickett

## 2021-12-12 ENCOUNTER — Encounter: Payer: Self-pay | Admitting: Endocrinology

## 2021-12-19 ENCOUNTER — Ambulatory Visit (INDEPENDENT_AMBULATORY_CARE_PROVIDER_SITE_OTHER): Payer: 59

## 2021-12-19 VITALS — BP 121/80 | HR 59 | Temp 97.6°F | Resp 18 | Ht 61.5 in | Wt 181.0 lb

## 2021-12-19 DIAGNOSIS — M818 Other osteoporosis without current pathological fracture: Secondary | ICD-10-CM

## 2021-12-19 MED ORDER — SODIUM CHLORIDE 0.9 % IV SOLN: INTRAVENOUS | Status: DC

## 2021-12-19 MED ORDER — ACETAMINOPHEN 325 MG PO TABS: 650.0000 mg | ORAL_TABLET | Freq: Once | ORAL | Status: DC

## 2021-12-19 MED ORDER — HEPARIN SOD (PORK) LOCK FLUSH 100 UNIT/ML IV SOLN
500.0000 [IU] | Freq: Once | INTRAVENOUS | Status: AC
  Administered 2021-12-19: 500 [IU]

## 2021-12-19 MED ORDER — HEPARIN SOD (PORK) LOCK FLUSH 100 UNIT/ML IV SOLN: 500.0000 [IU] | Freq: Once | INTRAVENOUS | Status: AC

## 2021-12-19 MED ORDER — ZOLEDRONIC ACID 5 MG/100ML IV SOLN
5.0000 mg | Freq: Once | INTRAVENOUS | Status: AC
  Administered 2021-12-19: 5 mg via INTRAVENOUS
  Filled 2021-12-19: qty 100

## 2021-12-19 NOTE — Progress Notes (Signed)
Diagnosis: Osteoporosis  Provider:  Marshell Garfinkel MD  Procedure: Infusion  IV Type: Port a Cath, IV Location: R Chest  Reclast (Zolendronic Acid), Dose: 5 mg  Infusion Start Time: 1528  Infusion Stop Time: 7564  Post Infusion IV Care: Port a Cath Deaccessed/Flushed and Heparin locked per protocol.Pt declined observation.  Discharge: Condition: Good, Destination: Home . AVS provided to patient.   Performed by:  Arnoldo Morale, RN

## 2021-12-30 DIAGNOSIS — Z79899 Other long term (current) drug therapy: Principal | ICD-10-CM

## 2021-12-30 DIAGNOSIS — Z944 Liver transplant status: Principal | ICD-10-CM

## 2021-12-31 ENCOUNTER — Other Ambulatory Visit (HOSPITAL_COMMUNITY): Payer: Self-pay

## 2022-01-02 ENCOUNTER — Ambulatory Visit: Payer: 59 | Attending: Cardiology | Admitting: Cardiology

## 2022-01-02 ENCOUNTER — Encounter: Payer: Self-pay | Admitting: Cardiology

## 2022-01-02 ENCOUNTER — Other Ambulatory Visit (HOSPITAL_COMMUNITY): Payer: Self-pay

## 2022-01-02 VITALS — BP 126/76 | HR 64 | Ht 61.6 in | Wt 178.1 lb

## 2022-01-02 DIAGNOSIS — I2510 Atherosclerotic heart disease of native coronary artery without angina pectoris: Secondary | ICD-10-CM | POA: Diagnosis not present

## 2022-01-02 DIAGNOSIS — R011 Cardiac murmur, unspecified: Secondary | ICD-10-CM

## 2022-01-02 DIAGNOSIS — Z951 Presence of aortocoronary bypass graft: Secondary | ICD-10-CM | POA: Diagnosis not present

## 2022-01-02 DIAGNOSIS — Z944 Liver transplant status: Secondary | ICD-10-CM | POA: Diagnosis not present

## 2022-01-02 DIAGNOSIS — E669 Obesity, unspecified: Secondary | ICD-10-CM | POA: Diagnosis not present

## 2022-01-02 DIAGNOSIS — E66811 Obesity, class 1: Secondary | ICD-10-CM | POA: Insufficient documentation

## 2022-01-02 DIAGNOSIS — I10 Essential (primary) hypertension: Secondary | ICD-10-CM | POA: Diagnosis not present

## 2022-01-02 DIAGNOSIS — E785 Hyperlipidemia, unspecified: Secondary | ICD-10-CM

## 2022-01-02 NOTE — Patient Instructions (Signed)
Medication Instructions:  Your physician recommends that you continue on your current medications as directed. Please refer to the Current Medication list given to you today.  *If you need a refill on your cardiac medications before your next appointment, please call your pharmacy*   Lab Work: None Ordered If you have labs (blood work) drawn today and your tests are completely normal, you will receive your results only by: [place] (if you have MyChart) OR A paper copy in the mail If you have any lab test that is abnormal or we need to change your treatment, we will call you to review the results.   Testing/Procedures: Your physician has requested that you have an echocardiogram. Echocardiography is a painless test that uses sound waves to create images of your heart. It provides your doctor with information about the size and shape of your heart and how well your heart's chambers and valves are working. This procedure takes approximately one hour. There are no restrictions for this procedure. Please do NOT wear cologne, perfume, aftershave, or lotions (deodorant is allowed). Please arrive 15 minutes prior to your appointment time.    Follow-Up: At [facility], you and your health needs are our priority.  As part of our continuing mission to provide you with exceptional heart care, we have created designated Provider Care Teams.  These Care Teams include your primary Cardiologist (physician) and Advanced Practice Providers (APPs -  Physician Assistants and Nurse Practitioners) who all work together to provide you with the care you need, when you need it.  We recommend signing up for the patient portal called "MyChart".  Sign up information is provided on this After Visit Summary.  MyChart is used to connect with patients for Virtual Visits (Telemedicine).  Patients are able to view lab/test results, encounter notes, upcoming appointments, etc.  Non-urgent messages can be sent to your  provider as well.   To learn more about what you can do with MyChart, go to [email or web address].    Your next appointment:   9 month(s)  The format for your next appointment:   In Person  Provider:   Jenne Campus, MD    Other Instructions NA

## 2022-01-02 NOTE — Progress Notes (Signed)
Cardiology Office Note:    Date:  01/02/2022   ID:  Christine Butler, DOB Jun 21, 1970, MRN [id number]  PCP:  Maude Leriche, PA-C (Inactive)  Cardiologist:  [name], MD   Referring MD: No ref. provider found    ASSESSMENT:    1. Coronary artery disease involving native coronary artery of native heart without angina pectoris   2. Primary hypertension   3. S/P CABG (coronary artery bypass graft)   4. Status post liver transplantation ([place])   5. Dyslipidemia, goal LDL below 70   6. Obesity (BMI 30.0-34.9)    PLAN:    In order of problems listed above:  Coronary artery disease post CABG surgery: Secondary prevention stressed with patient.  Importance of compliance with diet and medication stressed and she vocalized understanding.  She was advised to walk at least half an hour a day 5 days a week and she promises to do so. Essential hypertension: Blood pressure stable and diet was emphasized.  Lifestyle modification urged. Mixed dyslipidemia: On lipid-lowering medications followed by her lipid clinic.  She is doing well with diet. Obesity: Weight reduction stressed and diet was emphasized and she plans to do better.  She is in touch with her primary care provider about this. Echocardiogram will be done to assess murmur heard on auscultation Patient will be seen in follow-up appointment in 6 months or earlier if the patient has any concerns    Medication Adjustments/Labs and Tests Ordered: Current medicines are reviewed at length with the patient today.  Concerns regarding medicines are outlined above.  No orders of the defined types were placed in this encounter.  No orders of the defined types were placed in this encounter.    No chief complaint on file.    History of Present Illness:    Christine Butler is a 51 y.o. female.  Patient has past medical history of coronary artery disease post CABG surgery, essential hypertension, dyslipidemia, post liver transplantation.   She denies any problems at this time and takes care of activities of daily living.  No chest pain orthopnea or PND.  She ambulates and exercises on a regular basis.  At the time of my evaluation, the patient is alert awake oriented and in no distress.  She has some shortness of breath when she overexerts.  Past Medical History:  Diagnosis Date   (HFpEF) heart failure with preserved ejection fraction ([place]) 08/09/2019   Acute blood loss anemia 04/20/2017   Altered mental status 08/14/2019   Arthritis    right knee   Ascites--mild this admit 11/06/2017   Atypical chest pain 11/05/2017   Atypical squamous cells of undetermined significance (ASCUS) on Papanicolaou smear of cervix 08/05/2018   CAD (coronary artery disease)    a. s/p CABGx2 in 02/2017 (LAD not suitable for PCI), EF normal.   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   Chronic low back pain    Cirrhosis ([place]) 03/16/2017   Coronary artery disease 03/24/2017   COVID-19 09/18/2020   Current episode of major depressive disorder without prior episode 03/13/2020   Dyslipidemia, goal LDL below 70 10/13/2017   Elevated LFTs 04/20/2017   Encephalopathy 09/29/2019   hepatic encephalopathy   Familial hyperlipidemia    GERD (gastroesophageal reflux disease)    GI bleed 02/01/2018   H/O LEEP 03/30/2019   H/O two vessel coronary artery bypass graft 03/30/2019  High grade squamous intraepithelial lesion (HGSIL) on cytologic smear of cervix 05/04/2019   History of gastrostomy, has currently [facility]) 07/12/2020   Formatting of this note might be different from the original. Patient underwent ERCP 07/11/20. Procedure c/b hepaticogastrostomy stent placement.   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 IMOUPDATE   Primary biliary cirrhosis ([place])    cirhosis/liver disease followed by transplant team led by Dr Manuella Ghazi at [place]    S/P CABG (coronary artery bypass graft)    S/P CABG x 2 03/24/2017   LIMA to DIAGONAL Portion of SVG/LEFT RADIAL to LAD   S/P LEEP (loop electrosurgical excision procedure) 05/11/2019   S/P TAH-BSO 05/13/2021   Secondary generalized osteoporosis 11/19/2021   Status post liver transplantation ([place]) 03/30/2019   SVD (spontaneous vaginal delivery)    x 3   Upper GI bleed 02/01/2018   Urinary incontinence    occ   Urinary tract bacterial infections last oct 2021   Wears glasses     Past Surgical History:  Procedure Laterality Date   CERVICAL CONIZATION W/BX N/A 03/22/2020   Procedure: CONIZATION CERVIX WITH BIOPSY;  Surgeon: Dian Queen, MD;  Location: [place];  Service: Gynecology;  Laterality: N/A;   CHOLECYSTECTOMY  09/2019   COLPOSCOPY N/A 03/22/2020   Procedure: COLPOSCOPY;  Surgeon: Dian Queen, MD;  Location[facility];  Service: Gynecology;  Laterality: N/A;   CORONARY ARTERY BYPASS GRAFT N/A 03/24/2017   Procedure: CORONARY ARTERY BYPASS GRAFTING (CABG) x two, using left internal mammary artery, left radial artery, and right leg greater saphenous vein harvested endoscopically;  Surgeon: Ivin Poot, MD;  Location: [place];  Service: Open Heart Surgery;  Laterality: N/A;   ENDOVEIN HARVEST OF GREATER SAPHENOUS VEIN Right 03/24/2017   Procedure: ENDOVEIN HARVEST OF GREATER SAPHENOUS VEIN;  Surgeon: Ivin Poot, MD;  Location: [place];  Service: Open Heart Surgery;  Laterality:  Right;   ESOPHAGEAL BANDING  02/01/2018   Procedure: ESOPHAGEAL BANDING;  Surgeon: [name], MD;  Location: [facility];  Service: Gastroenterology;;   ESOPHAGEAL BANDING N/A 03/29/2018   Procedure: ESOPHAGEAL BANDING;  Surgeon: [name], MD;  Location: [facility];  Service: Gastroenterology;  Laterality: N/A;   ESOPHAGEAL BANDING N/A 05/21/2018   Procedure: ESOPHAGEAL BANDING;  Surgeon: [name], MD;  Location: [facility];  Service: Gastroenterology;  Laterality: N/A;   ESOPHAGEAL BANDING N/A 10/11/2018   Procedure: ESOPHAGEAL BANDING;  Surgeon: [name], MD;  Location: [facility];  Service: Gastroenterology;  Laterality: N/A;   ESOPHAGOGASTRODUODENOSCOPY N/A 03/29/2018   Procedure: ESOPHAGOGASTRODUODENOSCOPY (EGD);  Surgeon: [name], MD;  Location: [facility];  Service: Gastroenterology;  Laterality: N/A;   ESOPHAGOGASTRODUODENOSCOPY N/A 05/21/2018   Procedure: ESOPHAGOGASTRODUODENOSCOPY (EGD);  Surgeon: [name], MD;  Location: [facility];  Service: Gastroenterology;  Laterality: N/A;   ESOPHAGOGASTRODUODENOSCOPY (EGD) WITH PROPOFOL N/A 02/01/2018   Procedure: ESOPHAGOGASTRODUODENOSCOPY (EGD) WITH PROPOFOL;  Surgeon: [name], MD;  Location: [facility];  Service: Gastroenterology;  Laterality: N/A;   ESOPHAGOGASTRODUODENOSCOPY (EGD) WITH PROPOFOL N/A 10/11/2018   Procedure: ESOPHAGOGASTRODUODENOSCOPY (EGD) WITH PROPOFOL;  Surgeon: [name], MD;  Location: [facility];  Service: Gastroenterology;  Laterality: N/A;   HYSTERECTOMY ABDOMINAL WITH SALPINGO-OOPHORECTOMY Bilateral 05/13/2021   Procedure: TOTAL ABDOMINAL HYSTERECTOMY, BILATERAL SALPINGO OPPHORECTOMY;  Surgeon: Dian Queen, MD;  Location: [place];  Service: Gynecology;  Laterality: Bilateral;   INCONTINENCE SURGERY  2009   urinary  bladder sling   IR IMAGING GUIDED PORT INSERTION  04/08/2019   LEEP N/A 03/28/2014   Procedure: LOOP ELECTROSURGICAL EXCISION PROCEDURE (LEEP) cone biopsy;  Surgeon: Cyril Mourning, MD;  Location: [facility];  Service: Gynecology;  Laterality: N/A;   LEEP N/A 03/22/2020   Procedure: LOOP ELECTROSURGICAL EXCISION PROCEDURE (LEEP);  Surgeon: Dian Queen, MD;  Location[facility];  Service: Gynecology;  Laterality: N/A;   LEFT HEART CATH AND CORONARY ANGIOGRAPHY N/A 03/23/2017   Procedure: LEFT HEART CATH AND CORONARY ANGIOGRAPHY;  Surgeon: Jettie Booze, MD;  Location: [place] [facility];  Service: Cardiovascular;  Laterality: N/A;   LIVER BIOPSY     x 2   LIVER TRANSPLANT  08/ 24/2021   RADIAL ARTERY HARVEST Left 03/24/2017   Procedure: RADIAL ARTERY HARVEST;  Surgeon: Ivin Poot, MD;  Location: [place];  Service: Open Heart Surgery;  Laterality: Left;   TEE WITHOUT CARDIOVERSION N/A 03/24/2017   Procedure: TRANSESOPHAGEAL ECHOCARDIOGRAM (TEE);  Surgeon: Prescott Gum, Collier Salina, MD;  Location: [place];  Service: Open Heart Surgery;  Laterality: N/A;   WISDOM TOOTH EXTRACTION  yrs ago    Current Medications: Current Meds  Medication Sig   acetaminophen (TYLENOL) 500 MG tablet Take 1,000 mg by mouth every 8 (eight) hours as needed for mild pain.   ASPIRIN LOW DOSE 81 MG EC tablet Take 81 mg by mouth daily.   Biotin 5 MG CAPS Take 5 mg by mouth daily in the afternoon.   Calcium Citrate (CITRACAL PO) Take 1,200 mg by mouth daily.   carvedilol (COREG) 25 MG tablet Take 1/2 tablet (12.5 mg) by mouth 2 times daily with a meal.   cycloSPORINE (RESTASIS) 0.05 % ophthalmic emulsion Place 1 drop into both eyes at bedtime.    ENVARSUS XR 4 MG TB24 Take 1 tablet (4 mg total) by mouth in the morning.   estradiol (VIVELLE-DOT) 0.05 MG/24HR patch Apply 1 patch twice a week   finasteride (PROSCAR) 5 MG tablet Take 1 tablet (5 mg total) by mouth daily. (Patient taking differently: Take 2.5 mg by mouth every evening.)   fluticasone (FLONASE) 50 MCG/ACT nasal spray Place 1 spray into both nostrils daily as needed for allergies or rhinitis.    nitroGLYCERIN  (NITROSTAT) 0.4 MG SL tablet Place 0.4 mg under the tongue every 5 (five) minutes as needed for chest pain.   rosuvastatin (CRESTOR) 5 MG tablet Take 1 tablet (5 mg total) by mouth daily.   ursodiol (ACTIGALL) 300 MG capsule Take 1 capsule by mouth 3 times a day.   Current Facility-Administered Medications for the 01/02/22 encounter (Office Visit) with Lennox Leikam, Reita Cliche, MD  Medication   heparin lock flush 100 unit/mL     Allergies:   Codeine, Erythromycin, and Fentanyl   Social History   Socioeconomic History   Marital status: Divorced    Spouse name: Not on file   Number of children: 3   Years of education: 16   Highest education level: Associate degree: academic program  Occupational History  Not on file  Tobacco Use   Smoking status: Never   Smokeless tobacco: Never  Vaping Use   Vaping Use: Never used  Substance and Sexual Activity   Alcohol use: Not Currently    Alcohol/week: 1.0 standard drink of alcohol    Types: 1 Glasses of wine per week   Drug use: No   Sexual activity: Yes    Partners: Male    Birth control/protection: None  Other Topics Concern   Not on file  Social History Narrative   Not on file   Social Determinants of Health   Financial Resource Strain: Low Risk  (03/23/2017)   Overall Financial Resource Strain (CARDIA)    Difficulty of Paying Living Expenses: Not hard at all  Food Insecurity: No Food Insecurity (03/23/2017)   Hunger Vital Sign    Worried About Running Out of Food in the Last Year: Never true    Ran Out of Food in the Last Year: Never true  Transportation Needs: No Transportation Needs (03/23/2017)   PRAPARE - Hydrologist (Medical): No    Lack of Transportation (Non-Medical): No  Physical Activity: Inactive (03/23/2017)   Exercise Vital Sign    Days of Exercise per Week: 0 days    Minutes of Exercise per Session: 0 min  Stress: No Stress Concern Present (03/23/2017)   [place]    Feeling of Stress : Not at all  Social Connections: Moderately Integrated (03/23/2017)   Social Connection and Isolation Panel [NHANES]    Frequency of Communication with Friends and Family: More than three times a week    Frequency of Social Gatherings with Friends and Family: More than three times a week    Attends Religious Services: More than 4 times per year    Active Member of [employer] or Organizations: Yes    Attends Music therapist: More than 4 times per year    Marital Status: Divorced     Family History: The patient's family history includes CAD in her father; Diabetes Mellitus II in her father; Heart attack in her brother, paternal grandfather, and paternal grandmother; Heart disease in her father and maternal aunt.  ROS:   Please see the history of present illness.    All other systems reviewed and are negative.  EKGs/Labs/Other Studies Reviewed:    The following studies were reviewed today: EKG reveals sinus rhythm and nonspecific ST-T changes   Recent Labs: 05/07/2021: Magnesium 1.7 09/04/2021: ALT 12; B Natriuretic Peptide 188.0; Hemoglobin 11.1; Platelets 157 11/18/2021: BUN 32; Creatinine, Ser 1.48; Potassium 5.2; Sodium 136; TSH 2.02  Recent Lipid Panel    Component Value Date/Time   CHOL 146 11/18/2021 0909   TRIG 91 11/18/2021 0909   HDL 43 11/18/2021 0909   CHOLHDL 3.4 11/18/2021 0909   CHOLHDL NOT CALCULATED 12/18/2017 0720   VLDL 38 12/18/2017 0720   LDLCALC 86 11/18/2021 0909   LDLDIRECT 229 (H) 12/30/2017 0856    Physical Exam:    VS:  BP 126/76   Pulse 64   Ht 5' 1.6" (1.565 m)   Wt 178 lb 1.9 oz (80.8 kg)   LMP 06/24/2017   SpO2 99%   BMI 33.00 kg/m     Wt Readings from Last 3 Encounters:  01/02/22 178 lb 1.9 oz (80.8 kg)  12/19/21 181 lb (82.1 kg)  12/11/21 178 lb 9.6 oz (81 kg)     GEN: Patient is in  no acute distress HEENT: Normal NECK: No JVD; No carotid bruits LYMPHATICS:  No lymphadenopathy CARDIAC: Hear sounds regular, 2/6 systolic murmur at the apex. RESPIRATORY:  Clear to auscultation without rales, wheezing or rhonchi  ABDOMEN: Soft, non-tender, non-distended MUSCULOSKELETAL:  No edema; No deformity  SKIN: Warm and dry NEUROLOGIC:  Alert and oriented x 3 PSYCHIATRIC:  Normal affect   Signed, [name], MD  01/02/2022 3:49 PM    [facility]

## 2022-01-03 ENCOUNTER — Other Ambulatory Visit: Payer: Self-pay | Admitting: Pharmacy Technician

## 2022-01-03 ENCOUNTER — Other Ambulatory Visit (HOSPITAL_COMMUNITY): Payer: Self-pay

## 2022-01-06 ENCOUNTER — Other Ambulatory Visit (HOSPITAL_COMMUNITY): Payer: Self-pay

## 2022-01-08 ENCOUNTER — Emergency Department (HOSPITAL_BASED_OUTPATIENT_CLINIC_OR_DEPARTMENT_OTHER)
Admission: EM | Admit: 2022-01-08 | Discharge: 2022-01-08 | Disposition: A | Discharge status: Home / Self Care | Payer: 59 | Attending: Emergency Medicine | Admitting: Emergency Medicine

## 2022-01-08 ENCOUNTER — Encounter (HOSPITAL_BASED_OUTPATIENT_CLINIC_OR_DEPARTMENT_OTHER): Payer: Self-pay

## 2022-01-08 ENCOUNTER — Other Ambulatory Visit (HOSPITAL_COMMUNITY): Payer: Self-pay

## 2022-01-08 ENCOUNTER — Other Ambulatory Visit: Payer: Self-pay

## 2022-01-08 DIAGNOSIS — R112 Nausea with vomiting, unspecified: Secondary | ICD-10-CM | POA: Insufficient documentation

## 2022-01-08 DIAGNOSIS — R7989 Other specified abnormal findings of blood chemistry: Secondary | ICD-10-CM | POA: Diagnosis not present

## 2022-01-08 DIAGNOSIS — Z7982 Long term (current) use of aspirin: Secondary | ICD-10-CM | POA: Insufficient documentation

## 2022-01-08 DIAGNOSIS — R9431 Abnormal electrocardiogram [ECG] [EKG]: Secondary | ICD-10-CM | POA: Diagnosis not present

## 2022-01-08 DIAGNOSIS — D649 Anemia, unspecified: Secondary | ICD-10-CM | POA: Diagnosis not present

## 2022-01-08 DIAGNOSIS — R1110 Vomiting, unspecified: Secondary | ICD-10-CM | POA: Diagnosis not present

## 2022-01-08 DIAGNOSIS — R1013 Epigastric pain: Secondary | ICD-10-CM | POA: Insufficient documentation

## 2022-01-08 LAB — CBC
HCT: 27 % — ABNORMAL LOW (ref 36.0–46.0)
Hemoglobin: 9.6 g/dL — ABNORMAL LOW (ref 12.0–15.0)
MCH: 32.2 pg (ref 26.0–34.0)
MCHC: 35.6 g/dL (ref 30.0–36.0)
MCV: 90.6 fL (ref 80.0–100.0)
Platelets: 110 10*3/uL — ABNORMAL LOW (ref 150–400)
RBC: 2.98 MIL/uL — ABNORMAL LOW (ref 3.87–5.11)
RDW: 11.9 % (ref 11.5–15.5)
WBC: 4.6 10*3/uL (ref 4.0–10.5)
nRBC: 0 % (ref 0.0–0.2)

## 2022-01-08 LAB — URINALYSIS, ROUTINE W REFLEX MICROSCOPIC
Bilirubin Urine: NEGATIVE
Glucose, UA: NEGATIVE mg/dL
Hgb urine dipstick: NEGATIVE
Ketones, ur: NEGATIVE mg/dL
Leukocytes,Ua: NEGATIVE
Nitrite: NEGATIVE
Specific Gravity, Urine: 1.017 (ref 1.005–1.030)
pH: 5 (ref 5.0–8.0)

## 2022-01-08 LAB — COMPREHENSIVE METABOLIC PANEL
ALT: 8 U/L (ref 0–44)
AST: 12 U/L — ABNORMAL LOW (ref 15–41)
Albumin: 4.1 g/dL (ref 3.5–5.0)
Alkaline Phosphatase: 55 U/L (ref 38–126)
Anion gap: 8 (ref 5–15)
BUN: 46 mg/dL — ABNORMAL HIGH (ref 6–20)
CO2: 19 mmol/L — ABNORMAL LOW (ref 22–32)
Calcium: 8.4 mg/dL — ABNORMAL LOW (ref 8.9–10.3)
Chloride: 110 mmol/L (ref 98–111)
Creatinine, Ser: 1.89 mg/dL — ABNORMAL HIGH (ref 0.44–1.00)
GFR, Estimated: 32 mL/min — ABNORMAL LOW (ref >60)
Glucose, Bld: 92 mg/dL (ref 70–99)
Potassium: 4.9 mmol/L (ref 3.5–5.1)
Sodium: 137 mmol/L (ref 135–145)
Total Bilirubin: 0.6 mg/dL (ref 0.3–1.2)
Total Protein: 6.7 g/dL (ref 6.5–8.1)

## 2022-01-08 LAB — LIPASE, BLOOD: Lipase: 37 U/L (ref 11–51)

## 2022-01-08 MED ORDER — PANTOPRAZOLE SODIUM 40 MG IV SOLR
40.0000 mg | Freq: Once | INTRAVENOUS | Status: AC
  Administered 2022-01-08: 40 mg via INTRAVENOUS
  Filled 2022-01-08: qty 10

## 2022-01-08 MED ORDER — SODIUM CHLORIDE 0.9 % IV BOLUS
1000.0000 mL | Freq: Once | INTRAVENOUS | Status: AC
  Administered 2022-01-08: 1000 mL via INTRAVENOUS

## 2022-01-08 MED ORDER — HEPARIN SOD (PORK) LOCK FLUSH 100 UNIT/ML IV SOLN
INTRAVENOUS | Status: AC
  Administered 2022-01-08: 100 [IU]
  Filled 2022-01-08: qty 5

## 2022-01-08 MED ORDER — METOCLOPRAMIDE HCL 10 MG PO TABS
5.0000 mg | ORAL_TABLET | Freq: Three times a day (TID) | ORAL | 0 refills | Status: DC
  Filled 2022-01-08: qty 30, 20d supply, fill #0

## 2022-01-08 MED ORDER — PANTOPRAZOLE SODIUM 20 MG PO TBEC
20.0000 mg | DELAYED_RELEASE_TABLET | Freq: Every day | ORAL | 0 refills | Status: DC
  Filled 2022-01-08: qty 30, 30d supply, fill #0

## 2022-01-08 NOTE — ED Notes (Signed)
Patient has a port in her right upper chest.  She has poor venous access.  Will inquire on getting approval to access her port for labs and fluids/meds.  She is in agreement with this plan.  She is noted to be pale in color.  Alert and oriented.  She has had multiple surgeries to her abdomen.  She is a liver transplant patient.

## 2022-01-08 NOTE — Discharge Instructions (Addendum)
You were seen in the ER with symptoms consistent with gastroparesis and reflux.  I am sending you home on pantoprazole for reflux and metoclopramide for possible gastroparesis.  Metoclopramide is also good for nausea.  I encourage you to eat small meals that are low in fat and fiber with increased fluid intake to maintain hydration.  Please follow-up with your PCP or GI doctor as soon as possible for reevaluation.  Please keep all scheduled appointments with your specialists.  Return to ER if you develop new or worsening symptoms.

## 2022-01-08 NOTE — ED Provider Notes (Signed)
[facility] DEPT Provider Note   CSN: [id number] Arrival date & time: 01/08/22  1158     History  Chief Complaint  Patient presents with   Abdominal Pain   Nausea   Emesis    Christine Butler is a 51 y.o. female presents to the ER complaining of intermittent epigastric pain with nausea and vomiting for the past month.  She states it has been worsening and she endorses a fullness after eating small meals.  She states she has had symptoms like this in the past, but this is the worst it has been.  She reports vomiting undigested food hours after eating or even the next morning.  She ran out of her prescription omeprazole but has been using OTC omeprazole without relief of his symptoms.  Patient has history significant for liver transplant in 2021, GERD, history of gastrostomy.  She also reports she has chronic diarrhea and that has not changed.  Denies fever, chills, shortness of breath, chest pain, weakness, syncope.       Home Medications Prior to Admission medications   Medication Sig Start Date End Date Taking? Authorizing Provider  metoCLOPramide (REGLAN) 10 MG tablet Take 0.5 tablets (5 mg total) by mouth 3 (three) times daily before meals. 01/08/22  Yes George Haggart R, PA  pantoprazole (PROTONIX) 20 MG tablet Take 1 tablet (20 mg total) by mouth daily. 01/08/22  Yes Jennings Stirling R, PA  acetaminophen (TYLENOL) 500 MG tablet Take 1,000 mg by mouth every 8 (eight) hours as needed for mild pain.    [provider]  ASPIRIN LOW DOSE 81 MG EC tablet Take 81 mg by mouth daily. 11/23/19   [provider]  Biotin 5 MG CAPS Take 5 mg by mouth daily in the afternoon.    [provider]  Calcium Citrate (CITRACAL PO) Take 1,200 mg by mouth daily.    [provider]  carvedilol (COREG) 25 MG tablet Take 1/2 tablet (12.5 mg) by mouth 2 times daily with a meal. 01/15/21   Revankar, Reita Cliche, MD  cycloSPORINE (RESTASIS) 0.05 % ophthalmic  emulsion Place 1 drop into both eyes at bedtime.  11/24/18   [provider]  ENVARSUS XR 4 MG TB24 Take 1 tablet (4 mg total) by mouth in the morning. 03/08/21     estradiol (VIVELLE-DOT) 0.05 MG/24HR patch Apply 1 patch twice a week 06/20/21     finasteride (PROSCAR) 5 MG tablet Take 1 tablet (5 mg total) by mouth daily. Patient taking differently: Take 2.5 mg by mouth every evening. 04/19/21     fluticasone (FLONASE) 50 MCG/ACT nasal spray Place 1 spray into both nostrils daily as needed for allergies or rhinitis.     [provider]  metroNIDAZOLE (METROCREAM) 0.75 % cream Apply 1 application externally to affected area 2 times a day Patient not taking: Reported on 01/02/2022 06/11/21     nitroGLYCERIN (NITROSTAT) 0.4 MG SL tablet Place 0.4 mg under the tongue every 5 (five) minutes as needed for chest pain.    [provider]  rosuvastatin (CRESTOR) 5 MG tablet Take 1 tablet (5 mg total) by mouth daily. 12/11/21 12/06/22  Pixie Casino, MD  ursodiol (ACTIGALL) 300 MG capsule Take 1 capsule by mouth 3 times a day. 04/17/21         Allergies    Codeine, Erythromycin, and Fentanyl    Review of Systems   Review of Systems  Constitutional:  Positive for appetite change. Negative for  chills and fever.  Respiratory:  Negative for shortness of breath.   Cardiovascular:  Negative for chest pain.  Gastrointestinal:  Positive for abdominal pain, diarrhea, nausea and vomiting. Negative for blood in stool.  Genitourinary:  Negative for dysuria.  Neurological:  Negative for syncope, weakness and light-headedness.    Physical Exam Updated Vital Signs BP 118/64   Pulse 68   Temp 98 F (36.7 C)   Resp 14   Ht 5' 1.5" (1.562 m)   Wt 80.3 kg   LMP 06/24/2017   SpO2 100%   BMI 32.90 kg/m  Physical Exam Vitals and nursing note reviewed.  Constitutional:      General: She is not in acute distress.    Appearance: She is not ill-appearing.  HENT:     Mouth/Throat:      Mouth: Mucous membranes are moist.     Pharynx: Oropharynx is clear.  Cardiovascular:     Rate and Rhythm: Normal rate and regular rhythm.     Pulses: Normal pulses.     Heart sounds: Normal heart sounds.  Pulmonary:     Effort: Pulmonary effort is normal. No respiratory distress.     Breath sounds: Normal breath sounds and air entry.  Abdominal:     General: Abdomen is flat. Bowel sounds are normal. There is no distension.     Palpations: Abdomen is soft.     Tenderness: There is abdominal tenderness in the epigastric area.  Skin:    General: Skin is warm and dry.     Capillary Refill: Capillary refill takes less than 2 seconds.  Neurological:     Mental Status: She is alert. Mental status is at baseline.  Psychiatric:        Mood and Affect: Mood normal.        Behavior: Behavior normal.     ED Results / Procedures / Treatments   Labs (all labs ordered are listed, but only abnormal results are displayed) Labs Reviewed  COMPREHENSIVE METABOLIC PANEL - Abnormal; Notable for the following components:      Result Value   CO2 19 (*)    BUN 46 (*)    Creatinine, Ser 1.89 (*)    Calcium 8.4 (*)    AST 12 (*)    GFR, Estimated 32 (*)    All other components within normal limits  CBC - Abnormal; Notable for the following components:   RBC 2.98 (*)    Hemoglobin 9.6 (*)    HCT 27.0 (*)    Platelets 110 (*)    All other components within normal limits  URINALYSIS, ROUTINE W REFLEX MICROSCOPIC - Abnormal; Notable for the following components:   Protein, ur TRACE (*)    All other components within normal limits  LIPASE, BLOOD    EKG EKG Interpretation  Date/Time:  Wednesday January 08 2022 12:34:53 EST Ventricular Rate:  66 PR Interval:  122 QRS Duration: 124 QT Interval:  436 QTC Calculation: 457 R Axis:   86 Text Interpretation: Sinus rhythm with Fusion complexes Ventricular pre-excitation, WPW pattern type A Abnormal ECG When compared with ECG of 04-Sep-2021  11:59, Fusion complexes are now Present Wolff-Parkinson-White is now Present Confirmed by Cindee Lame [id number]) on 01/08/2022 4:58:22 PM  Radiology No results found.  Procedures Procedures    Medications Ordered in ED Medications  sodium chloride 0.9 % bolus 1,000 mL (0 mLs Intravenous Stopped 01/08/22 1652)  pantoprazole (PROTONIX) injection 40 mg (40 mg Intravenous Given 01/08/22 1504)  heparin lock  flush 100 UNIT/ML injection (100 Units  Given 01/08/22 1743)    ED Course/ Medical Decision Making/ A&P                           Medical Decision Making Amount and/or Complexity of Data Reviewed Labs: ordered.  Risk Prescription drug management.   This patient presents to the ED with chief complaint(s) of epigastric pain with nausea and vomiting x1 month, early fullness when eating with pertinent past medical history of gastrostomy, liver transplant,.The complaint involves an extensive differential diagnosis and also carries with it a high risk of complications and morbidity.    The differential diagnosis includes pancreatitis, gastroenteritis, gastritis, gastroparesis  The initial plan is to obtain baseline labs including CBC, CMP, lipase; obtain EKG  Additional history obtained: Records reviewed  cardiology notes  Initial Assessment:   On exam, patient appears chronically ill but in no acute distress.  Her skin is pale, but warm and dry.  Heart rate and rhythm are normal.  Lungs clear to auscultation bilaterally.  Tenderness to palpation of epigastric region, no distention, normal bowel sounds.  Independent ECG/labs interpretation:  The following labs were independently interpreted:  CBC significant for microcytic anemia, patient has history of anemia.  CMP reveals elevated creatinine at 1.89, higher than patient's typical creatinine.  Mild hypocalcemia, no other electrolyte disturbance.  UA only significant for trace amount of protein, no evidence of UTI, no  proteinuria.  ECG demonstrates sinus rhythm with WPW.  Independent visualization and interpretation of imaging: Based on physical exam, I do not feel that additional imaging is warranted at this time.  She has normal active bowel sounds and no abdominal distention.  Patient has chronic diarrhea, not suspicious of bowel obstruction.  Treatment and Reassessment: We will treat patient with IV fluids and Protonix to help with epigastric pain and reflux type symptoms.  Patient states her symptoms improved following these interventions.  Disposition:   Based on patient presentation, suspect patient may be experiencing gastroparesis.  She has had symptoms similar to this in the past but reports this is the worst it has been.  Laboratory work-up is reassuring, not suspicious of infectious etiology, no evidence of pancreatitis, LFTs are not elevated.  She does have persistent anemia, recommended she discuss this with her PCP.  Will send patient home on course of pantoprazole to help with reflux symptoms.  Plan to also give patient Reglan to use before meals to help with gastric emptying.  Discussed dietary changes with patient to help with her symptoms and stressed importance of hydration.  Recommended patient follow-up with PCP or GI as soon as possible.  Strict return precautions were given.          Final Clinical Impression(s) / ED Diagnoses Final diagnoses:  Epigastric pain  Nausea and vomiting, unspecified vomiting type    Rx / DC Orders ED Discharge Orders          Ordered    pantoprazole (PROTONIX) 20 MG tablet  Daily        01/08/22 1708    metoCLOPramide (REGLAN) 10 MG tablet  3 times daily before meals        01/08/22 1708              Carlis Abbott [place], [place] 01/08/22 2319    Audley Hose, MD 01/29/22 1115

## 2022-01-08 NOTE — ED Notes (Signed)
Port flushed with 37m NS and 500units/564mof Heparin and needle removed

## 2022-01-08 NOTE — ED Triage Notes (Signed)
Pt c/o intermittent epigastric pain, nausea, and vomiting for about 1 month.

## 2022-01-09 ENCOUNTER — Other Ambulatory Visit (HOSPITAL_COMMUNITY): Payer: Self-pay

## 2022-01-13 ENCOUNTER — Other Ambulatory Visit (HOSPITAL_COMMUNITY): Payer: Self-pay

## 2022-01-20 ENCOUNTER — Other Ambulatory Visit (HOSPITAL_COMMUNITY): Payer: Self-pay

## 2022-01-20 DIAGNOSIS — Z79899 Other long term (current) drug therapy: Secondary | ICD-10-CM | POA: Diagnosis not present

## 2022-01-20 DIAGNOSIS — B2790 Infectious mononucleosis, unspecified without complication: Secondary | ICD-10-CM | POA: Diagnosis not present

## 2022-01-20 DIAGNOSIS — B259 Cytomegaloviral disease, unspecified: Secondary | ICD-10-CM | POA: Diagnosis not present

## 2022-01-20 DIAGNOSIS — R197 Diarrhea, unspecified: Secondary | ICD-10-CM | POA: Diagnosis not present

## 2022-01-20 DIAGNOSIS — Z944 Liver transplant status: Secondary | ICD-10-CM | POA: Diagnosis not present

## 2022-01-20 DIAGNOSIS — K900 Celiac disease: Secondary | ICD-10-CM | POA: Diagnosis not present

## 2022-01-21 ENCOUNTER — Other Ambulatory Visit (HOSPITAL_COMMUNITY): Payer: Self-pay | Admitting: Gastroenterology

## 2022-01-21 ENCOUNTER — Other Ambulatory Visit (HOSPITAL_COMMUNITY): Payer: Self-pay

## 2022-01-21 DIAGNOSIS — R112 Nausea with vomiting, unspecified: Secondary | ICD-10-CM

## 2022-01-21 DIAGNOSIS — R1084 Generalized abdominal pain: Secondary | ICD-10-CM

## 2022-01-22 ENCOUNTER — Other Ambulatory Visit (HOSPITAL_COMMUNITY): Payer: Self-pay

## 2022-01-24 DIAGNOSIS — Z944 Liver transplant status: Principal | ICD-10-CM

## 2022-01-24 DIAGNOSIS — Z79899 Other long term (current) drug therapy: Principal | ICD-10-CM

## 2022-01-24 DIAGNOSIS — R112 Nausea with vomiting, unspecified: Principal | ICD-10-CM

## 2022-01-24 DIAGNOSIS — R1013 Epigastric pain: Principal | ICD-10-CM

## 2022-01-27 ENCOUNTER — Ambulatory Visit (HOSPITAL_COMMUNITY): Payer: 59 | Attending: Cardiology

## 2022-01-27 DIAGNOSIS — Z79899 Other long term (current) drug therapy: Principal | ICD-10-CM

## 2022-01-27 DIAGNOSIS — Z944 Liver transplant status: Principal | ICD-10-CM

## 2022-01-27 DIAGNOSIS — R011 Cardiac murmur, unspecified: Secondary | ICD-10-CM | POA: Diagnosis not present

## 2022-01-27 LAB — ECHOCARDIOGRAM COMPLETE
Area-P 1/2: 2.46 cm2
S' Lateral: 2.7 cm

## 2022-01-28 ENCOUNTER — Other Ambulatory Visit (HOSPITAL_COMMUNITY): Payer: Self-pay

## 2022-01-30 ENCOUNTER — Other Ambulatory Visit (HOSPITAL_COMMUNITY): Payer: Self-pay

## 2022-01-31 ENCOUNTER — Other Ambulatory Visit (HOSPITAL_COMMUNITY): Payer: Self-pay

## 2022-02-03 ENCOUNTER — Other Ambulatory Visit (HOSPITAL_COMMUNITY): Payer: Self-pay

## 2022-02-03 ENCOUNTER — Encounter (HOSPITAL_COMMUNITY)
Admission: RE | Admit: 2022-02-03 | Discharge: 2022-02-03 | Disposition: A | Discharge status: Home / Self Care | Payer: 59 | Source: Ambulatory Visit | Attending: Gastroenterology | Admitting: Gastroenterology

## 2022-02-03 DIAGNOSIS — R1084 Generalized abdominal pain: Secondary | ICD-10-CM | POA: Insufficient documentation

## 2022-02-03 DIAGNOSIS — R112 Nausea with vomiting, unspecified: Secondary | ICD-10-CM | POA: Insufficient documentation

## 2022-02-03 DIAGNOSIS — Z944 Liver transplant status: Secondary | ICD-10-CM | POA: Diagnosis not present

## 2022-02-03 DIAGNOSIS — Z0389 Encounter for observation for other suspected diseases and conditions ruled out: Secondary | ICD-10-CM | POA: Diagnosis not present

## 2022-02-03 DIAGNOSIS — Z79899 Other long term (current) drug therapy: Secondary | ICD-10-CM | POA: Diagnosis not present

## 2022-02-03 MED ORDER — TECHNETIUM TC 99M SULFUR COLLOID
2.1000 | Freq: Once | INTRAVENOUS | Status: AC | PRN
  Administered 2022-02-03: 2.1 via INTRAVENOUS

## 2022-02-04 DIAGNOSIS — Z124 Encounter for screening for malignant neoplasm of cervix: Secondary | ICD-10-CM | POA: Diagnosis not present

## 2022-02-04 DIAGNOSIS — R590 Localized enlarged lymph nodes: Secondary | ICD-10-CM | POA: Diagnosis not present

## 2022-02-04 DIAGNOSIS — Z01419 Encounter for gynecological examination (general) (routine) without abnormal findings: Secondary | ICD-10-CM | POA: Diagnosis not present

## 2022-02-04 DIAGNOSIS — D649 Anemia, unspecified: Secondary | ICD-10-CM | POA: Diagnosis not present

## 2022-02-04 DIAGNOSIS — Z6833 Body mass index (BMI) 33.0-33.9, adult: Secondary | ICD-10-CM | POA: Diagnosis not present

## 2022-02-05 ENCOUNTER — Other Ambulatory Visit: Payer: Self-pay | Admitting: Cardiology

## 2022-02-06 ENCOUNTER — Other Ambulatory Visit (HOSPITAL_COMMUNITY): Payer: Self-pay

## 2022-02-06 ENCOUNTER — Other Ambulatory Visit: Payer: Self-pay

## 2022-02-06 ENCOUNTER — Other Ambulatory Visit: Payer: Self-pay | Admitting: Obstetrics and Gynecology

## 2022-02-06 DIAGNOSIS — R590 Localized enlarged lymph nodes: Secondary | ICD-10-CM

## 2022-02-06 MED ORDER — CARVEDILOL 25 MG PO TABS
12.5000 mg | ORAL_TABLET | Freq: Two times a day (BID) | ORAL | 2 refills | Status: DC
  Filled 2022-02-06: qty 90, 90d supply, fill #0
  Filled 2022-04-28: qty 90, 90d supply, fill #1
  Filled 2022-08-20: qty 90, 90d supply, fill #2

## 2022-02-10 ENCOUNTER — Other Ambulatory Visit (HOSPITAL_COMMUNITY): Payer: Self-pay

## 2022-02-10 DIAGNOSIS — Z0000 Encounter for general adult medical examination without abnormal findings: Secondary | ICD-10-CM | POA: Diagnosis not present

## 2022-02-10 DIAGNOSIS — N183 Chronic kidney disease, stage 3 unspecified: Secondary | ICD-10-CM | POA: Diagnosis not present

## 2022-02-10 DIAGNOSIS — R399 Unspecified symptoms and signs involving the genitourinary system: Secondary | ICD-10-CM | POA: Diagnosis not present

## 2022-02-10 DIAGNOSIS — M810 Age-related osteoporosis without current pathological fracture: Secondary | ICD-10-CM | POA: Diagnosis not present

## 2022-02-10 DIAGNOSIS — Z944 Liver transplant status: Secondary | ICD-10-CM | POA: Diagnosis not present

## 2022-02-10 DIAGNOSIS — Z951 Presence of aortocoronary bypass graft: Secondary | ICD-10-CM | POA: Diagnosis not present

## 2022-02-10 DIAGNOSIS — E785 Hyperlipidemia, unspecified: Secondary | ICD-10-CM | POA: Diagnosis not present

## 2022-02-10 DIAGNOSIS — I10 Essential (primary) hypertension: Secondary | ICD-10-CM | POA: Diagnosis not present

## 2022-02-10 MED ORDER — CEPHALEXIN 500 MG PO CAPS
500.0000 mg | ORAL_CAPSULE | Freq: Two times a day (BID) | ORAL | 0 refills | Status: DC
  Filled 2022-02-10 (×2): qty 14, 7d supply, fill #0

## 2022-02-10 MED ORDER — UROGESIC-BLUE 81.6 MG PO TABS
1.0000 | ORAL_TABLET | Freq: Four times a day (QID) | ORAL | 0 refills | Status: DC
  Filled 2022-02-10 (×2): qty 40, 10d supply, fill #0

## 2022-02-11 ENCOUNTER — Other Ambulatory Visit (HOSPITAL_COMMUNITY): Payer: Self-pay

## 2022-02-24 DIAGNOSIS — Z944 Liver transplant status: Principal | ICD-10-CM

## 2022-02-24 DIAGNOSIS — Z79899 Other long term (current) drug therapy: Principal | ICD-10-CM

## 2022-02-25 ENCOUNTER — Other Ambulatory Visit (HOSPITAL_COMMUNITY): Payer: Self-pay

## 2022-02-26 ENCOUNTER — Other Ambulatory Visit (HOSPITAL_COMMUNITY): Payer: Self-pay

## 2022-02-28 ENCOUNTER — Other Ambulatory Visit (HOSPITAL_COMMUNITY): Payer: Self-pay

## 2022-02-28 DIAGNOSIS — K743 Primary biliary cirrhosis: Secondary | ICD-10-CM | POA: Diagnosis not present

## 2022-02-28 DIAGNOSIS — K219 Gastro-esophageal reflux disease without esophagitis: Secondary | ICD-10-CM | POA: Diagnosis not present

## 2022-02-28 DIAGNOSIS — Z944 Liver transplant status: Secondary | ICD-10-CM | POA: Diagnosis not present

## 2022-02-28 DIAGNOSIS — K529 Noninfective gastroenteritis and colitis, unspecified: Secondary | ICD-10-CM | POA: Diagnosis not present

## 2022-02-28 MED ORDER — PANTOPRAZOLE SODIUM 40 MG PO TBEC
40.0000 mg | DELAYED_RELEASE_TABLET | Freq: Two times a day (BID) | ORAL | 3 refills | Status: DC
  Filled 2022-02-28: qty 180, 90d supply, fill #0
  Filled 2022-07-03: qty 180, 90d supply, fill #1
  Filled 2022-09-26: qty 180, 90d supply, fill #2
  Filled 2023-01-21: qty 180, 90d supply, fill #3

## 2022-02-28 MED ORDER — SUCRALFATE 1 G PO TABS
1.0000 g | ORAL_TABLET | Freq: Four times a day (QID) | ORAL | 1 refills | Status: DC
  Filled 2022-02-28: qty 56, 14d supply, fill #0
  Filled 2022-04-11 (×2): qty 56, 14d supply, fill #1

## 2022-03-03 ENCOUNTER — Ambulatory Visit
Admission: RE | Admit: 2022-03-03 | Discharge: 2022-03-03 | Disposition: A | Discharge status: Home / Self Care | Payer: Commercial Managed Care - PPO | Source: Ambulatory Visit | Attending: Obstetrics and Gynecology | Admitting: Obstetrics and Gynecology

## 2022-03-03 ENCOUNTER — Other Ambulatory Visit (HOSPITAL_COMMUNITY): Payer: Self-pay

## 2022-03-03 DIAGNOSIS — R590 Localized enlarged lymph nodes: Secondary | ICD-10-CM

## 2022-03-03 DIAGNOSIS — R221 Localized swelling, mass and lump, neck: Secondary | ICD-10-CM | POA: Diagnosis not present

## 2022-03-04 ENCOUNTER — Other Ambulatory Visit: Payer: Self-pay

## 2022-03-20 ENCOUNTER — Other Ambulatory Visit (HOSPITAL_COMMUNITY): Payer: Self-pay

## 2022-03-24 ENCOUNTER — Other Ambulatory Visit (HOSPITAL_COMMUNITY): Payer: Self-pay

## 2022-03-24 DIAGNOSIS — Z944 Liver transplant status: Principal | ICD-10-CM

## 2022-03-24 DIAGNOSIS — Z79899 Other long term (current) drug therapy: Principal | ICD-10-CM

## 2022-03-24 MED ORDER — ENVARSUS XR 4 MG TABLET,EXTENDED RELEASE
ORAL_TABLET | Freq: Every day | ORAL | 3 refills | 0 days
Start: 2022-03-24 — End: ?

## 2022-03-25 ENCOUNTER — Other Ambulatory Visit (HOSPITAL_COMMUNITY): Payer: Self-pay

## 2022-03-25 MED ORDER — ENVARSUS XR 4 MG TABLET,EXTENDED RELEASE
ORAL_TABLET | Freq: Every day | ORAL | 3 refills | 90 days | Status: CP
Start: 2022-03-25 — End: ?

## 2022-03-25 MED ORDER — ENVARSUS XR 4 MG PO TB24
ORAL_TABLET | ORAL | 3 refills | Status: DC
  Filled 2022-03-25: qty 30, 30d supply, fill #0
  Filled 2022-04-18: qty 30, 30d supply, fill #1
  Filled 2022-05-26: qty 30, 30d supply, fill #2

## 2022-03-28 ENCOUNTER — Other Ambulatory Visit (HOSPITAL_COMMUNITY): Payer: Self-pay

## 2022-03-28 NOTE — Progress Notes (Unsigned)
[place] NOTE  Patient Care Team: Scifres, Dorothy, PA-C (Inactive) as PCP - General (Physician Assistant) Revankar, Reita Cliche, MD as PCP - Cardiology (Cardiology)  ASSESSMENT & PLAN:  Pancytopenia (Christine Butler) The cause of her thrombocytopenia is likely due to splenomegaly The cause of her splenomegaly is likely due to her liver disease She does not need treatment for her thrombocytopenia The cause of her anemia is likely due to anemia chronic kidney disease Once I am able to exclude nutritional deficiency, if confirmed that she has low serum erythropoietin level, I would recommend erythropoietin stimulating agent to treat anemia I will call her with test results The risk, benefits, side effects of erythropoietin stimulating agent is fully discussed and she is agreeable to proceed  CKD (chronic kidney disease), stage III (Christine Butler) I suspect this is related to her medications and prior complications from her heart disease and transplant Her kidney function is stable She will continue close follow-up and monitoring  S/P liver transplant (Christine Butler) I would defer to her transplant team to direct her immunosuppressant therapy  Orders Placed This Encounter  Procedures   Erythropoietin    Standing Status:   Future    Number of Occurrences:   1    Standing Expiration Date:   04/01/2023   Ferritin    Standing Status:   Future    Number of Occurrences:   1    Standing Expiration Date:   03/31/2023   Iron and Iron Binding Capacity ([facility] only)    Standing Status:   Future    Number of Occurrences:   1    Standing Expiration Date:   04/01/2023   Vitamin B12    Standing Status:   Future    Number of Occurrences:   1    Standing Expiration Date:   04/01/2023   Sedimentation rate    Standing Status:   Future    Number of Occurrences:   1    Standing Expiration Date:   04/01/2023   Reticulocytes    Standing Status:   Future    Number of Occurrences:   1    Standing Expiration Date:    04/01/2023   CBC with Differential/Platelet    Standing Status:   Standing    Number of Occurrences:   22    Standing Expiration Date:   10/25/4780   Basic Metabolic Panel - [place] Only    Standing Status:   Future    Number of Occurrences:   1    Standing Expiration Date:   04/01/2023   Folate RBC    Standing Status:   Future    Number of Occurrences:   1    Standing Expiration Date:   03/31/2023    All questions were answered. The patient knows to call the clinic with any problems, questions or concerns.  The total time spent in the appointment was 60 minutes encounter with patients including review of chart and various tests results, discussions about plan of care and coordination of care plan  Heath Lark, MD 2/5/20243:29 PM   CHIEF COMPLAINTS/PURPOSE OF CONSULTATION:  Anemia  HISTORY OF PRESENTING ILLNESS:  Christine Butler 52 y.o. female is here because of anemia  She was found to have abnormal CBC from I have the opportunity to review her blood count dated back to many years Over the past few years, hemoglobin ranged between 7.1-11.1 The patient is known to have liver disease status post liver transplant and is on  chronic immunosuppressive therapy The patient has chronic kidney disease Her last upper endoscopy evaluation was on October 11, 2018.  For what I could review, she has portal hypertensive gastropathy with grade 2 esophageal varices The patient have history of coronary artery disease status post surgery She is due for repeat EGD and colonoscopy this month She denies any signs of bleeding She complained of excessive fatigue  She denies recent chest pain on exertion, shortness of breath on minimal exertion, pre-syncopal episodes, or palpitations. She had not noticed any recent bleeding such as epistaxis, hematuria or hematochezia The patient denies over the counter NSAID ingestion. She is on antiplatelets agents. She has remote history of abnormal Pap smear status  post hysterectomy Her age appropriate screening programs are up-to-date. She denies any pica and eats a variety of diet. She never donated blood but has received blood transfusion  MEDICAL HISTORY:  Past Medical History:  Diagnosis Date   (HFpEF) heart failure with preserved ejection fraction ([place]) 08/09/2019   Acute blood loss anemia 04/20/2017   Altered mental status 08/14/2019   Arthritis    right knee   Ascites--mild this admit 11/06/2017   Atypical chest pain 11/05/2017   Atypical squamous cells of undetermined significance (ASCUS) on Papanicolaou smear of cervix 08/05/2018   CAD (coronary artery disease)    a. s/p CABGx2 in 02/2017 (LAD not suitable for PCI), EF normal.   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   Chronic low back pain    Cirrhosis ([place]) 03/16/2017   Coronary artery disease 03/24/2017   COVID-19 09/18/2020   Current episode of major depressive disorder without prior episode 03/13/2020   Dyslipidemia, goal LDL below 70 10/13/2017   Elevated LFTs 04/20/2017   Encephalopathy 09/29/2019   hepatic encephalopathy   Familial hyperlipidemia    GERD (gastroesophageal reflux disease)    GI bleed 02/01/2018   H/O LEEP 03/30/2019   H/O two vessel coronary artery bypass graft 03/30/2019   High grade squamous intraepithelial lesion (HGSIL) on cytologic smear of cervix 05/04/2019   History of gastrostomy, has currently [facility]) 07/12/2020   Formatting of this note might be different from the original. Patient underwent ERCP 07/11/20. Procedure c/b hepaticogastrostomy stent placement.   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 IMOUPDATE   Primary biliary cirrhosis ([place])    cirhosis/liver disease followed by transplant team led by Dr Manuella Ghazi at [place]    S/P CABG (coronary artery bypass graft)    S/P CABG x 2 03/24/2017   LIMA to DIAGONAL Portion of SVG/LEFT RADIAL to LAD   S/P LEEP (loop electrosurgical excision procedure) 05/11/2019   S/P TAH-BSO 05/13/2021   Secondary generalized osteoporosis 11/19/2021   Status post liver transplantation ([place]) 03/30/2019   SVD (spontaneous vaginal delivery)    x 3   Upper GI bleed 02/01/2018   Urinary incontinence  occ   Urinary tract bacterial infections last oct 2021   Wears glasses     SURGICAL HISTORY: Past Surgical History:  Procedure Laterality Date   CERVICAL CONIZATION W/BX N/A 03/22/2020   Procedure: CONIZATION CERVIX WITH BIOPSY;  Surgeon: Dian Queen, MD;  Location: [place];  Service: Gynecology;  Laterality: N/A;   CHOLECYSTECTOMY  09/2019   COLPOSCOPY N/A 03/22/2020   Procedure: COLPOSCOPY;  Surgeon: Dian Queen, MD;  Location: [facility];  Service: Gynecology;  Laterality: N/A;   CORONARY ARTERY BYPASS GRAFT N/A 03/24/2017   Procedure: CORONARY ARTERY BYPASS GRAFTING (CABG) x two, using left internal mammary artery, left radial artery, and right leg greater saphenous vein harvested endoscopically;  Surgeon: Ivin Poot, MD;  Location: [place];  Service: Open Heart Surgery;  Laterality: N/A;   ENDOVEIN HARVEST OF GREATER SAPHENOUS VEIN Right 03/24/2017   Procedure: ENDOVEIN HARVEST OF GREATER SAPHENOUS VEIN;  Surgeon: Ivin Poot, MD;  Location: [place];  Service: Open Heart Surgery;  Laterality: Right;   ESOPHAGEAL BANDING  02/01/2018   Procedure: ESOPHAGEAL BANDING;  Surgeon: [name], MD;  Location: [facility];  Service: Gastroenterology;;   ESOPHAGEAL BANDING N/A 03/29/2018   Procedure: ESOPHAGEAL BANDING;  Surgeon: [name], MD;  [facility];  Service: Gastroenterology;  Laterality: N/A;   ESOPHAGEAL BANDING N/A 05/21/2018   Procedure: ESOPHAGEAL BANDING;  Surgeon: [name], MD;  [facility];  Service: Gastroenterology;  Laterality: N/A;   ESOPHAGEAL BANDING N/A 10/11/2018   Procedure: ESOPHAGEAL BANDING;  Surgeon: [name], MD;  [facility];  Service: Gastroenterology;  Laterality: N/A;   ESOPHAGOGASTRODUODENOSCOPY N/A 03/29/2018   Procedure: ESOPHAGOGASTRODUODENOSCOPY (EGD);  Surgeon: [name], MD;  Location: [facility];  Service: Gastroenterology;  Laterality: N/A;   ESOPHAGOGASTRODUODENOSCOPY N/A 05/21/2018   Procedure: ESOPHAGOGASTRODUODENOSCOPY (EGD);  Surgeon: [name], MD;  Location: [facility];  Service: Gastroenterology;  Laterality: N/A;   ESOPHAGOGASTRODUODENOSCOPY (EGD) WITH PROPOFOL N/A 02/01/2018   Procedure: ESOPHAGOGASTRODUODENOSCOPY (EGD) WITH PROPOFOL;  Surgeon: [name], MD;  [facility];  Service: Gastroenterology;  Laterality: N/A;   ESOPHAGOGASTRODUODENOSCOPY (EGD) WITH PROPOFOL N/A 10/11/2018   Procedure: ESOPHAGOGASTRODUODENOSCOPY (EGD) WITH PROPOFOL;  Surgeon: [name], MD;  [facility];  Service: Gastroenterology;  Laterality: N/A;   HYSTERECTOMY ABDOMINAL WITH SALPINGO-OOPHORECTOMY Bilateral 05/13/2021   Procedure: TOTAL ABDOMINAL HYSTERECTOMY, BILATERAL SALPINGO OPPHORECTOMY;  Surgeon: Dian Queen, MD;  Location: [place];  Service: Gynecology;  Laterality: Bilateral;   INCONTINENCE SURGERY  2009   urinary  bladder sling   IR IMAGING GUIDED PORT INSERTION  04/08/2019   LEEP N/A 03/28/2014   Procedure: LOOP ELECTROSURGICAL EXCISION PROCEDURE (LEEP) cone biopsy;  Surgeon: Cyril Mourning, MD;  Location: [facility];  Service: Gynecology;  Laterality: N/A;   LEEP N/A  03/22/2020   Procedure: LOOP ELECTROSURGICAL EXCISION PROCEDURE (LEEP);  Surgeon: Dian Queen, MD;  Location[facility];  Service: Gynecology;  Laterality: N/A;   LEFT HEART CATH AND CORONARY ANGIOGRAPHY N/A 03/23/2017   Procedure: LEFT HEART CATH AND CORONARY ANGIOGRAPHY;  Surgeon: Jettie Booze, MD;  Location: [place] [facility];  Service: Cardiovascular;  Laterality: N/A;   LIVER BIOPSY     x 2   LIVER TRANSPLANT  08/ 24/2021   RADIAL ARTERY HARVEST Left 03/24/2017   Procedure: RADIAL ARTERY HARVEST;  Surgeon: Ivin Poot, MD;  Location: [place];  Service: Open Heart Surgery;  Laterality: Left;   TEE WITHOUT CARDIOVERSION N/A 03/24/2017   Procedure:  TRANSESOPHAGEAL ECHOCARDIOGRAM (TEE);  Surgeon: Prescott Gum, Collier Salina, MD;  Location: [place];  Service: Open Heart Surgery;  Laterality: N/A;   WISDOM TOOTH EXTRACTION  yrs ago    SOCIAL HISTORY: Social History   Socioeconomic History   Marital status: Divorced    Spouse name: Not on file   Number of children: 3   Years of education: 16   Highest education level: Associate degree: academic program  Occupational History   Not on file  Tobacco Use   Smoking status: Never   Smokeless tobacco: Never  Vaping Use   Vaping Use: Never used  Substance and Sexual Activity   Alcohol use: Not Currently    Alcohol/week: 1.0 standard drink of alcohol    Types: 1 Glasses of wine per week   Drug use: No   Sexual activity: Yes    Partners: Male    Birth control/protection: None  Other Topics Concern   Not on file  Social History Narrative   Not on file   Social Determinants of Health   Financial Resource Strain: Low Risk  (03/23/2017)   Overall Financial Resource Strain (CARDIA)    Difficulty of Paying Living Expenses: Not hard at all  Food Insecurity: No Food Insecurity (03/23/2017)   Hunger Vital Sign    Worried About Running Out of Food in the Last Year: Never true    [place] in the Last Year: Never true   Transportation Needs: No Transportation Needs (03/23/2017)   PRAPARE - Hydrologist (Medical): No    Lack of Transportation (Non-Medical): No  Physical Activity: Inactive (03/23/2017)   Exercise Vital Sign    Days of Exercise per Week: 0 days    Minutes of Exercise per Session: 0 min  Stress: No Stress Concern Present (03/23/2017)   [place]    Feeling of Stress : Not at all  Social Connections: Moderately Integrated (03/23/2017)   Social Connection and Isolation Panel [NHANES]    Frequency of Communication with Friends and Family: More than three times a week    Frequency of Social Gatherings with Friends and Family: More than three times a week    Attends Religious Services: More than 4 times per year    Active Member of [employer] or Organizations: Yes    Attends Music therapist: More than 4 times per year    Marital Status: Divorced  Intimate Partner Violence: Not At Risk (03/23/2017)   Humiliation, Afraid, Rape, and Kick questionnaire    Fear of Current or Ex-Partner: No    Emotionally Abused: No    Physically Abused: No    Sexually Abused: No    FAMILY HISTORY: Family History  Problem Relation Age of Onset   CAD Father    Diabetes Mellitus II Father    Heart disease Father    Heart attack Brother    Heart disease Maternal Aunt    Heart attack Paternal Grandmother    Heart attack Paternal Grandfather     ALLERGIES:  is allergic to codeine, erythromycin, and fentanyl.  MEDICATIONS:  Current Outpatient Medications  Medication Sig Dispense Refill   loperamide (IMODIUM A-D) 2 MG tablet Take 2 mg by mouth 4 (four) times daily as needed for diarrhea or loose stools.     acetaminophen (TYLENOL) 500 MG tablet Take 1,000 mg by mouth every 8 (eight) hours as needed for mild pain.     ASPIRIN LOW  DOSE 81 MG EC tablet Take 81 mg by mouth daily.     Biotin 5 MG CAPS Take 5 mg  by mouth daily in the afternoon.     Calcium Citrate (CITRACAL PO) Take 1,200 mg by mouth daily.     carvedilol (COREG) 25 MG tablet Take 1/2 tablet (12.5 mg) by mouth 2 times daily with a meal. 90 tablet 2   cycloSPORINE (RESTASIS) 0.05 % ophthalmic emulsion Place 1 drop into both eyes at bedtime.      ENVARSUS XR 4 MG TB24 Take 1 tablet (4 mg total) by mouth in the morning. 90 tablet 3   estradiol (VIVELLE-DOT) 0.05 MG/24HR patch Apply 1 patch twice a week 24 patch 3   finasteride (PROSCAR) 5 MG tablet Take 1 tablet (5 mg total) by mouth daily. (Patient taking differently: Take 2.5 mg by mouth every evening.) 90 tablet 3   fluticasone (FLONASE) 50 MCG/ACT nasal spray Place 1 spray into both nostrils daily as needed for allergies or rhinitis.      metroNIDAZOLE (METROCREAM) 0.75 % cream Apply 1 application externally to affected area 2 times a day (Patient not taking: Reported on 01/02/2022) 45 g 2   nitroGLYCERIN (NITROSTAT) 0.4 MG SL tablet Place 0.4 mg under the tongue every 5 (five) minutes as needed for chest pain.     pantoprazole (PROTONIX) 40 MG tablet Take 1 tablet (40 mg total) by mouth 2 (two) times daily. 180 tablet 3   rosuvastatin (CRESTOR) 5 MG tablet Take 1 tablet (5 mg total) by mouth daily. 90 tablet 3   sucralfate (CARAFATE) 1 g tablet Take 1 tablet (1 g total) by mouth 4 (four) times daily on an empty stomach for 14 days 56 tablet 1   ursodiol (ACTIGALL) 300 MG capsule Take 1 capsule by mouth 3 times a day. 270 capsule 3   Current Facility-Administered Medications  Medication Dose Route Frequency Provider Last Rate Last Admin   heparin lock flush 100 unit/mL  500 Units Intravenous Once Mannam, Praveen, MD        REVIEW OF SYSTEMS:   Constitutional: Denies fevers, chills or abnormal night sweats Eyes: Denies blurriness of vision, double vision or watery eyes Ears, nose, mouth, throat, and face: Denies mucositis or sore throat Respiratory: Denies cough, dyspnea or  wheezes Cardiovascular: Denies palpitation, chest discomfort or lower extremity swelling Gastrointestinal:  Denies nausea, heartburn or change in bowel habits Skin: Denies abnormal skin rashes Lymphatics: Denies new lymphadenopathy or easy bruising Neurological:Denies numbness, tingling or new weaknesses Behavioral/Psych: Mood is stable, no new changes  All other systems were reviewed with the patient and are negative.  PHYSICAL EXAMINATION: ECOG PERFORMANCE STATUS: 1 - Symptomatic but completely ambulatory  Vitals:   03/31/22 0908  BP: 127/83  Pulse: 67  Resp: 18  Temp: (!) 97.4 F (36.3 C)  SpO2: 99%   Filed Weights   03/31/22 0908  Weight: 180 lb 9.6 oz (81.9 kg)    GENERAL:alert, no distress and comfortable SKIN: skin color, texture, turgor are normal, no rashes or significant lesions EYES: normal, conjunctiva are pink and non-injected, sclera clear OROPHARYNX:no exudate, no erythema and lips, buccal mucosa, and tongue normal  NECK: supple, thyroid normal size, non-tender, without nodularity LYMPH:  no palpable lymphadenopathy in the cervical, axillary or inguinal LUNGS: clear to auscultation and percussion with normal breathing effort HEART: regular rate & rhythm and no murmurs and no lower extremity edema ABDOMEN:abdomen soft, non-tender and normal bowel sounds.  Noted well-healed surgical scar  Musculoskeletal:no cyanosis of digits and no clubbing  PSYCH: alert & oriented x 3 with fluent speech NEURO: no focal motor/sensory deficits  RADIOGRAPHIC STUDIES: I have personally reviewed the radiological images as listed and agreed with the findings in the report. US THYROID  Result Date: 03/04/2022 CLINICAL DATA:  Palpable abnormality. EXAM: THYROID ULTRASOUND TECHNIQUE: Ultrasound examination of the thyroid gland and adjacent soft tissues was performed. COMPARISON:  None Available. FINDINGS: Parenchymal Echotexture: Mildly heterogeneous Isthmus: 0.4 cm Right lobe: 4.7 x  1.9 x 1.9 cm Left lobe: 5.2 x 1.4 x 1.8 cm ________________________________________________________ Estimated total number of nodules >/= 1 cm: 0 Number of spongiform nodules >/=  2 cm not described below (TR1): 0 Number of mixed cystic and solid nodules >/= 1.5 cm not described below (TR2): 0 _________________________________________________________ No discrete nodules are seen within the thyroid gland. No sonographic abnormality in the right supraclavicular palpable area of concern. No cervical lymphadenopathy. IMPRESSION: 1. Normal sonographic appearance of the thyroid gland without discrete nodule. 2. No sonographic abnormality in the right supraclavicular palpable area of concern. The above is in keeping with the ACR TI-RADS recommendations - J Am Coll Radiol 2017;14:587-595. Ruthann Cancer, MD [facility] Radiology Electronically Signed   By: Ruthann Cancer M.D.   On: 03/04/2022 09:19

## 2022-03-31 ENCOUNTER — Other Ambulatory Visit (HOSPITAL_COMMUNITY): Payer: Self-pay

## 2022-03-31 ENCOUNTER — Other Ambulatory Visit: Payer: Self-pay

## 2022-03-31 ENCOUNTER — Inpatient Hospital Stay: Payer: Commercial Managed Care - PPO | Attending: Hematology and Oncology | Admitting: Hematology and Oncology

## 2022-03-31 ENCOUNTER — Encounter: Payer: Self-pay | Admitting: Hematology and Oncology

## 2022-03-31 ENCOUNTER — Inpatient Hospital Stay: Payer: Commercial Managed Care - PPO

## 2022-03-31 VITALS — BP 127/83 | HR 67 | Temp 97.4°F | Resp 18 | Ht 61.5 in | Wt 180.6 lb

## 2022-03-31 DIAGNOSIS — D539 Nutritional anemia, unspecified: Secondary | ICD-10-CM | POA: Diagnosis not present

## 2022-03-31 DIAGNOSIS — N1832 Chronic kidney disease, stage 3b: Secondary | ICD-10-CM

## 2022-03-31 DIAGNOSIS — E7849 Other hyperlipidemia: Secondary | ICD-10-CM | POA: Diagnosis not present

## 2022-03-31 DIAGNOSIS — Z944 Liver transplant status: Secondary | ICD-10-CM | POA: Insufficient documentation

## 2022-03-31 DIAGNOSIS — D61818 Other pancytopenia: Secondary | ICD-10-CM | POA: Diagnosis not present

## 2022-03-31 DIAGNOSIS — K219 Gastro-esophageal reflux disease without esophagitis: Secondary | ICD-10-CM | POA: Diagnosis not present

## 2022-03-31 DIAGNOSIS — I130 Hypertensive heart and chronic kidney disease with heart failure and stage 1 through stage 4 chronic kidney disease, or unspecified chronic kidney disease: Secondary | ICD-10-CM | POA: Insufficient documentation

## 2022-03-31 DIAGNOSIS — Z7982 Long term (current) use of aspirin: Secondary | ICD-10-CM | POA: Insufficient documentation

## 2022-03-31 DIAGNOSIS — I2510 Atherosclerotic heart disease of native coronary artery without angina pectoris: Secondary | ICD-10-CM | POA: Insufficient documentation

## 2022-03-31 DIAGNOSIS — N183 Chronic kidney disease, stage 3 unspecified: Secondary | ICD-10-CM | POA: Diagnosis not present

## 2022-03-31 DIAGNOSIS — I5032 Chronic diastolic (congestive) heart failure: Secondary | ICD-10-CM | POA: Insufficient documentation

## 2022-03-31 DIAGNOSIS — K589 Irritable bowel syndrome without diarrhea: Secondary | ICD-10-CM | POA: Insufficient documentation

## 2022-03-31 DIAGNOSIS — Z79899 Other long term (current) drug therapy: Secondary | ICD-10-CM | POA: Insufficient documentation

## 2022-03-31 LAB — CBC WITH DIFFERENTIAL/PLATELET
Abs Immature Granulocytes: 0.01 10*3/uL (ref 0.00–0.07)
Basophils Absolute: 0 10*3/uL (ref 0.0–0.1)
Basophils Relative: 1 %
Eosinophils Absolute: 0.5 10*3/uL (ref 0.0–0.5)
Eosinophils Relative: 9 %
HCT: 29.1 % — ABNORMAL LOW (ref 36.0–46.0)
Hemoglobin: 10.6 g/dL — ABNORMAL LOW (ref 12.0–15.0)
Immature Granulocytes: 0 %
Lymphocytes Relative: 24 %
Lymphs Abs: 1.3 10*3/uL (ref 0.7–4.0)
MCH: 33 pg (ref 26.0–34.0)
MCHC: 36.4 g/dL — ABNORMAL HIGH (ref 30.0–36.0)
MCV: 90.7 fL (ref 80.0–100.0)
Monocytes Absolute: 0.4 10*3/uL (ref 0.1–1.0)
Monocytes Relative: 7 %
Neutro Abs: 3.3 10*3/uL (ref 1.7–7.7)
Neutrophils Relative %: 59 %
Platelets: 159 10*3/uL (ref 150–400)
RBC: 3.21 MIL/uL — ABNORMAL LOW (ref 3.87–5.11)
RDW: 11.8 % (ref 11.5–15.5)
WBC: 5.5 10*3/uL (ref 4.0–10.5)
nRBC: 0 % (ref 0.0–0.2)

## 2022-03-31 LAB — BASIC METABOLIC PANEL - CANCER CENTER ONLY
Anion gap: 8 (ref 5–15)
BUN: 38 mg/dL — ABNORMAL HIGH (ref 6–20)
CO2: 24 mmol/L (ref 22–32)
Calcium: 9.5 mg/dL (ref 8.9–10.3)
Chloride: 107 mmol/L (ref 98–111)
Creatinine: 1.73 mg/dL — ABNORMAL HIGH (ref 0.44–1.00)
GFR, Estimated: 35 mL/min — ABNORMAL LOW (ref >60)
Glucose, Bld: 102 mg/dL — ABNORMAL HIGH (ref 70–99)
Potassium: 4.8 mmol/L (ref 3.5–5.1)
Sodium: 139 mmol/L (ref 135–145)

## 2022-03-31 LAB — RETICULOCYTES
Immature Retic Fract: 8.1 % (ref 2.3–15.9)
RBC.: 3.16 MIL/uL — ABNORMAL LOW (ref 3.87–5.11)
Retic Count, Absolute: 39.5 10*3/uL (ref 19.0–186.0)
Retic Ct Pct: 1.3 % (ref 0.4–3.1)

## 2022-03-31 LAB — IRON AND IRON BINDING CAPACITY (CC-WL,HP ONLY)
Iron: 141 ug/dL (ref 28–170)
Saturation Ratios: 49 % — ABNORMAL HIGH (ref 10.4–31.8)
TIBC: 287 ug/dL (ref 250–450)
UIBC: 146 ug/dL — ABNORMAL LOW (ref 148–442)

## 2022-03-31 LAB — SEDIMENTATION RATE: Sed Rate: 22 mm/hr (ref 0–22)

## 2022-03-31 LAB — VITAMIN B12: Vitamin B-12: 468 pg/mL (ref 180–914)

## 2022-03-31 LAB — FERRITIN: Ferritin: 273 ng/mL (ref 11–307)

## 2022-03-31 NOTE — Assessment & Plan Note (Signed)
I would defer to her transplant team to direct her immunosuppressant therapy

## 2022-03-31 NOTE — Assessment & Plan Note (Signed)
The cause of her thrombocytopenia is likely due to splenomegaly The cause of her splenomegaly is likely due to her liver disease She does not need treatment for her thrombocytopenia The cause of her anemia is likely due to anemia chronic kidney disease Once I am able to exclude nutritional deficiency, if confirmed that she has low serum erythropoietin level, I would recommend erythropoietin stimulating agent to treat anemia I will call her with test results The risk, benefits, side effects of erythropoietin stimulating agent is fully discussed and she is agreeable to proceed

## 2022-03-31 NOTE — Assessment & Plan Note (Signed)
I suspect this is related to her medications and prior complications from her heart disease and transplant Her kidney function is stable She will continue close follow-up and monitoring

## 2022-04-01 LAB — FOLATE RBC
Folate, Hemolysate: >620 ng/mL
Folate, RBC: >2006 ng/mL (ref >498)
Hematocrit: 30.9 % — ABNORMAL LOW (ref 34.0–46.6)

## 2022-04-01 LAB — ERYTHROPOIETIN: Erythropoietin: 9.2 m[IU]/mL (ref 2.6–18.5)

## 2022-04-03 ENCOUNTER — Other Ambulatory Visit: Payer: Self-pay | Admitting: Hematology and Oncology

## 2022-04-03 DIAGNOSIS — D631 Anemia in chronic kidney disease: Secondary | ICD-10-CM | POA: Insufficient documentation

## 2022-04-03 DIAGNOSIS — N1832 Chronic kidney disease, stage 3b: Secondary | ICD-10-CM

## 2022-04-09 ENCOUNTER — Other Ambulatory Visit (HOSPITAL_COMMUNITY): Payer: Self-pay

## 2022-04-09 MED ORDER — PEG 3350-KCL-NA BICARB-NACL 420 G PO SOLR
ORAL | 0 refills | Status: DC
  Filled 2022-04-09: qty 4000, 1d supply, fill #0

## 2022-04-11 ENCOUNTER — Other Ambulatory Visit (HOSPITAL_COMMUNITY): Payer: Self-pay

## 2022-04-11 ENCOUNTER — Other Ambulatory Visit: Payer: Self-pay

## 2022-04-11 ENCOUNTER — Other Ambulatory Visit (HOSPITAL_BASED_OUTPATIENT_CLINIC_OR_DEPARTMENT_OTHER): Payer: Self-pay

## 2022-04-11 DIAGNOSIS — K831 Obstruction of bile duct: Principal | ICD-10-CM

## 2022-04-11 DIAGNOSIS — T8649 Other complications of liver transplant: Principal | ICD-10-CM

## 2022-04-11 DIAGNOSIS — K8020 Calculus of gallbladder without cholecystitis without obstruction: Principal | ICD-10-CM

## 2022-04-11 DIAGNOSIS — Z944 Liver transplant status: Principal | ICD-10-CM

## 2022-04-11 MED ORDER — URSODIOL 300 MG CAPSULE
ORAL_CAPSULE | 3 refills | 0 days | Status: CP
Start: 2022-04-11 — End: ?

## 2022-04-11 MED ORDER — URSODIOL 300 MG PO CAPS
300.0000 mg | ORAL_CAPSULE | Freq: Three times a day (TID) | ORAL | 3 refills | Status: DC
  Filled 2022-04-11 – 2022-04-18 (×2): qty 270, 90d supply, fill #0
  Filled 2022-07-30: qty 270, 90d supply, fill #1
  Filled 2023-01-21: qty 270, 90d supply, fill #2

## 2022-04-14 ENCOUNTER — Other Ambulatory Visit (HOSPITAL_COMMUNITY): Payer: Self-pay

## 2022-04-14 ENCOUNTER — Other Ambulatory Visit: Payer: Self-pay

## 2022-04-14 DIAGNOSIS — K648 Other hemorrhoids: Secondary | ICD-10-CM | POA: Diagnosis not present

## 2022-04-14 DIAGNOSIS — K5730 Diverticulosis of large intestine without perforation or abscess without bleeding: Secondary | ICD-10-CM | POA: Diagnosis not present

## 2022-04-14 DIAGNOSIS — K3189 Other diseases of stomach and duodenum: Secondary | ICD-10-CM | POA: Diagnosis not present

## 2022-04-14 DIAGNOSIS — K644 Residual hemorrhoidal skin tags: Secondary | ICD-10-CM | POA: Diagnosis not present

## 2022-04-14 DIAGNOSIS — K2289 Other specified disease of esophagus: Secondary | ICD-10-CM | POA: Diagnosis not present

## 2022-04-14 DIAGNOSIS — K2930 Chronic superficial gastritis without bleeding: Secondary | ICD-10-CM | POA: Diagnosis not present

## 2022-04-14 DIAGNOSIS — R197 Diarrhea, unspecified: Secondary | ICD-10-CM | POA: Diagnosis not present

## 2022-04-14 DIAGNOSIS — K219 Gastro-esophageal reflux disease without esophagitis: Secondary | ICD-10-CM | POA: Diagnosis not present

## 2022-04-14 DIAGNOSIS — D120 Benign neoplasm of cecum: Secondary | ICD-10-CM | POA: Diagnosis not present

## 2022-04-15 ENCOUNTER — Encounter (HOSPITAL_COMMUNITY): Payer: Self-pay

## 2022-04-15 ENCOUNTER — Other Ambulatory Visit (HOSPITAL_COMMUNITY): Payer: Self-pay

## 2022-04-16 ENCOUNTER — Other Ambulatory Visit: Payer: Self-pay

## 2022-04-17 ENCOUNTER — Other Ambulatory Visit: Payer: Self-pay

## 2022-04-18 ENCOUNTER — Other Ambulatory Visit (HOSPITAL_COMMUNITY): Payer: Self-pay

## 2022-04-18 ENCOUNTER — Other Ambulatory Visit: Payer: Self-pay

## 2022-04-21 ENCOUNTER — Inpatient Hospital Stay: Payer: Commercial Managed Care - PPO

## 2022-04-21 ENCOUNTER — Other Ambulatory Visit: Payer: Commercial Managed Care - PPO

## 2022-04-21 ENCOUNTER — Other Ambulatory Visit: Payer: Self-pay

## 2022-04-21 ENCOUNTER — Ambulatory Visit: Payer: Commercial Managed Care - PPO

## 2022-04-21 VITALS — BP 122/80 | HR 65 | Temp 98.4°F | Resp 16

## 2022-04-21 DIAGNOSIS — Z944 Liver transplant status: Principal | ICD-10-CM

## 2022-04-21 DIAGNOSIS — Z79899 Other long term (current) drug therapy: Principal | ICD-10-CM

## 2022-04-21 DIAGNOSIS — D539 Nutritional anemia, unspecified: Secondary | ICD-10-CM

## 2022-04-21 DIAGNOSIS — D631 Anemia in chronic kidney disease: Secondary | ICD-10-CM

## 2022-04-21 DIAGNOSIS — D61818 Other pancytopenia: Secondary | ICD-10-CM | POA: Diagnosis not present

## 2022-04-21 LAB — CBC WITH DIFFERENTIAL/PLATELET
Abs Immature Granulocytes: 0.01 10*3/uL (ref 0.00–0.07)
Basophils Absolute: 0 10*3/uL (ref 0.0–0.1)
Basophils Relative: 0 %
Eosinophils Absolute: 0.6 10*3/uL — ABNORMAL HIGH (ref 0.0–0.5)
Eosinophils Relative: 10 %
HCT: 28.3 % — ABNORMAL LOW (ref 36.0–46.0)
Hemoglobin: 10.5 g/dL — ABNORMAL LOW (ref 12.0–15.0)
Immature Granulocytes: 0 %
Lymphocytes Relative: 23 %
Lymphs Abs: 1.5 10*3/uL (ref 0.7–4.0)
MCH: 34 pg (ref 26.0–34.0)
MCHC: 37.1 g/dL — ABNORMAL HIGH (ref 30.0–36.0)
MCV: 91.6 fL (ref 80.0–100.0)
Monocytes Absolute: 0.4 10*3/uL (ref 0.1–1.0)
Monocytes Relative: 6 %
Neutro Abs: 4.2 10*3/uL (ref 1.7–7.7)
Neutrophils Relative %: 61 %
Platelets: 154 10*3/uL (ref 150–400)
RBC: 3.09 MIL/uL — ABNORMAL LOW (ref 3.87–5.11)
RDW: 12.4 % (ref 11.5–15.5)
WBC: 6.8 10*3/uL (ref 4.0–10.5)
nRBC: 0 % (ref 0.0–0.2)

## 2022-04-21 MED ORDER — DARBEPOETIN ALFA 200 MCG/0.4ML IJ SOSY
200.0000 ug | PREFILLED_SYRINGE | Freq: Once | INTRAMUSCULAR | Status: AC
  Administered 2022-04-21: 200 ug via SUBCUTANEOUS
  Filled 2022-04-21: qty 0.4

## 2022-04-21 NOTE — Patient Instructions (Signed)

## 2022-04-25 ENCOUNTER — Other Ambulatory Visit (HOSPITAL_COMMUNITY): Payer: Self-pay

## 2022-04-28 ENCOUNTER — Other Ambulatory Visit (HOSPITAL_COMMUNITY): Payer: Self-pay

## 2022-04-29 ENCOUNTER — Other Ambulatory Visit (HOSPITAL_COMMUNITY): Payer: Self-pay

## 2022-04-29 ENCOUNTER — Other Ambulatory Visit: Payer: Self-pay

## 2022-04-29 MED ORDER — METRONIDAZOLE 0.75 % EX CREA
1.0000 | TOPICAL_CREAM | Freq: Two times a day (BID) | CUTANEOUS | 2 refills | Status: DC
  Filled 2022-04-29: qty 45, 30d supply, fill #0
  Filled 2022-09-26: qty 45, 30d supply, fill #1

## 2022-05-06 ENCOUNTER — Other Ambulatory Visit (HOSPITAL_COMMUNITY): Payer: Self-pay

## 2022-05-06 MED ORDER — AMOXICILLIN-POT CLAVULANATE 875-125 MG PO TABS
1.0000 | ORAL_TABLET | Freq: Two times a day (BID) | ORAL | 0 refills | Status: DC
  Filled 2022-05-06: qty 14, 7d supply, fill #0

## 2022-05-12 ENCOUNTER — Inpatient Hospital Stay: Payer: Commercial Managed Care - PPO

## 2022-05-12 ENCOUNTER — Inpatient Hospital Stay: Payer: Commercial Managed Care - PPO | Attending: Hematology and Oncology

## 2022-05-12 DIAGNOSIS — D539 Nutritional anemia, unspecified: Secondary | ICD-10-CM

## 2022-05-12 DIAGNOSIS — D61818 Other pancytopenia: Secondary | ICD-10-CM | POA: Diagnosis not present

## 2022-05-12 LAB — CBC WITH DIFFERENTIAL/PLATELET
Abs Immature Granulocytes: 0.06 10*3/uL (ref 0.00–0.07)
Basophils Absolute: 0 10*3/uL (ref 0.0–0.1)
Basophils Relative: 1 %
Eosinophils Absolute: 0.4 10*3/uL (ref 0.0–0.5)
Eosinophils Relative: 8 %
HCT: 32.2 % — ABNORMAL LOW (ref 36.0–46.0)
Hemoglobin: 11.5 g/dL — ABNORMAL LOW (ref 12.0–15.0)
Immature Granulocytes: 1 %
Lymphocytes Relative: 29 %
Lymphs Abs: 1.4 10*3/uL (ref 0.7–4.0)
MCH: 33.2 pg (ref 26.0–34.0)
MCHC: 35.7 g/dL (ref 30.0–36.0)
MCV: 93.1 fL (ref 80.0–100.0)
Monocytes Absolute: 0.4 10*3/uL (ref 0.1–1.0)
Monocytes Relative: 8 %
Neutro Abs: 2.7 10*3/uL (ref 1.7–7.7)
Neutrophils Relative %: 53 %
Platelets: 143 10*3/uL — ABNORMAL LOW (ref 150–400)
RBC: 3.46 MIL/uL — ABNORMAL LOW (ref 3.87–5.11)
RDW: 13.3 % (ref 11.5–15.5)
WBC: 5 10*3/uL (ref 4.0–10.5)
nRBC: 0 % (ref 0.0–0.2)

## 2022-05-12 NOTE — Progress Notes (Signed)
Pt here for aranesp injection.  Hgb 11.5.  Parameters met, no need for injection.

## 2022-05-15 ENCOUNTER — Other Ambulatory Visit (HOSPITAL_COMMUNITY): Payer: Self-pay

## 2022-05-16 ENCOUNTER — Emergency Department (HOSPITAL_BASED_OUTPATIENT_CLINIC_OR_DEPARTMENT_OTHER): Payer: Commercial Managed Care - PPO

## 2022-05-16 ENCOUNTER — Inpatient Hospital Stay (HOSPITAL_BASED_OUTPATIENT_CLINIC_OR_DEPARTMENT_OTHER)
Admission: EM | Admit: 2022-05-16 | Discharge: 2022-05-18 | DRG: 194 | Disposition: A | Discharge status: Home / Self Care | Payer: Commercial Managed Care - PPO | Attending: Internal Medicine | Admitting: Internal Medicine

## 2022-05-16 ENCOUNTER — Encounter (HOSPITAL_BASED_OUTPATIENT_CLINIC_OR_DEPARTMENT_OTHER): Payer: Self-pay | Admitting: Emergency Medicine

## 2022-05-16 ENCOUNTER — Other Ambulatory Visit: Payer: Self-pay

## 2022-05-16 ENCOUNTER — Emergency Department (HOSPITAL_BASED_OUTPATIENT_CLINIC_OR_DEPARTMENT_OTHER): Payer: Commercial Managed Care - PPO | Admitting: Radiology

## 2022-05-16 DIAGNOSIS — M79602 Pain in left arm: Secondary | ICD-10-CM | POA: Diagnosis present

## 2022-05-16 DIAGNOSIS — Z8249 Family history of ischemic heart disease and other diseases of the circulatory system: Secondary | ICD-10-CM

## 2022-05-16 DIAGNOSIS — J189 Pneumonia, unspecified organism: Principal | ICD-10-CM

## 2022-05-16 DIAGNOSIS — Z86001 Personal history of in-situ neoplasm of cervix uteri: Secondary | ICD-10-CM

## 2022-05-16 DIAGNOSIS — Z944 Liver transplant status: Secondary | ICD-10-CM | POA: Diagnosis not present

## 2022-05-16 DIAGNOSIS — M818 Other osteoporosis without current pathological fracture: Secondary | ICD-10-CM | POA: Diagnosis present

## 2022-05-16 DIAGNOSIS — Z8616 Personal history of COVID-19: Secondary | ICD-10-CM | POA: Diagnosis not present

## 2022-05-16 DIAGNOSIS — E7849 Other hyperlipidemia: Secondary | ICD-10-CM | POA: Diagnosis not present

## 2022-05-16 DIAGNOSIS — I2584 Coronary atherosclerosis due to calcified coronary lesion: Secondary | ICD-10-CM | POA: Diagnosis not present

## 2022-05-16 DIAGNOSIS — E875 Hyperkalemia: Secondary | ICD-10-CM

## 2022-05-16 DIAGNOSIS — I130 Hypertensive heart and chronic kidney disease with heart failure and stage 1 through stage 4 chronic kidney disease, or unspecified chronic kidney disease: Secondary | ICD-10-CM | POA: Diagnosis not present

## 2022-05-16 DIAGNOSIS — K449 Diaphragmatic hernia without obstruction or gangrene: Secondary | ICD-10-CM | POA: Diagnosis not present

## 2022-05-16 DIAGNOSIS — Z881 Allergy status to other antibiotic agents status: Secondary | ICD-10-CM

## 2022-05-16 DIAGNOSIS — Z885 Allergy status to narcotic agent status: Secondary | ICD-10-CM

## 2022-05-16 DIAGNOSIS — N1832 Chronic kidney disease, stage 3b: Secondary | ICD-10-CM | POA: Diagnosis present

## 2022-05-16 DIAGNOSIS — I2510 Atherosclerotic heart disease of native coronary artery without angina pectoris: Secondary | ICD-10-CM | POA: Diagnosis present

## 2022-05-16 DIAGNOSIS — Z90722 Acquired absence of ovaries, bilateral: Secondary | ICD-10-CM

## 2022-05-16 DIAGNOSIS — I5032 Chronic diastolic (congestive) heart failure: Secondary | ICD-10-CM | POA: Diagnosis present

## 2022-05-16 DIAGNOSIS — Z9049 Acquired absence of other specified parts of digestive tract: Secondary | ICD-10-CM

## 2022-05-16 DIAGNOSIS — Z951 Presence of aortocoronary bypass graft: Secondary | ICD-10-CM | POA: Diagnosis not present

## 2022-05-16 DIAGNOSIS — M542 Cervicalgia: Secondary | ICD-10-CM | POA: Diagnosis present

## 2022-05-16 DIAGNOSIS — D696 Thrombocytopenia, unspecified: Secondary | ICD-10-CM | POA: Diagnosis not present

## 2022-05-16 DIAGNOSIS — Z90710 Acquired absence of both cervix and uterus: Secondary | ICD-10-CM

## 2022-05-16 DIAGNOSIS — R079 Chest pain, unspecified: Secondary | ICD-10-CM

## 2022-05-16 DIAGNOSIS — Z7982 Long term (current) use of aspirin: Secondary | ICD-10-CM | POA: Diagnosis not present

## 2022-05-16 DIAGNOSIS — I5030 Unspecified diastolic (congestive) heart failure: Secondary | ICD-10-CM | POA: Diagnosis present

## 2022-05-16 DIAGNOSIS — J029 Acute pharyngitis, unspecified: Secondary | ICD-10-CM | POA: Diagnosis not present

## 2022-05-16 DIAGNOSIS — K219 Gastro-esophageal reflux disease without esophagitis: Secondary | ICD-10-CM | POA: Diagnosis not present

## 2022-05-16 DIAGNOSIS — R0789 Other chest pain: Secondary | ICD-10-CM | POA: Diagnosis not present

## 2022-05-16 DIAGNOSIS — E869 Volume depletion, unspecified: Secondary | ICD-10-CM | POA: Diagnosis present

## 2022-05-16 DIAGNOSIS — J168 Pneumonia due to other specified infectious organisms: Secondary | ICD-10-CM | POA: Diagnosis not present

## 2022-05-16 DIAGNOSIS — Z7960 Long term (current) use of unspecified immunomodulators and immunosuppressants: Secondary | ICD-10-CM | POA: Diagnosis not present

## 2022-05-16 DIAGNOSIS — R490 Dysphonia: Secondary | ICD-10-CM | POA: Diagnosis not present

## 2022-05-16 DIAGNOSIS — Z833 Family history of diabetes mellitus: Secondary | ICD-10-CM

## 2022-05-16 DIAGNOSIS — D631 Anemia in chronic kidney disease: Secondary | ICD-10-CM | POA: Diagnosis not present

## 2022-05-16 DIAGNOSIS — Z79899 Other long term (current) drug therapy: Secondary | ICD-10-CM

## 2022-05-16 DIAGNOSIS — K3189 Other diseases of stomach and duodenum: Secondary | ICD-10-CM | POA: Diagnosis not present

## 2022-05-16 DIAGNOSIS — M79601 Pain in right arm: Secondary | ICD-10-CM | POA: Diagnosis present

## 2022-05-16 DIAGNOSIS — N183 Chronic kidney disease, stage 3 unspecified: Secondary | ICD-10-CM | POA: Diagnosis present

## 2022-05-16 DIAGNOSIS — D84821 Immunodeficiency due to drugs: Secondary | ICD-10-CM | POA: Diagnosis not present

## 2022-05-16 LAB — URINALYSIS, ROUTINE W REFLEX MICROSCOPIC
Bilirubin Urine: NEGATIVE
Glucose, UA: NEGATIVE mg/dL
Hgb urine dipstick: NEGATIVE
Ketones, ur: NEGATIVE mg/dL
Leukocytes,Ua: NEGATIVE
Nitrite: NEGATIVE
Protein, ur: NEGATIVE mg/dL
Specific Gravity, Urine: 1.008 (ref 1.005–1.030)
pH: 6.5 (ref 5.0–8.0)

## 2022-05-16 LAB — COMPREHENSIVE METABOLIC PANEL
ALT: 7 U/L (ref 0–44)
AST: 14 U/L — ABNORMAL LOW (ref 15–41)
Albumin: 4 g/dL (ref 3.5–5.0)
Alkaline Phosphatase: 53 U/L (ref 38–126)
Anion gap: 8 (ref 5–15)
BUN: 35 mg/dL — ABNORMAL HIGH (ref 6–20)
CO2: 20 mmol/L — ABNORMAL LOW (ref 22–32)
Calcium: 9.2 mg/dL (ref 8.9–10.3)
Chloride: 107 mmol/L (ref 98–111)
Creatinine, Ser: 1.82 mg/dL — ABNORMAL HIGH (ref 0.44–1.00)
GFR, Estimated: 33 mL/min — ABNORMAL LOW (ref >60)
Glucose, Bld: 105 mg/dL — ABNORMAL HIGH (ref 70–99)
Potassium: 5.9 mmol/L — ABNORMAL HIGH (ref 3.5–5.1)
Sodium: 135 mmol/L (ref 135–145)
Total Bilirubin: 0.7 mg/dL (ref 0.3–1.2)
Total Protein: 7.2 g/dL (ref 6.5–8.1)

## 2022-05-16 LAB — POTASSIUM
Potassium: 6.1 mmol/L — ABNORMAL HIGH (ref 3.5–5.1)
Potassium: 4.7 mmol/L (ref 3.5–5.1)

## 2022-05-16 LAB — LACTIC ACID, PLASMA
Lactic Acid, Venous: 0.5 mmol/L (ref 0.5–1.9)
Lactic Acid, Venous: 0.3 mmol/L — ABNORMAL LOW (ref 0.5–1.9)

## 2022-05-16 LAB — CBC
HCT: 32 % — ABNORMAL LOW (ref 36.0–46.0)
Hemoglobin: 11.5 g/dL — ABNORMAL LOW (ref 12.0–15.0)
MCH: 33 pg (ref 26.0–34.0)
MCHC: 35.9 g/dL (ref 30.0–36.0)
MCV: 91.7 fL (ref 80.0–100.0)
Platelets: 127 10*3/uL — ABNORMAL LOW (ref 150–400)
RBC: 3.49 MIL/uL — ABNORMAL LOW (ref 3.87–5.11)
RDW: 12.7 % (ref 11.5–15.5)
WBC: 11.2 10*3/uL — ABNORMAL HIGH (ref 4.0–10.5)
nRBC: 0 % (ref 0.0–0.2)

## 2022-05-16 LAB — PREGNANCY, URINE: Preg Test, Ur: NEGATIVE

## 2022-05-16 LAB — TROPONIN I (HIGH SENSITIVITY)
Troponin I (High Sensitivity): 2 ng/L (ref <18)
Troponin I (High Sensitivity): 3 ng/L (ref <18)

## 2022-05-16 LAB — LIPASE, BLOOD: Lipase: 19 U/L (ref 11–51)

## 2022-05-16 MED ORDER — URSODIOL 300 MG PO CAPS
300.0000 mg | ORAL_CAPSULE | Freq: Every day | ORAL | Status: DC
  Administered 2022-05-17 – 2022-05-18 (×2): 300 mg via ORAL
  Filled 2022-05-16 (×2): qty 1

## 2022-05-16 MED ORDER — ENOXAPARIN SODIUM 40 MG/0.4ML IJ SOSY
40.0000 mg | PREFILLED_SYRINGE | INTRAMUSCULAR | Status: DC
  Administered 2022-05-16 – 2022-05-17 (×2): 40 mg via SUBCUTANEOUS
  Filled 2022-05-16 (×2): qty 0.4

## 2022-05-16 MED ORDER — ONDANSETRON HCL 4 MG/2ML IJ SOLN
4.0000 mg | Freq: Once | INTRAMUSCULAR | Status: AC
  Administered 2022-05-16: 4 mg via INTRAVENOUS
  Filled 2022-05-16: qty 2

## 2022-05-16 MED ORDER — SODIUM ZIRCONIUM CYCLOSILICATE 10 G PO PACK
10.0000 g | PACK | Freq: Once | ORAL | Status: AC
  Administered 2022-05-16: 10 g via ORAL
  Filled 2022-05-16: qty 1

## 2022-05-16 MED ORDER — PANTOPRAZOLE SODIUM 40 MG PO TBEC
40.0000 mg | DELAYED_RELEASE_TABLET | Freq: Two times a day (BID) | ORAL | Status: DC
  Administered 2022-05-16 – 2022-05-18 (×4): 40 mg via ORAL
  Filled 2022-05-16 (×4): qty 1

## 2022-05-16 MED ORDER — SODIUM BICARBONATE 8.4 % IV SOLN
50.0000 meq | Freq: Once | INTRAVENOUS | Status: AC
  Administered 2022-05-16: 50 meq via INTRAVENOUS
  Filled 2022-05-16: qty 50

## 2022-05-16 MED ORDER — ALBUTEROL SULFATE (2.5 MG/3ML) 0.083% IN NEBU: 10.0000 mg | INHALATION_SOLUTION | Freq: Once | RESPIRATORY_TRACT | Status: DC

## 2022-05-16 MED ORDER — SODIUM CHLORIDE 0.9 % IV BOLUS
500.0000 mL | Freq: Once | INTRAVENOUS | Status: AC
  Administered 2022-05-16: 500 mL via INTRAVENOUS

## 2022-05-16 MED ORDER — CARVEDILOL 12.5 MG PO TABS
12.5000 mg | ORAL_TABLET | Freq: Two times a day (BID) | ORAL | Status: DC
  Administered 2022-05-16 – 2022-05-18 (×4): 12.5 mg via ORAL
  Filled 2022-05-16 (×4): qty 1

## 2022-05-16 MED ORDER — ASPIRIN 81 MG PO TBEC
81.0000 mg | DELAYED_RELEASE_TABLET | Freq: Every day | ORAL | Status: DC
  Administered 2022-05-17 – 2022-05-18 (×2): 81 mg via ORAL
  Filled 2022-05-16 (×2): qty 1

## 2022-05-16 MED ORDER — IOHEXOL 350 MG/ML SOLN
100.0000 mL | Freq: Once | INTRAVENOUS | Status: AC | PRN
  Administered 2022-05-16: 75 mL via INTRAVENOUS

## 2022-05-16 MED ORDER — SODIUM CHLORIDE 0.9 % IV SOLN
2.0000 g | Freq: Two times a day (BID) | INTRAVENOUS | Status: DC
  Administered 2022-05-17 – 2022-05-18 (×3): 2 g via INTRAVENOUS
  Filled 2022-05-16 (×3): qty 12.5

## 2022-05-16 MED ORDER — SODIUM CHLORIDE 0.9% FLUSH
10.0000 mL | Freq: Two times a day (BID) | INTRAVENOUS | Status: DC
  Administered 2022-05-16 – 2022-05-18 (×4): 10 mL

## 2022-05-16 MED ORDER — SODIUM CHLORIDE 0.9 % IV SOLN
2.0000 g | Freq: Once | INTRAVENOUS | Status: AC
  Administered 2022-05-16: 2 g via INTRAVENOUS
  Filled 2022-05-16: qty 12.5

## 2022-05-16 MED ORDER — VANCOMYCIN HCL IN DEXTROSE 1-5 GM/200ML-% IV SOLN: 1000.0000 mg | INTRAVENOUS | Status: DC

## 2022-05-16 MED ORDER — URSODIOL 300 MG PO CAPS
600.0000 mg | ORAL_CAPSULE | Freq: Every day | ORAL | Status: DC
  Administered 2022-05-16 – 2022-05-17 (×2): 600 mg via ORAL
  Filled 2022-05-16 (×3): qty 2

## 2022-05-16 MED ORDER — CHLORHEXIDINE GLUCONATE CLOTH 2 % EX PADS
6.0000 | MEDICATED_PAD | Freq: Every day | CUTANEOUS | Status: DC
  Administered 2022-05-17 – 2022-05-18 (×2): 6 via TOPICAL

## 2022-05-16 MED ORDER — TACROLIMUS ER 4 MG PO TB24
4.0000 mg | ORAL_TABLET | Freq: Every day | ORAL | Status: DC
  Administered 2022-05-17 – 2022-05-18 (×2): 4 mg via ORAL
  Filled 2022-05-16 (×2): qty 1

## 2022-05-16 MED ORDER — ROSUVASTATIN CALCIUM 5 MG PO TABS
5.0000 mg | ORAL_TABLET | Freq: Every day | ORAL | Status: DC
  Administered 2022-05-16 – 2022-05-17 (×2): 5 mg via ORAL
  Filled 2022-05-16 (×2): qty 1

## 2022-05-16 MED ORDER — SODIUM CHLORIDE 0.9% FLUSH: 10.0000 mL | INTRAVENOUS | Status: DC | PRN

## 2022-05-16 MED ORDER — SUCRALFATE 1 G PO TABS
1.0000 g | ORAL_TABLET | Freq: Two times a day (BID) | ORAL | Status: DC
  Administered 2022-05-16 – 2022-05-18 (×4): 1 g via ORAL
  Filled 2022-05-16 (×4): qty 1

## 2022-05-16 MED ORDER — HYDROMORPHONE HCL 1 MG/ML IJ SOLN
0.5000 mg | Freq: Once | INTRAMUSCULAR | Status: AC
  Administered 2022-05-16: 0.5 mg via INTRAVENOUS
  Filled 2022-05-16: qty 1

## 2022-05-16 MED ORDER — VANCOMYCIN HCL IN DEXTROSE 1-5 GM/200ML-% IV SOLN
1000.0000 mg | Freq: Once | INTRAVENOUS | Status: AC
  Administered 2022-05-16: 1000 mg via INTRAVENOUS
  Filled 2022-05-16: qty 200

## 2022-05-16 MED ORDER — LOPERAMIDE HCL 2 MG PO CAPS
2.0000 mg | ORAL_CAPSULE | Freq: Four times a day (QID) | ORAL | Status: DC | PRN
  Administered 2022-05-17: 2 mg via ORAL
  Filled 2022-05-16: qty 1

## 2022-05-16 NOTE — Treatment Plan (Signed)
52 yo with hx liver transplant 10/18/19 (appears she's on [facility]arsus based on home med list), CKD IIIB, hx CABG, pancytopenia who presented to the ED with chest pain and dyspnea on exertion.  Imaging is notable for RUL opacity, concerning for infection vs lymphoproliferative process.  Vitals are notable for hypertension, intermittent tachypnea and tachycardia.  Labs are notable for hyperkalemia and mild leukocytosis, thrombocytopenia, and anemia.  Troponins negative x2.  CT dissection protocol notable for RUL opacity.  CT neck without concerning findings.  EKG with NSR and IVCD.  EDP requesting admission for possible pneumonia.  Recently had course of augmentin for possible dental infection.  S/p vanc/cefepime in ED given her immunosuppressed status.   Will accept to telemetry at [facility] or [facility].  Plan to continue treatment for pneumonia, eval for other possible causes of her presentation.  Requested resp panel and repeat K (s/p IVF, bicarb, lokelma).

## 2022-05-16 NOTE — Assessment & Plan Note (Signed)
-  appears more volume depleted on exam -continue Coreg

## 2022-05-16 NOTE — ED Triage Notes (Signed)
Pt reports chest pain and bilateral arm pain that started this morning while at work. Pt also reports SHOB while walking. Pt reports CABG in 2019.

## 2022-05-16 NOTE — ED Provider Notes (Signed)
[facility] Provider Note   CSN: [id number] Arrival date & time: 05/16/22  1118     History  Chief Complaint  Patient presents with   Chest Pain    Christine Butler is a 52 y.o. female, history of liver transplant, CABG, who presents to the ED secondary to left neck pain, swelling that happened about a week ago, was put on Augmentin, with resolution of symptoms, but now has developed severe pain in bilateral arms, and chest discomfort.  She states that chest discomfort is pressurized in nature 7 out of 10, and that she has severe throbbing pain in her arms.  She notes that they feel weaker as well.  Feels very fatigued, and like unable to do her work.  Notes that she is also short of breath when walking and she noticed a voice change yesterday.  Is compliant with her immunosuppressive's.  Denies any fever, chills.  Denies any runny nose or sore throat.    Home Medications Prior to Admission medications   Medication Sig Start Date End Date Taking? Authorizing Provider  acetaminophen (TYLENOL) 500 MG tablet Take 1,000 mg by mouth every 8 (eight) hours as needed for mild pain.    [provider]  amoxicillin-clavulanate (AUGMENTIN) 875-125 MG tablet Take 1 tablet by mouth every 12 (twelve) hours with food until finished 05/06/22   Marta Lamas, DMD  ASPIRIN LOW DOSE 81 MG EC tablet Take 81 mg by mouth daily. 11/23/19   [provider]  Biotin 5 MG CAPS Take 5 mg by mouth daily in the afternoon.    [provider]  Calcium Citrate (CITRACAL PO) Take 1,200 mg by mouth daily.    [provider]  carvedilol (COREG) 25 MG tablet Take 1/2 tablet (12.5 mg) by mouth 2 times daily with a meal. 02/06/22   Revankar, Reita Cliche, MD  cycloSPORINE (RESTASIS) 0.05 % ophthalmic emulsion Place 1 drop into both eyes at bedtime.  11/24/18   [provider]  ENVARSUS XR 4 MG TB24 Take 1 tablet (4 mg total) by mouth in the morning.  03/25/22     estradiol (VIVELLE-DOT) 0.05 MG/24HR patch Apply 1 patch twice a week 06/20/21     finasteride (PROSCAR) 5 MG tablet Take 1 tablet (5 mg total) by mouth daily. Patient taking differently: Take 2.5 mg by mouth every evening. 04/19/21     fluticasone (FLONASE) 50 MCG/ACT nasal spray Place 1 spray into both nostrils daily as needed for allergies or rhinitis.     [provider]  loperamide (IMODIUM A-D) 2 MG tablet Take 2 mg by mouth 4 (four) times daily as needed for diarrhea or loose stools.    [provider]  metroNIDAZOLE (METROCREAM) 0.75 % cream Apply 1 Application topically to affected area 2 (two) times daily. 04/29/22     nitroGLYCERIN (NITROSTAT) 0.4 MG SL tablet Place 0.4 mg under the tongue every 5 (five) minutes as needed for chest pain.    [provider]  pantoprazole (PROTONIX) 40 MG tablet Take 1 tablet (40 mg total) by mouth 2 (two) times daily. 02/28/22     polyethylene glycol-electrolytes (NULYTELY) 420 g solution Use as directed 02/28/22     rosuvastatin (CRESTOR) 5 MG tablet Take 1 tablet (5 mg total) by mouth daily. 12/11/21 12/06/22  Hilty, Nadean Corwin, MD  sucralfate (CARAFATE) 1 g tablet Take 1 tablet (1 g total) by mouth 4 (four) times daily on an empty stomach for  14 days 02/28/22     ursodiol (ACTIGALL) 300 MG capsule Take 1 capsule by mouth 3 times a day. 04/11/22         Allergies    Codeine, Erythromycin, and Fentanyl    Review of Systems   Review of Systems  Constitutional:  Negative for fever.  Respiratory:  Positive for shortness of breath.   Cardiovascular:  Positive for chest pain.    Physical Exam Updated Vital Signs BP 105/76   Pulse 71   Temp 97.7 F (36.5 C) (Oral)   Resp (!) 22   LMP 06/24/2017   SpO2 98%  Physical Exam Vitals and nursing note reviewed.  Constitutional:      General: She is not in acute distress.    Appearance: She is well-developed.  HENT:     Head: Normocephalic and atraumatic.  Eyes:      Conjunctiva/sclera: Conjunctivae normal.  Cardiovascular:     Rate and Rhythm: Normal rate and regular rhythm.     Pulses:          Radial pulses are 2+ on the right side and 2+ on the left side.     Heart sounds: No murmur heard. Pulmonary:     Effort: Pulmonary effort is normal. No respiratory distress.     Breath sounds: Normal breath sounds.  Abdominal:     Palpations: Abdomen is soft.     Tenderness: There is no abdominal tenderness.  Musculoskeletal:        General: No swelling.     Cervical back: Neck supple.     Comments: No focal deficits or weakness, good grip strength.  5 out of 5 bilateral upper and bilateral lower extremities.  Skin:    General: Skin is warm and dry.     Capillary Refill: Capillary refill takes less than 2 seconds.  Neurological:     Mental Status: She is alert.  Psychiatric:        Mood and Affect: Mood normal.     ED Results / Procedures / Treatments   Labs (all labs ordered are listed, but only abnormal results are displayed) Labs Reviewed  CBC - Abnormal; Notable for the following components:      Result Value   WBC 11.2 (*)    RBC 3.49 (*)    Hemoglobin 11.5 (*)    HCT 32.0 (*)    Platelets 127 (*)    All other components within normal limits  COMPREHENSIVE METABOLIC PANEL - Abnormal; Notable for the following components:   Potassium 5.9 (*)    CO2 20 (*)    Glucose, Bld 105 (*)    BUN 35 (*)    Creatinine, Ser 1.82 (*)    AST 14 (*)    GFR, Estimated 33 (*)    All other components within normal limits  LACTIC ACID, PLASMA - Abnormal; Notable for the following components:   Lactic Acid, Venous 0.3 (*)    All other components within normal limits  POTASSIUM - Abnormal; Notable for the following components:   Potassium 6.1 (*)    All other components within normal limits  URINALYSIS, ROUTINE W REFLEX MICROSCOPIC - Abnormal; Notable for the following components:   Color, Urine COLORLESS (*)    All other components within normal  limits  CULTURE, BLOOD (ROUTINE X 2)  CULTURE, BLOOD (ROUTINE X 2)  RESPIRATORY PANEL BY PCR  LACTIC ACID, PLASMA  LIPASE, BLOOD  PREGNANCY, URINE  POTASSIUM  TROPONIN I (HIGH SENSITIVITY)  TROPONIN  I (HIGH SENSITIVITY)    EKG EKG Interpretation  Date/Time:  Friday May 16 2022 11:29:37 EDT Ventricular Rate:  74 PR Interval:  147 QRS Duration: 116 QT Interval:  411 QTC Calculation: 456 R Axis:   85 Text Interpretation: Sinus rhythm Nonspecific intraventricular conduction delay ST changes improved Confirmed by Ezequiel Essex [phone]) on 05/16/2022 11:52:38 AM  Radiology CT Soft Tissue Neck Wo Contrast  Result Date: 05/16/2022 CLINICAL DATA:  Hoarseness, normal laryngeal exam. EXAM: CT NECK WITHOUT CONTRAST TECHNIQUE: Multidetector CT imaging of the neck was performed following the standard protocol without intravenous contrast. RADIATION DOSE REDUCTION: This exam was performed according to the departmental dose-optimization program which includes automated exposure control, adjustment of the mA and/or kV according to patient size and/or use of iterative reconstruction technique. COMPARISON:  None Available. FINDINGS: Pharynx and larynx: Normal. No mass or swelling. Salivary glands: No inflammation, mass, or stone. Thyroid: Normal. Lymph nodes: No suspicious cervical lymphadenopathy. Vascular: Atherosclerotic calcifications of the carotid bulbs. Limited intracranial: Unremarkable. Visualized orbits: Normal. Mastoids and visualized paranasal sinuses: Well aerated. Skeleton: No suspicious bone lesions. Upper chest: Please refer to same-day CTA chest report. Other: None. IMPRESSION: No neck mass, fluid collection or suspicious cervical lymphadenopathy. Electronically Signed   By: Emmit Alexanders M.D.   On: 05/16/2022 15:07   CT Angio Chest/Abd/Pel for Dissection W and/or Wo Contrast  Result Date: 05/16/2022 CLINICAL DATA:  Chest pain EXAM: CT ANGIOGRAPHY CHEST, ABDOMEN AND PELVIS  TECHNIQUE: Non-contrast CT of the chest was initially obtained. Multidetector CT imaging through the chest, abdomen and pelvis was performed using the standard protocol during bolus administration of intravenous contrast. Multiplanar reconstructed images and MIPs were obtained and reviewed to evaluate the vascular anatomy. RADIATION DOSE REDUCTION: This exam was performed according to the departmental dose-optimization program which includes automated exposure control, adjustment of the mA and/or kV according to patient size and/or use of iterative reconstruction technique. CONTRAST:  53mL OMNIPAQUE IOHEXOL 350 MG/ML SOLN COMPARISON:  X-ray chest 05/16/2022 earlier. CT abdomen pelvis 04/01/2021. FINDINGS: CTA CHEST FINDINGS Cardiovascular: Patient is status post median sternotomy. The heart is nonenlarged. No pericardial effusion. Right upper chest port which is accessed. The ascending aorta at the level of the right pulmonary artery has a diameter of 3.0 x 3.1 cm. The descending thoracic aorta measures 2.2 x 2.1 cm. Aortic arch has a diameter of 2.5 cm. The ascending aorta just distal to the aortic root has a diameter of 2.4 cm. Slight atherosclerotic changes. The origin of the great vessels are preserved. No dissection or aneurysm formation. On the noncontrast dataset no intramural hematoma clearly seen along the thoracic aorta. Mediastinum/Nodes: No specific abnormal lymph node enlargement seen in the axillary region, hilum or mediastinum. Shoichi Mielke hiatal hernia. Normal caliber thoracic esophagus. Preserved thyroid gland. Lungs/Pleura: Left lung is clear. No consolidation, pneumothorax. There is breathing motion. Right lung is also without pleural effusion or pneumothorax. There is a infiltrative area in the right upper lobe with some air bronchograms. Acute infiltrate is possible such as pneumonia. With the history of a liver transplant in the patient being potential immunosuppressed, neoplasm is also in the  differential. Recommend follow-up. This was seen on chest x-ray. Musculoskeletal: Slight curvature of the spine. Chronic right-sided rib deformities are noted. Review of the MIP images confirms the above findings. CTA ABDOMEN AND PELVIS FINDINGS VASCULAR Aorta: Slight atherosclerotic calcified plaque. No dissection or aneurysm formation. Celiac: Standard branching pattern. There are some surgical changes along the common hepatic artery at the  liver hilum consistent with anastomosis for transplant. No definite stricture. SMA: Patent without evidence of aneurysm, dissection, vasculitis or significant stenosis. Renals: Single left and 2 right renal arteries are identified IMA: Patent without evidence of aneurysm, dissection, vasculitis or significant stenosis. Inflow: Slight atherosclerotic changes. No dissection or aneurysm formation. Veins: No obvious venous abnormality within the limitations of this arterial phase study. Review of the MIP images confirms the above findings. NON-VASCULAR Hepatobiliary: With the limits of the early phase of the arterial bolus, liver is grossly preserved. Again patient has a history of liver transplant. No clear hypervascular liver lesion. Previous biliary stents are no longer identified. Gallbladder is not seen. Pancreas: Unremarkable. No pancreatic ductal dilatation or surrounding inflammatory changes. Spleen: Preserved splenic enhancement.  No mass lesion. Adrenals/Urinary Tract: The adrenal glands are preserved. Enhancing renal mass or collecting system dilatation. The ureters have normal course and caliber extending down to the bladder. Preserved contours of the urinary bladder. Bladder is distended. Stomach/Bowel: Moderate colonic stool. Normal appendix. Large and Keola Heninger bowel are nondilated. The stomach is nondilated. Lymphatic: No specific abnormal lymph node enlargement seen in the abdomen and pelvis. Reproductive: Status post hysterectomy. No adnexal masses. Other: Rishit Burkhalter fat  containing umbilical hernia. Slight areas of subcutaneous fat stranding, nonspecific. Evellyn Tuff epigastric midline anterior abdominal wall fat containing hernia as well. Musculoskeletal: Scattered degenerative changes of the spine and pelvis. Schmorl's node deformity seen along the superior endplate of L2 and L5. Appearance is unchanged from prior Review of the MIP images confirms the above findings. IMPRESSION: Scattered mild atherosclerotic calcified plaque and noncalcified plaque. No dissection or aneurysm formation. Postop chest.  Right upper chest port. Surgical changes from liver transplant. As seen on the x-ray there is a right upper lobe area of parenchymal lung opacity. With patient's history possibilities would include infection. If the patient is immunosuppressed this also could be lymphoproliferative process and recommend correlation with specific symptoms and follow-up. Electronically Signed   By: Jill Side M.D.   On: 05/16/2022 15:00   DG Chest 2 View  Result Date: 05/16/2022 CLINICAL DATA:  Chest pain. EXAM: CHEST - 2 VIEW COMPARISON:  Chest radiographs 09/04/2021 and 09/19/2019 FINDINGS: Status post median sternotomy and CABG. Right chest wall porta catheter tip again overlies the superior aspect of the right atrium, unchanged. Thereis subtle pulmonary artery/interstitial thickening seen within the mid to upper right lung that is new from 08/27/2021. The left lung is clear. No pleural effusion pneumothorax. Mild multilevel degenerative disc changes of the thoracic spine. IMPRESSION: Subtle pulmonary artery/interstitial thickening within the mid to upper right lung that is new from 08/27/2021. This may represent early pneumonia. Recommend clinical correlation. Asymmetric minimal interstitial pulmonary edema is felt less likely. Electronically Signed   By: Yvonne Kendall M.D.   On: 05/16/2022 12:10    Procedures Procedures    Medications Ordered in ED Medications  albuterol (PROVENTIL) (2.5  MG/3ML) 0.083% nebulizer solution 10 mg (has no administration in time range)  HYDROmorphone (DILAUDID) injection 0.5 mg (0.5 mg Intravenous Given 05/16/22 1211)  ondansetron (ZOFRAN) injection 4 mg (4 mg Intravenous Given 05/16/22 1223)  sodium chloride 0.9 % bolus 500 mL ( Intravenous Stopped 05/16/22 1351)  sodium zirconium cyclosilicate (LOKELMA) packet 10 g (10 g Oral Given 05/16/22 1435)  iohexol (OMNIPAQUE) 350 MG/ML injection 100 mL (75 mLs Intravenous Contrast Given 05/16/22 1415)  vancomycin (VANCOCIN) IVPB 1000 mg/200 mL premix (0 mg Intravenous Stopped 05/16/22 1756)  ceFEPIme (MAXIPIME) 2 g in sodium chloride 0.9 % 100  mL IVPB (0 g Intravenous Stopped 05/16/22 1611)  sodium bicarbonate injection 50 mEq (50 mEq Intravenous Given 05/16/22 1531)    ED Course/ Medical Decision Making/ A&P                             Medical Decision Making Patient is a 52 year old female, complaining of chest discomfort radiating to bilateral arms.  We will obtain a CT dissection study to evaluate for possible dissection.  Additionally obtain CT soft tissue given voice change, inability to use IV contrast given need for contrast for dissection study.  Additionally will obtain labs blood cultures, given immunosuppressed.  Additionally lactic acid.  Will start with pain medication initially.  Discussed with Dr. Wyvonnia Dusky he will evaluate patient.  Amount and/or Complexity of Data Reviewed Labs: ordered.    Details: Potassium of 5.9, repeat potassium 6.1, mild leukocytosis of 11.2.  Elevated creatinine.  Within normal limits lactic acid. Radiology: ordered.    Details: CTA concerning for right upper lobe pneumonia, versus lymphoproliferative process. Discussion of management or test interpretation with external provider(s): Spoke with Dr. Florene Glen hospitalist, he accepts patient for admission.  Patient be treated for right upper lobe pneumonia started on ceftriaxone, requires admission secondary to immunosuppressed  status.  Blood cultures ordered.  Repeat potassium 4.1, after Lokelma given an albuterol as well as bicarb.  Risk Prescription drug management. Decision regarding hospitalization.    Final Clinical Impression(s) / ED Diagnoses Final diagnoses:  Pneumonia due to infectious organism, unspecified laterality, unspecified part of lung  Hyperkalemia  Chest pain, unspecified type    Rx / DC Orders ED Discharge Orders     None         Osvaldo Shipper, PA 05/16/22 1912    Ezequiel Essex, MD 05/17/22 1723

## 2022-05-16 NOTE — Assessment & Plan Note (Signed)
-  administered bicarb, Lokelma and albuterol in ED  -follow repeat potassium in the morning

## 2022-05-16 NOTE — ED Notes (Signed)
Report given to IP RN receiving Pt. 

## 2022-05-16 NOTE — ED Notes (Signed)
Called [facility] to transport patient to [facility] rm# [phone]

## 2022-05-16 NOTE — H&P (Signed)
History and Physical    Patient: Christine Butler [id number] DOB: Dec 13, 1970 DOA: 05/16/2022 DOS: the patient was seen and examined on 05/16/2022 PCP: Scifres, Durel Salts (Inactive)  Patient coming from:  [place] ED  Chief Complaint:  Chief Complaint  Patient presents with   Chest Pain   HPI: Christine Butler is a 52 y.o. 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   About 2 weeks ago she noticed left sided facial swelling. Thought it was a dental infection and saw her dentist. However no infection was found but was given course of a week of Augmentin just in case. Finished about 3 days ago. Swelling on face improved but felt it went down to her neck. Had sore throat and deep cough. Today had chills and felt weak with shortness of breath with exertion Had chest pain but notes it was more around her bilateral clavicles.   In the ED, afebrile, BP up to 188/92 on room air.   Has leukocytosis of 11.2, hemoglobin around baseline 11.5, thrombocytopenia of 127  Na of 135, hyperkalemia of 5.9, co2 of 20, creatinine elevated at 1.82 but has been anywhere from 1.3-1.8 in the past year.   UA was negative.   CXR with possible pneumonia to right upper lung.  CTA chest/Abd/Pelvis without dissection and confirmed right upper lobe infiltrate concerning for infection vs lymphoproliferative process.   She was started on IV vancomycin and Cefepime due to being on immunosuppressive. Also given bicarb, Lokelma and albuterol for hyperkalemia.   Hospitalist then requested for transfer for further management.   Review of Systems: As mentioned in the history of present illness. All other systems reviewed and are negative. Past Medical History:  Diagnosis Date   (HFpEF) heart failure with preserved  ejection fraction ([place]) 08/09/2019   Acute blood loss anemia 04/20/2017   Altered mental status 08/14/2019   Arthritis    right knee   Ascites--mild this admit 11/06/2017   Atypical chest pain 11/05/2017   Atypical squamous cells of undetermined significance (ASCUS) on Papanicolaou smear of cervix 08/05/2018   CAD (coronary artery disease)    a. s/p CABGx2 in 02/2017 (LAD not suitable for PCI), EF normal.   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   Chronic low back pain    Cirrhosis ([place]) 03/16/2017   Coronary artery disease 03/24/2017   COVID-19 09/18/2020   Current episode of major depressive disorder without prior episode 03/13/2020   Dyslipidemia, goal LDL below 70 10/13/2017   Elevated LFTs 04/20/2017   Encephalopathy 09/29/2019   hepatic encephalopathy   Familial hyperlipidemia    GERD (gastroesophageal reflux disease)    GI bleed 02/01/2018   H/O LEEP 03/30/2019   H/O two vessel coronary artery bypass graft 03/30/2019   High grade squamous intraepithelial lesion (HGSIL) on cytologic smear of cervix 05/04/2019   History of gastrostomy, has currently [facility]) 07/12/2020   Formatting of this note might be different from the original. Patient underwent ERCP 07/11/20. Procedure c/b hepaticogastrostomy stent placement.   Hypertension    Hypo-osmolality and hyponatremia 03/30/2019   IBS (irritable bowel syndrome)    Other insomnia 09/05/2019   Pancytopenia ([place]) 09/28/2019   PONV (postoperative nausea and vomiting)  i always throw up on waking up , but last EGD in march had no issues     PONV (postoperative nausea and vomiting)    likes zofran and steroid to help does not like scopolamine patch   Port-A-Cath in place 11/21/2019   Primary biliary cholangitis ([place]) 07/26/2012   Formatting of this note might be different from the original. IMOUPDATE   Primary biliary cirrhosis ([place])     cirhosis/liver disease followed by transplant team led by Dr Manuella Ghazi at [place]    S/P CABG (coronary artery bypass graft)    S/P CABG x 2 03/24/2017   LIMA to DIAGONAL Portion of SVG/LEFT RADIAL to LAD   S/P LEEP (loop electrosurgical excision procedure) 05/11/2019   S/P TAH-BSO 05/13/2021   Secondary generalized osteoporosis 11/19/2021   Status post liver transplantation ([place]) 03/30/2019   SVD (spontaneous vaginal delivery)    x 3   Upper GI bleed 02/01/2018   Urinary incontinence    occ   Urinary tract bacterial infections last oct 2021   Wears glasses    Past Surgical History:  Procedure Laterality Date   CERVICAL CONIZATION W/BX N/A 03/22/2020   Procedure: CONIZATION CERVIX WITH BIOPSY;  Surgeon: Dian Queen, MD;  Location: [place];  Service: Gynecology;  Laterality: N/A;   CHOLECYSTECTOMY  09/2019   COLPOSCOPY N/A 03/22/2020   Procedure: COLPOSCOPY;  Surgeon: Dian Queen, MD;  Location: [facility];  Service: Gynecology;  Laterality: N/A;   CORONARY ARTERY BYPASS GRAFT N/A 03/24/2017   Procedure: CORONARY ARTERY BYPASS GRAFTING (CABG) x two, using left internal mammary artery, left radial artery, and right leg greater saphenous vein harvested endoscopically;  Surgeon: Ivin Poot, MD;  Location: [place];  Service: Open Heart Surgery;  Laterality: N/A;   ENDOVEIN HARVEST OF GREATER SAPHENOUS VEIN Right 03/24/2017   Procedure: ENDOVEIN HARVEST OF GREATER SAPHENOUS VEIN;  Surgeon: Ivin Poot, MD;  Location: [place];  Service: Open Heart Surgery;  Laterality: Right;   ESOPHAGEAL BANDING  02/01/2018   Procedure: ESOPHAGEAL BANDING;  Surgeon: [name], MD;  Location: [facility];  Service: Gastroenterology;;   ESOPHAGEAL BANDING N/A 03/29/2018   Procedure: ESOPHAGEAL BANDING;  Surgeon: [name], MD;  Location: [facility];  Service: Gastroenterology;  Laterality: N/A;   ESOPHAGEAL BANDING N/A 05/21/2018   Procedure: ESOPHAGEAL BANDING;   Surgeon: [name], MD;  Location: [facility];  Service: Gastroenterology;  Laterality: N/A;   ESOPHAGEAL BANDING N/A 10/11/2018   Procedure: ESOPHAGEAL BANDING;  Surgeon: [name], MD;  Location: [facility];  Service: Gastroenterology;  Laterality: N/A;   ESOPHAGOGASTRODUODENOSCOPY N/A 03/29/2018   Procedure: ESOPHAGOGASTRODUODENOSCOPY (EGD);  Surgeon: [name], MD;  Location: [facility];  Service: Gastroenterology;  Laterality: N/A;   ESOPHAGOGASTRODUODENOSCOPY N/A 05/21/2018   Procedure: ESOPHAGOGASTRODUODENOSCOPY (EGD);  Surgeon: [name], MD;  Location: [facility];  Service: Gastroenterology;  Laterality: N/A;   ESOPHAGOGASTRODUODENOSCOPY (EGD) WITH PROPOFOL N/A 02/01/2018   Procedure: ESOPHAGOGASTRODUODENOSCOPY (EGD) WITH PROPOFOL;  Surgeon: [name], MD;  [facility]: [facility];  Service: Gastroenterology;  Laterality: N/A;   ESOPHAGOGASTRODUODENOSCOPY (EGD) WITH PROPOFOL N/A 10/11/2018   Procedure: ESOPHAGOGASTRODUODENOSCOPY (EGD) WITH PROPOFOL;  Surgeon: [name], MD;  Location: [facility];  Service: Gastroenterology;  Laterality: N/A;   HYSTERECTOMY ABDOMINAL WITH SALPINGO-OOPHORECTOMY Bilateral 05/13/2021   Procedure: TOTAL ABDOMINAL HYSTERECTOMY, BILATERAL SALPINGO OPPHORECTOMY;  Surgeon: Dian Queen, MD;  Location: [place];  Service: Gynecology;  Laterality: Bilateral;   INCONTINENCE SURGERY  2009   urinary  bladder sling  IR IMAGING GUIDED PORT INSERTION  04/08/2019   LEEP N/A 03/28/2014   Procedure: LOOP ELECTROSURGICAL EXCISION PROCEDURE (LEEP) cone biopsy;  Surgeon: Cyril Mourning, MD;  Location: [facility];  Service: Gynecology;  Laterality: N/A;   LEEP N/A 03/22/2020   Procedure: LOOP ELECTROSURGICAL EXCISION PROCEDURE (LEEP);  Surgeon: Dian Queen, MD;  Location[facility];  Service: Gynecology;  Laterality: N/A;   LEFT HEART CATH AND CORONARY ANGIOGRAPHY N/A 03/23/2017   Procedure: LEFT HEART CATH AND CORONARY ANGIOGRAPHY;  Surgeon:  Jettie Booze, MD;  Location: [place] [facility];  Service: Cardiovascular;  Laterality: N/A;   LIVER BIOPSY     x 2   LIVER TRANSPLANT  08/ 24/2021   RADIAL ARTERY HARVEST Left 03/24/2017   Procedure: RADIAL ARTERY HARVEST;  Surgeon: Ivin Poot, MD;  Location: [place];  Service: Open Heart Surgery;  Laterality: Left;   TEE WITHOUT CARDIOVERSION N/A 03/24/2017   Procedure: TRANSESOPHAGEAL ECHOCARDIOGRAM (TEE);  Surgeon: Prescott Gum, Collier Salina, MD;  Location: [place];  Service: Open Heart Surgery;  Laterality: N/A;   WISDOM TOOTH EXTRACTION  yrs ago   Social History:  reports that she has never smoked. She has never used smokeless tobacco. She reports that she does not currently use alcohol after a past usage of about 1.0 standard drink of alcohol per week. She reports that she does not use drugs.  Allergies  Allergen Reactions   Codeine Nausea And Vomiting    Has taken this medication since liver transplant without excessive nausea and vomiting   Erythromycin Nausea And Vomiting   Fentanyl Nausea And Vomiting    Has taken this medication since liver transplant without excessive nausea and vomiting    Family History  Problem Relation Age of Onset   CAD Father    Diabetes Mellitus II Father    Heart disease Father    Heart attack Brother    Heart disease Maternal Aunt    Heart attack Paternal Grandmother    Heart attack Paternal Grandfather     Prior to Admission medications   Medication Sig Start Date End Date Taking? Authorizing Provider  acetaminophen (TYLENOL) 500 MG tablet Take 1,000 mg by mouth every 8 (eight) hours as needed for mild pain.    [provider]  amoxicillin-clavulanate (AUGMENTIN) 875-125 MG tablet Take 1 tablet by mouth every 12 (twelve) hours with food until finished 05/06/22   Marta Lamas, DMD  ASPIRIN LOW DOSE 81 MG EC tablet Take 81 mg by mouth daily. 11/23/19   [provider]  Biotin 5 MG CAPS Take 5 mg by mouth daily in the afternoon.     [provider]  Calcium Citrate (CITRACAL PO) Take 1,200 mg by mouth daily.    [provider]  carvedilol (COREG) 25 MG tablet Take 1/2 tablet (12.5 mg) by mouth 2 times daily with a meal. 02/06/22   Revankar, Reita Cliche, MD  cycloSPORINE (RESTASIS) 0.05 % ophthalmic emulsion Place 1 drop into both eyes at bedtime.  11/24/18   [provider]  ENVARSUS XR 4 MG TB24 Take 1 tablet (4 mg total) by mouth in the morning. 03/25/22     estradiol (VIVELLE-DOT) 0.05 MG/24HR patch Apply 1 patch twice a week 06/20/21     finasteride (PROSCAR) 5 MG tablet Take 1 tablet (5 mg total) by mouth daily. Patient taking differently: Take 2.5 mg by mouth every evening. 04/19/21     fluticasone (FLONASE) 50 MCG/ACT nasal spray Place 1  spray into both nostrils daily as needed for allergies or rhinitis.     [provider]  loperamide (IMODIUM A-D) 2 MG tablet Take 2 mg by mouth 4 (four) times daily as needed for diarrhea or loose stools.    [provider]  metroNIDAZOLE (METROCREAM) 0.75 % cream Apply 1 Application topically to affected area 2 (two) times daily. 04/29/22     nitroGLYCERIN (NITROSTAT) 0.4 MG SL tablet Place 0.4 mg under the tongue every 5 (five) minutes as needed for chest pain.    [provider]  pantoprazole (PROTONIX) 40 MG tablet Take 1 tablet (40 mg total) by mouth 2 (two) times daily. 02/28/22     polyethylene glycol-electrolytes (NULYTELY) 420 g solution Use as directed 02/28/22     rosuvastatin (CRESTOR) 5 MG tablet Take 1 tablet (5 mg total) by mouth daily. 12/11/21 12/06/22  Hilty, Nadean Corwin, MD  sucralfate (CARAFATE) 1 g tablet Take 1 tablet (1 g total) by mouth 4 (four) times daily on an empty stomach for 14 days 02/28/22     ursodiol (ACTIGALL) 300 MG capsule Take 1 capsule by mouth 3 times a day. 04/11/22       Physical Exam: Vitals:   05/16/22 1400 05/16/22 1529 05/16/22 1941 05/16/22 1952  BP: 105/76  (!) 160/89   Pulse: 71  73   Resp: (!)  22  16   Temp:  97.7 F (36.5 C) 98.6 F (37 C)   TempSrc:  Oral Oral   SpO2: 98%  100%   Weight:    79.4 kg  Height:    5' 1.5" (1.562 m)   Constitutional: NAD, calm, comfortable, mildly ill appearing middle age female with generalized pallor Eyes: lids and conjunctivae normal ENMT: Mucous membranes are moist.  Neck: normal, supple Respiratory: clear to auscultation bilaterally, no wheezing, no crackles. Normal respiratory effort. No accessory muscle use. On room air. Cardiovascular: Regular rate and rhythm, no murmurs / rubs / gallops. No extremity edema.  Abdomen:soft, non-tender, Bowel sounds positive.  Musculoskeletal: no clubbing / cyanosis. No joint deformity upper and lower extremities. Good ROM, no contractures. Normal muscle tone.  Skin: no rashes, lesions, ulcers.  Neurologic: CN 2-12 grossly intact.   Psychiatric: Normal judgment and insight. Alert and oriented x 3. Normal mood. Data Reviewed:  See HPI   Assessment and Plan: * CAP (community acquired pneumonia) -CXR with possible pneumonia to right upper lung.  -CTA chest/Abd/Pelvis without dissection and confirmed right upper lobe infiltrate concerning for infection vs lymphoproliferative process.  -based on symptoms and leukocytosis more closely correlates with infection  -continue  IV vancomycin and Cefepime due to being on immunosuppressive -full respiratory viral panel is pending   Hyperkalemia -administered bicarb, Lokelma and albuterol in ED  -follow repeat potassium in the morning  Thrombocytopenia (HCC) -Plt at 127. She is being followed by hematology but not on treatment for low platelets. 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   Anemia due to stage 3 chronic kidney disease (HCC) -Hgb is stable at 11.2 with baseline around 10-11 -receiving Aranesp from hematology outpatient  CKD (chronic kidney disease), stage III (HCC) -creatinine elevated at 1.82 but has been anywhere  from 1.3-1.8 in the past year.  -has received fluids in ED. Will follow creatinine trend in the morning.  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 Describes more pain to bilateral clavicles more likely MSK and due to her acute infection.       Advance Care Planning: Full  Consults: none  Family Communication: none at bedside  Severity of Illness: The appropriate patient status for this patient is OBSERVATION. Observation status is judged to be reasonable and necessary in order to provide the required intensity of service to ensure the patient's safety. The patient's presenting symptoms, physical exam findings, and initial radiographic and laboratory data in the context of their medical condition is felt to place them at decreased risk for further clinical deterioration. Furthermore, it is anticipated that the patient will be medically stable for discharge from the hospital within 2 midnights of admission.   Author: Orene Desanctis, DO 05/16/2022 9:24 PM  For on call review [email or web address].

## 2022-05-16 NOTE — Assessment & Plan Note (Signed)
-  Plt at 127. She is being followed by hematology but not on treatment for low platelets. 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

## 2022-05-16 NOTE — ED Notes (Signed)
Patient transported to CT 

## 2022-05-16 NOTE — Progress Notes (Signed)
Pharmacy Antibiotic Note  Christine Butler is a 52 y.o. female admitted on 05/16/2022 with pneumonia.  Pharmacy has been consulted for vancomycin and cefepime dosing.  Plan: Cefepime 2g IV q12h Vancomycin 1g IV q48h for estimated AUC 465 using SCr 1.82, Vd 0.5 Check vancomycin levels at steady state, goal AUC 400-550 Follow up renal function & cultures  Height: 5' 1.5" (156.2 cm) Weight: 79.4 kg (175 lb) IBW/kg (Calculated) : 48.95  Temp (24hrs), Avg:98 F (36.7 C), Min:97.7 F (36.5 C), Max:98.6 F (37 C)  Recent Labs  Lab 05/12/22 1445 05/16/22 1150 05/16/22 1215 05/16/22 1357  WBC 5.0 11.2*  --   --   CREATININE  --  1.82*  --   --   LATICACIDVEN  --   --  0.5 0.3*    Estimated Creatinine Clearance: 34.9 mL/min (A) (by C-G formula based on SCr of 1.82 mg/dL (H)).    Allergies  Allergen Reactions   Codeine Nausea And Vomiting    Has taken this medication since liver transplant without excessive nausea and vomiting   Erythromycin Nausea And Vomiting   Fentanyl Nausea And Vomiting    Has taken this medication since liver transplant without excessive nausea and vomiting    Antimicrobials this admission: 3/22 Vanc >> 3/22 Cefepime >>  Dose adjustments this admission:  Microbiology results: 3/22 BCx: 3/22 RVP:  Thank you for allowing pharmacy to be a part of this patient's care.  Peggyann Juba, PharmD, BCPS Pharmacy: [phone] 05/16/2022 9:26 PM

## 2022-05-16 NOTE — Plan of Care (Signed)
Discuss and review plan of care with patient/family  Problem: Education: Goal: Knowledge of General Education information will improve Description: Including pain rating scale, medication(s)/side effects and non-pharmacologic comfort measures Outcome: Progressing

## 2022-05-16 NOTE — Assessment & Plan Note (Signed)
-  continue daily aspirin -no true cardiac symptoms. Describes more pain to bilateral clavicles more likely MSK and due to her acute infection.

## 2022-05-16 NOTE — Assessment & Plan Note (Signed)
-  Hgb is stable at 11.2 with baseline around 10-11 -receiving Aranesp from hematology outpatient

## 2022-05-16 NOTE — Assessment & Plan Note (Signed)
-  CXR with possible pneumonia to right upper lung.  -CTA chest/Abd/Pelvis without dissection and confirmed right upper lobe infiltrate concerning for infection vs lymphoproliferative process.  -based on symptoms and leukocytosis more closely correlates with infection  -continue  IV vancomycin and Cefepime due to being on immunosuppressive -full respiratory viral panel is pending

## 2022-05-16 NOTE — Assessment & Plan Note (Signed)
-  creatinine elevated at 1.82 but has been anywhere from 1.3-1.8 in the past year.  -has received fluids in ED. Will follow creatinine trend in the morning.

## 2022-05-16 NOTE — Assessment & Plan Note (Signed)
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

## 2022-05-17 DIAGNOSIS — N1832 Chronic kidney disease, stage 3b: Secondary | ICD-10-CM | POA: Diagnosis not present

## 2022-05-17 DIAGNOSIS — Z951 Presence of aortocoronary bypass graft: Secondary | ICD-10-CM | POA: Diagnosis not present

## 2022-05-17 DIAGNOSIS — D631 Anemia in chronic kidney disease: Secondary | ICD-10-CM | POA: Diagnosis not present

## 2022-05-17 DIAGNOSIS — E875 Hyperkalemia: Secondary | ICD-10-CM | POA: Diagnosis present

## 2022-05-17 DIAGNOSIS — K219 Gastro-esophageal reflux disease without esophagitis: Secondary | ICD-10-CM | POA: Diagnosis present

## 2022-05-17 DIAGNOSIS — M542 Cervicalgia: Secondary | ICD-10-CM | POA: Diagnosis present

## 2022-05-17 DIAGNOSIS — M79601 Pain in right arm: Secondary | ICD-10-CM | POA: Diagnosis present

## 2022-05-17 DIAGNOSIS — Z8616 Personal history of COVID-19: Secondary | ICD-10-CM | POA: Diagnosis not present

## 2022-05-17 DIAGNOSIS — Z86001 Personal history of in-situ neoplasm of cervix uteri: Secondary | ICD-10-CM | POA: Diagnosis not present

## 2022-05-17 DIAGNOSIS — Z944 Liver transplant status: Secondary | ICD-10-CM | POA: Diagnosis not present

## 2022-05-17 DIAGNOSIS — Z7960 Long term (current) use of unspecified immunomodulators and immunosuppressants: Secondary | ICD-10-CM | POA: Diagnosis not present

## 2022-05-17 DIAGNOSIS — M79602 Pain in left arm: Secondary | ICD-10-CM | POA: Diagnosis present

## 2022-05-17 DIAGNOSIS — R079 Chest pain, unspecified: Secondary | ICD-10-CM | POA: Diagnosis present

## 2022-05-17 DIAGNOSIS — D84821 Immunodeficiency due to drugs: Secondary | ICD-10-CM | POA: Diagnosis not present

## 2022-05-17 DIAGNOSIS — J029 Acute pharyngitis, unspecified: Secondary | ICD-10-CM | POA: Diagnosis present

## 2022-05-17 DIAGNOSIS — E869 Volume depletion, unspecified: Secondary | ICD-10-CM | POA: Diagnosis not present

## 2022-05-17 DIAGNOSIS — Z7982 Long term (current) use of aspirin: Secondary | ICD-10-CM | POA: Diagnosis not present

## 2022-05-17 DIAGNOSIS — Z79899 Other long term (current) drug therapy: Secondary | ICD-10-CM | POA: Diagnosis not present

## 2022-05-17 DIAGNOSIS — I2510 Atherosclerotic heart disease of native coronary artery without angina pectoris: Secondary | ICD-10-CM | POA: Diagnosis present

## 2022-05-17 DIAGNOSIS — E7849 Other hyperlipidemia: Secondary | ICD-10-CM | POA: Diagnosis present

## 2022-05-17 DIAGNOSIS — I130 Hypertensive heart and chronic kidney disease with heart failure and stage 1 through stage 4 chronic kidney disease, or unspecified chronic kidney disease: Secondary | ICD-10-CM | POA: Diagnosis not present

## 2022-05-17 DIAGNOSIS — D696 Thrombocytopenia, unspecified: Secondary | ICD-10-CM | POA: Diagnosis not present

## 2022-05-17 DIAGNOSIS — M818 Other osteoporosis without current pathological fracture: Secondary | ICD-10-CM | POA: Diagnosis present

## 2022-05-17 DIAGNOSIS — I5032 Chronic diastolic (congestive) heart failure: Secondary | ICD-10-CM | POA: Diagnosis not present

## 2022-05-17 DIAGNOSIS — J189 Pneumonia, unspecified organism: Secondary | ICD-10-CM | POA: Diagnosis not present

## 2022-05-17 LAB — RESPIRATORY PANEL BY PCR

## 2022-05-17 LAB — BASIC METABOLIC PANEL
Anion gap: 8 (ref 5–15)
BUN: 26 mg/dL — ABNORMAL HIGH (ref 6–20)
CO2: 22 mmol/L (ref 22–32)
Calcium: 7.9 mg/dL — ABNORMAL LOW (ref 8.9–10.3)
Chloride: 109 mmol/L (ref 98–111)
Creatinine, Ser: 1.56 mg/dL — ABNORMAL HIGH (ref 0.44–1.00)
GFR, Estimated: 40 mL/min — ABNORMAL LOW (ref >60)
Glucose, Bld: 90 mg/dL (ref 70–99)
Potassium: 5 mmol/L (ref 3.5–5.1)
Sodium: 139 mmol/L (ref 135–145)

## 2022-05-17 LAB — CBC
HCT: 30.9 % — ABNORMAL LOW (ref 36.0–46.0)
Hemoglobin: 10.4 g/dL — ABNORMAL LOW (ref 12.0–15.0)
MCH: 31.9 pg (ref 26.0–34.0)
MCHC: 33.7 g/dL (ref 30.0–36.0)
MCV: 94.8 fL (ref 80.0–100.0)
Platelets: 136 10*3/uL — ABNORMAL LOW (ref 150–400)
RBC: 3.26 MIL/uL — ABNORMAL LOW (ref 3.87–5.11)
RDW: 12.8 % (ref 11.5–15.5)
WBC: 7.9 10*3/uL (ref 4.0–10.5)
nRBC: 0 % (ref 0.0–0.2)

## 2022-05-17 LAB — HIV ANTIBODY (ROUTINE TESTING W REFLEX): HIV Screen 4th Generation wRfx: NONREACTIVE

## 2022-05-17 LAB — MRSA NEXT GEN BY PCR, NASAL: MRSA by PCR Next Gen: NOT DETECTED

## 2022-05-17 MED ORDER — SODIUM CHLORIDE 0.9 % IV SOLN
500.0000 mg | INTRAVENOUS | Status: DC
  Administered 2022-05-17: 500 mg via INTRAVENOUS
  Filled 2022-05-17 (×2): qty 5

## 2022-05-17 MED ORDER — ACETAMINOPHEN 500 MG PO TABS
1000.0000 mg | ORAL_TABLET | Freq: Four times a day (QID) | ORAL | Status: DC | PRN
  Administered 2022-05-17: 1000 mg via ORAL
  Filled 2022-05-17: qty 2

## 2022-05-17 NOTE — Progress Notes (Signed)
  Transition of Care [facility]) Screening Note   Patient Details  Name: LAURENCE PIRKEY Date of Birth: [birth year]   Transition of Care [facility]) CM/SW Contact:    Henrietta Dine, RN Phone Number: 05/17/2022, 11:27 AM    Transition of Care Department [facility]) has reviewed patient and no TOC needs have been identified at this time. We will continue to monitor patient advancement through interdisciplinary progression rounds. If new patient transition needs arise, please place a TOC consult.

## 2022-05-17 NOTE — Progress Notes (Signed)
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  On presentation ,she was afebrile but hypertensive.  Lab work showed leukocytosis of 11.2, potassium 5.9, creatinine of 1.8.  Chest x-ray showed possible right upper lobe pneumonia.  CT imaging confirmed right upper lobe infiltrate.  Started on broad spectrum antibiotics.  Assessment & Plan:  Principal Problem:   CAP (community acquired pneumonia) Active Problems:   S/P CABG x 2   S/P liver transplant ([place])   (HFpEF) heart failure with preserved ejection fraction (HCC)   CKD (chronic kidney disease), stage III (HCC)   Anemia due to stage 3 chronic kidney disease (HCC)   Thrombocytopenia (HCC)   Hyperkalemia   Community-acquired pneumonia: Presented with chest pain, shortness of breath, chills, cough.  Chest imaging showed stable right upper lobe infiltrate.  She was started on vancomycin and cefepime due to history of liver transplant and she is on immunosuppressants. Currently on room air.  Respiratory viral panel negative, blood culture sent.  Hyperkalemia: Given bicarb, Lokelma, albuterol.  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  Follows with [facility].  On tacrolimus, ursodiol  Thrombocytopenia: Chronic.  Follows with hematology.  Stable  Normocytic anemia: Associated with chronic kidney disease stage IIIb.  Currently hemoglobin stable.  Receives Aranesp  CKD stage IIIb: Baseline creatinine  around 1.3-1.8.  Currently kidney function at baseline.  Chronic diastolic CHF: Appears volume depleted on presentation.  Currently euvolemic.  On Coreg  Coronary artery disease: Status post CABG x 2.  On aspirin.  No anginal symptoms.       DVT prophylaxis:enoxaparin (LOVENOX) injection 40 mg Start: 05/16/22 2200     Code Status: Full Code  Family Communication: None at bedside  Patient status:Obs  Patient is from :Home  Anticipated discharge AN:[id number]  Estimated DC date:tomorrow   Consultants: None  Procedures:None  Antimicrobials:  Anti-infectives (From admission, onward)    Start     Dose/Rate Route Frequency Ordered Stop   05/18/22 1600  vancomycin (VANCOCIN) IVPB 1000 mg/200 mL premix        1,000 mg 200 mL/hr over 60 Minutes Intravenous Every 48 hours 05/16/22 2132     05/17/22 0300  ceFEPIme (MAXIPIME) 2 g in sodium chloride 0.9 % 100 mL IVPB        2 g 200 mL/hr over 30 Minutes Intravenous Every 12 hours 05/16/22 2131     05/16/22 1515  vancomycin (VANCOCIN) IVPB 1000 mg/200 mL premix        1,000 mg 200 mL/hr over 60 Minutes Intravenous  Once 05/16/22 1509 05/16/22 1756   05/16/22 1515  ceFEPIme (MAXIPIME) 2 g in sodium chloride 0.9 % 100 mL IVPB        2 g 200 mL/hr over 30 Minutes Intravenous  Once 05/16/22 1509 05/16/22 1611       Subjective: Patient seen and examined at bedside today.  Hemodynamically stable comfortable.  On room air.  Denies any worsening shortness of breath or cough.  We discussed about monitoring 1 more night with IV antibiotics and possible discharge home tomorrow.  Objective: Vitals:   05/16/22 1952 05/16/22 2348 05/17/22 0432 05/17/22 0805  BP:  129/77 119/68 129/76  Pulse:  78 73 71  Resp:  18 18   Temp:  98.9 F (37.2 C) 98.7 F (37.1 C) 98.4 F (36.9 C)  TempSrc:  Oral Oral Oral  SpO2:  94% 96% 98%  Weight: 79.4 kg     Height: 5' 1.5" (1.562 m)       Intake/Output Summary (Last 24 hours) at 05/17/2022 0836 Last  data filed at 05/17/2022 0600 Gross per 24 hour  Intake 1137.31 ml  Output --  Net 1137.31 ml   Filed Weights   05/16/22 1952  Weight: 79.4 kg    Examination:  General exam: Overall comfortable, not in distress HEENT: PERRL Respiratory system:  no wheezes or crackles  Cardiovascular system: S1 & S2 heard, RRR.  Gastrointestinal system: Abdomen is nondistended, soft and nontender. Central nervous system: Alert and oriented Extremities: No edema, no clubbing ,no cyanosis Skin: No rashes, no ulcers,no icterus     Data Reviewed: I have personally reviewed following labs and imaging studies  CBC: Recent Labs  Lab 05/12/22 1445 05/16/22 1150 05/17/22 0255  WBC 5.0 11.2* 7.9  NEUTROABS 2.7  --   --   HGB 11.5* 11.5* 10.4*  HCT 32.2* 32.0* 30.9*  MCV 93.1 91.7 94.8  PLT 143* 127* [id number]*   Basic Metabolic Panel: Recent Labs  Lab 05/16/22 1150 05/16/22 1246 05/16/22 1755 05/17/22 0255  NA 135  --   --  139  K 5.9* 6.1* 4.7 5.0  CL 107  --   --  109  CO2 20*  --   --  22  GLUCOSE 105*  --   --  90  BUN 35*  --   --  26*  CREATININE 1.82*  --   --  1.56*  CALCIUM 9.2  --   --  7.9*     Recent Results (from the past 240 hour(s))  Respiratory (~20 pathogens) panel by PCR     Status: None   Collection Time: 05/16/22  5:55 PM   Specimen: Nasopharyngeal Swab; Respiratory  Result Value Ref Range Status   Adenovirus NOT DETECTED NOT DETECTED Final   Coronavirus 229E NOT DETECTED NOT DETECTED Final    Comment: (NOTE) The Coronavirus on the Respiratory Panel, DOES NOT test for the novel  Coronavirus (2019 nCoV)    Coronavirus HKU1 NOT DETECTED NOT DETECTED Final   Coronavirus NL63 NOT DETECTED NOT DETECTED Final   Coronavirus OC43 NOT DETECTED NOT DETECTED Final   Metapneumovirus NOT DETECTED NOT DETECTED Final   Rhinovirus / Enterovirus NOT DETECTED NOT DETECTED Final   Influenza A NOT DETECTED NOT DETECTED Final   Influenza B NOT DETECTED NOT DETECTED Final    Parainfluenza Virus 1 NOT DETECTED NOT DETECTED Final   Parainfluenza Virus 2 NOT DETECTED NOT DETECTED Final   Parainfluenza Virus 3 NOT DETECTED NOT DETECTED Final   Parainfluenza Virus 4 NOT DETECTED NOT DETECTED Final   Respiratory Syncytial Virus NOT DETECTED NOT DETECTED Final   Bordetella pertussis NOT DETECTED NOT DETECTED Final   Bordetella Parapertussis NOT DETECTED NOT DETECTED Final   Chlamydophila pneumoniae NOT DETECTED NOT DETECTED Final   Mycoplasma pneumoniae NOT DETECTED NOT DETECTED Final    Comment: Performed at [facility], [address], [place], [place] [address]  MRSA Next Gen by PCR, Nasal     Status: None   Collection Time: 05/16/22  9:33 PM  Specimen: Nasal Mucosa; Nasal Swab  Result Value Ref Range Status   MRSA by PCR Next Gen NOT DETECTED NOT DETECTED Final    Comment: (NOTE) The GeneXpert MRSA Assay (FDA approved for NASAL specimens only), is one component of a comprehensive MRSA colonization surveillance program. It is not intended to diagnose MRSA infection nor to guide or monitor treatment for MRSA infections. Test performance is not FDA approved in patients less than 59 years old. Performed at [facility], [place] [address] [place], [place] [address]      Radiology Studies: CT Soft Tissue Neck Wo Contrast  Result Date: 05/16/2022 CLINICAL DATA:  Hoarseness, normal laryngeal exam. EXAM: CT NECK WITHOUT CONTRAST TECHNIQUE: Multidetector CT imaging of the neck was performed following the standard protocol without intravenous contrast. RADIATION DOSE REDUCTION: This exam was performed according to the departmental dose-optimization program which includes automated exposure control, adjustment of the mA and/or kV according to patient size and/or use of iterative reconstruction technique. COMPARISON:  None Available. FINDINGS: Pharynx and larynx: Normal. No mass or swelling. Salivary glands: No inflammation, mass, or stone. Thyroid:  Normal. Lymph nodes: No suspicious cervical lymphadenopathy. Vascular: Atherosclerotic calcifications of the carotid bulbs. Limited intracranial: Unremarkable. Visualized orbits: Normal. Mastoids and visualized paranasal sinuses: Well aerated. Skeleton: No suspicious bone lesions. Upper chest: Please refer to same-day CTA chest report. Other: None. IMPRESSION: No neck mass, fluid collection or suspicious cervical lymphadenopathy. Electronically Signed   By: Emmit Alexanders M.D.   On: 05/16/2022 15:07   CT Angio Chest/Abd/Pel for Dissection W and/or Wo Contrast  Result Date: 05/16/2022 CLINICAL DATA:  Chest pain EXAM: CT ANGIOGRAPHY CHEST, ABDOMEN AND PELVIS TECHNIQUE: Non-contrast CT of the chest was initially obtained. Multidetector CT imaging through the chest, abdomen and pelvis was performed using the standard protocol during bolus administration of intravenous contrast. Multiplanar reconstructed images and MIPs were obtained and reviewed to evaluate the vascular anatomy. RADIATION DOSE REDUCTION: This exam was performed according to the departmental dose-optimization program which includes automated exposure control, adjustment of the mA and/or kV according to patient size and/or use of iterative reconstruction technique. CONTRAST:  17mL OMNIPAQUE IOHEXOL 350 MG/ML SOLN COMPARISON:  X-ray chest 05/16/2022 earlier. CT abdomen pelvis 04/01/2021. FINDINGS: CTA CHEST FINDINGS Cardiovascular: Patient is status post median sternotomy. The heart is nonenlarged. No pericardial effusion. Right upper chest port which is accessed. The ascending aorta at the level of the right pulmonary artery has a diameter of 3.0 x 3.1 cm. The descending thoracic aorta measures 2.2 x 2.1 cm. Aortic arch has a diameter of 2.5 cm. The ascending aorta just distal to the aortic root has a diameter of 2.4 cm. Slight atherosclerotic changes. The origin of the great vessels are preserved. No dissection or aneurysm formation. On the  noncontrast dataset no intramural hematoma clearly seen along the thoracic aorta. Mediastinum/Nodes: No specific abnormal lymph node enlargement seen in the axillary region, hilum or mediastinum. Small hiatal hernia. Normal caliber thoracic esophagus. Preserved thyroid gland. Lungs/Pleura: Left lung is clear. No consolidation, pneumothorax. There is breathing motion. Right lung is also without pleural effusion or pneumothorax. There is a infiltrative area in the right upper lobe with some air bronchograms. Acute infiltrate is possible such as pneumonia. With the history of a liver transplant in the patient being potential immunosuppressed, neoplasm is also in the differential. Recommend follow-up. This was seen on chest x-ray. Musculoskeletal: Slight curvature of the spine. Chronic right-sided rib deformities are noted. Review of the MIP images confirms  the above findings. CTA ABDOMEN AND PELVIS FINDINGS VASCULAR Aorta: Slight atherosclerotic calcified plaque. No dissection or aneurysm formation. Celiac: Standard branching pattern. There are some surgical changes along the common hepatic artery at the liver hilum consistent with anastomosis for transplant. No definite stricture. SMA: Patent without evidence of aneurysm, dissection, vasculitis or significant stenosis. Renals: Single left and 2 right renal arteries are identified IMA: Patent without evidence of aneurysm, dissection, vasculitis or significant stenosis. Inflow: Slight atherosclerotic changes. No dissection or aneurysm formation. Veins: No obvious venous abnormality within the limitations of this arterial phase study. Review of the MIP images confirms the above findings. NON-VASCULAR Hepatobiliary: With the limits of the early phase of the arterial bolus, liver is grossly preserved. Again patient has a history of liver transplant. No clear hypervascular liver lesion. Previous biliary stents are no longer identified. Gallbladder is not seen. Pancreas:  Unremarkable. No pancreatic ductal dilatation or surrounding inflammatory changes. Spleen: Preserved splenic enhancement.  No mass lesion. Adrenals/Urinary Tract: The adrenal glands are preserved. Enhancing renal mass or collecting system dilatation. The ureters have normal course and caliber extending down to the bladder. Preserved contours of the urinary bladder. Bladder is distended. Stomach/Bowel: Moderate colonic stool. Normal appendix. Large and small bowel are nondilated. The stomach is nondilated. Lymphatic: No specific abnormal lymph node enlargement seen in the abdomen and pelvis. Reproductive: Status post hysterectomy. No adnexal masses. Other: Small fat containing umbilical hernia. Slight areas of subcutaneous fat stranding, nonspecific. Small epigastric midline anterior abdominal wall fat containing hernia as well. Musculoskeletal: Scattered degenerative changes of the spine and pelvis. Schmorl's node deformity seen along the superior endplate of L2 and L5. Appearance is unchanged from prior Review of the MIP images confirms the above findings. IMPRESSION: Scattered mild atherosclerotic calcified plaque and noncalcified plaque. No dissection or aneurysm formation. Postop chest.  Right upper chest port. Surgical changes from liver transplant. As seen on the x-ray there is a right upper lobe area of parenchymal lung opacity. With patient's history possibilities would include infection. If the patient is immunosuppressed this also could be lymphoproliferative process and recommend correlation with specific symptoms and follow-up. Electronically Signed   By: Jill Side M.D.   On: 05/16/2022 15:00   DG Chest 2 View  Result Date: 05/16/2022 CLINICAL DATA:  Chest pain. EXAM: CHEST - 2 VIEW COMPARISON:  Chest radiographs 09/04/2021 and 09/19/2019 FINDINGS: Status post median sternotomy and CABG. Right chest wall porta catheter tip again overlies the superior aspect of the right atrium, unchanged. Thereis  subtle pulmonary artery/interstitial thickening seen within the mid to upper right lung that is new from 08/27/2021. The left lung is clear. No pleural effusion pneumothorax. Mild multilevel degenerative disc changes of the thoracic spine. IMPRESSION: Subtle pulmonary artery/interstitial thickening within the mid to upper right lung that is new from 08/27/2021. This may represent early pneumonia. Recommend clinical correlation. Asymmetric minimal interstitial pulmonary edema is felt less likely. Electronically Signed   By: Yvonne Kendall M.D.   On: 05/16/2022 12:10    Scheduled Meds:  albuterol  10 mg Nebulization Once   aspirin EC  81 mg Oral Daily   carvedilol  12.5 mg Oral BID WC   Chlorhexidine Gluconate Cloth  6 each Topical Daily   enoxaparin (LOVENOX) injection  40 mg Subcutaneous Q24H   pantoprazole  40 mg Oral BID   rosuvastatin  5 mg Oral QHS   sodium chloride flush  10-40 mL Intracatheter Q12H   sucralfate  1 g Oral BID  tacrolimus ER  4 mg Oral QAC breakfast   ursodiol  300 mg Oral Daily   ursodiol  600 mg Oral QHS   Continuous Infusions:  ceFEPime (MAXIPIME) IV Stopped (05/17/22 0405)   [START ON 05/18/2022] vancomycin       LOS: 0 days   Shelly Coss, MD [facility] P3/23/2024, 8:36 AM

## 2022-05-18 DIAGNOSIS — J189 Pneumonia, unspecified organism: Secondary | ICD-10-CM | POA: Diagnosis not present

## 2022-05-18 LAB — CBC
HCT: 29.2 % — ABNORMAL LOW (ref 36.0–46.0)
Hemoglobin: 9.8 g/dL — ABNORMAL LOW (ref 12.0–15.0)
MCH: 32 pg (ref 26.0–34.0)
MCHC: 33.6 g/dL (ref 30.0–36.0)
MCV: 95.4 fL (ref 80.0–100.0)
Platelets: 120 10*3/uL — ABNORMAL LOW (ref 150–400)
RBC: 3.06 MIL/uL — ABNORMAL LOW (ref 3.87–5.11)
RDW: 12.7 % (ref 11.5–15.5)
WBC: 5.3 10*3/uL (ref 4.0–10.5)
nRBC: 0 % (ref 0.0–0.2)

## 2022-05-18 LAB — BASIC METABOLIC PANEL
Anion gap: 6 (ref 5–15)
BUN: 28 mg/dL — ABNORMAL HIGH (ref 6–20)
CO2: 21 mmol/L — ABNORMAL LOW (ref 22–32)
Calcium: 7.6 mg/dL — ABNORMAL LOW (ref 8.9–10.3)
Chloride: 110 mmol/L (ref 98–111)
Creatinine, Ser: 1.58 mg/dL — ABNORMAL HIGH (ref 0.44–1.00)
GFR, Estimated: 39 mL/min — ABNORMAL LOW (ref >60)
Glucose, Bld: 92 mg/dL (ref 70–99)
Potassium: 4.9 mmol/L (ref 3.5–5.1)
Sodium: 137 mmol/L (ref 135–145)

## 2022-05-18 MED ORDER — CEFDINIR 300 MG PO CAPS
300.0000 mg | ORAL_CAPSULE | Freq: Two times a day (BID) | ORAL | 0 refills | Status: AC
  Filled 2022-05-18: qty 8, 4d supply, fill #0

## 2022-05-18 MED ORDER — HEPARIN SOD (PORK) LOCK FLUSH 100 UNIT/ML IV SOLN
500.0000 [IU] | INTRAVENOUS | Status: AC | PRN
  Administered 2022-05-18: 500 [IU]

## 2022-05-18 MED ORDER — AMOXICILLIN-POT CLAVULANATE 875-125 MG PO TABS: 1.0000 | ORAL_TABLET | Freq: Two times a day (BID) | ORAL | Status: DC

## 2022-05-18 MED ORDER — CEFDINIR 300 MG PO CAPS
300.0000 mg | ORAL_CAPSULE | Freq: Two times a day (BID) | ORAL | Status: DC
  Administered 2022-05-18: 300 mg via ORAL
  Filled 2022-05-18: qty 1

## 2022-05-18 MED ORDER — AZITHROMYCIN 250 MG PO TABS
500.0000 mg | ORAL_TABLET | Freq: Every day | ORAL | Status: DC
  Administered 2022-05-18: 500 mg via ORAL
  Filled 2022-05-18: qty 2

## 2022-05-18 MED ORDER — AZITHROMYCIN 500 MG PO TABS
500.0000 mg | ORAL_TABLET | Freq: Every day | ORAL | 0 refills | Status: AC
  Filled 2022-05-18: qty 3, 3d supply, fill #0

## 2022-05-18 NOTE — Discharge Summary (Signed)
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  On presentation ,she was afebrile but hypertensive.  Lab work showed leukocytosis of 11.2, potassium 5.9, creatinine of 1.8.  Chest x-ray showed possible right upper lobe pneumonia.  CT imaging confirmed right upper lobe infiltrate.  Started on broad spectrum antibiotics.  Hospital course remained stable.  She remained on room air.  Clinically significantly improved immediately after admission.  This morning seems comfortable, no complaint of shortness of breath or cough.  Medically stable for discharge to home today with oral antibiotics.  Following problems were addressed during the hospitalization:  Community-acquired pneumonia: Presented with chest pain, shortness of breath, chills, cough.  Chest imaging showed stable right upper lobe infiltrate.  She was started on vancomycin and cefepime due to history of liver transplant and she is on immunosuppressants. Currently on room air.  Respiratory viral panel negative, blood culture sent,NGTD. Significantly improved clinically.  Antibiotics changed to oral.   Hyperkalemia: Given bicarb, Lokelma, albuterol.  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  Follows with [facility].   On tacrolimus, ursodiol   Thrombocytopenia: Chronic.  Follows with hematology.  Stable   Normocytic anemia: Associated with chronic kidney disease stage IIIb.  Currently hemoglobin stable.  Receives Aranesp   CKD stage IIIb: Baseline creatinine around 1.3-1.8.  Currently kidney function at baseline.   Chronic diastolic CHF: Appears volume depleted on presentation.  Currently euvolemic.  On Coreg   Coronary artery disease: Status post CABG x 2.  On aspirin.  No anginal symptoms.     Discharge Diagnoses:  Principal Problem:   CAP (community acquired pneumonia) Active Problems:   S/P CABG x 2   S/P liver transplant ([place])   (HFpEF) heart failure with preserved ejection fraction (HCC)   CKD (chronic kidney disease), stage III (HCC)   Anemia due to stage 3 chronic kidney disease (HCC)   Thrombocytopenia (HCC)   Hyperkalemia    Discharge Instructions  Discharge Instructions     Diet - low sodium heart healthy   Complete by: As directed    Discharge instructions   Complete by: As directed    1)Please take prescribed medications as instructed 2)Follow up with your PCP in a week.   Increase activity slowly   Complete by: As directed       Allergies as of 05/18/2022       Reactions   Codeine Nausea And Vomiting, Other (See Comments)   Has taken this medication since liver transplant without excessive nausea and vomiting   Erythromycin Nausea And Vomiting   Fentanyl Nausea And Vomiting   Has taken this medication since liver transplant without excessive nausea and vomiting        Medication List     STOP taking these medications    amoxicillin-clavulanate 875-125 MG tablet Commonly known as: AUGMENTIN       TAKE these medications    acetaminophen 500 MG tablet  Commonly known as: TYLENOL Take 1,000 mg by mouth every 8 (eight) hours as needed for mild pain.   Aspirin Low Dose 81 MG tablet Generic drug: aspirin EC Take 81 mg by mouth daily.   azithromycin  500 MG tablet Commonly known as: ZITHROMAX Take 1 tablet (500 mg total) by mouth daily for 3 days. Start taking on: May 19, 2022   Biotin 5 MG Caps Take 5 mg by mouth daily in the afternoon.   carvedilol 25 MG tablet Commonly known as: COREG Take 1/2 tablet (12.5 mg) by mouth 2 times daily with a meal.   cefdinir 300 MG capsule Commonly known as: OMNICEF Take 1 capsule (300 mg total) by mouth every 12 (twelve) hours for 4 days. Start taking on: May 19, 2022   CITRACAL PO Take 1,200 mg by mouth daily.   Envarsus XR 4 MG Tb24 Generic drug: tacrolimus ER Take 1 tablet (4 mg total) by mouth in the morning.   estradiol 0.05 MG/24HR patch Commonly known as: VIVELLE-DOT Apply 1 patch twice a week   finasteride 5 MG tablet Commonly known as: PROSCAR Take 1 tablet (5 mg total) by mouth daily. What changed: when to take this   fluticasone 50 MCG/ACT nasal spray Commonly known as: FLONASE Place 1 spray into both nostrils daily as needed for allergies or rhinitis.   loperamide 2 MG tablet Commonly known as: IMODIUM A-D Take 4 mg by mouth every other day.   metroNIDAZOLE 0.75 % cream Commonly known as: METROCREAM Apply 1 Application topically to affected area 2 (two) times daily.   nitroGLYCERIN 0.4 MG SL tablet Commonly known as: NITROSTAT Place 0.4 mg under the tongue every 5 (five) minutes as needed for chest pain.   pantoprazole 40 MG tablet Commonly known as: PROTONIX Take 1 tablet (40 mg total) by mouth 2 (two) times daily. What changed: when to take this   polyethylene glycol-electrolytes 420 g solution Commonly known as: NuLYTELY Use as directed   rosuvastatin 5 MG tablet Commonly known as: CRESTOR Take 1 tablet (5 mg total) by mouth daily. What changed: when to take this   sucralfate 1 g tablet Commonly known as: CARAFATE Take 1 tablet (1 g total) by mouth 4 (four) times daily on an empty stomach for 14 days What changed:  when to take this reasons  to take this   ursodiol 300 MG capsule Commonly known as: ACTIGALL Take 1 capsule by mouth 3 times a day. What changed:  how much to take when to take this additional instructions        Follow-up Information     Scifres, Earlie Server, [place]. Schedule an appointment as soon as possible for a visit in 1 week(s).   Specialty: Physician Assistant Contact information: [address] [place] [address] [phone]                Allergies  Allergen Reactions   Codeine Nausea And Vomiting and Other (See Comments)    Has taken this medication since liver transplant without excessive nausea and vomiting   Erythromycin Nausea And Vomiting   Fentanyl Nausea And Vomiting    Has taken this medication since liver transplant without excessive nausea and vomiting    Consultations: None   Procedures/Studies: CT Soft Tissue Neck Wo Contrast  Result Date: 05/16/2022 CLINICAL DATA:  Hoarseness, normal laryngeal exam. EXAM: CT NECK WITHOUT CONTRAST TECHNIQUE: Multidetector CT imaging of the neck was performed following the standard protocol without intravenous contrast.  RADIATION DOSE REDUCTION: This exam was performed according to the departmental dose-optimization program which includes automated exposure control, adjustment of the mA and/or kV according to patient size and/or use of iterative reconstruction technique. COMPARISON:  None Available. FINDINGS: Pharynx and larynx: Normal. No mass or swelling. Salivary glands: No inflammation, mass, or stone. Thyroid: Normal. Lymph nodes: No suspicious cervical lymphadenopathy. Vascular: Atherosclerotic calcifications of the carotid bulbs. Limited intracranial: Unremarkable. Visualized orbits: Normal. Mastoids and visualized paranasal sinuses: Well aerated. Skeleton: No suspicious bone lesions. Upper chest: Please refer to same-day CTA chest report. Other: None. IMPRESSION: No neck mass, fluid collection or suspicious cervical  lymphadenopathy. Electronically Signed   By: Emmit Alexanders M.D.   On: 05/16/2022 15:07   CT Angio Chest/Abd/Pel for Dissection W and/or Wo Contrast  Result Date: 05/16/2022 CLINICAL DATA:  Chest pain EXAM: CT ANGIOGRAPHY CHEST, ABDOMEN AND PELVIS TECHNIQUE: Non-contrast CT of the chest was initially obtained. Multidetector CT imaging through the chest, abdomen and pelvis was performed using the standard protocol during bolus administration of intravenous contrast. Multiplanar reconstructed images and MIPs were obtained and reviewed to evaluate the vascular anatomy. RADIATION DOSE REDUCTION: This exam was performed according to the departmental dose-optimization program which includes automated exposure control, adjustment of the mA and/or kV according to patient size and/or use of iterative reconstruction technique. CONTRAST:  39mL OMNIPAQUE IOHEXOL 350 MG/ML SOLN COMPARISON:  X-ray chest 05/16/2022 earlier. CT abdomen pelvis 04/01/2021. FINDINGS: CTA CHEST FINDINGS Cardiovascular: Patient is status post median sternotomy. The heart is nonenlarged. No pericardial effusion. Right upper chest port which is accessed. The ascending aorta at the level of the right pulmonary artery has a diameter of 3.0 x 3.1 cm. The descending thoracic aorta measures 2.2 x 2.1 cm. Aortic arch has a diameter of 2.5 cm. The ascending aorta just distal to the aortic root has a diameter of 2.4 cm. Slight atherosclerotic changes. The origin of the great vessels are preserved. No dissection or aneurysm formation. On the noncontrast dataset no intramural hematoma clearly seen along the thoracic aorta. Mediastinum/Nodes: No specific abnormal lymph node enlargement seen in the axillary region, hilum or mediastinum. Small hiatal hernia. Normal caliber thoracic esophagus. Preserved thyroid gland. Lungs/Pleura: Left lung is clear. No consolidation, pneumothorax. There is breathing motion. Right lung is also without pleural effusion or  pneumothorax. There is a infiltrative area in the right upper lobe with some air bronchograms. Acute infiltrate is possible such as pneumonia. With the history of a liver transplant in the patient being potential immunosuppressed, neoplasm is also in the differential. Recommend follow-up. This was seen on chest x-ray. Musculoskeletal: Slight curvature of the spine. Chronic right-sided rib deformities are noted. Review of the MIP images confirms the above findings. CTA ABDOMEN AND PELVIS FINDINGS VASCULAR Aorta: Slight atherosclerotic calcified plaque. No dissection or aneurysm formation. Celiac: Standard branching pattern. There are some surgical changes along the common hepatic artery at the liver hilum consistent with anastomosis for transplant. No definite stricture. SMA: Patent without evidence of aneurysm, dissection, vasculitis or significant stenosis. Renals: Single left and 2 right renal arteries are identified IMA: Patent without evidence of aneurysm, dissection, vasculitis or significant stenosis. Inflow: Slight atherosclerotic changes. No dissection or aneurysm formation. Veins: No obvious venous abnormality within the limitations of this arterial phase study. Review of the MIP images confirms the above findings. NON-VASCULAR Hepatobiliary: With the limits of the early phase of the arterial bolus, liver is grossly preserved. Again patient has a history of liver transplant. No clear hypervascular  liver lesion. Previous biliary stents are no longer identified. Gallbladder is not seen. Pancreas: Unremarkable. No pancreatic ductal dilatation or surrounding inflammatory changes. Spleen: Preserved splenic enhancement.  No mass lesion. Adrenals/Urinary Tract: The adrenal glands are preserved. Enhancing renal mass or collecting system dilatation. The ureters have normal course and caliber extending down to the bladder. Preserved contours of the urinary bladder. Bladder is distended. Stomach/Bowel: Moderate  colonic stool. Normal appendix. Large and small bowel are nondilated. The stomach is nondilated. Lymphatic: No specific abnormal lymph node enlargement seen in the abdomen and pelvis. Reproductive: Status post hysterectomy. No adnexal masses. Other: Small fat containing umbilical hernia. Slight areas of subcutaneous fat stranding, nonspecific. Small epigastric midline anterior abdominal wall fat containing hernia as well. Musculoskeletal: Scattered degenerative changes of the spine and pelvis. Schmorl's node deformity seen along the superior endplate of L2 and L5. Appearance is unchanged from prior Review of the MIP images confirms the above findings. IMPRESSION: Scattered mild atherosclerotic calcified plaque and noncalcified plaque. No dissection or aneurysm formation. Postop chest.  Right upper chest port. Surgical changes from liver transplant. As seen on the x-ray there is a right upper lobe area of parenchymal lung opacity. With patient's history possibilities would include infection. If the patient is immunosuppressed this also could be lymphoproliferative process and recommend correlation with specific symptoms and follow-up. Electronically Signed   By: Jill Side M.D.   On: 05/16/2022 15:00   DG Chest 2 View  Result Date: 05/16/2022 CLINICAL DATA:  Chest pain. EXAM: CHEST - 2 VIEW COMPARISON:  Chest radiographs 09/04/2021 and 09/19/2019 FINDINGS: Status post median sternotomy and CABG. Right chest wall porta catheter tip again overlies the superior aspect of the right atrium, unchanged. Thereis subtle pulmonary artery/interstitial thickening seen within the mid to upper right lung that is new from 08/27/2021. The left lung is clear. No pleural effusion pneumothorax. Mild multilevel degenerative disc changes of the thoracic spine. IMPRESSION: Subtle pulmonary artery/interstitial thickening within the mid to upper right lung that is new from 08/27/2021. This may represent early pneumonia. Recommend  clinical correlation. Asymmetric minimal interstitial pulmonary edema is felt less likely. Electronically Signed   By: Yvonne Kendall M.D.   On: 05/16/2022 12:10      Subjective: Patient seen and examined at bedside today.  Hemodynamically stable for discharge  Discharge Exam: Vitals:   05/17/22 1945 05/18/22 0618  BP: (!) 142/78 (!) 138/100  Pulse: 70 68  Resp: 18 18  Temp: 98.3 F (36.8 C) 97.8 F (36.6 C)  SpO2: 98% 98%   Vitals:   05/17/22 0805 05/17/22 1323 05/17/22 1945 05/18/22 0618  BP: 129/76 130/76 (!) 142/78 (!) 138/100  Pulse: 71 68 70 68  Resp:  16 18 18   Temp: 98.4 F (36.9 C) 98.6 F (37 C) 98.3 F (36.8 C) 97.8 F (36.6 C)  TempSrc: Oral Oral Oral Oral  SpO2: 98% 98% 98% 98%  Weight:      Height:        General: Pt is alert, awake, not in acute distress Cardiovascular: RRR, S1/S2 +, no rubs, no gallops Respiratory: CTA bilaterally, no wheezing, no rhonchi Abdominal: Soft, NT, ND, bowel sounds + Extremities: no edema, no cyanosis    The results of significant diagnostics from this hospitalization (including imaging, microbiology, ancillary and laboratory) are listed below for reference.     Microbiology: Recent Results (from the past 240 hour(s))  Blood culture (routine x 2)     Status: None (Preliminary result)   Collection  Time: 05/16/22 12:15 PM   Specimen: BLOOD  Result Value Ref Range Status   Specimen Description   Final    BLOOD RIGHT ANTECUBITAL Performed at [facility], [place] [address], [place], [place] [address]    Special Requests   Final    BOTTLES DRAWN AEROBIC AND ANAEROBIC Blood Culture adequate volume Performed at [facility], [address] [place], [place] [address]    Culture   Final    NO GROWTH 2 DAYS Performed at [facility], [place] [address], [place], [place] [address]    Report Status PENDING  Incomplete  Blood culture (routine x 2)     Status: None (Preliminary result)   Collection  Time: 05/16/22 12:20 PM   Specimen: BLOOD RIGHT HAND  Result Value Ref Range Status   Specimen Description   Final    BLOOD RIGHT HAND Performed at [facility], [address] [place], [place] [address]    Special Requests   Final    BOTTLES DRAWN AEROBIC AND ANAEROBIC Blood Culture adequate volume Performed at [facility], [address] [place], [place] [address]    Culture   Final    NO GROWTH 2 DAYS Performed at [facility], [place] [address] [place], [place] [address]    Report Status PENDING  Incomplete  Respiratory (~20 pathogens) panel by PCR     Status: None   Collection Time: 05/16/22  5:55 PM   Specimen: Nasopharyngeal Swab; Respiratory  Result Value Ref Range Status   Adenovirus NOT DETECTED NOT DETECTED Final   Coronavirus 229E NOT DETECTED NOT DETECTED Final    Comment: (NOTE) The Coronavirus on the Respiratory Panel, DOES NOT test for the novel  Coronavirus (2019 nCoV)    Coronavirus HKU1 NOT DETECTED NOT DETECTED Final   Coronavirus NL63 NOT DETECTED NOT DETECTED Final   Coronavirus OC43 NOT DETECTED NOT DETECTED Final   Metapneumovirus NOT DETECTED NOT DETECTED Final   Rhinovirus / Enterovirus NOT DETECTED NOT DETECTED Final   Influenza A NOT DETECTED NOT DETECTED Final   Influenza B NOT DETECTED NOT DETECTED Final   Parainfluenza Virus 1 NOT DETECTED NOT DETECTED Final   Parainfluenza Virus 2 NOT DETECTED NOT DETECTED Final   Parainfluenza Virus 3 NOT DETECTED NOT DETECTED Final   Parainfluenza Virus 4 NOT DETECTED NOT DETECTED Final   Respiratory Syncytial Virus NOT DETECTED NOT DETECTED Final   Bordetella pertussis NOT DETECTED NOT DETECTED Final   Bordetella Parapertussis NOT DETECTED NOT DETECTED Final   Chlamydophila pneumoniae NOT DETECTED NOT DETECTED Final   Mycoplasma pneumoniae NOT DETECTED NOT DETECTED Final    Comment: Performed at [facility], [place]. [address] [place], [place] [address]   MRSA Next Gen by PCR, Nasal     Status: None   Collection Time: 05/16/22  9:33 PM   Specimen: Nasal Mucosa; Nasal Swab  Result Value Ref Range Status   MRSA by PCR Next Gen NOT DETECTED NOT DETECTED Final    Comment: (NOTE) The GeneXpert MRSA Assay (FDA approved for NASAL specimens only), is one component of a comprehensive MRSA colonization surveillance program. It is not intended to diagnose MRSA infection nor to guide or monitor treatment for MRSA infections. Test performance is not FDA approved in patients less than 5 years old. Performed at [facility], [place] [address] [place], [place] [address]      Labs: BNP (last 3 results) Recent Labs  09/04/21 1215  BNP [id number]*   Basic Metabolic Panel: Recent Labs  Lab 05/16/22 1150 05/16/22 1246 05/16/22 1755 05/17/22 0255 05/18/22 0240  NA 135  --   --  139 137  K 5.9* 6.1* 4.7 5.0 4.9  CL 107  --   --  109 110  CO2 20*  --   --  22 21*  GLUCOSE 105*  --   --  90 92  BUN 35*  --   --  26* 28*  CREATININE 1.82*  --   --  1.56* 1.58*  CALCIUM 9.2  --   --  7.9* 7.6*   Liver Function Tests: Recent Labs  Lab 05/16/22 1150  AST 14*  ALT 7  ALKPHOS 53  BILITOT 0.7  PROT 7.2  ALBUMIN 4.0   Recent Labs  Lab 05/16/22 1150  LIPASE 19   No results for input(s): "AMMONIA" in the last 168 hours. CBC: Recent Labs  Lab 05/12/22 1445 05/16/22 1150 05/17/22 0255 05/18/22 0240  WBC 5.0 11.2* 7.9 5.3  NEUTROABS 2.7  --   --   --   HGB 11.5* 11.5* 10.4* 9.8*  HCT 32.2* 32.0* 30.9* 29.2*  MCV 93.1 91.7 94.8 95.4  PLT 143* 127* 136* 120*   Cardiac Enzymes: No results for input(s): "CKTOTAL", "CKMB", "CKMBINDEX", "TROPONINI" in the last 168 hours. BNP: Invalid input(s): "POCBNP" CBG: No results for input(s): "GLUCAP" in the last 168 hours. D-Dimer No results for input(s): "DDIMER" in the last 72 hours. Hgb A1c No results for input(s): "HGBA1C" in the last 72 hours. Lipid Profile No results  for input(s): "CHOL", "HDL", "LDLCALC", "TRIG", "CHOLHDL", "LDLDIRECT" in the last 72 hours. Thyroid function studies No results for input(s): "TSH", "T4TOTAL", "T3FREE", "THYROIDAB" in the last 72 hours.  Invalid input(s): "FREET3" Anemia work up No results for input(s): "VITAMINB12", "FOLATE", "FERRITIN", "TIBC", "IRON", "RETICCTPCT" in the last 72 hours. Urinalysis    Component Value Date/Time   COLORURINE COLORLESS (A) 05/16/2022 1442   APPEARANCEUR CLEAR 05/16/2022 1442   LABSPEC 1.008 05/16/2022 1442   PHURINE 6.5 05/16/2022 1442   GLUCOSEU NEGATIVE 05/16/2022 1442   HGBUR NEGATIVE 05/16/2022 1442   BILIRUBINUR NEGATIVE 05/16/2022 1442   BILIRUBINUR small 08/04/2013 0836   KETONESUR NEGATIVE 05/16/2022 1442   PROTEINUR NEGATIVE 05/16/2022 1442   UROBILINOGEN 1.0 08/04/2013 0836   UROBILINOGEN 1.0 06/10/2008 1855   NITRITE NEGATIVE 05/16/2022 1442   LEUKOCYTESUR NEGATIVE 05/16/2022 1442   Sepsis Labs Recent Labs  Lab 05/12/22 1445 05/16/22 1150 05/17/22 0255 05/18/22 0240  WBC 5.0 11.2* 7.9 5.3   Microbiology Recent Results (from the past 240 hour(s))  Blood culture (routine x 2)     Status: None (Preliminary result)   Collection Time: 05/16/22 12:15 PM   Specimen: BLOOD  Result Value Ref Range Status   Specimen Description   Final    BLOOD RIGHT ANTECUBITAL Performed at [facility], [place] [address] [place], [place] [address]    Special Requests   Final    BOTTLES DRAWN AEROBIC AND ANAEROBIC Blood Culture adequate volume Performed at [facility], [address], [place], [place] [address]    Culture   Final    NO GROWTH 2 DAYS Performed at [facility], [place] [address] [place],  [address]    Report Status PENDING  Incomplete  Blood culture (routine x 2)     Status: None (Preliminary result)   Collection Time: 05/16/22 12:20 PM   Specimen: BLOOD RIGHT  HAND  Result Value Ref Range Status   Specimen Description    Final    BLOOD RIGHT HAND Performed at [facility], [address] [place], [place] [address]    Special Requests   Final    BOTTLES DRAWN AEROBIC AND ANAEROBIC Blood Culture adequate volume Performed at [facility], [address], [place], [place] [address]    Culture   Final    NO GROWTH 2 DAYS Performed at [facility], [place] [address], [place], [place] [address]    Report Status PENDING  Incomplete  Respiratory (~20 pathogens) panel by PCR     Status: None   Collection Time: 05/16/22  5:55 PM   Specimen: Nasopharyngeal Swab; Respiratory  Result Value Ref Range Status   Adenovirus NOT DETECTED NOT DETECTED Final   Coronavirus 229E NOT DETECTED NOT DETECTED Final    Comment: (NOTE) The Coronavirus on the Respiratory Panel, DOES NOT test for the novel  Coronavirus (2019 nCoV)    Coronavirus HKU1 NOT DETECTED NOT DETECTED Final   Coronavirus NL63 NOT DETECTED NOT DETECTED Final   Coronavirus OC43 NOT DETECTED NOT DETECTED Final   Metapneumovirus NOT DETECTED NOT DETECTED Final   Rhinovirus / Enterovirus NOT DETECTED NOT DETECTED Final   Influenza A NOT DETECTED NOT DETECTED Final   Influenza B NOT DETECTED NOT DETECTED Final   Parainfluenza Virus 1 NOT DETECTED NOT DETECTED Final   Parainfluenza Virus 2 NOT DETECTED NOT DETECTED Final   Parainfluenza Virus 3 NOT DETECTED NOT DETECTED Final   Parainfluenza Virus 4 NOT DETECTED NOT DETECTED Final   Respiratory Syncytial Virus NOT DETECTED NOT DETECTED Final   Bordetella pertussis NOT DETECTED NOT DETECTED Final   Bordetella Parapertussis NOT DETECTED NOT DETECTED Final   Chlamydophila pneumoniae NOT DETECTED NOT DETECTED Final   Mycoplasma pneumoniae NOT DETECTED NOT DETECTED Final    Comment: Performed at [facility], [place]. [address] [place], [place] [address]  MRSA Next Gen by PCR, Nasal     Status: None   Collection Time: 05/16/22  9:33 PM   Specimen: Nasal Mucosa;  Nasal Swab  Result Value Ref Range Status   MRSA by PCR Next Gen NOT DETECTED NOT DETECTED Final    Comment: (NOTE) The GeneXpert MRSA Assay (FDA approved for NASAL specimens only), is one component of a comprehensive MRSA colonization surveillance program. It is not intended to diagnose MRSA infection nor to guide or monitor treatment for MRSA infections. Test performance is not FDA approved in patients less than 17 years old. Performed at [facility], [place] [address], [place], [place] [address]     Please note: You were cared for by a hospitalist during your hospital stay. Once you are discharged, your primary care physician will handle any further medical issues. Please note that NO REFILLS for any discharge medications will be authorized once you are discharged, as it is imperative that you return to your primary care physician (or establish a relationship with a primary care physician if you do not have one) for your post hospital discharge needs so that they can reassess your need for medications and monitor your lab values.    Time coordinating discharge: 40 minutes  SIGNED:   Shelly Coss, MD  [facility] 05/18/2022, 10:52 AM Pager [phone]  If 7PM-7AM, please contact night-coverage [email or web address] Password [email or web address]

## 2022-05-18 NOTE — Progress Notes (Signed)
Discharge teaching complete. Meds, diet, activity, follow up appointments reviewed and all questions answered. Copy of instructions given to patient and prescriptions sent to pharmacy.  

## 2022-05-19 ENCOUNTER — Other Ambulatory Visit (HOSPITAL_COMMUNITY): Payer: Self-pay

## 2022-05-19 ENCOUNTER — Other Ambulatory Visit: Payer: Self-pay

## 2022-05-19 ENCOUNTER — Encounter (HOSPITAL_COMMUNITY): Payer: Self-pay

## 2022-05-19 DIAGNOSIS — Z79899 Other long term (current) drug therapy: Principal | ICD-10-CM

## 2022-05-19 DIAGNOSIS — Z944 Liver transplant status: Principal | ICD-10-CM

## 2022-05-20 ENCOUNTER — Other Ambulatory Visit (HOSPITAL_COMMUNITY): Payer: Self-pay

## 2022-05-21 LAB — CULTURE, BLOOD (ROUTINE X 2)
Culture: NO GROWTH
Culture: NO GROWTH
Special Requests: ADEQUATE
Special Requests: ADEQUATE

## 2022-05-22 ENCOUNTER — Other Ambulatory Visit (HOSPITAL_COMMUNITY): Payer: Self-pay

## 2022-05-26 ENCOUNTER — Other Ambulatory Visit (HOSPITAL_COMMUNITY): Payer: Self-pay

## 2022-05-26 DIAGNOSIS — Z79899 Other long term (current) drug therapy: Secondary | ICD-10-CM | POA: Diagnosis not present

## 2022-05-26 DIAGNOSIS — Z944 Liver transplant status: Secondary | ICD-10-CM | POA: Diagnosis not present

## 2022-05-28 ENCOUNTER — Other Ambulatory Visit: Payer: Self-pay

## 2022-05-28 ENCOUNTER — Other Ambulatory Visit (HOSPITAL_COMMUNITY): Payer: Self-pay

## 2022-05-28 DIAGNOSIS — Z79899 Other long term (current) drug therapy: Principal | ICD-10-CM

## 2022-05-28 DIAGNOSIS — Z944 Liver transplant status: Principal | ICD-10-CM

## 2022-05-28 DIAGNOSIS — E875 Hyperkalemia: Secondary | ICD-10-CM | POA: Diagnosis not present

## 2022-05-28 DIAGNOSIS — Z09 Encounter for follow-up examination after completed treatment for conditions other than malignant neoplasm: Secondary | ICD-10-CM | POA: Diagnosis not present

## 2022-05-28 DIAGNOSIS — I700 Atherosclerosis of aorta: Secondary | ICD-10-CM | POA: Diagnosis not present

## 2022-05-28 DIAGNOSIS — J189 Pneumonia, unspecified organism: Secondary | ICD-10-CM | POA: Diagnosis not present

## 2022-05-28 MED ORDER — ENVARSUS XR 1 MG TABLET,EXTENDED RELEASE
ORAL_TABLET | Freq: Every day | ORAL | 3 refills | 90 days | Status: CP
Start: 2022-05-28 — End: ?

## 2022-05-28 MED ORDER — ENVARSUS XR 1 MG PO TB24
ORAL_TABLET | ORAL | 3 refills | Status: DC
  Filled 2022-05-28 (×2): qty 90, 30d supply, fill #0
  Filled 2022-06-17: qty 90, 30d supply, fill #1
  Filled 2022-07-18: qty 90, 30d supply, fill #2
  Filled 2022-08-18: qty 90, 30d supply, fill #3

## 2022-06-02 ENCOUNTER — Inpatient Hospital Stay: Payer: Commercial Managed Care - PPO

## 2022-06-12 ENCOUNTER — Other Ambulatory Visit (HOSPITAL_COMMUNITY): Payer: Self-pay

## 2022-06-17 ENCOUNTER — Other Ambulatory Visit (HOSPITAL_COMMUNITY): Payer: Self-pay

## 2022-06-23 ENCOUNTER — Other Ambulatory Visit: Payer: Self-pay

## 2022-06-23 ENCOUNTER — Inpatient Hospital Stay: Payer: Commercial Managed Care - PPO

## 2022-06-23 ENCOUNTER — Inpatient Hospital Stay: Payer: Commercial Managed Care - PPO | Attending: Hematology and Oncology

## 2022-06-23 ENCOUNTER — Other Ambulatory Visit: Payer: Self-pay | Admitting: Hematology and Oncology

## 2022-06-23 VITALS — BP 145/85 | HR 62 | Temp 98.3°F | Resp 16

## 2022-06-23 DIAGNOSIS — D61818 Other pancytopenia: Secondary | ICD-10-CM | POA: Diagnosis present

## 2022-06-23 DIAGNOSIS — Z79899 Other long term (current) drug therapy: Secondary | ICD-10-CM | POA: Diagnosis not present

## 2022-06-23 DIAGNOSIS — D631 Anemia in chronic kidney disease: Secondary | ICD-10-CM

## 2022-06-23 DIAGNOSIS — D539 Nutritional anemia, unspecified: Secondary | ICD-10-CM

## 2022-06-23 LAB — CBC WITH DIFFERENTIAL/PLATELET
Abs Immature Granulocytes: 0.02 10*3/uL (ref 0.00–0.07)
Basophils Absolute: 0 10*3/uL (ref 0.0–0.1)
Basophils Relative: 1 %
Eosinophils Absolute: 0.6 10*3/uL — ABNORMAL HIGH (ref 0.0–0.5)
Eosinophils Relative: 11 %
HCT: 29.2 % — ABNORMAL LOW (ref 36.0–46.0)
Hemoglobin: 10.3 g/dL — ABNORMAL LOW (ref 12.0–15.0)
Immature Granulocytes: 0 %
Lymphocytes Relative: 24 %
Lymphs Abs: 1.4 10*3/uL (ref 0.7–4.0)
MCH: 33 pg (ref 26.0–34.0)
MCHC: 35.3 g/dL (ref 30.0–36.0)
MCV: 93.6 fL (ref 80.0–100.0)
Monocytes Absolute: 0.4 10*3/uL (ref 0.1–1.0)
Monocytes Relative: 7 %
Neutro Abs: 3.3 10*3/uL (ref 1.7–7.7)
Neutrophils Relative %: 57 %
Platelets: 172 10*3/uL (ref 150–400)
RBC: 3.12 MIL/uL — ABNORMAL LOW (ref 3.87–5.11)
RDW: 12.3 % (ref 11.5–15.5)
WBC: 5.8 10*3/uL (ref 4.0–10.5)
nRBC: 0 % (ref 0.0–0.2)

## 2022-06-23 MED ORDER — DARBEPOETIN ALFA 200 MCG/0.4ML IJ SOSY
200.0000 ug | PREFILLED_SYRINGE | Freq: Once | INTRAMUSCULAR | Status: AC
  Administered 2022-06-23: 200 ug via SUBCUTANEOUS
  Filled 2022-06-23: qty 0.4

## 2022-06-27 DIAGNOSIS — Z79899 Other long term (current) drug therapy: Principal | ICD-10-CM

## 2022-06-27 DIAGNOSIS — Z944 Liver transplant status: Principal | ICD-10-CM

## 2022-07-01 ENCOUNTER — Other Ambulatory Visit (HOSPITAL_COMMUNITY): Payer: Self-pay

## 2022-07-02 ENCOUNTER — Other Ambulatory Visit (HOSPITAL_COMMUNITY): Payer: Self-pay

## 2022-07-02 MED ORDER — ESTRADIOL 0.05 MG/24HR TD PTTW
1.0000 | MEDICATED_PATCH | TRANSDERMAL | 3 refills | Status: AC
  Filled 2022-07-02: qty 24, 84d supply, fill #0
  Filled 2022-11-12: qty 24, 84d supply, fill #1

## 2022-07-03 ENCOUNTER — Other Ambulatory Visit (HOSPITAL_COMMUNITY): Payer: Self-pay

## 2022-07-07 DIAGNOSIS — Z944 Liver transplant status: Secondary | ICD-10-CM | POA: Diagnosis not present

## 2022-07-07 DIAGNOSIS — Z79899 Other long term (current) drug therapy: Secondary | ICD-10-CM | POA: Diagnosis not present

## 2022-07-09 DIAGNOSIS — L578 Other skin changes due to chronic exposure to nonionizing radiation: Secondary | ICD-10-CM | POA: Diagnosis not present

## 2022-07-09 DIAGNOSIS — L304 Erythema intertrigo: Secondary | ICD-10-CM | POA: Diagnosis not present

## 2022-07-09 DIAGNOSIS — L813 Cafe au lait spots: Secondary | ICD-10-CM | POA: Diagnosis not present

## 2022-07-09 DIAGNOSIS — L719 Rosacea, unspecified: Secondary | ICD-10-CM | POA: Diagnosis not present

## 2022-07-09 DIAGNOSIS — L814 Other melanin hyperpigmentation: Secondary | ICD-10-CM | POA: Diagnosis not present

## 2022-07-10 ENCOUNTER — Other Ambulatory Visit (HOSPITAL_COMMUNITY): Payer: Self-pay

## 2022-07-10 ENCOUNTER — Other Ambulatory Visit: Payer: Self-pay

## 2022-07-10 MED ORDER — NYSTATIN 100000 UNIT/GM EX CREA
TOPICAL_CREAM | CUTANEOUS | 0 refills | Status: DC
  Filled 2022-07-10: qty 30, 30d supply, fill #0

## 2022-07-11 ENCOUNTER — Other Ambulatory Visit: Payer: Self-pay

## 2022-07-11 DIAGNOSIS — Z944 Liver transplant status: Secondary | ICD-10-CM | POA: Diagnosis not present

## 2022-07-11 DIAGNOSIS — R0789 Other chest pain: Secondary | ICD-10-CM | POA: Diagnosis not present

## 2022-07-14 ENCOUNTER — Other Ambulatory Visit (HOSPITAL_COMMUNITY): Payer: Self-pay

## 2022-07-14 ENCOUNTER — Inpatient Hospital Stay: Payer: Commercial Managed Care - PPO

## 2022-07-14 ENCOUNTER — Encounter: Payer: Self-pay | Admitting: Hematology and Oncology

## 2022-07-14 ENCOUNTER — Inpatient Hospital Stay: Payer: Commercial Managed Care - PPO | Admitting: Hematology and Oncology

## 2022-07-14 MED ORDER — NAPROXEN SODIUM 550 MG PO TABS
ORAL_TABLET | ORAL | 0 refills | Status: DC
  Filled 2022-07-14: qty 30, 15d supply, fill #0

## 2022-07-15 ENCOUNTER — Other Ambulatory Visit: Payer: Self-pay

## 2022-07-18 ENCOUNTER — Other Ambulatory Visit (HOSPITAL_COMMUNITY): Payer: Self-pay

## 2022-07-23 ENCOUNTER — Other Ambulatory Visit (HOSPITAL_COMMUNITY): Payer: Self-pay

## 2022-07-31 ENCOUNTER — Other Ambulatory Visit (HOSPITAL_COMMUNITY): Payer: Self-pay

## 2022-08-04 ENCOUNTER — Other Ambulatory Visit (HOSPITAL_COMMUNITY): Payer: Self-pay

## 2022-08-14 ENCOUNTER — Other Ambulatory Visit (HOSPITAL_COMMUNITY): Payer: Self-pay

## 2022-08-18 ENCOUNTER — Other Ambulatory Visit: Payer: Self-pay

## 2022-08-18 ENCOUNTER — Other Ambulatory Visit (HOSPITAL_COMMUNITY): Payer: Self-pay

## 2022-08-19 ENCOUNTER — Other Ambulatory Visit: Payer: Self-pay

## 2022-08-21 ENCOUNTER — Other Ambulatory Visit (HOSPITAL_COMMUNITY): Payer: Self-pay

## 2022-09-03 DIAGNOSIS — Z79899 Other long term (current) drug therapy: Secondary | ICD-10-CM | POA: Diagnosis not present

## 2022-09-03 DIAGNOSIS — Z944 Liver transplant status: Secondary | ICD-10-CM | POA: Diagnosis not present

## 2022-09-05 ENCOUNTER — Other Ambulatory Visit: Payer: Self-pay | Admitting: Oncology

## 2022-09-05 ENCOUNTER — Other Ambulatory Visit: Payer: Self-pay

## 2022-09-05 ENCOUNTER — Other Ambulatory Visit (HOSPITAL_COMMUNITY): Payer: Self-pay

## 2022-09-05 DIAGNOSIS — Z944 Liver transplant status: Principal | ICD-10-CM

## 2022-09-05 DIAGNOSIS — Z79899 Other long term (current) drug therapy: Principal | ICD-10-CM

## 2022-09-05 DIAGNOSIS — Z006 Encounter for examination for normal comparison and control in clinical research program: Secondary | ICD-10-CM

## 2022-09-05 MED ORDER — ENVARSUS XR 1 MG TABLET,EXTENDED RELEASE
ORAL_TABLET | Freq: Every day | ORAL | 3 refills | 90 days | Status: CP
Start: 2022-09-05 — End: ?

## 2022-09-05 MED ORDER — ENVARSUS XR 1 MG PO TB24
ORAL_TABLET | ORAL | 3 refills | Status: DC
  Filled 2022-09-10: qty 180, 90d supply, fill #0

## 2022-09-10 ENCOUNTER — Other Ambulatory Visit: Payer: Self-pay

## 2022-09-10 ENCOUNTER — Other Ambulatory Visit (HOSPITAL_COMMUNITY): Payer: Self-pay

## 2022-09-11 ENCOUNTER — Other Ambulatory Visit (HOSPITAL_COMMUNITY): Payer: Self-pay

## 2022-09-12 ENCOUNTER — Other Ambulatory Visit (HOSPITAL_COMMUNITY): Payer: Self-pay

## 2022-09-15 ENCOUNTER — Other Ambulatory Visit: Payer: Self-pay

## 2022-09-15 ENCOUNTER — Other Ambulatory Visit (HOSPITAL_COMMUNITY): Payer: Self-pay

## 2022-09-15 DIAGNOSIS — Z79899 Other long term (current) drug therapy: Principal | ICD-10-CM

## 2022-09-15 DIAGNOSIS — Z944 Liver transplant status: Principal | ICD-10-CM

## 2022-09-22 DIAGNOSIS — Z79899 Other long term (current) drug therapy: Secondary | ICD-10-CM | POA: Diagnosis not present

## 2022-09-22 DIAGNOSIS — Z944 Liver transplant status: Secondary | ICD-10-CM | POA: Diagnosis not present

## 2022-09-24 DIAGNOSIS — Z13820 Encounter for screening for osteoporosis: Secondary | ICD-10-CM | POA: Diagnosis not present

## 2022-09-24 DIAGNOSIS — Z1231 Encounter for screening mammogram for malignant neoplasm of breast: Secondary | ICD-10-CM | POA: Diagnosis not present

## 2022-09-24 LAB — HM DEXA SCAN

## 2022-09-26 ENCOUNTER — Other Ambulatory Visit: Payer: Self-pay

## 2022-09-26 DIAGNOSIS — K869 Disease of pancreas, unspecified: Principal | ICD-10-CM

## 2022-09-26 DIAGNOSIS — Z944 Liver transplant status: Principal | ICD-10-CM

## 2022-09-29 DIAGNOSIS — Z944 Liver transplant status: Principal | ICD-10-CM

## 2022-09-29 DIAGNOSIS — Z79899 Other long term (current) drug therapy: Principal | ICD-10-CM

## 2022-10-02 DIAGNOSIS — Z79899 Other long term (current) drug therapy: Principal | ICD-10-CM

## 2022-10-02 DIAGNOSIS — Z944 Liver transplant status: Principal | ICD-10-CM

## 2022-10-03 ENCOUNTER — Other Ambulatory Visit: Payer: Self-pay

## 2022-10-03 ENCOUNTER — Other Ambulatory Visit (HOSPITAL_COMMUNITY): Payer: Self-pay | Admitting: Transplant Hepatology

## 2022-10-03 DIAGNOSIS — K869 Disease of pancreas, unspecified: Secondary | ICD-10-CM

## 2022-10-03 DIAGNOSIS — Z944 Liver transplant status: Secondary | ICD-10-CM

## 2022-10-06 ENCOUNTER — Other Ambulatory Visit (HOSPITAL_COMMUNITY): Payer: Self-pay

## 2022-10-08 ENCOUNTER — Ambulatory Visit (HOSPITAL_COMMUNITY)
Admission: RE | Admit: 2022-10-08 | Discharge: 2022-10-08 | Disposition: A | Discharge status: Home / Self Care | Payer: Commercial Managed Care - PPO | Source: Ambulatory Visit | Attending: Transplant Hepatology | Admitting: Transplant Hepatology

## 2022-10-08 ENCOUNTER — Other Ambulatory Visit (HOSPITAL_COMMUNITY): Payer: Self-pay | Admitting: Transplant Hepatology

## 2022-10-08 DIAGNOSIS — K869 Disease of pancreas, unspecified: Secondary | ICD-10-CM | POA: Diagnosis not present

## 2022-10-08 DIAGNOSIS — Z944 Liver transplant status: Secondary | ICD-10-CM | POA: Insufficient documentation

## 2022-10-08 DIAGNOSIS — K862 Cyst of pancreas: Secondary | ICD-10-CM | POA: Diagnosis not present

## 2022-10-08 DIAGNOSIS — Z9049 Acquired absence of other specified parts of digestive tract: Secondary | ICD-10-CM | POA: Diagnosis not present

## 2022-10-08 MED ORDER — HEPARIN SOD (PORK) LOCK FLUSH 100 UNIT/ML IV SOLN: 100.0000 [IU] | Freq: Once | INTRAVENOUS | Status: DC

## 2022-10-08 MED ORDER — GADOBUTROL 1 MMOL/ML IV SOLN
8.0000 mL | Freq: Once | INTRAVENOUS | Status: AC | PRN
  Administered 2022-10-08: 8 mL via INTRAVENOUS

## 2022-10-12 ENCOUNTER — Ambulatory Visit (HOSPITAL_COMMUNITY): Admission: RE | Admit: 2022-10-12 | Payer: Commercial Managed Care - PPO | Source: Ambulatory Visit

## 2022-10-13 DIAGNOSIS — Z944 Liver transplant status: Principal | ICD-10-CM

## 2022-10-13 DIAGNOSIS — Z79899 Other long term (current) drug therapy: Principal | ICD-10-CM

## 2022-10-16 ENCOUNTER — Other Ambulatory Visit (HOSPITAL_COMMUNITY): Payer: Self-pay

## 2022-10-17 ENCOUNTER — Other Ambulatory Visit (HOSPITAL_COMMUNITY): Payer: Self-pay

## 2022-10-17 ENCOUNTER — Other Ambulatory Visit: Payer: Self-pay

## 2022-10-17 MED ORDER — SUCRALFATE 1 G PO TABS
1.0000 g | ORAL_TABLET | Freq: Four times a day (QID) | ORAL | 1 refills | Status: AC
Start: 2022-10-17 — End: ?
  Filled 2022-10-17: qty 56, 14d supply, fill #0
  Filled 2022-12-15: qty 56, 14d supply, fill #1

## 2022-10-20 ENCOUNTER — Other Ambulatory Visit (HOSPITAL_COMMUNITY): Payer: Self-pay

## 2022-10-20 ENCOUNTER — Ambulatory Visit: Admit: 2022-10-20 | Discharge: 2022-10-21 | Payer: PRIVATE HEALTH INSURANCE

## 2022-10-20 DIAGNOSIS — T8649 Other complications of liver transplant: Principal | ICD-10-CM

## 2022-10-20 DIAGNOSIS — Z944 Liver transplant status: Principal | ICD-10-CM

## 2022-10-20 DIAGNOSIS — K831 Obstruction of bile duct: Principal | ICD-10-CM

## 2022-10-20 DIAGNOSIS — K8050 Calculus of bile duct without cholangitis or cholecystitis without obstruction: Principal | ICD-10-CM

## 2022-10-20 DIAGNOSIS — K8051 Calculus of bile duct without cholangitis or cholecystitis with obstruction: Secondary | ICD-10-CM | POA: Diagnosis not present

## 2022-10-20 MED ORDER — URSODIOL 300 MG CAPSULE
ORAL_CAPSULE | Freq: Two times a day (BID) | ORAL | 3 refills | 90 days | Status: CP
Start: 2022-10-20 — End: ?

## 2022-10-20 MED ORDER — URSODIOL 300 MG PO CAPS
600.0000 mg | ORAL_CAPSULE | Freq: Two times a day (BID) | ORAL | 3 refills | Status: DC
  Filled 2022-10-20: qty 360, 90d supply, fill #0

## 2022-10-27 DIAGNOSIS — Z944 Liver transplant status: Principal | ICD-10-CM

## 2022-10-27 DIAGNOSIS — Z79899 Other long term (current) drug therapy: Principal | ICD-10-CM

## 2022-11-10 DIAGNOSIS — Z79899 Other long term (current) drug therapy: Principal | ICD-10-CM

## 2022-11-10 DIAGNOSIS — Z944 Liver transplant status: Principal | ICD-10-CM

## 2022-11-12 ENCOUNTER — Other Ambulatory Visit (HOSPITAL_COMMUNITY): Payer: Self-pay

## 2022-11-12 ENCOUNTER — Other Ambulatory Visit: Payer: Self-pay | Admitting: Cardiology

## 2022-11-13 ENCOUNTER — Other Ambulatory Visit (HOSPITAL_BASED_OUTPATIENT_CLINIC_OR_DEPARTMENT_OTHER): Payer: Self-pay

## 2022-11-13 ENCOUNTER — Other Ambulatory Visit: Payer: Self-pay

## 2022-11-13 MED ORDER — CARVEDILOL 25 MG PO TABS
12.5000 mg | ORAL_TABLET | Freq: Two times a day (BID) | ORAL | 1 refills | Status: AC
  Filled 2022-11-13: qty 30, 30d supply, fill #0
  Filled 2022-12-15: qty 30, 30d supply, fill #1

## 2022-11-15 ENCOUNTER — Encounter (HOSPITAL_COMMUNITY): Payer: Self-pay

## 2022-11-19 ENCOUNTER — Other Ambulatory Visit: Payer: Self-pay

## 2022-11-19 ENCOUNTER — Other Ambulatory Visit (INDEPENDENT_AMBULATORY_CARE_PROVIDER_SITE_OTHER): Payer: Commercial Managed Care - PPO

## 2022-11-19 DIAGNOSIS — E559 Vitamin D deficiency, unspecified: Secondary | ICD-10-CM

## 2022-11-19 DIAGNOSIS — R635 Abnormal weight gain: Secondary | ICD-10-CM

## 2022-11-19 DIAGNOSIS — Z944 Liver transplant status: Secondary | ICD-10-CM

## 2022-11-19 DIAGNOSIS — M818 Other osteoporosis without current pathological fracture: Secondary | ICD-10-CM | POA: Diagnosis not present

## 2022-11-19 LAB — RENAL FUNCTION PANEL
Albumin: 4.2 g/dL (ref 3.5–5.2)
BUN: 36 mg/dL — ABNORMAL HIGH (ref 6–23)
CO2: 21 mEq/L (ref 19–32)
Calcium: 9.4 mg/dL (ref 8.4–10.5)
Chloride: 108 mEq/L (ref 96–112)
Creatinine, Ser: 1.38 mg/dL — ABNORMAL HIGH (ref 0.40–1.20)
GFR: 43.95 mL/min — ABNORMAL LOW (ref >60.00)
Glucose, Bld: 103 mg/dL — ABNORMAL HIGH (ref 70–99)
Phosphorus: 3.7 mg/dL (ref 2.3–4.6)
Potassium: 4.6 mEq/L (ref 3.5–5.1)
Sodium: 139 mEq/L (ref 135–145)

## 2022-11-19 LAB — MAGNESIUM: Magnesium: 1.7 mg/dL (ref 1.5–2.5)

## 2022-11-19 LAB — VITAMIN D 25 HYDROXY (VIT D DEFICIENCY, FRACTURES): VITD: 25.82 ng/mL — ABNORMAL LOW (ref 30.00–100.00)

## 2022-11-20 LAB — PTH, INTACT AND CALCIUM
Calcium: 9.5 mg/dL (ref 8.6–10.4)
PTH: 44 pg/mL (ref 16–77)

## 2022-11-21 ENCOUNTER — Other Ambulatory Visit: Payer: 59

## 2022-11-24 ENCOUNTER — Ambulatory Visit: Payer: 59 | Admitting: Endocrinology

## 2022-11-24 DIAGNOSIS — Z79899 Other long term (current) drug therapy: Principal | ICD-10-CM

## 2022-11-24 DIAGNOSIS — Z944 Liver transplant status: Principal | ICD-10-CM

## 2022-11-26 ENCOUNTER — Ambulatory Visit: Payer: Commercial Managed Care - PPO | Admitting: Endocrinology

## 2022-11-26 ENCOUNTER — Encounter: Payer: Self-pay | Admitting: Endocrinology

## 2022-11-26 VITALS — BP 130/80 | HR 75 | Ht 61.5 in | Wt 188.4 lb

## 2022-11-26 DIAGNOSIS — E559 Vitamin D deficiency, unspecified: Secondary | ICD-10-CM

## 2022-11-26 DIAGNOSIS — M818 Other osteoporosis without current pathological fracture: Secondary | ICD-10-CM | POA: Diagnosis not present

## 2022-11-26 DIAGNOSIS — Z944 Liver transplant status: Secondary | ICD-10-CM

## 2022-11-26 DIAGNOSIS — N1832 Chronic kidney disease, stage 3b: Secondary | ICD-10-CM | POA: Diagnosis not present

## 2022-11-26 NOTE — Progress Notes (Signed)
Outpatient Endocrinology Note [place] Sache Sane, MD  11/26/22  Patient's Name: Christine Butler    DOB: 1970/03/14    MRN: [id number]  REASON OF VISIT: Follow up of osteoporosis  PCP: Alyson Ingles, PA-C  HISTORY OF PRESENT ILLNESS:   Christine Butler is a 52 y.o. old female with past medical history listed below, is here for follow up of bone health issues / osteoporosis.  Pertinent Bone Health History: Patient had a screening bone density done in January 2020 because of chronic liver disease as pretransplant evaluation.  At that time a T-spine Z-score was -2.4 and T-score was -3.0. The total bone mineral density in the proximal left femur measures 0.667 gm/cm2.  The Z score is -1.9 and the T score is -2.3.  This value is below the fracture risk threshold.   The femoral neck density is 0.532 gm/cm2, and the T score is -2.9.  The other T scores range from -2.4 to -1.6.    - BONE DENSITY in 09/2020 done elsewhere shows the following, Lumbar spine T-score -2.8, Total hip T score = -2.8 Left femoral neck T score = -3.4, no significant change.  -Patient has been on long-term steroid use post liver transplant.  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   Bone Health Concerns:   No history of fragility fracture.  Mild L5 compression deformity noticed on MRI in January 2020 unchanged from prior. Patient was on long-term steroid post liver transplant currently not on prednisone.  She has been on long-term use of PPI/pantoprazole.    She had a surgical menopause with total hysterectomy in March 2023.  She is currently on estradiol patch following with OB/GYN.  Patient was treated with Reclast infusion first dose was in October 2020, second dose in October 2022 and third dose was in October 2023.  Calcium intake from supplements: Citracal 1200 mg daily.  Dietary calcium intake: yogurt daily.  Current vitamin D intake: 2000 international units  daily. Multivitamin 1 tab daily. Vit D 3 2000 international unit daily from combined with calcium and multivitamin.   Relevant comorbidities: No history of bone cancer. No history of external radiation treatment.   No history of diabetes mellitus.  No Rheumatoid arthritis. She has history of GERD.  No history of recent invasive dental procedures. Not planning dental procedures in the near future.  Patient had DEXA scan in September 24, 2022 in outside facility at GYN office: Patient brought the report and reviewed as follows. AP spine L1-L4 T-score -1.9, femoral neck left T-score -2.7, femoral neck right T-score -2.7: Consistent with osteoporosis.  Interval history 11/26/22 Patient presented for annual follow-up.  Patient had DEXA scan as reviewed above and in July 2024.  Patient has been taking calcium and vitamin D supplement as noted above.  She had Reclast last injection in October 2023.  She has no fall and fracture.  She has history of liver transplant and following with transplant team at [facility].  She had recent labs reviewed with low vitamin D level 25.  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  Serum calcium normal.  Electrolytes normal.  PTH 44.  No other complaints today.   REVIEW OF SYSTEMS:  As per history of present illness.   PAST MEDICAL HISTORY: Past Medical History:  Diagnosis Date   (HFpEF) heart failure with preserved ejection fraction (HCC) 08/09/2019   Acute blood loss anemia 04/20/2017   Altered mental status 08/14/2019  Arthritis    right knee   Ascites--mild this admit 11/06/2017   Atypical chest pain 11/05/2017   Atypical squamous cells of undetermined significance (ASCUS) on Papanicolaou smear of cervix 08/05/2018   CAD (coronary artery disease)    a. s/p CABGx2 in 02/2017 (LAD not suitable for PCI), EF normal.   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/2019   Chronic low back pain    Cirrhosis (HCC) 03/16/2017   Coronary artery disease 03/24/2017   COVID-19 09/18/2020   Current episode of major depressive disorder without prior episode 03/13/2020   Dyslipidemia, goal LDL below 70 10/13/2017   Elevated LFTs 04/20/2017   Encephalopathy 09/29/2019   hepatic encephalopathy   Familial hyperlipidemia    GERD (gastroesophageal reflux disease)    GI bleed 02/01/2018   H/O LEEP 03/30/2019   H/O two vessel coronary artery bypass graft 03/30/2019   High grade squamous intraepithelial lesion (HGSIL) on cytologic smear of cervix 05/04/2019   History of gastrostomy, has currently [facility]) 07/12/2020   Formatting of this note might be different from the original. Patient underwent ERCP 07/11/20. Procedure c/b hepaticogastrostomy stent placement.   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   Status post liver transplantation (HCC) 03/30/2019   SVD (spontaneous vaginal delivery)    x 3   Upper GI bleed 02/01/2018   Urinary incontinence    occ   Urinary tract bacterial infections last oct 2021   Wears  glasses     PAST SURGICAL HISTORY: Past Surgical History:  Procedure Laterality Date   CERVICAL CONIZATION W/BX N/A 03/22/2020   Procedure: CONIZATION CERVIX WITH BIOPSY;  Surgeon: Marcelle Overlie, MD;  Location: [facility];  Service: Gynecology;  Laterality: N/A;   CHOLECYSTECTOMY  09/2019   COLPOSCOPY N/A 03/22/2020   Procedure: COLPOSCOPY;  Surgeon: Marcelle Overlie, MD;  Location: [facility];  Service: Gynecology;  Laterality: N/A;   CORONARY ARTERY BYPASS GRAFT N/A 03/24/2017   Procedure: CORONARY ARTERY BYPASS GRAFTING (CABG) x two, using left internal mammary artery, left radial artery, and right leg greater saphenous vein harvested endoscopically;  Surgeon: Kerin Perna, MD;  Location: [facility] OR;  Service: Open Heart Surgery;  Laterality: N/A;   ENDOVEIN HARVEST OF GREATER SAPHENOUS VEIN Right 03/24/2017   Procedure: ENDOVEIN HARVEST OF GREATER SAPHENOUS VEIN;  Surgeon: Kerin Perna, MD;  Location: [facility] OR;  Service: Open Heart Surgery;  Laterality: Right;   ESOPHAGEAL  BANDING  02/01/2018   Procedure: ESOPHAGEAL BANDING;  Surgeon: Kerin Salen, MD;  Location: [facility];  Service: Gastroenterology;;   ESOPHAGEAL BANDING N/A 03/29/2018   Procedure: ESOPHAGEAL BANDING;  Surgeon: Kerin Salen, MD;  Location: [facility];  Service: Gastroenterology;  Laterality: N/A;   ESOPHAGEAL BANDING N/A 05/21/2018   Procedure: ESOPHAGEAL BANDING;  Surgeon: Kerin Salen, MD;  Location: [facility];  Service: Gastroenterology;  Laterality: N/A;   ESOPHAGEAL BANDING N/A 10/11/2018   Procedure: ESOPHAGEAL BANDING;  Surgeon: Kerin Salen, MD;  Location: [facility];  Service: Gastroenterology;  Laterality: N/A;   ESOPHAGOGASTRODUODENOSCOPY N/A 03/29/2018   Procedure: ESOPHAGOGASTRODUODENOSCOPY (EGD);  Surgeon: Kerin Salen, MD;  Location: [facility];  Service: Gastroenterology;  Laterality: N/A;   ESOPHAGOGASTRODUODENOSCOPY N/A 05/21/2018   Procedure: ESOPHAGOGASTRODUODENOSCOPY  (EGD);  Surgeon: Kerin Salen, MD;  Location: [facility];  Service: Gastroenterology;  Laterality: N/A;   ESOPHAGOGASTRODUODENOSCOPY (EGD) WITH PROPOFOL N/A 02/01/2018   Procedure: ESOPHAGOGASTRODUODENOSCOPY (EGD) WITH PROPOFOL;  Surgeon: Kerin Salen, MD;  Location: [facility];  Service: Gastroenterology;  Laterality: N/A;   ESOPHAGOGASTRODUODENOSCOPY (EGD) WITH PROPOFOL N/A 10/11/2018   Procedure: ESOPHAGOGASTRODUODENOSCOPY (EGD) WITH PROPOFOL;  Surgeon: Kerin Salen, MD;  Location: [facility];  Service: Gastroenterology;  Laterality: N/A;   HYSTERECTOMY ABDOMINAL WITH SALPINGO-OOPHORECTOMY Bilateral 05/13/2021   Procedure: TOTAL ABDOMINAL HYSTERECTOMY, BILATERAL SALPINGO OPPHORECTOMY;  Surgeon: Marcelle Overlie, MD;  Location: [facility] OR;  Service: Gynecology;  Laterality: Bilateral;   INCONTINENCE SURGERY  2009   urinary  bladder sling   IR IMAGING GUIDED PORT INSERTION  04/08/2019   LEEP N/A 03/28/2014   Procedure: LOOP ELECTROSURGICAL EXCISION PROCEDURE (LEEP) cone biopsy;  Surgeon: Jeani Hawking, MD;  Location: [facility];  Service: Gynecology;  Laterality: N/A;   LEEP N/A 03/22/2020   Procedure: LOOP ELECTROSURGICAL EXCISION PROCEDURE (LEEP);  Surgeon: Marcelle Overlie, MD;  [facility];  Service: Gynecology;  Laterality: N/A;   LEFT HEART CATH AND CORONARY ANGIOGRAPHY N/A 03/23/2017   Procedure: LEFT HEART CATH AND CORONARY ANGIOGRAPHY;  Surgeon: Corky Crafts, MD;  Location: [facility];  Service: Cardiovascular;  Laterality: N/A;   LIVER BIOPSY     x 2   LIVER TRANSPLANT  08/ 24/2021   RADIAL ARTERY HARVEST Left 03/24/2017   Procedure: RADIAL ARTERY HARVEST;  Surgeon: Kerin Perna, MD;  Location: [facility] OR;  Service: Open Heart Surgery;  Laterality: Left;   TEE WITHOUT CARDIOVERSION N/A 03/24/2017   Procedure: TRANSESOPHAGEAL ECHOCARDIOGRAM (TEE);  Surgeon: Donata Clay, Theron Arista, MD;  Location: [facility] OR;  Service: Open Heart Surgery;  Laterality: N/A;   WISDOM  TOOTH EXTRACTION  yrs ago    ALLERGIES: Allergies  Allergen Reactions   Codeine Nausea And Vomiting and Other (See Comments)    Has taken this medication since liver transplant without excessive nausea and vomiting   Erythromycin Nausea And Vomiting   Fentanyl Nausea And Vomiting    Has taken this medication since liver transplant without excessive nausea and vomiting    FAMILY HISTORY:  Family History  Problem Relation Age of Onset   CAD Father    Diabetes Mellitus II Father    Heart disease Father    Heart attack Brother    Heart disease Maternal Aunt    Heart attack Paternal Grandmother    Heart attack Paternal Grandfather     SOCIAL HISTORY: Social History   Socioeconomic History   Marital status: Divorced    Spouse name: Not on file   Number of children: 3   Years  of education: 16   Highest education level: Associate degree: academic program  Occupational History   Not on file  Tobacco Use   Smoking status: Never   Smokeless tobacco: Never  Vaping Use   Vaping status: Never Used  Substance and Sexual Activity   Alcohol use: Not Currently    Alcohol/week: 1.0 standard drink of alcohol    Types: 1 Glasses of wine per week   Drug use: No   Sexual activity: Yes    Partners: Male    Birth control/protection: None  Other Topics Concern   Not on file  Social History Narrative   Not on file   Social Determinants of Health   Financial Resource Strain: Low Risk  (10/12/2019)   [facility]   Overall Financial Resource Strain (CARDIA)    Difficulty of Paying Living Expenses: Not hard at all  Food Insecurity: No Food Insecurity (05/16/2022)   Hunger Vital Sign    Worried About Running Out of Food in the Last Year: Never true    Ran Out of Food in the Last Year: Never true  Transportation Needs: No Transportation Needs (05/16/2022)   PRAPARE - Administrator, Civil Service (Medical): No    Lack of Transportation  (Non-Medical): No  Physical Activity: Inactive (03/23/2017)   Exercise Vital Sign    Days of Exercise per Week: 0 days    Minutes of Exercise per Session: 0 min  Stress: No Stress Concern Present (03/23/2017)   [employer] of Occupational Health - Occupational Stress Questionnaire    Feeling of Stress : Not at all  Social Connections: Moderately Integrated (03/23/2017)   Social Connection and Isolation Panel [NHANES]    Frequency of Communication with Friends and Family: More than three times a week    Frequency of Social Gatherings with Friends and Family: More than three times a week    Attends Religious Services: More than 4 times per year    Active Member of [employer] or Organizations: Yes    Attends Engineer, structural: More than 4 times per year    Marital Status: Divorced    MEDICATIONS:  Current Outpatient Medications  Medication Sig Dispense Refill   acetaminophen (TYLENOL) 500 MG tablet Take 1,000 mg by mouth every 8 (eight) hours as needed for mild pain.     ASPIRIN LOW DOSE 81 MG EC tablet Take 81 mg by mouth daily.     Biotin 5 MG CAPS Take 5 mg by mouth daily in the afternoon.     Calcium Citrate (CITRACAL PO) Take 1,200 mg by mouth daily.     carvedilol (COREG) 25 MG tablet Take 0.5 tablets (12.5 mg total) by mouth 2 (two) times daily with a meal. 30 tablet 1   ENVARSUS XR 1 MG TB24 Take 2 tablets (2 mg total) by mouth daily. 180 tablet 3   estradiol (VIVELLE-DOT) 0.05 MG/24HR patch Place 1 patch (0.05 mg total) onto the skin 2 (two) times a week. 24 patch 3   fluticasone (FLONASE) 50 MCG/ACT nasal spray Place 1 spray into both nostrils daily as needed for allergies or rhinitis.      loperamide (IMODIUM A-D) 2 MG tablet Take 4 mg by mouth every other day.     nitroGLYCERIN (NITROSTAT) 0.4 MG SL tablet Place 0.4 mg under the tongue every 5 (five) minutes as needed for chest pain.     pantoprazole (PROTONIX) 40 MG tablet Take 1 tablet (  40 mg total) by mouth 2  (two) times daily. (Patient taking differently: Take 40 mg by mouth daily before breakfast.) 180 tablet 3   rosuvastatin (CRESTOR) 5 MG tablet Take 1 tablet (5 mg total) by mouth daily. (Patient taking differently: Take 5 mg by mouth at bedtime.) 90 tablet 3   sucralfate (CARAFATE) 1 g tablet Take 1 tablet (1 g total) by mouth 4 (four) times daily on an empty stomach for 14 days 56 tablet 1   ursodiol (ACTIGALL) 300 MG capsule Take 1 capsule by mouth 3 times a day. (Patient taking differently: Take 300-600 mg by mouth See admin instructions. Take 300 mg by mouth in the morning and 600 mg at bedtime) 270 capsule 3   ENVARSUS XR 1 MG TB24 Take 3 tablets (3 mg total) by mouth daily. (Patient not taking: Reported on 11/26/2022) 270 tablet 3   ENVARSUS XR 4 MG TB24 Take 1 tablet (4 mg total) by mouth in the morning. 90 tablet 3   finasteride (PROSCAR) 5 MG tablet Take 1 tablet (5 mg total) by mouth daily. (Patient not taking: Reported on 11/26/2022) 90 tablet 3   metroNIDAZOLE (METROCREAM) 0.75 % cream Apply 1 Application topically to affected area 2 (two) times daily. (Patient not taking: Reported on 05/16/2022) 45 g 2   naproxen sodium (ANAPROX DS) 550 MG tablet Take 1 tablet by mouth with food or milk every 12 hours as needed (Patient not taking: Reported on 11/26/2022) 30 tablet 0   nystatin cream (MYCOSTATIN) Apply Externally Once a day as needed (Patient not taking: Reported on 11/26/2022) 30 g 0   polyethylene glycol-electrolytes (NULYTELY) 420 g solution Use as directed (Patient not taking: Reported on 05/16/2022) 4000 mL 0   Current Facility-Administered Medications  Medication Dose Route Frequency Provider Last Rate Last Admin   heparin lock flush 100 unit/mL  500 Units Intravenous Once Mannam, Praveen, MD        PHYSICAL EXAM: Vitals:   11/26/22 0904  BP: 130/80  Pulse: 75  SpO2: 98%  Weight: 188 lb 6.4 oz (85.5 kg)  Height: 5' 1.5" (1.562 m)   Body mass index is 35.02 kg/m.   General:  Well developed, well nourished female in no apparent distress.  HEENT: AT/[place], no external lesions. Hearing intact to the spoken word Eyes:. Conjunctiva clear and no icterus. Neurologic: Alert, oriented, normal speech Extremities: No pedal pitting edema Skin: Warm Psychiatric: Does not appear depressed or anxious  PERTINENT HISTORIC LABORATORY AND IMAGING STUDIES:  All pertinent laboratory results were reviewed. Please see HPI also for further details.   Lab Results  Component Value Date   ALKPHOS 53 05/16/2022   ALKPHOS 55 01/08/2022   ALKPHOS 80 09/04/2021    Latest Reference Range & Units 11/19/22 09:14  Sodium 135 - 145 mEq/L 139  Potassium 3.5 - 5.1 mEq/L 4.6  Chloride 96 - 112 mEq/L 108  CO2 19 - 32 mEq/L 21  Glucose 70 - 99 mg/dL 161 (H)  BUN 6 - 23 mg/dL 36 (H)  Creatinine 0.96 - 1.20 mg/dL 0.45 (H)  Calcium 8.6 - 10.4 mg/dL 8.4 - 40.9 mg/dL 9.5 9.4  Phosphorus 2.3 - 4.6 mg/dL 3.7  Magnesium 1.5 - 2.5 mg/dL 1.7  Albumin 3.5 - 5.2 g/dL 4.2  GFR >81.19 mL/min 43.95 (L)  VITD 30.00 - 100.00 ng/mL 25.82 (L)  (H): Data is abnormally high (L): Data is abnormally low    Latest Reference Range & Units 11/19/22 09:14  Glucose 70 - 99  mg/dL 846 (H)  PTH, Intact 16 - 77 pg/mL 44  (H): Data is abnormally high   ASSESSMENT / PLAN  1. Other osteoporosis without current pathological fracture   2. Vitamin D deficiency   3. Secondary generalized osteoporosis   4. Status post liver transplantation (HCC)   5. Stage 3b chronic kidney disease (HCC)    -Patient has osteoporosis likely related with severe liver disease.  She does have old mild L4 compression deformity on MRI scan since 2019 at least and has remained stable.  No other fragility fracture. -Patient has received Reclast 5 mg IV infusion in October 2020, October 2022 and October 2023.  3 injections so far. -Last bone density was done in July 2024 at outside facility with lowest T-score of -2.7 at femoral neck, not able  to compare from prior DEXA scan which was done in different center in 2022. -Patient has CKD with GFR in 40s.  Advised to talk with primary care provider or transplant team for nephrology referral.  Patient reports her renal function is being closely monitored by transplant team with every 3 months lab.  She has history of liver transplant in 2021.  Plan: -Will skip reclast infusion this year and will give every 2 years for now due to CKD.  Will likely do Reclast infusion in October 2025. -Will consider checking bone turnover marker CTX next year. -She has vitamin D deficiency currently taking 2000 international unit daily.  Will recommend to take total of 3000 international unit daily,  additional 1000 international unit over-the-counter vitamin D3. -Check vitamin D level in 6 months. -Continue current calcium Citracal 1200 mg daily. -Discussed about fall precaution and weightbearing exercise as tolerated.  Diagnoses and all orders for this visit:  Other osteoporosis without current pathological fracture  Vitamin D deficiency -     VITAMIN D 25 Hydroxy (Vit-D Deficiency, Fractures); Future  Secondary generalized osteoporosis  Status post liver transplantation (HCC)  Stage 3b chronic kidney disease (HCC)    DISPOSITION Follow up in clinic in 12 months suggested.  All questions answered and patient verbalized understanding of the plan.  [place] Averlee Swartz, MD [facility] [address] [place], [address] [place], [place] [address] Phone # [phone]  At least part of this note was generated using voice recognition software. Inadvertent word errors may have occurred, which were not recognized during the proofreading process.

## 2022-11-26 NOTE — Patient Instructions (Addendum)
Take additional Vit D3 1000 international units daily. Total 3000 international units  daily. Continue calcium 1200 mg daily.  Talk with primary team or transplant team for nephrology referral.   Will skip reclast this year, will plan for reclast for next year.   Vit D level in 6 months.

## 2022-12-03 ENCOUNTER — Ambulatory Visit (HOSPITAL_BASED_OUTPATIENT_CLINIC_OR_DEPARTMENT_OTHER): Payer: Commercial Managed Care - PPO | Admitting: Nurse Practitioner

## 2022-12-03 VITALS — BP 124/78 | HR 68 | Ht 61.0 in | Wt 188.7 lb

## 2022-12-03 DIAGNOSIS — Z6835 Body mass index (BMI) 35.0-35.9, adult: Secondary | ICD-10-CM

## 2022-12-03 DIAGNOSIS — E785 Hyperlipidemia, unspecified: Secondary | ICD-10-CM

## 2022-12-03 DIAGNOSIS — Z951 Presence of aortocoronary bypass graft: Secondary | ICD-10-CM | POA: Diagnosis not present

## 2022-12-03 DIAGNOSIS — Z944 Liver transplant status: Secondary | ICD-10-CM

## 2022-12-03 DIAGNOSIS — I2510 Atherosclerotic heart disease of native coronary artery without angina pectoris: Secondary | ICD-10-CM

## 2022-12-03 LAB — LIPID PANEL
Chol/HDL Ratio: 2.9 {ratio} (ref 0.0–4.4)
Cholesterol, Total: 121 mg/dL (ref 100–199)
HDL: 42 mg/dL (ref >39)
LDL Chol Calc (NIH): 57 mg/dL (ref 0–99)
Triglycerides: 125 mg/dL (ref 0–149)
VLDL Cholesterol Cal: 22 mg/dL (ref 5–40)

## 2022-12-03 LAB — LIPOPROTEIN A (LPA): Lipoprotein (a): 49.4 nmol/L (ref <75.0)

## 2022-12-03 NOTE — Patient Instructions (Addendum)
  Medication Instructions:   Your physician recommends that you continue on your current medications as directed. Please refer to the Current Medication list given to you today.   *If you need a refill on your cardiac medications before your next appointment, please call your pharmacy*   Lab Work:  1 week prior to appointment in one year.   If you have labs (blood work) drawn today and your tests are completely normal, you will receive your results only by: MyChart Message (if you have MyChart) OR A paper copy in the mail If you have any lab test that is abnormal or we need to change your treatment, we will call you to review the results.   Testing/Procedures:  None ordered.   Follow-Up: At [facility], you and your health needs are our priority.  As part of our continuing mission to provide you with exceptional heart care, we have created designated Provider Care Teams.  These Care Teams include your primary Cardiologist (physician) and Advanced Practice Providers (APPs -  Physician Assistants and Nurse Practitioners) who all work together to provide you with the care you need, when you need it.  We recommend signing up for the patient portal called "MyChart".  Sign up information is provided on this After Visit Summary.  MyChart is used to connect with patients for Virtual Visits (Telemedicine).  Patients are able to view lab/test results, encounter notes, upcoming appointments, etc.  Non-urgent messages can be sent to your provider as well.   To learn more about what you can do with MyChart, go to [email or web address].    Your next appointment:   1 year(s)  Provider:   K. Italy Hilty, MD    Other Instructions  Your physician wants you to follow-up in: 1 year with Dr. Rennis Golden.  You will receive a reminder letter in the mail two months in advance. If you don't receive a letter, please call our office to schedule the follow-up appointment.

## 2022-12-03 NOTE — Progress Notes (Unsigned)
Cardiology Office Note:  .   Date:  12/03/2022  ID:  Christine Butler, DOB 09/05/1970, MRN [id number] PCP: Tamala Ser  [facility] Providers Cardiologist:  Chrystie Nose, MD { Click to update primary MD,subspecialty MD or APP then REFRESH:1}   Patient Profile: .      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 Rennis Golden 10/12/2017.  She sees Dr. Tomie China for general cardiology.  She was having significant dyslipidemia in the setting of end-stage liver disease.  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  She ultimately underwent liver transplant 10/18/2019 at [facility]. She had marked reduction in her lipids across the board.  She follows with Dr. Brunilda Payor hepatology and liver transplant at [facility] her lipid panel at that time showed total cholesterol 137, triglycerides 190, HDL 41, and LDL 64 off Praluent.  She has a history of CABG and diastolic heart failure.  Last office visit with Dr. Rennis Golden on 12/11/2021 her LDL was elevated at 86.  Goal is less than 70.  She agreed to start rosuvastatin 5 mg daily.  Follow-up labs revealed improvement in LDL to 57, total cholesterol was 121, triglycerides 125, and HDL 42.  She had a normal lipoprotein a.       History of Present Illness: Marland Kitchen   Christine Butler is a very pleasant 52 y.o. female presents for follow-up. She reports significant weight gain since transplant three years ago. Feeling better overall, with increasing energy and overall health. However, she has gained approximately 60 pounds since transplant, which is very frustrating. Currently working with a nutritionist to address this issue. Diet is generally good, though not perfect, and working on increasing physical activity, including  resistance band exercises. Has occasional stomach issues, including GERD and delayed gastric emptying, which affects appetite. Currently increasing protein intake, particularly in the morning, as advised by their nutritionist. She also underwent a total hysterectomy last year. Currently on a low-dose estrogen patch due to osteoporosis. She reports occasional shortness of breath, particularly when walking uphill, but does not feel that this has changed significantly over the past couple of years. Has occasional heart flutters, but nothing sustained. They have no other concerning heart symptoms and no significant swelling, orthopnea, PND, presyncope or syncope.   ROS: See HPI       Studies Reviewed: .        Risk Assessment/Calculations:             Physical Exam:   VS:  BP 124/78   Pulse 68   Ht 5\' 1"  (1.549 m)   Wt 188 lb 11.2 oz (85.6 kg)   LMP 06/24/2017   SpO2 99%   BMI 35.65 kg/m    Wt Readings from Last 3 Encounters:  12/03/22 188 lb 11.2 oz (85.6 kg)  11/26/22 188 lb 6.4 oz (85.5 kg)  05/16/22 175 lb (79.4 kg)    GEN: Well nourished, well developed in no acute distress NECK: No JVD; No carotid bruits CARDIAC: RRR, no murmurs, rubs, gallops RESPIRATORY:  Clear to auscultation without rales, wheezing or rhonchi  ABDOMEN: Soft, non-tender, non-distended EXTREMITIES:  No edema; No deformity     ASSESSMENT AND PLAN: .    Dyslipidemia: LDL slightly above goal at 57, but  overall cholesterol panel improved. -Continue current lipid-lowering therapy. Check cholesterol panel in 6 months to 1 year, or sooner if labs are being drawn for other reasons.        Dispo:  1 year with Dr. Rennis Golden (she request to follow-up in [place] due to distance from [place].  Signed, Eligha Bridegroom, NP-C

## 2022-12-04 ENCOUNTER — Encounter (HOSPITAL_BASED_OUTPATIENT_CLINIC_OR_DEPARTMENT_OTHER): Payer: Self-pay | Admitting: Nurse Practitioner

## 2022-12-08 ENCOUNTER — Encounter (HOSPITAL_COMMUNITY): Payer: Self-pay

## 2022-12-08 ENCOUNTER — Other Ambulatory Visit (HOSPITAL_COMMUNITY): Payer: Self-pay

## 2022-12-08 ENCOUNTER — Other Ambulatory Visit: Payer: Self-pay

## 2022-12-08 DIAGNOSIS — Z944 Liver transplant status: Principal | ICD-10-CM

## 2022-12-08 DIAGNOSIS — Z79899 Other long term (current) drug therapy: Principal | ICD-10-CM

## 2022-12-08 MED ORDER — ENVARSUS XR 1 MG TABLET,EXTENDED RELEASE
ORAL_TABLET | Freq: Every day | ORAL | 3 refills | 90 days | Status: CP
Start: 2022-12-08 — End: ?

## 2022-12-08 MED ORDER — ENVARSUS XR 1 MG PO TB24
ORAL_TABLET | ORAL | 3 refills | Status: DC
  Filled 2022-12-08: qty 180, 90d supply, fill #0
  Filled 2023-03-03: qty 180, 90d supply, fill #1
  Filled 2023-05-20: qty 180, 90d supply, fill #2
  Filled 2023-08-17: qty 180, 90d supply, fill #3

## 2022-12-08 NOTE — Progress Notes (Signed)
Specialty Pharmacy Ongoing Clinical Assessment Note  Christine Butler [place] is a 52 y.o. female who is being followed by the specialty pharmacy service for RxSp Transplant   Patient's specialty medication(s) reviewed today: Tacrolimus (Immunosuppressive Agents)   Missed doses in the last 4 weeks: 0   Patient/Caregiver did not have any additional questions or concerns.   Therapeutic benefit summary: Patient is achieving benefit   Adverse events/side effects summary: No adverse events/side effects   Patient's therapy is appropriate to: Continue    Goals Addressed             This Visit's Progress    Reduce long-term complications       Patient is on track. Patient will maintain adherence.         Follow up:  6 months  Servando Snare Specialty Pharmacist

## 2022-12-08 NOTE — Progress Notes (Signed)
Specialty Pharmacy Refill Coordination Note  Christine Butler is a 52 y.o. female contacted today regarding refills of specialty medication(s) Tacrolimus (Immunosuppressive Agents)   Patient requested Delivery   Delivery date: 12/12/22   Verified address: [address] [place] [address]   Medication will be filled on 12/11/22 *Pending Refill Request.

## 2022-12-15 ENCOUNTER — Other Ambulatory Visit: Payer: Self-pay

## 2022-12-15 ENCOUNTER — Other Ambulatory Visit (HOSPITAL_COMMUNITY): Payer: Self-pay

## 2022-12-15 ENCOUNTER — Other Ambulatory Visit: Payer: Self-pay | Admitting: Internal Medicine

## 2022-12-15 MED ORDER — ROSUVASTATIN CALCIUM 5 MG PO TABS
5.0000 mg | ORAL_TABLET | Freq: Every day | ORAL | 3 refills | Status: AC
  Filled 2022-12-15: qty 90, 90d supply, fill #0
  Filled 2023-03-15: qty 90, 90d supply, fill #1
  Filled 2023-06-17: qty 90, 90d supply, fill #2
  Filled 2023-09-15: qty 90, 90d supply, fill #3

## 2022-12-22 DIAGNOSIS — Z944 Liver transplant status: Principal | ICD-10-CM

## 2022-12-22 DIAGNOSIS — Z79899 Other long term (current) drug therapy: Principal | ICD-10-CM

## 2023-01-05 DIAGNOSIS — Z944 Liver transplant status: Principal | ICD-10-CM

## 2023-01-05 DIAGNOSIS — Z79899 Other long term (current) drug therapy: Principal | ICD-10-CM

## 2023-01-21 ENCOUNTER — Other Ambulatory Visit (HOSPITAL_COMMUNITY): Payer: Self-pay

## 2023-01-23 ENCOUNTER — Other Ambulatory Visit: Payer: Self-pay

## 2023-01-26 ENCOUNTER — Other Ambulatory Visit (HOSPITAL_COMMUNITY): Payer: Self-pay

## 2023-01-26 ENCOUNTER — Encounter: Payer: Self-pay | Admitting: Hematology and Oncology

## 2023-02-02 ENCOUNTER — Other Ambulatory Visit: Payer: Self-pay

## 2023-02-02 ENCOUNTER — Other Ambulatory Visit (HOSPITAL_COMMUNITY): Payer: Self-pay

## 2023-02-02 MED ORDER — NYSTATIN 100000 UNIT/GM EX CREA
TOPICAL_CREAM | Freq: Every day | CUTANEOUS | 0 refills | Status: AC | PRN
  Filled 2023-02-02: qty 30, 30d supply, fill #0

## 2023-02-09 DIAGNOSIS — Z944 Liver transplant status: Secondary | ICD-10-CM | POA: Diagnosis not present

## 2023-02-09 DIAGNOSIS — Z79899 Other long term (current) drug therapy: Secondary | ICD-10-CM | POA: Diagnosis not present

## 2023-02-18 ENCOUNTER — Other Ambulatory Visit (HOSPITAL_COMMUNITY): Payer: Self-pay

## 2023-02-19 ENCOUNTER — Other Ambulatory Visit (HOSPITAL_COMMUNITY): Payer: Self-pay

## 2023-02-19 MED ORDER — PANTOPRAZOLE SODIUM 40 MG PO TBEC
40.0000 mg | DELAYED_RELEASE_TABLET | Freq: Two times a day (BID) | ORAL | 3 refills | Status: AC
  Filled 2023-06-02: qty 180, 90d supply, fill #0
  Filled 2023-08-26: qty 180, 90d supply, fill #1
  Filled 2023-11-16: qty 180, 90d supply, fill #2

## 2023-02-27 ENCOUNTER — Other Ambulatory Visit (HOSPITAL_COMMUNITY): Payer: Self-pay

## 2023-03-02 DIAGNOSIS — Z944 Liver transplant status: Principal | ICD-10-CM

## 2023-03-02 DIAGNOSIS — Z79899 Other long term (current) drug therapy: Principal | ICD-10-CM

## 2023-03-03 ENCOUNTER — Other Ambulatory Visit: Payer: Self-pay

## 2023-03-03 NOTE — Progress Notes (Signed)
 Specialty Pharmacy Refill Coordination Note  Christine Butler is a 53 y.o. female contacted today regarding refills of specialty medication(s) Tacrolimus  (Envarsus  XR)   Patient requested Delivery   Delivery date: 03/06/23   Verified address: [address]   [place]  [id number]   Medication will be filled on 03/05/23.

## 2023-03-04 DIAGNOSIS — Z944 Liver transplant status: Principal | ICD-10-CM

## 2023-03-05 ENCOUNTER — Other Ambulatory Visit: Payer: Self-pay

## 2023-03-11 DIAGNOSIS — Z6836 Body mass index (BMI) 36.0-36.9, adult: Secondary | ICD-10-CM | POA: Diagnosis not present

## 2023-03-11 DIAGNOSIS — N951 Menopausal and female climacteric states: Secondary | ICD-10-CM | POA: Diagnosis not present

## 2023-03-11 DIAGNOSIS — Z1272 Encounter for screening for malignant neoplasm of vagina: Secondary | ICD-10-CM | POA: Diagnosis not present

## 2023-03-11 DIAGNOSIS — Z01419 Encounter for gynecological examination (general) (routine) without abnormal findings: Secondary | ICD-10-CM | POA: Diagnosis not present

## 2023-03-12 ENCOUNTER — Other Ambulatory Visit (HOSPITAL_COMMUNITY): Payer: Self-pay

## 2023-03-12 MED ORDER — ESTRADIOL 0.075 MG/24HR TD PTTW
1.0000 | MEDICATED_PATCH | TRANSDERMAL | 3 refills | Status: DC
  Filled 2023-03-12: qty 24, 84d supply, fill #0
  Filled 2023-06-02: qty 24, 84d supply, fill #1
  Filled 2023-08-26: qty 24, 84d supply, fill #2
  Filled 2023-11-16: qty 24, 84d supply, fill #3

## 2023-03-16 ENCOUNTER — Other Ambulatory Visit: Payer: Self-pay

## 2023-03-27 DIAGNOSIS — Z944 Liver transplant status: Principal | ICD-10-CM

## 2023-04-10 DIAGNOSIS — K869 Disease of pancreas, unspecified: Principal | ICD-10-CM

## 2023-04-10 DIAGNOSIS — Z944 Liver transplant status: Principal | ICD-10-CM

## 2023-04-15 ENCOUNTER — Other Ambulatory Visit (HOSPITAL_COMMUNITY): Payer: Self-pay

## 2023-04-15 DIAGNOSIS — K8050 Calculus of bile duct without cholangitis or cholecystitis without obstruction: Principal | ICD-10-CM

## 2023-04-15 DIAGNOSIS — T8649 Other complications of liver transplant: Principal | ICD-10-CM

## 2023-04-15 DIAGNOSIS — Z944 Liver transplant status: Principal | ICD-10-CM

## 2023-04-15 DIAGNOSIS — K831 Obstruction of bile duct: Principal | ICD-10-CM

## 2023-04-15 MED ORDER — URSODIOL 300 MG CAPSULE
ORAL_CAPSULE | Freq: Three times a day (TID) | ORAL | 3 refills | 0.00 days
Start: 2023-04-15 — End: ?

## 2023-04-17 ENCOUNTER — Other Ambulatory Visit (HOSPITAL_COMMUNITY): Payer: Self-pay

## 2023-04-17 ENCOUNTER — Other Ambulatory Visit: Payer: Self-pay

## 2023-04-17 DIAGNOSIS — K8050 Calculus of bile duct without cholangitis or cholecystitis without obstruction: Principal | ICD-10-CM

## 2023-04-17 DIAGNOSIS — Z944 Liver transplant status: Principal | ICD-10-CM

## 2023-04-17 DIAGNOSIS — K831 Obstruction of bile duct: Principal | ICD-10-CM

## 2023-04-17 DIAGNOSIS — T8649 Other complications of liver transplant: Principal | ICD-10-CM

## 2023-04-17 MED ORDER — URSODIOL 300 MG CAPSULE
ORAL_CAPSULE | Freq: Two times a day (BID) | ORAL | 3 refills | 90.00 days | Status: CP
Start: 2023-04-17 — End: ?

## 2023-04-17 MED ORDER — URSODIOL 300 MG PO CAPS
600.0000 mg | ORAL_CAPSULE | Freq: Two times a day (BID) | ORAL | 3 refills | Status: DC
  Filled 2023-04-17: qty 360, 90d supply, fill #0
  Filled 2023-07-01 – 2023-07-08 (×2): qty 360, 90d supply, fill #1
  Filled 2023-10-11: qty 360, 90d supply, fill #2
  Filled 2024-01-07: qty 129, 32d supply, fill #3
  Filled 2024-01-07: qty 144, 36d supply, fill #3
  Filled 2024-01-08: qty 231, 58d supply, fill #3

## 2023-04-20 DIAGNOSIS — Z944 Liver transplant status: Secondary | ICD-10-CM | POA: Diagnosis not present

## 2023-04-20 DIAGNOSIS — E785 Hyperlipidemia, unspecified: Secondary | ICD-10-CM | POA: Diagnosis not present

## 2023-04-20 DIAGNOSIS — K219 Gastro-esophageal reflux disease without esophagitis: Secondary | ICD-10-CM | POA: Diagnosis not present

## 2023-04-20 DIAGNOSIS — K743 Primary biliary cirrhosis: Secondary | ICD-10-CM | POA: Diagnosis not present

## 2023-04-20 DIAGNOSIS — Z0000 Encounter for general adult medical examination without abnormal findings: Secondary | ICD-10-CM | POA: Diagnosis not present

## 2023-04-20 DIAGNOSIS — M810 Age-related osteoporosis without current pathological fracture: Secondary | ICD-10-CM | POA: Diagnosis not present

## 2023-04-20 DIAGNOSIS — I25810 Atherosclerosis of coronary artery bypass graft(s) without angina pectoris: Secondary | ICD-10-CM | POA: Diagnosis not present

## 2023-05-01 ENCOUNTER — Other Ambulatory Visit (HOSPITAL_COMMUNITY): Payer: Self-pay

## 2023-05-15 DIAGNOSIS — Z944 Liver transplant status: Principal | ICD-10-CM

## 2023-05-19 ENCOUNTER — Other Ambulatory Visit: Payer: Self-pay

## 2023-05-20 ENCOUNTER — Other Ambulatory Visit: Payer: Self-pay

## 2023-05-20 NOTE — Progress Notes (Signed)
 Specialty Pharmacy Refill Coordination Note  Christine Butler is a 53 y.o. female contacted today regarding refills of specialty medication(s) Tacrolimus (Envarsus XR)   Patient requested (Patient-Rptd) Delivery   Delivery date: (Patient-Rptd) 05/29/23   Verified address: (Patient-Rptd) [address] [place], [place]   Medication will be filled on 05/28/23.

## 2023-05-25 DIAGNOSIS — Z944 Liver transplant status: Principal | ICD-10-CM

## 2023-05-25 DIAGNOSIS — Z79899 Other long term (current) drug therapy: Principal | ICD-10-CM

## 2023-05-28 ENCOUNTER — Other Ambulatory Visit: Payer: Self-pay

## 2023-06-02 ENCOUNTER — Other Ambulatory Visit (HOSPITAL_COMMUNITY): Payer: Self-pay

## 2023-06-02 ENCOUNTER — Other Ambulatory Visit: Payer: Self-pay

## 2023-06-08 DIAGNOSIS — Z944 Liver transplant status: Secondary | ICD-10-CM | POA: Diagnosis not present

## 2023-06-08 DIAGNOSIS — Z79899 Other long term (current) drug therapy: Secondary | ICD-10-CM | POA: Diagnosis not present

## 2023-06-17 ENCOUNTER — Other Ambulatory Visit (HOSPITAL_COMMUNITY): Payer: Self-pay

## 2023-06-17 MED ORDER — AMOXICILLIN 500 MG PO CAPS
500.0000 mg | ORAL_CAPSULE | Freq: Three times a day (TID) | ORAL | 0 refills | Status: AC
  Filled 2023-06-17 (×2): qty 21, 7d supply, fill #0

## 2023-06-18 ENCOUNTER — Other Ambulatory Visit: Payer: Self-pay

## 2023-06-18 ENCOUNTER — Other Ambulatory Visit (HOSPITAL_COMMUNITY): Payer: Self-pay

## 2023-06-26 DIAGNOSIS — Z944 Liver transplant status: Principal | ICD-10-CM

## 2023-07-02 ENCOUNTER — Other Ambulatory Visit (HOSPITAL_COMMUNITY): Payer: Self-pay

## 2023-07-03 ENCOUNTER — Other Ambulatory Visit: Payer: Self-pay

## 2023-07-08 ENCOUNTER — Other Ambulatory Visit (HOSPITAL_COMMUNITY): Payer: Self-pay

## 2023-07-08 DIAGNOSIS — L304 Erythema intertrigo: Secondary | ICD-10-CM | POA: Diagnosis not present

## 2023-07-08 DIAGNOSIS — Z944 Liver transplant status: Secondary | ICD-10-CM | POA: Diagnosis not present

## 2023-07-08 DIAGNOSIS — L821 Other seborrheic keratosis: Secondary | ICD-10-CM | POA: Diagnosis not present

## 2023-07-08 DIAGNOSIS — L813 Cafe au lait spots: Secondary | ICD-10-CM | POA: Diagnosis not present

## 2023-07-08 DIAGNOSIS — L814 Other melanin hyperpigmentation: Secondary | ICD-10-CM | POA: Diagnosis not present

## 2023-07-08 DIAGNOSIS — L578 Other skin changes due to chronic exposure to nonionizing radiation: Secondary | ICD-10-CM | POA: Diagnosis not present

## 2023-07-08 DIAGNOSIS — L719 Rosacea, unspecified: Secondary | ICD-10-CM | POA: Diagnosis not present

## 2023-08-12 ENCOUNTER — Other Ambulatory Visit: Payer: Self-pay

## 2023-08-12 DIAGNOSIS — R6882 Decreased libido: Secondary | ICD-10-CM | POA: Diagnosis not present

## 2023-08-12 DIAGNOSIS — R87620 Atypical squamous cells of undetermined significance on cytologic smear of vagina (ASC-US): Secondary | ICD-10-CM | POA: Diagnosis not present

## 2023-08-12 DIAGNOSIS — R87811 Vaginal high risk human papillomavirus (HPV) DNA test positive: Secondary | ICD-10-CM | POA: Diagnosis not present

## 2023-08-17 ENCOUNTER — Other Ambulatory Visit (HOSPITAL_COMMUNITY): Payer: Self-pay

## 2023-08-17 ENCOUNTER — Other Ambulatory Visit: Payer: Self-pay

## 2023-08-17 NOTE — Progress Notes (Signed)
 Specialty Pharmacy Refill Coordination Note  Spoke with Marinda, Leeloo L (Self).   Christine Butler is a 52 y.o. female contacted today regarding refills of specialty medication(s) Tacrolimus  (Envarsus  XR)  Patient requested: Delivery   Delivery date: 08/21/23   Verified address: [address]  [place] [address]  Medication will be filled on 08/20/23.

## 2023-08-26 ENCOUNTER — Other Ambulatory Visit: Payer: Self-pay

## 2023-08-31 ENCOUNTER — Other Ambulatory Visit: Payer: Self-pay

## 2023-08-31 DIAGNOSIS — Z944 Liver transplant status: Principal | ICD-10-CM

## 2023-08-31 DIAGNOSIS — K862 Cyst of pancreas: Principal | ICD-10-CM

## 2023-09-07 ENCOUNTER — Other Ambulatory Visit: Payer: Self-pay

## 2023-09-07 NOTE — Progress Notes (Signed)
 Specialty Pharmacy Ongoing Clinical Assessment Note  Christine Butler is a 53 y.o. female who is being followed by the specialty pharmacy service for RxSp Transplant   Patient's specialty medication(s) reviewed today: Tacrolimus  (Envarsus  XR)   Missed doses in the last 4 weeks: 0   Patient/Caregiver did not have any additional questions or concerns.   Therapeutic benefit summary: Patient is achieving benefit   Adverse events/side effects summary: No adverse events/side effects   Patient's therapy is appropriate to: Continue    Goals Addressed             This Visit's Progress    Reduce long-term complications   On track    Patient is on track. Patient will maintain adherence.         Follow up: 12 months  [facility]

## 2023-09-08 ENCOUNTER — Other Ambulatory Visit (HOSPITAL_COMMUNITY): Payer: Self-pay | Admitting: Transplant Hepatology

## 2023-09-08 DIAGNOSIS — Z944 Liver transplant status: Principal | ICD-10-CM

## 2023-09-08 DIAGNOSIS — Z79899 Other long term (current) drug therapy: Principal | ICD-10-CM

## 2023-09-08 DIAGNOSIS — K869 Disease of pancreas, unspecified: Secondary | ICD-10-CM

## 2023-09-10 DIAGNOSIS — Z944 Liver transplant status: Principal | ICD-10-CM

## 2023-09-15 ENCOUNTER — Other Ambulatory Visit: Payer: Self-pay

## 2023-09-16 ENCOUNTER — Other Ambulatory Visit (HOSPITAL_COMMUNITY): Payer: Self-pay | Admitting: Transplant Hepatology

## 2023-09-16 ENCOUNTER — Ambulatory Visit (HOSPITAL_COMMUNITY)
Admission: RE | Admit: 2023-09-16 | Discharge: 2023-09-16 | Disposition: A | Discharge status: Home / Self Care | Source: Ambulatory Visit | Attending: Transplant Hepatology | Admitting: Transplant Hepatology

## 2023-09-16 DIAGNOSIS — Z944 Liver transplant status: Secondary | ICD-10-CM | POA: Diagnosis not present

## 2023-09-16 DIAGNOSIS — K869 Disease of pancreas, unspecified: Secondary | ICD-10-CM | POA: Insufficient documentation

## 2023-09-16 DIAGNOSIS — K862 Cyst of pancreas: Secondary | ICD-10-CM | POA: Diagnosis not present

## 2023-09-16 DIAGNOSIS — Z9049 Acquired absence of other specified parts of digestive tract: Secondary | ICD-10-CM | POA: Diagnosis not present

## 2023-09-16 DIAGNOSIS — K439 Ventral hernia without obstruction or gangrene: Secondary | ICD-10-CM | POA: Diagnosis not present

## 2023-09-16 MED ORDER — GADOBUTROL 1 MMOL/ML IV SOLN
8.0000 mL | Freq: Once | INTRAVENOUS | Status: AC | PRN
  Administered 2023-09-16: 8 mL via INTRAVENOUS

## 2023-09-24 DIAGNOSIS — Z944 Liver transplant status: Principal | ICD-10-CM

## 2023-09-30 DIAGNOSIS — Z79899 Other long term (current) drug therapy: Secondary | ICD-10-CM | POA: Diagnosis not present

## 2023-09-30 DIAGNOSIS — Z944 Liver transplant status: Secondary | ICD-10-CM | POA: Diagnosis not present

## 2023-10-05 DIAGNOSIS — Z79899 Other long term (current) drug therapy: Principal | ICD-10-CM

## 2023-10-05 DIAGNOSIS — Z944 Liver transplant status: Principal | ICD-10-CM

## 2023-10-12 ENCOUNTER — Other Ambulatory Visit: Payer: Self-pay

## 2023-10-12 ENCOUNTER — Other Ambulatory Visit (HOSPITAL_BASED_OUTPATIENT_CLINIC_OR_DEPARTMENT_OTHER): Payer: Self-pay

## 2023-10-12 ENCOUNTER — Other Ambulatory Visit (HOSPITAL_COMMUNITY): Payer: Self-pay

## 2023-10-12 ENCOUNTER — Encounter: Payer: Self-pay | Admitting: Hematology and Oncology

## 2023-10-12 ENCOUNTER — Ambulatory Visit: Admit: 2023-10-12 | Discharge: 2023-10-13 | Payer: PRIVATE HEALTH INSURANCE

## 2023-10-12 DIAGNOSIS — Z944 Liver transplant status: Principal | ICD-10-CM

## 2023-10-12 DIAGNOSIS — K580 Irritable bowel syndrome with diarrhea: Principal | ICD-10-CM

## 2023-10-12 DIAGNOSIS — R197 Diarrhea, unspecified: Principal | ICD-10-CM

## 2023-10-12 DIAGNOSIS — Z79899 Other long term (current) drug therapy: Principal | ICD-10-CM

## 2023-10-12 DIAGNOSIS — R7989 Other specified abnormal findings of blood chemistry: Principal | ICD-10-CM

## 2023-10-12 MED ORDER — ENVARSUS XR 1 MG TABLET,EXTENDED RELEASE
ORAL_TABLET | Freq: Every day | ORAL | 3 refills | 90.00000 days | Status: CP
Start: 2023-10-12 — End: ?

## 2023-10-12 MED ORDER — RIFAXIMIN 550 MG TABLET
ORAL_TABLET | Freq: Three times a day (TID) | ORAL | 0 refills | 14.00000 days | Status: CP
Start: 2023-10-12 — End: 2023-10-26

## 2023-10-12 MED ORDER — XIFAXAN 550 MG PO TABS
550.0000 mg | ORAL_TABLET | Freq: Three times a day (TID) | ORAL | 0 refills | Status: AC
  Filled 2023-10-12 – 2023-11-13 (×5): qty 42, 14d supply, fill #0

## 2023-10-12 MED ORDER — ENVARSUS XR 1 MG PO TB24
1.0000 mg | ORAL_TABLET | Freq: Every day | ORAL | 3 refills | Status: AC
  Filled 2023-11-23 – 2024-02-02 (×4): qty 90, 90d supply, fill #0

## 2023-10-13 ENCOUNTER — Other Ambulatory Visit (HOSPITAL_COMMUNITY): Payer: Self-pay

## 2023-10-14 ENCOUNTER — Encounter: Payer: Self-pay | Admitting: Hematology and Oncology

## 2023-10-14 ENCOUNTER — Other Ambulatory Visit (HOSPITAL_BASED_OUTPATIENT_CLINIC_OR_DEPARTMENT_OTHER): Payer: Self-pay

## 2023-10-14 ENCOUNTER — Other Ambulatory Visit (HOSPITAL_COMMUNITY): Payer: Self-pay

## 2023-10-14 DIAGNOSIS — K580 Irritable bowel syndrome with diarrhea: Principal | ICD-10-CM

## 2023-10-28 DIAGNOSIS — Z79899 Other long term (current) drug therapy: Secondary | ICD-10-CM | POA: Diagnosis not present

## 2023-10-28 DIAGNOSIS — Z944 Liver transplant status: Secondary | ICD-10-CM | POA: Diagnosis not present

## 2023-10-28 DIAGNOSIS — R7989 Other specified abnormal findings of blood chemistry: Secondary | ICD-10-CM | POA: Diagnosis not present

## 2023-11-02 DIAGNOSIS — Z79899 Other long term (current) drug therapy: Principal | ICD-10-CM

## 2023-11-02 DIAGNOSIS — Z944 Liver transplant status: Principal | ICD-10-CM

## 2023-11-13 ENCOUNTER — Other Ambulatory Visit (HOSPITAL_COMMUNITY): Payer: Self-pay

## 2023-11-13 DIAGNOSIS — Z944 Liver transplant status: Principal | ICD-10-CM

## 2023-11-16 ENCOUNTER — Other Ambulatory Visit: Payer: Self-pay

## 2023-11-17 ENCOUNTER — Telehealth: Payer: Self-pay | Admitting: Internal Medicine

## 2023-11-17 DIAGNOSIS — Z944 Liver transplant status: Principal | ICD-10-CM

## 2023-11-17 DIAGNOSIS — I2510 Atherosclerotic heart disease of native coronary artery without angina pectoris: Secondary | ICD-10-CM

## 2023-11-17 DIAGNOSIS — E785 Hyperlipidemia, unspecified: Secondary | ICD-10-CM

## 2023-11-17 DIAGNOSIS — Z951 Presence of aortocoronary bypass graft: Secondary | ICD-10-CM

## 2023-11-17 NOTE — Telephone Encounter (Signed)
 Please ask patient to get fasting lipid panel and CMET prior to appointment and place orders.  Thank you.

## 2023-11-17 NOTE — Telephone Encounter (Signed)
  Patient is scheduled for a lipid follow up on 12/09/23. There are no lab orders for her lipids. Please place orders

## 2023-11-18 ENCOUNTER — Other Ambulatory Visit: Payer: Self-pay

## 2023-11-19 ENCOUNTER — Other Ambulatory Visit: Payer: Self-pay

## 2023-11-23 ENCOUNTER — Encounter (HOSPITAL_BASED_OUTPATIENT_CLINIC_OR_DEPARTMENT_OTHER): Payer: Self-pay | Admitting: *Deleted

## 2023-11-23 ENCOUNTER — Other Ambulatory Visit: Payer: Self-pay

## 2023-11-25 ENCOUNTER — Ambulatory Visit: Payer: Commercial Managed Care - PPO | Admitting: Endocrinology

## 2023-11-30 DIAGNOSIS — Z944 Liver transplant status: Principal | ICD-10-CM

## 2023-11-30 DIAGNOSIS — Z79899 Other long term (current) drug therapy: Principal | ICD-10-CM

## 2023-12-07 ENCOUNTER — Telehealth (HOSPITAL_BASED_OUTPATIENT_CLINIC_OR_DEPARTMENT_OTHER): Payer: Self-pay

## 2023-12-07 NOTE — Telephone Encounter (Signed)
 LM reminding of fasting labs needed prior to appt with Christine Bane, NP on 12/09/23. Christine Butler, CMA also sent a MyChart msg reminding her of the labs needed on 11/23/23. Informed her of [facility] locations and [facility] labs hours.

## 2023-12-09 ENCOUNTER — Encounter (HOSPITAL_BASED_OUTPATIENT_CLINIC_OR_DEPARTMENT_OTHER): Admitting: Nurse Practitioner

## 2023-12-14 ENCOUNTER — Other Ambulatory Visit (HOSPITAL_COMMUNITY): Payer: Self-pay

## 2023-12-14 ENCOUNTER — Encounter (HOSPITAL_BASED_OUTPATIENT_CLINIC_OR_DEPARTMENT_OTHER): Payer: Self-pay

## 2023-12-15 ENCOUNTER — Other Ambulatory Visit (HOSPITAL_COMMUNITY): Payer: Self-pay

## 2023-12-15 NOTE — Progress Notes (Deleted)
 Cardiology Office Note:  .   Date:  12/15/2023  ID:  Christine Butler, DOB 1970-10-31, MRN [id number] PCP: Christine Butler (Inactive)   [facility] Providers Cardiologist:  Christine Butler    Patient Profile: .      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 Seen by Christine Butler for general cardiology.  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  She ultimately underwent liver transplant 10/18/2019 at [facility]. She had marked reduction in her lipids across the board.  She follows with Christine Butler Butler and liver transplant at [facility]. Lipid panel at that time showed total cholesterol 137, triglycerides 190, HDL 41, and LDL 64 off Praluent .  She has a history of CABG and diastolic heart failure.  Seen by Christine Butler on 12/11/2021 with LDL elevated at 86. She agreed to start rosuvastatin  5 mg daily.  Follow-up labs revealed improvement in LDL to 57, total cholesterol was 121, triglycerides 125, and HDL 42.  She had a normal lipoprotein a.  Seen by me on 12/03/22 for follow-up. Reported significant weight gain since transplant three years ago. Feeling better overall, with increasing energy and overall health. However, she has gained approximately 60 pounds since transplant, which is very frustrating. Currently working with a nutritionist. Diet is generally good, working on increasing physical activity, including resistance band exercises. Has occasional stomach issues, including GERD and delayed gastric emptying, which affects appetite. Currently increasing protein intake, particularly in the morning, as advised by nutritionist. Also underwent a  total hysterectomy last year. Currently on a low-dose estrogen patch due to osteoporosis. Reported occasional shortness of breath, particularly when walking uphill, but does not feel that this has changed significantly over the past couple of years. Has occasional heart flutters, but nothing sustained. No chest pain, edema, orthopnea, PND, presyncope or syncope. She sees multiple providers for Butler, rheumatology, and would like to see Christine Butler for cardiology due to needing a provider in [place]. Lipids were well controlled and she was advised to continue rosuvastatin  5 mg daily.        History of Present Illness: Christine   Christine Butler is a very pleasant 53 y.o. female presents for follow-up.   ROS: See HPI       Studies Reviewed: .        Risk Assessment/Calculations:     No BP recorded.  {Refresh Note OR Click here to enter BP  :1}***       Physical Exam:   VS:  LMP 06/24/2017    Wt Readings from Last 3 Encounters:  12/03/22 188 lb 11.2 oz (85.6 kg)  11/26/22 188 lb 6.4 oz (85.5 kg)  05/16/22 175 lb (79.4 kg)    GEN: Well nourished, well developed in no acute distress NECK: No JVD; No carotid bruits CARDIAC: RRR, no murmurs, rubs, gallops RESPIRATORY:  Clear to auscultation without rales, wheezing or rhonchi  ABDOMEN: Soft, non-tender, non-distended EXTREMITIES:  No edema; No deformity     ASSESSMENT AND PLAN: .    Dyslipipidemia LDL goal < 55: LDL slightly above goal at 57, but overall cholesterol panel improved on low dose rosuvastatin . Advised her to continue rosuvastatin  along  with healthy diet and regular exercise. Plan to recheck cholesterol panel in 6-12 months.   CAD: History of CABG 2019. She denies chest pain, dyspnea, or other symptoms concerning for angina. She is euvolemic on exam. No indication for further ischemic evaluation at this time. No bleeding concern. Continue aspirin , carvedilol , rosuvastatin .   Obesity: Significant weight gain since transplant and  hysterectomy. Currently working with a nutritionist to address this issue. Continue working with nutritionist to optimize diet and increase protein intake. Encouraged regular exercise, including resistance training.  History of liver transplant: She reports overall improvement in energy and health status since transplant. No concerning symptoms reported. Management per [facility] transplant team.       Dispo:  ***  Signed, Rosaline Bane, NP-C

## 2023-12-16 ENCOUNTER — Other Ambulatory Visit (HOSPITAL_COMMUNITY): Payer: Self-pay

## 2023-12-16 ENCOUNTER — Encounter (HOSPITAL_BASED_OUTPATIENT_CLINIC_OR_DEPARTMENT_OTHER): Admitting: Nurse Practitioner

## 2023-12-16 ENCOUNTER — Other Ambulatory Visit: Payer: Self-pay

## 2023-12-18 ENCOUNTER — Other Ambulatory Visit: Payer: Self-pay

## 2023-12-21 ENCOUNTER — Other Ambulatory Visit: Payer: Self-pay | Admitting: Medical Genetics

## 2023-12-21 DIAGNOSIS — Z006 Encounter for examination for normal comparison and control in clinical research program: Secondary | ICD-10-CM

## 2023-12-23 ENCOUNTER — Ambulatory Visit: Admitting: Endocrinology

## 2023-12-23 DIAGNOSIS — Z944 Liver transplant status: Principal | ICD-10-CM

## 2023-12-23 DIAGNOSIS — K862 Cyst of pancreas: Principal | ICD-10-CM

## 2023-12-28 DIAGNOSIS — Z79899 Other long term (current) drug therapy: Principal | ICD-10-CM

## 2023-12-28 DIAGNOSIS — Z944 Liver transplant status: Principal | ICD-10-CM

## 2024-01-07 ENCOUNTER — Other Ambulatory Visit: Payer: Self-pay

## 2024-01-07 ENCOUNTER — Other Ambulatory Visit (HOSPITAL_BASED_OUTPATIENT_CLINIC_OR_DEPARTMENT_OTHER): Payer: Self-pay

## 2024-01-07 ENCOUNTER — Other Ambulatory Visit (HOSPITAL_COMMUNITY): Payer: Self-pay

## 2024-01-08 ENCOUNTER — Other Ambulatory Visit: Payer: Self-pay

## 2024-01-08 ENCOUNTER — Other Ambulatory Visit (HOSPITAL_COMMUNITY): Payer: Self-pay

## 2024-01-11 ENCOUNTER — Other Ambulatory Visit (HOSPITAL_COMMUNITY): Payer: Self-pay

## 2024-01-13 ENCOUNTER — Other Ambulatory Visit: Payer: Self-pay

## 2024-01-20 DIAGNOSIS — Z79899 Other long term (current) drug therapy: Secondary | ICD-10-CM | POA: Diagnosis not present

## 2024-01-20 DIAGNOSIS — Z944 Liver transplant status: Secondary | ICD-10-CM | POA: Diagnosis not present

## 2024-01-25 DIAGNOSIS — Z944 Liver transplant status: Principal | ICD-10-CM

## 2024-01-25 DIAGNOSIS — Z79899 Other long term (current) drug therapy: Principal | ICD-10-CM

## 2024-01-27 LAB — GENECONNECT MOLECULAR SCREEN: Genetic Analysis Overall Interpretation: NEGATIVE

## 2024-01-29 DIAGNOSIS — Z944 Liver transplant status: Principal | ICD-10-CM

## 2024-02-02 ENCOUNTER — Other Ambulatory Visit: Payer: Self-pay

## 2024-02-02 ENCOUNTER — Telehealth: Payer: Self-pay

## 2024-02-02 ENCOUNTER — Other Ambulatory Visit (HOSPITAL_COMMUNITY): Payer: Self-pay

## 2024-02-02 NOTE — Telephone Encounter (Signed)
 Pharmacy Patient Advocate Encounter  Received notification from [facility] that Prior Authorization for Envarsus  XR has been APPROVED from 02/02/24 to 01/31/25   PA #/Case ID/Reference #: [id number]

## 2024-02-02 NOTE — Telephone Encounter (Signed)
 Pharmacy Patient Advocate Encounter  Insurance verification completed.   The patient is insured through [facility]   Ran test claim for Envarsus  XR. Currently a quantity of 90 is a 90 day supply and the co-pay is $0.   This test claim was processed through [facility]- copay amounts may vary at other pharmacies due to pharmacy/plan contracts, or as the patient moves through the different stages of their insurance plan.

## 2024-02-02 NOTE — Telephone Encounter (Signed)
 Pharmacy Patient Advocate Encounter   Received notification from [facility] Pharm[facility] that prior authorization for Envarsus  XR is required/requested.   Insurance verification completed.   The patient is insured through [facility].   Per test claim: PA required; PA submitted to above mentioned insurance via Latent Key/confirmation #/EOC [id number] Status is pending

## 2024-02-03 ENCOUNTER — Other Ambulatory Visit: Payer: Self-pay

## 2024-02-04 ENCOUNTER — Other Ambulatory Visit (HOSPITAL_COMMUNITY): Payer: Self-pay

## 2024-02-04 ENCOUNTER — Other Ambulatory Visit: Payer: Self-pay

## 2024-02-04 MED ORDER — ESTRADIOL 0.075 MG/24HR TD PTTW
1.0000 | MEDICATED_PATCH | TRANSDERMAL | 0 refills | Status: AC
  Filled 2024-02-04: qty 24, 84d supply, fill #0

## 2024-02-05 ENCOUNTER — Other Ambulatory Visit: Payer: Self-pay

## 2024-02-22 DIAGNOSIS — Z944 Liver transplant status: Principal | ICD-10-CM

## 2024-02-22 DIAGNOSIS — Z79899 Other long term (current) drug therapy: Principal | ICD-10-CM

## 2024-03-03 DIAGNOSIS — Z944 Liver transplant status: Principal | ICD-10-CM

## 2024-03-16 DIAGNOSIS — Z944 Liver transplant status: Principal | ICD-10-CM

## 2024-03-17 DIAGNOSIS — Z944 Liver transplant status: Principal | ICD-10-CM

## 2024-03-21 DIAGNOSIS — Z79899 Other long term (current) drug therapy: Secondary | ICD-10-CM

## 2024-03-21 DIAGNOSIS — Z944 Liver transplant status: Principal | ICD-10-CM

## 2024-03-29 ENCOUNTER — Other Ambulatory Visit (HOSPITAL_COMMUNITY): Payer: Self-pay

## 2024-03-29 ENCOUNTER — Encounter: Payer: Self-pay | Admitting: Hematology and Oncology

## 2024-03-29 DIAGNOSIS — K8050 Calculus of bile duct without cholangitis or cholecystitis without obstruction: Secondary | ICD-10-CM

## 2024-03-29 DIAGNOSIS — Z944 Liver transplant status: Principal | ICD-10-CM

## 2024-03-29 DIAGNOSIS — K831 Obstruction of bile duct: Secondary | ICD-10-CM

## 2024-03-29 DIAGNOSIS — T8649 Other complications of liver transplant: Secondary | ICD-10-CM

## 2024-03-29 MED ORDER — URSODIOL 300 MG CAPSULE
ORAL_CAPSULE | Freq: Two times a day (BID) | ORAL | 3 refills | 0.00000 days
Start: 2024-03-29 — End: ?

## 2024-03-30 ENCOUNTER — Other Ambulatory Visit: Payer: Self-pay

## 2024-03-30 ENCOUNTER — Other Ambulatory Visit (HOSPITAL_COMMUNITY): Payer: Self-pay

## 2024-03-30 DIAGNOSIS — Z944 Liver transplant status: Principal | ICD-10-CM

## 2024-03-30 MED ORDER — URSODIOL 300 MG CAPSULE
ORAL_CAPSULE | Freq: Two times a day (BID) | ORAL | 3 refills | 90.00000 days | Status: CP
Start: 2024-03-30 — End: ?

## 2024-03-30 MED ORDER — URSODIOL 300 MG PO CAPS
600.0000 mg | ORAL_CAPSULE | Freq: Two times a day (BID) | ORAL | 3 refills | Status: AC
  Filled 2024-03-30: qty 360, 90d supply, fill #0
